# Patient Record
Sex: Female | Born: 1955 | Race: White | Hispanic: No | Marital: Married | Smoking: Former smoker
Health system: Southern US, Community
[De-identification: ages and names within clinical notes are randomized; demographics above are authoritative.]

## PROBLEM LIST (undated history)

## (undated) DIAGNOSIS — K219 Gastro-esophageal reflux disease without esophagitis: Secondary | ICD-10-CM

## (undated) DIAGNOSIS — E785 Hyperlipidemia, unspecified: Secondary | ICD-10-CM

## (undated) DIAGNOSIS — R42 Dizziness and giddiness: Secondary | ICD-10-CM

## (undated) DIAGNOSIS — T7840XA Allergy, unspecified, initial encounter: Secondary | ICD-10-CM

## (undated) DIAGNOSIS — N83209 Unspecified ovarian cyst, unspecified side: Secondary | ICD-10-CM

## (undated) DIAGNOSIS — L03818 Cellulitis of other sites: Secondary | ICD-10-CM

## (undated) DIAGNOSIS — G43009 Migraine without aura, not intractable, without status migrainosus: Secondary | ICD-10-CM

## (undated) DIAGNOSIS — T4145XA Adverse effect of unspecified anesthetic, initial encounter: Secondary | ICD-10-CM

## (undated) DIAGNOSIS — L409 Psoriasis, unspecified: Secondary | ICD-10-CM

## (undated) DIAGNOSIS — E119 Type 2 diabetes mellitus without complications: Secondary | ICD-10-CM

## (undated) DIAGNOSIS — A0472 Enterocolitis due to Clostridium difficile, not specified as recurrent: Secondary | ICD-10-CM

## (undated) DIAGNOSIS — I779 Disorder of arteries and arterioles, unspecified: Secondary | ICD-10-CM

## (undated) DIAGNOSIS — N189 Chronic kidney disease, unspecified: Secondary | ICD-10-CM

## (undated) DIAGNOSIS — Z87898 Personal history of other specified conditions: Secondary | ICD-10-CM

## (undated) DIAGNOSIS — M549 Dorsalgia, unspecified: Secondary | ICD-10-CM

## (undated) DIAGNOSIS — I498 Other specified cardiac arrhythmias: Secondary | ICD-10-CM

## (undated) DIAGNOSIS — Z5189 Encounter for other specified aftercare: Secondary | ICD-10-CM

## (undated) DIAGNOSIS — G8929 Other chronic pain: Secondary | ICD-10-CM

## (undated) DIAGNOSIS — N183 Chronic kidney disease, stage 3 (moderate): Secondary | ICD-10-CM

## (undated) DIAGNOSIS — L02818 Cutaneous abscess of other sites: Secondary | ICD-10-CM

## (undated) DIAGNOSIS — E039 Hypothyroidism, unspecified: Secondary | ICD-10-CM

## (undated) DIAGNOSIS — F329 Major depressive disorder, single episode, unspecified: Secondary | ICD-10-CM

## (undated) DIAGNOSIS — M1990 Unspecified osteoarthritis, unspecified site: Secondary | ICD-10-CM

## (undated) DIAGNOSIS — E538 Deficiency of other specified B group vitamins: Secondary | ICD-10-CM

## (undated) DIAGNOSIS — N179 Acute kidney failure, unspecified: Secondary | ICD-10-CM

## (undated) DIAGNOSIS — K7460 Unspecified cirrhosis of liver: Secondary | ICD-10-CM

## (undated) DIAGNOSIS — E669 Obesity, unspecified: Secondary | ICD-10-CM

## (undated) DIAGNOSIS — G5600 Carpal tunnel syndrome, unspecified upper limb: Secondary | ICD-10-CM

## (undated) DIAGNOSIS — I10 Essential (primary) hypertension: Secondary | ICD-10-CM

## (undated) DIAGNOSIS — M545 Low back pain, unspecified: Secondary | ICD-10-CM

## (undated) DIAGNOSIS — T8859XA Other complications of anesthesia, initial encounter: Secondary | ICD-10-CM

## (undated) DIAGNOSIS — J309 Allergic rhinitis, unspecified: Secondary | ICD-10-CM

## (undated) DIAGNOSIS — F411 Generalized anxiety disorder: Secondary | ICD-10-CM

## (undated) DIAGNOSIS — R1013 Epigastric pain: Secondary | ICD-10-CM

## (undated) DIAGNOSIS — R079 Chest pain, unspecified: Secondary | ICD-10-CM

## (undated) DIAGNOSIS — I639 Cerebral infarction, unspecified: Secondary | ICD-10-CM

## (undated) DIAGNOSIS — I739 Peripheral vascular disease, unspecified: Secondary | ICD-10-CM

## (undated) DIAGNOSIS — S8010XA Contusion of unspecified lower leg, initial encounter: Secondary | ICD-10-CM

## (undated) DIAGNOSIS — Z8679 Personal history of other diseases of the circulatory system: Secondary | ICD-10-CM

## (undated) DIAGNOSIS — G43909 Migraine, unspecified, not intractable, without status migrainosus: Secondary | ICD-10-CM

## (undated) HISTORY — DX: Gastro-esophageal reflux disease without esophagitis: K21.9

## (undated) HISTORY — DX: Chest pain, unspecified: R07.9

## (undated) HISTORY — DX: Personal history of other specified conditions: Z87.898

## (undated) HISTORY — DX: Epigastric pain: R10.13

## (undated) HISTORY — DX: Cellulitis of other sites: L03.818

## (undated) HISTORY — DX: Chronic kidney disease, unspecified: N18.9

## (undated) HISTORY — DX: Personal history of other diseases of the circulatory system: Z86.79

## (undated) HISTORY — DX: Type 2 diabetes mellitus without complications: E11.9

## (undated) HISTORY — DX: Unspecified cirrhosis of liver: K74.60

## (undated) HISTORY — DX: Deficiency of other specified B group vitamins: E53.8

## (undated) HISTORY — DX: Enterocolitis due to Clostridium difficile, not specified as recurrent: A04.72

## (undated) HISTORY — DX: Allergy, unspecified, initial encounter: T78.40XA

## (undated) HISTORY — DX: Psoriasis, unspecified: L40.9

## (undated) HISTORY — PX: CHOLECYSTECTOMY: SHX55

## (undated) HISTORY — DX: Migraine without aura, not intractable, without status migrainosus: G43.009

## (undated) HISTORY — DX: Essential (primary) hypertension: I10

## (undated) HISTORY — DX: Hyperlipidemia, unspecified: E78.5

## (undated) HISTORY — DX: Hypothyroidism, unspecified: E03.9

## (undated) HISTORY — DX: Cutaneous abscess of other sites: L02.818

## (undated) HISTORY — DX: Encounter for other specified aftercare: Z51.89

## (undated) HISTORY — DX: Cerebral infarction, unspecified: I63.9

## (undated) HISTORY — DX: Generalized anxiety disorder: F41.1

## (undated) HISTORY — DX: Carpal tunnel syndrome, unspecified upper limb: G56.00

## (undated) HISTORY — DX: Disorder of arteries and arterioles, unspecified: I77.9

## (undated) HISTORY — DX: Chronic kidney disease, stage 3 (moderate): N18.3

## (undated) HISTORY — PX: COLONOSCOPY: SHX174

## (undated) HISTORY — DX: Unspecified ovarian cyst, unspecified side: N83.209

## (undated) HISTORY — DX: Other specified cardiac arrhythmias: I49.8

## (undated) HISTORY — DX: Major depressive disorder, single episode, unspecified: F32.9

## (undated) HISTORY — DX: Migraine, unspecified, not intractable, without status migrainosus: G43.909

## (undated) HISTORY — DX: Peripheral vascular disease, unspecified: I73.9

## (undated) HISTORY — DX: Dizziness and giddiness: R42

## (undated) HISTORY — DX: Dorsalgia, unspecified: M54.9

## (undated) HISTORY — DX: Contusion of unspecified lower leg, initial encounter: S80.10XA

## (undated) HISTORY — DX: Allergic rhinitis, unspecified: J30.9

## (undated) HISTORY — DX: Obesity, unspecified: E66.9

---

## 1997-05-21 ENCOUNTER — Emergency Department (HOSPITAL_COMMUNITY): Admission: EM | Admit: 1997-05-21 | Discharge: 1997-05-21 | Payer: Self-pay | Admitting: Emergency Medicine

## 1997-11-01 ENCOUNTER — Other Ambulatory Visit: Admission: RE | Admit: 1997-11-01 | Discharge: 1997-11-01 | Payer: Self-pay | Admitting: *Deleted

## 1997-11-01 ENCOUNTER — Ambulatory Visit: Admission: RE | Admit: 1997-11-01 | Discharge: 1997-11-01 | Payer: Self-pay | Admitting: Internal Medicine

## 1998-11-02 ENCOUNTER — Other Ambulatory Visit: Admission: RE | Admit: 1998-11-02 | Discharge: 1998-11-02 | Payer: Self-pay | Admitting: *Deleted

## 1998-11-03 ENCOUNTER — Emergency Department (HOSPITAL_COMMUNITY): Admission: EM | Admit: 1998-11-03 | Discharge: 1998-11-03 | Payer: Self-pay | Admitting: Emergency Medicine

## 1999-11-23 ENCOUNTER — Other Ambulatory Visit: Admission: RE | Admit: 1999-11-23 | Discharge: 1999-11-23 | Payer: Self-pay | Admitting: *Deleted

## 2000-12-10 ENCOUNTER — Other Ambulatory Visit: Admission: RE | Admit: 2000-12-10 | Discharge: 2000-12-10 | Payer: Self-pay | Admitting: *Deleted

## 2001-04-09 ENCOUNTER — Encounter: Payer: Self-pay | Admitting: *Deleted

## 2001-04-09 ENCOUNTER — Inpatient Hospital Stay (HOSPITAL_COMMUNITY): Admission: EM | Admit: 2001-04-09 | Discharge: 2001-04-10 | Payer: Self-pay | Admitting: *Deleted

## 2001-12-31 ENCOUNTER — Other Ambulatory Visit: Admission: RE | Admit: 2001-12-31 | Discharge: 2001-12-31 | Payer: Self-pay | Admitting: *Deleted

## 2002-01-13 ENCOUNTER — Emergency Department (HOSPITAL_COMMUNITY): Admission: EM | Admit: 2002-01-13 | Discharge: 2002-01-13 | Payer: Self-pay | Admitting: *Deleted

## 2002-01-13 ENCOUNTER — Encounter: Payer: Self-pay | Admitting: *Deleted

## 2002-01-28 ENCOUNTER — Ambulatory Visit (HOSPITAL_COMMUNITY): Admission: RE | Admit: 2002-01-28 | Discharge: 2002-01-28 | Payer: Self-pay | Admitting: Gastroenterology

## 2002-01-28 ENCOUNTER — Encounter: Payer: Self-pay | Admitting: Gastroenterology

## 2002-01-31 ENCOUNTER — Encounter: Payer: Self-pay | Admitting: Emergency Medicine

## 2002-01-31 ENCOUNTER — Inpatient Hospital Stay (HOSPITAL_COMMUNITY): Admission: EM | Admit: 2002-01-31 | Discharge: 2002-02-02 | Payer: Self-pay | Admitting: Emergency Medicine

## 2002-02-01 ENCOUNTER — Encounter: Payer: Self-pay | Admitting: General Surgery

## 2002-02-02 ENCOUNTER — Encounter (INDEPENDENT_AMBULATORY_CARE_PROVIDER_SITE_OTHER): Payer: Self-pay | Admitting: *Deleted

## 2002-02-02 ENCOUNTER — Encounter: Payer: Self-pay | Admitting: Cardiovascular Disease

## 2003-01-04 ENCOUNTER — Other Ambulatory Visit: Admission: RE | Admit: 2003-01-04 | Discharge: 2003-01-04 | Payer: Self-pay | Admitting: *Deleted

## 2003-02-20 HISTORY — PX: NECK SURGERY: SHX720

## 2003-02-20 HISTORY — PX: ANTERIOR CERVICAL DECOMP/DISCECTOMY FUSION: SHX1161

## 2003-07-26 ENCOUNTER — Ambulatory Visit (HOSPITAL_COMMUNITY): Admission: RE | Admit: 2003-07-26 | Discharge: 2003-07-26 | Payer: Self-pay | Admitting: Obstetrics & Gynecology

## 2003-07-26 ENCOUNTER — Encounter (INDEPENDENT_AMBULATORY_CARE_PROVIDER_SITE_OTHER): Payer: Self-pay | Admitting: Specialist

## 2003-09-17 ENCOUNTER — Ambulatory Visit (HOSPITAL_COMMUNITY): Admission: RE | Admit: 2003-09-17 | Discharge: 2003-09-17 | Payer: Self-pay | Admitting: Internal Medicine

## 2003-10-18 ENCOUNTER — Encounter: Payer: Self-pay | Admitting: Gastroenterology

## 2003-10-18 ENCOUNTER — Encounter: Payer: Self-pay | Admitting: Internal Medicine

## 2003-10-18 LAB — HM COLONOSCOPY

## 2003-11-12 ENCOUNTER — Ambulatory Visit (HOSPITAL_COMMUNITY): Admission: RE | Admit: 2003-11-12 | Discharge: 2003-11-13 | Payer: Self-pay | Admitting: Neurosurgery

## 2004-01-11 ENCOUNTER — Encounter: Admission: RE | Admit: 2004-01-11 | Discharge: 2004-01-11 | Payer: Self-pay | Admitting: Neurosurgery

## 2004-01-25 ENCOUNTER — Other Ambulatory Visit: Admission: RE | Admit: 2004-01-25 | Discharge: 2004-01-25 | Payer: Self-pay | Admitting: Obstetrics & Gynecology

## 2004-02-23 ENCOUNTER — Ambulatory Visit: Payer: Self-pay | Admitting: Gastroenterology

## 2004-03-01 ENCOUNTER — Ambulatory Visit: Payer: Self-pay | Admitting: Gastroenterology

## 2004-04-04 ENCOUNTER — Ambulatory Visit: Payer: Self-pay | Admitting: Internal Medicine

## 2004-04-04 ENCOUNTER — Ambulatory Visit: Payer: Self-pay | Admitting: Gastroenterology

## 2004-04-25 ENCOUNTER — Ambulatory Visit: Payer: Self-pay | Admitting: Internal Medicine

## 2004-07-07 ENCOUNTER — Encounter: Admission: RE | Admit: 2004-07-07 | Discharge: 2004-07-07 | Payer: Self-pay | Admitting: Neurosurgery

## 2004-07-14 ENCOUNTER — Ambulatory Visit: Payer: Self-pay | Admitting: Internal Medicine

## 2004-09-19 ENCOUNTER — Ambulatory Visit: Payer: Self-pay | Admitting: Internal Medicine

## 2004-11-25 ENCOUNTER — Emergency Department (HOSPITAL_COMMUNITY): Admission: EM | Admit: 2004-11-25 | Discharge: 2004-11-25 | Payer: Self-pay | Admitting: Emergency Medicine

## 2004-11-26 ENCOUNTER — Ambulatory Visit: Payer: Self-pay | Admitting: Critical Care Medicine

## 2004-11-26 ENCOUNTER — Inpatient Hospital Stay (HOSPITAL_COMMUNITY): Admission: EM | Admit: 2004-11-26 | Discharge: 2004-12-14 | Payer: Self-pay | Admitting: Emergency Medicine

## 2004-11-27 ENCOUNTER — Encounter (INDEPENDENT_AMBULATORY_CARE_PROVIDER_SITE_OTHER): Payer: Self-pay | Admitting: *Deleted

## 2004-11-30 ENCOUNTER — Encounter: Payer: Self-pay | Admitting: Critical Care Medicine

## 2004-12-10 ENCOUNTER — Ambulatory Visit: Payer: Self-pay | Admitting: Internal Medicine

## 2004-12-18 ENCOUNTER — Ambulatory Visit: Payer: Self-pay | Admitting: Internal Medicine

## 2004-12-20 ENCOUNTER — Ambulatory Visit: Payer: Self-pay | Admitting: Internal Medicine

## 2004-12-22 ENCOUNTER — Ambulatory Visit: Payer: Self-pay | Admitting: Internal Medicine

## 2004-12-22 ENCOUNTER — Inpatient Hospital Stay (HOSPITAL_COMMUNITY): Admission: EM | Admit: 2004-12-22 | Discharge: 2004-12-25 | Payer: Self-pay | Admitting: Emergency Medicine

## 2004-12-26 ENCOUNTER — Emergency Department (HOSPITAL_COMMUNITY): Admission: EM | Admit: 2004-12-26 | Discharge: 2004-12-26 | Payer: Self-pay | Admitting: Emergency Medicine

## 2004-12-27 ENCOUNTER — Ambulatory Visit: Payer: Self-pay | Admitting: Internal Medicine

## 2005-01-01 ENCOUNTER — Ambulatory Visit: Payer: Self-pay | Admitting: Internal Medicine

## 2005-01-02 ENCOUNTER — Ambulatory Visit: Payer: Self-pay | Admitting: Internal Medicine

## 2005-01-16 ENCOUNTER — Inpatient Hospital Stay (HOSPITAL_COMMUNITY): Admission: EM | Admit: 2005-01-16 | Discharge: 2005-01-19 | Payer: Self-pay | Admitting: Emergency Medicine

## 2005-01-16 ENCOUNTER — Ambulatory Visit: Payer: Self-pay | Admitting: Internal Medicine

## 2005-02-21 ENCOUNTER — Ambulatory Visit: Payer: Self-pay | Admitting: Internal Medicine

## 2005-03-08 ENCOUNTER — Other Ambulatory Visit: Admission: RE | Admit: 2005-03-08 | Discharge: 2005-03-08 | Payer: Self-pay | Admitting: Obstetrics & Gynecology

## 2005-03-22 ENCOUNTER — Ambulatory Visit: Payer: Self-pay | Admitting: Internal Medicine

## 2005-04-26 ENCOUNTER — Ambulatory Visit: Payer: Self-pay | Admitting: Internal Medicine

## 2005-05-22 ENCOUNTER — Ambulatory Visit: Payer: Self-pay | Admitting: Internal Medicine

## 2005-05-30 ENCOUNTER — Ambulatory Visit: Payer: Self-pay | Admitting: Gastroenterology

## 2005-06-06 ENCOUNTER — Ambulatory Visit: Payer: Self-pay | Admitting: Gastroenterology

## 2005-06-07 ENCOUNTER — Encounter (INDEPENDENT_AMBULATORY_CARE_PROVIDER_SITE_OTHER): Payer: Self-pay | Admitting: Specialist

## 2005-06-07 ENCOUNTER — Observation Stay (HOSPITAL_COMMUNITY): Admission: AD | Admit: 2005-06-07 | Discharge: 2005-06-08 | Payer: Self-pay | Admitting: Obstetrics and Gynecology

## 2005-09-18 ENCOUNTER — Ambulatory Visit: Payer: Self-pay | Admitting: Internal Medicine

## 2005-10-01 ENCOUNTER — Ambulatory Visit: Payer: Self-pay | Admitting: Internal Medicine

## 2005-10-17 ENCOUNTER — Ambulatory Visit: Payer: Self-pay | Admitting: Internal Medicine

## 2005-10-18 ENCOUNTER — Ambulatory Visit: Payer: Self-pay | Admitting: Internal Medicine

## 2005-11-29 ENCOUNTER — Ambulatory Visit: Payer: Self-pay | Admitting: Internal Medicine

## 2005-12-21 ENCOUNTER — Encounter: Admission: RE | Admit: 2005-12-21 | Discharge: 2005-12-21 | Payer: Self-pay | Admitting: Internal Medicine

## 2005-12-28 ENCOUNTER — Ambulatory Visit: Payer: Self-pay | Admitting: Internal Medicine

## 2005-12-31 ENCOUNTER — Inpatient Hospital Stay (HOSPITAL_COMMUNITY): Admission: AD | Admit: 2005-12-31 | Discharge: 2005-12-31 | Payer: Self-pay | Admitting: Obstetrics & Gynecology

## 2006-01-15 ENCOUNTER — Ambulatory Visit: Payer: Self-pay | Admitting: Internal Medicine

## 2006-01-22 ENCOUNTER — Ambulatory Visit: Payer: Self-pay | Admitting: Internal Medicine

## 2006-02-04 LAB — HM MAMMOGRAPHY: HM Mammogram: NORMAL

## 2006-02-19 HISTORY — PX: ABDOMINAL HYSTERECTOMY: SHX81

## 2006-03-20 ENCOUNTER — Ambulatory Visit (HOSPITAL_COMMUNITY): Admission: RE | Admit: 2006-03-20 | Discharge: 2006-03-21 | Payer: Self-pay | Admitting: Obstetrics and Gynecology

## 2006-03-20 ENCOUNTER — Encounter (INDEPENDENT_AMBULATORY_CARE_PROVIDER_SITE_OTHER): Payer: Self-pay | Admitting: Specialist

## 2006-03-29 ENCOUNTER — Encounter: Payer: Self-pay | Admitting: Gastroenterology

## 2006-06-10 ENCOUNTER — Ambulatory Visit: Payer: Self-pay | Admitting: Internal Medicine

## 2006-06-10 LAB — CONVERTED CEMR LAB
ALT: 8 units/L (ref 0–40)
AST: 19 units/L (ref 0–37)
Albumin: 3.9 g/dL (ref 3.5–5.2)
Alkaline Phosphatase: 75 units/L (ref 39–117)
BUN: 17 mg/dL (ref 6–23)
Basophils Absolute: 0.1 10*3/uL (ref 0.0–0.1)
Basophils Relative: 0.8 % (ref 0.0–1.0)
Bilirubin Urine: NEGATIVE
Bilirubin, Direct: 0.3 mg/dL (ref 0.0–0.3)
CO2: 30 meq/L (ref 19–32)
Calcium: 9 mg/dL (ref 8.4–10.5)
Chloride: 104 meq/L (ref 96–112)
Creatinine, Ser: 1.3 mg/dL — ABNORMAL HIGH (ref 0.4–1.2)
Crystals: NEGATIVE
Eosinophils Absolute: 0.2 10*3/uL (ref 0.0–0.6)
Eosinophils Relative: 2.5 % (ref 0.0–5.0)
GFR calc Af Amer: 56 mL/min
GFR calc non Af Amer: 46 mL/min
Glucose, Bld: 85 mg/dL (ref 70–99)
HCT: 37.9 % (ref 36.0–46.0)
Hemoglobin: 13.2 g/dL (ref 12.0–15.0)
Ketones, ur: NEGATIVE mg/dL
Lymphocytes Relative: 22.6 % (ref 12.0–46.0)
MCHC: 34.9 g/dL (ref 30.0–36.0)
MCV: 87.9 fL (ref 78.0–100.0)
Monocytes Absolute: 0.6 10*3/uL (ref 0.2–0.7)
Monocytes Relative: 6.5 % (ref 3.0–11.0)
Mucus, UA: NEGATIVE
Neutro Abs: 6.7 10*3/uL (ref 1.4–7.7)
Neutrophils Relative %: 67.6 % (ref 43.0–77.0)
Nitrite: NEGATIVE
Platelets: 257 10*3/uL (ref 150–400)
Potassium: 3.4 meq/L — ABNORMAL LOW (ref 3.5–5.1)
RBC / HPF: NONE SEEN
RBC: 4.32 M/uL (ref 3.87–5.11)
RDW: 12.6 % (ref 11.5–14.6)
Sodium: 142 meq/L (ref 135–145)
Specific Gravity, Urine: 1.02 (ref 1.000–1.03)
TSH: 0.14 microintl units/mL — ABNORMAL LOW (ref 0.35–5.50)
Total Bilirubin: 0.7 mg/dL (ref 0.3–1.2)
Total Protein, Urine: NEGATIVE mg/dL
Total Protein: 6.4 g/dL (ref 6.0–8.3)
Urine Glucose: NEGATIVE mg/dL
Urobilinogen, UA: 0.2 (ref 0.0–1.0)
WBC: 9.8 10*3/uL (ref 4.5–10.5)
pH: 5.5 (ref 5.0–8.0)

## 2006-07-14 ENCOUNTER — Emergency Department (HOSPITAL_COMMUNITY): Admission: EM | Admit: 2006-07-14 | Discharge: 2006-07-14 | Payer: Self-pay | Admitting: Emergency Medicine

## 2006-08-15 ENCOUNTER — Encounter: Payer: Self-pay | Admitting: Internal Medicine

## 2006-08-15 DIAGNOSIS — I10 Essential (primary) hypertension: Secondary | ICD-10-CM

## 2006-08-15 DIAGNOSIS — E039 Hypothyroidism, unspecified: Secondary | ICD-10-CM | POA: Insufficient documentation

## 2006-08-15 HISTORY — DX: Hypothyroidism, unspecified: E03.9

## 2006-08-15 HISTORY — DX: Essential (primary) hypertension: I10

## 2006-10-14 ENCOUNTER — Ambulatory Visit: Payer: Self-pay | Admitting: Internal Medicine

## 2006-10-15 ENCOUNTER — Encounter: Payer: Self-pay | Admitting: Internal Medicine

## 2006-10-16 DIAGNOSIS — F411 Generalized anxiety disorder: Secondary | ICD-10-CM

## 2006-10-16 DIAGNOSIS — N183 Chronic kidney disease, stage 3 unspecified: Secondary | ICD-10-CM | POA: Insufficient documentation

## 2006-10-16 DIAGNOSIS — E669 Obesity, unspecified: Secondary | ICD-10-CM

## 2006-10-16 DIAGNOSIS — K7460 Unspecified cirrhosis of liver: Secondary | ICD-10-CM | POA: Insufficient documentation

## 2006-10-16 DIAGNOSIS — F32A Depression, unspecified: Secondary | ICD-10-CM | POA: Insufficient documentation

## 2006-10-16 DIAGNOSIS — F329 Major depressive disorder, single episode, unspecified: Secondary | ICD-10-CM | POA: Insufficient documentation

## 2006-10-16 DIAGNOSIS — G5600 Carpal tunnel syndrome, unspecified upper limb: Secondary | ICD-10-CM

## 2006-10-16 DIAGNOSIS — A001 Cholera due to Vibrio cholerae 01, biovar eltor: Secondary | ICD-10-CM | POA: Insufficient documentation

## 2006-10-16 DIAGNOSIS — G43909 Migraine, unspecified, not intractable, without status migrainosus: Secondary | ICD-10-CM

## 2006-10-16 DIAGNOSIS — F3289 Other specified depressive episodes: Secondary | ICD-10-CM

## 2006-10-16 HISTORY — DX: Migraine, unspecified, not intractable, without status migrainosus: G43.909

## 2006-10-16 HISTORY — DX: Other specified depressive episodes: F32.89

## 2006-10-16 HISTORY — DX: Carpal tunnel syndrome, unspecified upper limb: G56.00

## 2006-10-16 HISTORY — DX: Chronic kidney disease, stage 3 unspecified: N18.30

## 2006-10-16 HISTORY — DX: Unspecified cirrhosis of liver: K74.60

## 2006-10-16 HISTORY — DX: Major depressive disorder, single episode, unspecified: F32.9

## 2006-10-16 HISTORY — DX: Obesity, unspecified: E66.9

## 2006-10-16 HISTORY — DX: Generalized anxiety disorder: F41.1

## 2006-11-26 ENCOUNTER — Ambulatory Visit: Payer: Self-pay | Admitting: Internal Medicine

## 2006-11-26 LAB — CONVERTED CEMR LAB
ALT: 15 units/L (ref 0–35)
AST: 13 units/L (ref 0–37)
Albumin: 3.9 g/dL (ref 3.5–5.2)
Alkaline Phosphatase: 73 units/L (ref 39–117)
BUN: 15 mg/dL (ref 6–23)
Basophils Absolute: 0 10*3/uL (ref 0.0–0.1)
Basophils Relative: 0.4 % (ref 0.0–1.0)
Bilirubin, Direct: 0.1 mg/dL (ref 0.0–0.3)
CO2: 29 meq/L (ref 19–32)
Calcium: 9.1 mg/dL (ref 8.4–10.5)
Chloride: 105 meq/L (ref 96–112)
Creatinine, Ser: 1.2 mg/dL (ref 0.4–1.2)
Eosinophils Absolute: 0.2 10*3/uL (ref 0.0–0.6)
Eosinophils Relative: 2.4 % (ref 0.0–5.0)
Folate: 10.5 ng/mL
GFR calc Af Amer: 61 mL/min
GFR calc non Af Amer: 51 mL/min
Glucose, Bld: 95 mg/dL (ref 70–99)
HCT: 40.1 % (ref 36.0–46.0)
Hemoglobin: 13.9 g/dL (ref 12.0–15.0)
Iron: 95 ug/dL (ref 42–145)
Lymphocytes Relative: 22.2 % (ref 12.0–46.0)
MCHC: 34.8 g/dL (ref 30.0–36.0)
MCV: 92.1 fL (ref 78.0–100.0)
Monocytes Absolute: 0.4 10*3/uL (ref 0.2–0.7)
Monocytes Relative: 5.3 % (ref 3.0–11.0)
Neutro Abs: 5.5 10*3/uL (ref 1.4–7.7)
Neutrophils Relative %: 69.7 % (ref 43.0–77.0)
Platelets: 209 10*3/uL (ref 150–400)
Potassium: 3.4 meq/L — ABNORMAL LOW (ref 3.5–5.1)
RBC: 4.35 M/uL (ref 3.87–5.11)
RDW: 11.8 % (ref 11.5–14.6)
Saturation Ratios: 21.8 % (ref 20.0–50.0)
Sodium: 142 meq/L (ref 135–145)
TSH: 2.21 microintl units/mL (ref 0.35–5.50)
Total Bilirubin: 0.7 mg/dL (ref 0.3–1.2)
Total Protein: 6.9 g/dL (ref 6.0–8.3)
Transferrin: 311.7 mg/dL (ref >=212.0)
Vitamin B-12: 177 pg/mL — ABNORMAL LOW (ref 211–911)
WBC: 7.9 10*3/uL (ref 4.5–10.5)

## 2006-12-06 ENCOUNTER — Encounter: Payer: Self-pay | Admitting: Internal Medicine

## 2006-12-06 ENCOUNTER — Ambulatory Visit: Payer: Self-pay

## 2006-12-09 ENCOUNTER — Encounter: Payer: Self-pay | Admitting: Internal Medicine

## 2006-12-10 ENCOUNTER — Ambulatory Visit: Payer: Self-pay | Admitting: Internal Medicine

## 2006-12-20 ENCOUNTER — Ambulatory Visit: Payer: Self-pay | Admitting: Internal Medicine

## 2006-12-20 ENCOUNTER — Encounter: Payer: Self-pay | Admitting: Internal Medicine

## 2006-12-20 DIAGNOSIS — N83209 Unspecified ovarian cyst, unspecified side: Secondary | ICD-10-CM | POA: Insufficient documentation

## 2006-12-20 DIAGNOSIS — R1013 Epigastric pain: Secondary | ICD-10-CM | POA: Insufficient documentation

## 2006-12-20 DIAGNOSIS — G43009 Migraine without aura, not intractable, without status migrainosus: Secondary | ICD-10-CM

## 2006-12-20 DIAGNOSIS — Z87898 Personal history of other specified conditions: Secondary | ICD-10-CM | POA: Insufficient documentation

## 2006-12-20 DIAGNOSIS — R5383 Other fatigue: Secondary | ICD-10-CM

## 2006-12-20 DIAGNOSIS — Z8679 Personal history of other diseases of the circulatory system: Secondary | ICD-10-CM | POA: Insufficient documentation

## 2006-12-20 DIAGNOSIS — R5381 Other malaise: Secondary | ICD-10-CM | POA: Insufficient documentation

## 2006-12-20 HISTORY — DX: Personal history of other diseases of the circulatory system: Z86.79

## 2006-12-20 HISTORY — DX: Migraine without aura, not intractable, without status migrainosus: G43.009

## 2006-12-20 HISTORY — DX: Epigastric pain: R10.13

## 2006-12-20 HISTORY — DX: Unspecified ovarian cyst, unspecified side: N83.209

## 2006-12-20 HISTORY — DX: Personal history of other specified conditions: Z87.898

## 2006-12-20 LAB — CONVERTED CEMR LAB
ALT: 16 units/L (ref 0–35)
AST: 15 units/L (ref 0–37)
Albumin: 4 g/dL (ref 3.5–5.2)
Alkaline Phosphatase: 68 units/L (ref 39–117)
BUN: 15 mg/dL (ref 6–23)
Bacteria, UA: NEGATIVE
Basophils Absolute: 0 10*3/uL (ref 0.0–0.1)
Basophils Relative: 0.2 % (ref 0.0–1.0)
Bilirubin Urine: NEGATIVE
Bilirubin, Direct: 0.1 mg/dL (ref 0.0–0.3)
CO2: 28 meq/L (ref 19–32)
Calcium: 9.2 mg/dL (ref 8.4–10.5)
Chloride: 109 meq/L (ref 96–112)
Creatinine, Ser: 1.3 mg/dL — ABNORMAL HIGH (ref 0.4–1.2)
Crystals: NEGATIVE
Eosinophils Absolute: 0.2 10*3/uL (ref 0.0–0.6)
Eosinophils Relative: 2.2 % (ref 0.0–5.0)
GFR calc Af Amer: 56 mL/min
GFR calc non Af Amer: 46 mL/min
Glucose, Bld: 101 mg/dL — ABNORMAL HIGH (ref 70–99)
H Pylori IgG: NEGATIVE
HCT: 41.1 % (ref 36.0–46.0)
Hemoglobin: 14.8 g/dL (ref 12.0–15.0)
Ketones, ur: NEGATIVE mg/dL
Leukocytes, UA: NEGATIVE
Lymphocytes Relative: 20 % (ref 12.0–46.0)
MCHC: 36 g/dL (ref 30.0–36.0)
MCV: 92.3 fL (ref 78.0–100.0)
Monocytes Absolute: 0.4 10*3/uL (ref 0.2–0.7)
Monocytes Relative: 4.7 % (ref 3.0–11.0)
Mucus, UA: NEGATIVE
Neutro Abs: 6 10*3/uL (ref 1.4–7.7)
Neutrophils Relative %: 72.9 % (ref 43.0–77.0)
Nitrite: NEGATIVE
Platelets: 214 10*3/uL (ref 150–400)
Potassium: 3.8 meq/L (ref 3.5–5.1)
RBC: 4.45 M/uL (ref 3.87–5.11)
RDW: 12.1 % (ref 11.5–14.6)
Sodium: 144 meq/L (ref 135–145)
Specific Gravity, Urine: 1.015 (ref 1.000–1.03)
Total Bilirubin: 0.8 mg/dL (ref 0.3–1.2)
Total Protein, Urine: NEGATIVE mg/dL
Total Protein: 7 g/dL (ref 6.0–8.3)
Urine Glucose: NEGATIVE mg/dL
Urobilinogen, UA: 0.2 (ref 0.0–1.0)
WBC: 8.2 10*3/uL (ref 4.5–10.5)
pH: 5.5 (ref 5.0–8.0)

## 2006-12-25 DIAGNOSIS — E119 Type 2 diabetes mellitus without complications: Secondary | ICD-10-CM | POA: Insufficient documentation

## 2006-12-25 DIAGNOSIS — J309 Allergic rhinitis, unspecified: Secondary | ICD-10-CM

## 2006-12-25 DIAGNOSIS — E785 Hyperlipidemia, unspecified: Secondary | ICD-10-CM | POA: Insufficient documentation

## 2006-12-25 DIAGNOSIS — K219 Gastro-esophageal reflux disease without esophagitis: Secondary | ICD-10-CM | POA: Insufficient documentation

## 2006-12-25 HISTORY — DX: Allergic rhinitis, unspecified: J30.9

## 2006-12-25 HISTORY — DX: Gastro-esophageal reflux disease without esophagitis: K21.9

## 2006-12-25 HISTORY — DX: Hyperlipidemia, unspecified: E78.5

## 2006-12-31 ENCOUNTER — Encounter: Payer: Self-pay | Admitting: Internal Medicine

## 2007-01-09 ENCOUNTER — Ambulatory Visit: Payer: Self-pay | Admitting: Internal Medicine

## 2007-01-09 DIAGNOSIS — E538 Deficiency of other specified B group vitamins: Secondary | ICD-10-CM

## 2007-01-09 HISTORY — DX: Deficiency of other specified B group vitamins: E53.8

## 2007-01-20 ENCOUNTER — Ambulatory Visit: Payer: Self-pay | Admitting: Gastroenterology

## 2007-01-21 ENCOUNTER — Ambulatory Visit: Payer: Self-pay | Admitting: Gastroenterology

## 2007-01-27 ENCOUNTER — Telehealth (INDEPENDENT_AMBULATORY_CARE_PROVIDER_SITE_OTHER): Payer: Self-pay | Admitting: *Deleted

## 2007-01-27 ENCOUNTER — Encounter: Payer: Self-pay | Admitting: Internal Medicine

## 2007-01-27 DIAGNOSIS — R300 Dysuria: Secondary | ICD-10-CM | POA: Insufficient documentation

## 2007-01-28 ENCOUNTER — Ambulatory Visit: Payer: Self-pay | Admitting: Internal Medicine

## 2007-01-29 LAB — CONVERTED CEMR LAB
Bilirubin Urine: NEGATIVE
Crystals: NEGATIVE
Ketones, ur: NEGATIVE mg/dL
Leukocytes, UA: NEGATIVE
Mucus, UA: NEGATIVE
Nitrite: NEGATIVE
RBC / HPF: NONE SEEN
Specific Gravity, Urine: 1.02 (ref 1.000–1.03)
Total Protein, Urine: NEGATIVE mg/dL
Urine Glucose: NEGATIVE mg/dL
Urobilinogen, UA: 0.2 (ref 0.0–1.0)
pH: 6 (ref 5.0–8.0)

## 2007-02-20 DIAGNOSIS — N179 Acute kidney failure, unspecified: Secondary | ICD-10-CM

## 2007-02-20 HISTORY — DX: Acute kidney failure, unspecified: N17.9

## 2007-02-21 ENCOUNTER — Ambulatory Visit: Payer: Self-pay | Admitting: Internal Medicine

## 2007-03-10 ENCOUNTER — Encounter: Payer: Self-pay | Admitting: Internal Medicine

## 2007-03-13 DIAGNOSIS — K209 Esophagitis, unspecified without bleeding: Secondary | ICD-10-CM | POA: Insufficient documentation

## 2007-06-30 ENCOUNTER — Ambulatory Visit: Payer: Self-pay | Admitting: Internal Medicine

## 2007-06-30 LAB — CONVERTED CEMR LAB
Bilirubin Urine: NEGATIVE
Ketones, ur: NEGATIVE mg/dL
Leukocytes, UA: NEGATIVE
Nitrite: NEGATIVE
Specific Gravity, Urine: 1.02 (ref 1.000–1.03)
Total Protein, Urine: NEGATIVE mg/dL
Urine Glucose: NEGATIVE mg/dL
Urobilinogen, UA: 0.2 (ref 0.0–1.0)
pH: 6 (ref 5.0–8.0)

## 2007-07-24 ENCOUNTER — Encounter: Payer: Self-pay | Admitting: Internal Medicine

## 2007-07-31 ENCOUNTER — Encounter: Payer: Self-pay | Admitting: Internal Medicine

## 2007-09-11 ENCOUNTER — Emergency Department (HOSPITAL_COMMUNITY): Admission: EM | Admit: 2007-09-11 | Discharge: 2007-09-11 | Payer: Self-pay | Admitting: Emergency Medicine

## 2007-09-15 ENCOUNTER — Ambulatory Visit: Payer: Self-pay | Admitting: Internal Medicine

## 2007-09-15 DIAGNOSIS — H6590 Unspecified nonsuppurative otitis media, unspecified ear: Secondary | ICD-10-CM | POA: Insufficient documentation

## 2007-09-15 DIAGNOSIS — R42 Dizziness and giddiness: Secondary | ICD-10-CM

## 2007-09-15 HISTORY — DX: Dizziness and giddiness: R42

## 2007-10-14 ENCOUNTER — Ambulatory Visit: Payer: Self-pay | Admitting: Internal Medicine

## 2007-10-14 DIAGNOSIS — G459 Transient cerebral ischemic attack, unspecified: Secondary | ICD-10-CM | POA: Insufficient documentation

## 2007-10-15 ENCOUNTER — Encounter: Payer: Self-pay | Admitting: Internal Medicine

## 2007-10-15 ENCOUNTER — Telehealth (INDEPENDENT_AMBULATORY_CARE_PROVIDER_SITE_OTHER): Payer: Self-pay | Admitting: *Deleted

## 2007-10-15 LAB — CONVERTED CEMR LAB
ALT: 36 units/L — ABNORMAL HIGH (ref 0–35)
AST: 27 units/L (ref 0–37)
Albumin: 4.4 g/dL (ref 3.5–5.2)
Alkaline Phosphatase: 69 units/L (ref 39–117)
BUN: 20 mg/dL (ref 6–23)
Bacteria, UA: NEGATIVE
Basophils Absolute: 0.2 10*3/uL — ABNORMAL HIGH (ref 0.0–0.1)
Basophils Relative: 2.2 % (ref 0.0–3.0)
Bilirubin Urine: NEGATIVE
Bilirubin, Direct: 0.1 mg/dL (ref 0.0–0.3)
CO2: 31 meq/L (ref 19–32)
Calcium: 9.5 mg/dL (ref 8.4–10.5)
Chloride: 105 meq/L (ref 96–112)
Cholesterol: 267 mg/dL (ref 0–200)
Creatinine, Ser: 1.5 mg/dL — ABNORMAL HIGH (ref 0.4–1.2)
Crystals: NEGATIVE
Direct LDL: 160.1 mg/dL
Eosinophils Absolute: 0.3 10*3/uL (ref 0.0–0.7)
Eosinophils Relative: 2.7 % (ref 0.0–5.0)
GFR calc Af Amer: 47 mL/min
GFR calc non Af Amer: 39 mL/min
Glucose, Bld: 117 mg/dL — ABNORMAL HIGH (ref 70–99)
HCT: 45.5 % (ref 36.0–46.0)
HDL: 41.4 mg/dL (ref >39.0)
Hemoglobin: 15.9 g/dL — ABNORMAL HIGH (ref 12.0–15.0)
Hgb A1c MFr Bld: 5.9 % (ref 4.6–6.0)
Ketones, ur: NEGATIVE mg/dL
Leukocytes, UA: NEGATIVE
Lymphocytes Relative: 21.3 % (ref 12.0–46.0)
MCHC: 35 g/dL (ref 30.0–36.0)
MCV: 92.8 fL (ref 78.0–100.0)
Monocytes Absolute: 0.3 10*3/uL (ref 0.1–1.0)
Monocytes Relative: 3.3 % (ref 3.0–12.0)
Mucus, UA: NEGATIVE
Neutro Abs: 6.6 10*3/uL (ref 1.4–7.7)
Neutrophils Relative %: 70.5 % (ref 43.0–77.0)
Nitrite: NEGATIVE
Platelets: 214 10*3/uL (ref 150–400)
Potassium: 3.5 meq/L (ref 3.5–5.1)
RBC: 4.9 M/uL (ref 3.87–5.11)
RDW: 12.6 % (ref 11.5–14.6)
Sodium: 143 meq/L (ref 135–145)
Specific Gravity, Urine: 1.015 (ref 1.000–1.03)
TSH: 6.78 microintl units/mL — ABNORMAL HIGH (ref 0.35–5.50)
Total Bilirubin: 1 mg/dL (ref 0.3–1.2)
Total CHOL/HDL Ratio: 6.4
Total Protein, Urine: NEGATIVE mg/dL
Total Protein: 7.6 g/dL (ref 6.0–8.3)
Triglycerides: 304 mg/dL (ref 0–149)
Urine Glucose: NEGATIVE mg/dL
Urobilinogen, UA: 0.2 (ref 0.0–1.0)
VLDL: 61 mg/dL — ABNORMAL HIGH (ref 0–40)
WBC, UA: NONE SEEN cells/hpf
WBC: 9.4 10*3/uL (ref 4.5–10.5)
pH: 6 (ref 5.0–8.0)

## 2007-10-16 ENCOUNTER — Telehealth (INDEPENDENT_AMBULATORY_CARE_PROVIDER_SITE_OTHER): Payer: Self-pay | Admitting: *Deleted

## 2007-10-16 ENCOUNTER — Ambulatory Visit: Payer: Self-pay

## 2007-10-16 ENCOUNTER — Ambulatory Visit: Payer: Self-pay | Admitting: Internal Medicine

## 2007-10-21 ENCOUNTER — Ambulatory Visit: Payer: Self-pay

## 2007-10-21 ENCOUNTER — Encounter: Payer: Self-pay | Admitting: Internal Medicine

## 2007-10-23 ENCOUNTER — Ambulatory Visit: Payer: Self-pay

## 2007-10-23 ENCOUNTER — Encounter: Payer: Self-pay | Admitting: Internal Medicine

## 2007-11-06 ENCOUNTER — Encounter: Payer: Self-pay | Admitting: Internal Medicine

## 2007-11-10 ENCOUNTER — Ambulatory Visit (HOSPITAL_COMMUNITY): Admission: RE | Admit: 2007-11-10 | Discharge: 2007-11-10 | Payer: Self-pay | Admitting: Neurology

## 2007-11-10 ENCOUNTER — Telehealth: Payer: Self-pay | Admitting: Internal Medicine

## 2007-11-13 ENCOUNTER — Emergency Department (HOSPITAL_COMMUNITY): Admission: EM | Admit: 2007-11-13 | Discharge: 2007-11-14 | Payer: Self-pay | Admitting: Emergency Medicine

## 2007-11-18 ENCOUNTER — Ambulatory Visit: Payer: Self-pay | Admitting: Internal Medicine

## 2007-11-18 DIAGNOSIS — R21 Rash and other nonspecific skin eruption: Secondary | ICD-10-CM | POA: Insufficient documentation

## 2007-11-19 LAB — CONVERTED CEMR LAB
ALT: 33 units/L (ref 0–35)
AST: 20 units/L (ref 0–37)
Albumin: 4.2 g/dL (ref 3.5–5.2)
Alkaline Phosphatase: 72 units/L (ref 39–117)
Bilirubin, Direct: 0.2 mg/dL (ref 0.0–0.3)
Cholesterol: 150 mg/dL (ref 0–200)
Direct LDL: 97 mg/dL
HDL: 35.7 mg/dL — ABNORMAL LOW (ref >39.0)
Total Bilirubin: 0.9 mg/dL (ref 0.3–1.2)
Total CHOL/HDL Ratio: 4.2
Total Protein: 6.8 g/dL (ref 6.0–8.3)
Triglycerides: 208 mg/dL (ref 0–149)
VLDL: 42 mg/dL — ABNORMAL HIGH (ref 0–40)

## 2007-12-26 ENCOUNTER — Ambulatory Visit: Payer: Self-pay | Admitting: Internal Medicine

## 2007-12-26 DIAGNOSIS — L03319 Cellulitis of trunk, unspecified: Secondary | ICD-10-CM

## 2007-12-26 DIAGNOSIS — L02219 Cutaneous abscess of trunk, unspecified: Secondary | ICD-10-CM | POA: Insufficient documentation

## 2008-01-28 ENCOUNTER — Encounter: Payer: Self-pay | Admitting: Internal Medicine

## 2008-02-09 ENCOUNTER — Telehealth: Payer: Self-pay | Admitting: Internal Medicine

## 2008-05-04 ENCOUNTER — Ambulatory Visit: Payer: Self-pay | Admitting: Internal Medicine

## 2008-05-04 DIAGNOSIS — I498 Other specified cardiac arrhythmias: Secondary | ICD-10-CM | POA: Insufficient documentation

## 2008-05-04 HISTORY — DX: Other specified cardiac arrhythmias: I49.8

## 2008-06-15 ENCOUNTER — Ambulatory Visit: Payer: Self-pay | Admitting: Internal Medicine

## 2008-06-15 DIAGNOSIS — L02818 Cutaneous abscess of other sites: Secondary | ICD-10-CM

## 2008-06-15 DIAGNOSIS — M545 Low back pain, unspecified: Secondary | ICD-10-CM | POA: Insufficient documentation

## 2008-06-15 DIAGNOSIS — M549 Dorsalgia, unspecified: Secondary | ICD-10-CM | POA: Insufficient documentation

## 2008-06-15 DIAGNOSIS — L03818 Cellulitis of other sites: Secondary | ICD-10-CM

## 2008-06-15 HISTORY — DX: Cutaneous abscess of other sites: L02.818

## 2008-06-15 HISTORY — DX: Dorsalgia, unspecified: M54.9

## 2008-06-15 HISTORY — DX: Cellulitis of other sites: L03.818

## 2008-06-15 LAB — CONVERTED CEMR LAB
Bilirubin Urine: NEGATIVE
Hemoglobin, Urine: NEGATIVE
Ketones, ur: NEGATIVE mg/dL
Leukocytes, UA: NEGATIVE
Nitrite: NEGATIVE
Specific Gravity, Urine: 1.015 (ref 1.000–1.030)
Total Protein, Urine: NEGATIVE mg/dL
Urine Glucose: NEGATIVE mg/dL
Urobilinogen, UA: 0.2 (ref 0.0–1.0)
pH: 6.5 (ref 5.0–8.0)

## 2008-07-07 ENCOUNTER — Ambulatory Visit: Payer: Self-pay | Admitting: Internal Medicine

## 2008-07-08 LAB — CONVERTED CEMR LAB
ALT: 32 units/L (ref 0–35)
AST: 26 units/L (ref 0–37)
Albumin: 4.2 g/dL (ref 3.5–5.2)
Alkaline Phosphatase: 103 units/L (ref 39–117)
BUN: 25 mg/dL — ABNORMAL HIGH (ref 6–23)
Basophils Absolute: 0.1 10*3/uL (ref 0.0–0.1)
Basophils Relative: 0.8 % (ref 0.0–3.0)
Bilirubin Urine: NEGATIVE
Bilirubin, Direct: 0.1 mg/dL (ref 0.0–0.3)
CO2: 31 meq/L (ref 19–32)
Calcium: 9.1 mg/dL (ref 8.4–10.5)
Chloride: 107 meq/L (ref 96–112)
Cholesterol: 191 mg/dL (ref 0–200)
Creatinine, Ser: 1.4 mg/dL — ABNORMAL HIGH (ref 0.4–1.2)
Eosinophils Absolute: 0.2 10*3/uL (ref 0.0–0.7)
Eosinophils Relative: 2.5 % (ref 0.0–5.0)
GFR calc non Af Amer: 42.03 mL/min (ref >60)
Glucose, Bld: 142 mg/dL — ABNORMAL HIGH (ref 70–99)
HCT: 40.9 % (ref 36.0–46.0)
HDL: 37.7 mg/dL — ABNORMAL LOW (ref >39.00)
Hemoglobin: 14.2 g/dL (ref 12.0–15.0)
Hgb A1c MFr Bld: 5.8 % (ref 4.6–6.5)
Ketones, ur: NEGATIVE mg/dL
LDL Cholesterol: 114 mg/dL — ABNORMAL HIGH (ref 0–99)
Leukocytes, UA: NEGATIVE
Lymphocytes Relative: 19 % (ref 12.0–46.0)
Lymphs Abs: 1.7 10*3/uL (ref 0.7–4.0)
MCHC: 34.8 g/dL (ref 30.0–36.0)
MCV: 92.7 fL (ref 78.0–100.0)
Monocytes Absolute: 0.4 10*3/uL (ref 0.1–1.0)
Monocytes Relative: 5 % (ref 3.0–12.0)
Neutro Abs: 6.3 10*3/uL (ref 1.4–7.7)
Neutrophils Relative %: 72.7 % (ref 43.0–77.0)
Nitrite: NEGATIVE
Platelets: 210 10*3/uL (ref 150.0–400.0)
Potassium: 3.3 meq/L — ABNORMAL LOW (ref 3.5–5.1)
RBC: 4.41 M/uL (ref 3.87–5.11)
RDW: 12.5 % (ref 11.5–14.6)
Sodium: 146 meq/L — ABNORMAL HIGH (ref 135–145)
Specific Gravity, Urine: 1.025 (ref 1.000–1.030)
TSH: 1.38 microintl units/mL (ref 0.35–5.50)
Total Bilirubin: 0.7 mg/dL (ref 0.3–1.2)
Total CHOL/HDL Ratio: 5
Total Protein, Urine: NEGATIVE mg/dL
Total Protein: 6.8 g/dL (ref 6.0–8.3)
Triglycerides: 198 mg/dL — ABNORMAL HIGH (ref 0.0–149.0)
Urine Glucose: NEGATIVE mg/dL
Urobilinogen, UA: 0.2 (ref 0.0–1.0)
VLDL: 39.6 mg/dL (ref 0.0–40.0)
Vit D, 25-Hydroxy: 77 ng/mL (ref 30–89)
WBC: 8.7 10*3/uL (ref 4.5–10.5)
pH: 6 (ref 5.0–8.0)

## 2008-08-31 ENCOUNTER — Ambulatory Visit: Payer: Self-pay | Admitting: Internal Medicine

## 2008-08-31 DIAGNOSIS — R1084 Generalized abdominal pain: Secondary | ICD-10-CM

## 2008-08-31 DIAGNOSIS — R109 Unspecified abdominal pain: Secondary | ICD-10-CM | POA: Insufficient documentation

## 2008-08-31 LAB — CONVERTED CEMR LAB
ALT: 26 units/L (ref 0–35)
AST: 22 units/L (ref 0–37)
Albumin: 4.2 g/dL (ref 3.5–5.2)
Alkaline Phosphatase: 81 units/L (ref 39–117)
Basophils Absolute: 0 10*3/uL (ref 0.0–0.1)
Basophils Relative: 0.4 % (ref 0.0–3.0)
Bilirubin Urine: NEGATIVE
Bilirubin, Direct: 0.1 mg/dL (ref 0.0–0.3)
Eosinophils Absolute: 0.3 10*3/uL (ref 0.0–0.7)
Eosinophils Relative: 4.4 % (ref 0.0–5.0)
HCT: 44.3 % (ref 36.0–46.0)
Hemoglobin: 15.8 g/dL — ABNORMAL HIGH (ref 12.0–15.0)
Ketones, ur: NEGATIVE mg/dL
Leukocytes, UA: NEGATIVE
Lipase: 14 units/L (ref 11.0–59.0)
Lymphocytes Relative: 23.7 % (ref 12.0–46.0)
Lymphs Abs: 1.8 10*3/uL (ref 0.7–4.0)
MCHC: 35.6 g/dL (ref 30.0–36.0)
MCV: 92.1 fL (ref 78.0–100.0)
Monocytes Absolute: 0.4 10*3/uL (ref 0.1–1.0)
Monocytes Relative: 5.4 % (ref 3.0–12.0)
Neutro Abs: 5 10*3/uL (ref 1.4–7.7)
Neutrophils Relative %: 66.1 % (ref 43.0–77.0)
Nitrite: NEGATIVE
Platelets: 194 10*3/uL (ref 150.0–400.0)
RBC: 4.82 M/uL (ref 3.87–5.11)
RDW: 12.1 % (ref 11.5–14.6)
Specific Gravity, Urine: 1.02 (ref 1.000–1.030)
Total Bilirubin: 0.8 mg/dL (ref 0.3–1.2)
Total Protein, Urine: NEGATIVE mg/dL
Total Protein: 7 g/dL (ref 6.0–8.3)
Urine Glucose: NEGATIVE mg/dL
Urobilinogen, UA: 0.2 (ref 0.0–1.0)
WBC: 7.5 10*3/uL (ref 4.5–10.5)
pH: 5 (ref 5.0–8.0)

## 2008-09-15 ENCOUNTER — Ambulatory Visit: Payer: Self-pay | Admitting: Internal Medicine

## 2008-09-15 DIAGNOSIS — S8010XA Contusion of unspecified lower leg, initial encounter: Secondary | ICD-10-CM

## 2008-09-15 DIAGNOSIS — L259 Unspecified contact dermatitis, unspecified cause: Secondary | ICD-10-CM | POA: Insufficient documentation

## 2008-09-15 HISTORY — DX: Contusion of unspecified lower leg, initial encounter: S80.10XA

## 2008-09-28 ENCOUNTER — Telehealth: Payer: Self-pay | Admitting: Internal Medicine

## 2008-10-18 ENCOUNTER — Telehealth: Payer: Self-pay | Admitting: Internal Medicine

## 2008-10-27 ENCOUNTER — Telehealth: Payer: Self-pay | Admitting: Internal Medicine

## 2008-11-01 ENCOUNTER — Encounter: Payer: Self-pay | Admitting: Internal Medicine

## 2008-11-15 ENCOUNTER — Ambulatory Visit: Payer: Self-pay | Admitting: Internal Medicine

## 2008-11-15 DIAGNOSIS — R519 Headache, unspecified: Secondary | ICD-10-CM | POA: Insufficient documentation

## 2008-11-15 DIAGNOSIS — R51 Headache: Secondary | ICD-10-CM | POA: Insufficient documentation

## 2008-11-18 ENCOUNTER — Ambulatory Visit: Payer: Self-pay | Admitting: Internal Medicine

## 2008-11-18 DIAGNOSIS — M6740 Ganglion, unspecified site: Secondary | ICD-10-CM | POA: Insufficient documentation

## 2009-01-10 ENCOUNTER — Ambulatory Visit: Payer: Self-pay | Admitting: Internal Medicine

## 2009-01-10 DIAGNOSIS — R358 Other polyuria: Secondary | ICD-10-CM | POA: Insufficient documentation

## 2009-01-10 DIAGNOSIS — J019 Acute sinusitis, unspecified: Secondary | ICD-10-CM | POA: Insufficient documentation

## 2009-01-10 DIAGNOSIS — R3589 Other polyuria: Secondary | ICD-10-CM | POA: Insufficient documentation

## 2009-01-10 LAB — CONVERTED CEMR LAB
Bilirubin Urine: NEGATIVE
Blood in Urine, dipstick: NEGATIVE
Glucose, Urine, Semiquant: NEGATIVE
Ketones, urine, test strip: NEGATIVE
Nitrite: NEGATIVE
Protein, U semiquant: NEGATIVE
Specific Gravity, Urine: 1.015
Urobilinogen, UA: 0.2
WBC Urine, dipstick: NEGATIVE
pH: 5

## 2009-01-11 LAB — CONVERTED CEMR LAB
ALT: 43 units/L — ABNORMAL HIGH (ref 0–35)
AST: 24 units/L (ref 0–37)
Albumin: 4.3 g/dL (ref 3.5–5.2)
Alkaline Phosphatase: 75 units/L (ref 39–117)
BUN: 19 mg/dL (ref 6–23)
Basophils Absolute: 0 10*3/uL (ref 0.0–0.1)
Basophils Relative: 0.2 % (ref 0.0–3.0)
Bilirubin Urine: NEGATIVE
Bilirubin, Direct: 0.1 mg/dL (ref 0.0–0.3)
CO2: 28 meq/L (ref 19–32)
Calcium: 9.4 mg/dL (ref 8.4–10.5)
Chloride: 106 meq/L (ref 96–112)
Cholesterol: 229 mg/dL — ABNORMAL HIGH (ref 0–200)
Creatinine, Ser: 1.3 mg/dL — ABNORMAL HIGH (ref 0.4–1.2)
Direct LDL: 173.2 mg/dL
Eosinophils Absolute: 0.4 10*3/uL (ref 0.0–0.7)
Eosinophils Relative: 4.5 % (ref 0.0–5.0)
Folate: >20 ng/mL
GFR calc non Af Amer: 45.69 mL/min (ref >60)
Glucose, Bld: 94 mg/dL (ref 70–99)
HCT: 41.9 % (ref 36.0–46.0)
HDL: 46.5 mg/dL (ref >39.00)
Hemoglobin: 14.5 g/dL (ref 12.0–15.0)
Hgb A1c MFr Bld: 5.9 % (ref 4.6–6.5)
Ketones, ur: NEGATIVE mg/dL
Leukocytes, UA: NEGATIVE
Lymphocytes Relative: 22.5 % (ref 12.0–46.0)
Lymphs Abs: 2.1 10*3/uL (ref 0.7–4.0)
MCHC: 34.6 g/dL (ref 30.0–36.0)
MCV: 94.7 fL (ref 78.0–100.0)
Monocytes Absolute: 0.5 10*3/uL (ref 0.1–1.0)
Monocytes Relative: 5.2 % (ref 3.0–12.0)
Neutro Abs: 6.5 10*3/uL (ref 1.4–7.7)
Neutrophils Relative %: 67.6 % (ref 43.0–77.0)
Nitrite: NEGATIVE
Platelets: 211 10*3/uL (ref 150.0–400.0)
Potassium: 3.7 meq/L (ref 3.5–5.1)
RBC: 4.42 M/uL (ref 3.87–5.11)
RDW: 12.2 % (ref 11.5–14.6)
Sed Rate: 14 mm/hr (ref 0–22)
Sodium: 143 meq/L (ref 135–145)
Specific Gravity, Urine: 1.025 (ref 1.000–1.030)
TSH: 3.51 microintl units/mL (ref 0.35–5.50)
Total Bilirubin: 0.7 mg/dL (ref 0.3–1.2)
Total CHOL/HDL Ratio: 5
Total Protein, Urine: NEGATIVE mg/dL
Total Protein: 6.7 g/dL (ref 6.0–8.3)
Triglycerides: 181 mg/dL — ABNORMAL HIGH (ref 0.0–149.0)
Urine Glucose: NEGATIVE mg/dL
Urobilinogen, UA: 0.2 (ref 0.0–1.0)
VLDL: 36.2 mg/dL (ref 0.0–40.0)
Vitamin B-12: 359 pg/mL (ref 211–911)
WBC: 9.5 10*3/uL (ref 4.5–10.5)
pH: 5.5 (ref 5.0–8.0)

## 2009-02-17 ENCOUNTER — Ambulatory Visit: Payer: Self-pay | Admitting: Internal Medicine

## 2009-02-21 ENCOUNTER — Telehealth: Payer: Self-pay | Admitting: Internal Medicine

## 2009-03-15 ENCOUNTER — Ambulatory Visit: Payer: Self-pay | Admitting: Internal Medicine

## 2009-03-21 ENCOUNTER — Ambulatory Visit: Payer: Self-pay | Admitting: Internal Medicine

## 2009-03-21 DIAGNOSIS — K921 Melena: Secondary | ICD-10-CM | POA: Insufficient documentation

## 2009-03-21 LAB — CONVERTED CEMR LAB
ALT: 42 units/L — ABNORMAL HIGH (ref 0–35)
AST: 27 units/L (ref 0–37)
Albumin: 4 g/dL (ref 3.5–5.2)
Alkaline Phosphatase: 66 units/L (ref 39–117)
Amylase: 50 units/L (ref 27–131)
BUN: 15 mg/dL (ref 6–23)
Basophils Absolute: 0.1 10*3/uL (ref 0.0–0.1)
Basophils Relative: 0.8 % (ref 0.0–3.0)
Bilirubin Urine: NEGATIVE
Bilirubin, Direct: 0.1 mg/dL (ref 0.0–0.3)
CO2: 27 meq/L (ref 19–32)
Calcium: 9.1 mg/dL (ref 8.4–10.5)
Chloride: 106 meq/L (ref 96–112)
Creatinine, Ser: 1.2 mg/dL (ref 0.4–1.2)
Eosinophils Absolute: 0.3 10*3/uL (ref 0.0–0.7)
Eosinophils Relative: 4 % (ref 0.0–5.0)
GFR calc non Af Amer: 50.08 mL/min (ref >60)
Glucose, Bld: 121 mg/dL — ABNORMAL HIGH (ref 70–99)
H Pylori IgG: NEGATIVE
HCT: 41.2 % (ref 36.0–46.0)
Hemoglobin, Urine: NEGATIVE
Hemoglobin: 13.8 g/dL (ref 12.0–15.0)
Hgb A1c MFr Bld: 6.1 % (ref 4.6–6.5)
Ketones, ur: NEGATIVE mg/dL
Leukocytes, UA: NEGATIVE
Lipase: 19 units/L (ref 11.0–59.0)
Lymphocytes Relative: 24.7 % (ref 12.0–46.0)
Lymphs Abs: 1.6 10*3/uL (ref 0.7–4.0)
MCHC: 33.5 g/dL (ref 30.0–36.0)
MCV: 94.7 fL (ref 78.0–100.0)
Monocytes Absolute: 0.4 10*3/uL (ref 0.1–1.0)
Monocytes Relative: 5.5 % (ref 3.0–12.0)
Neutro Abs: 4.1 10*3/uL (ref 1.4–7.7)
Neutrophils Relative %: 65 % (ref 43.0–77.0)
Nitrite: NEGATIVE
Platelets: 163 10*3/uL (ref 150.0–400.0)
Potassium: 4.2 meq/L (ref 3.5–5.1)
RBC: 4.35 M/uL (ref 3.87–5.11)
RDW: 12.7 % (ref 11.5–14.6)
Sodium: 142 meq/L (ref 135–145)
Specific Gravity, Urine: 1.01 (ref 1.000–1.030)
Total Bilirubin: 0.6 mg/dL (ref 0.3–1.2)
Total Protein, Urine: NEGATIVE mg/dL
Total Protein: 6.4 g/dL (ref 6.0–8.3)
Urine Glucose: NEGATIVE mg/dL
Urobilinogen, UA: 0.2 (ref 0.0–1.0)
WBC: 6.5 10*3/uL (ref 4.5–10.5)
pH: 7 (ref 5.0–8.0)

## 2009-04-12 ENCOUNTER — Ambulatory Visit: Payer: Self-pay | Admitting: Gastroenterology

## 2009-04-13 ENCOUNTER — Ambulatory Visit: Payer: Self-pay | Admitting: Gastroenterology

## 2009-04-15 ENCOUNTER — Ambulatory Visit: Payer: Self-pay | Admitting: Gastroenterology

## 2009-04-15 LAB — CONVERTED CEMR LAB: Fecal Occult Bld: NEGATIVE

## 2009-04-22 ENCOUNTER — Encounter: Payer: Self-pay | Admitting: Internal Medicine

## 2009-04-25 ENCOUNTER — Telehealth: Payer: Self-pay | Admitting: Internal Medicine

## 2009-04-25 ENCOUNTER — Encounter: Payer: Self-pay | Admitting: Internal Medicine

## 2009-05-19 ENCOUNTER — Encounter: Payer: Self-pay | Admitting: Internal Medicine

## 2009-06-20 ENCOUNTER — Ambulatory Visit: Payer: Self-pay | Admitting: Internal Medicine

## 2009-06-20 DIAGNOSIS — R252 Cramp and spasm: Secondary | ICD-10-CM | POA: Insufficient documentation

## 2009-06-20 DIAGNOSIS — M79609 Pain in unspecified limb: Secondary | ICD-10-CM | POA: Insufficient documentation

## 2009-06-20 DIAGNOSIS — R197 Diarrhea, unspecified: Secondary | ICD-10-CM | POA: Insufficient documentation

## 2009-06-21 LAB — CONVERTED CEMR LAB
ALT: 96 units/L — ABNORMAL HIGH (ref 0–35)
AST: 45 units/L — ABNORMAL HIGH (ref 0–37)
Albumin: 4.2 g/dL (ref 3.5–5.2)
Alkaline Phosphatase: 75 units/L (ref 39–117)
BUN: 15 mg/dL (ref 6–23)
Basophils Absolute: 0 10*3/uL (ref 0.0–0.1)
Basophils Relative: 0.4 % (ref 0.0–3.0)
Bilirubin Urine: NEGATIVE
Bilirubin, Direct: 0.1 mg/dL (ref 0.0–0.3)
CO2: 29 meq/L (ref 19–32)
Calcium: 9.2 mg/dL (ref 8.4–10.5)
Chloride: 106 meq/L (ref 96–112)
Creatinine, Ser: 1.2 mg/dL (ref 0.4–1.2)
Eosinophils Absolute: 0.3 10*3/uL (ref 0.0–0.7)
Eosinophils Relative: 5.5 % — ABNORMAL HIGH (ref 0.0–5.0)
Folate: >20 ng/mL
GFR calc non Af Amer: 50.03 mL/min (ref >60)
Glucose, Bld: 98 mg/dL (ref 70–99)
HCT: 39.7 % (ref 36.0–46.0)
Hemoglobin, Urine: NEGATIVE
Hemoglobin: 13.6 g/dL (ref 12.0–15.0)
Ketones, ur: NEGATIVE mg/dL
Lipase: 24 units/L (ref 11.0–59.0)
Lymphocytes Relative: 25.4 % (ref 12.0–46.0)
Lymphs Abs: 1.6 10*3/uL (ref 0.7–4.0)
MCHC: 34.2 g/dL (ref 30.0–36.0)
MCV: 93.9 fL (ref 78.0–100.0)
Magnesium: 1.7 mg/dL (ref 1.5–2.5)
Monocytes Absolute: 0.5 10*3/uL (ref 0.1–1.0)
Monocytes Relative: 7.3 % (ref 3.0–12.0)
Neutro Abs: 3.9 10*3/uL (ref 1.4–7.7)
Neutrophils Relative %: 61.4 % (ref 43.0–77.0)
Nitrite: NEGATIVE
Platelets: 186 10*3/uL (ref 150.0–400.0)
Potassium: 3.8 meq/L (ref 3.5–5.1)
RBC: 4.23 M/uL (ref 3.87–5.11)
RDW: 13.4 % (ref 11.5–14.6)
Sed Rate: 9 mm/hr (ref 0–22)
Sodium: 142 meq/L (ref 135–145)
Specific Gravity, Urine: 1.025 (ref 1.000–1.030)
TSH: 4.89 microintl units/mL (ref 0.35–5.50)
Total Bilirubin: 0.7 mg/dL (ref 0.3–1.2)
Total CK: 60 units/L (ref 7–177)
Total Protein, Urine: NEGATIVE mg/dL
Total Protein: 6.2 g/dL (ref 6.0–8.3)
Urine Glucose: NEGATIVE mg/dL
Urobilinogen, UA: 0.2 (ref 0.0–1.0)
Vitamin B-12: 364 pg/mL (ref 211–911)
WBC: 6.3 10*3/uL (ref 4.5–10.5)
pH: 5.5 (ref 5.0–8.0)

## 2009-07-04 ENCOUNTER — Telehealth: Payer: Self-pay | Admitting: Internal Medicine

## 2009-07-06 ENCOUNTER — Ambulatory Visit: Payer: Self-pay | Admitting: Internal Medicine

## 2009-07-06 DIAGNOSIS — N390 Urinary tract infection, site not specified: Secondary | ICD-10-CM | POA: Insufficient documentation

## 2009-07-06 LAB — CONVERTED CEMR LAB
ALT: 43 units/L — ABNORMAL HIGH (ref 0–35)
AST: 22 units/L (ref 0–37)
Albumin: 3.8 g/dL (ref 3.5–5.2)
Alkaline Phosphatase: 82 units/L (ref 39–117)
BUN: 24 mg/dL — ABNORMAL HIGH (ref 6–23)
Basophils Absolute: 0 10*3/uL (ref 0.0–0.1)
Basophils Relative: 0.2 % (ref 0.0–3.0)
Bilirubin Urine: NEGATIVE
Bilirubin, Direct: 0 mg/dL (ref 0.0–0.3)
CO2: 28 meq/L (ref 19–32)
Calcium: 9.4 mg/dL (ref 8.4–10.5)
Chloride: 106 meq/L (ref 96–112)
Creatinine, Ser: 1.2 mg/dL (ref 0.4–1.2)
Eosinophils Absolute: 0.3 10*3/uL (ref 0.0–0.7)
Eosinophils Relative: 3.5 % (ref 0.0–5.0)
Folate: >20 ng/mL
GFR calc non Af Amer: 48.62 mL/min (ref >60)
Glucose, Bld: 111 mg/dL — ABNORMAL HIGH (ref 70–99)
HCT: 40 % (ref 36.0–46.0)
Hemoglobin, Urine: NEGATIVE
Hemoglobin: 13.6 g/dL (ref 12.0–15.0)
Ketones, ur: NEGATIVE mg/dL
Lymphocytes Relative: 19 % (ref 12.0–46.0)
Lymphs Abs: 1.9 10*3/uL (ref 0.7–4.0)
MCHC: 34.1 g/dL (ref 30.0–36.0)
MCV: 94.9 fL (ref 78.0–100.0)
Monocytes Absolute: 0.6 10*3/uL (ref 0.1–1.0)
Monocytes Relative: 6 % (ref 3.0–12.0)
Neutro Abs: 7.1 10*3/uL (ref 1.4–7.7)
Neutrophils Relative %: 71.3 % (ref 43.0–77.0)
Nitrite: NEGATIVE
Platelets: 215 10*3/uL (ref 150.0–400.0)
Potassium: 3.9 meq/L (ref 3.5–5.1)
RBC: 4.22 M/uL (ref 3.87–5.11)
RDW: 13.1 % (ref 11.5–14.6)
Sed Rate: 7 mm/hr (ref 0–22)
Sodium: 144 meq/L (ref 135–145)
Specific Gravity, Urine: >=1.03 (ref 1.000–1.030)
Total Bilirubin: 0.2 mg/dL — ABNORMAL LOW (ref 0.3–1.2)
Total CK: 51 units/L (ref 7–177)
Total Protein, Urine: NEGATIVE mg/dL
Total Protein: 6.3 g/dL (ref 6.0–8.3)
Urine Glucose: NEGATIVE mg/dL
Urobilinogen, UA: 0.2 (ref 0.0–1.0)
Vitamin B-12: 382 pg/mL (ref 211–911)
WBC: 9.9 10*3/uL (ref 4.5–10.5)
pH: 6 (ref 5.0–8.0)

## 2009-08-09 ENCOUNTER — Telehealth: Payer: Self-pay | Admitting: Internal Medicine

## 2009-08-15 ENCOUNTER — Ambulatory Visit: Payer: Self-pay | Admitting: Internal Medicine

## 2009-08-15 LAB — CONVERTED CEMR LAB
Bilirubin Urine: NEGATIVE
Hemoglobin, Urine: NEGATIVE
Ketones, ur: NEGATIVE mg/dL
Nitrite: NEGATIVE
Specific Gravity, Urine: 1.025 (ref 1.000–1.030)
Total Protein, Urine: NEGATIVE mg/dL
Urine Glucose: NEGATIVE mg/dL
Urobilinogen, UA: 0.2 (ref 0.0–1.0)
pH: 5.5 (ref 5.0–8.0)

## 2009-08-18 ENCOUNTER — Ambulatory Visit: Payer: Self-pay | Admitting: Internal Medicine

## 2009-08-26 ENCOUNTER — Ambulatory Visit: Payer: Self-pay | Admitting: Internal Medicine

## 2009-08-26 LAB — CONVERTED CEMR LAB
ALT: 45 units/L — ABNORMAL HIGH (ref 0–35)
AST: 23 units/L (ref 0–37)
Albumin: 4 g/dL (ref 3.5–5.2)
Alkaline Phosphatase: 80 units/L (ref 39–117)
BUN: 18 mg/dL (ref 6–23)
Basophils Absolute: 0 10*3/uL (ref 0.0–0.1)
Basophils Relative: 0.2 % (ref 0.0–3.0)
Bilirubin Urine: NEGATIVE
Bilirubin, Direct: 0.1 mg/dL (ref 0.0–0.3)
CO2: 27 meq/L (ref 19–32)
Calcium: 8.9 mg/dL (ref 8.4–10.5)
Chloride: 108 meq/L (ref 96–112)
Cholesterol: 203 mg/dL — ABNORMAL HIGH (ref 0–200)
Creatinine, Ser: 1.1 mg/dL (ref 0.4–1.2)
Creatinine,U: 103 mg/dL
Direct LDL: 139.6 mg/dL
Eosinophils Absolute: 0.2 10*3/uL (ref 0.0–0.7)
Eosinophils Relative: 3.5 % (ref 0.0–5.0)
GFR calc non Af Amer: 58.32 mL/min (ref >60)
Glucose, Bld: 104 mg/dL — ABNORMAL HIGH (ref 70–99)
HCT: 39.5 % (ref 36.0–46.0)
HDL: 35.9 mg/dL — ABNORMAL LOW (ref >39.00)
Hemoglobin: 13.6 g/dL (ref 12.0–15.0)
Hgb A1c MFr Bld: 6.4 % (ref 4.6–6.5)
Ketones, ur: NEGATIVE mg/dL
Lymphocytes Relative: 19.2 % (ref 12.0–46.0)
Lymphs Abs: 1.3 10*3/uL (ref 0.7–4.0)
MCHC: 34.4 g/dL (ref 30.0–36.0)
MCV: 94.7 fL (ref 78.0–100.0)
Microalb Creat Ratio: 1.3 mg/g (ref 0.0–30.0)
Microalb, Ur: 1.3 mg/dL (ref 0.0–1.9)
Monocytes Absolute: 0.4 10*3/uL (ref 0.1–1.0)
Monocytes Relative: 5.7 % (ref 3.0–12.0)
Neutro Abs: 5 10*3/uL (ref 1.4–7.7)
Neutrophils Relative %: 71.4 % (ref 43.0–77.0)
Nitrite: NEGATIVE
Platelets: 188 10*3/uL (ref 150.0–400.0)
Potassium: 4.5 meq/L (ref 3.5–5.1)
RBC: 4.17 M/uL (ref 3.87–5.11)
RDW: 13.5 % (ref 11.5–14.6)
Sodium: 143 meq/L (ref 135–145)
Specific Gravity, Urine: 1.02 (ref 1.000–1.030)
TSH: 10.77 microintl units/mL — ABNORMAL HIGH (ref 0.35–5.50)
Total Bilirubin: 0.4 mg/dL (ref 0.3–1.2)
Total CHOL/HDL Ratio: 6
Total Protein, Urine: NEGATIVE mg/dL
Total Protein: 6.4 g/dL (ref 6.0–8.3)
Triglycerides: 243 mg/dL — ABNORMAL HIGH (ref 0.0–149.0)
Urine Glucose: NEGATIVE mg/dL
Urobilinogen, UA: 0.2 (ref 0.0–1.0)
VLDL: 48.6 mg/dL — ABNORMAL HIGH (ref 0.0–40.0)
WBC: 6.9 10*3/uL (ref 4.5–10.5)
pH: 6 (ref 5.0–8.0)

## 2009-08-30 ENCOUNTER — Ambulatory Visit: Payer: Self-pay | Admitting: Internal Medicine

## 2009-09-08 ENCOUNTER — Encounter (INDEPENDENT_AMBULATORY_CARE_PROVIDER_SITE_OTHER): Payer: Self-pay | Admitting: *Deleted

## 2009-09-08 ENCOUNTER — Ambulatory Visit: Payer: Self-pay | Admitting: Internal Medicine

## 2009-09-08 DIAGNOSIS — R079 Chest pain, unspecified: Secondary | ICD-10-CM | POA: Insufficient documentation

## 2009-09-08 DIAGNOSIS — R0789 Other chest pain: Secondary | ICD-10-CM | POA: Insufficient documentation

## 2009-09-08 HISTORY — DX: Chest pain, unspecified: R07.9

## 2009-10-25 ENCOUNTER — Encounter: Payer: Self-pay | Admitting: Internal Medicine

## 2009-10-27 ENCOUNTER — Encounter: Payer: Self-pay | Admitting: Internal Medicine

## 2009-11-18 ENCOUNTER — Telehealth: Payer: Self-pay | Admitting: Internal Medicine

## 2009-12-29 ENCOUNTER — Ambulatory Visit: Payer: Self-pay | Admitting: Internal Medicine

## 2010-02-20 ENCOUNTER — Ambulatory Visit: Payer: Self-pay | Admitting: Internal Medicine

## 2010-02-28 ENCOUNTER — Telehealth: Payer: Self-pay | Admitting: Internal Medicine

## 2010-03-11 ENCOUNTER — Encounter: Payer: Self-pay | Admitting: Internal Medicine

## 2010-03-19 LAB — CONVERTED CEMR LAB
ALT: 23 units/L (ref 0–35)
AST: 18 units/L (ref 0–37)
Albumin: 4.1 g/dL (ref 3.5–5.2)
Alkaline Phosphatase: 96 units/L (ref 39–117)
BUN: 24 mg/dL — ABNORMAL HIGH (ref 6–23)
Basophils Absolute: 0 10*3/uL (ref 0.0–0.1)
Basophils Relative: 0.3 % (ref 0.0–3.0)
Bilirubin, Direct: 0.1 mg/dL (ref 0.0–0.3)
CO2: 27 meq/L (ref 19–32)
Calcium: 9.2 mg/dL (ref 8.4–10.5)
Chloride: 105 meq/L (ref 96–112)
Creatinine, Ser: 1.4 mg/dL — ABNORMAL HIGH (ref 0.4–1.2)
Eosinophils Absolute: 0.2 10*3/uL (ref 0.0–0.7)
Eosinophils Relative: 3.1 % (ref 0.0–5.0)
GFR calc non Af Amer: 42.06 mL/min (ref >60)
Glucose, Bld: 95 mg/dL (ref 70–99)
HCT: 40.9 % (ref 36.0–46.0)
Hemoglobin: 13.9 g/dL (ref 12.0–15.0)
Hgb A1c MFr Bld: 5.7 % (ref 4.6–6.5)
Lymphocytes Relative: 22.9 % (ref 12.0–46.0)
Lymphs Abs: 1.7 10*3/uL (ref 0.7–4.0)
MCHC: 33.9 g/dL (ref 30.0–36.0)
MCV: 91.9 fL (ref 78.0–100.0)
Monocytes Absolute: 0.4 10*3/uL (ref 0.1–1.0)
Monocytes Relative: 4.9 % (ref 3.0–12.0)
Neutro Abs: 5.3 10*3/uL (ref 1.4–7.7)
Neutrophils Relative %: 68.8 % (ref 43.0–77.0)
Platelets: 208 10*3/uL (ref 150.0–400.0)
Potassium: 3.9 meq/L (ref 3.5–5.1)
RBC: 4.44 M/uL (ref 3.87–5.11)
RDW: 11.9 % (ref 11.5–14.6)
Sodium: 147 meq/L — ABNORMAL HIGH (ref 135–145)
TSH: 12.65 microintl units/mL — ABNORMAL HIGH (ref 0.35–5.50)
TSH: 2.33 microintl units/mL (ref 0.35–5.50)
Total Bilirubin: 1 mg/dL (ref 0.3–1.2)
Total Protein: 6.9 g/dL (ref 6.0–8.3)
WBC: 7.6 10*3/uL (ref 4.5–10.5)

## 2010-03-23 NOTE — Letter (Signed)
Summary: [facility]   Imported By: Phillis Knack 11/07/2009 10:49:14  _____________________________________________________________________  External Attachment:    Type:   Image     Comment:   External Document

## 2010-03-23 NOTE — Assessment & Plan Note (Signed)
Summary: NAUSEA/ HEADACHE/ NWS   Vital Signs:  Patient profile:   55 year old female Height:      62 inches Weight:      219 pounds BMI:     40.20 O2 Sat:      97 % on Room air Temp:     96.9 degrees F oral Pulse rate:   76 / minute BP sitting:   144 / 100  (left arm) Cuff size:   large  Vitals Entered ByShirlean Mylar Ewing (March 21, 2009 9:04 AM)  O2 Flow:  Room air  CC: nausea,headache/RE   CC:  nausea and headache/RE.  History of Present Illness: here after good compliance with meds since last visit;  the next day howevere developed upper pain and nausea , culminating in  vomit x 2 this AM;  also diarrhea this am  - soft stool only , non watery;  also with headache worsening gradually iwht the abd pain;  no fever, chills but does feel hot and cold;  no rash  or joint pain; no blood in vomit ; did notice small volume BRB 3 to 4 days ago only , one time (the day after last seen) iwthout pain to the rectum or anal;  no radiaion of pain of upper abd although "my throat is just burning after throwing up this am".  BP elevated today but usually < 140/90 at home, not checked recently. Has had some dizziness and vertigo, wondering if she might pass out, but no lightheded, and vomitied jsut after that today.  No GU symtpoms such as dysuria or freq.  Pt denies CP, sob, doe, wheezing, orthopnea, pnd, worsening LE edema, palps, dizziness or syncope  Pt denies new neuro symptoms such as headache, facial or extremity weakness . Not taking the furosemide lately as she has not had sweling recently.    Problems Prior to Update: 1)  Hematochezia  (ICD-578.1) 2)  Abdominal Pain, Epigastric  (ICD-789.06) 3)  Polyuria  (ICD-788.42) 4)  Sinusitis- Acute-nos  (ICD-461.9) 5)  Ganglion Cyst, Tendon Sheath  (ICD-727.42) 6)  Headache  (ICD-784.0) 7)  Contusion, Lower Leg  (ICD-924.10) 8)  Contact Dermatitis  (ICD-692.9) 9)  Abdominal Pain, Generalized  (ICD-789.07) 10)  Preventive Health Care   (ICD-V70.0) 11)  Cellulitis and Abscess of Other Specified Site  (ICD-682.8) 12)  Back Pain  (ICD-724.5) 13)  Rash-nonvesicular  (ICD-782.1) 14)  Bradycardia  (ICD-427.89) 15)  Weakness  (ICD-780.79) 16)  Cellulitis, Trunk  (ICD-682.2) 17)  Encounter For Long-term Use of Other Medications  (ICD-V58.69) 18)  Skin Rash  (ICD-782.1) 19)  Transient Ischemic Attack  (ICD-435.9) 20)  Carpal Tunnel Syndrome  (ICD-354.0) 21)  Vertigo  (ICD-780.4) 22)  Otitis Media, Serous, Right  (ICD-381.4) 23)  Esophagitis  (ICD-530.10) 24)  Dysuria  (ICD-788.1) 25)  Vitamin B12 Deficiency  (ICD-266.2) 26)  Fatigue  (ICD-780.79) 27)  Abdominal Pain, Epigastric  (ICD-789.06) 28)  Shingles, Hx of  (ICD-V13.8) 29)  Common Migraine  (ICD-346.10) 30)  Gerd  (ICD-530.81) 31)  Transient Ischemic Attacks, Hx of  (ICD-V12.50) 32)  Diabetes Mellitus, Type II  (ICD-250.00) 33)  Ovarian Cyst  (ICD-620.2) 34)  Allergic Rhinitis  (ICD-477.9) 35)  Hyperlipidemia  (ICD-272.4) 36)  Family History Diabetes 1st Degree Relative  (ICD-V18.0) 37)  Renal Insufficiency  (ICD-588.9) 38)  Cirrhosis  (ICD-571.5) 39)  Cholera, 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 Take 1  Tablet By Mouth Once Daily As Needed Swelling 2)  Norvasc 10 Mg  Tabs (Amlodipine Besylate) .Marland Kitchen.. 1 By Mouth Once Daily 3)  Levothroid 112 Mcg Tabs (Levothyroxine Sodium) .Marland Kitchen.. 1 By Mouth Once Daily 4)  Ecotrin 325 Mg Tbec (Aspirin) .Marland Kitchen.. 1 By Mouth Once Daily 5)  Simvastatin 80 Mg Tabs (Simvastatin) .Marland Kitchen.. 1 By Mouth 3 X Per Wk 6)  Nystatin 100000 Unit/gm Powd (Nystatin) .... Use Asd To Affected Area Two Times A Day As Needed 7)  Flexeril 5 Mg  Tabs (Cyclobenzaprine Hcl) .Marland Kitchen.. 1 By Mouth Three Times A Day As Needed 8)  Azithromycin 250 Mg Tabs (Azithromycin) .... 2po Qd For 1 Day, Then 1po Qd For 4days, Then Stop 9)  Daily Multi  Tabs (Multiple Vitamins-Minerals) .Marland Kitchen.. 1 By Mouth Once Daily 10)  Fish Oil 1000 Mg Caps (Omega-3 Fatty Acids) .Marland Kitchen.. 1 By Mouth Once Daily 11)  Clonidine Hcl 0.1 Mg Tabs (Clonidine Hcl) .Marland Kitchen.. 1 By Mouth Two Times A Day 12)  Sertraline Hcl 100 Mg Tabs (Sertraline Hcl) .... 2 By Mouth Once Daily 13)  Hydrocodone-Homatropine 5-1.5 Mg/73ml Syrp (Hydrocodone-Homatropine) .Marland Kitchen.. 1 Tsp By Mouth Q 6 Hrs As Needed Cough  Current Medications (verified): 1)  Furosemide 40 Mg Tabs (Furosemide) .... Take 1  Tablet By Mouth Once Daily As Needed Swelling 2)  Norvasc 10 Mg  Tabs (Amlodipine Besylate) .Marland Kitchen.. 1 By Mouth Once Daily 3)  Levothroid 112 Mcg Tabs (Levothyroxine Sodium) .Marland Kitchen.. 1 By Mouth Once Daily 4)  Ecotrin 325 Mg Tbec (Aspirin) .Marland Kitchen.. 1 By Mouth Once Daily 5)  Simvastatin 80 Mg Tabs (Simvastatin) .Marland Kitchen.. 1 By Mouth 3 X Per Wk 6)  Nystatin 100000 Unit/gm Powd (Nystatin) .... Use Asd To Affected Area Two Times A Day As Needed 7)  Flexeril 5 Mg Tabs (Cyclobenzaprine Hcl) .Marland Kitchen.. 1 By Mouth Three Times A Day As Needed 8)  Azithromycin 250 Mg Tabs (Azithromycin) .... 2po Qd For 1 Day, Then 1po Qd For 4days, Then Stop 9)  Daily Multi  Tabs (Multiple Vitamins-Minerals) .Marland Kitchen.. 1 By Mouth Once Daily 10)  Fish Oil 1000 Mg Caps (Omega-3 Fatty Acids) .Marland Kitchen.. 1 By Mouth Once Daily 11)  Clonidine Hcl 0.1 Mg Tabs (Clonidine Hcl) .Marland Kitchen.. 1 By Mouth Two Times A Day 12)  Sertraline Hcl 100 Mg Tabs (Sertraline Hcl) .... 2 By Mouth Once Daily 13)  Hydrocodone-Homatropine 5-1.5 Mg/65ml Syrp (Hydrocodone-Homatropine) .Marland Kitchen.. 1 Tsp By Mouth Q 6 Hrs As Needed Cough 14)  Meclizine Hcl 12.5 Mg Tabs (Meclizine Hcl) .Marland Kitchen.. 1 By Mouth Q 6 Hrs As Needed 15)  Promethazine Hcl 25 Mg Tabs (Promethazine Hcl) .Marland Kitchen.. 1 By Mouth Q 6 Hrs As Needed Nausea 16)  Omeprazole 20 Mg Cpdr  (Omeprazole) .... 2 By Mouth Once Daily  Allergies (verified): 1)  ! * Labetotal 2)  ! 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 12/2004, and twice aug and sept 2009 Cerebrovascular disease - 60% right MCA stenosis DIABETES MELLITUS, TYPE II (ICD-250.00) OVARIAN CYST (ICD-620.2) ALLERGIC RHINITIS (ICD-477.9) HYPERLIPIDEMIA (ICD-272.4) FAMILY HISTORY DIABETES 1ST DEGREE  RELATIVE (ICD-V18.0) RENAL INSUFFICIENCY (ICD-588.9) CIRRHOSIS (ICD-571.5) CHOLERA, 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) DEPRESSION (ICD-311) ANXIETY (ICD-300.00) CARPAL TUNNEL SYNDROME, BILATERAL (ICD-354.0) MORBID OBESITY (ICD-278.01) MIGRAINE HEADACHE (ICD-346.90) HYPOTHYROIDISM (ICD-244.9) HYPERTENSION (ICD-401.9)  Past Surgical History: Last updated: 03/13/2007 Hysterectomy 01/08 EGD (06/06/05) Stress Cardiolite (04/17/01) Echocardiogram (02/02/02) Cholecystectomy  Social History: Last updated: 01/10/2009 Former Smoker Alcohol use-no Married (second) 2 children work - Barrister's clerk for damaged cars  note:  pt work cell phone :    [phone]  Risk Factors: Smoking Status: quit (12/20/2006)  Review of Systems       all otherwise negative per pt -  Physical Exam  General:  alert and overweight-appearing.  , mild ill  Head:  normocephalic and atraumatic.   Eyes:  vision grossly intact, pupils equal, and pupils round.   Ears:  bilat tm's midl red, sinus nontender, sinus nontender Nose:  nasal dischargemucosal pallor and mucosal erythema.   Mouth:  pharyngeal erythema and fair dentition.   Neck:  supple and no masses.   Lungs:  normal  respiratory effort and normal breath sounds.   Heart:  normal rate and regular rhythm.   Abdomen:  soft and normal bowel sounds.  with mild tender epigastrium and mid abd, without guarding or rebound Msk:  no joint tenderness and no joint swelling.   Extremities:  no edema, no erythema  Neurologic:  cranial nerves II-XII intact and strength normal in all extremities.     Impression & Recommendations:  Problem # 1:  SINUSITIS- ACUTE-NOS ([id number])  Her updated medication list for this problem includes:    Azithromycin 250 Mg Tabs (Azithromycin) .Marland Kitchen... 2po qd for 1 day, then 1po qd for 4days, then stop    Hydrocodone-homatropine 5-1.5 Mg/45ml Syrp (Hydrocodone-homatropine) .Marland Kitchen... 1 tsp by mouth q 6 hrs as needed cough improved - ok to stop meds;  Problem # 2:  ABDOMINAL PAIN, EPIGASTRIC (ICD-789.06)  ? viral illness - wth n/v and tender epigastric and somewhat mid abd as well - for phenergan as needed , and labs to r/o pancreatitis and h pylori; do not feel she needs CT at this time, pending labs and clinical course; to start PPI as well   Orders: TLB-BMP (Basic Metabolic Panel-BMET) (99991111) TLB-CBC Platelet - w/Differential (85025-CBCD) TLB-Hepatic/Liver Function Pnl (80076-HEPATIC) TLB-H. Pylori Abs(Helicobacter Pylori) (A999333) TLB-Udip ONLY (81003-UDIP) TLB-Lipase (83690-LIPASE) TLB-Amylase (82150-AMYL)  Problem # 3:  HEADACHE (ICD-784.0)  Her updated medication list for this problem includes:    Ecotrin 325 Mg Tbec (Aspirin) .Marland Kitchen... 1 by mouth once daily with vertigo due to middle ear congestion - ok for mucinex to cont, and meclizine as needed   Problem # 4:  HEMATOCHEZIA (ICD-578.1)  ? colitis;  last colonscopy 2005 ;  will refer to GI for consideration for repeat colononscopy  Orders: Gastroenterology Referral (GI)  Complete Medication List: 1)  Furosemide 40 Mg Tabs (Furosemide) .... Take 1  tablet by mouth once daily as needed swelling 2)  Norvasc 10 Mg  Tabs (Amlodipine besylate) .Marland Kitchen.. 1 by mouth once daily 3)  Levothroid 112 Mcg Tabs (Levothyroxine sodium) .Marland Kitchen.. 1 by mouth once daily 4)  Ecotrin 325 Mg Tbec (Aspirin) .Marland Kitchen.. 1 by mouth once daily 5)  Simvastatin 80 Mg Tabs (Simvastatin) .Marland Kitchen.. 1 by mouth 3 x per wk 6)  Nystatin 100000 Unit/gm Powd (Nystatin) .... Use asd to affected area two times a day as needed 7)  Flexeril 5 Mg Tabs (Cyclobenzaprine hcl) .Marland Kitchen.. 1 by mouth three times a day as needed 8)  Azithromycin  250 Mg Tabs (Azithromycin) .... 2po qd for 1 day, then 1po qd for 4days, then stop 9)  Daily Multi Tabs (Multiple vitamins-minerals) .Marland Kitchen.. 1 by mouth once daily 10)  Fish Oil 1000 Mg Caps (Omega-3 fatty acids) .Marland Kitchen.. 1 by mouth once daily 11)  Clonidine Hcl 0.1 Mg Tabs (Clonidine hcl) .Marland Kitchen.. 1 by mouth two times a day 12)  Sertraline Hcl 100 Mg Tabs (Sertraline hcl) .... 2 by mouth once daily 13)  Hydrocodone-homatropine 5-1.5 Mg/64ml Syrp (Hydrocodone-homatropine) .Marland Kitchen.. 1 tsp by mouth q 6 hrs as needed cough 14)  Meclizine Hcl 12.5 Mg Tabs (Meclizine hcl) .Marland Kitchen.. 1 by mouth q 6 hrs as needed 15)  Promethazine Hcl 25 Mg Tabs (Promethazine hcl) .Marland Kitchen.. 1 by mouth q 6 hrs as needed nausea 16)  Omeprazole 20 Mg Cpdr (Omeprazole) .... 2 by mouth once daily  Other Orders: TLB-A1C / Hgb A1C (Glycohemoglobin) (83036-A1C)  Patient Instructions: 1)  Please take all new medications as prescribed - the phenergan for nausea, the omeprazole for stomach acid, and the meclizine for dizziness as needed 2)  Continue all previous medications as before this visit  3)  Please go to the Lab in the basement for your blood and/or urine tests today  4)  You will be contacted about the referral(s) to: Gastroenterology 5)  Please schedule a follow-up appointment in 6 months with CPX labs and: 6)  HbgA1C prior to visit, ICD-9: 250.02 7)  Urine Microalbumin prior to visit, ICD-9: Prescriptions: OMEPRAZOLE 20 MG CPDR (OMEPRAZOLE) 2 by mouth once daily  #180 x 3   Entered  and Authorized by:   Biagio Borg MD   Signed by:   Biagio Borg MD on 03/21/2009   Method used:   Print then Give to Patient   RxID:   [id number] PROMETHAZINE HCL 25 MG TABS (PROMETHAZINE HCL) 1 by mouth q 6 hrs as needed nausea  #40 x 1   Entered and Authorized by:   Biagio Borg MD   Signed by:   Biagio Borg MD on 03/21/2009   Method used:   Print then Give to Patient   RxID:   [id number] MECLIZINE HCL 12.5 MG TABS (MECLIZINE HCL) 1 by mouth q 6 hrs as needed  #40 x 1   Entered and Authorized by:   Biagio Borg MD   Signed by:   Biagio Borg MD on 03/21/2009   Method used:   Print then Give to Patient   RxID:   [id number]

## 2010-03-23 NOTE — Progress Notes (Signed)
Summary: ABX S/E?  Phone Note Call from Patient Call back at Home Phone [phone]   Caller: Patient Summary of Call: pt called stating that since starting Cipro she has been markedly fatigued. Pt is concerned that ABX is causing this, please advise Initial call taken by: Crissie Sickles, [place],  Jul 04, 2009 9:14 AM  Follow-up for Phone Call        very unlikely;  ok to cont the cipro for now as long as no rash or swelling Follow-up by: Biagio Borg MD,  Jul 04, 2009 12:35 PM  Additional Follow-up for Phone Call Additional follow up Details #1::        pt informed Additional Follow-up by: Crissie Sickles, [place],  Jul 04, 2009 2:30 PM

## 2010-03-23 NOTE — Procedures (Signed)
Summary: Upper Endoscopy  Patient: Heather Dawson Note: All result statuses are Final unless otherwise noted.  Tests: (1) Upper Endoscopy (EGD)   EGD Upper Endoscopy       [facility].     [place], [place]  [address]           ENDOSCOPY PROCEDURE REPORT           PATIENT:  Heather, Dawson  MR#:  [id number]     BIRTHDATE:  25-May-1955, 46 yrs. old  GENDER:  female           ENDOSCOPIST:  Sandy Salaam. Deatra Ina, MD     Referred by:           PROCEDURE DATE:  04/13/2009     PROCEDURE:  EGD, diagnostic     ASA CLASS:  Class II     INDICATIONS:  abdominal pain           MEDICATIONS:   Fentanyl 75 mcg IV, Versed 8 mg IV, glycopyrrolate     (Robinal) 0.2 mg IV, 0.6cc simethancone 0.6 cc PO     TOPICAL ANESTHETIC:  Exactacain Spray           DESCRIPTION OF PROCEDURE:   After the risks benefits and     alternatives of the procedure were thoroughly explained, informed     consent was obtained.  The [facility] [id number] endoscope was     introduced through the mouth and advanced to the third portion of     the duodenum, without limitations.  The instrument was slowly     withdrawn as the mucosa was fully examined.     <<PROCEDUREIMAGES>>           The upper, middle, and distal third of the esophagus were     carefully inspected and no abnormalities were noted. The z-line     was well seen at the GEJ. The endoscope was pushed into the fundus     which was normal including a retroflexed view. The antrum,gastric     body, first and second part of the duodenum were unremarkable (see     image1, image2, image3, image5, image6, and image7).     Retroflexed views revealed no abnormalities.    The scope was then     withdrawn from the patient and the procedure completed.           COMPLICATIONS:  None           ENDOSCOPIC IMPRESSION:     1) Normal EGD     RECOMMENDATIONS:     1) continue PPI - omeprazole     2) Call office next 2-3 days to schedule an office  appointment     for 3-4 weeks           REPEAT EXAM:  No           ______________________________     Sandy Salaam. Deatra Ina, MD           CC:  Biagio Borg, MD           n.     Lorrin Mais:   Sandy Salaam. Kaplan at 04/13/2009 12:03 PM           [place], Arbie Cookey, [id number]  Note: An exclamation mark (!) indicates a result that was not dispersed into the flowsheet. Document Creation Date: 04/13/2009 12:02 PM _______________________________________________________________________  (1) Order result status: Final Collection  or observation date-time: 04/13/2009 11:59 Requested date-time:  Receipt date-time:  Reported date-time:  Referring Physician:   Ordering Physician: Erskine Emery [id number]) Specimen Source:  Source: Tawanna Cooler Order Number: [id number] Lab site:

## 2010-03-23 NOTE — Assessment & Plan Note (Signed)
Summary: COLD SYMPTOMS  STC   Vital Signs:  Patient profile:   55 year old female Height:      62 inches Weight:      219 pounds BMI:     40.20 O2 Sat:      97 % on Room air Temp:     97.1 degrees F oral Pulse rate:   66 / minute BP sitting:   122 / 82  (left arm) Cuff size:   regular  Vitals Entered ByMarland Kitchen Shirlean Mylar Ewing (March 15, 2009 2:25 PM)  O2 Flow:  Room air CC: headache,nauseated,cough,dizzy/RE   CC:  headache, nauseated, cough, and dizzy/RE.  History of Present Illness: here with 3 days headache, nasusea, dizzy with facial pain, pressure, fever and greenish d/c;  has mild ST, but Pt denies CP, sob, doe, wheezing, orthopnea, pnd, worsening LE edema, palps, dizziness or syncope  Pt denies new neuro symptoms such as headache, facial or extremity weakness   Pt denies polydipsia, polyuria, or low sugar symptoms such as shakiness improved with eating.  Overall good compliance with meds, trying to follow low chol, DM diet, wt stable, little excercise however   Problems Prior to Update: 1)  Polyuria  (ICD-788.42) 2)  Sinusitis- Acute-nos  (ICD-461.9) 3)  Ganglion Cyst, Tendon Sheath  (ICD-727.42) 4)  Headache  (ICD-784.0) 5)  Contusion, Lower Leg  (ICD-924.10) 6)  Contact Dermatitis  (ICD-692.9) 7)  Abdominal Pain, Generalized  (ICD-789.07) 8)  Preventive Health Care  (ICD-V70.0) 9)  Cellulitis and Abscess of Other Specified Site  (ICD-682.8) 10)  Back Pain  (ICD-724.5) 11)  Rash-nonvesicular  (ICD-782.1) 12)  Bradycardia  (ICD-427.89) 13)  Weakness  (ICD-780.79) 14)  Cellulitis, Trunk  (ICD-682.2) 15)  Encounter For Long-term Use of Other Medications  (ICD-V58.69) 16)  Skin Rash  (ICD-782.1) 17)  Transient Ischemic Attack  (ICD-435.9) 18)  Carpal Tunnel Syndrome  (ICD-354.0) 19)  Vertigo  (ICD-780.4) 20)  Otitis Media, Serous, Right  (ICD-381.4) 21)  Esophagitis  (ICD-530.10) 22)  Dysuria  (ICD-788.1) 23)  Vitamin B12 Deficiency  (ICD-266.2) 24)  Fatigue   (ICD-780.79) 25)  Abdominal Pain, Epigastric  (ICD-789.06) 26)  Shingles, Hx of  (ICD-V13.8) 27)  Common Migraine  (ICD-346.10) 28)  Gerd  (ICD-530.81) 29)  Transient Ischemic Attacks, Hx of  (ICD-V12.50) 30)  Diabetes Mellitus, Type II  (ICD-250.00) 31)  Ovarian Cyst  (ICD-620.2) 32)  Allergic Rhinitis  (ICD-477.9) 33)  Hyperlipidemia  (ICD-272.4) 34)  Family History Diabetes 1st Degree Relative  (ICD-V18.0) 36)  Renal Insufficiency  (ICD-588.9) 36)  Cirrhosis  (ICD-571.5) 37)  Cholera, 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 Take 1  Tablet By Mouth Once Daily As Needed Swelling 2)  Norvasc 10 Mg  Tabs (Amlodipine Besylate) .Marland Kitchen.. 1 By Mouth Once Daily 3)  Levothroid 112 Mcg Tabs (Levothyroxine Sodium) .Marland Kitchen.. 1 By Mouth Once Daily 4)  Ecotrin 325 Mg Tbec (Aspirin) .Marland Kitchen.. 1 By Mouth Once Daily 5)  Simvastatin 80 Mg Tabs (Simvastatin) .Marland Kitchen.. 1 By Mouth 3 X Per Wk 6)  Nystatin 100000 Unit/gm Powd (Nystatin) .... Use Asd To Affected Area Two Times A Day As Needed 7)  Flexeril 5 Mg Tabs (Cyclobenzaprine Hcl) .Marland KitchenMarland KitchenMarland Kitchen 1  By Mouth Three Times A Day As Needed 8)  Doxycycline Hyclate 100 Mg Caps (Doxycycline Hyclate) .Marland Kitchen.. 1 By Mouth Two Times A Day 9)  Daily Multi  Tabs (Multiple Vitamins-Minerals) .Marland Kitchen.. 1 By Mouth Once Daily 10)  Fish Oil 1000 Mg Caps (Omega-3 Fatty Acids) .Marland Kitchen.. 1 By Mouth Once Daily 11)  Clonidine Hcl 0.1 Mg Tabs (Clonidine Hcl) .Marland Kitchen.. 1 By Mouth Two Times A Day 12)  Sertraline Hcl 100 Mg Tabs (Sertraline Hcl) .... 2 By Mouth Once Daily 13)  Hydrocodone-Homatropine 5-1.5 Mg/51ml Syrp (Hydrocodone-Homatropine) .Marland Kitchen.. 1 Tsp By Mouth Q 6 Hrs As Needed Cough  Current Medications  (verified): 1)  Furosemide 40 Mg Tabs (Furosemide) .... Take 1  Tablet By Mouth Once Daily As Needed Swelling 2)  Norvasc 10 Mg  Tabs (Amlodipine Besylate) .Marland Kitchen.. 1 By Mouth Once Daily 3)  Levothroid 112 Mcg Tabs (Levothyroxine Sodium) .Marland Kitchen.. 1 By Mouth Once Daily 4)  Ecotrin 325 Mg Tbec (Aspirin) .Marland Kitchen.. 1 By Mouth Once Daily 5)  Simvastatin 80 Mg Tabs (Simvastatin) .Marland Kitchen.. 1 By Mouth 3 X Per Wk 6)  Nystatin 100000 Unit/gm Powd (Nystatin) .... Use Asd To Affected Area Two Times A Day As Needed 7)  Flexeril 5 Mg Tabs (Cyclobenzaprine Hcl) .Marland Kitchen.. 1 By Mouth Three Times A Day As Needed 8)  Azithromycin 250 Mg Tabs (Azithromycin) .... 2po Qd For 1 Day, Then 1po Qd For 4days, Then Stop 9)  Daily Multi  Tabs (Multiple Vitamins-Minerals) .Marland Kitchen.. 1 By Mouth Once Daily 10)  Fish Oil 1000 Mg Caps (Omega-3 Fatty Acids) .Marland Kitchen.. 1 By Mouth Once Daily 11)  Clonidine Hcl 0.1 Mg Tabs (Clonidine Hcl) .Marland Kitchen.. 1 By Mouth Two Times A Day 12)  Sertraline Hcl 100 Mg Tabs (Sertraline Hcl) .... 2 By Mouth Once Daily 13)  Hydrocodone-Homatropine 5-1.5 Mg/88ml Syrp (Hydrocodone-Homatropine) .Marland Kitchen.. 1 Tsp By Mouth Q 6 Hrs As Needed Cough  Allergies (verified): 1)  ! * Labetotal 2)  ! 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 12/2004, and twice aug and sept 2009 Cerebrovascular disease - 60% right MCA stenosis DIABETES MELLITUS, TYPE II (ICD-250.00) OVARIAN CYST (ICD-620.2) ALLERGIC RHINITIS (ICD-477.9) HYPERLIPIDEMIA (ICD-272.4) FAMILY HISTORY DIABETES 1ST DEGREE RELATIVE (ICD-V18.0) RENAL INSUFFICIENCY (ICD-588.9) CIRRHOSIS (ICD-571.5) CHOLERA, 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) DEPRESSION (ICD-311) ANXIETY (ICD-300.00) CARPAL TUNNEL SYNDROME, BILATERAL (ICD-354.0) MORBID OBESITY (ICD-278.01) MIGRAINE HEADACHE (ICD-346.90) HYPOTHYROIDISM (ICD-244.9) HYPERTENSION (ICD-401.9)  Past Surgical History: Last updated: 03/13/2007 Hysterectomy 01/08 EGD (06/06/05) Stress Cardiolite (04/17/01) Echocardiogram (02/02/02) Cholecystectomy  Social History: Last updated: 01/10/2009 Former Smoker Alcohol use-no Married (second) 2 children work - Barrister's clerk for damaged cars  note:  pt work cell phone :    [phone]  Risk Factors: Smoking Status: quit (12/20/2006)  Review of Systems       all otherwise negative per pt   Physical Exam  General:  alert and overweight-appearing.  , mild ill  Head:  normocephalic and atraumatic.   Eyes:  vision grossly intact, pupils equal, and pupils round.   Ears:  bilat tm's red, sinus tender bialt Nose:  nasal dischargemucosal pallor and mucosal erythema.   Mouth:  pharyngeal erythema and fair dentition.   Neck:  supple and no masses.   Lungs:  normal respiratory effort and normal breath sounds.   Heart:  normal rate and regular rhythm.   Extremities:  no edema, no erythema    Impression & Recommendations:  Problem # 1:  SINUSITIS- ACUTE-NOS (ICD-461.9)  Her updated medication list for this problem includes:    Azithromycin 250 Mg Tabs (Azithromycin) .Marland Kitchen... 2po qd for 1 day, then 1po qd for 4days, then stop    Hydrocodone-homatropine 5-1.5 Mg/64ml Syrp (Hydrocodone-homatropine) .Marland Kitchen... 1 tsp by mouth q 6 hrs as needed cough treat as above, f/u any worsening signs or symptoms   Problem # 2:  DIABETES MELLITUS, TYPE II (ICD-250.00)  Her updated medication list for this problem includes:    Ecotrin 325 Mg Tbec (Aspirin) .Marland Kitchen... 1 by mouth once daily  Labs Reviewed: Creat: 1.3 (01/10/2009)    Reviewed HgBA1c results: 5.9 (01/10/2009)  5.8 (07/07/2008) stable overall by hx and exam, ok to continue meds/tx as is ,  no need OHA at this time  Problem # 3:  HYPERTENSION (ICD-401.9)  Her updated medication list for this problem includes:    Furosemide 40 Mg Tabs (Furosemide) .Marland Kitchen... Take 1  tablet by mouth once daily as needed swelling    Norvasc 10 Mg Tabs (Amlodipine besylate) .Marland Kitchen... 1 by mouth once daily    Clonidine Hcl 0.1 Mg Tabs (Clonidine hcl) .Marland Kitchen... 1 by mouth two times a day  BP today: 122/82 Prior BP: 134/80 (02/17/2009)  Labs Reviewed: K+: 3.7 (01/10/2009) Creat: : 1.3 (01/10/2009)   Chol: 229 (01/10/2009)   HDL: 46.50 (01/10/2009)   LDL: 114 (07/07/2008)   TG: 181.0 (01/10/2009) stable overall by hx and exam, ok to continue meds/tx as is   Complete Medication List: 1)  Furosemide 40 Mg Tabs (Furosemide) .... Take 1  tablet by mouth once daily as needed swelling 2)  Norvasc 10 Mg Tabs (Amlodipine besylate) .Marland Kitchen.. 1 by mouth once daily 3)  Levothroid 112 Mcg Tabs (Levothyroxine sodium) .Marland Kitchen.. 1 by mouth once daily 4)  Ecotrin 325 Mg Tbec (Aspirin) .Marland Kitchen.. 1 by mouth once daily 5)  Simvastatin 80 Mg Tabs (Simvastatin) .Marland Kitchen.. 1 by mouth 3 x per wk 6)  Nystatin 100000 Unit/gm Powd (Nystatin) .... Use asd to affected area two times a day as needed 7)  Flexeril 5 Mg Tabs (Cyclobenzaprine hcl) .Marland Kitchen.. 1 by mouth three times a day as needed 8)  Azithromycin 250 Mg Tabs (Azithromycin) .... 2po qd for 1 day, then 1po qd for 4days, then stop 9)  Daily Multi Tabs (Multiple vitamins-minerals) .Marland Kitchen.. 1 by mouth once daily 10)  Fish Oil 1000 Mg Caps (Omega-3 fatty acids) .Marland Kitchen.. 1 by mouth once daily 11)  Clonidine Hcl 0.1 Mg Tabs (Clonidine hcl) .Marland Kitchen.. 1 by mouth two times a day 12)  Sertraline Hcl 100 Mg Tabs (Sertraline hcl) .... 2 by mouth once daily 13)  Hydrocodone-homatropine 5-1.5 Mg/55ml Syrp (Hydrocodone-homatropine) .Marland Kitchen.. 1 tsp by mouth q 6 hrs as needed cough  Patient Instructions: 1)  Please take all new medications as prescribed 2)  Continue all previous medications as before this visit  3)  You can also use  Mucinex OTC or it's generic for congestion Prescriptions: AZITHROMYCIN 250 MG TABS (AZITHROMYCIN) 2po qd for 1 day, then 1po qd for 4days, then stop  #6 x 1   Entered and Authorized by:   Biagio Borg MD   Signed by:   Biagio Borg MD on 03/15/2009   Method used:   Print then Give to Patient   RxID:   [id number]

## 2010-03-23 NOTE — Letter (Signed)
Summary: Out of Work  [facility] [address], [place] [address]   Phone: [phone]  Fax: [phone]    September 08, 2009   Employee:  Allex P Wist    To Whom It May Concern:   For Medical reasons, please excuse the above named employee from work for the following dates:  Start:   09/06/2009  End:   09/12/2009  Return to work Monday September 12, 2009  If you need additional information, please feel free to contact our office.         Sincerely,    Dr. [name]

## 2010-03-23 NOTE — Assessment & Plan Note (Signed)
Summary: COUGH---STC   Vital Signs:  Patient profile:   55 year old female Height:      63 inches Weight:      214.50 pounds BMI:     38.13 O2 Sat:      96 % on Room air Temp:     97.7 degrees F oral Pulse rate:   95 / minute BP sitting:   140 / 82  (left arm) Cuff size:   large  Vitals Entered By: Shirlean Mylar Ewing CMA Deborra Medina) (September 08, 2009 9:57 AM)  O2 Flow:  Room air CC: Cough, chest and head congestion for 5 days/RE   Primary Care Provider:  [name], MD  CC:  Cough and chest and head congestion for 5 days/RE.  History of Present Illness: here with acute onset x 4 days (onset the day after taking care of the grandkids for a day who did not seem ill) evolving URI symptoms of HA, fever, eye itch and soreness, bilat earache, mild ST and now much worsening prod cough greenish sputum and sharp fleeting but severe CP only with cough (non pleuritic, nonexertional); Pt denies sob, doe, wheezing, orthopnea, pnd, worsening LE edema, palps, dizziness or syncope  Has tried they generic type tussionex and not working for the cough.  HA pain today about 7/10.  No abd pain, n/v/d.  No chills, rash, joint pains.    Problems Prior to Update: 1)  Chest Pain  (ICD-786.50) 2)  Bronchitis-acute  (ICD-466.0) 3)  Preventive Health Care  (ICD-V70.0) 4)  Uti  (ICD-599.0) 5)  Fatigue  (ICD-780.79) 6)  Leg Cramps  (ICD-729.82) 7)  Leg Pain, Bilateral  (ICD-729.5) 8)  Back Pain  (ICD-724.5) 9)  Diarrhea  (ICD-787.91) 10)  Esophageal Reflux  (ICD-530.81) 11)  Hematochezia  (ICD-578.1) 12)  Abdominal Pain, Epigastric  (ICD-789.06) 13)  Polyuria  (ICD-788.42) 14)  Sinusitis- Acute-nos  (ICD-461.9) 15)  Ganglion Cyst, Tendon Sheath  (ICD-727.42) 16)  Headache  (ICD-784.0) 17)  Contusion, Lower Leg  (ICD-924.10) 18)  Contact Dermatitis  (ICD-692.9) 19)  Abdominal Pain, Generalized  (ICD-789.07) 20)  Preventive Health Care  (ICD-V70.0) 21)  Cellulitis and Abscess of Other Specified Site   (ICD-682.8) 22)  Back Pain  (ICD-724.5) 23)  Rash-nonvesicular  (ICD-782.1) 24)  Bradycardia  (ICD-427.89) 25)  Weakness  (ICD-780.79) 26)  Cellulitis, Trunk  (ICD-682.2) 27)  Encounter For Long-term Use of Other Medications  (ICD-V58.69) 28)  Skin Rash  (ICD-782.1) 29)  Transient Ischemic Attack  (ICD-435.9) 30)  Carpal Tunnel Syndrome  (ICD-354.0) 31)  Vertigo  (ICD-780.4) 32)  Otitis Media, Serous, Right  (ICD-381.4) 33)  Esophagitis  (ICD-530.10) 34)  Dysuria  (ICD-788.1) 35)  Vitamin B12 Deficiency  (ICD-266.2) 36)  Fatigue  (ICD-780.79) 37)  Abdominal Pain, Epigastric  (ICD-789.06) 38)  Shingles, Hx of  (ICD-V13.8) 39)  Common Migraine  (ICD-346.10) 40)  Gerd  (ICD-530.81) 41)  Transient Ischemic Attacks, Hx of  (ICD-V12.50) 42)  Diabetes Mellitus, Type II  (ICD-250.00) 43)  Ovarian Cyst  (ICD-620.2) 44)  Allergic Rhinitis  (ICD-477.9) 45)  Hyperlipidemia  (ICD-272.4) 46)  Family History Diabetes 1st Degree Relative  (ICD-V18.0) 90)  Renal Insufficiency  (ICD-588.9) 48)  Cirrhosis  (ICD-571.5) 49)  Cholera, 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 Take 1  Tablet By Mouth Once Daily As Needed Swelling 2)  Norvasc 10 Mg  Tabs (Amlodipine Besylate) .Marland Kitchen.. 1 By Mouth Once Daily 3)  Levothroid 125 Mcg Tabs (Levothyroxine Sodium) .Marland Kitchen.. 1po Once Daily 4)  Ecotrin 325 Mg Tbec (Aspirin) .Marland Kitchen.. 1 By Mouth Once Daily 5)  Simvastatin 80 Mg Tabs (Simvastatin) .Marland Kitchen.. 1 By Mouth 3 X Per Wk 6)  Nystatin 100000 Unit/gm Powd (Nystatin) .... Use Asd To Affected Area Two Times A Day As Needed 7)  Flexeril 5 Mg Tabs (Cyclobenzaprine Hcl) .Marland Kitchen.. 1 By Mouth Three Times A Day As Needed 8)   Daily Multi  Tabs (Multiple Vitamins-Minerals) .Marland Kitchen.. 1 By Mouth Once Daily 9)  Fish Oil 1000 Mg Caps (Omega-3 Fatty Acids) .Marland Kitchen.. 1 By Mouth Once Daily 10)  Clonidine Hcl 0.1 Mg Tabs (Clonidine Hcl) .Marland Kitchen.. 1 By Mouth Two Times A Day 11)  Sertraline Hcl 100 Mg Tabs (Sertraline Hcl) .... 2 By Mouth Once Daily 12)  Meclizine Hcl 12.5 Mg Tabs (Meclizine Hcl) .Marland Kitchen.. 1 By Mouth Q 6 Hrs As Needed 13)  Omeprazole 20 Mg Cpdr (Omeprazole) .... 2 By Mouth Once Daily 14)  B-100 Complex  Tabs (Vitamins-Lipotropics) .... Once Daily 15)  Vitamin D 400 Unit Tabs (Cholecalciferol) .... Once Daily 16)  Humira Pen 40 Mg/0.40ml Kit (Adalimumab) 17)  Prednisone 10 Mg Tabs (Prednisone) .... 3po Qd For 3days, Then 2po Qd For 3days, Then 1po Qd For 3days, Then Stop 18)  Hydrocodone-Acetaminophen 7.5-325 Mg Tabs (Hydrocodone-Acetaminophen) .Marland Kitchen.. 1 By Mouth Q 6 Hrs As Needed Pain  Current Medications (verified): 1)  Furosemide 40 Mg Tabs (Furosemide) .... Take 1  Tablet By Mouth Once Daily As Needed Swelling 2)  Norvasc 10 Mg  Tabs (Amlodipine Besylate) .Marland Kitchen.. 1 By Mouth Once Daily 3)  Levothroid 125 Mcg Tabs (Levothyroxine Sodium) .Marland Kitchen.. 1po Once Daily 4)  Ecotrin 325 Mg Tbec (Aspirin) .Marland Kitchen.. 1 By Mouth Once Daily 5)  Simvastatin 80 Mg Tabs (Simvastatin) .Marland Kitchen.. 1 By Mouth 3 X Per Wk 6)  Nystatin 100000 Unit/gm Powd (Nystatin) .... Use Asd To Affected Area Two Times A Day As Needed 7)  Flexeril 5 Mg Tabs (Cyclobenzaprine Hcl) .Marland Kitchen.. 1 By Mouth Three Times A Day As Needed 8)  Daily Multi  Tabs (Multiple Vitamins-Minerals) .Marland Kitchen.. 1 By Mouth Once Daily 9)  Fish Oil 1000 Mg Caps (Omega-3 Fatty Acids) .Marland Kitchen.. 1 By Mouth Once Daily 10)  Clonidine Hcl 0.1 Mg Tabs (Clonidine Hcl) .Marland Kitchen.. 1 By Mouth Two Times A Day 11)  Sertraline Hcl 100 Mg Tabs (Sertraline Hcl) .... 2 By Mouth Once Daily 12)  Meclizine Hcl 12.5 Mg Tabs (Meclizine Hcl) .Marland Kitchen.. 1 By Mouth Q 6 Hrs As Needed 13)  Omeprazole 20 Mg Cpdr (Omeprazole) .... 2 By Mouth Once Daily 14)  B-100  Complex  Tabs (Vitamins-Lipotropics) .... Once Daily 15)  Vitamin D 400 Unit Tabs (Cholecalciferol) .... Once Daily 16)  Humira Pen 40 Mg/0.35ml Kit (Adalimumab) 17)  Hydrocodone-Acetaminophen 7.5-325 Mg Tabs (Hydrocodone-Acetaminophen) .Marland Kitchen.. 1 By Mouth Q 6 Hrs As Needed Pain 18)  Azithromycin 250 Mg Tabs (Azithromycin) .... 2po Qd For 1 Day, Then 1po Qd For 4days, Then Stop 19)  Tessalon Perles 100 Mg Caps (Benzonatate) .Marland Kitchen.. 1-2 By Mouth Three Times A Day As Needed  Allergies (verified): 1)  ! * Labetotal 2)  ! Ceftin  Past History:  Past Medical History: Last updated: 11/18/2007 ESOPHAGITIS (ICD-530.10) URI (ICD-465.9) DYSURIA (ICD-788.1)  VITAMIN B12 DEFICIENCY (ICD-266.2) FATIGUE (ICD-780.79) ABDOMINAL PAIN, EPIGASTRIC (ICD-789.06) VIRAL GASTROENTERITIS (ICD-008.8) SHINGLES, HX OF (ICD-V13.8) COMMON MIGRAINE (ICD-346.10) GERD (ICD-530.81) TRANSIENT ISCHEMIC ATTACKS, HX OF (ICD-V12.50) - right brain 12/2004, and twice aug and sept 2009 Cerebrovascular disease - 60% right MCA stenosis DIABETES MELLITUS, TYPE II (ICD-250.00) OVARIAN CYST (ICD-620.2) ALLERGIC RHINITIS (ICD-477.9) HYPERLIPIDEMIA (ICD-272.4) FAMILY HISTORY DIABETES 1ST DEGREE RELATIVE (ICD-V18.0) RENAL INSUFFICIENCY (ICD-588.9) CIRRHOSIS (ICD-571.5) CHOLERA, 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) DEPRESSION (ICD-311) ANXIETY (ICD-300.00) CARPAL TUNNEL SYNDROME, BILATERAL (ICD-354.0) MORBID OBESITY (ICD-278.01) MIGRAINE HEADACHE (ICD-346.90) HYPOTHYROIDISM (ICD-244.9) HYPERTENSION (ICD-401.9)  Past Surgical History: Last updated: 04/12/2009 Hysterectomy 01/08 EGD (06/06/05) Stress Cardiolite (04/17/01) Echocardiogram (02/02/02) Cholecystectomy Neck surgery  Social History: Last updated: 01/10/2009 Former Smoker Alcohol use-no Married (second) 2 children work - Barrister's clerk for damaged cars  note:  pt work cell phone :    [phone]  Risk Factors: Smoking Status: quit (12/20/2006)  Review of  Systems       all otherwise negative per pt -    Physical Exam  General:  alert and overweight-appearing.  , mod ill appearing Head:  normocephalic and atraumatic.   Eyes:  vision grossly intact, pupils equal, and pupils round.   Ears:  bilat tm's marked erythema and mild bulging Nose:  nasal dischargemucosal pallor and mucosal edema.   Mouth:  pharyngeal erythema and fair dentition.   Neck:  supple and cervical lymphadenopathy.   Lungs:  normal respiratory effort and normal breath sounds.   Heart:  normal rate and regular rhythm.   Msk:  has some chest wall tenderness upper chest bilat Extremities:  no edema, no erythema    Impression & Recommendations:  Problem # 1:  BRONCHITIS-ACUTE (ICD-466.0)  Her updated medication list for this problem includes:    Azithromycin 250 Mg Tabs (Azithromycin) .Marland Kitchen... 2po qd for 1 day, then 1po qd for 4days, then stop    Tessalon Perles 100 Mg Caps (Benzonatate) .Marland Kitchen... 1-2 by mouth three times a day as needed treat as above, f/u any worsening signs or symptoms   Problem # 2:  CHEST PAIN (ICD-786.50)  c/w msk due to cough - for toradol IM today, and tylenol as needed , reassured  Orders: Ketorolac-Toradol 15mg  [id number]) Admin of Therapeutic Inj  intramuscular or subcutaneous [id number])  Problem # 3:  HYPERTENSION (ICD-401.9)  Her updated medication list for this problem includes:    Furosemide 40 Mg Tabs (Furosemide) .Marland Kitchen... Take 1  tablet by mouth once daily as needed swelling    Norvasc 10 Mg Tabs (Amlodipine besylate) .Marland Kitchen... 1 by mouth once daily    Clonidine Hcl 0.1 Mg Tabs (Clonidine hcl) .Marland Kitchen... 1 by mouth two times a day stable overall by hx and exam, ok to continue meds/tx as is   BP today: 140/82 Prior BP: 120/82 (08/30/2009)  Labs Reviewed: K+: 4.5 (08/26/2009) Creat: : 1.1 (08/26/2009)   Chol: 203 (08/26/2009)   HDL: 35.90 (08/26/2009)   LDL: 114 (07/07/2008)   TG: 243.0 (08/26/2009)  Complete Medication List: 1)  Furosemide 40 Mg  Tabs (Furosemide) .... Take 1  tablet by mouth once daily as needed swelling 2)  Norvasc 10 Mg Tabs (Amlodipine besylate) .Marland Kitchen.. 1 by mouth once daily 3)  Levothroid 125 Mcg Tabs (Levothyroxine sodium) .Marland Kitchen.. 1po once daily 4)  Ecotrin 325 Mg Tbec (Aspirin) .Marland Kitchen.. 1 by mouth once daily 5)  Simvastatin 80 Mg Tabs (Simvastatin) .Marland Kitchen.. 1 by mouth 3 x per wk 6)  Nystatin 100000 Unit/gm Powd (Nystatin) .... Use asd to affected area  two times a day as needed 7)  Flexeril 5 Mg Tabs (Cyclobenzaprine hcl) .Marland Kitchen.. 1 by mouth three times a day as needed 8)  Daily Multi Tabs (Multiple vitamins-minerals) .Marland Kitchen.. 1 by mouth once daily 9)  Fish Oil 1000 Mg Caps (Omega-3 fatty acids) .Marland Kitchen.. 1 by mouth once daily 10)  Clonidine Hcl 0.1 Mg Tabs (Clonidine hcl) .Marland Kitchen.. 1 by mouth two times a day 11)  Sertraline Hcl 100 Mg Tabs (Sertraline hcl) .... 2 by mouth once daily 12)  Meclizine Hcl 12.5 Mg Tabs (Meclizine hcl) .Marland Kitchen.. 1 by mouth q 6 hrs as needed 13)  Omeprazole 20 Mg Cpdr (Omeprazole) .... 2 by mouth once daily 14)  B-100 Complex Tabs (Vitamins-lipotropics) .... Once daily 15)  Vitamin D 400 Unit Tabs (Cholecalciferol) .... Once daily 16)  Humira Pen 40 Mg/0.10ml Kit (Adalimumab) 17)  Hydrocodone-acetaminophen 7.5-325 Mg Tabs (Hydrocodone-acetaminophen) .Marland Kitchen.. 1 by mouth q 6 hrs as needed pain 18)  Azithromycin 250 Mg Tabs (Azithromycin) .... 2po qd for 1 day, then 1po qd for 4days, then stop 19)  Tessalon Perles 100 Mg Caps (Benzonatate) .Marland Kitchen.. 1-2 by mouth three times a day as needed  Patient Instructions: 1)  Please take all new medications as prescribed - the antibiotic , and cough med 2)  You are given the pain shot today 3)  Continue all previous medications as before this visit  4)  You can also use Mucinex OTC or it's generic for congestion  5)  You can also take Tylenol as needed in addition to your current meds 6)  Please schedule a follow-up appointment as needed. 7)  You are given the work note  today Prescriptions: TESSALON PERLES 100 MG CAPS (BENZONATATE) 1-2 by mouth three times a day as needed  #60 x 1   Entered and Authorized by:   Biagio Borg MD   Signed by:   Biagio Borg MD on 09/08/2009   Method used:   Print then Give to Patient   RxID:   [id number] AZITHROMYCIN 250 MG TABS (AZITHROMYCIN) 2po qd for 1 day, then 1po qd for 4days, then stop  #6 x 1   Entered and Authorized by:   Biagio Borg MD   Signed by:   Biagio Borg MD on 09/08/2009   Method used:   Print then Give to Patient   RxID:   [id number]    Medication Administration  Injection # 1:    Medication: Ketorolac-Toradol 15mg     Diagnosis: CHEST PAIN (ICD-786.50)    Route: IM    Site: RUOQ gluteus    Exp Date: 02/2011    Lot #: [id number]    Mfr: NovaPlus    Given by: Shirlean Mylar Ewing CMA Deborra Medina) (September 08, 2009 10:55 AM)  Orders Added: 1)  Est. Patient Level IV [id number] 2)  Ketorolac-Toradol 15mg  [J1885] 3)  Admin of Therapeutic Inj  intramuscular or subcutaneous [id number]

## 2010-03-23 NOTE — Assessment & Plan Note (Signed)
Summary: BACK PAIN/NWS   Vital Signs:  Patient profile:   55 year old female Height:      62 inches Weight:      217.50 pounds BMI:     39.93 O2 Sat:      95 % on Room air Temp:     98.4 degrees F oral Pulse rate:   77 / minute BP sitting:   122 / 80  (left arm) Cuff size:   large  Vitals Entered By: Shirlean Mylar Ewing CMA Deborra Medina) (August 18, 2009 3:49 PM)  O2 Flow:  Room air CC: Back Pain for 1 week/RE   Primary Care Provider:  [name], MD  CC:  Back Pain for 1 week/RE.  History of Present Illness: here with 1 wk pain to the back , points to the area jsut below the braline at the spine , persistent with severe flares in the past wk; radiates somewhat more distally to the upper lumbar spine, no radiation, not made worse with deep breathing or position change such as bending or twisting, but states random flares of pain seem to take her breath away.  Further hx is such that onset occured while driving last Sun to church and into the evening, but no pain on mon or tues or wed this wk, then recurred today and becme very concerned.  No bowel or bladder change, or LE pain, weak, numb;  no fever, wt loss, night sweats, gait change or falls or other injury.  Recent urine cx neg.  Denies polydipsia, polyuria.  Problems Prior to Update: 1)  Uti  (ICD-599.0) 2)  Fatigue  (ICD-780.79) 3)  Leg Cramps  (ICD-729.82) 4)  Leg Pain, Bilateral  (ICD-729.5) 5)  Back Pain  (ICD-724.5) 6)  Diarrhea  (ICD-787.91) 7)  Esophageal Reflux  (ICD-530.81) 8)  Hematochezia  (ICD-578.1) 9)  Abdominal Pain, Epigastric  (ICD-789.06) 10)  Polyuria  (ICD-788.42) 11)  Sinusitis- Acute-nos  (ICD-461.9) 12)  Ganglion Cyst, Tendon Sheath  (ICD-727.42) 13)  Headache  (ICD-784.0) 14)  Contusion, Lower Leg  (ICD-924.10) 15)  Contact Dermatitis  (ICD-692.9) 16)  Abdominal Pain, Generalized  (ICD-789.07) 17)  Preventive Health Care  (ICD-V70.0) 18)  Cellulitis and Abscess of Other Specified Site  (ICD-682.8) 19)  Back  Pain  (ICD-724.5) 20)  Rash-nonvesicular  (ICD-782.1) 21)  Bradycardia  (ICD-427.89) 22)  Weakness  (ICD-780.79) 23)  Cellulitis, Trunk  (ICD-682.2) 24)  Encounter For Long-term Use of Other Medications  (ICD-V58.69) 25)  Skin Rash  (ICD-782.1) 26)  Transient Ischemic Attack  (ICD-435.9) 27)  Carpal Tunnel Syndrome  (ICD-354.0) 28)  Vertigo  (ICD-780.4) 29)  Otitis Media, Serous, Right  (ICD-381.4) 30)  Esophagitis  (ICD-530.10) 31)  Dysuria  (ICD-788.1) 32)  Vitamin B12 Deficiency  (ICD-266.2) 33)  Fatigue  (ICD-780.79) 34)  Abdominal Pain, Epigastric  (ICD-789.06) 35)  Shingles, Hx of  (ICD-V13.8) 36)  Common Migraine  (ICD-346.10) 37)  Gerd  (ICD-530.81) 38)  Transient Ischemic Attacks, Hx of  (ICD-V12.50) 39)  Diabetes Mellitus, Type II  (ICD-250.00) 40)  Ovarian Cyst  (ICD-620.2) 41)  Allergic Rhinitis  (ICD-477.9) 42)  Hyperlipidemia  (ICD-272.4) 43)  Family History Diabetes 1st Degree Relative  (ICD-V18.0) 27)  Renal Insufficiency  (ICD-588.9) 45)  Cirrhosis  (ICD-571.5) 46)  Cholera, 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 Take 1  Tablet By Mouth Once Daily As Needed Swelling 2)  Norvasc 10 Mg  Tabs (Amlodipine Besylate) .Marland Kitchen.. 1 By Mouth Once Daily 3)  Levothroid 112 Mcg Tabs (Levothyroxine Sodium) .Marland Kitchen.. 1 By Mouth Once Daily 4)  Ecotrin 325 Mg Tbec (Aspirin) .Marland Kitchen.. 1 By Mouth Once Daily 5)  Simvastatin 80 Mg Tabs (Simvastatin) .Marland Kitchen.. 1 By Mouth 3 X Per Wk 6)  Nystatin 100000 Unit/gm Powd (Nystatin) .... Use Asd To Affected Area Two Times A Day As Needed 7)  Flexeril 5 Mg Tabs (Cyclobenzaprine Hcl) .Marland Kitchen.. 1 By Mouth Three Times A Day As Needed 8)  Daily Multi   Tabs (Multiple Vitamins-Minerals) .Marland Kitchen.. 1 By Mouth Once Daily 9)  Fish Oil 1000 Mg Caps (Omega-3 Fatty Acids) .Marland Kitchen.. 1 By Mouth Once Daily 10)  Clonidine Hcl 0.1 Mg Tabs (Clonidine Hcl) .Marland Kitchen.. 1 By Mouth Two Times A Day 11)  Sertraline Hcl 100 Mg Tabs (Sertraline Hcl) .... 2 By Mouth Once Daily 12)  Hydrocodone-Homatropine 5-1.5 Mg/78ml Syrp (Hydrocodone-Homatropine) .Marland Kitchen.. 1 Tsp By Mouth Q 6 Hrs As Needed Cough 13)  Meclizine Hcl 12.5 Mg Tabs (Meclizine Hcl) .Marland Kitchen.. 1 By Mouth Q 6 Hrs As Needed 14)  Promethazine Hcl 25 Mg Tabs (Promethazine Hcl) .Marland Kitchen.. 1 By Mouth Q 6 Hrs As Needed Nausea 15)  Omeprazole 20 Mg Cpdr (Omeprazole) .... 2 By Mouth Once Daily 16)  B-100 Complex  Tabs (Vitamins-Lipotropics) .... Once Daily 17)  Vitamin D 400 Unit Tabs (Cholecalciferol) .... Once Daily 18)  Humira Pen 40 Mg/0.24ml Kit (Adalimumab) 19)  Ciprofloxacin Hcl 500 Mg Tabs (Ciprofloxacin Hcl) .Marland Kitchen.. 1 By Mouth Two Times A Day  Current Medications (verified): 1)  Furosemide 40 Mg Tabs (Furosemide) .... Take 1  Tablet By Mouth Once Daily As Needed Swelling 2)  Norvasc 10 Mg  Tabs (Amlodipine Besylate) .Marland Kitchen.. 1 By Mouth Once Daily 3)  Levothroid 112 Mcg Tabs (Levothyroxine Sodium) .Marland Kitchen.. 1 By Mouth Once Daily 4)  Ecotrin 325 Mg Tbec (Aspirin) .Marland Kitchen.. 1 By Mouth Once Daily 5)  Simvastatin 80 Mg Tabs (Simvastatin) .Marland Kitchen.. 1 By Mouth 3 X Per Wk 6)  Nystatin 100000 Unit/gm Powd (Nystatin) .... Use Asd To Affected Area Two Times A Day As Needed 7)  Flexeril 5 Mg Tabs (Cyclobenzaprine Hcl) .Marland Kitchen.. 1 By Mouth Three Times A Day As Needed 8)  Daily Multi  Tabs (Multiple Vitamins-Minerals) .Marland Kitchen.. 1 By Mouth Once Daily 9)  Fish Oil 1000 Mg Caps (Omega-3 Fatty Acids) .Marland Kitchen.. 1 By Mouth Once Daily 10)  Clonidine Hcl 0.1 Mg Tabs (Clonidine Hcl) .Marland Kitchen.. 1 By Mouth Two Times A Day 11)  Sertraline Hcl 100 Mg Tabs (Sertraline Hcl) .... 2 By Mouth Once Daily 12)  Meclizine Hcl 12.5 Mg Tabs (Meclizine Hcl) .Marland Kitchen.. 1 By Mouth Q 6 Hrs As Needed 13)  Omeprazole 20 Mg  Cpdr (Omeprazole) .... 2 By Mouth Once Daily 14)  B-100 Complex  Tabs (Vitamins-Lipotropics) .... Once Daily 15)  Vitamin D 400 Unit Tabs (Cholecalciferol) .... Once Daily 16)  Humira Pen 40 Mg/0.29ml Kit (Adalimumab) 17)  Prednisone 10 Mg Tabs (Prednisone) .... 3po Qd For 3days, Then 2po Qd For 3days, Then 1po Qd For 3days, Then Stop 18)  Hydrocodone-Acetaminophen 7.5-325 Mg Tabs (Hydrocodone-Acetaminophen) .Marland Kitchen.. 1 By Mouth Q 6 Hrs As Needed Pain  Allergies (verified): 1)  ! * Labetotal 2)  ! Ceftin  Past History:  Social History:  Last updated: 01/10/2009 Former Smoker Alcohol use-no Married (second) 2 children work - Barrister's clerk for damaged cars  note:  pt work cell phone :    [phone]  Risk Factors: Smoking Status: quit (12/20/2006)  Past Medical History: Reviewed history from 11/18/2007 and no changes required. 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 brain 12/2004, and twice aug and sept 2009 Cerebrovascular disease - 60% right MCA stenosis DIABETES MELLITUS, TYPE II (ICD-250.00) OVARIAN CYST (ICD-620.2) ALLERGIC RHINITIS (ICD-477.9) HYPERLIPIDEMIA (ICD-272.4) FAMILY HISTORY DIABETES 1ST DEGREE RELATIVE (ICD-V18.0) RENAL INSUFFICIENCY (ICD-588.9) CIRRHOSIS (ICD-571.5) CHOLERA, 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 Hysterectomy 01/08 EGD (06/06/05) Stress Cardiolite (04/17/01) Echocardiogram  (02/02/02) Cholecystectomy Neck surgery  Review of Systems       all otherwise negative per pt -    Physical Exam  General:  alert and overweight-appearing.   Head:  normocephalic and atraumatic.   Eyes:  vision grossly intact, pupils equal, and pupils round.   Ears:  R ear normal and L ear normal.   Nose:  no external deformity and no nasal discharge.   Mouth:  no gingival abnormalities and pharynx pink and moist.   Neck:  supple and no masses.   Lungs:  normal respiratory effort and normal breath sounds.   Heart:  normal rate and regular rhythm.   Abdomen:  soft, non-tender, and normal bowel sounds.   Msk:  tender spine in the midline approx t8 to the the upper lumbar area without significant bilat paravertebral swelling or tenderness Extremities:  no edema, no erythema  Neurologic:  strength normal in all extremities, sensation intact to light touch, gait normal, and DTRs symmetrical and normal.   Psych:  not depressed appearing and moderately anxious.     Impression & Recommendations:  Problem # 1:  BACK PAIN (ICD-724.5)  Her updated medication list for this problem includes:    Ecotrin 325 Mg Tbec (Aspirin) .Marland Kitchen... 1 by mouth once daily    Flexeril 5 Mg Tabs (Cyclobenzaprine hcl) .Marland Kitchen... 1 by mouth three times a day as needed    Hydrocodone-acetaminophen 7.5-325 Mg Tabs (Hydrocodone-acetaminophen) .Marland Kitchen... 1 by mouth q 6 hrs as needed pain  Orders: T-Thoracic Spine 2 Views [phone]) T-Lumbar Spine Complete, 5 Views [phone])  unclear etiology, suspect underlying DJD or DDD, exam o/w benign;  treat as above, f/u any worsening signs or symptoms , check films  Problem # 2:  HYPERTENSION (ICD-401.9)  Her updated medication list for this problem includes:    Furosemide 40 Mg Tabs (Furosemide) .Marland Kitchen... Take 1  tablet by mouth once daily as needed swelling    Norvasc 10 Mg Tabs (Amlodipine besylate) .Marland Kitchen... 1 by mouth once daily    Clonidine Hcl 0.1 Mg Tabs (Clonidine hcl) .Marland Kitchen... 1 by  mouth two times a day  BP today: 122/80 Prior BP: 140/90 (07/06/2009)  Labs Reviewed: K+: 3.9 (07/06/2009) Creat: : 1.2 (07/06/2009)   Chol: 229 (01/10/2009)   HDL: 46.50 (01/10/2009)   LDL: 114 (07/07/2008)   TG: 181.0 (01/10/2009) stable overall by hx and exam, ok to continue meds/tx as is   Problem # 3:  DIABETES MELLITUS, TYPE II (ICD-250.00)  Her updated medication  list for this problem includes:    Ecotrin 325 Mg Tbec (Aspirin) .Marland Kitchen... 1 by mouth once daily  Labs Reviewed: Creat: 1.2 (07/06/2009)    Reviewed HgBA1c results: 6.1 (03/21/2009)  5.9 (01/10/2009) stable overall by hx and exam, ok to continue meds/tx as is  - no OHA needed, Pt to cont DM diet, excercise, wt loss efforts  Complete Medication List: 1)  Furosemide 40 Mg Tabs (Furosemide) .... Take 1  tablet by mouth once daily as needed swelling 2)  Norvasc 10 Mg Tabs (Amlodipine besylate) .Marland Kitchen.. 1 by mouth once daily 3)  Levothroid 112 Mcg Tabs (Levothyroxine sodium) .Marland Kitchen.. 1 by mouth once daily 4)  Ecotrin 325 Mg Tbec (Aspirin) .Marland Kitchen.. 1 by mouth once daily 5)  Simvastatin 80 Mg Tabs (Simvastatin) .Marland Kitchen.. 1 by mouth 3 x per wk 6)  Nystatin 100000 Unit/gm Powd (Nystatin) .... Use asd to affected area two times a day as needed 7)  Flexeril 5 Mg Tabs (Cyclobenzaprine hcl) .Marland Kitchen.. 1 by mouth three times a day as needed 8)  Daily Multi Tabs (Multiple vitamins-minerals) .Marland Kitchen.. 1 by mouth once daily 9)  Fish Oil 1000 Mg Caps (Omega-3 fatty acids) .Marland Kitchen.. 1 by mouth once daily 10)  Clonidine Hcl 0.1 Mg Tabs (Clonidine hcl) .Marland Kitchen.. 1 by mouth two times a day 11)  Sertraline Hcl 100 Mg Tabs (Sertraline hcl) .... 2 by mouth once daily 12)  Meclizine Hcl 12.5 Mg Tabs (Meclizine hcl) .Marland Kitchen.. 1 by mouth q 6 hrs as needed 13)  Omeprazole 20 Mg Cpdr (Omeprazole) .... 2 by mouth once daily 14)  B-100 Complex Tabs (Vitamins-lipotropics) .... Once daily 15)  Vitamin D 400 Unit Tabs (Cholecalciferol) .... Once daily 16)  Humira Pen 40 Mg/0.66ml Kit  (Adalimumab) 17)  Prednisone 10 Mg Tabs (Prednisone) .... 3po qd for 3days, then 2po qd for 3days, then 1po qd for 3days, then stop 18)  Hydrocodone-acetaminophen 7.5-325 Mg Tabs (Hydrocodone-acetaminophen) .Marland Kitchen.. 1 by mouth q 6 hrs as needed pain  Patient Instructions: 1)  Please take all new medications as prescribed 2)  Continue all previous medications as before this visit  3)  Please go to Radiology in the basement level for your X-Ray today  4)  Please schedule a follow-up appointment in 6 months with CPX labs Prescriptions: HYDROCODONE-ACETAMINOPHEN 7.5-325 MG TABS (HYDROCODONE-ACETAMINOPHEN) 1 by mouth q 6 hrs as needed pain  #50 x 1   Entered and Authorized by:   Biagio Borg MD   Signed by:   Biagio Borg MD on 08/18/2009   Method used:   Print then Give to Patient   RxID:   [id number] FLEXERIL 5 MG TABS (CYCLOBENZAPRINE HCL) 1 by mouth three times a day as needed  #60 x 1   Entered and Authorized by:   Biagio Borg MD   Signed by:   Biagio Borg MD on 08/18/2009   Method used:   Print then Give to Patient   [id number] PREDNISONE 10 MG TABS (PREDNISONE) 3po qd for 3days, then 2po qd for 3days, then 1po qd for 3days, then stop  #18 x 0   Entered and Authorized by:   Biagio Borg MD   Signed by:   Biagio Borg MD on 08/18/2009   Method used:   Print then Give to Patient   RxID:   [id number]

## 2010-03-23 NOTE — Letter (Signed)
Summary: [place]  [place]   Imported By: Bubba Hales 05/23/2009 10:44:48  _____________________________________________________________________  External Attachment:    Type:   Image     Comment:   External Document

## 2010-03-23 NOTE — Progress Notes (Signed)
Summary: Ucx?  Phone Note Call from Patient   Caller: Patient [phone] Summary of Call: pt called stating that she has been having urgency, frequency and  burning when urinating x 3 days. Pt thinks she may have a UTI or blabber infection. Pt is requesting to bring a specimen to lab for Cx. Please advise? Initial call taken by: Crissie Sickles, [place],  August 09, 2009 11:27 AM  Follow-up for Phone Call        OK for ua and culture - 788.60 - to robin to handle Follow-up by: Biagio Borg MD,  August 09, 2009 1:07 PM  Additional Follow-up for Phone Call Additional follow up Details #1::        called pt left msg to call back. Put pt. on lab schedule August 10, 2009. Additional Follow-up by: Sharon Seller,  August 09, 2009 1:34 PM    Additional Follow-up for Phone Call Additional follow up Details #2::    Patient returned call informed to ok to come to lab and have UA and Culture done August 10, 2009 Follow-up by: Shirlean Mylar Ewing CMA Deborra Medina),  August 09, 2009 2:34 PM

## 2010-03-23 NOTE — Progress Notes (Signed)
Summary: pt ?  Phone Note Call from Patient Call back at [phone]   Caller: Patient Summary of Call: patient called requesting advice on bariatric clinic vs. Weight [facility], and which one she should do. Initial call taken by: Felipa Evener,  April 25, 2009 10:24 AM  Follow-up for Phone Call        both will work, wt watcher more "natural" and less expensive so I would try that first Follow-up by: Biagio Borg MD,  April 25, 2009 1:17 PM  Additional Follow-up for Phone Call Additional follow up Details #1::        patient notified Additional Follow-up by: Felipa Evener,  April 25, 2009 2:12 PM

## 2010-03-23 NOTE — Assessment & Plan Note (Signed)
Summary: CPX /NWS  #   Vital Signs:  Patient profile:   55 year old female Height:      63 inches Weight:      217.25 pounds BMI:     38.62 O2 Sat:      97 % on Room air Temp:     98.2 degrees F oral Pulse rate:   72 / minute BP sitting:   120 / 82  (left arm) Cuff size:   regular  Vitals Entered By: Shirlean Mylar Ewing CMA Deborra Medina) (August 30, 2009 8:33 AM)  O2 Flow:  Room air  Preventive Care Screening     declines pneumovax  CC: Adult Physical/RE   Primary Care Provider:  [name], MD  CC:  Adult Physical/RE.  History of Present Illness: overall doing well,  no specfici complaints, Pt denies CP, sob, doe, wheezing, orthopnea, pnd, worsening LE edema, palps, dizziness or syncope  Pt denies new neuro symptoms such as headache, facial or extremity weakness   Pt denies polydipsia, polyuria, or low sugar symptoms such as shakiness improved with eating.  Overall good compliance with meds, trying to follow low chol, DM diet, wt stable, little excercise however  No fever, wt loss, night sweats, loss of appetite or other constitutional symptoms   Problems Prior to Update: 1)  Uti  (ICD-599.0) 2)  Fatigue  (ICD-780.79) 3)  Leg Cramps  (ICD-729.82) 4)  Leg Pain, Bilateral  (ICD-729.5) 5)  Back Pain  (ICD-724.5) 6)  Diarrhea  (ICD-787.91) 7)  Esophageal Reflux  (ICD-530.81) 8)  Hematochezia  (ICD-578.1) 9)  Abdominal Pain, Epigastric  (ICD-789.06) 10)  Polyuria  (ICD-788.42) 11)  Sinusitis- Acute-nos  (ICD-461.9) 12)  Ganglion Cyst, Tendon Sheath  (ICD-727.42) 13)  Headache  (ICD-784.0) 14)  Contusion, Lower Leg  (ICD-924.10) 15)  Contact Dermatitis  (ICD-692.9) 16)  Abdominal Pain, Generalized  (ICD-789.07) 17)  Preventive Health Care  (ICD-V70.0) 18)  Cellulitis and Abscess of Other Specified Site  (ICD-682.8) 19)  Back Pain  (ICD-724.5) 20)  Rash-nonvesicular  (ICD-782.1) 21)  Bradycardia  (ICD-427.89) 22)  Weakness  (ICD-780.79) 23)  Cellulitis, Trunk  (ICD-682.2) 24)   Encounter For Long-term Use of Other Medications  (ICD-V58.69) 25)  Skin Rash  (ICD-782.1) 26)  Transient Ischemic Attack  (ICD-435.9) 27)  Carpal Tunnel Syndrome  (ICD-354.0) 28)  Vertigo  (ICD-780.4) 29)  Otitis Media, Serous, Right  (ICD-381.4) 30)  Esophagitis  (ICD-530.10) 31)  Dysuria  (ICD-788.1) 32)  Vitamin B12 Deficiency  (ICD-266.2) 33)  Fatigue  (ICD-780.79) 34)  Abdominal Pain, Epigastric  (ICD-789.06) 35)  Shingles, Hx of  (ICD-V13.8) 36)  Common Migraine  (ICD-346.10) 37)  Gerd  (ICD-530.81) 38)  Transient Ischemic Attacks, Hx of  (ICD-V12.50) 39)  Diabetes Mellitus, Type II  (ICD-250.00) 40)  Ovarian Cyst  (ICD-620.2) 41)  Allergic Rhinitis  (ICD-477.9) 42)  Hyperlipidemia  (ICD-272.4) 43)  Family History Diabetes 1st Degree Relative  (ICD-V18.0) 64)  Renal Insufficiency  (ICD-588.9) 45)  Cirrhosis  (ICD-571.5) 46)  Cholera, 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 Take 1  Tablet By Mouth Once Daily As Needed Swelling 2)  Norvasc 10 Mg  Tabs (Amlodipine Besylate) .Marland Kitchen.. 1 By Mouth Once  Daily 3)  Levothroid 112 Mcg Tabs (Levothyroxine Sodium) .Marland Kitchen.. 1 By Mouth Once Daily 4)  Ecotrin 325 Mg Tbec (Aspirin) .Marland Kitchen.. 1 By Mouth Once Daily 5)  Simvastatin 80 Mg Tabs (Simvastatin) .Marland Kitchen.. 1 By Mouth 3 X Per Wk 6)  Nystatin 100000 Unit/gm Powd (Nystatin) .... Use Asd To Affected Area Two Times A Day As Needed 7)  Flexeril 5 Mg Tabs (Cyclobenzaprine Hcl) .Marland Kitchen.. 1 By Mouth Three Times A Day As Needed 8)  Daily Multi  Tabs (Multiple Vitamins-Minerals) .Marland Kitchen.. 1 By Mouth Once Daily 9)  Fish Oil 1000 Mg Caps (Omega-3 Fatty Acids) .Marland Kitchen.. 1 By Mouth Once Daily 10)  Clonidine Hcl 0.1  Mg Tabs (Clonidine Hcl) .Marland Kitchen.. 1 By Mouth Two Times A Day 11)  Sertraline Hcl 100 Mg Tabs (Sertraline Hcl) .... 2 By Mouth Once Daily 12)  Meclizine Hcl 12.5 Mg Tabs (Meclizine Hcl) .Marland Kitchen.. 1 By Mouth Q 6 Hrs As Needed 13)  Omeprazole 20 Mg Cpdr (Omeprazole) .... 2 By Mouth Once Daily 14)  B-100 Complex  Tabs (Vitamins-Lipotropics) .... Once Daily 15)  Vitamin D 400 Unit Tabs (Cholecalciferol) .... Once Daily 16)  Humira Pen 40 Mg/0.29ml Kit (Adalimumab) 17)  Prednisone 10 Mg Tabs (Prednisone) .... 3po Qd For 3days, Then 2po Qd For 3days, Then 1po Qd For 3days, Then Stop 18)  Hydrocodone-Acetaminophen 7.5-325 Mg Tabs (Hydrocodone-Acetaminophen) .Marland Kitchen.. 1 By Mouth Q 6 Hrs As Needed Pain  Current Medications (verified): 1)  Furosemide 40 Mg Tabs (Furosemide) .... Take 1  Tablet By Mouth Once Daily As Needed Swelling 2)  Norvasc 10 Mg  Tabs (Amlodipine Besylate) .Marland Kitchen.. 1 By Mouth Once Daily 3)  Levothroid 112 Mcg Tabs (Levothyroxine Sodium) .Marland Kitchen.. 1 By Mouth Once Daily 4)  Ecotrin 325 Mg Tbec (Aspirin) .Marland Kitchen.. 1 By Mouth Once Daily 5)  Simvastatin 80 Mg Tabs (Simvastatin) .Marland Kitchen.. 1 By Mouth 3 X Per Wk 6)  Nystatin 100000 Unit/gm Powd (Nystatin) .... Use Asd To Affected Area Two Times A Day As Needed 7)  Flexeril 5 Mg Tabs (Cyclobenzaprine Hcl) .Marland Kitchen.. 1 By Mouth Three Times A Day As Needed 8)  Daily Multi  Tabs (Multiple Vitamins-Minerals) .Marland Kitchen.. 1 By Mouth Once Daily 9)  Fish Oil 1000 Mg Caps (Omega-3 Fatty Acids) .Marland Kitchen.. 1 By Mouth Once Daily 10)  Clonidine Hcl 0.1 Mg Tabs (Clonidine Hcl) .Marland Kitchen.. 1 By Mouth Two Times A Day 11)  Sertraline Hcl 100 Mg Tabs (Sertraline Hcl) .... 2 By Mouth Once Daily 12)  Meclizine Hcl 12.5 Mg Tabs (Meclizine Hcl) .Marland Kitchen.. 1 By Mouth Q 6 Hrs As Needed 13)  Omeprazole 20 Mg Cpdr (Omeprazole) .... 2 By Mouth Once Daily 14)  B-100 Complex  Tabs (Vitamins-Lipotropics) .... Once Daily 15)  Vitamin D 400 Unit Tabs (Cholecalciferol) .... Once Daily 16)  Humira Pen 40 Mg/0.68ml Kit (Adalimumab) 17)   Prednisone 10 Mg Tabs (Prednisone) .... 3po Qd For 3days, Then 2po Qd For 3days, Then 1po Qd For 3days, Then Stop 18)  Hydrocodone-Acetaminophen 7.5-325 Mg Tabs (Hydrocodone-Acetaminophen) .Marland Kitchen.. 1 By Mouth Q 6 Hrs As Needed Pain  Allergies (verified): 1)  ! * Labetotal 2)  ! 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 12/2004, and twice aug and sept 2009 Cerebrovascular disease - 60% right MCA stenosis DIABETES MELLITUS,  TYPE II (ICD-250.00) OVARIAN CYST (ICD-620.2) ALLERGIC RHINITIS (ICD-477.9) HYPERLIPIDEMIA (ICD-272.4) FAMILY HISTORY DIABETES 1ST DEGREE RELATIVE (ICD-V18.0) RENAL INSUFFICIENCY (ICD-588.9) CIRRHOSIS (ICD-571.5) CHOLERA, 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) DEPRESSION (ICD-311) ANXIETY (ICD-300.00) CARPAL TUNNEL SYNDROME, BILATERAL (ICD-354.0) MORBID OBESITY (ICD-278.01) MIGRAINE HEADACHE (ICD-346.90) HYPOTHYROIDISM (ICD-244.9) HYPERTENSION (ICD-401.9)  Past Surgical History: Last updated: 04/12/2009 Hysterectomy 01/08 EGD (06/06/05) Stress Cardiolite (04/17/01) Echocardiogram (02/02/02) Cholecystectomy Neck surgery  Family History: Last updated: 11/18/2007 Family History Diabetes 1st degree relative Family History Hypertension father with colon polyps, and TIA x 4  Social History: Last updated: 01/10/2009 Former Smoker Alcohol use-no Married (second) 2 children work - Barrister's clerk for damaged cars  note:  pt work cell phone :    [phone]  Risk Factors: Smoking Status: quit (12/20/2006)  Review of Systems  The patient denies anorexia, fever, weight loss, weight gain, vision loss,  decreased hearing, hoarseness, chest pain, syncope, dyspnea on exertion, peripheral edema, prolonged cough, headaches, hemoptysis, abdominal pain, melena, hematochezia, severe indigestion/heartburn, hematuria, muscle weakness, suspicious skin lesions, transient blindness, difficulty walking, depression, unusual weight change, abnormal bleeding, enlarged lymph nodes, and angioedema.         all otherwise negative per pt -    Physical Exam  General:  alert and overweight-appearing.   Head:  normocephalic and atraumatic.   Eyes:  vision grossly intact, pupils equal, and pupils round.   Ears:  R ear normal and L ear normal.   Nose:  no external deformity and no nasal discharge.   Mouth:  no gingival abnormalities and pharynx pink and moist.   Neck:  supple and no masses.   Lungs:  normal respiratory effort and normal breath sounds.   Heart:  normal rate and regular rhythm.   Abdomen:  soft, non-tender, and normal bowel sounds.   Msk:  no joint tenderness and no joint swelling.   Extremities:  no edema, no erythema  Neurologic:  cranial nerves II-XII intact and strength normal in all extremities.   Skin:  color normal and no rashes.   Psych:  not depressed appearing and slightly anxious.     Impression & Recommendations:  Problem # 1:  Preventive Health Care (ICD-V70.0)  Overall doing well, age appropriate education and counseling updated and referral for appropriate preventive services done unless declined, immunizations up to date or declined, diet counseling done if overweight, urged to quit smoking if smokes , most recent labs reviewed and current ordered if appropriate, ecg reviewed or declined (interpretation per ECG scanned in the EMR if done); information regarding Medicare Prevention requirements given if appropriate; speciality referrals updated as appropriate   Orders: EKG w/ Interpretation (93000)  Problem # 2:  HYPOTHYROIDISM (ICD-244.9)  Her updated medication list for this  problem includes:    Levothroid 112 Mcg Tabs (Levothyroxine sodium) .Marland Kitchen... 1 by mouth once daily previous recent testing with lab error that day; for repeat TSH today -   Orders: TLB-TSH (Thyroid Stimulating Hormone) (84443-TSH)  Complete Medication List: 1)  Furosemide 40 Mg Tabs (Furosemide) .... Take 1  tablet by mouth once daily as needed swelling 2)  Norvasc 10 Mg Tabs (Amlodipine besylate) .Marland Kitchen.. 1 by mouth once daily 3)  Levothroid 112 Mcg Tabs (Levothyroxine sodium) .Marland Kitchen.. 1 by mouth once daily 4)  Ecotrin 325 Mg Tbec (Aspirin) .Marland Kitchen.. 1 by mouth once daily 5)  Simvastatin 80 Mg Tabs (Simvastatin) .Marland Kitchen.. 1 by mouth 3 x per wk 6)  Nystatin 100000 Unit/gm Powd (Nystatin) .... Use asd to affected area two times a day  as needed 7)  Flexeril 5 Mg Tabs (Cyclobenzaprine hcl) .Marland Kitchen.. 1 by mouth three times a day as needed 8)  Daily Multi Tabs (Multiple vitamins-minerals) .Marland Kitchen.. 1 by mouth once daily 9)  Fish Oil 1000 Mg Caps (Omega-3 fatty acids) .Marland Kitchen.. 1 by mouth once daily 10)  Clonidine Hcl 0.1 Mg Tabs (Clonidine hcl) .Marland Kitchen.. 1 by mouth two times a day 11)  Sertraline Hcl 100 Mg Tabs (Sertraline hcl) .... 2 by mouth once daily 12)  Meclizine Hcl 12.5 Mg Tabs (Meclizine hcl) .Marland Kitchen.. 1 by mouth q 6 hrs as needed 13)  Omeprazole 20 Mg Cpdr (Omeprazole) .... 2 by mouth once daily 14)  B-100 Complex Tabs (Vitamins-lipotropics) .... Once daily 15)  Vitamin D 400 Unit Tabs (Cholecalciferol) .... Once daily 16)  Humira Pen 40 Mg/0.52ml Kit (Adalimumab) 17)  Prednisone 10 Mg Tabs (Prednisone) .... 3po qd for 3days, then 2po qd for 3days, then 1po qd for 3days, then stop 18)  Hydrocodone-acetaminophen 7.5-325 Mg Tabs (Hydrocodone-acetaminophen) .Marland Kitchen.. 1 by mouth q 6 hrs as needed pain  Other Orders: Tdap => 65yrs IM [id number]) Admin 1st Vaccine [phone])  Patient Instructions: 1)  you had the tetanus shot today 2)  please repeat the thyroid test today 3)  Your EKG was good today 4)  Continue all previous medications as  before this visit  5)  please call for your yearly mammogram 6)  Please schedule a follow-up appointment in 6 months with: 7)  BMP prior to visit, ICD-9: 250.02 8)  Lipid Panel prior to visit, ICD-9: 9)  HbgA1C prior to visit, ICD-9: Prescriptions: LEVOTHROID 112 MCG TABS (LEVOTHYROXINE SODIUM) 1 by mouth once daily  #90 x 3   Entered and Authorized by:   Biagio Borg MD   Signed by:   Biagio Borg MD on 08/30/2009   Method used:   Print then Give to Patient   RxID:   [id number] [place] 10 MG  TABS (AMLODIPINE BESYLATE) 1 by mouth once daily  #90 x 3   Entered and Authorized by:   Biagio Borg MD   Signed by:   Biagio Borg MD on 08/30/2009   Method used:   Print then Give to Patient   RxIDIZ:[id number]    Immunizations Administered:  Tetanus Vaccine:    Vaccine Type: Tdap    Site: left deltoid    Mfr: GlaxoSmithKline    Dose: 0.5 ml    Route: IM    Given by: Shirlean Mylar Ewing CMA (AAMA)    Exp. Date: 05/13/2011    Lot #: [id number]    VIS given: 01/07/07 version given August 30, 2009.

## 2010-03-23 NOTE — Assessment & Plan Note (Signed)
Summary: leg cramps/cd   Vital Signs:  Patient profile:   55 year old female Height:      62 inches Weight:      219.25 pounds BMI:     40.25 O2 Sat:      97 % on Room air Temp:     97 degrees F oral Pulse rate:   67 / minute BP sitting:   122 / 82  (left arm) Cuff size:   large  Vitals Entered ByShirlean Mylar Ewing (Jun 20, 2009 2:16 PM)  O2 Flow:  Room air CC: Leg Cramps for 1 week/RE   Primary Care Provider:  [name], MD  CC:  Leg Cramps for 1 week/RE.  History of Present Illness: here with 3 days watery diarrhea last wk with fatigue , malaise but no abd pain, n/v, fever , n/v or blood; now improved last 2 days, was feeling not and cold (had to lie on the couch with blanket one day); but now no chills of fever, no rash or joint pain, no ST, and  Pt denies CP, sob, doe, wheezing, orthopnea, pnd, worsening LE edema, palps,or syncope , but did have a slight non prod cough. No blood.  Did have nausea, no vomiting - has antiemetic at home which worked well   Overall with marked fatigue and weakness , general malaise at the time now improved.  No  one sick at home or work.  no family contacts ill.   Also with c/o bilat LE legs aching to the knees bilat, , as well as lower back aching, for 10 day, moderate, again without GI or GU symtpoms except for the above.  trying to keep up with fluids.  Had mild drunk feeling today and mild dizzy but no frank syncope.  She's not sure if she should take the humira  - took first dose 2 wks ago, due for shot q 2 wks, due tonight, and dermatologist - Dr Wilhemina Bonito not available today.  Has appt tomorrow.  Requests labs today to r/o significant metabolic problem.  also with cramp to the right calf without sweling, redness or tenderness , sob, CP or palpitations, occurs at night only for the last week.  Has not fallen and gait no change.  She is extremely worried about humira side effect causing the diarrhea and/or the back and leg aches.    Problems Prior to  Update: 1)  Fatigue  (ICD-780.79) 2)  Leg Cramps  (ICD-729.82) 3)  Leg Pain, Bilateral  (ICD-729.5) 4)  Back Pain  (ICD-724.5) 5)  Diarrhea  (ICD-787.91) 6)  Esophageal Reflux  (ICD-530.81) 7)  Hematochezia  (ICD-578.1) 8)  Abdominal Pain, Epigastric  (ICD-789.06) 9)  Polyuria  (ICD-788.42) 10)  Sinusitis- Acute-nos  (ICD-461.9) 11)  Ganglion Cyst, Tendon Sheath  (ICD-727.42) 12)  Headache  (ICD-784.0) 13)  Contusion, Lower Leg  (ICD-924.10) 14)  Contact Dermatitis  (ICD-692.9) 15)  Abdominal Pain, Generalized  (ICD-789.07) 16)  Preventive Health Care  (ICD-V70.0) 17)  Cellulitis and Abscess of Other Specified Site  (ICD-682.8) 18)  Back Pain  (ICD-724.5) 19)  Rash-nonvesicular  (ICD-782.1) 20)  Bradycardia  (ICD-427.89) 21)  Weakness  (ICD-780.79) 22)  Cellulitis, Trunk  (ICD-682.2) 23)  Encounter For Long-term Use of Other Medications  (ICD-V58.69) 24)  Skin Rash  (ICD-782.1) 25)  Transient Ischemic Attack  (ICD-435.9) 26)  Carpal Tunnel Syndrome  (ICD-354.0) 27)  Vertigo  (ICD-780.4) 28)  Otitis Media, Serous, Right  (ICD-381.4) 29)  Esophagitis  (ICD-530.10) 30)  Dysuria  (ICD-788.1) 31)  Vitamin B12 Deficiency  (ICD-266.2) 32)  Fatigue  (ICD-780.79) 33)  Abdominal Pain, Epigastric  (ICD-789.06) 34)  Shingles, Hx of  (ICD-V13.8) 35)  Common Migraine  (ICD-346.10) 36)  Gerd  (ICD-530.81) 37)  Transient Ischemic Attacks, Hx of  (ICD-V12.50) 38)  Diabetes Mellitus, Type II  (ICD-250.00) 39)  Ovarian Cyst  (ICD-620.2) 40)  Allergic Rhinitis  (ICD-477.9) 41)  Hyperlipidemia  (ICD-272.4) 42)  Family History Diabetes 1st Degree Relative  (ICD-V18.0) 68)  Renal Insufficiency  (ICD-588.9) 44)  Cirrhosis  (ICD-571.5) 45)  Cholera, 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 Take 1  Tablet By Mouth Once Daily As Needed Swelling 2)  Norvasc 10 Mg  Tabs (Amlodipine Besylate) .Marland Kitchen.. 1 By Mouth Once Daily 3)  Levothroid 112 Mcg Tabs (Levothyroxine Sodium) .Marland Kitchen.. 1 By Mouth Once Daily 4)  Ecotrin 325 Mg Tbec (Aspirin) .Marland Kitchen.. 1 By Mouth Once Daily 5)  Simvastatin 80 Mg Tabs (Simvastatin) .Marland Kitchen.. 1 By Mouth 3 X Per Wk 6)  Nystatin 100000 Unit/gm Powd (Nystatin) .... Use Asd To Affected Area Two Times A Day As Needed 7)  Flexeril 5 Mg Tabs (Cyclobenzaprine Hcl) .Marland Kitchen.. 1 By Mouth Three Times A Day As Needed 8)  Daily Multi  Tabs (Multiple Vitamins-Minerals) .Marland Kitchen.. 1 By Mouth Once Daily 9)  Fish Oil 1000 Mg Caps (Omega-3 Fatty Acids) .Marland Kitchen.. 1 By Mouth Once Daily 10)  Clonidine Hcl 0.1 Mg Tabs (Clonidine Hcl) .Marland Kitchen.. 1 By Mouth Two Times A Day 11)  Sertraline Hcl 100 Mg Tabs (Sertraline Hcl) .... 2 By Mouth Once Daily 12)  Hydrocodone-Homatropine 5-1.5 Mg/58ml Syrp (Hydrocodone-Homatropine) .Marland Kitchen.. 1 Tsp By Mouth Q 6 Hrs As Needed Cough 13)  Meclizine Hcl 12.5 Mg Tabs (Meclizine Hcl) .Marland Kitchen.. 1 By Mouth Q 6 Hrs As Needed 14)  Promethazine Hcl 25 Mg Tabs (Promethazine Hcl) .Marland Kitchen.. 1 By Mouth Q 6 Hrs As Needed Nausea 15)  Omeprazole 20 Mg Cpdr (Omeprazole) .... 2 By Mouth Once Daily 16)  B-100 Complex  Tabs (Vitamins-Lipotropics) .... Once Daily 17)  Vitamin D 400 Unit Tabs (Cholecalciferol) .... Once Daily  Current Medications (verified): 1)  Furosemide 40 Mg Tabs (Furosemide) .... Take 1  Tablet By Mouth Once Daily As Needed Swelling 2)  Norvasc 10 Mg  Tabs (Amlodipine Besylate) .Marland Kitchen.. 1 By Mouth Once Daily 3)  Levothroid 112 Mcg Tabs (Levothyroxine Sodium) .Marland Kitchen.. 1 By Mouth Once Daily 4)  Ecotrin 325 Mg Tbec (Aspirin) .Marland Kitchen.. 1 By Mouth Once Daily 5)  Simvastatin 80 Mg Tabs (Simvastatin) .Marland Kitchen.. 1 By Mouth 3 X Per Wk 6)  Nystatin 100000 Unit/gm Powd (Nystatin) .... Use Asd To Affected Area  Two Times A Day As Needed 7)  Flexeril 5 Mg Tabs (Cyclobenzaprine Hcl) .Marland Kitchen.. 1 By Mouth Three Times A Day As Needed 8)  Daily Multi  Tabs (Multiple Vitamins-Minerals) .Marland Kitchen.. 1 By Mouth Once Daily 9)  Fish Oil 1000 Mg Caps (Omega-3 Fatty Acids) .Marland Kitchen.. 1 By Mouth Once Daily 10)  Clonidine Hcl 0.1 Mg Tabs (Clonidine Hcl) .Marland Kitchen.. 1 By Mouth Two Times A Day 11)  Sertraline Hcl 100 Mg Tabs (Sertraline Hcl) .... 2 By Mouth Once Daily 12)  Hydrocodone-Homatropine 5-1.5 Mg/61ml  Syrp (Hydrocodone-Homatropine) .Marland Kitchen.. 1 Tsp By Mouth Q 6 Hrs As Needed Cough 13)  Meclizine Hcl 12.5 Mg Tabs (Meclizine Hcl) .Marland Kitchen.. 1 By Mouth Q 6 Hrs As Needed 14)  Promethazine Hcl 25 Mg Tabs (Promethazine Hcl) .Marland Kitchen.. 1 By Mouth Q 6 Hrs As Needed Nausea 15)  Omeprazole 20 Mg Cpdr (Omeprazole) .... 2 By Mouth Once Daily 16)  B-100 Complex  Tabs (Vitamins-Lipotropics) .... Once Daily 17)  Vitamin D 400 Unit Tabs (Cholecalciferol) .... Once Daily 18)  Humira Pen 40 Mg/0.80ml Kit (Adalimumab)  Allergies (verified): 1)  ! * Labetotal 2)  ! 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 12/2004, and twice aug and sept 2009 Cerebrovascular disease - 60% right MCA stenosis DIABETES MELLITUS, TYPE II (ICD-250.00) OVARIAN CYST (ICD-620.2) ALLERGIC RHINITIS (ICD-477.9) HYPERLIPIDEMIA (ICD-272.4) FAMILY HISTORY DIABETES 1ST DEGREE RELATIVE (ICD-V18.0) RENAL INSUFFICIENCY (ICD-588.9) CIRRHOSIS (ICD-571.5) CHOLERA, 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) DEPRESSION (ICD-311) ANXIETY (ICD-300.00) CARPAL TUNNEL SYNDROME, BILATERAL (ICD-354.0) MORBID OBESITY (ICD-278.01) MIGRAINE HEADACHE  (ICD-346.90) HYPOTHYROIDISM (ICD-244.9) HYPERTENSION (ICD-401.9)  Past Surgical History: Last updated: 04/12/2009 Hysterectomy 01/08 EGD (06/06/05) Stress Cardiolite (04/17/01) Echocardiogram (02/02/02) Cholecystectomy Neck surgery  Social History: Last updated: 01/10/2009 Former Smoker Alcohol use-no Married (second) 2 children work - Barrister's clerk for damaged cars  note:  pt work cell phone :    [phone]  Risk Factors: Smoking Status: quit (12/20/2006)  Review of Systems       all otherwise negative per pt -    Physical Exam  General:  alert and overweight-appearing.  , anxious but nontoxic Head:  normocephalic and atraumatic.   Eyes:  vision grossly intact, pupils equal, and pupils round.   Ears:  R ear normal and L ear normal.   Nose:  no external deformity and no nasal discharge.   Mouth:  no gingival abnormalities and pharynx pink and moist.   Neck:  supple and no masses.   Lungs:  normal respiratory effort and normal breath sounds.   Heart:  normal rate and regular rhythm.   Abdomen:  soft, non-tender, and normal bowel sounds.   Msk:  no joint tenderness and no joint swelling.  , no spine tender throughout, has mild left > right bilat paravertebral tender, no thigh muscular tender Extremities:  no edema, no erythema  Neurologic:  cranial nerves II-XII intact, strength normal in all extremities, and gait normal.   Skin:  color normal, no rashes, and no suspicious lesions., Psych:  moderately anxious.     Impression & Recommendations:  Problem # 1:  DIARRHEA (ICD-787.91)  with marked fatigue, overall oimproving and does not feel orthostatic;  to drink more fluids for a few days, immodium as needed, and check labs - c/w acute gastroenteritis - prob viral, ok to follow  Orders: TLB-BMP (Basic Metabolic Panel-BMET) (99991111) TLB-CBC Platelet - w/Differential (85025-CBCD) TLB-Hepatic/Liver Function Pnl (80076-HEPATIC) TLB-Lipase (83690-LIPASE)  Problem #  2:  BACK PAIN (ICD-724.5)  Her updated medication list for this problem includes:    Ecotrin 325 Mg Tbec (Aspirin) .Marland Kitchen... 1 by mouth once daily    Flexeril 5 Mg Tabs (Cyclobenzaprine hcl) .Marland Kitchen... 1 by mouth three times a day as needed aching type - for urine studies  Orders: TLB-Udip w/ Micro (81001-URINE) T-Culture, Urine [id number])  Problem # 3:  LEG PAIN, BILATERAL (ICD-729.5)  possibly related to lower back , but she is thinking maybe related to the humira ;  ok to hold the humira tonight;  to also check CPK , spine and neuro exam unrevealing  Orders: TLB-CK Total Only(Creatine Kinase/CPK) (82550-CK)  Problem # 4:  LEG CRAMPS (ICD-729.82)  right calf and nocturnal only, etiology unclear, doubt claudiactin - to check lytes and mg, already taking the b complex vitamin   Orders: TLB-Magnesium (Mg) (83735-MG)  Problem # 5:  FATIGUE (ICD-780.79) exam benign, to check labs below; follow with expectant management  Orders: TLB-Sedimentation Rate (ESR) (85652-ESR) TLB-TSH (Thyroid Stimulating Hormone) (84443-TSH) TLB-B12 + Folate Pnl [id number])  Complete Medication List: 1)  Furosemide 40 Mg Tabs (Furosemide) .... Take 1  tablet by mouth once daily as needed swelling 2)  Norvasc 10 Mg Tabs (Amlodipine besylate) .Marland Kitchen.. 1 by mouth once daily 3)  Levothroid 112 Mcg Tabs (Levothyroxine sodium) .Marland Kitchen.. 1 by mouth once daily 4)  Ecotrin 325 Mg Tbec (Aspirin) .Marland Kitchen.. 1 by mouth once daily 5)  Simvastatin 80 Mg Tabs (Simvastatin) .Marland Kitchen.. 1 by mouth 3 x per wk 6)  Nystatin 100000 Unit/gm Powd (Nystatin) .... Use asd to affected area two times a day as needed 7)  Flexeril 5 Mg Tabs (Cyclobenzaprine hcl) .Marland Kitchen.. 1 by mouth three times a day as needed 8)  Daily Multi Tabs (Multiple vitamins-minerals) .Marland Kitchen.. 1 by mouth once daily 9)  Fish Oil 1000 Mg Caps (Omega-3 fatty acids) .Marland Kitchen.. 1 by mouth once daily 10)  Clonidine Hcl 0.1 Mg Tabs (Clonidine hcl) .Marland Kitchen.. 1 by mouth two times a day 11)  Sertraline  Hcl 100 Mg Tabs (Sertraline hcl) .... 2 by mouth once daily 12)  Hydrocodone-homatropine 5-1.5 Mg/62ml Syrp (Hydrocodone-homatropine) .Marland Kitchen.. 1 tsp by mouth q 6 hrs as needed cough 13)  Meclizine Hcl 12.5 Mg Tabs (Meclizine hcl) .Marland Kitchen.. 1 by mouth q 6 hrs as needed 14)  Promethazine Hcl 25 Mg Tabs (Promethazine hcl) .Marland Kitchen.. 1 by mouth q 6 hrs as needed nausea 15)  Omeprazole 20 Mg Cpdr (Omeprazole) .... 2 by mouth once daily 16)  B-100 Complex Tabs (Vitamins-lipotropics) .... Once daily 17)  Vitamin D 400 Unit Tabs (Cholecalciferol) .... Once daily 18)  Humira Pen 40 Mg/0.40ml Kit (Adalimumab)  Patient Instructions: 1)  for now, drink more fluids in the next few days 2)  Continue all previous medications as before this visit, except hold off on the Humira tonight 3)  Please keep your appt with Dr Ronnald Ramp tomorrow as you have planned 4)  Please go to the Lab in the basement for your blood and/or urine tests today  5)  Please schedule a follow-up appointment in 1 month with CPX labs for yearly exam

## 2010-03-23 NOTE — Progress Notes (Signed)
Summary: Lexapro PA  Phone Note From Pharmacy   [facility]er: [facility]  Tana Felts [id number]* Call For: [phone]  [id number]  Case:  Summary of Call: Rec'd fax from pharmacy that patient Lexapro requires PA.  Per [employer] the patient must have tried and failed Citalopram, fluoxetine, and  sertraline before we can attempt to get brand name Lexapro covered. Please advise. Initial call taken by: Ernestene Mention,  February 21, 2009 9:02 AM  Follow-up for Phone Call        please call to ask pt if she is willing to try the sertraline (zoloft) as this should work as well (just a small higher chance of diarrhea with this one) Follow-up by: Biagio Borg MD,  February 21, 2009 10:05 AM  Additional Follow-up for Phone Call Additional follow up Details #1::        pt is willing to try sertraline, after I explained that the PA may not be approved because per pt she has not tried and failed on any other of the other medication.  Additional Follow-up by: Crissie Sickles, CMA,  February 21, 2009 10:39 AM    Additional Follow-up for Phone Call Additional follow up Details #2::    done escript - but let pt know with this one she has to start slow and titrate up to avoid nausea and diarrhea -   start with 50 mg (half of one pill) for 3 days, then 1 pill (100 mg) for 3 days, then 1+ 1/2 pills for 3 days, then 2 pills per day after that Follow-up by: Biagio Borg MD,  February 21, 2009 1:12 PM  Additional Follow-up for Phone Call Additional follow up Details #3:: Details for Additional Follow-up Action Taken: pt informed Additional Follow-up by: Crissie Sickles, CMA,  February 21, 2009 2:21 PM  New/Updated Medications: SERTRALINE HCL 100 MG TABS (SERTRALINE HCL) 2 by mouth once daily Prescriptions: SERTRALINE HCL 100 MG TABS (SERTRALINE HCL) 2 by mouth once daily  #60 x 11   Entered and Authorized by:   Biagio Borg MD   Signed by:   Biagio Borg MD on 02/21/2009   Method used:   [facility]          [phone][facility] (retail)       [address]       [place], [place]  [address]       Ph: [phone]       Fax: [phone]   RxID:   [id number]

## 2010-03-23 NOTE — Progress Notes (Signed)
Summary: rx refill req  Phone Note From Pharmacy   Caller: [facility]. Summary of Call: Pt needs refills on Norvasc and Clonidine sent to [facility]. Initial call taken by: Rebeca Alert CMA Deborra Medina),  February 28, 2010 11:36 AM  Follow-up for Phone Call        called pt. to inform will send in prescriptions. Patient has changed pharmacy's due to insurance and now uses [facility] [address] Follow-up by: Shirlean Mylar Ewing CMA ([place]),  February 28, 2010 12:00 PM    Prescriptions: CLONIDINE HCL 0.1 MG TABS (CLONIDINE HCL) 1 by mouth two times a day  #60 x 7   Entered by:   Shirlean Mylar Ewing CMA ([place])   Authorized by:   Biagio Borg MD   Signed by:   Sharon Seller CMA ([place]) on 02/28/2010   Method used:   Faxed to ...       [facility] [phone]* (retail)       [place], [place]  [address]       Ph: [phone]       Fax: [phone]   RxID:   [id number] NORVASC 10 MG  TABS (AMLODIPINE BESYLATE) 1 by mouth once daily  #90 x 2   Entered by:   Sharon Seller CMA ([place])   Authorized by:   Biagio Borg MD   Signed by:   Sharon Seller CMA ([place]) on 02/28/2010   Method used:   Faxed to ...       [facility] [phone]* (retail)       [address]       [place], [place]  [address]       Ph: [phone]       Fax: [phone]   RxID:   [id number]

## 2010-03-23 NOTE — Letter (Signed)
Summary: EGD Instructions  [facility]  [place], [place] [address]   Phone: [phone]  Fax: [phone]       Heather Dawson    Jan 17, 1956    MRN: [id number]       Procedure Day /Date:WEDNESDAY 04/13/2009     Arrival Time:10AM     Procedure Time:11AM     Location of Procedure:                    X  [place] (4th Floor)   PREPARATION FOR ENDOSCOPY   On 04/13/2009  THE DAY OF THE PROCEDURE:  1.   No solid foods, milk or milk products are allowed after midnight the night before your procedure.  2.   Do not drink anything colored red or purple.  Avoid juices with pulp.  No orange juice.  3.  You may drink clear liquids until 9AM, which is 2 hours before your procedure.                                                                                                CLEAR LIQUIDS INCLUDE: Water Jello Ice Popsicles Tea (sugar ok, no milk/cream) Powdered fruit flavored drinks Coffee (sugar ok, no milk/cream) Gatorade Juice: apple, white grape, white cranberry  Lemonade Clear bullion, consomm, broth Carbonated beverages (any kind) Strained chicken noodle soup Hard Candy   MEDICATION INSTRUCTIONS  Unless otherwise instructed, you should take regular prescription medications with a small sip of water as early as possible the morning of your procedure.            OTHER INSTRUCTIONS  You will need a responsible adult at least 54 years of age to accompany you and drive you home.   This person must remain in the waiting room during your procedure.  Wear loose fitting clothing that is easily removed.  Leave jewelry and other valuables at home.  However, you may wish to bring a book to read or an iPod/MP3 player to listen to music as you wait for your procedure to start.  Remove all body piercing jewelry and leave at home.  Total time from sign-in until discharge is approximately 2-3 hours.  You should go home directly after  your procedure and rest.  You can resume normal activities the day after your procedure.  The day of your procedure you should not:   Drive   Make legal decisions   Operate machinery   Drink alcohol   Return to work  You will receive specific instructions about eating, activities and medications before you leave.    The above instructions have been reviewed and explained to me by   _______________________    I fully understand and can verbalize these instructions _____________________________ Date _________

## 2010-03-23 NOTE — Letter (Signed)
Summary: [facility]   Imported By: Bubba Hales 05/05/2009 10:44:39  _____________________________________________________________________  External Attachment:    Type:   Image     Comment:   External Document

## 2010-03-23 NOTE — Letter (Signed)
Summary: Out of Work  [facility] [place], [place] [address]   Phone: [phone]  Fax: [phone]    Jul 06, 2009   Employee:  Sebastiana P Mcanany    To Whom It May Concern:   For Medical reasons, please excuse the above named employee from work for the following dates:  Start:   Jul 04, 2009  End:   Jul 10, 2009  ---    to return to work Jul 11, 2009 without restriction  If you need additional information, please feel free to contact our office.         Sincerely,    Biagio Borg MD

## 2010-03-23 NOTE — Assessment & Plan Note (Signed)
Summary: hemotochezia.Marland Kitchenem    History of Present Illness Visit Type: Follow-up Consult Primary GI MD: Erskine Emery MD [facility] Primary Provider: [name], MD Requesting Provider: [name], MD Chief Complaint: 1 occurance, 1 month ago of rectal bleeding History of Present Illness:   Heather Dawson is a pleasant 55 year old white female referred at the request of Dr. Jenny Reichmann for rectal bleeding.  In the setting of an upper respiratory infection and diarrhea she noted several days of bright red blood mixed with her stools.  She was without abdominal pain or rectal pain.  Symptoms have subsided including her bleeding.  Colonoscopy in 2005 was normal.  She does complain of long-standing burning chest discomfort. She has a history of erosive esophagitis. She is also complaining of abdominal bloating and upper abdominal soreness upon awakening.  Process has improved since taking omeprazole. A recent CBC was unremarkable.   GI Review of Systems    Reports abdominal pain, acid reflux, bloating, nausea, and  vomiting.     Location of  Abdominal pain: epigastric area.    Denies belching, chest pain, dysphagia with liquids, dysphagia with solids, heartburn, loss of appetite, vomiting blood, weight loss, and  weight gain.      Reports change in bowel habits, light color stool, and  rectal bleeding.     Denies anal fissure, black tarry stools, constipation, diarrhea, diverticulosis, fecal incontinence, heme positive stool, hemorrhoids, irritable bowel syndrome, jaundice, liver problems, and  rectal pain.    Current Medications (verified): 1)  Furosemide 40 Mg Tabs (Furosemide) .... Take 1  Tablet By Mouth Once Daily As Needed Swelling 2)  Norvasc 10 Mg  Tabs (Amlodipine Besylate) .Marland Kitchen.. 1 By Mouth Once Daily 3)  Levothroid 112 Mcg Tabs (Levothyroxine Sodium) .Marland Kitchen.. 1 By Mouth Once Daily 4)  Ecotrin 325 Mg Tbec (Aspirin) .Marland Kitchen.. 1 By Mouth Once Daily 5)  Simvastatin 80 Mg Tabs (Simvastatin) .Marland Kitchen.. 1 By Mouth 3 X Per  Wk 6)  Nystatin 100000 Unit/gm Powd (Nystatin) .... Use Asd To Affected Area Two Times A Day As Needed 7)  Flexeril 5 Mg Tabs (Cyclobenzaprine Hcl) .Marland Kitchen.. 1 By Mouth Three Times A Day As Needed 8)  Daily Multi  Tabs (Multiple Vitamins-Minerals) .Marland Kitchen.. 1 By Mouth Once Daily 9)  Fish Oil 1000 Mg Caps (Omega-3 Fatty Acids) .Marland Kitchen.. 1 By Mouth Once Daily 10)  Clonidine Hcl 0.1 Mg Tabs (Clonidine Hcl) .Marland Kitchen.. 1 By Mouth Two Times A Day 11)  Sertraline Hcl 100 Mg Tabs (Sertraline Hcl) .... 2 By Mouth Once Daily 12)  Hydrocodone-Homatropine 5-1.5 Mg/44ml Syrp (Hydrocodone-Homatropine) .Marland Kitchen.. 1 Tsp By Mouth Q 6 Hrs As Needed Cough 13)  Meclizine Hcl 12.5 Mg Tabs (Meclizine Hcl) .Marland Kitchen.. 1 By Mouth Q 6 Hrs As Needed 14)  Promethazine Hcl 25 Mg Tabs (Promethazine Hcl) .Marland Kitchen.. 1 By Mouth Q 6 Hrs As Needed Nausea 15)  Omeprazole 20 Mg Cpdr (Omeprazole) .... 2 By Mouth Once Daily 16)  B-100 Complex  Tabs (Vitamins-Lipotropics) .... Once Daily 17)  Vitamin D 400 Unit Tabs (Cholecalciferol) .... Once Daily  Allergies (verified): 1)  ! * Labetotal 2)  ! Ceftin  Past History:  Past Medical History: Reviewed history from 11/18/2007 and no changes required. 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 brain 12/2004, and twice aug and sept 2009 Cerebrovascular disease - 60% right MCA stenosis  DIABETES MELLITUS, TYPE II (ICD-250.00) OVARIAN CYST (ICD-620.2) ALLERGIC RHINITIS (ICD-477.9) HYPERLIPIDEMIA (ICD-272.4) FAMILY HISTORY DIABETES 1ST DEGREE RELATIVE (ICD-V18.0) RENAL INSUFFICIENCY (ICD-588.9) CIRRHOSIS (ICD-571.5) CHOLERA, 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 Family History Diabetes 1st degree relative Family History Hypertension father with colon polyps, and TIA x 4  Social History: Reviewed history from 01/10/2009 and no changes required. 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  The patient denies anemia, anxiety-new, arthritis/joint pain, blood in urine, breast changes/lumps, change in vision, confusion, coughing up blood, depression-new, fainting, fatigue, fever, headaches-new, hearing problems, heart murmur, heart rhythm changes, menstrual pain, muscle pains/cramps, nosebleeds, pregnancy symptoms, shortness of breath, skin rash, sore throat, swollen lymph glands, thirst - excessive , urination - excessive , urination changes/pain, vision changes, and voice change.         All other systems were reviewed and were negative   Vital Signs:  Patient profile:   55 year old female Height:      62 inches Weight:      219 pounds BMI:     40.20 Pulse rate:   76 / minute Pulse rhythm:   regular BP sitting:   130 / 92  (left arm) Cuff size:   regular  Vitals Entered By: June McMurray [place] Deborra Medina) (April 12, 2009 11:24 AM)  Physical Exam  Additional Exam:  On physical exam she is an obese female  skin: anicteric HEENT: normocephalic; PEERLA; no nasal or pharyngeal abnormalities neck:  supple nodes: no cervical lymphadenopathy chest: clear to ausculatation and percussion heart: no murmurs, gallops, or rubs abd: soft, nontender; BS normoactive; no abdominal masses, tenderness, organomegaly rectal: deferred ext: no cynanosis, clubbing, edema skeletal: no deformities neuro: oriented x 3; no focal abnormalities    Impression & Recommendations:  Problem # 1:  HEMATOCHEZIA (ICD-578.1)  2005 colonoscopy was negative.  Symptoms most likely due to hemorrhoidal disease.  Recommendations #1 stool Hemoccults; if positive I will proceed with colonoscopy  Orders: EGD (EGD)  Problem # 2:  ABDOMINAL PAIN, EPIGASTRIC (ICD-789.06)  Symptoms could be due to ulcer or nonulcer dyspepsia.  Recommendations #1 continue on her omeprazole #2 upper endoscopy  Risks, alternatives, and complications of the procedure, including bleeding, perforation, and possible need for surgery, were explained to the patient.  Patient's questions were answered.  Orders: EGD (EGD)  Problem # 3:  ESOPHAGEAL REFLUX (ICD-530.81) Assessment: Improved  The patient has a history of erosive esophagitis.  Recommendations #1continue omeprazole  Orders: EGD (EGD)  Patient Instructions: 1)  Your EGD is scheduled for 04/13/2009 at 11am 2)  The medication list was reviewed and reconciled.  All changed / newly prescribed medications were explained.  A complete medication list was provided to the patient / caregiver.

## 2010-03-23 NOTE — Assessment & Plan Note (Signed)
Summary: fatigue  stc   Vital Signs:  Patient profile:   55 year old female Height:      62 inches Weight:      219.50 pounds BMI:     40.29 O2 Sat:      97 % on Room air Temp:     98.1 degrees F oral Pulse rate:   64 / minute BP supine:   128 / 84 BP sitting:   122 / 78  (left arm) BP standing:   140 / 90 Cuff size:   large  Vitals Entered ByShirlean Mylar Ewing (Jul 06, 2009 1:42 PM)  O2 Flow:  Room air CC: Fatigue for 5 days, nauseated/RE   Primary Care Provider:  [name], MD  CC:  Fatigue for 5 days and nauseated/RE.  History of Present Illness: here after recet tx for UTI, with significant worse fatigue for 5 days with exhaustion;  the first day had to sleep an her lunch hour in the car;  seemed to get worse the next few days; missed work since mon this wk;  had some swelling to the legs to the knees starting last friday better with laxis and leg elevation - now resolved, but now feels lightheaded and dizzy, no energy, exhausted.  Has Some nausea but overall mild, no vomiting, dysphagia, abd pain, bowle change, worsening GU symtpoms , rash, joint pains,  but has also had right sided intermittent headache and diffue aching all over as well.  She seems to think it may be from the cipro and hopes to change.  Did stop the cipro last friday, but still could not get out of bed the next 2 days (last weekend);  so did take 6 days total cipro.  Pt denies CP, sob, doe, wheezing, orthopnea, pnd, worsening LE edema, palps, dizziness or syncope  Pt denies new neuro symptoms such as headache, facial or extremity weakness  Seems overall nervous today. Denies increased stress or depressive symptoms recently.  Denies OSA symptoms such as snoring or daytime somnolence.    Problems Prior to Update: 1)  Uti  (ICD-599.0) 2)  Fatigue  (ICD-780.79) 3)  Leg Cramps  (ICD-729.82) 4)  Leg Pain, Bilateral  (ICD-729.5) 5)  Back Pain  (ICD-724.5) 6)  Diarrhea  (ICD-787.91) 7)  Esophageal Reflux   (ICD-530.81) 8)  Hematochezia  (ICD-578.1) 9)  Abdominal Pain, Epigastric  (ICD-789.06) 10)  Polyuria  (ICD-788.42) 11)  Sinusitis- Acute-nos  (ICD-461.9) 12)  Ganglion Cyst, Tendon Sheath  (ICD-727.42) 13)  Headache  (ICD-784.0) 14)  Contusion, Lower Leg  (ICD-924.10) 15)  Contact Dermatitis  (ICD-692.9) 16)  Abdominal Pain, Generalized  (ICD-789.07) 17)  Preventive Health Care  (ICD-V70.0) 18)  Cellulitis and Abscess of Other Specified Site  (ICD-682.8) 19)  Back Pain  (ICD-724.5) 20)  Rash-nonvesicular  (ICD-782.1) 21)  Bradycardia  (ICD-427.89) 22)  Weakness  (ICD-780.79) 23)  Cellulitis, Trunk  (ICD-682.2) 24)  Encounter For Long-term Use of Other Medications  (ICD-V58.69) 25)  Skin Rash  (ICD-782.1) 26)  Transient Ischemic Attack  (ICD-435.9) 27)  Carpal Tunnel Syndrome  (ICD-354.0) 28)  Vertigo  (ICD-780.4) 29)  Otitis Media, Serous, Right  (ICD-381.4) 30)  Esophagitis  (ICD-530.10) 31)  Dysuria  (ICD-788.1) 32)  Vitamin B12 Deficiency  (ICD-266.2) 33)  Fatigue  (ICD-780.79) 34)  Abdominal Pain, Epigastric  (ICD-789.06) 35)  Shingles, Hx of  (ICD-V13.8) 36)  Common Migraine  (ICD-346.10) 37)  Gerd  (ICD-530.81) 38)  Transient Ischemic Attacks, Hx of  (ICD-V12.50) 39)  Diabetes Mellitus, Type II  (ICD-250.00) 40)  Ovarian Cyst  (ICD-620.2) 41)  Allergic Rhinitis  (ICD-477.9) 42)  Hyperlipidemia  (ICD-272.4) 80)  Family History Diabetes 1st Degree Relative  (ICD-V18.0) 19)  Renal Insufficiency  (ICD-588.9) 29)  Cirrhosis  (ICD-571.5) 46)  Cholera, 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 Take 1  Tablet By Mouth Once Daily As Needed Swelling 2)  Norvasc  10 Mg  Tabs (Amlodipine Besylate) .Marland Kitchen.. 1 By Mouth Once Daily 3)  Levothroid 112 Mcg Tabs (Levothyroxine Sodium) .Marland Kitchen.. 1 By Mouth Once Daily 4)  Ecotrin 325 Mg Tbec (Aspirin) .Marland Kitchen.. 1 By Mouth Once Daily 5)  Simvastatin 80 Mg Tabs (Simvastatin) .Marland Kitchen.. 1 By Mouth 3 X Per Wk 6)  Nystatin 100000 Unit/gm Powd (Nystatin) .... Use Asd To Affected Area Two Times A Day As Needed 7)  Flexeril 5 Mg Tabs (Cyclobenzaprine Hcl) .Marland Kitchen.. 1 By Mouth Three Times A Day As Needed 8)  Daily Multi  Tabs (Multiple Vitamins-Minerals) .Marland Kitchen.. 1 By Mouth Once Daily 9)  Fish Oil 1000 Mg Caps (Omega-3 Fatty Acids) .Marland Kitchen.. 1 By Mouth Once Daily 10)  Clonidine Hcl 0.1 Mg Tabs (Clonidine Hcl) .Marland Kitchen.. 1 By Mouth Two Times A Day 11)  Sertraline Hcl 100 Mg Tabs (Sertraline Hcl) .... 2 By Mouth Once Daily 12)  Hydrocodone-Homatropine 5-1.5 Mg/26ml Syrp (Hydrocodone-Homatropine) .Marland Kitchen.. 1 Tsp By Mouth Q 6 Hrs As Needed Cough 13)  Meclizine Hcl 12.5 Mg Tabs (Meclizine Hcl) .Marland Kitchen.. 1 By Mouth Q 6 Hrs As Needed 14)  Promethazine Hcl 25 Mg Tabs (Promethazine Hcl) .Marland Kitchen.. 1 By Mouth Q 6 Hrs As Needed Nausea 15)  Omeprazole 20 Mg Cpdr (Omeprazole) .... 2 By Mouth Once Daily 16)  B-100 Complex  Tabs (Vitamins-Lipotropics) .... Once Daily 17)  Vitamin D 400 Unit Tabs (Cholecalciferol) .... Once Daily 18)  Humira Pen 40 Mg/0.66ml Kit (Adalimumab) 19)  Ciprofloxacin Hcl 500 Mg Tabs (Ciprofloxacin Hcl) .Marland Kitchen.. 1 By Mouth Two Times A Day  Current Medications (verified): 1)  Furosemide 40 Mg Tabs (Furosemide) .... Take 1  Tablet By Mouth Once Daily As Needed Swelling 2)  Norvasc 10 Mg  Tabs (Amlodipine Besylate) .Marland Kitchen.. 1 By Mouth Once Daily 3)  Levothroid 112 Mcg Tabs (Levothyroxine Sodium) .Marland Kitchen.. 1 By Mouth Once Daily 4)  Ecotrin 325 Mg Tbec (Aspirin) .Marland Kitchen.. 1 By Mouth Once Daily 5)  Simvastatin 80 Mg Tabs (Simvastatin) .Marland Kitchen.. 1 By Mouth 3 X Per Wk 6)  Nystatin 100000 Unit/gm Powd (Nystatin) .... Use Asd To Affected Area Two Times A Day As Needed 7)  Flexeril 5 Mg Tabs  (Cyclobenzaprine Hcl) .Marland Kitchen.. 1 By Mouth Three Times A Day As Needed 8)  Daily Multi  Tabs (Multiple Vitamins-Minerals) .Marland Kitchen.. 1 By Mouth Once Daily 9)  Fish Oil 1000 Mg Caps (Omega-3 Fatty Acids) .Marland Kitchen.. 1 By Mouth Once Daily 10)  Clonidine Hcl 0.1 Mg Tabs (Clonidine Hcl) .Marland Kitchen.. 1 By Mouth Two Times A Day 11)  Sertraline Hcl 100 Mg Tabs (Sertraline Hcl) .... 2 By Mouth Once Daily 12)  Hydrocodone-Homatropine 5-1.5 Mg/54ml Syrp (Hydrocodone-Homatropine) .Marland Kitchen.. 1 Tsp By Mouth Q 6 Hrs As Needed Cough 13)  Meclizine Hcl 12.5 Mg Tabs (Meclizine Hcl) .Marland Kitchen.. 1 By Mouth  Q 6 Hrs As Needed 14)  Promethazine Hcl 25 Mg Tabs (Promethazine Hcl) .Marland Kitchen.. 1 By Mouth Q 6 Hrs As Needed Nausea 15)  Omeprazole 20 Mg Cpdr (Omeprazole) .... 2 By Mouth Once Daily 16)  B-100 Complex  Tabs (Vitamins-Lipotropics) .... Once Daily 17)  Vitamin D 400 Unit Tabs (Cholecalciferol) .... Once Daily 18)  Humira Pen 40 Mg/0.61ml Kit (Adalimumab) 19)  Ciprofloxacin Hcl 500 Mg Tabs (Ciprofloxacin Hcl) .Marland Kitchen.. 1 By Mouth Two Times A Day  Allergies (verified): 1)  ! * Labetotal 2)  ! 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 12/2004, and twice aug and sept 2009 Cerebrovascular disease - 60% right MCA stenosis DIABETES MELLITUS, TYPE II (ICD-250.00) OVARIAN CYST (ICD-620.2) ALLERGIC RHINITIS (ICD-477.9) HYPERLIPIDEMIA (ICD-272.4) FAMILY HISTORY DIABETES 1ST DEGREE RELATIVE (ICD-V18.0) RENAL INSUFFICIENCY (ICD-588.9) CIRRHOSIS (ICD-571.5) CHOLERA, 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) DEPRESSION (ICD-311) ANXIETY (ICD-300.00) CARPAL TUNNEL SYNDROME, BILATERAL (ICD-354.0) MORBID OBESITY  (ICD-278.01) MIGRAINE HEADACHE (ICD-346.90) HYPOTHYROIDISM (ICD-244.9) HYPERTENSION (ICD-401.9)  Past Surgical History: Last updated: 04/12/2009 Hysterectomy 01/08 EGD (06/06/05) Stress Cardiolite (04/17/01) Echocardiogram (02/02/02) Cholecystectomy Neck surgery  Social History: Last updated: 01/10/2009 Former Smoker Alcohol use-no Married (second) 2 children 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   Head:  normocephalic and atraumatic.   Eyes:  vision grossly intact, pupils equal, and pupils round.   Ears:  R ear normal and L ear normal.   Nose:  no external deformity and no nasal discharge.   Mouth:  no gingival abnormalities and pharynx pink and moist.   Neck:  supple and no masses.   Lungs:  normal respiratory effort and normal breath sounds.   Heart:  normal rate and regular rhythm.   Abdomen:  soft, non-tender, and normal bowel sounds.   Msk:  no joint tenderness and no joint swelling.   Extremities:  no edema, no erythema  Neurologic:  cranial nerves II-XII intact and strength normal in all extremities.   Skin:  color normal and no rashes.   Psych:  not depressed appearing and severely anxious.     Impression & Recommendations:  Problem # 1:  FATIGUE (ICD-780.79)  unclear etiology  and not orthostatic today and exam benign overall for acute  - ? med related  vs UTI (post tx fatigue)  vs other such as rhabdo, or even anxiety;  doubt chronic med related or osa - to re-check urine studies, cpk, hgb;  ok to cont off the cipro for now  Orders: TLB-BMP (Basic Metabolic Panel-BMET) (99991111) TLB-CBC Platelet - w/Differential (85025-CBCD) TLB-Hepatic/Liver Function Pnl (80076-HEPATIC) TLB-Sedimentation Rate  (ESR) (85652-ESR) TLB-B12 + Folate Pnl [id number]) TLB-CK Total Only(Creatine Kinase/CPK) (82550-CK)  Problem # 2:  DIARRHEA (ICD-787.91) resolved - none since last visit, ok to follow, exam benign  Problem # 3:  UTI (ICD-599.0)  Her updated medication list for this problem includes:    Ciprofloxacin Hcl 500 Mg Tabs (Ciprofloxacin hcl) .Marland Kitchen... 1 by mouth two times a day as above -exam  benign, to re-check urine studies, hold further antbx for now, to stop the cipro as she has  Orders: T-Culture, Urine [id number]) TLB-Udip w/ Micro (81001-URINE)  Problem # 4:  HYPOTHYROIDISM (ICD-244.9)  Her updated medication list for this problem includes:    Levothroid 112 Mcg Tabs (Levothyroxine sodium) .Marland Kitchen... 1 by mouth once daily  Labs Reviewed: TSH: 4.89 (06/20/2009)    HgBA1c: 6.1 (03/21/2009) Chol: 229 (01/10/2009)   HDL: 46.50 (01/10/2009)   LDL: 114 (07/07/2008)   TG: 181.0 (01/10/2009) stable overall by hx and exam, ok to continue meds/tx as is   Complete Medication List: 1)  Furosemide 40 Mg Tabs (Furosemide) .... Take 1  tablet by mouth once daily as needed swelling 2)  Norvasc 10 Mg Tabs (Amlodipine besylate) .Marland Kitchen.. 1 by mouth once daily 3)  Levothroid 112 Mcg Tabs (Levothyroxine sodium) .Marland Kitchen.. 1 by mouth once daily 4)  Ecotrin 325 Mg Tbec (Aspirin) .Marland Kitchen.. 1 by mouth once daily 5)  Simvastatin 80 Mg Tabs (Simvastatin) .Marland Kitchen.. 1 by mouth 3 x per wk 6)  Nystatin 100000 Unit/gm Powd (Nystatin) .... Use asd to affected area two times a day as needed 7)  Flexeril 5 Mg Tabs (Cyclobenzaprine hcl) .Marland Kitchen.. 1 by mouth three times a day as needed 8)  Daily Multi Tabs (Multiple vitamins-minerals) .Marland Kitchen.. 1 by mouth once daily 9)  Fish Oil 1000 Mg Caps (Omega-3 fatty acids) .Marland Kitchen.. 1 by mouth once daily 10)  Clonidine Hcl 0.1 Mg Tabs (Clonidine hcl) .Marland Kitchen.. 1 by mouth two times a day 11)  Sertraline Hcl 100 Mg Tabs (Sertraline hcl) .... 2 by mouth once daily 12)  Hydrocodone-homatropine 5-1.5 Mg/45ml Syrp  (Hydrocodone-homatropine) .Marland Kitchen.. 1 tsp by mouth q 6 hrs as needed cough 13)  Meclizine Hcl 12.5 Mg Tabs (Meclizine hcl) .Marland Kitchen.. 1 by mouth q 6 hrs as needed 14)  Promethazine Hcl 25 Mg Tabs (Promethazine hcl) .Marland Kitchen.. 1 by mouth q 6 hrs as needed nausea 15)  Omeprazole 20 Mg Cpdr (Omeprazole) .... 2 by mouth once daily 16)  B-100 Complex Tabs (Vitamins-lipotropics) .... Once daily 17)  Vitamin D 400 Unit Tabs (Cholecalciferol) .... Once daily 18)  Humira Pen 40 Mg/0.86ml Kit (Adalimumab) 19)  Ciprofloxacin Hcl 500 Mg Tabs (Ciprofloxacin hcl) .Marland Kitchen.. 1 by mouth two times a day  Patient Instructions: 1)  Please go to the Lab in the basement for your blood and/or urine tests today  2)  ok to stay off the antibiotic for now 3)  you are given the work note for one wk 4)  Continue all previous medications as before this visit  5)  Please schedule a follow-up appointment as needed. Prescriptions: PROMETHAZINE HCL 25 MG TABS (PROMETHAZINE HCL) 1 by mouth q 6 hrs as needed nausea  #40 x 1   Entered and Authorized by:   Biagio Borg MD   Signed by:   Biagio Borg MD on 07/06/2009   Method used:   Print then Give to Patient   RxID:   [id number]

## 2010-03-23 NOTE — Assessment & Plan Note (Signed)
Summary: STRESS  --STC   Vital Signs:  Patient profile:   55 year old female Height:      62 inches Weight:      222.38 pounds BMI:     40.82 O2 Sat:      96 % on Room air Temp:     98.2 degrees F oral Pulse rate:   89 / minute BP sitting:   112 / 70  (left arm) Cuff size:   large  Vitals Entered By: Shirlean Mylar Ewing CMA Deborra Medina) (December 29, 2009 1:52 PM)  O2 Flow:  Room air CC: Stress/RE   Primary Care Megan Hayduk:  [name], MD  CC:  Stress/RE.  History of Present Illness: here to f/u  - c/o severe ongoing anxiety symtpoms and stress, with depressive symptoms, but no suicidal ideaiton or panic -- husband disabled , no longer working, she really dislikes her job but cant quit due to losing the benefits for her and the husband; and boss she likes is leaving the workplace;  sertraline makes her sleepy and she has tried several times;  Pt denies CP, worsening sob, doe, wheezing, orthopnea, pnd, worsening LE edema, palps, dizziness or syncope Pt denies new neuro symptoms such as headache, facial or extremity weakness  Denies worsening depressive symptoms, suicidal ideation, or panic, but has been close a few times.  No fever, wt loss, night sweats, loss of appetite or other constitutional symptoms .  Pt denies polydipsia, polyuria, or low sugar symptoms such as shakiness improved with eating.  Overall good compliance with meds, trying to follow low chol, DM diet, wt stable, little excercise however   Problems Prior to Update: 1)  Chest Pain  (ICD-786.50) 2)  Preventive Health Care  (ICD-V70.0) 3)  Uti  (ICD-599.0) 4)  Fatigue  (ICD-780.79) 5)  Leg Cramps  (ICD-729.82) 6)  Leg Pain, Bilateral  (ICD-729.5) 7)  Back Pain  (ICD-724.5) 8)  Diarrhea  (ICD-787.91) 9)  Esophageal Reflux  (ICD-530.81) 10)  Hematochezia  (ICD-578.1) 11)  Abdominal Pain, Epigastric  (ICD-789.06) 12)  Polyuria  (ICD-788.42) 13)  Sinusitis- Acute-nos  (ICD-461.9) 14)  Ganglion Cyst, Tendon Sheath   (ICD-727.42) 15)  Headache  (ICD-784.0) 16)  Contusion, Lower Leg  (ICD-924.10) 17)  Contact Dermatitis  (ICD-692.9) 18)  Abdominal Pain, Generalized  (ICD-789.07) 19)  Preventive Health Care  (ICD-V70.0) 20)  Cellulitis and Abscess of Other Specified Site  (ICD-682.8) 21)  Back Pain  (ICD-724.5) 22)  Rash-nonvesicular  (ICD-782.1) 23)  Bradycardia  (ICD-427.89) 24)  Weakness  (ICD-780.79) 25)  Cellulitis, Trunk  (ICD-682.2) 26)  Encounter For Long-term Use of Other Medications  (ICD-V58.69) 27)  Skin Rash  (ICD-782.1) 28)  Transient Ischemic Attack  (ICD-435.9) 29)  Carpal Tunnel Syndrome  (ICD-354.0) 30)  Vertigo  (ICD-780.4) 31)  Otitis Media, Serous, Right  (ICD-381.4) 32)  Esophagitis  (ICD-530.10) 33)  Dysuria  (ICD-788.1) 34)  Vitamin B12 Deficiency  (ICD-266.2) 35)  Fatigue  (ICD-780.79) 36)  Abdominal Pain, Epigastric  (ICD-789.06) 37)  Shingles, Hx of  (ICD-V13.8) 38)  Common Migraine  (ICD-346.10) 39)  Gerd  (ICD-530.81) 40)  Transient Ischemic Attacks, Hx of  (ICD-V12.50) 41)  Diabetes Mellitus, Type II  (ICD-250.00) 42)  Ovarian Cyst  (ICD-620.2) 43)  Allergic Rhinitis  (ICD-477.9) 44)  Hyperlipidemia  (ICD-272.4) 8)  Family History Diabetes 1st Degree Relative  (ICD-V18.0) 86)  Renal Insufficiency  (ICD-588.9) 8)  Cirrhosis  (ICD-571.5) 67)  Cholera, 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 Take 1  Tablet By Mouth Once Daily As Needed Swelling 2)  Norvasc 10 Mg  Tabs (Amlodipine Besylate) .Marland Kitchen.. 1 By Mouth Once Daily 3)  Levothroid 125 Mcg Tabs (Levothyroxine Sodium) .Marland Kitchen.. 1po Once Daily 4)  Ecotrin 325 Mg Tbec (Aspirin) .Marland Kitchen.. 1 By Mouth Once  Daily 5)  Simvastatin 80 Mg Tabs (Simvastatin) .Marland Kitchen.. 1 By Mouth 3 X Per Wk 6)  Nystatin 100000 Unit/gm Powd (Nystatin) .... Use Asd To Affected Area Two Times A Day As Needed 7)  Flexeril 5 Mg Tabs (Cyclobenzaprine Hcl) .Marland Kitchen.. 1 By Mouth Three Times A Day As Needed 8)  Daily Multi  Tabs (Multiple Vitamins-Minerals) .Marland Kitchen.. 1 By Mouth Once Daily 9)  Fish Oil 1000 Mg Caps (Omega-3 Fatty Acids) .Marland Kitchen.. 1 By Mouth Once Daily 10)  Clonidine Hcl 0.1 Mg Tabs (Clonidine Hcl) .Marland Kitchen.. 1 By Mouth Two Times A Day 11)  Sertraline Hcl 100 Mg Tabs (Sertraline Hcl) .... 2 By Mouth Once Daily 12)  Meclizine Hcl 12.5 Mg Tabs (Meclizine Hcl) .Marland Kitchen.. 1 By Mouth Q 6 Hrs As Needed 13)  Omeprazole 20 Mg Cpdr (Omeprazole) .... 2 By Mouth Once Daily 14)  B-100 Complex  Tabs (Vitamins-Lipotropics) .... Once Daily 15)  Vitamin D 400 Unit Tabs (Cholecalciferol) .... Once Daily 16)  Humira Pen 40 Mg/0.36ml Kit (Adalimumab) 17)  Hydrocodone-Acetaminophen 7.5-325 Mg Tabs (Hydrocodone-Acetaminophen) .Marland Kitchen.. 1 By Mouth Q 6 Hrs As Needed Pain 18)  Azithromycin 250 Mg Tabs (Azithromycin) .... 2po Qd For 1 Day, Then 1po Qd For 4days, Then Stop 19)  Tessalon Perles 100 Mg Caps (Benzonatate) .Marland Kitchen.. 1-2 By Mouth Three Times A Day As Needed  Current Medications (verified): 1)  Furosemide 40 Mg Tabs (Furosemide) .... Take 1  Tablet By Mouth Once Daily As Needed Swelling 2)  Norvasc 10 Mg  Tabs (Amlodipine Besylate) .Marland Kitchen.. 1 By Mouth Once Daily 3)  Levothroid 125 Mcg Tabs (Levothyroxine Sodium) .Marland Kitchen.. 1po Once Daily 4)  Ecotrin 325 Mg Tbec (Aspirin) .Marland Kitchen.. 1 By Mouth Once Daily 5)  Simvastatin 80 Mg Tabs (Simvastatin) .Marland Kitchen.. 1 By Mouth 3 X Per Wk 6)  Nystatin 100000 Unit/gm Powd (Nystatin) .... Use Asd To Affected Area Two Times A Day As Needed 7)  Daily Multi  Tabs (Multiple Vitamins-Minerals) .Marland Kitchen.. 1 By Mouth Once Daily 8)  Fish Oil 1000 Mg Caps (Omega-3 Fatty Acids) .... 2 By Mouth Once Daily 9)  Clonidine Hcl 0.1 Mg Tabs (Clonidine Hcl) .Marland Kitchen.. 1 By Mouth Two  Times A Day 10)  Fluoxetine Hcl 20 Mg Caps (Fluoxetine Hcl) .Marland Kitchen.. 1po Once Daily 11)  Meclizine Hcl 12.5 Mg Tabs (Meclizine Hcl) .Marland Kitchen.. 1 By Mouth Q 6 Hrs As Needed 12)  Omeprazole 20 Mg Cpdr (Omeprazole) .... 2 By Mouth Once Daily 13)  B-100 Complex  Tabs (Vitamins-Lipotropics) .... Once Daily 14)  Vitamin D 400 Unit Tabs (Cholecalciferol) .... Once Daily 15)  Lisinopril 10 Mg Tabs (Lisinopril) .Marland Kitchen.. 1 By Mouth Once Daily  Allergies (verified): 1)  ! * Labetotal 2)  ! Ceftin  Past History:  Past Medical History: Last updated: 11/18/2007 ESOPHAGITIS (ICD-530.10) URI (ICD-465.9) DYSURIA (ICD-788.1) VITAMIN B12 DEFICIENCY (ICD-266.2) FATIGUE (ICD-780.79) ABDOMINAL PAIN, EPIGASTRIC (ICD-789.06) VIRAL GASTROENTERITIS (ICD-008.8) SHINGLES, HX OF (ICD-V13.8) COMMON MIGRAINE (  ICD-346.10) GERD (ICD-530.81) TRANSIENT ISCHEMIC ATTACKS, HX OF (ICD-V12.50) - right brain 12/2004, and twice aug and sept 2009 Cerebrovascular disease - 60% right MCA stenosis DIABETES MELLITUS, TYPE II (ICD-250.00) OVARIAN CYST (ICD-620.2) ALLERGIC RHINITIS (ICD-477.9) HYPERLIPIDEMIA (ICD-272.4) FAMILY HISTORY DIABETES 1ST DEGREE RELATIVE (ICD-V18.0) RENAL INSUFFICIENCY (ICD-588.9) CIRRHOSIS (ICD-571.5) CHOLERA, 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) DEPRESSION (ICD-311) ANXIETY (ICD-300.00) CARPAL TUNNEL SYNDROME, BILATERAL (ICD-354.0) MORBID OBESITY (ICD-278.01) MIGRAINE HEADACHE (ICD-346.90) HYPOTHYROIDISM (ICD-244.9) HYPERTENSION (ICD-401.9)  Past Surgical History: Last updated: 04/12/2009 Hysterectomy 01/08 EGD (06/06/05) Stress Cardiolite (04/17/01) Echocardiogram (02/02/02) Cholecystectomy Neck surgery  Social History: Last updated: 01/10/2009 Former Smoker Alcohol use-no Married (second) 2 children work - Barrister's clerk for damaged cars  note:  pt work cell phone :    [phone]  Risk Factors: Smoking Status: quit (12/20/2006)  Review of Systems       all otherwise negative per pt -     Physical Exam  General:  alert and overweight-appearing.   Head:  normocephalic and atraumatic.   Eyes:  vision grossly intact, pupils equal, and pupils round.   Ears:  R ear normal and L ear normal.   Nose:  no external deformity and no nasal discharge.   Mouth:  no gingival abnormalities and pharynx pink and moist.   Neck:  supple and no masses.   Lungs:  normal respiratory effort and normal breath sounds.   Heart:  normal rate and regular rhythm.   Extremities:  no edema, no erythema    Impression & Recommendations:  Problem # 1:  ANXIETY (ICD-300.00)  Her updated medication list for this problem includes:    Fluoxetine Hcl 20 Mg Caps (Fluoxetine hcl) .Marland Kitchen... 1po once daily did not tolerate the zoloft, for treat as above, f/u any worsening signs or symptoms ; if not able to tolerate would change to lexapro  Problem # 2:  DIABETES MELLITUS, TYPE II (ICD-250.00)  Her updated medication list for this problem includes:    Ecotrin 325 Mg Tbec (Aspirin) .Marland Kitchen... 1 by mouth once daily    Lisinopril 10 Mg Tabs (Lisinopril) .Marland Kitchen... 1 by mouth once daily  Labs Reviewed: Creat: 1.1 (08/26/2009)    Reviewed HgBA1c results: 6.4 (08/26/2009)  6.1 (03/21/2009) stable overall by hx and exam, ok to continue meds/tx as is   Problem # 3:  HYPERTENSION (ICD-401.9)  Her updated medication list for this problem includes:    Furosemide 40 Mg Tabs (Furosemide) .Marland Kitchen... Take 1  tablet by mouth once daily as needed swelling    Norvasc 10 Mg Tabs (Amlodipine besylate) .Marland Kitchen... 1 by mouth once daily    Clonidine Hcl 0.1 Mg Tabs (Clonidine hcl) .Marland Kitchen... 1 by mouth two times a day    Lisinopril 10 Mg Tabs (Lisinopril) .Marland Kitchen... 1 by mouth once daily  BP today: 112/70 Prior BP: 140/82 (09/08/2009)  Labs Reviewed: K+: 4.5 (08/26/2009) Creat: : 1.1 (08/26/2009)   Chol: 203 (08/26/2009)   HDL: 35.90 (08/26/2009)   LDL: 114 (07/07/2008)   TG: 243.0 (08/26/2009) stable overall by hx and exam, ok to continue meds/tx  as is   Complete Medication List: 1)  Furosemide 40 Mg Tabs (Furosemide) .... Take 1  tablet by mouth once daily as needed swelling 2)  Norvasc 10 Mg Tabs (Amlodipine besylate) .Marland Kitchen.. 1 by mouth once daily 3)  Levothroid 125 Mcg Tabs (Levothyroxine sodium) .Marland Kitchen.. 1po once daily 4)  Ecotrin 325 Mg Tbec (Aspirin) .Marland Kitchen.. 1 by mouth once daily 5)  Simvastatin 80 Mg Tabs (Simvastatin) .Marland Kitchen.. 1 by mouth 3 x  per wk 6)  Nystatin 100000 Unit/gm Powd (Nystatin) .... Use asd to affected area two times a day as needed 7)  Daily Multi Tabs (Multiple vitamins-minerals) .Marland Kitchen.. 1 by mouth once daily 8)  Fish Oil 1000 Mg Caps (Omega-3 fatty acids) .... 2 by mouth once daily 9)  Clonidine Hcl 0.1 Mg Tabs (Clonidine hcl) .Marland Kitchen.. 1 by mouth two times a day 10)  Fluoxetine Hcl 20 Mg Caps (Fluoxetine hcl) .Marland Kitchen.. 1po once daily 11)  Meclizine Hcl 12.5 Mg Tabs (Meclizine hcl) .Marland Kitchen.. 1 by mouth q 6 hrs as needed 12)  Omeprazole 20 Mg Cpdr (Omeprazole) .... 2 by mouth once daily 13)  B-100 Complex Tabs (Vitamins-lipotropics) .... Once daily 14)  Vitamin D 400 Unit Tabs (Cholecalciferol) .... Once daily 15)  Lisinopril 10 Mg Tabs (Lisinopril) .Marland Kitchen.. 1 by mouth once daily  Patient Instructions: 1)  Please take all new medications as prescribed   - the generic prozac 2)  Continue all previous medications as before this visit 3)  Please schedule a follow-up appointment as you have planned Prescriptions: FLUOXETINE HCL 20 MG CAPS (FLUOXETINE HCL) 1po once daily  #90 x 3   Entered and Authorized by:   Biagio Borg MD   Signed by:   Biagio Borg MD on 12/29/2009   Method used:   Print then Give to Patient   RxID:   [id number]    Orders Added: 1)  Est. Patient Level IV [id number]

## 2010-03-23 NOTE — Letter (Signed)
Summary: [facility]: Immunoassay Fecal Occult Blood (iFOB) Order Form  [facility]  [address] [place], [place] [address]   Phone: [phone]  Fax: [phone]      [facility]: Immunoassay Fecal Occult Blood (iFOB) Order Form   April 12, 2009 MRN: [id number]   Heather Dawson January 13, 1956   Physicican Name:Robert Kaplan,MD  Diagnosis Code:hematochezia      Genella Mech CMA ([place])

## 2010-03-23 NOTE — Progress Notes (Signed)
  Phone Note Refill Request Message from:  Fax from Pharmacy on November 18, 2009 1:53 PM  Refills Requested: Medication #1:  SIMVASTATIN 80 MG TABS 1 by mouth 3 x per wk   Dosage confirmed as above?Dosage Confirmed   Notes: [facility]. Initial call taken by: Robin Ewing CMA (AAMA),  November 18, 2009 1:54 PM    Prescriptions: SIMVASTATIN 80 MG TABS (SIMVASTATIN) 1 by mouth 3 x per wk  #39 x 3   Entered by:   Sharon Seller CMA ([place])   Authorized by:   Biagio Borg MD   Signed by:   Sharon Seller CMA ([place]) on 11/18/2009   Method used:   Faxed to ...       [id number] (retail)       [address]       [place], [place]  [address]       Ph: [phone]       Fax: [phone]   RxID:   [id number]

## 2010-05-16 ENCOUNTER — Other Ambulatory Visit: Payer: Self-pay

## 2010-05-16 MED ORDER — OMEPRAZOLE 20 MG PO CPDR: 20.0000 mg | DELAYED_RELEASE_CAPSULE | Freq: Two times a day (BID) | ORAL | Status: DC

## 2010-05-16 NOTE — Telephone Encounter (Signed)
[facility] [address]. Requesting refill on Omeprazole 20 mg. Last refill 03/21/09, last OV 12/29/09

## 2010-07-04 NOTE — Assessment & Plan Note (Signed)
[facility]                                 ON-CALL NOTE   JOSS, DEEG                         MRN:          [id number]  DATE:07/14/2006                            DOB:          [birth year]    PHONE NUMBER:  [id number].   PATIENT DOCTOR:  Dr. Jenny Reichmann.   She calls at 7:32 p.m. on May 25th.  She started having diarrhea  yesterday morning and nausea and vomiting.  Anything she eats, including  rice, creamed potatoes, water, goes right through her.  She has tried  Imodium, and that has not helped.  I did instruct her that Imodium was a  bad idea with infectious diarrhea for the future.  When I asked her  about pain, she said Oh yes, I am having terrible pain, all over my  abdomen and through to my back.   PLAN:  Once I heard about the severe pain she was describing, I had no  option but to tell her to go to the emergency room, so that is what I  did, and she was going to have someone bring her to the emergency room  for evaluation.  While this is probably just gastroenteritis, with the  severe pain, I have to worry about other possibilities like gastritis or  pancreatitis.     Venia Carbon, MD  Electronically Signed    RIL/MedQ  DD: 07/14/2006  DT: 07/14/2006  Job #: [id number]   cc:   Biagio Borg, MD

## 2010-07-04 NOTE — Assessment & Plan Note (Signed)
[facility]                         GASTROENTEROLOGY OFFICE NOTE   Heather Dawson, Heather Dawson                         MRN:          [id number]  DATE:01/20/2007                            DOB:          06-23-1955    PROBLEM:  Abdominal pain.   Heather Dawson has returned for reevaluation.  About a month ago, she  developed midepigastric postprandial pain.  She claims to have had a jet  black stool x1.  She was placed on Nexium 40 mg twice a day.  CBC at  that time was notable for a hemoglobin of 14.8.  She has had no  recurrences of black stool.  Despite Nexium, she is complaining of  persistent postprandial burning and midepigastric pain.  She also will  awaken in the early morning with abdominal bloating.  She also complains  of early satiety. She is on no gastric irritants including  nonsteroidals.   Other medications include amlodipine, clonidine, levothyroxine, Lasix.   PHYSICAL EXAMINATION:  VITAL SIGNS:  Pulse 76, blood pressure 130/88.  Weight 209.  HEENT: EOMI.  PERRLA.  Sclerae are anicteric.  Conjunctivae are pink.  NECK:  Supple without thyromegaly, adenopathy or carotid bruits.  CHEST:  Clear to auscultation and percussion without adventitious  sounds.  CARDIAC:  Regular rhythm; normal S1 S2.  There are no murmurs, gallops  or rubs.  ABDOMEN:  Bowel sounds are normoactive.  Abdomen is soft, nontender and  nondistended.  There are no abdominal masses, tenderness, splenic  enlargement or hepatomegaly.  EXTREMITIES:  Full range of motion.  No cyanosis, clubbing or edema.  RECTAL:  There are no masses.  Stool is heme negative.  Marland Kitchen   IMPRESSION:  Persistent dyspepsia with questionable history of a single  melenic stool.  There is currently no evidence for ongoing  gastrointestinal blood loss.  She may have ulcerative or nonulcerative  dyspepsia.   RECOMMENDATIONS:  1. Upper endoscopy.  2. Trial of Levbid 0.375 mg twice a day.     Sandy Salaam.  Deatra Ina, MD,FACG  Electronically Signed    RDK/MedQ  DD: 01/20/2007  DT: 01/20/2007  Job #: [id number]

## 2010-07-07 NOTE — Discharge Summary (Signed)
[place]  Patient:    Heather Dawson, Heather Dawson Visit Number: [id number] MRN: [id number]          Service Type: MED Location: [phone] Attending Physician:  Biagio Borg Dictated by:   [name] Seminole, P.A. Admit Date:  04/09/2001 Discharge Date: 04/10/2001                             Discharge Summary  DISCHARGE DIAGNOSES: 1. Myocardial infarction ruled out. 2. Chest wall pain. 3. Gastroesophageal reflux disease.  HISTORY OF PRESENT ILLNESS:  Ms. Freund is a 55 year old white female who developed back pain two days ago.  She presented with a substernal chest pain that seemed to radiate underneath both breasts.  She did have nausea and shortness of breath.  The patient was admitted to rule out myocardial infarction.  PAST MEDICAL HISTORY: 1. Hypertension. 2. Hypothyroidism. 3. Gastroesophageal reflux disease. 4. Obesity. 5. Hypercholesterolemia.  HOSPITAL COURSE:  CARDIOVASCULAR:  The patient presented with atypical chest pain.  Serial cardiac enzymes were negative, and EKG was negative for ischemia.  Myocardial infarction was ruled out.  The patient was empirically started on a proton pump inhibitor to treat for gastroesophageal reflux disease.  She was also started on Vioxx for a 7 day course to treat chest wall pain.  DISCHARGE MEDICATIONS: 1. Synthroid 0.125 mg q.d. 2. Atenolol 50 mg q.d. 3. Prilosec 20 mg q.d. 4. Vioxx 25 mg q.d. p.r.n. pain.  FOLLOWUP: 1. The patient is to follow up for a stress only Cardiology on Wednesday,    April 16, 2001, at 7:30 a.m. 2. Dr. Jenny Reichmann in 2 to 3 weeks. Dictated by:   [name] Seminole, P.A. Attending Physician:  Biagio Borg DD:  04/10/01 TD:  04/11/01 Job: [id number]

## 2010-07-07 NOTE — Discharge Summary (Signed)
NAME:  Heather Dawson, Heather Dawson NO.:  [id number]   MEDICAL RECORD NO.:  [id number]          PATIENT TYPE:  INP   LOCATION:  5522                         FACILITY:  [facility]   PHYSICIAN:  Gwendolyn Grant, M.D. LHCDATE OF BIRTH:  1955-12-08   DATE OF ADMISSION:  11/29/2004  DATE OF DISCHARGE:  12/14/2004                                 DISCHARGE SUMMARY   DISCHARGE DIAGNOSES:  1.  Acute renal failure secondary to Escherichia coli sepsis in a setting of      pyelonephritis with obstruction.  2.  Status post ventilatory dependent respiratory failure.  3.  Anemia.  4.  Varicella zoster.  5.  Urinary tract infection.  6.  Status post left ureteral stent placement per Dr. Rosana Hoes.   HISTORY OF PRESENT ILLNESS:  The patient is a 55 year old white female with  a history of obesity, hypertension and hypercholesterolemia who was brought  to the emergency room after being seen on the day prior in the emergency  room with complaints of abdominal pain.  The patient was noted to have a  fever of 103 and was brought by ambulance.  The patient was admitted for  further evaluation.   PAST MEDICAL HISTORY:  1.  Hypertension.  2.  Hypercholesterolemia.  3.  History of hypothyroidism.  4.  History of obesity.  5.  Seasonal allergies.   COURSE OF HOSPITALIZATION:  Problem 1.  Acute renal failure secondary to E.  coli with pyelonephritis and obstruction.  The patient was admitted and was  noted to have a left distal ureterolithiasis and underwent stent placement  which was performed by Dr. Tresa Endo on November 26, 2004.  The patient was  also noted to be hypotensive and in respiratory distress and ultimately was  intubated.  Respiratory failure and acute renal failure were secondary to  septic shock and severe sepsis with urine being source for infection.   A critical care consult was obtained and the patient was seen by Dr. Asencion Noble.  The patient was maintained on IV antibiotics  and was ultimately  extubated without any further complications.   The patient was started on hemodialysis which was continued for several  treatments and was ultimately discontinued secondary to improvement in  creatinine and improvement in urine output.  The patient did not have  urinary symptoms at any point during this hospitalization.  At this time,  the patient is stable off of hemodialysis, but will need extremely close  outpatient follow up of creatinine.  Baseline creatinine at discharge is  9.1.  A urine culture grew E. coli greater than 100,000 on admission.   Problem 2. Varicella zoster.  The patient was noted on this hospitalization  to develop zoster behind the left ear with several lesions on the left  cheek.  This resolved with antiviral medication and has healed.   DISCHARGE LABORATORY DATA:  BUN 51, creatinine 9.1, sodium 141, potassium  3.9, chloride 108.  Hemoglobin 11.8, hematocrit 35.   DISCHARGE MEDICATIONS:  1.  Protonix 40 mg p.o. daily.  2.  Synthroid 175 mcg p.o.  daily.  3.  Flonase two sprays each nostril daily.  4.  Calcium carbonate 1500 mg p.o. t.i.d.  5.  Norvasc 10 mg p.o. q.h.s.  6.  Lexapro 20 mg p.o. daily.   Prescription given for 30-day supple of Synthroid, Flonase and Norvasc.   FOLLOW UP PLANS:  The patient is to follow up with Dr. Marval Regal from  [place] in 10 days and call for an appointment.  In  addition, the patient is to call for Dr. [name] on October 30 at 9:30  a.m. for follow up appointment.  In addition, the patient is to follow up at  the __________ office for a blood draw tomorrow on December 15, 2004 and  every Monday, Wednesday, Friday for the next 2 weeks until discontinued by  the patient's primary care.   Follow up will also be needed with Clearnce Sorrel. Andres Labrum, FNP-C with urology  within the next 1 to 2 weeks.  The patient was instructed to call for an  appointment.      Melissa S. Inda Castle,  NP      Gwendolyn Grant, M.D. [facility]  Electronically Signed    MSO/MEDQ  D:  12/14/2004  T:  12/14/2004  Job:  [id number]   cc:   Biagio Borg, M.D. [facility]  [address]  [place] [address]

## 2010-07-07 NOTE — Consult Note (Signed)
NAME:  NELVIA, MEINEKE                  ACCOUNT NO.:  [id number]   MEDICAL RECORD NO.:  [id number]           PATIENT TYPE:   LOCATION:                               FACILITY:  [facility]   PHYSICIAN:  Bruce Lemmie Evens. Swords, M.D. [place] OF BIRTH:  1955/03/28   DATE OF CONSULTATION:  DATE OF DISCHARGE:                                   CONSULTATION   REASON FOR CONSULTATION:  Cough, hypertension, TIA, renal failure.   Dictation ended at this point.      Bruce Lemmie Evens Swords, M.D. [facility]     BHS/MEDQ  D:  12/22/2004  T:  12/22/2004  Job:  [id number]

## 2010-07-07 NOTE — H&P (Signed)
NAME:  Heather Dawson, LIMBERT                  ACCOUNT NO.:  [id number]   MEDICAL RECORD NO.:  [id number]          PATIENT TYPE:  AMB   LOCATION:  SDC                           FACILITY:  [facility]   PHYSICIAN:  Monia Sabal. Corinna Capra, M.D.    DATE OF BIRTH:  [birth year]   DATE OF ADMISSION:  03/20/2006  DATE OF DISCHARGE:                              HISTORY & PHYSICAL   HISTORY OF PRESENT ILLNESS:  Heather Dawson is a 55 year old G1, P1 with  worsening pelvic pain, anemia, abnormal uterine bleeding not responsive  to conservative medical management which has included a D&C in the past,  it has also included endometrial ablation.  Currently she is on three a  day birth control pills to control the bleeding and anemia.  Her  hemoglobin is finally back up to 13.9 after being on three a day of  Femcon.  She had a recent ultrasound that showed suspicion for  adenomyosis, also within this last year has had a D&C which was benign  but showed multiple endometrial polyps.  It did control the bleeding for  a period of time.  She presents today for definitive surgical  intervention, planned laparoscopic-assisted vaginal hysterectomy and  bilateral salpingo-oophorectomy.   PAST MEDICAL HISTORY:  Her past medical history is significant for  hypertension, hypothyroidism, and gastroesophageal reflux.  She does  have a history of kidney failure 1 or 2 years ago after having a kidney  stone but she has recovered from this.   PAST SURGICAL HISTORY:  Past surgical history is significant for  endometrial ablation, also laparoscopy for ovarian cyst and also a D&C  for abnormal bleeding.  She had her gallbladder removed.  She has had  cesarean sections.  She has also had surgery on her neck in the past.   MEDICATIONS:  Medications right now currently include Femcon taken 3  times a day.  She is on Norvasc, Clonidine, levothyroxine, Protonix and  Lasix.   ALLERGIES:  SHE HAS NO KNOWN DRUG ALLERGIES.   PHYSICAL EXAMINATION:  VITAL  SIGNS:  Her blood pressure is 124/70, her  weight is 214, height is 5'2.  HEART:  Regular rate and rhythm.  LUNGS:  Clear to auscultation bilaterally.  ABDOMEN:  Nondistended, nontender.  PELVIC:  Uterus is anteverted, mobile and nontender.  No adnexal masses  are palpable.  Good descensus is noted of the uterus, no appreciable  cystocele or rectocele is noted.   IMPRESSION AND PLAN:  Pelvic pain, anemia, abnormal uterine bleeding not  responsive to conservative medical management, currently on oral  contraceptive agent 3 times a day for control of bleeding and having  required a dilatation and curettage in the past as well as endometrial  ablation.  Plan is for laparoscopic-assisted vaginal hysterectomy with  bilateral salpingo-oophorectomy.  The risks and benefits of the  procedure were discussed at length which include but are not limited to  the risk of infection, bleeding, damage to the ureters, tubes, ovaries,  bowel, bladder.  Risks associated with anesthesia.  Risks associated  with blood transfusion.  I did discuss  the procedure at length, its  complications at length, the recovery period at length.  I also  described and discussed hormone replacement therapy.  She does wish to  proceed with the procedure and gives her informed consent.      Monia Sabal Corinna Capra, M.D.  Electronically Signed     DCL/MEDQ  D:  03/19/2006  T:  03/19/2006  Job:  [id number]

## 2010-07-07 NOTE — Op Note (Signed)
NAME:  Heather Dawson, Heather Dawson NO.:  [id number]   MEDICAL RECORD NO.:  [id number]          PATIENT TYPE:  INP   LOCATION:  [id number]                         FACILITY:  [facility]   PHYSICIAN:  Duane Lope. Rosana Hoes, M.D.  DATE OF BIRTH:  1955-08-05   DATE OF PROCEDURE:  11/26/2004  DATE OF DISCHARGE:                                 OPERATIVE REPORT   PREOPERATIVE DIAGNOSIS:  Left distal ureterolithiasis with urosepsis and  pyonephrosis.   POSTOPERATIVE DIAGNOSIS:  Left distal ureterolithiasis with urosepsis and  pyonephrosis.   OPERATION PERFORMED:  Cystourethroscopy, left retrograde ureteral pyelogram,  placement of double-J stent, left renal pelvic culture and sensitivity.   SURGEON:  Duane Lope. Rosana Hoes, M.D.   ASSISTANT:  Clearnce Sorrel. Andres Labrum, FNP-C   ANESTHESIA:  General endotracheal.   SPECIMENS:  Left renal pelvic urine for culture.   ESTIMATED BLOOD LOSS:  Negligible.   TUBES:  24 cm 6 [place] Cook double pigtail stent.   COMPLICATIONS:  None.   INDICATIONS FOR PROCEDURE:  Ms. Konzen is a lovely 55 year old white female  who presented to the emergency room with flank pain, nausea and vomiting and  fever.  She had been seen Friday in [facility] emergency room and  placed on Cipro and again developed worsening symptoms, nausea and vomiting.  While seen in the emergency room her white blood cell count was only in the  8 range; however, she developed worsening symptoms and fever to 101 degrees  F and was felt needed to be hospitalized and kidney needed to be drained.  CT scan revealed a 5 mm left distal ureteral calculus with hydronephrosis.  After understanding risks, benefits and alternatives she elected to proceed.  She has been given Cipro p.o. and IV and Rocephin IV.   DESCRIPTION OF PROCEDURE:  The patient was placed in supine position after  proper general endotracheal anesthesia, was placed in dorsal lithotomy  position, prepped and draped with Betadine in  sterile fashion.  Cystourethroscopy was performed, 22.5 French Olympus panendoscope, the  bladder was carefully inspected and noted to be without lesions.  Efflux of  clear urine was noted from the right ureteral orifice and the left ureteral  orifice appeared to be normal but very little urine was seen.  Under  fluoroscopic guidance, a 0.038 French Teflon coated guidewire was placed in  the left renal pelvis and pyonephrotic drip was noted.  A 6 French open  ended catheter was placed in the left renal pelvis and blood tinged urine  was obtained and sent for culture and sensitivity.  A small amount of dye  was injected in the left renal pelvis.  She was noted to have a very small  left renal pelvis and  no pressure was exerted. A 24 cm 6 [place] Cook double pigtail stent was  placed and noted to be in good position within the left renal pelvis and  within the bladder. Bloody efflux was noted through it.  The bladder was  drained.  The panendoscope was removed.  The patient was taken to the  recovery room stable.  [place] Rosana Hoes, M.D.  Electronically Signed     RLD/MEDQ  D:  11/26/2004  T:  11/27/2004  Job:  [id number]

## 2010-07-07 NOTE — H&P (Signed)
NAME:  Heather Dawson, HANSARD NO.:  [id number]   MEDICAL RECORD NO.:  [id number]          PATIENT TYPE:  INP   LOCATION:  [place]                         FACILITY:  [place]   PHYSICIAN:  Alvin C. Florene Glen, M.D.  DATE OF BIRTH:  [birth year]   DATE OF ADMISSION:  01/16/2005  DATE OF DISCHARGE:                                HISTORY & PHYSICAL   Ms. Menon is a 55 year old recently discharged from hospital after being  diagnosed with a pancreatic TIA.  Also the patient was earlier seen for a  visit during the month October for septic shock secondary to pyelonephritis  with acute renal failure, obstruction requiring left ureteral JJ stent.  The  patient had her stent removed today and labs were drawn which showed  creatinine 1.7.  It was 2.6 on September 13.  Complained of vomiting x1 the  day prior to admission.  Has been nauseated since then.  Also complains of  left sided back pain radiating to anterior side of left leg, and decreased  urine output over 3-4 days.  No complaint of fever.   PAST MEDICAL HISTORY:  1.  Ventilator-dependent respiratory failure secondary to sepsis in October      of 2006.  2.  Acute renal failure secondary to hypertension.  3.  Left ureteral stone obstruction.  4.  Escherichia coli pyelonephritis.  5.  Urologist, Dr. Rosana Hoes following. Ultrasound of the kidney shows      normostatic since.  6.  Hypothyroidism.  7.  History of C4-5, C5-6 anterior right diskectomy in 2005.  8.  History of cholecystectomy.  9.  History of bradycardia, patient put on atenolol.  10. History of GERD.  11. Obesity.  12. Mild mitral regurgitation, ejection fraction 50-55% as of October 2006.   MEDICATIONS:  1.  Levothyroxine 150 mcg daily.  2.  Cipro 500 mg b.i.d. for 8 days.  3.  Klonopin 0.1 b.i.d.  4.  Protonix 40 daily.  5.  Mucinex p.r.n.  6.  Docusate 6 daily.  7.  Labetalol 200 daily.  8.  HCTZ/Triamterene combination once daily.   ALLERGIES:  No known drug  allergies.   SOCIAL HISTORY:  Tobacco, alcohol.   FAMILY HISTORY:  Father with history of diabetes, hypertension, coronary  artery disease.  Siblings; history of hypertension, diabetes.   PHYSICAL EXAMINATION:  VITAL SIGNS:  Temperature 99.1, pulse 92, respiratory  rate 16, O2 90% on room air.  Blood pressure 124/85.  GENERAL:  In no acute distress.  HEENT:  Eyes; pupils equal, round, and reactive to light.  Extraocular  movements intact.  ENT; Dry mucous membranes in oropharynx.  LUNGS:  Clear to auscultation bilaterally.  CARDIOVASCULAR:  Regular rate and rhythm.  ABDOMEN:  Soft.  Suprapubic tenderness, tenderness present in the left upper  quadrant.  Notice some improvement in this.  EXTREMITIES:  Trace edema, +2 pulses.   LABORATORY DATA:  White count 12.8, __________  15.3, hemoglobin 11.8,  platelets 394.  Sodium 134, potassium 3.3, chloride 104, __________ 58,  bicarb 25, creatinine 1.7, glucose 145, albumin 3.3, calcium 8.8,  phosphorus  7.7.   ASSESSMENT:  1.  Acute renal failure.  Check ultrasound of kidneys to rule out      obstruction since __________  today.  Also the patient could have      clogged stent which could have resulted into this.  The patient could be      volume depleted secondary to nausea, vomiting, and diuretic use.      Chances of infection are high secondary to stent in situ and      intervention today.  We will hold onto antibiotics recently.  Sent a      urinalysis, urine micro, and urine culture.  The patient is afebrile and      will follow from here.  2.  Leukocytosis.  May be secondary to urinary tract infection.  3.  Hypokalemia.  Vomiting post diuretic use.  Replete.  4.  Hypertension, stable apparently.  No medicines.  5.  Hypothyroidism.  Levothyroxine 150 mcg daily.      Caprice Kluver, MD      Darrold Span. Florene Glen, M.D.  Electronically Signed    SP/MEDQ  D:  01/16/2005  T:  01/16/2005  Job:  [id number]

## 2010-07-07 NOTE — Op Note (Signed)
NAME:  Heather Dawson, Heather Dawson                  ACCOUNT NO.:  [id number]   MEDICAL RECORD NO.:  [id number]          PATIENT TYPE:  INP   LOCATION:  9319                          FACILITY:  [facility]   PHYSICIAN:  Monia Sabal. Corinna Capra, M.D.    DATE OF BIRTH:  May 31, 1955   DATE OF PROCEDURE:  06/07/2005  DATE OF DISCHARGE:  06/08/2005                                 OPERATIVE REPORT   PREOPERATIVE DIAGNOSIS:  Abnormal uterine bleeding, endometrial masses on  saline infusion ultrasound, pelvic pain, left ovarian cyst, not response to  conservative medical management.   POSTOPERATIVE DIAGNOSES:  1.  Abnormal uterine bleeding, endometrial masses on saline infusion      ultrasound, pelvic pain, left ovarian cyst, not response to conservative      medical management.  2.  Pelvic adhesions.   PROCEDURE:  Hysteroscopy, with dilation and curettage, operative  laparoscopy, with lysis of adhesions, and left salpingo-oophorectomy.   SURGEON:  Monia Sabal. Corinna Capra, M.D.   ANESTHESIA:  General endotracheal.   INDICATIONS:  Ms. Thresher is a 55 year old G1 P1 with worsening pelvic pain.  She has been evaluated by a urologist as well as a GI doctor this week.  She  has been out of work because of the pain. S he has also been having abnormal  bleeding.  On seeing me today, underwent ultrasound which shows a large left  ovarian cyst, 6.5 cm in nature, with a hemorrhagic component.  She also has  multiple endometrial polypoid appearing masses within the endometrial cavity  on saline infusion ultrasound.  She desires hysteroscopy, D&C, and also  removal of left tube and ovary by laparoscopy and any indicated procedures  deemed necessary to help with her pain.  The risks and benefits of the  procedure were discussed at length, which include but are not limited to  risks of infection, bleeding, damage to the bowel, bladder, ureters,  ovaries, the possibility that this may not alleviate the pain or the  bleeding, it may recur or require  further surgery in the future.  Risks  associated with anesthesia and risks associated with blood transfusion were  also discussed.  She gives her informed consent.   FINDINGS AT THE TIME OF THE SURGERY:  Hysteroscopy reveals multiple polypoid  type of structures throughout the endometrial cavity.  Otherwise, normal-  appearing cavity after D&C.  Exception is it did not appear like a normal  endometrium, more of raggedy walls consistent with her history of NovaSure  endometrial ablation.  On laparoscopy, we have a normal-appearing liver,  appendix.  Left ovary is approximately 6.5 cm, consistent with ultrasound.  However, it appears to be more serous in nature.  It is densely adhered to  the pelvic sidewall and bowel and uterus.  The uterus is also adherent to  the anterior abdominal wall.   DESCRIPTION OF PROCEDURE:  After adequate analgesia, the patient was placed  in the dorsal lithotomy position.  She was sterilely pad .  The bladder was  sterilely drained.  A Graves speculum was placed.  Tenaculum was placed on  the  anterior lip of the cervix.  The uterus was sounded to 8 cm, dilated up  to a #25 Pratt dilator.  Hysteroscope was inserted.  The above findings were  noted.  Curettage was performed, and also polyp forceps were used to grasp  and remove the intrauterine contents which were consistent with polyps.  After not retrieving any more polyps, hysteroscope revealed a normal-  appearing cavity at this time, except it did not appear like a normal  endometrium.  It did appear to have more of a rough surface, consistent with  a previous history of an endometrial ablation.  Sorbitol deficit was 50 cc.  A Cohen tenaculum was placed.  Legs were repositioned.  Infraumbilical skin  incision was made.  A Veress needle was inserted.  The abdomen was  insufflated.  Dullness to percussion.  An 11 mm trocar was inserted.  Laparoscope was inserted, and the above findings were noted.  A 5 mm  trocar  was inserted to the left of the midline, two fingerbreadths above the pubic  symphysis.  Lysis of adhesions was carried out, taking down the scar tissue  from the anterior abdominal wall to the uterus.  The left adnexa was  grasped, and adhesions from the omentum and the bowel were dissected off the  left tubo-ovarian ligament.  The serous fluid was drained, and the  uteroovarian ligament was identified, grasped with Delfina Redwood cutting forceps,  ligated, and dissected across the uteroovarian ligament down to the pelvic  sidewall.  It was dissected off the pelvic sidewall and off of the  infundibulopelvic ligament which was ligated and dissected.  Care was taken  to avoid the underlying wall.  However, because of the extensive dissection  in this area, I was unable to identify the ureter below the peritoneum.  However, care was taken to stay along the plane of the ovary.  After a  copious amount of irrigation, adequate hemostasis was ensured.  The ovary  was removed through the umbilical trocar.  The 5 mm trocar and the 11 mm  trocars were removed.  The infraumbilical skin incision was closed with a  UR0 Vicryl in an interrupted fashion, in the fascia, 3-0 Vicryl Rapide  subcutaneous suture.  The 5 mm site was closed with 3-0 Vicryl Rapide  subcuticular suture.  The tenaculum was removed from the cervix, noted to be  hemostatic.  The incisions were injected with 0.25% Marcaine, a total of 10  cc used.  The patient tolerated the procedure well and was stable and  transferred to the respiratory rate.  Sponge, needle, and instrument counts  were correct x3.  Estimated blood loss was minimal.  The patient did receive  1 g of Rocephin preoperatively.  Because of the extensive dissection in the  left adnexa and unable to identify the ureter, I am planning on proceeding  with an IVP before sending the patient to the floor.  DISPOSITION:  Will have the patient stay as an outpatient overnight  and send  her home in the morning.      Monia Sabal Corinna Capra, M.D.  Electronically Signed     DCL/MEDQ  D:  06/07/2005  T:  06/08/2005  Job:  [id number]

## 2010-07-07 NOTE — Discharge Summary (Signed)
NAME:  Heather Dawson, Heather Dawson                  ACCOUNT NO.:  [id number]   MEDICAL RECORD NO.:  [id number]          PATIENT TYPE:  OIB   LOCATION:  9304                          FACILITY:  [facility]   PHYSICIAN:  Monia Sabal. Corinna Capra, M.D.    DATE OF BIRTH:  1955/04/14   DATE OF ADMISSION:  03/20/2006  DATE OF DISCHARGE:  03/21/2006                               DISCHARGE SUMMARY   HISTORY OF PRESENT ILLNESS:  Heather Dawson is a 55 year old G1, P1 with  worsening pelvic pain, anemia, abnormal uterine bleeding which has not  been responsive to conservative medical management which has included  D&C and also endometrial ablation.  Currently, she is on 3 times a day  birth control pills for bleeding and anemia.  Her blood counts with iron  therapy and hormonal therapy has increased to 30.9.  Recent ultrasound  shows suspicion for adenomyosis.  She presents today for definitive  surgical intervention and plans for laparoscopic assisted vaginal  hysterectomy with the removal of right tube and ovary.  She has had a  previous left salpingo-oophorectomy.   HOSPITAL COURSE:  The patient underwent a laparoscopically assisted  vaginal hysterectomy with right salpingo-oophorectomy.  The surgery was  uncomplicated.  Estimated blood loss was 500 mL.  Her postoperative care  was unremarkable.  She had good return of bowel function and she was  ambulating and tolerating a regular diet.  On postoperative day #1 her  postoperative hemoglobin was 11.0, had normoactive bowel sounds.  The  incisions were clean, dry and intact.  The patient was discharged home.  She was begun on estradiol at 0.1 mg a day by patch and tolerated this  well.   DISPOSITION:  The patient was discharged to home.  Follow-up in the  office in one week.  Routine instructions for hysterectomy.  She is told  to return  for increased pain, fever or bleeding. She is given a  prescription for Vicodin #30 and estradiol patches at 0.1 mg per day.      Monia Sabal  Corinna Capra, M.D.  Electronically Signed     DCL/MEDQ  D:  03/21/2006  T:  03/21/2006  Job:  [id number]

## 2010-07-07 NOTE — H&P (Signed)
NAME:  Heather Dawson, Heather Dawson                            ACCOUNT NO.:  [id number]   MEDICAL RECORD NO.:  [id number]                   PATIENT TYPE:  INP   LOCATION:  5526                                 FACILITY:  [facility]   PHYSICIAN:  Loretha Brasil. Lia Foyer, M.D. [facility]         DATE OF BIRTH:  November 09, 1955   DATE OF ADMISSION:  01/30/2002  DATE OF DISCHARGE:                                HISTORY & PHYSICAL   CHIEF COMPLAINT:  Epigastric pain.   HISTORY OF PRESENT ILLNESS:  The patient is a 55 year old white female.  She  has a history of hypertension and hypercholesterolemia.  She noticed  yesterday onset of epigastric discomfort.  The discomfort was described as a  clenched fist going into her back that lasted up to four hours.  There was  no arm pain, shortness of breath, or diaphoresis.  She has had a recent  workup by Dr. Erskine Emery with apparently a MRI of the abdomen which per  the patient showed evidence of gallstones.  She had an endoscopy which was  apparently negative.  Earlier in the year, she had a stress Cardiolite which  was unremarkable according to the patient.  She was told that it was normal.  This episode occurred yesterday after eating chocolate covered peanuts.  She  is now pain-free.  Because of the severity of her symptoms, Dr. Excell Seltzer  appropriately feels that the patient needs to have a cholecystectomy.  Workup in the emergency room has apparently revealed gallstones by  ultrasound.  While upstairs, it is important that the patient has had a slow  pulse with bigeminal rhythm.  Two weeks ago, she had an episode where she  got up and she passed out, and her husband found her on the floor.   PAST MEDICAL HISTORY:  1. Prior cesarean section.  2. Treated hypothyroid.  3. Recent herpes zoster, left forehead.  4. History of hypertension, on atenolol.   ALLERGIES:  No known drug allergies.   FAMILY HISTORY:  Father has diabetes.  She has a son with diabetes.  Mother  died of  congestive heart failure in her 47's.   MEDICATIONS:  1. Atenolol which she believes the dose is 50 mg q.d.  2. Synthroid 0.125 mg q.d.   REVIEW OF SYMPTOMS:  Remarkable for a history suggesting gastroesophageal  reflux.  She has lost weight with [employer] recently.   SOCIAL HISTORY:  The patient is married.  She is an Agricultural consultant.  She  is a nonsmoker.   PHYSICAL EXAMINATION:  GENERAL:  She is an alert and oriented female in no  acute distress.  VITAL SIGNS:  Blood pressure was 114/65, temperature 97.2, heart rate 81,  respiratory rate 20 on arrival.  Her height was 5 feet 2 inches, weight  176.8 pounds.  NECK:  The jugular veins were not distended.  The carotid upstrokes were  brisk without significant bruits.  LUNGS:  Lung fields were clear to auscultation and percussion.  She does  have a 1 to 2/6 systolic ejection murmur on examination.  There is no  diastolic murmur.  There is no S4.  The murmur is located in the left  ventricular outflow tract region with mild transmission to the upper sternal  edge bilaterally.  ABDOMEN:  Soft with slight epigastric tenderness.  EXTREMITIES:  No significant edema.  NEUROLOGIC:  Nonfocal.   LABORATORY DATA:  White blood cell count 10,600, hemoglobin 15.1, hematocrit  43.0, platelet count 220,000.  Protein 6.8, albumin 3.9, SGOT and SGPT were  both significantly elevated.  The alkaline phosphatase and bilirubin were  normal.  CK total was 41, MB 1.0, troponin-I was 0.02.   IMPRESSION:  1. Epigastric discomfort with positive transaminases and imaging evidence of     cholelithiasis.  2. Bradycardia with recent episode of syncope.  3. Use of atenolol for hypertension and history of premature ventricular     complexes.  4. Systolic ejection murmur on examination.  5. Prior Cardiolite.   PLAN:  1. Withhold atenolol at the present time.  2. I have discussed her case with Dr. Excell Seltzer who feels that early     treatment is  warranted, and he will not discharge her until they go ahead     with cholecystectomy which will be a laparoscopic procedure more then     likely.  3. We will follow the patient with you while she is hospitalized.  4. We will get a general medical consult as they have managed her     hypertension and previously managed her situation.  5. An echocardiogram will be recommended to best assess her murmur.  6. On Monday, we will need to get access to the [facility] records which are     currently unavailable.  7. She will need to have an alternative medication for her hypertension.                                               Loretha Brasil. Lia Foyer, M.D. [facility]    TDS/MEDQ  D:  01/31/2002  T:  02/01/2002  Job:  [id number]   cc:   Biagio Borg, M.D. [facility] T. Hoxworth, M.D.  [address], [address] [place]  [place] [address]  Fax: [phone]

## 2010-07-07 NOTE — H&P (Signed)
NAME:  Heather Dawson, GRANDPRE                  ACCOUNT NO.:  [id number]   MEDICAL RECORD NO.:  [id number]          PATIENT TYPE:  AMB   LOCATION:  [facility]                          FACILITY:  [facility]   PHYSICIAN:  Monia Sabal. Corinna Capra, M.D.    DATE OF BIRTH:  1955/08/06   DATE OF ADMISSION:  06/07/2005  DATE OF DISCHARGE:                                HISTORY & PHYSICAL   HISTORY OF PRESENT ILLNESS:  Heather Dawson is a 55 year old G22, P1 with twins who  presented to my office today for evaluation of abnormal uterine bleeding and  worsening abdominal pelvic pain.  She earlier this week went to her  urologist because she felt there was a recurrence of stone or kidney  blockage.  They did a full CT and full workup and determined it was not  urological in nature.  She subsequently went to her GI doctor yesterday, who  did upper endoscopy and told her it was not GI related.  Presented to me  today for bleeding and pelvic pain.  Previously had a hysteroscopy and D&C  last year, including NovaSure endometrial ablation.  Had seen Dr. Nori Riis two  weeks ago for abnormal bleeding.  He placed her on low dose birth control  pill.  It did stop the bleeding, but she began bleeding yesterday, and after  they had passed a clot today, now she is complaining of severe cramping and  abdominopelvic pain.  She denies any fever, chills, nausea, vomiting,  diarrhea, constipation; however, she does have a loss of appetite due to the  pain.   Ultrasound evaluation in the office shows saline infusion, and ultrasound  shows multiple endometrial masses, consistent with polyps within the uterus.  She also has a large 6.5 cm hemorrhagic-appearing left ovarian cyst.  No  fluid in the cul de sac.  Normal-appearing right ovary.  She does have  fibroids in the uterus.  Because of the degree of pelvic discomfort, the  patient desired immediate and definitive surgical intervention.  Plan  hysteroscopy and D&C with laparoscopy with left  salpingo-oophorectomy.   Laboratory evaluation shows essentially normal comprehensive metabolic  package.  Creatinine is slightly elevated at 2.  Glucose is 132.  The BUN is  24.  Her white count is 15 with a hemoglobin of 14.6 and platelets of 307.   PHYSICAL EXAMINATION:  VITAL SIGNS:  Blood pressure 124/82.  HEART:  Regular rate and rhythm.  LUNGS:  Clear to auscultation bilaterally.  ABDOMEN:  Generalized discomfort with a predilection towards the mid lower  abdomen.  No rebound or guarding.  No flank pain is noted on pelvic exam.  She does have an anteverted, mobile uterus, tender with predilection towards  the left adnexa.  No rebound is noted.  A small amount of bleeding is noted  as well.   IMPRESSION/PLAN:  1.  Abnormal uterine bleeding, endometrial masses on ultrasound:      Hysteroscopy, D&C for evaluation and removal of these polyps.  2.  Pelvic pain, left ovarian cyst:  Not responsive to conservative medical  management.  Patient desires definitive evaluation today.  She has been      missing work and wants to proceed tonight on an urgent basis.  Discussed      the risks and benefits of laparoscopy at length, including but not      limited to risks of infection, bleeding, damage to      the bowel, bladder, ureters, ovaries, the possibility we may not be able      to alleviate the pain, it could recur and could require further surgery      in the future.  Plan removal of the left tube and ovary.  The risks and      benefits of all of these procedures were discussed at length.  Informed      consent was obtained.      Monia Sabal Corinna Capra, M.D.  Electronically Signed     DCL/MEDQ  D:  06/07/2005  T:  06/07/2005  Job:  [id number]

## 2010-07-07 NOTE — Op Note (Signed)
NAME:  Heather Dawson, KEPLAR NO.:  [id number]   MEDICAL RECORD NO.:  [id number]          PATIENT TYPE:  OIB   LOCATION:  3006                         FACILITY:  [facility]   PHYSICIAN:  Cooper Render. Pool, M.D.    DATE OF BIRTH:  1955-12-15   DATE OF PROCEDURE:  11/12/2003  DATE OF DISCHARGE:  11/13/2003                                 OPERATIVE REPORT   PREOPERATIVE. 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   PROCEDURE:  C4-5 and C5-6 anterior cervical discectomy and fusion with  Allograft anterior plating.   SURGEON:  Cooper Render. Pool, M.D.   ASSISTANT:  Faythe Ghee, M.D.   ANESTHESIA:  General endotracheal anesthesia.   INDICATIONS FOR PROCEDURE:  Heather Dawson is a 55 year old female with a history  of neck pain and bilateral upper extremity symptoms consistent with early  cervical myelopathy.  Work-ups demonstrated significant disc herniations at  C4-5 and C5-6 with spinal cord compression.  The patient had been counseled  as to her options.  She decided to proceed with a C4-5 and C5-6 anterior  cervical discectomy and fusion with allograft anterior plating.   DESCRIPTION OF PROCEDURE:  The patient taken to the operating room and  placed on the table in supine position.  After adequate level of anesthesia  achieved, the patient supine with neck slightly extended and placed in  halter traction.  Anterior cervical region was prepped and draped sterilely.  A #10 blade was used to make linear skin incision overlying the C5 vertebral  level.  This was carried down sharply to the platysma.  Platysma was then  divided vertically and dissection proceeded along the medial border of the  sternocleidomastoid muscle and carotid sheath.  Trachea and esophagus were  mobilized and retracted toward the left.  Prevertebral fascia was stripped  off the anterior spinal  column.  Longus colli muscle was then elevated  bilaterally using electrocautery.  Deep self-retaining retractor was placed.  Intraoperative fluoroscopy was used and the C4-5 and C5-6 levels were  confirmed.  Disc space was then incised with #15 blade in a similar fashion  at both levels.  Disc material was then removed using pituitary rongeurs  forward and backward and Kerrison rongeurs with a high speed drill.  All  elements of the disc were removed down to the posterior annulus.  Microscope  was brought into the field using microdissection  through the remaining  discectomy.  Starting first at C4-5, the high speed drill was used to remove  the remaining aspects of the annulus and osteophytes down to the level of  the posterior longitudinal ligament.  The posterior longitudinal ligament  was then elevated and resected in piecemeal fashion using Kerrison rongeurs.  Underlying thecal sac was identified.  A wide central decompression was then  performed by undercutting the bodies of C4 and C5.  Decompression then  proceeded out to each neural foramen.  Wide foraminotomy was then performed  along the course of  the exiting C5 nerve root.  The procedure was then  repeated at the C5-6 level again without complications.  A moderately large  free disc herniation was encountered at the C5-6 level and completely  resected.  Wound was then irrigated with antibiotic solution.  Gelfoam was  placed for operative hemostasis. A 7 mm patellar wedge allograft was then  packed into place and __________ approximately 1 mm from the anterior  critical margin at both C4-5 and C5-6.  A 37 mm Atlantis anterior cervical  plate was then placed over the C4, C5 and C6 levels. This was then attached  under fluoroscopic guidance using 13 mm variable angle screws. All six  screws given a final tightening and found to be solid in bone.  Locking  screws engaged in all three levels.  Final images revealed good position of   bone grafts and hardware at all graft levels of the spine.  Wound was then  inspected for hemostasis and found to be good.  It was then irrigated and  closed in typical fashion.  Steri-Strips and sterile dressing were applied.  There were complications.  The patient tolerated the procedure well and she  returns to the recovery room in satisfactory condition.       HAP/MEDQ  D:  11/12/2003  T:  11/13/2003  Job:  [id number]

## 2010-07-07 NOTE — Consult Note (Signed)
NAME:  Heather Dawson, Heather Dawson                  ACCOUNT NO.:  [id number]   MEDICAL RECORD NO.:  [id number]          PATIENT TYPE:  INP   LOCATION:  [id number]                         [facility]ACILITY:  [facility]   PHYSICIAN:  Corinna L. Conley Canal, MDDATE OF BIRTH:  Sep 30, 1955   DATE OF CONSULTATION:  DATE OF DISCHARGE:                                   CONSULTATION   REASON FOR CONSULTATION:  Hypotension/septic shock, history of hypertension  and hypothyroidism.   IMPRESSION AND PLAN:  1.  Hypotension/sepsis.  I agree with your choice of Cipro and Rocephin      pending blood and urine cultures.  I agree with monitoring in the      intensive care unit.  She will need vigorous fluid resuscitation.  I      will change her IV fluids to normal saline at 200 an hour if her blood      pressure remains low we may need to start pressors.  However, at this      time, this is not necessary.  2.  Left obstructing stone with urosepsis, status post double J stent today.  3.  Hypertension: Of course, her lisinopril should be held.  4.  Hypothyroidism.  I will check a TSH and continue her home dose of      Synthroid.  5.  History of C4-5 and C5-6 anterior cervical diskectomy in 2005.  6.  History of cholecystectomy.  7.  History of hyperlipidemia per old chart, patient is not on medications      at this time.  8.  History of bradycardia with syncope on atenolol.  9.  History of gastroesophageal reflux disease per old chart, patient is not      on any medications for this.  10. Obesity.  11. Mild mitral valvular regurgitation by echocardiogram in 2003 with      preserved ejection fraction.   HISTORY OF PRESENT ILLNESS:  Heather Dawson is a 55 year old white female who was  taken to the operating room earlier today for urosepsis and a left  obstructing ureteral stone.  She has received approximately three liters of  fluid since being in the operating room.  Her pressure is 86/36 at this time  and her heart rate is 107. I am asked  to assist with management of her  hypotension/sepsis as well as her history of hypertension and  hypothyroidism.  Patient has been given Rocephin and ciprofloxacin.  Apparently, she began having flank pain in the middle of the night, Friday  night.  She came to the emergency room yesterday and apparently was  discharged to home.  She returned today and was admitted and treated by Dr.  Rosana Hoes. Her primary care physician is Dr. [name].  Dr. Rosana Hoes asked that I  consult rather than whoever is on call for Dr. Jenny Reichmann.   PAST MEDICAL HISTORY:  As above.   CURRENT MEDICATIONS:  1.  Lisinopril 20 mg p.o. daily.  2.  Synthroid 150 mcg p.o. daily.  3.  Lexapro 20 mg p.o. daily.  4.  Allegra D one tablet p.o. b.i.d.  ADMISSION MEDICATIONS:  1.  Rocephin per pharmacy.  2.  Ciprofloxacin 400 mg IV q.12h.   ALLERGIES:  No known drug allergies.   SOCIAL HISTORY:  Patient denies drinking, smoking or drugs.   FAMILY HISTORY:  Noncontributory.   REVIEW OF SYSTEMS:  As above, otherwise negative. Please note that patient  is somewhat groggy and may not be terribly forthcoming with review of  systems.   PHYSICAL EXAMINATION:  VITAL SIGNS: Blood pressure currently is 86/34, heart  rate 112, respiratory rate is 20, oxygen saturation is 92% on oxygen by  facemask.  GENERAL: Patient is groggy but easily arousable. She is oriented x3.  HEENT: Pupils equal round and reactive to light.  Sclera nonicteric.  She  has dry mucous membranes and some dried blood about the lips.  Neck is thick  and supple, no thyromegaly.  LUNGS: Clear to auscultation bilaterally without wheezes, rhonchi or rales.  CARDIOVASCULAR: She is tachycardic, regular.  ABDOMEN: Obese, soft, nontender, nondistended.  GU: She has a Foley catheter draining bloody urine.  There is only about 20  mL in the bag at this time.  NEUROLOGIC: Patient is groggy but oriented.  Cranial nerves and sensory  motor exam are intact.  PSYCHIATRIC:  Patient is calm and cooperative.  SKIN: No rash.   LABORATORY DATA:  White blood count is 8.1 with greater than 123456 bands,  basophilic stippling and polychromasia.  Complete metabolic panel is  significant for a glucose of 131, creatinine of 1.3, albumin of 3.0, total  protein of 5.5, otherwise unremarkable.  Urinalysis shows cloudy urine,  specific gravity of 1.026, negative glucose, large blood, negative ketones,  negative protein, negative nitrites, moderate leukocyte esterase, 7-10 white  cells, few bacteria, 0-2 red cells.  CT of the abdomen and pelvis done  yesterday shows mild left hydronephrosis without any upper urinary tract  calculi but on CT of the pelvis showed an unchanged 5 mm calculus at the  left ureterovesical junction, status post bilateral tubal ligation,  bilateral ovarian cyst.  EKG showed sinus tachycardia with the rate of 117.   ASSESSMENT AND PLAN:  As above, in addition, I will give ulcer prophylaxis  and deep venous thrombosis prophylaxis.  We will monitor electrolytes and  renal function.   total critical care time is 60 minutes      Corinna L. Conley Canal, MD  Electronically Signed     CLS/MEDQ  D:  11/26/2004  T:  11/27/2004  Job:  [id number]

## 2010-07-07 NOTE — Consult Note (Signed)
NAME:  Heather Dawson, Heather Dawson                  ACCOUNT NO.:  [id number]   MEDICAL RECORD NO.:  [id number]          PATIENT TYPE:  INP   LOCATION:  1828                         FACILITY:  [facility]   PHYSICIAN:  Bruce Lemmie Evens. Swords, M.D. [place] OF BIRTH:  1956-01-21   DATE OF CONSULTATION:  12/22/2004  DATE OF DISCHARGE:                                   CONSULTATION   REFERRING PHYSICIAN:  Barnetta Chapel A. Jacolyn Reedy, M.D.   REASON FOR CONSULTATION:  Renal failure, cough, hypertension.   Ms. Suddeth is a 55 year old female who was recently hospitalized for acute  renal failure secondary to pyelonephritis, E. coli sepsis, and urinary  obstruction requiring a left ureteral stent.  She developed ventilator-  dependent respiratory failure and required intubation and critical care  management including Xigris therapy. She was discharged on October 26 and  has had followup since that time, apparently saw Dr. __________ recently who  added labetalol 200 mg twice daily to her usual medications.  Approximately  4 to 5 hours after taking her first labetalol, Ms. Balboa noted that her cough  got much worse.  (Note: She has had a cough for several weeks, previously  treated with Mucinex DM with good results.)  After taking the first  labetalol, she developed a cough, had a coughing paroxysm, went to the  bathroom to urinate, got hot at that time.  Shortly after that, she felt the  left side of her body go weak.  She had to drag her left foot and could not  move her left arm.  She was transported to the emergency department code  stroke, and has been admitted by Dr. Jacolyn Reedy.   PAST MEDICAL HISTORY:  Listed above:  1.  Acute renal failure.  2.  E. coli sepsis.  3.  Pyelonephritis, requiring left ureteral stent.  4.  Ventilator-dependent respiratory failure.  5.  History of hypothyroidism.  6.  Obesity.  7.  Hypertension.  8.  Hyperlipidemia.   CURRENT MEDICATIONS:  1.  Protonix 40 mg p.o. daily.  2.  Labetalol 200 mg  p.o. twice daily (first dose today).  3.  Norvasc 10 mg p.o. daily.  4.  Synthroid 150 mcg p.o. daily.  5.  Mucinex DM p.o. twice daily.   ALLERGIES:  No known drug allergies.   SOCIAL HISTORY:  She is married.  Remote tobacco use.   FAMILY HISTORY:  Father with diabetes.  Mother deceased with congestive  heart failure.   REVIEW OF SYSTEMS:  Denies chest pain, shortness of breath, PND, wheezing,  abdominal pain, change in bowel habits, change in urinary habits, lower  extremity edema.  She denies any other complaints in the Review of Systems  and feels that her motor strength on the left side of her body is back to  normal.   PHYSICAL EXAMINATION:  VITAL SIGNS: Temperature 97.9, respirations 24, heart  rate 79, blood pressure 116/71.  At the time of my examination, her  respirations are 14.  GENERAL:  She appears as an overweight female in no acute distress.  HEENT:  Atraumatic and normocephalic.  Extraocular muscles intact.  NECK:  Supple without lymphadenopathy, thyromegaly, jugular venous  distention, or carotid bruits.  CHEST: Clear to auscultation without increased work of breath.  CARDIAC: S1 and S2 normal without murmurs or gallops.  ABDOMEN:  Active bowel sounds, soft, nontender.  There is no  hepatosplenomegaly.  EXTREMITIES:  There is no clubbing, cyanosis, or edema.  NEUROLOGIC:  She is alert and oriented.  No sensory deficits.  Strength is  normal bilaterally.  Rapid alternating motions seem somewhat diminished on  the left.   LABORATORY DATA:  CBC is normal.  Platelet count 218,000, white count 7.4,  hemoglobin 13.3.  Creatinine 4.9, sodium 136, potassium 4, glucose 103, BUN  37.  APTT 27 seconds.   CT of the head: Normal.   ASSESSMENT AND PLAN:  1.  Hypertension, currently well controlled.  Apparently on review of      nephrology notes, blood pressure has been elevated which is why      labetalol was stopped.  Current blood pressure is well controlled.  I       suspect she will need another antihypertensive medication, but we will      hold on that for right now.  We will continue Norvasc.  We will hole      labetalol and not restart that.  If another blood pressure medicine is      necessary, we will consider clonidine.  2.  Renal failure, improving.  3.  Hypothyroidism.  Continue current medications.  4.  Transient ischemic attack. The patient was admitted by Dr. Jacolyn Reedy, and      she will follow that.      Bruce Lemmie Evens Swords, M.D. [facility]  Electronically Signed     BHS/MEDQ  D:  12/22/2004  T:  12/22/2004  Job:  [id number]   cc:   Barnetta Chapel A. Jacolyn Reedy, M.D.  Fax: [phone]

## 2010-07-07 NOTE — Consult Note (Signed)
NAME:  Heather Dawson, Heather Dawson NO.:  [id number]   MEDICAL RECORD NO.:  [id number]          PATIENT TYPE:  INP   LOCATION:  [id number]                         [facility]ILITY:  [facility]   PHYSICIAN:  Asencion Noble, M.D. LHCDATE OF BIRTH:  1955/03/21   DATE OF CONSULTATION:  11/26/2004  DATE OF DISCHARGE:                                   CONSULTATION   CHIEF COMPLAINT:  Septic shock.   HISTORY OF PRESENT ILLNESS:  This is a 55 year old white female, obese, with  history of hypertension, hypercholesterolemia, was brought into the  emergency room today after being seen yesterday in the emergency room for  complaints of pain. She was brought in by ambulance, found to have a fever  of 103, had abdominal complaints and abdominal pain, has had pyuria. Was  seen by the emergency room yesterday, thought to have a urinary tract  infection, treated with a course of antibiotics which she has not yet  completed. Symptoms have persisted and she was found to have renal stones  with obstruction of the left ureter. She was brought to the operating room  to have a stent placed per Dr. Rosana Hoes. Postoperatively, the patient had  increasing respiratory distress with hypoxemia and low blood pressure. On  this basis she was brought to the ICU and we were asked to assist in her  medical ICU care.   PAST MEDICAL HISTORY:  Medical:  History of hypertension, increased  cholesterol, history of hypothyroidism, obesity.   Medications prior to admission had included:  1.  Allegra 60 mg b.i.d.  2.  Lexapro 20 mg daily.  3.  Lisinopril 20 mg daily.  4.  Levothyroxine 150 mcg daily.   Medication allergies:  None.   PAST OPERATIVE HISTORY:  Essentially none.   FAMILY HISTORY:  Noncontributory.   PHYSICAL EXAMINATION:  VITAL SIGNS:  Tmax 103. Blood pressure now 70/40,  initially was 208/98 after intubation. Pulse is 130, sinus tachycardia. The  patient is orally intubated.  CHEST:  Rales, distant breath  sounds.  CARDIAC:  Tachycardia without S3.  ABDOMEN:  Soft, distended, bowel sounds hypoactive.  EXTREMITIES:  Warm, perfused.  SKIN:  Clear.  NEUROLOGIC:  The patient is awake and alert.   LABORATORY DATA:  INR 1.9, PTT 42 seconds. Chest x-ray:  ARDS picture. CO-  Oximetery is 83%. BMET:  Sodium 137, potassium 4.3, chloride 102, CO2 19,  BUN 26, creatinine 3.5, blood sugar 112. A pH 7.14, PCO2 55, PO2 123. White  count is 19.7, platelet count 186. SGOT 540, SGPT 332, albumin 2.5,  bilirubin 9.3. Urinalysis:  7-10 white cells, positive leukocyte esterase.   IMPRESSION:  Septic shock with severe sepsis with urinary tract as source,  with ureteral stent and stones status post left kidney placement with  pyelonephritis, now with adult respiratory distress syndrome, ventilator-  dependent respiratory failure, acute respiratory distress, metabolic  acidosis, acute renal insufficiency. Preexisting history of hypertension,  hypercholesterolemia, obesity, and hypothyroidism.   RECOMMENDATIONS:  Place on septic shock protocol, administer Xigris, given  full ventilatory support. Goal of CVP of 8-12, MAP of  great than 65 mmHg,  SvO2 greater than 70%, urine output greater than 0.5 cc/kg/hour. Full  ventilatory support with low-stretch tidal volume of 300, Xigris, low-  molecular-weight heparin, proton pump inhibitors, follow-up labs and x-ray,  give IV Zosyn and vancomycin.      Asencion Noble, M.D. [facility]  Electronically Signed     PW/MEDQ  D:  11/26/2004  T:  11/27/2004  Job:  [id number]   cc:   Biagio Borg, M.D. [facility]  [address]  [place] [address]

## 2010-07-07 NOTE — H&P (Signed)
NAME:  Heather Dawson, Heather Dawson NO.:  [id number]   MEDICAL RECORD NO.:  [id number]          PATIENT TYPE:  INP   LOCATION:  3030                         FACILITY:  [facility]   PHYSICIAN:  Barnetta Chapel A. Jacolyn Reedy, M.D.DATE OF BIRTH:  [birth year]   DATE OF ADMISSION:  12/22/2004  DATE OF DISCHARGE:                                HISTORY & PHYSICAL   This was a code stroke, symptom onset around 4:45.  The code stroke was  called at 5:30 p.m. approximately.   CHIEF COMPLAINT:  Left sided numbness.   HISTORY OF PRESENT ILLNESS:  Heather Dawson is a 55 -year-old, left handed, white  woman who was just discharged a week ago from the [facility] after  admission for kidney stone, complicated by renal failure with a creatinine  of 10.3 and critical illness with sepsis and ventilator dependent.  After  discharge, on December 20, 2004, two days ago her creatinine had come down to  6.5.  She had to have a left ureteral stent placed, on November 26, 2004, by  Dr. Rosana Hoes.  She was discharged, on December 14, 2004, to follow up with Dr.  Jenny Dawson, Dr. Lorrene Reid, and Dr. Rosana Hoes.  Since discharge, meds have been changed.  She is no longer on Lexapro or Flonase and labetalol was added at 200 mg  b.i.d.  She took her first dose of that today.  A few hours after that  developed a severe cough that would not stop.  She says she got very red and  hot and tingly on the left side, called the renal doctors and then called  her husband and then 911 was called.   REVIEW OF SYSTEMS:  She feels very chilled and cold despite many blankets,  although she is afebrile.  She feels colder on the left side than on the  right.  Otherwise no chest pain, shortness of breath, or headache.   PAST MEDICAL HISTORY:  1.  Acute renal failure secondary to E. coli sepsis secondary to      pyelonephritis with obstruction and creatinine started at 10.3, two days      ago it was down to 6.5.  2.  She is status post ventilator dependent  respiratory failure.  3.  She has anemia.  4.  Varicella zoster.  5.  UTI.  6.  Left ureteral stent placed.  7.  Depression.  8.  Obesity.  9.  Hypertension.  10. Hyperlipidemia.  11. Hypothyroidism.   MEDICATIONS:  1.  Protonix 40 mg a day.  2.  Labetalol 200 mg b.i.d., new today, she just took one dose.  3.  Norvasc 10 mg daily.  4.  Levothyroxine 150 mcg daily.  5.  Mucinex p.r.n.  She is no longer on the Lexapro or Flonase.   ALLERGIES:  No known drug allergies.   SOCIAL HISTORY:  She is married, remote cigarette use.   FAMILY HISTORY:  Noncontributory.   OBJECTIVE:  VITAL SIGNS:  On exam temperature is 97.9, pulse 79,  respirations 24, BP 116/71, 100% sat on 2 liters.  HEENT:  Head  is normocephalic, atraumatic.  NECK:  Supple without bruits.  HEART:  Regular rhythm.  ABDOMEN:  Obese.  EXTREMITIES:  Without edema.  NEUROLOGIC:  She scores only 1 on the stroke scale.  Level of consciousness  is keenly alert.  Her response to questions is accurate.  Her following  commands is accurate.  Best gaze is normal.  Full extraocular movement.  Visual fields are normal.  She has normal facial motor.  On motor exam there  is no drift on either upper or lower extremity There is no ataxia.  Sensory  exam is objectively normal.  She feels things on both sides evenly but does  say it still feels tingly on the left.  I gave her one point for that.  There is no aphasia, no dysarthria, and no neglect.  Deep tendon reflexes  are 2+ in the lower extremities with downgoing toes, 1+ in the upper  extremities.  She has normal pulses.   CT scan of the brain is normal.  Labs are pending.   IMPRESSION:  Transient left sided numbness and rigor in afebrile state.   Her symptoms were precipitated by a violent coughing fit a few hours after  taking labetalol may be completely unrelated.  Neuro exam is now normal  objectively with minimal subjective tingling in the left upper extremity.   Possible TIA but I can not rule out other etiology.  She is not a TPA or a  stroke study candidate.   PLAN:  1.  I will admit her for a limited TIA workup.  2.  We can not do an MRA of the neck because of the creatinine.  3.  We will begin her on aspirin.  4.  I will ask [facility] to see her to address medical issues, the      labetalol, her rigors, and any other advice they might have.      Catherine A. Jacolyn Reedy, M.D.  Electronically Signed     CAW/MEDQ  D:  12/22/2004  T:  12/22/2004  Job:  [id number]   cc:   Heather Dawson, Dr.  Angelina Sheriff Kidney

## 2010-07-07 NOTE — Discharge Summary (Signed)
NAME:  Heather Dawson, Heather Dawson                  ACCOUNT NO.:  [id number]   MEDICAL RECORD NO.:  [id number]          PATIENT TYPE:  INP   LOCATION:  9319                          FACILITY:  [facility]   PHYSICIAN:  Monia Sabal. Corinna Capra, M.D.    DATE OF BIRTH:  06/26/1955   DATE OF ADMISSION:  06/07/2005  DATE OF DISCHARGE:  06/08/2005                                 DISCHARGE SUMMARY   HISTORY OF PRESENT ILLNESS:  Heather Dawson is a 55 year old, G1, P1, who  presented to my office for evaluation of abnormal bleeding and worsening  abdominal pain.  She has been evaluated by her urologist.  Did a full CT and  full workup three days ago which was normal.  Two days ago, she went to her  GI doctor who did an upper endoscopy, told her that it was not GI related,  presented to me with bleeding and pain.  Ultrasound shows large left ovarian  cyst and also multiple endometrial polyps.  Because of worsening pain not  responding to conservative management, the patient desires definitive  surgical evaluation.   Planned laparoscopic left salpingo-oophorectomy with removal of the cyst.  Also planned hysteroscopy and D&C because of the bleeding and polyps.  Risks  and benefits have been discussed and informed consent was obtained.  See  history and physical for further detail.   HOSPITAL COURSE:  The patient underwent hysteroscopy and D&C which revealed  multiple polyps which were easily removed.  The hysteroscopy was  uncomplicated.  The laparoscopy did show a large 6.5 cm serous-appearing  cyst with dense adhesions in the adnexa with the bowel and pelvic side wall  and uterus.  After careful dissection, the ovary was removed without  apparent complications with minimal blood loss.  Her postoperative care was  unremarkable.   By postoperative day one, she was tolerating a regular diet and ambulating  without difficulty and not requiring anything for pain and had normal active  bowel sounds.  Excisions were clean, dry and intact  and the patient was  urinating without difficulty without flank pain and without hematuria.  The  patient was discharged home with follow up in the office in two to three  weeks.  Told to follow up with her urologist due to the fact that she has  had kidney problems in the past.  Told to return to me for increased pain,  fever or bleeding.  I did send her home with a prescription for Vicodin #20.      Monia Sabal Corinna Capra, M.D.  Electronically Signed     DCL/MEDQ  D:  06/08/2005  T:  06/09/2005  Job:  [id number]

## 2010-07-07 NOTE — Op Note (Signed)
NAME:  Heather Dawson, Heather Dawson                            ACCOUNT NO.:  [id number]   MEDICAL RECORD NO.:  [id number]                   PATIENT TYPE:  AMB   LOCATION:  [place]                                  FACILITY:  [facility]   PHYSICIAN:  Maisie Fus, M.D.                DATE OF BIRTH:  1955/08/02   DATE OF PROCEDURE:  07/26/2003  DATE OF DISCHARGE:                                 OPERATIVE REPORT   PREOPERATIVE DIAGNOSIS:  Persistent menometrorrhagia, uterine leiomyomata,  no intracavitary endometrial mass on sonohysterogram done on Jul 13, 2003.   POSTOPERATIVE DIAGNOSIS:  Persistent menometrorrhagia, uterine leiomyomata,  no intracavitary endometrial mass on sonohysterogram done on Jul 13, 2003,  with significant uterine enlargement with normal cavity except for  enlargement.   OPERATIVE PROCEDURE:  Hysteroscopy, dilation and curettage, Novasure  endometrial ablation.   ESTIMATED BLOOD LOSS:  20 mL.   SORBITOL DEFICIT:  60 mL.   COMPLICATIONS:  None.   INDICATIONS FOR PROCEDURE:  The patient is a 55 year old who has a  significant recent history of menometrorrhagia.  Attempts have been made to  control her bleeding with oral contraceptives and she has required Ovcon 35  t.i.d. to significantly slow her bleeding.  She presented to the office June  3 complaining of persistent uterine bleeding which had markedly increased in  flow every time she tried to reduce to one Ovcon per day.  She also was  having severe lower back pain, particularly left hip, and lay back pain.  Because of the persistence of her bleeding symptoms, we have elected the  most reasonable acute procedure and she is now admitted for hysteroscopy,  D&C, and Novasure ablation of the endometrium.  She was given Flexeril and  Vicodin to be taken for her back pain.  She will use these in the postop  period for back pain and/or postoperative pain.   DESCRIPTION OF PROCEDURE:  She was admitted on the morning of surgery.  She  was given a bolus of Ancef preoperatively.  She was brought to the operating  room and placed under adequate intravenous sedation, placed in the dorsal  lithotomy position using the [place] system.  Betadine prep was  carried out in the usual fashion.  Sterile drapes were applied.  The cervix  was visualized using a bivalve speculum.  The cervix was grasped on the  anterior lip with a single tooth tenaculum.  A paracervical block was placed  using 10 mL of 1% Xylocaine.  Injections were made at 4 and 8 o'clock in the  vaginal fornices.  The cervix was sounded to 4 cm.  The uterus was sounded  to 11.5 cm.  The cervix was progressively dilated to 23 with [facility] dilators.  The 12.5 degree [facility] hysteroscope was introduced and inspection of the  uterine cavity revealed no masses, just an enlargement of the cavity.  A  gentle  thorough curettage was obtained and samples submitted for histologic  examination.  The Novasure device was then loaded and positioned properly.  The carbon dioxide test was negative for any evidence of perforation.  The  uterine fundal width was 2.5 cm.  After very careful repeated seating of the  device, the Novasure procedure was carried out.  A total of 67 seconds was  required.  Reinspection with the hysteroscope revealed adequate coagulation  of the entire endometrial surface.  The procedure, at this point, was  terminated.  All  instruments were removed.  The patient was awakened and taken to the  recovery room in good condition.  She will be seen in the office in  approximately one week for postoperative follow up.  She has pain medicines  as noted above.  She is to report heavy bleeding or fever.                                               Maisie Fus, M.D.    WRN/MEDQ  D:  07/26/2003  T:  07/26/2003  Job:  [id number]

## 2010-07-07 NOTE — Discharge Summary (Signed)
NAME:  Heather Dawson, Heather Dawson NO.:  [id number]   MEDICAL RECORD NO.:  [id number]          PATIENT TYPE:  INP   LOCATION:  5703                         FACILITY:  [facility]   PHYSICIAN:  Louis Heather Dawson, M.D.DATE OF BIRTH:  04/13/1955   DATE OF ADMISSION:  01/16/2005  DATE OF DISCHARGE:  01/19/2005                                 DISCHARGE SUMMARY   DISCHARGE DIAGNOSES:  1.  Acute on chronic renal failure.  2.  Left-sided flank pain without obstruction.  3.  History of E. coli pyelonephritis.  4.  Hypothyroidism.  5.  Hypertension.   HISTORY OF PRESENT ILLNESS AND HOSPITAL COURSE:  [name] is a 55 year old  white female with past medical history significant for acute renal failure  requiring dialysis secondary to E. coli urosepsis with urinary obstruction  status post ureteral stent placement.  This occurred in October of 2006.  During that hospitalization, the patient was taken off of dialysis and was  sent home on October 26 with a creatinine of 9.1.  She had been followed  closely by [place] as an outpatient and on November 13 was noted to have a  creatinine level of 2.6.  After the creatinine level of 2.6, the patient was  diagnosed with urinary tract infection by one of her other physicians and  was started on Cipro.  She continued to do very well, and actually on the  day of admission, which was January 16, 2005, she had her ureteral stent  removed per Dr. Rosana Dawson, and the procedure went without difficulty.  She had  had labs done prior to that procedure and they actually returned a  creatinine level of 1.7, so she was admitted for further workup.  Interestingly, she states that she felt well and was not really uremic at  the time.  She was admitted and hydrated with normal saline.  A renal  ultrasound was obtained, which did show some moderate hydronephrosis of the  left.  Dr. Rosana Dawson was involved with her hospitalization and did not feel that  the hydronephrosis  was of consequence, and he did not wish to replace the  stent.  Another interesting thing on her ultrasound was that her right  kidney, which had been fairly normal sized in October, was now down to 8 cm.   The patient was observed in the hospital for three days.  Her kidney  function improved from a creatinine level of 7.7, and it was 5.7 upon  discharge, so she was felt stable for outpatient followup of this issue.  She also had a repeat renal ultrasound on December 1 to make sure that her  mild hydronephrosis did not change, which it did not.  She had good urine  output throughout her hospitalization.   DISCHARGE MEDICATIONS:  1.  Norvasc 10 mg per day.  2.  Clonidine 0.1 mg two times a day.  3.  Protonix 40 mg a day.  4.  Synthroid 150 mg a day.  5.  Maxzide one tablet a day.   The patient has no limitations to her activity or  diet.   FOLLOWUP:  The patient will have labs done at [facility] on  Monday, December 4, to evaluate her renal function.  We will also gets labs  on Friday, December 8.  She has a follow-up appointment with Dr. Marval Dawson  on December 18.           ______________________________  Louis Heather Dawson, M.D.     KAG/MEDQ  D:  01/19/2005  T:  01/20/2005  Job:  [id number]   cc:   Heather Dawson, M.D. [facility]  [address]  [place]  [place] [address]   Heather Dawson, M.D.  Fax: [phone]

## 2010-07-07 NOTE — Op Note (Signed)
NAME:  Heather Dawson, Heather Dawson                  ACCOUNT NO.:  [id number]   MEDICAL RECORD NO.:  [id number]          PATIENT TYPE:  AMB   LOCATION:  SDC                           FACILITY:  [facility]   PHYSICIAN:  Monia Sabal. Corinna Capra, M.D.    DATE OF BIRTH:  1955/11/15   DATE OF PROCEDURE:  03/20/2006  DATE OF DISCHARGE:                               OPERATIVE REPORT   PREOPERATIVE DIAGNOSES:  Pelvic pain, anemia, with abnormal uterine  bleeding.   POSTOPERATIVE DIAGNOSES:  Pelvic pain, anemia, with abnormal uterine  bleeding.   PROCEDURE:  Laparoscopic-assisted vaginal hysterectomy with right  salpingo-oophorectomy.   SURGEON:  Monia Sabal. Corinna Capra, M.D.   ASSISTANTMaisie Fus, M.D.   ANESTHESIA:  General endotracheal.   INDICATIONS FOR PROCEDURE:  Heather Dawson is a 55 year old G-1, P-1, with  worsening pelvic pain, anemia and abnormal bleeding, which has not been  responsive to conservative medical management, which included a D&C and  also endometrial ablation.  She is currently on t.i.d. birth control  pills just to control the bleeding.  Her hemoglobin is finally back up  to 13.9 after this regimen and iron therapy.  A recent ultrasound showed  suspicion for adenomyosis.  She presents today for definitive surgical  intervention.  Plan a laparoscopically-assisted vaginal hysterectomy  with right salpingo-oophorectomy.  The risks and benefits of this  procedure were discussed at length.  An informed consent was obtained.  See the history and physical for further details.   FINDINGS:  At the time of surgery a normal-appearing live, normal-  appearing right tube and ovary.  The uterus was enlarged about six to  eight weeks' size, bulky, consistent with adenomyosis.  The left tube  and ovary were surgically absent.   DESCRIPTION OF PROCEDURE:  After adequate analgesia the patient was  placed in the dorsal lithotomy position.  She was sterilely prepped and  draped.  The bladder was thoroughly drained.   A Graves speculum was  placed and a Hulka tenaculum was placed on the cervix.  A 1 cm  infraumbilical skin incision was made.  A Veress needle was inserted.  The abdomen was insufflated.  Dullness to percussion.  An 11 mm trocar  was inserted.  The laparoscopic was inserted.  The above findings were  noted.  A 5 mm trocar was inserted to the left of the midline two finger  breadths above the pubic symphysis under direct visualization. A Gyrus  tri-polar grasping forceps was used to ligate across the infundibular  pelvic ligament near the ovary, care taken to avoid the underlying  ureter.  Good hemostasis was achieved.  It was dissected and re-  coagulated down across the infundibular pelvic ligament, down to the  inferior portion of the broad ligament, across the round ligament.  The  left uterine-ovarian ligament was surgically absent.  The round ligament  was coagulated and dissected along the left side.  The bladder was then  elevated.  A small window was made at the uterovesical junction and a  small bladder flat.  The legs were repositioned.  The abdomen de-  sufflated.  A posterior weighted speculum was placed.  A posterior  colpotomy was performed.  The cervix was circumscribed with Bovie  cautery.  The posterior colpotomy was performed and LigaSure instruments  used to ligate across the uterosacral ligaments bilaterally and  dissected with the Mayo scissors.  The cardinal ligaments and the  bladder pillars were dissected using LigaSure and Mayo scissors as well.  The anterior vaginal mucosa was entered and a Deaver placed below the  bladder.  The inferior portions of the broad ligament were then grasped  and ligated with the [employer].  The fundus of the uterus was  grasped and delivered into the introitus.  The uterus was removed with  the right tube and ovary intact.  A small ribbon bag was placed.  The  uterosacral ligaments were identified, grasped and ligated with  #0  Monocryl suture in a figure-of-eight fashion.  The posterior peritoneum  was closed in a pursestring fashion using #0 Monocryl suture.  The  posterior vaginal mucosa was closed using figure-of-eight #0 Monocryl  suture, plicating the uterosacral ligaments to the midline.  The pack  was removed.  The anterior vaginal mucosa was closed with figure-of-  eight #0 Monocryl suture in a vertical fashion.  Good hemostasis in the  vaginal cuff was achieved.  The legs were repositioned.  The abdomen was  re-insufflated.  The __________  suction-irrigator was used to irrigate  the pelvis.  Careful examination of the pedicles revealed good  hemostasis.  After a copious amount of irrigation, adequate hemostasis  was assured.  The trocars were removed.  The abdomen was de-sufflated.  The infraumbilical skin incision was closed with #0 Vicryl interrupted  suture.  The fascia closed with #3-0 Vicryl Rapide subcuticular suture.  The __________  was closed with #3-0 Vicryl Rapide subcuticular suture.  The incision was injected with 0.25% Marcaine, a total of 10 mL used.   The patient was stable and was transferred to the recovery room.  The  sponge, instrument and needle counts were correct x3.  The estimated  blood loss was 500 mL.  The patient did receive 1 gram of Cefotetan  preoperatively.      Monia Sabal Corinna Capra, M.D.  Electronically Signed     DCL/MEDQ  D:  03/20/2006  T:  03/20/2006  Job:  [id number]

## 2010-07-07 NOTE — Discharge Summary (Signed)
NAME:  Heather Dawson, Heather Dawson                  ACCOUNT NO.:  [id number]   MEDICAL RECORD NO.:  [id number]          PATIENT TYPE:  INP   LOCATION:  3030                         FACILITY:  [facility]   PHYSICIAN:  Pramod P. Leonie Man, MD    DATE OF BIRTH:  10/13/1955   DATE OF ADMISSION:  12/22/2004  DATE OF DISCHARGE:  12/25/2004                                 DISCHARGE SUMMARY   ADMISSION DIAGNOSIS:  Left-sided numbness.   DISCHARGE DIAGNOSIS:  1.  Right hemispheric transient ischemic attack.  2.  Hypertension.  3.  Hyperlipidemia.  4.  Obesity.  5.  Recent acute renal failure secondary to Escherichia coli sepsis and      pyelonephritis with obstruction.   HOSPITAL COURSE:  Kindly see Dr. Michael Litter excellent admission H&P on  December 22, 2004 for details. [name] is a 55 year old pleasant Caucasian  lady who developed sudden onset of left body numbness following bruxism of  violent coughing lasting several minutes. The numbness lasted for an hour  and a half. She had no weakness. She was able to walk. She was seen in the  emergency room and the symptoms started improving after admission. Initial  CT scan of the head was unremarkable. Subsequently, a MRI scan of the brain  was obtained which revealed no acute infarct. Contrast could not be  administered secondary to the patient having renal failure and concerns  about contrast injury to the kidney and hence a MRA of the neck was not  obtained. Carotid ultrasound showed no significant extracranial stenosis.  Transcranial Doppler bubble study was done but results are pending. A 2-D  echocardiogram was also done and the results were pending; however, the  patient had previously had a 2-D echocardiogram on November 27, 2004 which  revealed normal ejection fraction without obvious cardiac source of  embolism. Fasting lipid profile, hemoglobin A1c and homocystine are also  pending.   The patient was kept on telemetry monitoring during the two days of  hospitalization and she had no significant cardiac arrhythmias. Her vascular  risk factors are identified included hypertension, hyperlipidemia, and  obesity. She was started on aspirin for secondary stroke prevention and  advised to lose weight. She was asked to follow-up with her primary  physician Dr. Jenny Reichmann in his office in two weeks and Dr. Leonie Man in his office in  two to three months. She was also asked to call Dr. Clydene Fake office and set  up for transcranial Doppler bubble study to evaluate for possible PFO as an  outpatient.   DISCHARGE MEDICATION:  1.  Aspirin 325 milligrams a day.  2.  Protonix 40 milligrams a day.  3.  Synthroid 150 mcg daily.  4.  Norvasc 10 milligrams at bedtime.  5.  Mucinex DM 1 or 2 every 12 hours as needed.           ______________________________  Kathie Rhodes. Leonie Man, MD     PPS/MEDQ  D:  12/25/2004  T:  12/25/2004  Job:  [id number]   cc:   Biagio Borg, M.D. [facility]  [address]  [place]  [place] [address]

## 2010-07-07 NOTE — Op Note (Signed)
NAME:  Heather Dawson, Heather Dawson                            ACCOUNT NO.:  [id number]   MEDICAL RECORD NO.:  [id number]                   PATIENT TYPE:  INP   LOCATION:  5526                                 FACILITY:  [facility]   PHYSICIAN:  Marland Kitchen T. Hoxworth, M.D.          DATE OF BIRTH:  12-26-1955   DATE OF PROCEDURE:  02/01/2002  DATE OF DISCHARGE:                                 OPERATIVE REPORT   PREOPERATIVE DIAGNOSIS:  Cholelithiasis.   POSTOPERATIVE DIAGNOSIS:  Cholelithiasis.   PROCEDURE:  Laparoscopic cholecystectomy with intraoperative cholangiogram.   SURGEON:  Marland Kitchen T. Hoxworth, M.D.   ASSISTANT:  Orson Ape. Rise Patience, M.D.   ANESTHESIA:  General.   BRIEF HISTORY:  The patient is a 55 year old white female who presents with  persistent and recurring episodes of severe epigastric abdominal pain.  Evaluation included an ultrasound showing multiple gallstones.  She has had  mildly elevated transaminases.  A laparoscopic cholecystectomy with  cholangiogram has been recommended and accepted.  The nature of the  procedure, indications, the risks of bleeding, infection, bile leak, and  bile duct injury were discussed and understood.  She is now brought to the  operating room for this procedure.   DESCRIPTION OF PROCEDURE:  The patient was brought into the operating room  and placed in the supine position on the operating table, and general  endotracheal anesthesia was induced.  She received preoperative antibiotics.  PAS were in place.  The abdomen was sterilely prepped and draped.  Local  anesthesia was used to infiltrate the trocar sites prior to the incision.  A  1 cm incision made in the umbilicus and dissection carried down to the  midline fascia, which was sharply incised for 1 cm and the peritoneum  entered under direct vision.  Through a mattress suture of 0 Vicryl, the  Hasson trocar was inserted and pneumoperitoneum established.  Under direct  vision a 10 mm trocar was  placed in the subxiphoid area and two 5 mm trocars  in the right subcostal margin.  The gallbladder was compressed and elevated  up over the liver and the infundibulum retracted inferolaterally.  The  peritoneum on either side of Calot's triangle was incised and fibrofatty  tissue stripped off the neck of the gallbladder  toward the porta hepatis.  The cystic duct was identified and dissected free for about a centimeter and  the cystic duct-gallbladder junction dissected for 360 degrees.  The cystic  artery was identified.  When the anatomy was clear, the cystic duct was  clipped at the gallbladder junction and operative cholangiograms were taken  through the cystic duct.  This showed good filling of normal common bile  duct and intrahepatic ducts with free flow into the duodenum and no filling  defects.  Following this, the cholangiocath was removed and the cystic duct  was doubly clipped proximally and divided.  The cystic duct was doubly  clipped  proximally, clipped distally, and divided.  The gallbladder was then  dissected free from its bed with cautery and removed through the umbilicus.  Complete  hemostasis was assured.  The trocars were removed under direct vision.  The  mattress suture was secured at the umbilicus.  The skin incisions were  closed with interrupted 4-0 Monocryl and Steri-Strips.  The sponge, needle,  and instrument counts were correct.  Dressings were applied and the patient  taken to recovery in good condition.                                               Darene Lamer. Hoxworth, M.D.    [place]  D:  02/01/2002  T:  02/01/2002  Job:  [id number]

## 2010-07-07 NOTE — Consult Note (Signed)
NAME:  Heather Dawson, Heather Dawson NO.:  [id number]   MEDICAL RECORD NO.:  [id number]          PATIENT TYPE:  INP   LOCATION:  [id number]                         FACILITY:  [facility]   PHYSICIAN:  James L. Deterding, M.D.DATE OF BIRTH:  Feb 02, 1956   DATE OF CONSULTATION:  DATE OF DISCHARGE:                                   CONSULTATION   REQUESTING PHYSICIAN:  Ronald L. Rosana Hoes, M.D., Dr. Loann Quill of critical  care.   We are consulted for this woman for progressive acute renal failure.   HISTORY OF PRESENT ILLNESS:  This is a 55 year old female with a history of  left flank pain approximately 48 hours prior to admission.  She had been in  the [facility] emergency room and subsequently the [facility] emergency  room and was admitted to [facility] and had a double-J stent placed on the  8th.  She had a fever of 102.3 on admission on October 7.  The stent was  placed on the 8th.  Later on the 8th, she developed hypotension and  respiratory distress and had to be intubated and have ventilatory support.  Required pressors and __________  to support her blood pressure.  She  received early goal-directed therapy with Xigris and antibiotics.  Now she  has progressing creatinine that has gone from 1 on the 7th, to 3.5 on the  8th, to 4.8 overnight.  She has E. coli growing in her urine.  Urine volume  at the current time is 10-40 cc/hr.   Apparently no history of renal disease or stones in the past.  No history of  urinary tract infections.  No family history of renal disease.   There is a history of hypertension.  Placed on lisinopril at home.   Other medications at home include Allegra-D, Lexapro, Synthroid.   PAST MEDICAL HISTORY:  Extensive in that she has had hypertension for a  number of years.  She has had hypothyroidism.  She also has had a history of  a C4-5 and C5-6 anterior cervical diskectomy with a fusion.  History of  hyperlipidemia.  History of bradycardia, on atenolol.   History of  gastroesophageal reflux in the past.  History of obesity.  Mild mitral valve  regurgitation.  She has had a cholecystectomy in the past.  Some other  operations, which I cannot understand her telling me at the current time.   She has had no known drug allergies.   She is a nonsmoker and nondrinker.   REVIEW OF SYSTEMS:  She denies headaches.  She denies a history of vision  difficulty, hearing difficulty.  She and her husband say she has some  dyspnea on exertion but has had no sputum production.  She has had some  constipation.  No diarrhea.  She has had some constipation.  No diarrhea.  She has had the reflux esophagitis.  No history of hepatitis or yellow  jaundice in the past.  SKIN:  Unremarkable.  MUSCULOSKELETAL/NECK:  There is  a primary issue there.   OBJECTIVE:  GENERAL:  She is on the  ventilator.  Cooperative.  There is oral  intubation.  She is very sleepy but cooperative.  Answers many questions.  VITAL SIGNS: Blood pressure right now is 99991111 systolic.  Earlier it was  90-110.  Heart rate is 110-120.  Temp 97.5, O2 sat 99% on 30% oxygen with  the vent.  She is edematous and very pale.  HEENT:  Blood from both nares in the oral intubation.  Fundi are  unremarkable.  NECK:  A scar, consistent with the anterior cervical diskectomy.  CARDIOVASCULAR:  Regular rhythm.  Grade 2/6 holosystolic murmur heard best  at the apex.  PMI is __________ .  Edema is 3+.  Pulses are +2/4.  No bruits  are noted.  There is no lifts, thrills, or heaves.  LUNGS:  Scattered rhonchi.  Decreased breath sounds.  Scattered rales  throughout.  She is breathing over the vent at the current time.  ABDOMEN:  Soft.  No bowel sounds.  Liver is down 2 cm.  No splenomegaly.  No  flank pain could be detected at this time.  She has no skin rashes.  MUSCULOSKELETAL:  Some hypertrophic changes in the PIP's and DIP's.  No  significant adenopathy in the axilla or supraclavicular or cervical   adenopathy.  She has a left internal jugular catheter in for IV access at  the current time.  NEUROLOGIC:  Cranial nerves II-XII are grossly intact.  Motor is 4/5 and  symmetric at this time.  Toes are downgoing.   Laboratory data reveals a sodium of 138, potassium 4, chloride 109,  bicarbonate 20, creatinine 4.8, BUN 39, glucose 109.  D-dimer is greater  than 20.  INR is 1.6.  Normal fibrinogen.  PTT is 88.  Calcium 6.3.  TSH is  29.  Urinalysis two days ago had large blood, 7-10 red cells, 3-5 white  cells.   ASSESSMENT:  Acute __________  decreased at this time but it is impossible  to tell the exact level of it.  She has volume excess and is acidemic.  She  is volume-expanded, consistent with her already goal-directed therapy.  Her  blood pressure has been stabilized.  Uropressors can be weaned.  The  etiology of her acute renal failure is that of hypotension with ischemic  acute tubular necrosis most likely versus disseminated intravascular  coagulation.  Need to rule out obstruction.  Hemolytic uremic syndrome with  Escherichia coli.  Potassium is okay at this time, so would make hemolytic  uremic syndrome less likely.  1.  Decreased calcium secondary to the acute renal failure:  Need to check      her albumin as well as her phosphorus.  She needs IV bicarbonate and      decreasing amount of __________  administered at this time.  2.  Ventilatory dependent respiratory failure:  __________  is probably the      etiology of her exacerbation.  3.  Escherichia coli sepsis:  Blood cultures negative, yet, need to rule out      hemolytic uremic syndrome.  4.  Renal stone disease:  We need to make sure her right kidney is okay and      not obstructed.  5.  Disseminated intravascular coagulation.  6.  Hypertension.  7.  History of hypothyroidism:  __________ .  8.  Anemia:  This is acute.  Do not feel the need to be aggressive with     therapy at this point.   PLAN:  1.  Isotonic  sodium bicarbonate IV with lower volumes.  2.  Decreased norepinephrine.  3.  Check an LDH.  4.  Check a phosphorus.  5.  Ultrasound.  6.  Urinalysis.           ______________________________  Joyice Faster Deterding, M.D.     JLD/MEDQ  D:  11/27/2004  T:  11/27/2004  Job:  [id number]

## 2010-08-05 ENCOUNTER — Encounter: Payer: Self-pay | Admitting: Internal Medicine

## 2010-10-09 ENCOUNTER — Other Ambulatory Visit (HOSPITAL_COMMUNITY): Payer: Self-pay | Admitting: Urology

## 2010-10-09 DIAGNOSIS — N200 Calculus of kidney: Secondary | ICD-10-CM

## 2010-10-10 ENCOUNTER — Ambulatory Visit (HOSPITAL_COMMUNITY)
Admission: RE | Admit: 2010-10-10 | Discharge: 2010-10-10 | Disposition: A | Discharge status: Home / Self Care | Payer: Self-pay | Source: Ambulatory Visit | Attending: Urology | Admitting: Urology

## 2010-10-10 DIAGNOSIS — I700 Atherosclerosis of aorta: Secondary | ICD-10-CM | POA: Insufficient documentation

## 2010-10-10 DIAGNOSIS — Z9089 Acquired absence of other organs: Secondary | ICD-10-CM | POA: Insufficient documentation

## 2010-10-10 DIAGNOSIS — R300 Dysuria: Secondary | ICD-10-CM | POA: Insufficient documentation

## 2010-10-10 DIAGNOSIS — N269 Renal sclerosis, unspecified: Secondary | ICD-10-CM | POA: Insufficient documentation

## 2010-10-10 DIAGNOSIS — N949 Unspecified condition associated with female genital organs and menstrual cycle: Secondary | ICD-10-CM | POA: Insufficient documentation

## 2010-10-10 DIAGNOSIS — N200 Calculus of kidney: Secondary | ICD-10-CM

## 2010-10-10 DIAGNOSIS — R319 Hematuria, unspecified: Secondary | ICD-10-CM | POA: Insufficient documentation

## 2010-10-10 DIAGNOSIS — K7689 Other specified diseases of liver: Secondary | ICD-10-CM | POA: Insufficient documentation

## 2010-10-10 DIAGNOSIS — K429 Umbilical hernia without obstruction or gangrene: Secondary | ICD-10-CM | POA: Insufficient documentation

## 2010-11-17 LAB — POCT I-STAT, CHEM 8
BUN: 25 — ABNORMAL HIGH
Calcium, Ion: 1.08 — ABNORMAL LOW
Chloride: 107
Creatinine, Ser: 1.4 — ABNORMAL HIGH
Glucose, Bld: 111 — ABNORMAL HIGH
HCT: 47 — ABNORMAL HIGH
Hemoglobin: 16 — ABNORMAL HIGH
Potassium: 3.6
Sodium: 139
TCO2: 26

## 2010-11-17 LAB — POCT CARDIAC MARKERS
CKMB, poc: <1 — ABNORMAL LOW
Myoglobin, poc: 46.6
Operator id: 295021
Troponin i, poc: <0.05

## 2010-11-17 LAB — CBC
HCT: 45.7
Hemoglobin: 15.6 — ABNORMAL HIGH
MCHC: 34.1
MCV: 92.9
Platelets: 279
RBC: 4.92
RDW: 13.1
WBC: 8.3

## 2010-11-17 LAB — DIFFERENTIAL
Basophils Absolute: 0
Basophils Relative: 0
Eosinophils Absolute: 0.2
Eosinophils Relative: 2
Lymphocytes Relative: 19
Lymphs Abs: 1.6
Monocytes Absolute: 0.4
Monocytes Relative: 5
Neutro Abs: 6.1
Neutrophils Relative %: 74

## 2010-11-20 LAB — BASIC METABOLIC PANEL
BUN: 24 — ABNORMAL HIGH
CO2: 24
Calcium: 9.4
Chloride: 104
Creatinine, Ser: 1.33 — ABNORMAL HIGH
GFR calc Af Amer: 51 — ABNORMAL LOW
GFR calc non Af Amer: 42 — ABNORMAL LOW
Glucose, Bld: 132 — ABNORMAL HIGH
Potassium: 3.8
Sodium: 137

## 2010-11-20 LAB — POCT CARDIAC MARKERS
CKMB, poc: <1 — ABNORMAL LOW
Myoglobin, poc: 50.9
Troponin i, poc: <0.05

## 2010-11-20 LAB — CBC
HCT: 39.2
Hemoglobin: 13.6
MCHC: 34.6
MCV: 92.9
Platelets: 204
RBC: 4.22
RDW: 12.8
WBC: 7.3

## 2010-11-20 LAB — GLUCOSE, CAPILLARY: Glucose-Capillary: 98

## 2010-11-20 LAB — POCT I-STAT, CHEM 8
BUN: 23
Calcium, Ion: 1.06 — ABNORMAL LOW
Chloride: 104
Creatinine, Ser: 1.7 — ABNORMAL HIGH
Glucose, Bld: 95
HCT: 43
Hemoglobin: 14.6
Potassium: 3.5
Sodium: 140
TCO2: 29

## 2010-11-20 LAB — APTT: aPTT: 28

## 2010-11-20 LAB — PROTIME-INR
INR: 1
Prothrombin Time: 12.9

## 2010-11-29 ENCOUNTER — Ambulatory Visit: Payer: Self-pay | Admitting: Gastroenterology

## 2010-12-27 ENCOUNTER — Ambulatory Visit: Payer: Self-pay | Admitting: Gastroenterology

## 2010-12-27 ENCOUNTER — Other Ambulatory Visit: Payer: Self-pay | Admitting: Internal Medicine

## 2010-12-30 ENCOUNTER — Encounter: Payer: Self-pay | Admitting: Internal Medicine

## 2010-12-30 DIAGNOSIS — Z0000 Encounter for general adult medical examination without abnormal findings: Secondary | ICD-10-CM | POA: Insufficient documentation

## 2010-12-30 DIAGNOSIS — Z0001 Encounter for general adult medical examination with abnormal findings: Secondary | ICD-10-CM | POA: Insufficient documentation

## 2011-01-03 ENCOUNTER — Ambulatory Visit (INDEPENDENT_AMBULATORY_CARE_PROVIDER_SITE_OTHER): Payer: BC Managed Care – PPO | Admitting: Internal Medicine

## 2011-01-03 ENCOUNTER — Other Ambulatory Visit (INDEPENDENT_AMBULATORY_CARE_PROVIDER_SITE_OTHER): Payer: BC Managed Care – PPO

## 2011-01-03 ENCOUNTER — Encounter: Payer: Self-pay | Admitting: Internal Medicine

## 2011-01-03 ENCOUNTER — Other Ambulatory Visit: Payer: Self-pay | Admitting: Internal Medicine

## 2011-01-03 VITALS — BP 130/78 | HR 79 | Temp 98.1°F | Ht 62.0 in | Wt 215.5 lb

## 2011-01-03 DIAGNOSIS — Z0000 Encounter for general adult medical examination without abnormal findings: Secondary | ICD-10-CM

## 2011-01-03 DIAGNOSIS — L408 Other psoriasis: Secondary | ICD-10-CM

## 2011-01-03 DIAGNOSIS — E119 Type 2 diabetes mellitus without complications: Secondary | ICD-10-CM

## 2011-01-03 DIAGNOSIS — Z23 Encounter for immunization: Secondary | ICD-10-CM

## 2011-01-03 DIAGNOSIS — L409 Psoriasis, unspecified: Secondary | ICD-10-CM

## 2011-01-03 HISTORY — DX: Psoriasis, unspecified: L40.9

## 2011-01-03 LAB — URINALYSIS, ROUTINE W REFLEX MICROSCOPIC
Bilirubin Urine: NEGATIVE
Ketones, ur: NEGATIVE
Leukocytes, UA: NEGATIVE
Nitrite: NEGATIVE
Specific Gravity, Urine: 1.02 (ref 1.000–1.030)
Total Protein, Urine: NEGATIVE
Urine Glucose: >=1000
Urobilinogen, UA: 0.2 (ref 0.0–1.0)
pH: 6 (ref 5.0–8.0)

## 2011-01-03 LAB — CBC WITH DIFFERENTIAL/PLATELET
Basophils Absolute: 0 10*3/uL (ref 0.0–0.1)
Basophils Relative: 0.4 % (ref 0.0–3.0)
Eosinophils Absolute: 0.4 10*3/uL (ref 0.0–0.7)
Eosinophils Relative: 4.2 % (ref 0.0–5.0)
HCT: 42.4 % (ref 36.0–46.0)
Hemoglobin: 14.4 g/dL (ref 12.0–15.0)
Lymphocytes Relative: 21.2 % (ref 12.0–46.0)
Lymphs Abs: 1.9 10*3/uL (ref 0.7–4.0)
MCHC: 34.1 g/dL (ref 30.0–36.0)
MCV: 93.1 fl (ref 78.0–100.0)
Monocytes Absolute: 0.5 10*3/uL (ref 0.1–1.0)
Monocytes Relative: 5.2 % (ref 3.0–12.0)
Neutro Abs: 6.1 10*3/uL (ref 1.4–7.7)
Neutrophils Relative %: 69 % (ref 43.0–77.0)
Platelets: 206 10*3/uL (ref 150.0–400.0)
RBC: 4.56 Mil/uL (ref 3.87–5.11)
RDW: 12.8 % (ref 11.5–14.6)
WBC: 8.8 10*3/uL (ref 4.5–10.5)

## 2011-01-03 LAB — HEPATIC FUNCTION PANEL
ALT: 48 U/L — ABNORMAL HIGH (ref 0–35)
AST: 24 U/L (ref 0–37)
Albumin: 4 g/dL (ref 3.5–5.2)
Alkaline Phosphatase: 91 U/L (ref 39–117)
Bilirubin, Direct: 0.1 mg/dL (ref 0.0–0.3)
Total Bilirubin: 0.7 mg/dL (ref 0.3–1.2)
Total Protein: 6.8 g/dL (ref 6.0–8.3)

## 2011-01-03 LAB — BASIC METABOLIC PANEL
BUN: 15 mg/dL (ref 6–23)
CO2: 24 mEq/L (ref 19–32)
Calcium: 9.1 mg/dL (ref 8.4–10.5)
Chloride: 106 mEq/L (ref 96–112)
Creatinine, Ser: 1.2 mg/dL (ref 0.4–1.2)
GFR: 52.24 mL/min — ABNORMAL LOW (ref >60.00)
Glucose, Bld: 291 mg/dL — ABNORMAL HIGH (ref 70–99)
Potassium: 4 mEq/L (ref 3.5–5.1)
Sodium: 139 mEq/L (ref 135–145)

## 2011-01-03 LAB — LIPID PANEL
Cholesterol: 217 mg/dL — ABNORMAL HIGH (ref 0–200)
HDL: 38 mg/dL — ABNORMAL LOW (ref >39.00)
Total CHOL/HDL Ratio: 6
Triglycerides: 317 mg/dL — ABNORMAL HIGH (ref 0.0–149.0)
VLDL: 63.4 mg/dL — ABNORMAL HIGH (ref 0.0–40.0)

## 2011-01-03 LAB — HEMOGLOBIN A1C: Hgb A1c MFr Bld: 10.3 % — ABNORMAL HIGH (ref 4.6–6.5)

## 2011-01-03 LAB — TSH: TSH: 30.92 u[IU]/mL — ABNORMAL HIGH (ref 0.35–5.50)

## 2011-01-03 LAB — LDL CHOLESTEROL, DIRECT: Direct LDL: 140.6 mg/dL

## 2011-01-03 NOTE — Progress Notes (Signed)
Subjective:    Patient ID: Heather Dawson, female    DOB: 05-14-1955, 55 y.o.   MRN: [id number]  HPI  Here for wellness and f/u;  Overall doing ok;  Pt denies CP, worsening SOB, DOE, wheezing, orthopnea, PND, worsening LE edema, palpitations, dizziness or syncope.  Pt denies neurological change such as new Headache, facial or extremity weakness.  Pt denies polydipsia, polyuria, or low sugar symptoms. Pt states overall good compliance with treatment and medications, good tolerability, and trying to follow lower cholesterol diet.  Pt denies worsening depressive symptoms, suicidal ideation or panic. No fever, wt loss, night sweats, loss of appetite, or other constitutional symptoms.  Pt states good ability with ADL's, low fall risk, home safety reviewed and adequate, no significant changes in hearing or vision, and occasionally active with exercise.  Has ongoing right cervical radiculopathy and right shoudler pain as well, due for right shoudler and c-spine MRI soon per Dr Marcelino Freestone Past Medical History  Diagnosis Date  . Abdominal pain, epigastric 12/20/2006  . ALLERGIC RHINITIS 12/25/2006  . ANXIETY 10/16/2006  . BACK PAIN 06/15/2008  . BRADYCARDIA 05/04/2008  . Carpal tunnel syndrome 10/16/2006  . Cellulitis and abscess of other specified site 06/15/2008  . CHEST PAIN 09/08/2009  . CIRRHOSIS 10/16/2006  . COMMON MIGRAINE 12/20/2006  . CONTUSION, LOWER LEG 09/15/2008  . DEPRESSION 10/16/2006  . Esophageal reflux 12/25/2006  . HYPERLIPIDEMIA 12/25/2006  . HYPERTENSION 08/15/2006  . HYPOTHYROIDISM 08/15/2006  . MIGRAINE HEADACHE 10/16/2006  . Morbid obesity 10/16/2006  . OVARIAN CYST 12/20/2006  . SHINGLES, HX OF 12/20/2006  . TRANSIENT ISCHEMIC ATTACKS, HX OF 12/20/2006  . VITAMIN B12 DEFICIENCY 01/09/2007  . VERTIGO 09/15/2007  . Psoriasis 01/03/2011   Past Surgical History  Procedure Date  . Abdominal hysterectomy 02/2006  . Cholecystectomy   . Neck surgery     reports that she has quit smoking. She  does not have any smokeless tobacco history on file. She reports that she does not drink alcohol or use illicit drugs. family history includes Diabetes in her other and Hypertension in her other. Allergies  Allergen Reactions  . Cefuroxime Axetil     REACTION: edema/swelling   Current Outpatient Prescriptions on File Prior to Visit  Medication Sig Dispense Refill  . amLODipine (NORVASC) 10 MG tablet Take 10 mg by mouth daily.        Marland Kitchen aspirin 325 MG EC tablet Take 325 mg by mouth daily.        . cloNIDine (CATAPRES) 0.1 MG tablet Take 0.1 mg by mouth 2 (two) times daily.        . furosemide (LASIX) 40 MG tablet Take 40 mg by mouth daily as needed.        Marland Kitchen levothyroxine (SYNTHROID, LEVOTHROID) 125 MCG tablet TAKE ONE TABLET BY MOUTH EVERY DAY  90 tablet  0  . meclizine (ANTIVERT) 12.5 MG tablet Take 12.5 mg by mouth every 6 (six) hours as needed.        . Omega-3 Fatty Acids (FISH OIL) 1000 MG CAPS Take by mouth daily.        Marland Kitchen omeprazole (PRILOSEC) 20 MG capsule Take 1 capsule (20 mg total) by mouth 2 (two) times daily.  60 capsule  9  . simvastatin (ZOCOR) 80 MG tablet Take 80 mg by mouth every other day.        . b complex vitamins tablet Take 1 tablet by mouth daily.        Marland Kitchen FLUoxetine (PROZAC)  20 MG tablet Take 20 mg by mouth daily.        . Multiple Vitamin (MULTIVITAMIN) tablet Take 1 tablet by mouth daily.        . vitamin D, CHOLECALCIFEROL, 400 UNITS tablet Take 400 Units by mouth daily.         Review of Systems Review of Systems  Constitutional: Negative for diaphoresis, activity change, appetite change and unexpected weight change.  HENT: Negative for hearing loss, ear pain, facial swelling, mouth sores and neck stiffness.   Eyes: Negative for pain, redness and visual disturbance.  Respiratory: Negative for shortness of breath and wheezing.   Cardiovascular: Negative for chest pain and palpitations.  Gastrointestinal: Negative for diarrhea, blood in stool, abdominal  distention and rectal pain.  Genitourinary: Negative for hematuria, flank pain and decreased urine volume.  Musculoskeletal: Negative for myalgias and joint swelling.  Skin: Negative for color change and wound.  Neurological: Negative for syncope and numbness.  Hematological: Negative for adenopathy.  Psychiatric/Behavioral: Negative for hallucinations, self-injury, decreased concentration and agitation.      Objective:   Physical Exam BP 130/78  Pulse 79  Temp(Src) 98.1 F (36.7 C) (Oral)  Ht 5\' 2"  (1.575 m)  Wt 215 lb 8 oz (97.75 kg)  BMI 39.42 kg/m2  SpO2 97% Physical Exam  VS noted Constitutional: Pt is oriented to person, place, and time. Appears well-developed and well-nourished.  HENT:  Head: Normocephalic and atraumatic.  Right Ear: External ear normal.  Left Ear: External ear normal.  Nose: Nose normal.  Mouth/Throat: Oropharynx is clear and moist.  Eyes: Conjunctivae and EOM are normal. Pupils are equal, round, and reactive to light.  Neck: Normal range of motion. Neck supple. No JVD present. No tracheal deviation present.  Cardiovascular: Normal rate, regular rhythm, normal heart sounds and intact distal pulses.   Pulmonary/Chest: Effort normal and breath sounds normal.  Abdominal: Soft. Bowel sounds are normal. There is no tenderness.  Musculoskeletal: Normal range of motion. Exhibits no edema.  Lymphadenopathy:  Has no cervical adenopathy.  Neurological: Pt is alert and oriented to person, place, and time. Pt has normal reflexes. No cranial nerve deficit.  Skin: Skin is warm and dry. No rash noted.  Psychiatric:  Has  normal mood and affect. Behavior is normal.     Assessment & Plan:

## 2011-01-03 NOTE — Assessment & Plan Note (Signed)
Diet controlled, stable overall by hx and exam, most recent data reviewed with pt, and pt to continue medical treatment as before  Lab Results  Component Value Date   HGBA1C 6.4 08/26/2009

## 2011-01-03 NOTE — Assessment & Plan Note (Addendum)
Overall doing well, age appropriate education and counseling updated, referrals for preventative services and immunizations addressed, dietary and smoking counseling addressed, most recent labs and ECG reviewed.  I have personally reviewed and have noted: 1) the patient's medical and social history 2) The pt's use of alcohol, tobacco, and illicit drugs 3) The patient's current medications and supplements 4) Functional ability including ADL's, fall risk, home safety risk, hearing and visual impairment 5) Diet and physical activities 6) Evidence for depression or mood disorder 7) The patient's height, weight, and BMI have been recorded in the chart I have made referrals, and provided counseling and education based on review of the above For labs today, o/w up to date with prevention ECG reviewed as per emr

## 2011-01-03 NOTE — Patient Instructions (Addendum)
Continue all other medications as before Please go to LAB in the Basement for the blood and/or urine tests to be done today Please call the phone number [phone] (the [place]) for results of testing in 2-3 days;  When calling, simply dial the number, and when prompted enter the MRN number above (the Medical Record Number) and the # key, then the message should start. You are otherwise up to date with prevention Please have the pharmacy call if you need refills You had the flu shot today You are given the work note today Please return in 6 mo with Lab testing done 3-5 days before

## 2011-01-04 ENCOUNTER — Telehealth: Payer: Self-pay | Admitting: Internal Medicine

## 2011-01-04 DIAGNOSIS — IMO0001 Reserved for inherently not codable concepts without codable children: Secondary | ICD-10-CM

## 2011-01-04 DIAGNOSIS — Z79899 Other long term (current) drug therapy: Secondary | ICD-10-CM

## 2011-01-04 LAB — MICROALBUMIN / CREATININE URINE RATIO
Creatinine,U: 111.4 mg/dL
Microalb Creat Ratio: 9.2 mg/g (ref 0.0–30.0)
Microalb, Ur: 10.2 mg/dL — ABNORMAL HIGH (ref 0.0–1.9)

## 2011-01-04 MED ORDER — SITAGLIPTIN PHOS-METFORMIN HCL 50-500 MG PO TABS: 1.0000 | ORAL_TABLET | Freq: Two times a day (BID) | ORAL | Status: DC

## 2011-01-04 MED ORDER — ATORVASTATIN CALCIUM 40 MG PO TABS: 40.0000 mg | ORAL_TABLET | Freq: Every day | ORAL | Status: DC

## 2011-01-04 MED ORDER — LEVOTHYROXINE SODIUM 150 MCG PO TABS: 150.0000 ug | ORAL_TABLET | Freq: Every day | ORAL | Status: DC

## 2011-01-04 NOTE — Telephone Encounter (Signed)
Called and informed the patients husband [name] of results and medication changes.

## 2011-01-04 NOTE — Telephone Encounter (Signed)
Robin to contact pt  Synthroid to increase to 150 Stop the zocor (not working well) Start the lipitor 40 mg Start the [employer] 50/500 bid  Instead of returning in 6 mo, please return in 3 mo with labs prior

## 2011-01-05 ENCOUNTER — Other Ambulatory Visit: Payer: Self-pay | Admitting: Neurosurgery

## 2011-01-05 DIAGNOSIS — M4712 Other spondylosis with myelopathy, cervical region: Secondary | ICD-10-CM

## 2011-01-10 ENCOUNTER — Ambulatory Visit
Admission: RE | Admit: 2011-01-10 | Discharge: 2011-01-10 | Disposition: A | Discharge status: Home / Self Care | Payer: BC Managed Care – PPO | Source: Ambulatory Visit | Attending: Neurosurgery | Admitting: Neurosurgery

## 2011-01-10 DIAGNOSIS — M4712 Other spondylosis with myelopathy, cervical region: Secondary | ICD-10-CM

## 2011-01-17 ENCOUNTER — Telehealth: Payer: Self-pay

## 2011-01-17 MED ORDER — PIOGLITAZONE HCL 45 MG PO TABS: 45.0000 mg | ORAL_TABLET | Freq: Every day | ORAL | Status: DC

## 2011-01-17 NOTE — Telephone Encounter (Signed)
Ok to change to actos 45 mg 1 per day, generic ok

## 2011-01-17 NOTE — Telephone Encounter (Signed)
Called the patient left message to call back 

## 2011-01-17 NOTE — Telephone Encounter (Signed)
The patient was notified from the pharmacy that [place] was #[phone], she will need alternative as is too expensive.

## 2011-01-17 NOTE — Telephone Encounter (Signed)
The patient wanted to inform [place] she had been off her thyroid medication 10 days prior to her last labs.  She has not started the lipitor or Janumet that was recommended on 01/04/11 note. She has started the new dosage of thyroid medication. The patient wanted to know if she should come in sooner than 3 months or schedule as requested on 01/04/2011. ALSO she has a sinus infection and is requesting antibiotics???

## 2011-01-17 NOTE — Telephone Encounter (Signed)
Patient informed. 

## 2011-01-17 NOTE — Telephone Encounter (Signed)
I re-reviewed her labs in light of this information  Still ok to make changes as detailed before  OK to come back in 6 wks if she wants, as she would be at the new "level" with respect to current med use by then

## 2011-01-18 NOTE — Telephone Encounter (Signed)
Patient informed. 

## 2011-02-18 ENCOUNTER — Other Ambulatory Visit: Payer: Self-pay | Admitting: Internal Medicine

## 2011-03-02 ENCOUNTER — Other Ambulatory Visit (INDEPENDENT_AMBULATORY_CARE_PROVIDER_SITE_OTHER): Payer: BC Managed Care – PPO

## 2011-03-02 ENCOUNTER — Encounter: Payer: Self-pay | Admitting: Internal Medicine

## 2011-03-02 ENCOUNTER — Ambulatory Visit (INDEPENDENT_AMBULATORY_CARE_PROVIDER_SITE_OTHER): Payer: BC Managed Care – PPO | Admitting: Internal Medicine

## 2011-03-02 ENCOUNTER — Ambulatory Visit: Payer: BC Managed Care – PPO

## 2011-03-02 VITALS — BP 112/70 | HR 66 | Temp 98.6°F | Ht 62.0 in | Wt 216.1 lb

## 2011-03-02 DIAGNOSIS — E119 Type 2 diabetes mellitus without complications: Secondary | ICD-10-CM

## 2011-03-02 DIAGNOSIS — E785 Hyperlipidemia, unspecified: Secondary | ICD-10-CM

## 2011-03-02 DIAGNOSIS — I10 Essential (primary) hypertension: Secondary | ICD-10-CM

## 2011-03-02 DIAGNOSIS — R252 Cramp and spasm: Secondary | ICD-10-CM

## 2011-03-02 DIAGNOSIS — Z79899 Other long term (current) drug therapy: Secondary | ICD-10-CM

## 2011-03-02 LAB — BASIC METABOLIC PANEL
BUN: 27 mg/dL — ABNORMAL HIGH (ref 6–23)
CO2: 26 mEq/L (ref 19–32)
Calcium: 9.1 mg/dL (ref 8.4–10.5)
Chloride: 108 mEq/L (ref 96–112)
Creatinine, Ser: 1.3 mg/dL — ABNORMAL HIGH (ref 0.4–1.2)
GFR: 44.15 mL/min — ABNORMAL LOW (ref >60.00)
Glucose, Bld: 138 mg/dL — ABNORMAL HIGH (ref 70–99)
Potassium: 4.7 mEq/L (ref 3.5–5.1)
Sodium: 141 mEq/L (ref 135–145)

## 2011-03-02 LAB — HEPATIC FUNCTION PANEL
ALT: 33 U/L (ref 0–35)
AST: 22 U/L (ref 0–37)
Albumin: 3.8 g/dL (ref 3.5–5.2)
Alkaline Phosphatase: 68 U/L (ref 39–117)
Bilirubin, Direct: 0.1 mg/dL (ref 0.0–0.3)
Total Bilirubin: 0.7 mg/dL (ref 0.3–1.2)
Total Protein: 6.5 g/dL (ref 6.0–8.3)

## 2011-03-02 LAB — LIPID PANEL
Cholesterol: 171 mg/dL (ref 0–200)
HDL: 49.6 mg/dL (ref >39.00)
LDL Cholesterol: 104 mg/dL — ABNORMAL HIGH (ref 0–99)
Total CHOL/HDL Ratio: 3
Triglycerides: 88 mg/dL (ref 0.0–149.0)
VLDL: 17.6 mg/dL (ref 0.0–40.0)

## 2011-03-02 MED ORDER — LANCETS MISC: 1.0000 "application " | Freq: Every day | Status: DC

## 2011-03-02 MED ORDER — GLUCOSE BLOOD VI STRP: ORAL_STRIP | Status: AC

## 2011-03-02 NOTE — Patient Instructions (Addendum)
Continue all other medications as before Please go to LAB in the Basement for the blood and/or urine tests to be done today Please call the phone number [phone] (the [place]) for results of testing in 2-3 days;  When calling, simply dial the number, and when prompted enter the MRN number above (the Medical Record Number) and the # key, then the message should start. Please check your sugar once daily as we discussed Robin to show how to check sugar today You will be contacted regarding the referral for: Diabetic Education Please call if you have further low sugars, as you may need less actos to 30 mg Please return in 6 mo with Lab testing done 3-5 days before

## 2011-03-03 LAB — HEMOGLOBIN A1C
Hgb A1c MFr Bld: 9.7 % — ABNORMAL HIGH (ref <5.7)
Mean Plasma Glucose: 232 mg/dL — ABNORMAL HIGH (ref <117)

## 2011-03-04 NOTE — Progress Notes (Signed)
Subjective:    Patient ID: Heather Dawson, female    DOB: 1955-10-22, 56 y.o.   MRN: [id number]  HPI  Here to f/u; overall doing ok,  Pt denies chest pain, increased sob or doe, wheezing, orthopnea, PND, increased LE swelling, palpitations, dizziness or syncope.  Pt denies new neurological symptoms such as new headache, or facial or extremity weakness or numbness   Pt denies polydipsia, polyuria, or low sugar symptoms such as weakness or confusion improved with po intake.  Pt states overall good compliance with meds, trying to follow lower cholesterol, diabetic diet, wt overall stable but little exercise however.  a1c normal approx 1 yrs ago, further hx indicates pt wtihout work and much less active for over 6 mo adn admits dietary noncomplaicne.  Now back to work, good compliacne with med, believes the can do better with diet as well.  Does have occas leg cramp at night Past Medical History  Diagnosis Date  . Abdominal pain, epigastric 12/20/2006  . ALLERGIC RHINITIS 12/25/2006  . ANXIETY 10/16/2006  . BACK PAIN 06/15/2008  . BRADYCARDIA 05/04/2008  . Carpal tunnel syndrome 10/16/2006  . Cellulitis and abscess of other specified site 06/15/2008  . CHEST PAIN 09/08/2009  . CIRRHOSIS 10/16/2006  . COMMON MIGRAINE 12/20/2006  . CONTUSION, LOWER LEG 09/15/2008  . DEPRESSION 10/16/2006  . Esophageal reflux 12/25/2006  . HYPERLIPIDEMIA 12/25/2006  . HYPERTENSION 08/15/2006  . HYPOTHYROIDISM 08/15/2006  . MIGRAINE HEADACHE 10/16/2006  . Morbid obesity 10/16/2006  . OVARIAN CYST 12/20/2006  . SHINGLES, HX OF 12/20/2006  . TRANSIENT ISCHEMIC ATTACKS, HX OF 12/20/2006  . VITAMIN B12 DEFICIENCY 01/09/2007  . VERTIGO 09/15/2007  . Psoriasis 01/03/2011   Past Surgical History  Procedure Date  . Abdominal hysterectomy 02/2006  . Cholecystectomy   . Neck surgery     reports that she has quit smoking. She does not have any smokeless tobacco history on file. She reports that she does not drink alcohol or use  illicit drugs. family history includes Diabetes in her other and Hypertension in her other. Allergies  Allergen Reactions  . Cefuroxime Axetil     REACTION: edema/swelling   Current Outpatient Prescriptions on File Prior to Visit  Medication Sig Dispense Refill  . amLODipine (NORVASC) 10 MG tablet TAKE ONE TABLET BY MOUTH EVERY DAY  90 tablet  3  . aspirin 325 MG EC tablet Take 325 mg by mouth daily.        Marland Kitchen atorvastatin (LIPITOR) 40 MG tablet Take 1 tablet (40 mg total) by mouth daily.  90 tablet  3  . cloNIDine (CATAPRES) 0.1 MG tablet Take 0.1 mg by mouth 2 (two) times daily.        . furosemide (LASIX) 40 MG tablet Take 40 mg by mouth daily as needed.        Marland Kitchen levothyroxine (SYNTHROID) 150 MCG tablet Take 1 tablet (150 mcg total) by mouth daily.  90 tablet  3  . meclizine (ANTIVERT) 12.5 MG tablet Take 12.5 mg by mouth every 6 (six) hours as needed.        . Omega-3 Fatty Acids (FISH OIL) 1000 MG CAPS Take by mouth daily.        Marland Kitchen omeprazole (PRILOSEC) 20 MG capsule Take 1 capsule (20 mg total) by mouth 2 (two) times daily.  60 capsule  9  . pioglitazone (ACTOS) 45 MG tablet Take 1 tablet (45 mg total) by mouth daily.  30 tablet  11   Review of Systems  Review of Systems  Constitutional: Negative for diaphoresis and unexpected weight change.  HENT: Negative for drooling and tinnitus.   Eyes: Negative for photophobia and visual disturbance.  Respiratory: Negative for choking and stridor.   Gastrointestinal: Negative for vomiting and blood in stool.  Genitourinary: Negative for hematuria and decreased urine volume.     Objective:   Physical Exam BP 112/70  Pulse 66  Temp(Src) 98.6 F (37 C) (Oral)  Ht 5\' 2"  (1.575 m)  Wt 216 lb 2 oz (98.034 kg)  BMI 39.53 kg/m2  SpO2 97% Physical Exam  VS noted Constitutional: Pt appears well-developed and well-nourished.  HENT: Head: Normocephalic.  Right Ear: External ear normal.  Left Ear: External ear normal.  Eyes: Conjunctivae and  EOM are normal. Pupils are equal, round, and reactive to light.  Neck: Normal range of motion. Neck supple.  Cardiovascular: Normal rate and regular rhythm.   Pulmonary/Chest: Effort normal and breath sounds normal.  Abd:  Soft, NT, non-distended, + BS Neurological: Pt is alert. No cranial nerve deficit.  Skin: Skin is warm. No erythema.  Psychiatric: Pt behavior is normal. Thought content normal.     Assessment & Plan:

## 2011-03-04 NOTE — Assessment & Plan Note (Signed)
Recurring issue, not clear related to lipitor, to consider lipitor holiday for 4 wks to eval for cramp resolution

## 2011-03-04 NOTE — Assessment & Plan Note (Signed)
stable overall by hx and exam, most recent data reviewed with pt, and pt to continue medical treatment as before  BP Readings from Last 3 Encounters:  03/02/11 112/70  01/03/11 130/78  12/29/09 112/70

## 2011-03-04 NOTE — Assessment & Plan Note (Signed)
uncontroleld recent, with 2 low sugar recently, to cont med as is, work on diet and activity compliance, check a1c, f/u as well next visit

## 2011-03-04 NOTE — Assessment & Plan Note (Signed)
stable overall by hx and exam, most recent data reviewed with pt, and pt to continue medical treatment as before Lab Results  Component Value Date   LDLCALC 104* 03/02/2011

## 2011-03-31 ENCOUNTER — Other Ambulatory Visit: Payer: Self-pay | Admitting: Internal Medicine

## 2011-06-18 ENCOUNTER — Encounter: Payer: Self-pay | Admitting: Internal Medicine

## 2011-06-18 ENCOUNTER — Ambulatory Visit (INDEPENDENT_AMBULATORY_CARE_PROVIDER_SITE_OTHER): Payer: BC Managed Care – PPO | Admitting: Internal Medicine

## 2011-06-18 ENCOUNTER — Ambulatory Visit (INDEPENDENT_AMBULATORY_CARE_PROVIDER_SITE_OTHER)
Admission: RE | Admit: 2011-06-18 | Discharge: 2011-06-18 | Disposition: A | Discharge status: Home / Self Care | Payer: BC Managed Care – PPO | Source: Ambulatory Visit | Attending: Internal Medicine | Admitting: Internal Medicine

## 2011-06-18 ENCOUNTER — Other Ambulatory Visit (INDEPENDENT_AMBULATORY_CARE_PROVIDER_SITE_OTHER): Payer: BC Managed Care – PPO

## 2011-06-18 VITALS — BP 112/78 | HR 63 | Temp 98.2°F | Ht 62.0 in | Wt 218.5 lb

## 2011-06-18 DIAGNOSIS — R079 Chest pain, unspecified: Secondary | ICD-10-CM

## 2011-06-18 DIAGNOSIS — M25511 Pain in right shoulder: Secondary | ICD-10-CM

## 2011-06-18 DIAGNOSIS — M25519 Pain in unspecified shoulder: Secondary | ICD-10-CM

## 2011-06-18 DIAGNOSIS — E119 Type 2 diabetes mellitus without complications: Secondary | ICD-10-CM

## 2011-06-18 LAB — LIPID PANEL
Cholesterol: 241 mg/dL — ABNORMAL HIGH (ref 0–200)
HDL: 41.9 mg/dL (ref >39.00)
Total CHOL/HDL Ratio: 6
Triglycerides: 176 mg/dL — ABNORMAL HIGH (ref 0.0–149.0)
VLDL: 35.2 mg/dL (ref 0.0–40.0)

## 2011-06-18 LAB — BASIC METABOLIC PANEL
BUN: 22 mg/dL (ref 6–23)
CO2: 25 mEq/L (ref 19–32)
Calcium: 9.2 mg/dL (ref 8.4–10.5)
Chloride: 109 mEq/L (ref 96–112)
Creatinine, Ser: 1.4 mg/dL — ABNORMAL HIGH (ref 0.4–1.2)
GFR: 41.22 mL/min — ABNORMAL LOW (ref >60.00)
Glucose, Bld: 113 mg/dL — ABNORMAL HIGH (ref 70–99)
Potassium: 4.5 mEq/L (ref 3.5–5.1)
Sodium: 141 mEq/L (ref 135–145)

## 2011-06-18 LAB — LDL CHOLESTEROL, DIRECT: Direct LDL: 171.8 mg/dL

## 2011-06-18 LAB — HEMOGLOBIN A1C: Hgb A1c MFr Bld: 6.2 % (ref 4.6–6.5)

## 2011-06-18 MED ORDER — TRAMADOL HCL 50 MG PO TABS: 50.0000 mg | ORAL_TABLET | Freq: Four times a day (QID) | ORAL | Status: AC | PRN

## 2011-06-18 NOTE — Patient Instructions (Addendum)
Take all new medications as prescribed - the tramadol for pain Continue all other medications as before You will be contacted regarding the referral for: MRI, and Dr Tonita Cong Please go to LAB in the Basement for the blood and/or urine tests to be done today You will be contacted by phone if any changes need to be made immediately.  Otherwise, you will receive a letter about your results with an explanation. Please return in 4 mo with Lab testing done 3-5 days before

## 2011-06-18 NOTE — Progress Notes (Signed)
Subjective:    Patient ID: Heather Dawson, female    DOB: 03/26/1955, 56 y.o.   MRN: [id number]  HPI    Here to f/u; has seen dr Stern/NS who felt hre c-spine was ok at that time (dec 2012), and pain more likely related to right shoudler, but MRI not approved since just had the c-spine;  Also had bruise spontaneous to right upper chest just at the tail of the breast, seems to radaite to the back, may have been bruised with playing with the grandkids, pain now severe and more constant, tends to go down the right arm as well but without numb or weakness; she has not seen  ortho so far as her husband has had several orthopedic issues and she has been trying to get byl; did have a cortisone shot per Dr Maryjean Ka as referred per  Dr Vertell Limber but did not seem to help to start with.  Has had breast exam and mammogram oct 2012 - neg.  Pt denies other chest pain, increased sob or doe, wheezing, orthopnea, PND, increased LE swelling, palpitations, dizziness or syncope.  Pt denies new neurological symptoms such as new headache, or facial or extremity weakness or numbness   Pt denies polydipsia, polyuria, or low sugar symptoms such as weakness or confusion improved with po intake.  Pt states overall good compliance with meds, trying to follow lower cholesterol, diabetic diet, wt overall stable but little exercise however due to pain above, and left heel plantar fasciits, has been wearing an air boot cast.   Past Medical History  Diagnosis Date  . Abdominal pain, epigastric 12/20/2006  . ALLERGIC RHINITIS 12/25/2006  . ANXIETY 10/16/2006  . BACK PAIN 06/15/2008  . BRADYCARDIA 05/04/2008  . Carpal tunnel syndrome 10/16/2006  . Cellulitis and abscess of other specified site 06/15/2008  . CHEST PAIN 09/08/2009  . CIRRHOSIS 10/16/2006  . COMMON MIGRAINE 12/20/2006  . CONTUSION, LOWER LEG 09/15/2008  . DEPRESSION 10/16/2006  . Esophageal reflux 12/25/2006  . HYPERLIPIDEMIA 12/25/2006  . HYPERTENSION 08/15/2006  . HYPOTHYROIDISM  08/15/2006  . MIGRAINE HEADACHE 10/16/2006  . Morbid obesity 10/16/2006  . OVARIAN CYST 12/20/2006  . SHINGLES, HX OF 12/20/2006  . TRANSIENT ISCHEMIC ATTACKS, HX OF 12/20/2006  . VITAMIN B12 DEFICIENCY 01/09/2007  . VERTIGO 09/15/2007  . Psoriasis 01/03/2011   Past Surgical History  Procedure Date  . Abdominal hysterectomy 02/2006  . Cholecystectomy   . Neck surgery     reports that she has quit smoking. She does not have any smokeless tobacco history on file. She reports that she does not drink alcohol or use illicit drugs. family history includes Diabetes in her other and Hypertension in her other. Allergies  Allergen Reactions  . Cefuroxime Axetil     REACTION: edema/swelling   Current Outpatient Prescriptions on File Prior to Visit  Medication Sig Dispense Refill  . amLODipine (NORVASC) 10 MG tablet TAKE ONE TABLET BY MOUTH EVERY DAY  90 tablet  3  . aspirin 325 MG EC tablet Take 325 mg by mouth daily.        Marland Kitchen atorvastatin (LIPITOR) 40 MG tablet Take 1 tablet (40 mg total) by mouth daily.  90 tablet  3  . cloNIDine (CATAPRES) 0.1 MG tablet TAKE ONE TABLET BY MOUTH TWICE DAILY  60 tablet  11  . furosemide (LASIX) 40 MG tablet Take 40 mg by mouth daily as needed.        Marland Kitchen glucose blood (ONE TOUCH ULTRA TEST) test strip  Use as instructed  100 each  12  . Lancets MISC 1 application by Does not apply route daily.  100 each  12  . levothyroxine (SYNTHROID) 150 MCG tablet Take 1 tablet (150 mcg total) by mouth daily.  90 tablet  3  . lisinopril (PRINIVIL,ZESTRIL) 10 MG tablet Take 10 mg by mouth daily.      . meclizine (ANTIVERT) 12.5 MG tablet Take 12.5 mg by mouth every 6 (six) hours as needed.        . Omega-3 Fatty Acids (FISH OIL) 1000 MG CAPS Take by mouth daily.        . pioglitazone (ACTOS) 45 MG tablet Take 1 tablet (45 mg total) by mouth daily.  30 tablet  11  . omeprazole (PRILOSEC) 20 MG capsule Take 1 capsule (20 mg total) by mouth 2 (two) times daily.  60 capsule  9    Review of Systems Review of Systems  Constitutional: Negative for diaphoresis and unexpected weight change.  Eyes: Negative for photophobia and visual disturbance.  Respiratory: Negative for choking and stridor.   Gastrointestinal: Negative for vomiting and blood in stool.  Genitourinary: Negative for hematuria and decreased urine volume. though would like urine checked with ? recnet frequency Musculoskeletal: Negative for gait problem.  Skin: Negative for color change and wound.  Neurological: Negative for tremors and numbness.  Psychiatric/Behavioral: Negative for decreased concentration. The patient is not hyperactive.      Objective:   Physical Exam BP 112/78  Pulse 63  Temp(Src) 98.2 F (36.8 C) (Oral)  Ht 5\' 2"  (1.575 m)  Wt 218 lb 8 oz (99.111 kg)  BMI 39.96 kg/m2  SpO2 97% Physical Exam  VS noted Constitutional: Pt appears well-developed and well-nourished.  HENT: Head: Normocephalic.  Right Ear: External ear normal.  Left Ear: External ear normal.  Eyes: Conjunctivae and EOM are normal. Pupils are equal, round, and reactive to light.  Neck: Normal range of motion. Neck supple.  Cardiovascular: Normal rate and regular rhythm.   Pulmonary/Chest: Effort normal and breath sounds normal.  Neurological: Pt is alert. No cranial nerve deficit. motor/dtr/gait intact Right shoulder with mild decr ROM, and marked pain to forward elev and abduction Skin: Skin is warm. No erythema.  Psychiatric: Pt behavior is normal. Thought content normal.  No chest wall tender or bruising anteriorly    Assessment & Plan:

## 2011-06-18 NOTE — Assessment & Plan Note (Signed)
?   Partial rot cuff tear vs other significant abnormal;  Given chronic now severe pain will ask for MRI, and refer ortho- dr Tonita Cong

## 2011-06-18 NOTE — Assessment & Plan Note (Signed)
Uncontrolled in past 6 mo,  Lab Results  Component Value Date   HGBA1C 9.7* 03/02/2011   Has ahd better diet, but not as active as before, wt stable,  May require further OHA - for a1c

## 2011-06-18 NOTE — Assessment & Plan Note (Signed)
Most likely referred pain, veyr atypical for cardiac and not concerning, will check cxr to r/o pulm

## 2011-06-19 ENCOUNTER — Telehealth: Payer: Self-pay | Admitting: Internal Medicine

## 2011-06-19 ENCOUNTER — Telehealth: Payer: Self-pay

## 2011-06-19 ENCOUNTER — Encounter: Payer: Self-pay | Admitting: Internal Medicine

## 2011-06-19 MED ORDER — ATORVASTATIN CALCIUM 80 MG PO TABS: 80.0000 mg | ORAL_TABLET | Freq: Every day | ORAL | Status: DC

## 2011-06-19 MED ORDER — ROSUVASTATIN CALCIUM 40 MG PO TABS: 40.0000 mg | ORAL_TABLET | Freq: Every day | ORAL | Status: DC

## 2011-06-19 NOTE — Telephone Encounter (Signed)
Pt called stating that Crestor is too expensive, $300+. Pt is requesting alternative medication, please advise.

## 2011-06-19 NOTE — Telephone Encounter (Signed)
Called left message to call back 

## 2011-06-19 NOTE — Telephone Encounter (Signed)
crestor done per [employer]

## 2011-06-19 NOTE — Telephone Encounter (Signed)
Called the patient informed of medication sent in to the pharmacy.

## 2011-06-19 NOTE — Telephone Encounter (Signed)
Next best is the lipitor 80 - done per emr

## 2011-06-19 NOTE — Telephone Encounter (Signed)
Called the patient and she would like to change to crestor now, please advise

## 2011-06-19 NOTE — Telephone Encounter (Signed)
Pt states has been taking the lipitor daily  LDL has actually increased  Ok to incr the lipitor to 80 mg, and f/u labs next visit, consider change to crestor if not improved  Robin to inform pt, I will do rx, and remind pt to follow low chol diet

## 2011-06-19 NOTE — Telephone Encounter (Signed)
Message copied by Biagio Borg on Tue Jun 19, 2011 10:29 AM ------      Message from: Sharon Seller B      Created: Tue Jun 19, 2011  8:53 AM       Called the patient and she has been taking Lipitor everyday.

## 2011-06-19 NOTE — Telephone Encounter (Signed)
Patient informed. 

## 2011-07-03 ENCOUNTER — Ambulatory Visit: Payer: BC Managed Care – PPO | Admitting: Internal Medicine

## 2011-07-19 ENCOUNTER — Telehealth: Payer: Self-pay | Admitting: Internal Medicine

## 2011-07-19 MED ORDER — GLIMEPIRIDE 4 MG PO TABS: 4.0000 mg | ORAL_TABLET | Freq: Every day | ORAL | Status: DC

## 2011-07-19 NOTE — Telephone Encounter (Signed)
Caller: Heather Dawson/Patient; Phone Number: [phone]; Message from caller: Pt is returning a call from [place].

## 2011-07-19 NOTE — Telephone Encounter (Signed)
The swelling may actually improve off the actos; ok to continue to follow this for now

## 2011-07-19 NOTE — Telephone Encounter (Signed)
Called the patient informed of medication change and MD instructions.  She also stated her feet have been swelling and left leg from the knee down. Please advise

## 2011-07-19 NOTE — Telephone Encounter (Signed)
Caller: Heather Dawson/Patient; PCP: [name]; CB#: [phone]; ; ; Call regarding Medication Inquiry;  Patient states she changed jobs and she no longer has insurance coverage. States he attempted to fill her Rx for Actos 45mg . daily. States cost was $174.00. Patient states Actos is not on the Pharmacy Formulary. Patient requesting medication change for Actos to a medication that is on the $4.00 or $10.00 Pharmacy Formulary.   RN spoke with Pharmacist/Sameer at [facility] on [address] @ [phone]. States that Glipizide, Metformin and Amaryl are available for cost of $4.00. PATIENT REQUESTING MEDICATION CHANGE FOR ACTOS DUE TO COST. PLEASE SEE ABOVE NOTE. Patient can be reached @ [phone].

## 2011-07-19 NOTE — Telephone Encounter (Signed)
Note on chart last creatinine 1.4 - apr 2013  To try change to glimeparide 4 mg qd - done per emr  Pt to cont to monitor blood sugars and call Mon next wk with results (or soon after that)

## 2011-07-19 NOTE — Telephone Encounter (Signed)
Called the patient left message to call back 

## 2011-07-20 NOTE — Telephone Encounter (Signed)
Patient informed. 

## 2011-07-23 ENCOUNTER — Telehealth: Payer: Self-pay | Admitting: Internal Medicine

## 2011-07-23 NOTE — Telephone Encounter (Signed)
Ok with me  To robin

## 2011-07-23 NOTE — Telephone Encounter (Signed)
Caller: Heather Dawson/Patient; PCP: [name]; CB#: [phone]. Call regarding Blood Sugar Readings. Caller reports her Blood Sugar was 106 before lunch on Sat 6/1 and 104 before supper on Sunday 6/2. She has not checked it yet today. Caller also has a question for Robin.  Caller reports she was given some lancets from the office when she got her machine because the ones with the machines hurt her fingers a lot. She is out of those and got the ones from [place] as advised and they hurt a lot and cause her fingers to bleed and bruise. Caller asking if she can come by a pick up a few so she can try to find that kind that do not hurt as much. Maybe Shirlean Mylar can provide her the # from box so she can look at [place]. Caller can be reached at above #.

## 2011-07-24 NOTE — Telephone Encounter (Signed)
This could be related to her known kidney slowing, cirrhosis, or other such as varicose veins worsening - consider OV if persists or worsens

## 2011-07-24 NOTE — Telephone Encounter (Signed)
Called the patient and left samples of the lancets requested at the front desk.  The patient wanted to inform the MD that her feet and ankles are swelling, please advise.

## 2011-07-25 NOTE — Telephone Encounter (Signed)
Called the patient informed of MD's instructions on swelling.

## 2011-08-02 ENCOUNTER — Other Ambulatory Visit: Payer: Self-pay | Admitting: Internal Medicine

## 2011-08-02 DIAGNOSIS — I120 Hypertensive chronic kidney disease with stage 5 chronic kidney disease or end stage renal disease: Secondary | ICD-10-CM

## 2011-08-02 DIAGNOSIS — N189 Chronic kidney disease, unspecified: Secondary | ICD-10-CM

## 2011-08-02 DIAGNOSIS — R809 Proteinuria, unspecified: Secondary | ICD-10-CM

## 2011-08-02 DIAGNOSIS — N182 Chronic kidney disease, stage 2 (mild): Secondary | ICD-10-CM

## 2011-08-04 ENCOUNTER — Other Ambulatory Visit: Payer: Self-pay | Admitting: Internal Medicine

## 2011-08-28 ENCOUNTER — Telehealth: Payer: Self-pay | Admitting: Internal Medicine

## 2011-08-28 NOTE — Telephone Encounter (Signed)
It is up to her, but I seem to recall the labs for urine and kidney function were specifically asked by her nephrologist to be done here

## 2011-08-28 NOTE — Telephone Encounter (Signed)
Caller: Haylie/Patient; Phone Number: [phone]; Message from caller: Called to speak with Shirlean Mylar re lab request sent by nephrologist, Dr Larinda Buttery on 08/02/11.  States she never notification from Robin/ office to come for lab test.  Please call back.

## 2011-08-28 NOTE — Telephone Encounter (Signed)
Called the patient and labs are ordered from 08/02/11 please advise if she needs to come in and have done.  Also the patient wanted to remind MD she does not have insurance at this time!

## 2011-08-29 NOTE — Telephone Encounter (Signed)
Called informed the patient of MD's instructions.

## 2011-09-04 ENCOUNTER — Ambulatory Visit: Payer: BC Managed Care – PPO | Admitting: Internal Medicine

## 2011-10-19 ENCOUNTER — Ambulatory Visit: Payer: BC Managed Care – PPO | Admitting: Internal Medicine

## 2012-01-01 ENCOUNTER — Other Ambulatory Visit: Payer: Self-pay | Admitting: Internal Medicine

## 2012-01-01 ENCOUNTER — Encounter: Payer: Self-pay | Admitting: Internal Medicine

## 2012-01-01 ENCOUNTER — Ambulatory Visit (INDEPENDENT_AMBULATORY_CARE_PROVIDER_SITE_OTHER): Payer: Self-pay | Admitting: Internal Medicine

## 2012-01-01 ENCOUNTER — Other Ambulatory Visit (INDEPENDENT_AMBULATORY_CARE_PROVIDER_SITE_OTHER): Payer: Self-pay

## 2012-01-01 VITALS — BP 114/70 | HR 46 | Temp 97.5°F | Ht 62.0 in | Wt 195.2 lb

## 2012-01-01 DIAGNOSIS — I10 Essential (primary) hypertension: Secondary | ICD-10-CM

## 2012-01-01 DIAGNOSIS — B370 Candidal stomatitis: Secondary | ICD-10-CM

## 2012-01-01 DIAGNOSIS — E039 Hypothyroidism, unspecified: Secondary | ICD-10-CM

## 2012-01-01 DIAGNOSIS — E119 Type 2 diabetes mellitus without complications: Secondary | ICD-10-CM

## 2012-01-01 LAB — BASIC METABOLIC PANEL
BUN: 22 mg/dL (ref 6–23)
CO2: 26 mEq/L (ref 19–32)
Calcium: 9.2 mg/dL (ref 8.4–10.5)
Chloride: 106 mEq/L (ref 96–112)
Creatinine, Ser: 1.3 mg/dL — ABNORMAL HIGH (ref 0.4–1.2)
GFR: 46.84 mL/min — ABNORMAL LOW (ref >60.00)
Glucose, Bld: 81 mg/dL (ref 70–99)
Potassium: 4.2 mEq/L (ref 3.5–5.1)
Sodium: 140 mEq/L (ref 135–145)

## 2012-01-01 LAB — LIPID PANEL
Cholesterol: 187 mg/dL (ref 0–200)
HDL: 30.9 mg/dL — ABNORMAL LOW (ref >39.00)
LDL Cholesterol: 120 mg/dL — ABNORMAL HIGH (ref 0–99)
Total CHOL/HDL Ratio: 6
Triglycerides: 180 mg/dL — ABNORMAL HIGH (ref 0.0–149.0)
VLDL: 36 mg/dL (ref 0.0–40.0)

## 2012-01-01 LAB — HEMOGLOBIN A1C: Hgb A1c MFr Bld: 5.8 % (ref 4.6–6.5)

## 2012-01-01 LAB — TSH: TSH: 0.06 u[IU]/mL — ABNORMAL LOW (ref 0.35–5.50)

## 2012-01-01 MED ORDER — LEVOTHYROXINE SODIUM 150 MCG PO TABS: 150.0000 ug | ORAL_TABLET | Freq: Every day | ORAL | Status: DC

## 2012-01-01 MED ORDER — LANCETS MISC: 1.0000 "application " | Freq: Every day | Status: DC

## 2012-01-01 MED ORDER — LEVOTHYROXINE SODIUM 112 MCG PO TABS: 112.0000 ug | ORAL_TABLET | Freq: Every day | ORAL | Status: DC

## 2012-01-01 MED ORDER — NYSTATIN 100000 UNIT/ML MT SUSP: 500000.0000 [IU] | Freq: Four times a day (QID) | OROMUCOSAL | Status: DC

## 2012-01-01 NOTE — Progress Notes (Signed)
Subjective:    Patient ID: Heather Dawson, female    DOB: 09-Feb-1956, 56 y.o.   MRN: [id number]  HPI  Here to f/u; overall doing ok,  Pt denies chest pain, increased sob or doe, wheezing, orthopnea, PND, increased LE swelling, palpitations, dizziness or syncope.  Pt denies new neurological symptoms such as new headache, or facial or extremity weakness or numbness   Pt denies polydipsia, polyuria, or low sugar symptoms such as weakness or confusion improved with po intake.  Pt states overall good compliance with meds, trying to follow lower cholesterol, diabetic diet, wt overall stable but little exercise however.  Also husbnad with recent mouth infection, sounds like gingivitis, tx with pcn.  Denies hyper or hypo thyroid symptoms such as voice, skin or hair change.  She has whitish rash to tongue. Lost 34 lbs, was able to stop the amaryl, amlodipine, and lipitor trying to follow better diet Past Medical History  Diagnosis Date  . Abdominal pain, epigastric 12/20/2006  . ALLERGIC RHINITIS 12/25/2006  . ANXIETY 10/16/2006  . BACK PAIN 06/15/2008  . BRADYCARDIA 05/04/2008  . Carpal tunnel syndrome 10/16/2006  . Cellulitis and abscess of other specified site 06/15/2008  . CHEST PAIN 09/08/2009  . CIRRHOSIS 10/16/2006  . COMMON MIGRAINE 12/20/2006  . CONTUSION, LOWER LEG 09/15/2008  . DEPRESSION 10/16/2006  . Esophageal reflux 12/25/2006  . HYPERLIPIDEMIA 12/25/2006  . HYPERTENSION 08/15/2006  . HYPOTHYROIDISM 08/15/2006  . MIGRAINE HEADACHE 10/16/2006  . Morbid obesity 10/16/2006  . OVARIAN CYST 12/20/2006  . SHINGLES, HX OF 12/20/2006  . TRANSIENT ISCHEMIC ATTACKS, HX OF 12/20/2006  . VITAMIN B12 DEFICIENCY 01/09/2007  . VERTIGO 09/15/2007  . Psoriasis 01/03/2011   Past Surgical History  Procedure Date  . Abdominal hysterectomy 02/2006  . Cholecystectomy   . Neck surgery     reports that she has quit smoking. She does not have any smokeless tobacco history on file. She reports that she does not drink  alcohol or use illicit drugs. family history includes Diabetes in her other and Hypertension in her other. Allergies  Allergen Reactions  . Cefuroxime Axetil     REACTION: edema/swelling   Current Outpatient Prescriptions on File Prior to Visit  Medication Sig Dispense Refill  . aspirin 325 MG EC tablet Take 325 mg by mouth daily.        . cloNIDine (CATAPRES) 0.1 MG tablet TAKE ONE TABLET BY MOUTH TWICE DAILY  60 tablet  11  . glucose blood (ONE TOUCH ULTRA TEST) test strip Use as instructed  100 each  12  . lisinopril (PRINIVIL,ZESTRIL) 10 MG tablet Take 10 mg by mouth daily.       Review of Systems All otherwise neg per pt     Objective:   Physical Exam BP 114/70  Pulse 46  Temp 97.5 F (36.4 C) (Oral)  Ht 5\' 2"  (1.575 m)  Wt 195 lb 4 oz (88.565 kg)  BMI 35.71 kg/m2  SpO2 98% Physical Exam  VS noted Constitutional: Pt appears well-developed and well-nourished.  HENT: Head: Normocephalic.  Right Ear: External ear normal.  Left Ear: External ear normal.  Tongue with whitish coating, prob thrush Eyes: Conjunctivae and EOM are normal. Pupils are equal, round, and reactive to light.  Neck: Normal range of motion. Neck supple.  Cardiovascular: Normal rate and regular rhythm.   Pulmonary/Chest: Effort normal and breath sounds normal.  Abd:  Soft, NT, non-distended, + BS Neurological: Pt is alert. Not confused  Skin: Skin is warm. No  erythema.  Psychiatric: Pt behavior is normal. Thought content normal.     Assessment & Plan:

## 2012-01-01 NOTE — Assessment & Plan Note (Signed)
Has lost 34 lbs, able to stop the lasix and amlipdine, o/w stable overall by hx and exam, most recent data reviewed with pt, and pt to continue medical treatment as before Lab Results  Component Value Date   WBC 8.8 01/03/2011   HGB 14.4 01/03/2011   HCT 42.4 01/03/2011   PLT 206.0 01/03/2011   GLUCOSE 81 01/01/2012   CHOL 187 01/01/2012   TRIG 180.0* 01/01/2012   HDL 30.90* 01/01/2012   LDLDIRECT 171.8 06/18/2011   LDLCALC 120* 01/01/2012   ALT 33 03/02/2011   AST 22 03/02/2011   NA 140 01/01/2012   K 4.2 01/01/2012   CL 106 01/01/2012   CREATININE 1.3* 01/01/2012   BUN 22 01/01/2012   CO2 26 01/01/2012   TSH 0.06* 01/01/2012   INR 1.0 11/10/2007   HGBA1C 5.8 01/01/2012   MICROALBUR 10.2* 01/03/2011

## 2012-01-01 NOTE — Assessment & Plan Note (Signed)
stable overall by hx and exam, most recent data reviewed with pt, and pt to continue medical treatment as before, for f/u tsh today

## 2012-01-01 NOTE — Assessment & Plan Note (Signed)
[place] for nsytatin soln asd,  to f/u any worsening symptoms or concerns

## 2012-01-01 NOTE — Patient Instructions (Addendum)
Take all new medications as prescribed Continue all other medications as before Please go to LAB in the Basement for the blood and/or urine tests to be done today You will be contacted by phone if any changes need to be made immediately.  Otherwise, you will receive a letter about your results with an explanation. Please remember to sign up for My Chart at your earliest convenience, as this will be important to you in the future with finding out test results. Please return in 6 months, or sooner if needed

## 2012-01-01 NOTE — Assessment & Plan Note (Signed)
stable overall by hx and exam, most recent data reviewed with pt, and pt to continue medical treatment as before Lab Results  Component Value Date   HGBA1C 5.8 01/01/2012

## 2012-01-02 ENCOUNTER — Telehealth: Payer: Self-pay

## 2012-01-02 DIAGNOSIS — E038 Other specified hypothyroidism: Secondary | ICD-10-CM

## 2012-01-02 NOTE — Telephone Encounter (Signed)
Put lab order in for future TSH.

## 2012-04-18 ENCOUNTER — Emergency Department (HOSPITAL_COMMUNITY): Payer: Self-pay

## 2012-04-18 ENCOUNTER — Emergency Department (HOSPITAL_COMMUNITY)
Admission: EM | Admit: 2012-04-18 | Discharge: 2012-04-18 | Disposition: A | Discharge status: Home / Self Care | Payer: Self-pay | Attending: Emergency Medicine | Admitting: Emergency Medicine

## 2012-04-18 ENCOUNTER — Encounter (HOSPITAL_COMMUNITY): Payer: Self-pay | Admitting: *Deleted

## 2012-04-18 DIAGNOSIS — Z8619 Personal history of other infectious and parasitic diseases: Secondary | ICD-10-CM | POA: Insufficient documentation

## 2012-04-18 DIAGNOSIS — Z79899 Other long term (current) drug therapy: Secondary | ICD-10-CM | POA: Insufficient documentation

## 2012-04-18 DIAGNOSIS — Z8709 Personal history of other diseases of the respiratory system: Secondary | ICD-10-CM | POA: Insufficient documentation

## 2012-04-18 DIAGNOSIS — F411 Generalized anxiety disorder: Secondary | ICD-10-CM | POA: Insufficient documentation

## 2012-04-18 DIAGNOSIS — Z8742 Personal history of other diseases of the female genital tract: Secondary | ICD-10-CM | POA: Insufficient documentation

## 2012-04-18 DIAGNOSIS — Z8669 Personal history of other diseases of the nervous system and sense organs: Secondary | ICD-10-CM | POA: Insufficient documentation

## 2012-04-18 DIAGNOSIS — Z8659 Personal history of other mental and behavioral disorders: Secondary | ICD-10-CM | POA: Insufficient documentation

## 2012-04-18 DIAGNOSIS — E785 Hyperlipidemia, unspecified: Secondary | ICD-10-CM | POA: Insufficient documentation

## 2012-04-18 DIAGNOSIS — Z8719 Personal history of other diseases of the digestive system: Secondary | ICD-10-CM | POA: Insufficient documentation

## 2012-04-18 DIAGNOSIS — H538 Other visual disturbances: Secondary | ICD-10-CM | POA: Insufficient documentation

## 2012-04-18 DIAGNOSIS — E039 Hypothyroidism, unspecified: Secondary | ICD-10-CM | POA: Insufficient documentation

## 2012-04-18 DIAGNOSIS — R112 Nausea with vomiting, unspecified: Secondary | ICD-10-CM | POA: Insufficient documentation

## 2012-04-18 DIAGNOSIS — Z87891 Personal history of nicotine dependence: Secondary | ICD-10-CM | POA: Insufficient documentation

## 2012-04-18 DIAGNOSIS — Z8679 Personal history of other diseases of the circulatory system: Secondary | ICD-10-CM | POA: Insufficient documentation

## 2012-04-18 DIAGNOSIS — Z8673 Personal history of transient ischemic attack (TIA), and cerebral infarction without residual deficits: Secondary | ICD-10-CM | POA: Insufficient documentation

## 2012-04-18 DIAGNOSIS — Z7982 Long term (current) use of aspirin: Secondary | ICD-10-CM | POA: Insufficient documentation

## 2012-04-18 DIAGNOSIS — I10 Essential (primary) hypertension: Secondary | ICD-10-CM | POA: Insufficient documentation

## 2012-04-18 DIAGNOSIS — R51 Headache: Secondary | ICD-10-CM | POA: Insufficient documentation

## 2012-04-18 DIAGNOSIS — Z872 Personal history of diseases of the skin and subcutaneous tissue: Secondary | ICD-10-CM | POA: Insufficient documentation

## 2012-04-18 DIAGNOSIS — Z8639 Personal history of other endocrine, nutritional and metabolic disease: Secondary | ICD-10-CM | POA: Insufficient documentation

## 2012-04-18 DIAGNOSIS — H53149 Visual discomfort, unspecified: Secondary | ICD-10-CM | POA: Insufficient documentation

## 2012-04-18 LAB — CSF CELL COUNT WITH DIFFERENTIAL
RBC Count, CSF: 0 /mm3
RBC Count, CSF: 0 /mm3
Tube #: 1
Tube #: 4
WBC, CSF: 1 /mm3 (ref 0–5)
WBC, CSF: 1 /mm3 (ref 0–5)

## 2012-04-18 LAB — CBC WITH DIFFERENTIAL/PLATELET
Basophils Absolute: 0 10*3/uL (ref 0.0–0.1)
Basophils Relative: 0 % (ref 0–1)
Eosinophils Absolute: 0.3 10*3/uL (ref 0.0–0.7)
Eosinophils Relative: 4 % (ref 0–5)
HCT: 40 % (ref 36.0–46.0)
Hemoglobin: 14.4 g/dL (ref 12.0–15.0)
Lymphocytes Relative: 23 % (ref 12–46)
Lymphs Abs: 1.7 10*3/uL (ref 0.7–4.0)
MCH: 32.1 pg (ref 26.0–34.0)
MCHC: 36 g/dL (ref 30.0–36.0)
MCV: 89.1 fL (ref 78.0–100.0)
Monocytes Absolute: 0.4 10*3/uL (ref 0.1–1.0)
Monocytes Relative: 5 % (ref 3–12)
Neutro Abs: 5 10*3/uL (ref 1.7–7.7)
Neutrophils Relative %: 67 % (ref 43–77)
Platelets: 215 10*3/uL (ref 150–400)
RBC: 4.49 MIL/uL (ref 3.87–5.11)
RDW: 12.6 % (ref 11.5–15.5)
WBC: 7.5 10*3/uL (ref 4.0–10.5)

## 2012-04-18 LAB — GRAM STAIN

## 2012-04-18 LAB — BASIC METABOLIC PANEL
BUN: 24 mg/dL — ABNORMAL HIGH (ref 6–23)
CO2: 27 mEq/L (ref 19–32)
Calcium: 10.1 mg/dL (ref 8.4–10.5)
Chloride: 106 mEq/L (ref 96–112)
Creatinine, Ser: 1.31 mg/dL — ABNORMAL HIGH (ref 0.50–1.10)
GFR calc Af Amer: 52 mL/min — ABNORMAL LOW (ref >90)
GFR calc non Af Amer: 45 mL/min — ABNORMAL LOW (ref >90)
Glucose, Bld: 94 mg/dL (ref 70–99)
Potassium: 4.3 mEq/L (ref 3.5–5.1)
Sodium: 141 mEq/L (ref 135–145)

## 2012-04-18 LAB — URINALYSIS, ROUTINE W REFLEX MICROSCOPIC
Bilirubin Urine: NEGATIVE
Glucose, UA: NEGATIVE mg/dL
Hgb urine dipstick: NEGATIVE
Ketones, ur: NEGATIVE mg/dL
Leukocytes, UA: NEGATIVE
Nitrite: NEGATIVE
Protein, ur: NEGATIVE mg/dL
Specific Gravity, Urine: 1.013 (ref 1.005–1.030)
Urobilinogen, UA: 0.2 mg/dL (ref 0.0–1.0)
pH: 6 (ref 5.0–8.0)

## 2012-04-18 LAB — POCT I-STAT, CHEM 8
BUN: 24 mg/dL — ABNORMAL HIGH (ref 6–23)
Calcium, Ion: 1.26 mmol/L — ABNORMAL HIGH (ref 1.12–1.23)
Chloride: 106 mEq/L (ref 96–112)
Creatinine, Ser: 1.3 mg/dL — ABNORMAL HIGH (ref 0.50–1.10)
Glucose, Bld: 93 mg/dL (ref 70–99)
HCT: 42 % (ref 36.0–46.0)
Hemoglobin: 14.3 g/dL (ref 12.0–15.0)
Potassium: 4.3 mEq/L (ref 3.5–5.1)
Sodium: 140 mEq/L (ref 135–145)
TCO2: 27 mmol/L (ref 0–100)

## 2012-04-18 LAB — POCT I-STAT TROPONIN I: Troponin i, poc: 0 ng/mL (ref 0.00–0.08)

## 2012-04-18 LAB — PROTEIN AND GLUCOSE, CSF
Glucose, CSF: 56 mg/dL (ref 43–76)
Total  Protein, CSF: 19 mg/dL (ref 15–45)

## 2012-04-18 MED ORDER — NICARDIPINE HCL IN NACL 20-0.86 MG/200ML-% IV SOLN
5.0000 mg/h | Freq: Once | INTRAVENOUS | Status: DC
  Filled 2012-04-18: qty 200

## 2012-04-18 MED ORDER — METOCLOPRAMIDE HCL 5 MG/ML IJ SOLN
10.0000 mg | Freq: Once | INTRAMUSCULAR | Status: AC
  Administered 2012-04-18: 10 mg via INTRAVENOUS
  Filled 2012-04-18: qty 2

## 2012-04-18 MED ORDER — DEXAMETHASONE SODIUM PHOSPHATE 10 MG/ML IJ SOLN
10.0000 mg | Freq: Once | INTRAMUSCULAR | Status: AC
  Administered 2012-04-18: 10 mg via INTRAVENOUS
  Filled 2012-04-18: qty 1

## 2012-04-18 MED ORDER — HYDROCODONE-ACETAMINOPHEN 5-325 MG PO TABS: 2.0000 | ORAL_TABLET | Freq: Four times a day (QID) | ORAL | Status: DC | PRN

## 2012-04-18 MED ORDER — DIPHENHYDRAMINE HCL 50 MG/ML IJ SOLN
25.0000 mg | Freq: Once | INTRAMUSCULAR | Status: AC
  Administered 2012-04-18: 25 mg via INTRAVENOUS
  Filled 2012-04-18: qty 1

## 2012-04-18 NOTE — ED Notes (Signed)
Pt called out c/o chest pain to left side of chest. Rates 5/10. Describes pain as a heaviness or "gas pain that got stuck in chest". MD Plunkett made aware and no new orders received.

## 2012-04-18 NOTE — ED Notes (Signed)
Patient transported to CT 

## 2012-04-18 NOTE — ED Provider Notes (Signed)
History     CSN: [id number]  Arrival date & time 04/18/12  1339   First MD Initiated Contact with Patient 04/18/12 1347      Chief Complaint  Patient presents with  . Hypertension  . Headache    (Consider location/radiation/quality/duration/timing/severity/associated sxs/prior treatment) Patient is a 57 y.o. female presenting with hypertension and headaches. The history is provided by the patient.  Hypertension Chronicity: unknown.  found to be HTN when EMS arrived.  Off antihypertensives for appx 6months due to weight loss and controlled BP. Episode onset: 3 days ago. The problem occurs constantly. Progression since onset: Waxing and waning. Associated symptoms include headaches. Pertinent negatives include no chest pain, no abdominal pain and no shortness of breath.  Headache Pain location:  Generalized Quality:  Sharp and stabbing Radiates to:  Does not radiate Severity currently:  10/10 Severity at highest:  0/10 (recieved fentanyl by EMS and now HA has resolved) Onset quality:  Gradual Duration:  3 days Timing: waxing and waning. Progression:  Waxing and waning Chronicity:  New Similar to prior headaches: no   Context: bright light and loud noise   Worsened by:  Light and sound Ineffective treatments:  NSAIDs and acetaminophen Associated symptoms: blurred vision, diarrhea, nausea, photophobia and vomiting   Associated symptoms: no abdominal pain, no back pain, no congestion, no fever, no focal weakness, no loss of balance, no neck pain, no neck stiffness, no seizures and no weakness   Risk factors: no family hx of SAH     Past Medical History  Diagnosis Date  . Abdominal pain, epigastric 12/20/2006  . ALLERGIC RHINITIS 12/25/2006  . ANXIETY 10/16/2006  . BACK PAIN 06/15/2008  . BRADYCARDIA 05/04/2008  . Carpal tunnel syndrome 10/16/2006  . Cellulitis and abscess of other specified site 06/15/2008  . CHEST PAIN 09/08/2009  . CIRRHOSIS 10/16/2006  . COMMON MIGRAINE  12/20/2006  . CONTUSION, LOWER LEG 09/15/2008  . DEPRESSION 10/16/2006  . Esophageal reflux 12/25/2006  . HYPERLIPIDEMIA 12/25/2006  . HYPERTENSION 08/15/2006  . HYPOTHYROIDISM 08/15/2006  . MIGRAINE HEADACHE 10/16/2006  . Morbid obesity 10/16/2006  . OVARIAN CYST 12/20/2006  . SHINGLES, HX OF 12/20/2006  . TRANSIENT ISCHEMIC ATTACKS, HX OF 12/20/2006  . VITAMIN B12 DEFICIENCY 01/09/2007  . VERTIGO 09/15/2007  . Psoriasis 01/03/2011    Past Surgical History  Procedure Laterality Date  . Abdominal hysterectomy  02/2006  . Cholecystectomy    . Neck surgery      Family History  Problem Relation Age of Onset  . Diabetes Other   . Hypertension Other     History  Substance Use Topics  . Smoking status: Former Research scientist (life sciences)  . Smokeless tobacco: Not on file  . Alcohol Use: No    OB History   Grav Para Term Preterm Abortions TAB SAB Ect Mult Living                  Review of Systems  Constitutional: Negative for fever.  HENT: Negative for congestion, neck pain and neck stiffness.   Eyes: Positive for blurred vision and photophobia.  Respiratory: Negative for shortness of breath.   Cardiovascular: Negative for chest pain.  Gastrointestinal: Positive for nausea, vomiting and diarrhea. Negative for abdominal pain.  Musculoskeletal: Negative for back pain.  Neurological: Positive for headaches. Negative for focal weakness, seizures and loss of balance.  All other systems reviewed and are negative.    Allergies  Cefuroxime axetil and Ciprofloxacin  Home Medications   Current Outpatient Rx  Name  Route  Sig  Dispense  Refill  . aspirin 325 MG EC tablet   Oral   Take 325 mg by mouth daily.           . cloNIDine (CATAPRES) 0.1 MG tablet      TAKE ONE TABLET BY MOUTH TWICE DAILY   60 tablet   11   . Lancets MISC   Does not apply   1 application by Does not apply route daily.   100 each   12   . levothyroxine (SYNTHROID, LEVOTHROID) 112 MCG tablet   Oral   Take 1  tablet (112 mcg total) by mouth daily.   90 tablet   3   . lisinopril (PRINIVIL,ZESTRIL) 10 MG tablet   Oral   Take 10 mg by mouth daily.         Marland Kitchen nystatin (MYCOSTATIN) 100000 UNIT/ML suspension   Oral   Take 5 mLs (500,000 Units total) by mouth 4 (four) times daily.   60 mL   0     BP 177/83  Temp(Src) 97.5 F (36.4 C)  Resp 14  SpO2 99%  Physical Exam  Nursing note and vitals reviewed. Constitutional: She is oriented to person, place, and time. She appears well-developed and well-nourished. No distress.  HENT:  Head: Normocephalic and atraumatic.  Mouth/Throat: Oropharynx is clear and moist.  Eyes: Conjunctivae and EOM are normal. Pupils are equal, round, and reactive to light.  photophobic  Neck: Normal range of motion. Neck supple.  Cardiovascular: Normal rate, regular rhythm and intact distal pulses.   No murmur heard. Pulmonary/Chest: Effort normal and breath sounds normal. No respiratory distress. She has no wheezes. She has no rales.  Abdominal: Soft. She exhibits no distension. There is no tenderness. There is no rebound and no guarding.  Musculoskeletal: Normal range of motion. She exhibits no edema and no tenderness.  Neurological: She is alert and oriented to person, place, and time. She has normal strength. No cranial nerve deficit or sensory deficit. Coordination and gait normal.  Skin: Skin is warm and dry. No rash noted. No erythema.  Psychiatric: She has a normal mood and affect. Her behavior is normal.    ED Course  LUMBAR PUNCTURE Date/Time: 04/18/2012 4:32 PM Performed by: Blanchie Dessert Authorized by: Blanchie Dessert Consent: Verbal consent obtained. written consent obtained. Risks and benefits: risks, benefits and alternatives were discussed Consent given by: patient and spouse Patient understanding: patient states understanding of the procedure being performed Patient consent: the patient's understanding of the procedure matches consent  given Procedure consent: procedure consent matches procedure scheduled Relevant documents: relevant documents present and verified Test results: test results available and properly labeled Site marked: the operative site was marked Imaging studies: imaging studies available Patient identity confirmed: verbally with patient Time out: Immediately prior to procedure a "time out" was called to verify the correct patient, procedure, equipment, support staff and site/side marked as required. Indications: evaluation for subarachnoid hemorrhage Anesthesia: local infiltration Local anesthetic: lidocaine 1% without epinephrine Anesthetic total: 5 ml Patient sedated: no Preparation: Patient was prepped and draped in the usual sterile fashion. Lumbar space: L4-L5 interspace Patient's position: left lateral decubitus Needle gauge: 22 Needle type: diamond point Number of attempts: 1 Opening pressure: 21 cm H2O Fluid appearance: clear Tubes of fluid: 4 Total volume: 4 ml Post-procedure: site cleaned and adhesive bandage applied Patient tolerance: Patient tolerated the procedure well with no immediate complications.   (including critical care time)  Labs Reviewed  BASIC METABOLIC  PANEL - Abnormal; Notable for the following:    BUN 24 (*)    Creatinine, Ser 1.31 (*)    GFR calc non Af Amer 45 (*)    GFR calc Af Amer 52 (*)    All other components within normal limits  POCT I-STAT, CHEM 8 - Abnormal; Notable for the following:    BUN 24 (*)    Creatinine, Ser 1.30 (*)    Calcium, Ion 1.26 (*)    All other components within normal limits  CSF CULTURE  CBC WITH DIFFERENTIAL  URINALYSIS, ROUTINE W REFLEX MICROSCOPIC  CSF CELL COUNT WITH DIFFERENTIAL  PROTEIN AND GLUCOSE, CSF  POCT I-STAT TROPONIN I   Dg Chest 2 View  04/18/2012  *RADIOLOGY REPORT*  Clinical Data: Hypertension, headache  CHEST - 2 VIEW  Comparison: 06/18/2011  Findings: Cardiomegaly is noted.  No acute infiltrate or pleural  effusion.  No pulmonary edema.  Mild elevation of the right hemidiaphragm.  Bony thorax is stable.  IMPRESSION: Cardiomegaly.  No active disease.   Original Report Authenticated By: Lahoma Crocker, M.D.      Date: 04/18/2012  Rate: 46  Rhythm: sinus bradycardia  QRS Axis: normal  Intervals: normal  ST/T Wave abnormalities: normal  Conduction Disutrbances:none  Narrative Interpretation:   Old EKG Reviewed: unchanged    No diagnosis found.    MDM   Complaining of a headache that started 3 days ago and has been waxing and waning. When EMS arrived patient had a blood pressure of [id number] systolic. She was given 150 of fentanyl on the way here. On arrival here patient's headache has resolved the blood pressure is improved 177/80. She states she stopped taking blood pressure medications approximately 3 months ago per her PCP due to 40 pound weight loss and has been checking it regularly and it runs 130s over 80s.  She also complained of bilateral blurry vision but denied any focal weakness or numbness. Prior history of migraine headaches 10-20 years ago but no headaches since. Patient denies any recent medication changes but states that she has been vomiting with a little bit of diarrhea. She denies any neck pain or fever. The headache was not thunderclap but she states it is the worst headache she has ever had. There is the possibility of a subarachnoid hemorrhage however my suspicion is low. This also could be hypertensive urgency from unknown cause versus intercranial hemorrhage. Lower suspicion for infectious etiology.  2:33 PM BP continues to improve.  154/60.  Will continue to follow.  3:34 PM CT head neg.  However feel will need to get LP to r/o SAH or pseudotumor.  HA cocktail given for persistent HA despite normal BP.  4:31 PM Headache resolved after headache cocktail. LP with a pressure of 21 and results are pending. Patient checked out to Dr. Stark Jock at 1600.  Blanchie Dessert,  MD 04/18/12 [phone]

## 2012-04-18 NOTE — ED Notes (Addendum)
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 Noted to be HTN 250/130 manual. Hx of HTN, taken off of BP medications due to losing weight and normal BP's. 12 Lead EKG showing frequent PVC's. Pt given fentanyl 111mcg and Zofran 4 mg IVP. Pt reports blurry vision. No neuro deficits per EMS. IV 22G L Hand. Pt photosensitive and has blurred vision.

## 2012-04-18 NOTE — ED Provider Notes (Signed)
Care assumed from Dr. Maryan Rued ar shift change awaiting LP results.  CSF clear, no sign of SAH or meningitis.  She is pain-free and will be discharged to home.   Veryl Speak, MD 04/18/12 Vernelle Emerald

## 2012-04-18 NOTE — ED Notes (Signed)
Pt resting quietly at this time supine on stretcher.  St's headache has subsided.  Husband at bedside.  No complaints voiced.

## 2012-04-18 NOTE — ED Notes (Signed)
Consent signed by pt for Lumbar Puncture after procedure explained to pt by Dr. Viona Gilmore. Heather Dawson

## 2012-04-21 ENCOUNTER — Ambulatory Visit (INDEPENDENT_AMBULATORY_CARE_PROVIDER_SITE_OTHER): Payer: Self-pay | Admitting: Internal Medicine

## 2012-04-21 ENCOUNTER — Encounter: Payer: Self-pay | Admitting: Internal Medicine

## 2012-04-21 VITALS — BP 148/74 | HR 67 | Temp 97.8°F | Resp 16 | Wt 183.0 lb

## 2012-04-21 DIAGNOSIS — G43901 Migraine, unspecified, not intractable, with status migrainosus: Secondary | ICD-10-CM

## 2012-04-21 DIAGNOSIS — G43009 Migraine without aura, not intractable, without status migrainosus: Secondary | ICD-10-CM

## 2012-04-21 LAB — CSF CULTURE
Culture: NO GROWTH
Gram Stain: NONE SEEN

## 2012-04-21 LAB — CSF CULTURE W GRAM STAIN

## 2012-04-21 MED ORDER — MEPERIDINE HCL 25 MG/ML IJ SOLN
50.0000 mg | Freq: Once | INTRAMUSCULAR | Status: AC
  Administered 2012-04-21: 50 mg via INTRAMUSCULAR

## 2012-04-21 MED ORDER — NAPROXEN 500 MG PO TABS: 500.0000 mg | ORAL_TABLET | Freq: Two times a day (BID) | ORAL | Status: DC

## 2012-04-21 MED ORDER — PROMETHAZINE HCL 25 MG PO TABS: 25.0000 mg | ORAL_TABLET | Freq: Four times a day (QID) | ORAL | Status: DC | PRN

## 2012-04-21 MED ORDER — SUMATRIPTAN SUCCINATE 100 MG PO TABS: 100.0000 mg | ORAL_TABLET | ORAL | Status: DC | PRN

## 2012-04-21 MED ORDER — ELETRIPTAN HYDROBROMIDE 40 MG PO TABS: 40.0000 mg | ORAL_TABLET | ORAL | Status: DC | PRN

## 2012-04-21 MED ORDER — PROMETHAZINE HCL 25 MG/ML IJ SOLN
25.0000 mg | Freq: Once | INTRAMUSCULAR | Status: AC
  Administered 2012-04-21: 25 mg via INTRAMUSCULAR

## 2012-04-21 NOTE — Progress Notes (Signed)
Subjective:    Patient ID: Heather Dawson, female    DOB: 05-18-1955, 57 y.o.   MRN: [id number]  HPI  Here to f/u as per ER visit:  Headache  Pain location: Generalized  Quality: Sharp and stabbing  Radiates to: Does not radiate  Severity currently: 10/10  Severity at highest: 0/10 (recieved fentanyl by EMS and now HA has resolved)  Onset quality: Gradual  Duration: 3 days  Timing: waxing and waning.  Progression: Waxing and waning  Chronicity: New Similar to prior headaches: no  Context: bright light and loud noise  Worsened by: Light and sound  Ineffective treatments: NSAIDs and acetaminophen Associated symptoms: blurred vision, diarrhea, nausea, photophobia and vomiting  Associated symptoms: no abdominal pain, no back pain, no congestion, no fever, no focal weakness, no loss of balance, no neck pain, no neck stiffness, no seizures and no weakness  Risk factors: no family hx of SAH   Had marked refief for about 8 hrs post HA cocktail in ER, now recurred , now 8/10 with nausea, photophobia, essentially constant x 5 days.  CT head/LP neg for acute.  Relpax has helped, but leftover pills yesterday at 20 mg did not Past Medical History  Diagnosis Date  . Abdominal pain, epigastric 12/20/2006  . ALLERGIC RHINITIS 12/25/2006  . ANXIETY 10/16/2006  . BACK PAIN 06/15/2008  . BRADYCARDIA 05/04/2008  . Carpal tunnel syndrome 10/16/2006  . Cellulitis and abscess of other specified site 06/15/2008  . CHEST PAIN 09/08/2009  . CIRRHOSIS 10/16/2006  . COMMON MIGRAINE 12/20/2006  . CONTUSION, LOWER LEG 09/15/2008  . DEPRESSION 10/16/2006  . Esophageal reflux 12/25/2006  . HYPERLIPIDEMIA 12/25/2006  . HYPERTENSION 08/15/2006  . HYPOTHYROIDISM 08/15/2006  . MIGRAINE HEADACHE 10/16/2006  . Morbid obesity 10/16/2006  . OVARIAN CYST 12/20/2006  . SHINGLES, HX OF 12/20/2006  . TRANSIENT ISCHEMIC ATTACKS, HX OF 12/20/2006  . VITAMIN B12 DEFICIENCY 01/09/2007  . VERTIGO 09/15/2007  . Psoriasis 01/03/2011    Past Surgical History  Procedure Laterality Date  . Abdominal hysterectomy  02/2006  . Cholecystectomy    . Neck surgery      reports that she has quit smoking. She does not have any smokeless tobacco history on file. She reports that she does not drink alcohol or use illicit drugs. family history includes Diabetes in her other and Hypertension in her other. Allergies  Allergen Reactions  . Cefuroxime Axetil     REACTION: edema/swelling  . Ciprofloxacin Swelling  . Shellfish Allergy Swelling   Current Outpatient Prescriptions on File Prior to Visit  Medication Sig Dispense Refill  . aspirin 325 MG EC tablet Take 325 mg by mouth daily.        Marland Kitchen HYDROcodone-acetaminophen (NORCO/VICODIN) 5-325 MG per tablet Take 2 tablets by mouth every 6 (six) hours as needed for pain.  15 tablet  0  . levothyroxine (SYNTHROID, LEVOTHROID) 125 MCG tablet Take 125 mcg by mouth daily.       No current facility-administered medications on file prior to visit.     Review of Systems  Constitutional: Negative for unexpected weight change, or unusual diaphoresis  HENT: Negative for tinnitus.   Respiratory: Negative for choking and stridor.   Gastrointestinal: Negative for vomiting and blood in stool.  Genitourinary: Negative for hematuria and decreased urine volume.  Musculoskeletal: Negative for acute joint swelling Skin: Negative for color change and wound.  Neurological: Negative for tremors and numbness other than noted  Psychiatric/Behavioral: Denies worsening depressive symptoms, suicidal ideation, or panic;  has ongoing anxiety    Objective:   Physical Exam BP 148/74  Pulse 67  Temp(Src) 97.8 F (36.6 C) (Oral)  Resp 16  Wt 183 lb (83.008 kg)  BMI 33.46 kg/m2  SpO2 97% VS noted,  Constitutional: Pt appears well-developed and well-nourished.  HENT: Head: NCAT.  Right Ear: External ear normal.  Left Ear: External ear normal.  Eyes: Conjunctivae and EOM are normal. Pupils are equal,  round, and reactive to light.  Neck: Normal range of motion. Neck supple.  Cardiovascular: Normal rate and regular rhythm.   Pulmonary/Chest: Effort normal and breath sounds normal.  Abd:  Soft, NT, non-distended, + BS Neurological: Pt is alert. Not confused , motor/dtr intact Skin: Skin is warm. No erythema.  Psychiatric: Pt behavior is normal. Thought content normal.     Assessment & Plan:

## 2012-04-21 NOTE — Patient Instructions (Addendum)
You had the demerol 50 mg/phenergan 25 mg shot for migraine today Please take all new medication as prescribed - the antiinflammatory and nausea medication to use with the Relpax you have at home for HA that tries to come back If the relpax is too expensive, please call for change to imitrex which may be less expensive since it is generic  Please continue all other medications as before, and refills have been done if requested. Please remember to sign up for My Chart if you have not done so, as this will be important to you in the future with finding out test results, communicating by private email, and scheduling acute appointments online when needed.

## 2012-04-21 NOTE — Assessment & Plan Note (Signed)
Severe recurrent/persistent over 5 days, recent ER eval neg, neuro exam unchanged, will hold on MRI, but for demerol/phenergan 50/25 mg IM now, with rx for naproxyn, phenergan and imitrex for any recurrence

## 2012-04-22 ENCOUNTER — Telehealth: Payer: Self-pay | Admitting: Internal Medicine

## 2012-04-22 NOTE — Telephone Encounter (Signed)
Sorry, the imitrex is hopefully the less expensive between imitrex and relpax since it is generic

## 2012-04-22 NOTE — Telephone Encounter (Signed)
Patient Information:  Caller Name: [name]  Phone: [phone]  Patient: Heather Dawson  Gender: Female  DOB: 07-21-1955  Age: 57 Years  PCP: [name] (Adults only)  Pregnant: No  Office Follow Up:  Does the office need to follow up with this patient?: Yes  Instructions For The Office: Requests alternate Rx to [facility] on [facility]; states cannot afford the Imitrex.  Thank you.   Symptoms  Reason For Call & Symptoms: Headaches reported.  Seen in ED for same and followed up with Dr. Jenny Reichmann  on 04/21/12. Phenergan and Demerol did not relieve pain.  Headache rated at 8-9 of 10 when sitting up.  "Every time she sits up itstarts behind her left eye."  Rx Imitrex  was sent in; states unable to afford this as no insurance.  Caller asks for alternate medication and states that Dr. Jenny Reichmann advised to call if Rx was too expensive.  Reviewed Health History In EMR: Yes  Reviewed Medications In EMR: Yes  Reviewed Allergies In EMR: Yes  Reviewed Surgeries / Procedures: Yes  Date of Onset of Symptoms: 04/18/2012  Treatments Tried: Demerol & Phenergan, Hydrocodone, Relpax  Treatments Tried Worked: No OB / GYN:  LMP: Unknown  Guideline(s) Used:  Headache  Disposition Per Guideline:   Callback by PCP or Subspecialist within 1 Hour  Reason For Disposition Reached:   Severe headache and not relieved by pain meds  Advice Given:  Reassurance - Migraine Headache:  This sounds like a painful headache that you are having, but there are pain medications you can take and other instructions I can give you to reduce the pain.  Migraine Medication:   If your doctor has prescribed specific medication for your migraine, take it as directed as soon as the migraine starts.  Rest:   Lie down in a dark, quiet place and try to relax. Close your eyes and imagine your entire body relaxing.  Apply Cold to the Area:   Apply a cold wet washcloth or cold pack to the forehead for 20 minutes.  Stretching:   Stretch  and massage any tight neck muscles.  Call Back If:  You become worse.

## 2012-04-23 NOTE — Telephone Encounter (Signed)
Patient informed. 

## 2012-04-30 ENCOUNTER — Telehealth: Payer: Self-pay

## 2012-04-30 NOTE — Telephone Encounter (Signed)
Pharmacy requesting if ok to change to [facility]oz brand on levothroid medication.  They no longer carry Mylan brand.  Please advise

## 2012-04-30 NOTE — Telephone Encounter (Signed)
Ok for brand change

## 2012-05-01 NOTE — Telephone Encounter (Signed)
Pharmacy informed ok per MD.

## 2012-07-01 ENCOUNTER — Ambulatory Visit: Payer: Self-pay | Admitting: Internal Medicine

## 2012-12-20 ENCOUNTER — Emergency Department (HOSPITAL_COMMUNITY): Payer: BC Managed Care – PPO

## 2012-12-20 ENCOUNTER — Encounter (HOSPITAL_COMMUNITY): Payer: Self-pay | Admitting: Emergency Medicine

## 2012-12-20 ENCOUNTER — Emergency Department (HOSPITAL_COMMUNITY)
Admission: EM | Admit: 2012-12-20 | Discharge: 2012-12-20 | Disposition: A | Discharge status: Home / Self Care | Payer: BC Managed Care – PPO | Attending: Emergency Medicine | Admitting: Emergency Medicine

## 2012-12-20 DIAGNOSIS — Z8619 Personal history of other infectious and parasitic diseases: Secondary | ICD-10-CM | POA: Insufficient documentation

## 2012-12-20 DIAGNOSIS — Z79899 Other long term (current) drug therapy: Secondary | ICD-10-CM | POA: Insufficient documentation

## 2012-12-20 DIAGNOSIS — Z87891 Personal history of nicotine dependence: Secondary | ICD-10-CM | POA: Insufficient documentation

## 2012-12-20 DIAGNOSIS — Z7982 Long term (current) use of aspirin: Secondary | ICD-10-CM | POA: Insufficient documentation

## 2012-12-20 DIAGNOSIS — Z872 Personal history of diseases of the skin and subcutaneous tissue: Secondary | ICD-10-CM | POA: Insufficient documentation

## 2012-12-20 DIAGNOSIS — Z8673 Personal history of transient ischemic attack (TIA), and cerebral infarction without residual deficits: Secondary | ICD-10-CM | POA: Insufficient documentation

## 2012-12-20 DIAGNOSIS — G43909 Migraine, unspecified, not intractable, without status migrainosus: Secondary | ICD-10-CM | POA: Insufficient documentation

## 2012-12-20 DIAGNOSIS — R519 Headache, unspecified: Secondary | ICD-10-CM

## 2012-12-20 DIAGNOSIS — E039 Hypothyroidism, unspecified: Secondary | ICD-10-CM | POA: Insufficient documentation

## 2012-12-20 DIAGNOSIS — Z8659 Personal history of other mental and behavioral disorders: Secondary | ICD-10-CM | POA: Insufficient documentation

## 2012-12-20 DIAGNOSIS — Z87828 Personal history of other (healed) physical injury and trauma: Secondary | ICD-10-CM | POA: Insufficient documentation

## 2012-12-20 DIAGNOSIS — Z8719 Personal history of other diseases of the digestive system: Secondary | ICD-10-CM | POA: Insufficient documentation

## 2012-12-20 DIAGNOSIS — Z8742 Personal history of other diseases of the female genital tract: Secondary | ICD-10-CM | POA: Insufficient documentation

## 2012-12-20 DIAGNOSIS — I10 Essential (primary) hypertension: Secondary | ICD-10-CM | POA: Insufficient documentation

## 2012-12-20 LAB — CBC
HCT: 41.3 % (ref 36.0–46.0)
Hemoglobin: 14.8 g/dL (ref 12.0–15.0)
MCH: 33 pg (ref 26.0–34.0)
MCHC: 35.8 g/dL (ref 30.0–36.0)
MCV: 92.2 fL (ref 78.0–100.0)
Platelets: 209 10*3/uL (ref 150–400)
RBC: 4.48 MIL/uL (ref 3.87–5.11)
RDW: 12.4 % (ref 11.5–15.5)
WBC: 8.9 10*3/uL (ref 4.0–10.5)

## 2012-12-20 LAB — BASIC METABOLIC PANEL
BUN: 20 mg/dL (ref 6–23)
CO2: 22 mEq/L (ref 19–32)
Calcium: 9.1 mg/dL (ref 8.4–10.5)
Chloride: 104 mEq/L (ref 96–112)
Creatinine, Ser: 1.13 mg/dL — ABNORMAL HIGH (ref 0.50–1.10)
GFR calc Af Amer: 62 mL/min — ABNORMAL LOW (ref >90)
GFR calc non Af Amer: 53 mL/min — ABNORMAL LOW (ref >90)
Glucose, Bld: 129 mg/dL — ABNORMAL HIGH (ref 70–99)
Potassium: 4.1 mEq/L (ref 3.5–5.1)
Sodium: 137 mEq/L (ref 135–145)

## 2012-12-20 LAB — PROTIME-INR
INR: 0.96 (ref 0.00–1.49)
Prothrombin Time: 12.6 seconds (ref 11.6–15.2)

## 2012-12-20 MED ORDER — MORPHINE SULFATE 4 MG/ML IJ SOLN
4.0000 mg | Freq: Once | INTRAMUSCULAR | Status: AC
  Administered 2012-12-20: 4 mg via INTRAVENOUS
  Filled 2012-12-20: qty 1

## 2012-12-20 MED ORDER — ONDANSETRON HCL 4 MG/2ML IJ SOLN
4.0000 mg | Freq: Once | INTRAMUSCULAR | Status: AC
  Administered 2012-12-20: 4 mg via INTRAVENOUS
  Filled 2012-12-20: qty 2

## 2012-12-20 NOTE — ED Notes (Signed)
Patient transported to CT 

## 2012-12-20 NOTE — ED Notes (Signed)
Pt. Stated. Headache since thsi morning.  No BP meds.  Doctor took me off.

## 2012-12-20 NOTE — ED Notes (Signed)
Pt. To CT before medication given

## 2012-12-20 NOTE — ED Provider Notes (Signed)
Medical screening examination/treatment/procedure(s) were conducted as a shared visit with non-physician practitioner(s) and myself.  I personally evaluated the patient during the encounter.  EKG Interpretation   None       Patient with concerning HA history with worst HA of life, one sided and waking from sleep. CT neg, obtained 7 hours after onset. Discussed that due to high concern an LP is usually performed next. After discussing risks of not doing LP (missing sentinel bleed, worse aneurysm later, death, etc) patient declines LP. She had a different type headache 1 year ago and got an LP that was negative. She understands return precautions, due to strong suspicion will have patient leave AMA.  Ephraim Hamburger, MD 12/20/12 [phone]

## 2012-12-20 NOTE — ED Notes (Signed)
Pt. Took 2 regular strength Tylenol at 0930 no help.

## 2012-12-20 NOTE — ED Notes (Signed)
Report to Haley, RN 

## 2012-12-20 NOTE — ED Notes (Signed)
Tender to touch

## 2012-12-20 NOTE — ED Provider Notes (Signed)
CSN: [id number]     Arrival date & time 12/20/12  1017 History   First MD Initiated Contact with Patient 12/20/12 1022     Chief Complaint  Patient presents with  . Headache   (Consider location/radiation/quality/duration/timing/severity/associated sxs/prior Treatment) HPI Comments: Patient presents to the emergency department with chief complaint of headache. Patient states the headache began this morning at 4 AM. She states that it awoke her from sleep. She states this is the worst headache of her life. She endorses associated nausea. She states that she has remote history of migraines, but has not had a migraine in years.  She states that the pain comes in waves.  She endorses associated photophobia and phonophobia.  No ataxia.  She has tried taking tylenol with no relief.  The history is provided by the patient. No language interpreter was used.    Past Medical History  Diagnosis Date  . Abdominal pain, epigastric 12/20/2006  . ALLERGIC RHINITIS 12/25/2006  . ANXIETY 10/16/2006  . BACK PAIN 06/15/2008  . BRADYCARDIA 05/04/2008  . Carpal tunnel syndrome 10/16/2006  . Cellulitis and abscess of other specified site 06/15/2008  . CHEST PAIN 09/08/2009  . CIRRHOSIS 10/16/2006  . COMMON MIGRAINE 12/20/2006  . CONTUSION, LOWER LEG 09/15/2008  . DEPRESSION 10/16/2006  . Esophageal reflux 12/25/2006  . HYPERLIPIDEMIA 12/25/2006  . HYPERTENSION 08/15/2006  . HYPOTHYROIDISM 08/15/2006  . MIGRAINE HEADACHE 10/16/2006  . Morbid obesity 10/16/2006  . OVARIAN CYST 12/20/2006  . SHINGLES, HX OF 12/20/2006  . TRANSIENT ISCHEMIC ATTACKS, HX OF 12/20/2006  . VITAMIN B12 DEFICIENCY 01/09/2007  . VERTIGO 09/15/2007  . Psoriasis 01/03/2011   Past Surgical History  Procedure Laterality Date  . Abdominal hysterectomy  02/2006  . Cholecystectomy    . Neck surgery     Family History  Problem Relation Age of Onset  . Diabetes Other   . Hypertension Other    History  Substance Use Topics  . Smoking  status: Former Research scientist (life sciences)  . Smokeless tobacco: Not on file  . Alcohol Use: No   OB History   Grav Para Term Preterm Abortions TAB SAB Ect Mult Living                 Review of Systems  All other systems reviewed and are negative.    Allergies  Cefuroxime axetil; Ciprofloxacin; and Shellfish allergy  Home Medications   Current Outpatient Rx  Name  Route  Sig  Dispense  Refill  . aspirin 325 MG EC tablet   Oral   Take 325 mg by mouth daily.           Marland Kitchen HYDROcodone-acetaminophen (NORCO/VICODIN) 5-325 MG per tablet   Oral   Take 2 tablets by mouth every 6 (six) hours as needed for pain.   15 tablet   0   . levothyroxine (SYNTHROID, LEVOTHROID) 125 MCG tablet   Oral   Take 125 mcg by mouth daily.         . naproxen (NAPROSYN) 500 MG tablet   Oral   Take 1 tablet (500 mg total) by mouth 2 (two) times daily with a meal.   60 tablet   2   . promethazine (PHENERGAN) 25 MG tablet   Oral   Take 1 tablet (25 mg total) by mouth every 6 (six) hours as needed for nausea.   30 tablet   1   . SUMAtriptan (IMITREX) 100 MG tablet   Oral   Take 1 tablet (100 mg total) by  mouth every 2 (two) hours as needed for migraine. With limit 10 per month   10 tablet   2    BP 210/109  Pulse 88  Temp(Src) 98.7 F (37.1 C) (Oral)  SpO2 99% Physical Exam  Nursing note and vitals reviewed. Constitutional: She is oriented to person, place, and time. She appears well-developed and well-nourished.  Tearful during exam  HENT:  Head: Normocephalic and atraumatic.  Right Ear: External ear normal.  Left Ear: External ear normal.  Eyes: Conjunctivae and EOM are normal. Pupils are equal, round, and reactive to light.  Neck: Normal range of motion. Neck supple.  No pain with neck flexion, no meningismus  Cardiovascular: Normal rate, regular rhythm and normal heart sounds.  Exam reveals no gallop and no friction rub.   No murmur heard. Pulmonary/Chest: Effort normal and breath sounds  normal. No respiratory distress. She has no wheezes. She has no rales. She exhibits no tenderness.  Abdominal: Soft. Bowel sounds are normal. She exhibits no distension and no mass. There is no tenderness. There is no rebound and no guarding.  Musculoskeletal: Normal range of motion. She exhibits no edema and no tenderness.  Normal gait.  Neurological: She is alert and oriented to person, place, and time. She has normal reflexes.  CN 3-12 intact, no pronator drift, normal shin to heel, normal RAM, sensation and strength intact bilaterally.  Skin: Skin is warm and dry.  Psychiatric: She has a normal mood and affect. Her behavior is normal. Judgment and thought content normal.    ED Course  Procedures (including critical care time) Results for orders placed during the hospital encounter of 12/20/12  CBC      Result Value Range   WBC 8.9  4.0 - 10.5 K/uL   RBC 4.48  3.87 - 5.11 MIL/uL   Hemoglobin 14.8  12.0 - 15.0 g/dL   HCT 41.3  36.0 - 46.0 %   MCV 92.2  78.0 - 100.0 fL   MCH 33.0  26.0 - 34.0 pg   MCHC 35.8  30.0 - 36.0 g/dL   RDW 12.4  11.5 - 15.5 %   Platelets 209  150 - 400 K/uL  BASIC METABOLIC PANEL      Result Value Range   Sodium 137  135 - 145 mEq/L   Potassium 4.1  3.5 - 5.1 mEq/L   Chloride 104  96 - 112 mEq/L   CO2 22  19 - 32 mEq/L   Glucose, Bld 129 (*) 70 - 99 mg/dL   BUN 20  6 - 23 mg/dL   Creatinine, Ser 1.13 (*) 0.50 - 1.10 mg/dL   Calcium 9.1  8.4 - 10.5 mg/dL   GFR calc non Af Amer 53 (*) >90 mL/min   GFR calc Af Amer 62 (*) >90 mL/min  PROTIME-INR      Result Value Range   Prothrombin Time 12.6  11.6 - 15.2 seconds   INR 0.96  0.00 - 1.49   Ct Head Wo Contrast  12/20/2012   CLINICAL DATA:  Sudden onset right-sided headache.  EXAM: CT HEAD WITHOUT CONTRAST  TECHNIQUE: Contiguous axial images were obtained from the base of the skull through the vertex without intravenous contrast.  COMPARISON:  04/18/2012.  FINDINGS: No mass lesion, mass effect, midline  shift, hydrocephalus, hemorrhage. No territorial ischemia or acute infarction. Scout images appear within normal limits. Mastoid air cells clear. Paranasal sinuses are within normal limits.  IMPRESSION: Negative CT head.   Electronically Signed  By: Dereck Ligas M.D.   On: 12/20/2012 11:24      EKG Interpretation   None       MDM   1. Headache     Patient with headache. Sudden in onset, worst headache of patient's lack. Will order CT head. Will treat headache with pain medicine, will give fluids and Zofran. Patient has history of renal failure, so we'll not use a migraine cocktail. Patient discussed with Dr. Regenia Skeeter, who agrees with the plan. Will treat headache with pain medicine first, and if no improvement, will add labetalol for blood pressure.  11:41 AM Patient seen by and discussed with Dr. Regenia Skeeter.  Patient's symptoms have improved with morphine.  BP is decreased.  However, concern regarding the patient's story still exists.  Discussed with Dr. Regenia Skeeter.  LP will be necessary to rule out SAH.   Patients that they would like to leave.  I have discussed my concerns with the patient about them leaving without completing the evaluation.    Patient presents with headache  Symptoms include: severe headache  Concern for: [facility]  Study limitations and other tests offered include: CT was negative, but LP was deferred 2/2 patient refusal.  SAH cannot be completely ruled out.  Treatment and recommended follow-up include: Ice, NSAIDs, rest, and PCP follow-up.  Return to the ED if the symptoms change or worsen.  I do not feel that the patient should leave prior to completing their workup. Dr. Regenia Skeeter and myself have discussed the above symptoms, initial findings, study limitations, and treatment plan with the patient. Patient is not altered, does not have any distracting issues, and has capacity to make decisions for themselves. Patient places themselves at risk of SAH/brain bleed,  worsening symptoms, disability, and/or death.  Patient is leaving against medical advice.  Filed Vitals:   12/20/12 1100  BP: 186/90  Pulse: 72  Temp:       Montine Circle, PA-C 12/20/12 1257

## 2012-12-30 ENCOUNTER — Ambulatory Visit (INDEPENDENT_AMBULATORY_CARE_PROVIDER_SITE_OTHER): Payer: Self-pay | Admitting: Internal Medicine

## 2012-12-30 ENCOUNTER — Encounter: Payer: Self-pay | Admitting: Internal Medicine

## 2012-12-30 VITALS — BP 122/76 | HR 82 | Temp 98.4°F | Ht 62.0 in | Wt 214.0 lb

## 2012-12-30 DIAGNOSIS — F411 Generalized anxiety disorder: Secondary | ICD-10-CM

## 2012-12-30 DIAGNOSIS — G43009 Migraine without aura, not intractable, without status migrainosus: Secondary | ICD-10-CM

## 2012-12-30 DIAGNOSIS — Z23 Encounter for immunization: Secondary | ICD-10-CM

## 2012-12-30 DIAGNOSIS — IMO0002 Reserved for concepts with insufficient information to code with codable children: Secondary | ICD-10-CM

## 2012-12-30 DIAGNOSIS — I10 Essential (primary) hypertension: Secondary | ICD-10-CM

## 2012-12-30 DIAGNOSIS — L03012 Cellulitis of left finger: Secondary | ICD-10-CM

## 2012-12-30 MED ORDER — FLUOXETINE HCL 20 MG PO CAPS: 20.0000 mg | ORAL_CAPSULE | Freq: Every day | ORAL | Status: DC

## 2012-12-30 MED ORDER — AMLODIPINE BESYLATE 10 MG PO TABS: 10.0000 mg | ORAL_TABLET | Freq: Every day | ORAL | Status: DC

## 2012-12-30 MED ORDER — LEVOTHYROXINE SODIUM 125 MCG PO TABS: 125.0000 ug | ORAL_TABLET | Freq: Every day | ORAL | Status: DC

## 2012-12-30 MED ORDER — SUMATRIPTAN SUCCINATE 100 MG PO TABS: 100.0000 mg | ORAL_TABLET | ORAL | Status: DC | PRN

## 2012-12-30 MED ORDER — DOXYCYCLINE HYCLATE 100 MG PO TABS: 100.0000 mg | ORAL_TABLET | Freq: Two times a day (BID) | ORAL | Status: DC

## 2012-12-30 MED ORDER — LISINOPRIL 10 MG PO TABS: 10.0000 mg | ORAL_TABLET | Freq: Every day | ORAL | Status: DC

## 2012-12-30 MED ORDER — OMEPRAZOLE 20 MG PO CPDR: 20.0000 mg | DELAYED_RELEASE_CAPSULE | Freq: Every day | ORAL | Status: DC

## 2012-12-30 NOTE — Assessment & Plan Note (Signed)
.  stable overall by history and exam, recent data reviewed with pt, and pt to continue medical treatment as before,  to f/u any worsening symptoms or concerns BP Readings from Last 3 Encounters:  12/30/12 122/76  12/20/12 121/60  04/21/12 148/74  '

## 2012-12-30 NOTE — Assessment & Plan Note (Signed)
For generic imitrex prn

## 2012-12-30 NOTE — Progress Notes (Signed)
Pre-visit discussion using our clinic review tool. No additional management support is needed unless otherwise documented below in the visit note.  

## 2012-12-30 NOTE — Progress Notes (Signed)
Subjective:    Patient ID: Heather Dawson, female    DOB: 03-15-1955, 57 y.o.   MRN: [id number]  HPI  Here to f/u, unfort with increased freq and severity of right sided migraine, one severe about nov 1 had to go to ER, CT head neg.  She declined LP, as she had just had one for similar events 8 mo prior.  Tx with MSIR IV and resolved, also with another similar HA 3 days ago, took sister relpax x 2, this time HA was left sided with nausea, slept it off after the replax. Came back 2 days after briefly.  Took an extra BP pill this am though BP was 129 sbp. Notes migraine seem worse must after meals, not sure why.   Pt denies fever, wt loss, night sweats, loss of appetite, or other constitutional symptoms  No insurance today, Relpax too expensive. Denies worsening depressive symptoms, suicidal ideation, or panic; has ongoing anxiety, increased recently with start new job, unfort without benefits, as [employer] where she now works could no longer afford ins, and obamacare deductible too high. Due for flu shot.  Pt denies polydipsia, polyuria, declines labs due to cost. Also with incidental paronychia like ? Cyst to nail bed index finger left hand Past Medical History  Diagnosis Date  . Abdominal pain, epigastric 12/20/2006  . ALLERGIC RHINITIS 12/25/2006  . ANXIETY 10/16/2006  . BACK PAIN 06/15/2008  . BRADYCARDIA 05/04/2008  . Carpal tunnel syndrome 10/16/2006  . Cellulitis and abscess of other specified site 06/15/2008  . CHEST PAIN 09/08/2009  . CIRRHOSIS 10/16/2006  . COMMON MIGRAINE 12/20/2006  . CONTUSION, LOWER LEG 09/15/2008  . DEPRESSION 10/16/2006  . Esophageal reflux 12/25/2006  . HYPERLIPIDEMIA 12/25/2006  . HYPERTENSION 08/15/2006  . HYPOTHYROIDISM 08/15/2006  . MIGRAINE HEADACHE 10/16/2006  . Morbid obesity 10/16/2006  . OVARIAN CYST 12/20/2006  . SHINGLES, HX OF 12/20/2006  . TRANSIENT ISCHEMIC ATTACKS, HX OF 12/20/2006  . VITAMIN B12 DEFICIENCY 01/09/2007  . VERTIGO 09/15/2007  . Psoriasis  01/03/2011   Past Surgical History  Procedure Laterality Date  . Abdominal hysterectomy  02/2006  . Cholecystectomy    . Neck surgery      reports that she has quit smoking. She does not have any smokeless tobacco history on file. She reports that she does not drink alcohol or use illicit drugs. family history includes Diabetes in her other; Hypertension in her other. Allergies  Allergen Reactions  . Cefuroxime Axetil     REACTION: edema/swelling  . Ciprofloxacin Swelling  . Shellfish Allergy Swelling   Current Outpatient Prescriptions on File Prior to Visit  Medication Sig Dispense Refill  . aspirin 325 MG EC tablet Take 325 mg by mouth daily.        Marland Kitchen levothyroxine (SYNTHROID, LEVOTHROID) 125 MCG tablet Take 125 mcg by mouth daily.      Marland Kitchen menthol-zinc oxide (GOLD BOND) powder Apply 1 application topically 2 (two) times daily. Apply's to ears and scalp for medical needs.      Marland Kitchen omeprazole (PRILOSEC) 20 MG capsule Take 20 mg by mouth daily.       No current facility-administered medications on file prior to visit.   Review of Systems  Constitutional: Negative for unexpected weight change, or unusual diaphoresis  HENT: Negative for tinnitus.   Eyes: Negative for photophobia and visual disturbance.  Respiratory: Negative for choking and stridor.   Gastrointestinal: Negative for vomiting and blood in stool.  Genitourinary: Negative for hematuria and decreased urine  volume.  Musculoskeletal: Negative for acute joint swelling Skin: Negative for color change and wound.  Neurological: Negative for tremors and numbness other than noted  Psychiatric/Behavioral: Negative for decreased concentration or  hyperactivity.     Objective:   Physical Exam BP 122/76  Pulse 82  Temp(Src) 98.4 F (36.9 C) (Oral)  Ht 5\' 2"  (1.575 m)  Wt 214 lb (97.07 kg)  BMI 39.13 kg/m2  SpO2 97% VS noted, not ill appearing Constitutional: Pt appears well-developed and well-nourished.  HENT: Head: NCAT.   Right Ear: External ear normal.  Left Ear: External ear normal.  Eyes: Conjunctivae and EOM are normal. Pupils are equal, round, and reactive to light.  Neck: Normal range of motion. Neck supple.  Cardiovascular: Normal rate and regular rhythm.   Pulmonary/Chest: Effort normal and breath sounds normal.  Abd:  Soft, NT, non-distended, + BS Neurological: Pt is alert. Not confused ,motor 5/5 Skin: Skin is warm. No erythema. except for index finger left hand with small paronychia vs cyst lateral base Psychiatric: Pt behavior is normal. Thought content normal. 1-2+ nervous    Assessment & Plan:

## 2012-12-30 NOTE — Addendum Note (Signed)
Addended by: Sharon Seller B on: 12/30/2012 02:57 PM   Modules accepted: Orders

## 2012-12-30 NOTE — Patient Instructions (Addendum)
You had the flu shot today Please take all new medication as prescribed - the generic imitrex if not too expensive, fluoxetine 20 mg per day, and doxycycline for the finger Please continue all other medications as before, and refills have been done if requested - the BP meds Please have the pharmacy call with any other refills you may need.  Please remember to sign up for My Chart if you have not done so, as this will be important to you in the future with finding out test results, communicating by private email, and scheduling acute appointments online when needed.  Please keep your appointments with your specialists as you have planned - Dr Servando Salina

## 2012-12-30 NOTE — Assessment & Plan Note (Signed)
Mild worse recently, asks to re-start med - ok for prozac 20 qd

## 2012-12-30 NOTE — Assessment & Plan Note (Signed)
Mild to mod, for antibx course,  to f/u any worsening symptoms or concerns 

## 2013-02-21 ENCOUNTER — Ambulatory Visit (INDEPENDENT_AMBULATORY_CARE_PROVIDER_SITE_OTHER): Payer: Self-pay | Admitting: Physician Assistant

## 2013-02-21 ENCOUNTER — Encounter: Payer: Self-pay | Admitting: Physician Assistant

## 2013-02-21 VITALS — BP 140/90 | HR 59 | Temp 98.9°F | Resp 16 | Wt 209.5 lb

## 2013-02-21 DIAGNOSIS — J111 Influenza due to unidentified influenza virus with other respiratory manifestations: Secondary | ICD-10-CM | POA: Insufficient documentation

## 2013-02-21 DIAGNOSIS — R6889 Other general symptoms and signs: Secondary | ICD-10-CM

## 2013-02-21 LAB — POCT INFLUENZA A/B
Influenza A, POC: POSITIVE
Influenza B, POC: POSITIVE

## 2013-02-21 MED ORDER — HYDROCODONE-HOMATROPINE 5-1.5 MG/5ML PO SYRP: 5.0000 mL | ORAL_SOLUTION | Freq: Three times a day (TID) | ORAL | Status: DC | PRN

## 2013-02-21 NOTE — Progress Notes (Signed)
Pre visit review using our clinic review tool, if applicable. No additional management support is needed unless otherwise documented below in the visit note. 

## 2013-02-21 NOTE — Patient Instructions (Signed)
Please increase fluid intake.   Rest.  Saline nasal spray.  Mucinex for congestion.  Humidifier in bedroom.  Tylenol for pain.  Hot baths for muscle aches.  If you are unable to tolerate oral fluids or if symptoms severely worsen, please proceed to the ER.

## 2013-02-21 NOTE — Assessment & Plan Note (Signed)
Flu swab positive for influenza A and B. Unfortunately symptoms started greater than 48 hours ago. Tamiflu is not an option at this point. Increase fluid intake. Saline nasal spray. Tylenol, salt water gargles and Chloraseptic spray for sore throat. Tylenol for muscle aches. Baths for chills. Patient instructed on emergency symptoms and when to proceed to the emergency department.

## 2013-02-21 NOTE — Progress Notes (Signed)
Patient ID: Heather Dawson, female   DOB: 03-25-1955, 58 y.o.   MRN: [id number]  Patient presents to clinic today complaining of sudden onset of dry cough, fever, sore throat, nasal congestion, chills and body aches. Symptoms started 4 days ago.  Patient endorses being around relatives who tested positive for the flu. Patient denies shortness of breath or wheezing. Patient is currently tolerating by mouth fluids and is trying to stay well-hydrated. Patient denies stomach upset or diarrhea. Patient has taken some Tylenol for headache.  Past Medical History  Diagnosis Date  . Abdominal pain, epigastric 12/20/2006  . ALLERGIC RHINITIS 12/25/2006  . ANXIETY 10/16/2006  . BACK PAIN 06/15/2008  . BRADYCARDIA 05/04/2008  . Carpal tunnel syndrome 10/16/2006  . Cellulitis and abscess of other specified site 06/15/2008  . CHEST PAIN 09/08/2009  . CIRRHOSIS 10/16/2006  . COMMON MIGRAINE 12/20/2006  . CONTUSION, LOWER LEG 09/15/2008  . DEPRESSION 10/16/2006  . Esophageal reflux 12/25/2006  . HYPERLIPIDEMIA 12/25/2006  . HYPERTENSION 08/15/2006  . HYPOTHYROIDISM 08/15/2006  . MIGRAINE HEADACHE 10/16/2006  . Morbid obesity 10/16/2006  . OVARIAN CYST 12/20/2006  . SHINGLES, HX OF 12/20/2006  . TRANSIENT ISCHEMIC ATTACKS, HX OF 12/20/2006  . VITAMIN B12 DEFICIENCY 01/09/2007  . VERTIGO 09/15/2007  . Psoriasis 01/03/2011    Current Outpatient Prescriptions on File Prior to Visit  Medication Sig Dispense Refill  . amLODipine (NORVASC) 10 MG tablet Take 1 tablet (10 mg total) by mouth daily.  90 tablet  3  . aspirin 325 MG EC tablet Take 325 mg by mouth daily.        Marland Kitchen FLUoxetine (PROZAC) 20 MG capsule Take 1 capsule (20 mg total) by mouth daily.  30 capsule  11  . levothyroxine (SYNTHROID, LEVOTHROID) 125 MCG tablet Take 1 tablet (125 mcg total) by mouth daily.  90 tablet  3  . lisinopril (PRINIVIL,ZESTRIL) 10 MG tablet Take 1 tablet (10 mg total) by mouth daily.  90 tablet  3  . menthol-zinc oxide (GOLD BOND)  powder Apply 1 application topically 2 (two) times daily. Apply's to ears and scalp for medical needs.      Marland Kitchen omeprazole (PRILOSEC) 20 MG capsule Take 1 capsule (20 mg total) by mouth daily.  90 capsule  3  . SUMAtriptan (IMITREX) 100 MG tablet Take 1 tablet (100 mg total) by mouth every 2 (two) hours as needed for migraine or headache. May repeat in 2 hours if headache persists or recurs.  10 tablet  5  . doxycycline (VIBRA-TABS) 100 MG tablet Take 1 tablet (100 mg total) by mouth 2 (two) times daily.  20 tablet  0   No current facility-administered medications on file prior to visit.    Allergies  Allergen Reactions  . Cefuroxime Axetil     REACTION: edema/swelling  . Ciprofloxacin Swelling  . Shellfish Allergy Swelling    Family History  Problem Relation Age of Onset  . Diabetes Other   . Hypertension Other     History   Social History  . Marital Status: Married    Spouse Name: N/A    Number of Children: N/A  . Years of Education: N/A   Social History Main Topics  . Smoking status: Former Research scientist (life sciences)  . Smokeless tobacco: None  . Alcohol Use: No  . Drug Use: No  . Sexual Activity:    Other Topics Concern  . None   Social History Narrative  . None    Review of Systems - See HPI.  All other ROS are negative.  Filed Vitals:   02/21/13 1219  BP: 140/90  Pulse: 59  Temp: 98.9 F (37.2 C)  Resp: 16    Physical Exam  Vitals reviewed. Constitutional: She is oriented to person, place, and time and well-developed, well-nourished, and in no distress.  HENT:  Head: Normocephalic and atraumatic.  Right Ear: External ear normal.  Left Ear: External ear normal.  Nose: Nose normal.  Mouth/Throat: Oropharynx is clear and moist. No oropharyngeal exudate.  Tympanic membranes within normal limits bilaterally. Uvula midline. No tonsillar adenopathy or exudate noted on examination.  Eyes: Conjunctivae are normal. Pupils are equal, round, and reactive to light.  Neck: Neck  supple.  Cardiovascular: Normal rate, regular rhythm, normal heart sounds and intact distal pulses.   Pulmonary/Chest: Effort normal and breath sounds normal. No respiratory distress. She has no wheezes. She has no rales. She exhibits no tenderness.  Lymphadenopathy:    She has no cervical adenopathy.  Neurological: She is alert and oriented to person, place, and time. No cranial nerve deficit.  Skin: Skin is warm and dry. No rash noted.  Psychiatric: Affect normal.    Recent Results (from the past 2160 hour(s))  CBC     Status: None   Collection Time    12/20/12 11:53 AM      Result Value Range   WBC 8.9  4.0 - 10.5 K/uL   RBC 4.48  3.87 - 5.11 MIL/uL   Hemoglobin 14.8  12.0 - 15.0 g/dL   HCT 41.3  36.0 - 46.0 %   MCV 92.2  78.0 - 100.0 fL   MCH 33.0  26.0 - 34.0 pg   MCHC 35.8  30.0 - 36.0 g/dL   RDW 12.4  11.5 - 15.5 %   Platelets 209  150 - 400 K/uL  BASIC METABOLIC PANEL     Status: Abnormal   Collection Time    12/20/12 11:53 AM      Result Value Range   Sodium 137  135 - 145 mEq/L   Potassium 4.1  3.5 - 5.1 mEq/L   Chloride 104  96 - 112 mEq/L   CO2 22  19 - 32 mEq/L   Glucose, Bld 129 (*) 70 - 99 mg/dL   BUN 20  6 - 23 mg/dL   Creatinine, Ser 1.13 (*) 0.50 - 1.10 mg/dL   Calcium 9.1  8.4 - 10.5 mg/dL   GFR calc non Af Amer 53 (*) >90 mL/min   GFR calc Af Amer 62 (*) >90 mL/min   Comment: (NOTE)     The eGFR has been calculated using the CKD EPI equation.     This calculation has not been validated in all clinical situations.     eGFR's persistently <90 mL/min signify possible Chronic Kidney     Disease.  PROTIME-INR     Status: None   Collection Time    12/20/12 11:53 AM      Result Value Range   Prothrombin Time 12.6  11.6 - 15.2 seconds   INR 0.96  0.00 - 1.49  POCT INFLUENZA A/B     Status: Abnormal   Collection Time    02/21/13 12:27 PM      Result Value Range   Influenza A, POC Positive     Influenza B, POC Positive      Assessment/Plan: No  problem-specific assessment & plan notes found for this encounter.    

## 2013-04-22 ENCOUNTER — Other Ambulatory Visit: Payer: Self-pay | Admitting: Internal Medicine

## 2013-07-10 LAB — HM MAMMOGRAPHY

## 2013-07-14 ENCOUNTER — Ambulatory Visit (INDEPENDENT_AMBULATORY_CARE_PROVIDER_SITE_OTHER): Payer: BC Managed Care – PPO | Admitting: Internal Medicine

## 2013-07-14 ENCOUNTER — Other Ambulatory Visit: Payer: Self-pay | Admitting: Internal Medicine

## 2013-07-14 ENCOUNTER — Encounter: Payer: Self-pay | Admitting: Internal Medicine

## 2013-07-14 ENCOUNTER — Other Ambulatory Visit (INDEPENDENT_AMBULATORY_CARE_PROVIDER_SITE_OTHER): Payer: BC Managed Care – PPO

## 2013-07-14 VITALS — BP 140/100 | HR 73 | Temp 98.2°F | Wt 215.0 lb

## 2013-07-14 DIAGNOSIS — E119 Type 2 diabetes mellitus without complications: Secondary | ICD-10-CM

## 2013-07-14 DIAGNOSIS — I10 Essential (primary) hypertension: Secondary | ICD-10-CM

## 2013-07-14 DIAGNOSIS — Z0000 Encounter for general adult medical examination without abnormal findings: Secondary | ICD-10-CM

## 2013-07-14 DIAGNOSIS — N183 Chronic kidney disease, stage 3 unspecified: Secondary | ICD-10-CM

## 2013-07-14 LAB — HEPATIC FUNCTION PANEL
ALT: 52 U/L — ABNORMAL HIGH (ref 0–35)
AST: 28 U/L (ref 0–37)
Albumin: 3.8 g/dL (ref 3.5–5.2)
Alkaline Phosphatase: 61 U/L (ref 39–117)
Bilirubin, Direct: 0.1 mg/dL (ref 0.0–0.3)
Total Bilirubin: 0.6 mg/dL (ref 0.2–1.2)
Total Protein: 6.4 g/dL (ref 6.0–8.3)

## 2013-07-14 LAB — BASIC METABOLIC PANEL
BUN: 20 mg/dL (ref 6–23)
CO2: 27 mEq/L (ref 19–32)
Calcium: 9.1 mg/dL (ref 8.4–10.5)
Chloride: 108 mEq/L (ref 96–112)
Creatinine, Ser: 1.4 mg/dL — ABNORMAL HIGH (ref 0.4–1.2)
GFR: 41.94 mL/min — ABNORMAL LOW (ref >60.00)
Glucose, Bld: 129 mg/dL — ABNORMAL HIGH (ref 70–99)
Potassium: 4.4 mEq/L (ref 3.5–5.1)
Sodium: 142 mEq/L (ref 135–145)

## 2013-07-14 LAB — CBC WITH DIFFERENTIAL/PLATELET
Basophils Absolute: 0 10*3/uL (ref 0.0–0.1)
Basophils Relative: 0.3 % (ref 0.0–3.0)
Eosinophils Absolute: 0.4 10*3/uL (ref 0.0–0.7)
Eosinophils Relative: 5.8 % — ABNORMAL HIGH (ref 0.0–5.0)
HCT: 37.5 % (ref 36.0–46.0)
Hemoglobin: 12.7 g/dL (ref 12.0–15.0)
Lymphocytes Relative: 24.2 % (ref 12.0–46.0)
Lymphs Abs: 1.8 10*3/uL (ref 0.7–4.0)
MCHC: 33.8 g/dL (ref 30.0–36.0)
MCV: 97.5 fl (ref 78.0–100.0)
Monocytes Absolute: 0.5 10*3/uL (ref 0.1–1.0)
Monocytes Relative: 7.2 % (ref 3.0–12.0)
Neutro Abs: 4.7 10*3/uL (ref 1.4–7.7)
Neutrophils Relative %: 62.5 % (ref 43.0–77.0)
Platelets: 208 10*3/uL (ref 150.0–400.0)
RBC: 3.85 Mil/uL — ABNORMAL LOW (ref 3.87–5.11)
RDW: 15.5 % (ref 11.5–15.5)
WBC: 7.5 10*3/uL (ref 4.0–10.5)

## 2013-07-14 LAB — LIPID PANEL
Cholesterol: 219 mg/dL — ABNORMAL HIGH (ref 0–200)
HDL: 44.6 mg/dL (ref >39.00)
LDL Cholesterol: 156 mg/dL — ABNORMAL HIGH (ref 0–99)
Total CHOL/HDL Ratio: 5
Triglycerides: 90 mg/dL (ref 0.0–149.0)
VLDL: 18 mg/dL (ref 0.0–40.0)

## 2013-07-14 LAB — URINALYSIS, ROUTINE W REFLEX MICROSCOPIC
Bilirubin Urine: NEGATIVE
Hgb urine dipstick: NEGATIVE
Ketones, ur: NEGATIVE
Leukocytes, UA: NEGATIVE
Nitrite: NEGATIVE
RBC / HPF: NONE SEEN (ref 0–?)
Specific Gravity, Urine: 1.025 (ref 1.000–1.030)
Total Protein, Urine: NEGATIVE
Urine Glucose: NEGATIVE
Urobilinogen, UA: 0.2 (ref 0.0–1.0)
pH: 6 (ref 5.0–8.0)

## 2013-07-14 LAB — TSH: TSH: 1.11 u[IU]/mL (ref 0.35–4.50)

## 2013-07-14 LAB — MICROALBUMIN / CREATININE URINE RATIO
Creatinine,U: 145.3 mg/dL
Microalb Creat Ratio: 1.5 mg/g (ref 0.0–30.0)
Microalb, Ur: 2.2 mg/dL — ABNORMAL HIGH (ref 0.0–1.9)

## 2013-07-14 LAB — HEMOGLOBIN A1C: Hgb A1c MFr Bld: 7.1 % — ABNORMAL HIGH (ref 4.6–6.5)

## 2013-07-14 MED ORDER — LISINOPRIL 10 MG PO TABS: 10.0000 mg | ORAL_TABLET | Freq: Every day | ORAL | Status: DC

## 2013-07-14 MED ORDER — METHOTREXATE 2.5 MG PO TABS: ORAL_TABLET | ORAL | Status: DC

## 2013-07-14 MED ORDER — ATORVASTATIN CALCIUM 10 MG PO TABS: 10.0000 mg | ORAL_TABLET | Freq: Every day | ORAL | Status: DC

## 2013-07-14 NOTE — Progress Notes (Signed)
Pre visit review using our clinic review tool, if applicable. No additional management support is needed unless otherwise documented below in the visit note. 

## 2013-07-14 NOTE — Progress Notes (Signed)
Subjective:    Patient ID: Heather Dawson, female    DOB: November 27, 1955, 58 y.o.   MRN: [id number]  HPI    Here for wellness and f/u;  Overall doing ok;  Pt denies CP, worsening SOB, DOE, wheezing, orthopnea, PND, worsening LE edema, palpitations, dizziness or syncope.  Pt denies neurological change such as new headache, facial or extremity weakness.  Pt denies polydipsia, polyuria, or low sugar symptoms. Pt states overall good compliance with treatment and medications, good tolerability, and has been trying to follow lower cholesterol diet.  Pt denies worsening depressive symptoms, suicidal ideation or panic. No fever, night sweats, wt loss, loss of appetite, or other constitutional symptoms.  Pt states good ability with ADL's, has low fall risk, home safety reviewed and adequate, no other significant changes in hearing or vision, and only occasionally active with exercise.  Out of lisinopril 10 mg for 1 wk.  Has not had to take norvasc since lost wt previously.  Has not seen Dr Arty Baumgartner since 2011.  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  Had lost wt a few times to low of 164, but grad gained back to 215 now in past 2 yrs.  MTX helped psoriasis, takes now once per wk.  Checks BP at home.  Asks for husband to have generic sildenafil (he is pt here as well) Past Medical History  Diagnosis Date  . Abdominal pain, epigastric 12/20/2006  . ALLERGIC RHINITIS 12/25/2006  . ANXIETY 10/16/2006  . BACK PAIN 06/15/2008  . BRADYCARDIA 05/04/2008  . Carpal tunnel syndrome 10/16/2006  . Cellulitis and abscess of other specified site 06/15/2008  . CHEST PAIN 09/08/2009  . CIRRHOSIS 10/16/2006  . COMMON MIGRAINE 12/20/2006  . CONTUSION, LOWER LEG 09/15/2008  . DEPRESSION 10/16/2006  . Esophageal reflux 12/25/2006  . HYPERLIPIDEMIA 12/25/2006  . HYPERTENSION 08/15/2006  . HYPOTHYROIDISM 08/15/2006  . MIGRAINE HEADACHE 10/16/2006  . Morbid obesity 10/16/2006  . OVARIAN CYST 12/20/2006  .  SHINGLES, HX OF 12/20/2006  . TRANSIENT ISCHEMIC ATTACKS, HX OF 12/20/2006  . VITAMIN B12 DEFICIENCY 01/09/2007  . VERTIGO 09/15/2007  . Psoriasis 01/03/2011   Past Surgical History  Procedure Laterality Date  . Abdominal hysterectomy  02/2006  . Cholecystectomy    . Neck surgery      reports that she has quit smoking. She does not have any smokeless tobacco history on file. She reports that she does not drink alcohol or use illicit drugs. family history includes Diabetes in her other; Hypertension in her other. Allergies  Allergen Reactions  . Cefuroxime Axetil     REACTION: edema/swelling  . Ciprofloxacin Swelling  . Shellfish Allergy Swelling   Current Outpatient Prescriptions on File Prior to Visit  Medication Sig Dispense Refill  . amLODipine (NORVASC) 10 MG tablet Take 1 tablet (10 mg total) by mouth daily.  90 tablet  3  . aspirin 325 MG EC tablet Take 325 mg by mouth daily.        Marland Kitchen levothyroxine (SYNTHROID, LEVOTHROID) 112 MCG tablet TAKE ONE TABLET BY MOUTH EVERY DAY  90 tablet  3  . levothyroxine (SYNTHROID, LEVOTHROID) 125 MCG tablet Take 1 tablet (125 mcg total) by mouth daily.  90 tablet  3  . lisinopril (PRINIVIL,ZESTRIL) 10 MG tablet Take 1 tablet (10 mg total) by mouth daily.  90 tablet  3  . menthol-zinc oxide (GOLD BOND) powder Apply 1 application topically 2 (two) times daily. Apply's to ears and scalp  for medical needs.      Marland Kitchen omeprazole (PRILOSEC) 20 MG capsule Take 1 capsule (20 mg total) by mouth daily.  90 capsule  3  . SUMAtriptan (IMITREX) 100 MG tablet Take 1 tablet (100 mg total) by mouth every 2 (two) hours as needed for migraine or headache. May repeat in 2 hours if headache persists or recurs.  10 tablet  5   No current facility-administered medications on file prior to visit.   Review of Systems Constitutional: Negative for increased diaphoresis, other activity, appetite or other siginficant weight change  HENT: Negative for worsening hearing loss,  ear pain, facial swelling, mouth sores and neck stiffness.   Eyes: Negative for other worsening pain, redness or visual disturbance.  Respiratory: Negative for shortness of breath and wheezing.   Cardiovascular: Negative for chest pain and palpitations.  Gastrointestinal: Negative for diarrhea, blood in stool, abdominal distention or other pain Genitourinary: Negative for hematuria, flank pain or change in urine volume.  Musculoskeletal: Negative for myalgias or other joint complaints.  Skin: Negative for color change and wound.  Neurological: Negative for syncope and numbness. other than noted Hematological: Negative for adenopathy. or other swelling Psychiatric/Behavioral: Negative for hallucinations, self-injury, decreased concentration or other worsening agitation.      Objective:   Physical Exam BP 140/100  Pulse 73  Temp(Src) 98.2 F (36.8 C) (Oral)  Wt 215 lb (97.523 kg)  SpO2 96% VS noted,  Constitutional: Pt is oriented to person, place, and time. Appears well-developed and well-nourished.  Head: Normocephalic and atraumatic.  Right Ear: External ear normal.  Left Ear: External ear normal.  Nose: Nose normal.  Mouth/Throat: Oropharynx is clear and moist.  Eyes: Conjunctivae and EOM are normal. Pupils are equal, round, and reactive to light.  Neck: Normal range of motion. Neck supple. No JVD present. No tracheal deviation present.  Cardiovascular: Normal rate, regular rhythm, normal heart sounds and intact distal pulses.   Pulmonary/Chest: Effort normal and breath sounds without rales or wheezing  Abdominal: Soft. Bowel sounds are normal. NT. No HSM  Musculoskeletal: Normal range of motion. Exhibits no edema.  Lymphadenopathy:  Has no cervical adenopathy.  Neurological: Pt is alert and oriented to person, place, and time. Pt has normal reflexes. No cranial nerve deficit. Motor grossly intact Skin: Skin is warm and dry. No rash noted.  Psychiatric:  Has normal mood and  affect. Behavior is normal.     Assessment & Plan:

## 2013-07-14 NOTE — Patient Instructions (Addendum)
Please continue all other medications as before, and refills have been done if requested.  Please have the pharmacy call with any other refills you may need.  Please continue your efforts at being more active, low cholesterol diet, and weight control.  You will be contacted regarding the referral for: colonoscopy (please call your insurance to check on your copay)  [email or web address]  You will be contacted by phone if any changes need to be made immediately.  Otherwise, you will receive a letter about your results with an explanation, but please check with MyChart first.  Please remember to sign up for MyChart if you have not done so, as this will be important to you in the future with finding out test results, communicating by private email, and scheduling acute appointments online when needed.  Please return in 6 months, or sooner if needed, with Lab testing done 3-5 days before

## 2013-07-14 NOTE — Assessment & Plan Note (Signed)
For re-start acei

## 2013-07-14 NOTE — Assessment & Plan Note (Signed)
Consider f/u renal

## 2013-07-14 NOTE — Assessment & Plan Note (Signed)
stable overall by history and exam, recent data reviewed with pt, and pt to continue medical treatment as before,  to f/u any worsening symptoms or concerns Lab Results  Component Value Date   HGBA1C 5.8 01/01/2012    for f/u labs

## 2013-07-15 ENCOUNTER — Telehealth: Payer: Self-pay | Admitting: Internal Medicine

## 2013-07-15 NOTE — Telephone Encounter (Signed)
Relevant patient education mailed to patient.  

## 2013-07-17 ENCOUNTER — Encounter: Payer: Self-pay | Admitting: Gastroenterology

## 2013-08-03 ENCOUNTER — Telehealth: Payer: Self-pay

## 2013-08-03 MED ORDER — LOVASTATIN 20 MG PO TABS: 20.0000 mg | ORAL_TABLET | Freq: Every day | ORAL | Status: DC

## 2013-08-03 NOTE — Telephone Encounter (Signed)
Pt states that you prescribed her atorvastatin 10 mg, pt states that she no longer tolerate this medication because it gives her muscle aches.  Pt would like to try another cholesterol medication. Please advise

## 2013-08-03 NOTE — Telephone Encounter (Signed)
Done - changed to lovastatin 20 mg - which is on the $4 list as well at [facility]

## 2013-08-04 NOTE — Telephone Encounter (Signed)
Patient informed. 

## 2013-08-04 NOTE — Telephone Encounter (Signed)
Called left message to call back 

## 2013-08-04 NOTE — Telephone Encounter (Signed)
Called the patient and she does not want a statin as has caused muscle pain.

## 2013-08-04 NOTE — Telephone Encounter (Signed)
Fortunately, just b/c one statin might cause pain, does not mean all statins do this.  i would still encourage her to try the milder statin prescribed, thanks

## 2013-08-17 ENCOUNTER — Encounter: Payer: Self-pay | Admitting: Internal Medicine

## 2013-08-17 ENCOUNTER — Ambulatory Visit (INDEPENDENT_AMBULATORY_CARE_PROVIDER_SITE_OTHER): Payer: BC Managed Care – PPO | Admitting: Internal Medicine

## 2013-08-17 VITALS — BP 118/80 | HR 90 | Temp 98.3°F | Wt 207.6 lb

## 2013-08-17 DIAGNOSIS — J310 Chronic rhinitis: Secondary | ICD-10-CM

## 2013-08-17 DIAGNOSIS — J209 Acute bronchitis, unspecified: Secondary | ICD-10-CM

## 2013-08-17 MED ORDER — AZITHROMYCIN 250 MG PO TABS: ORAL_TABLET | ORAL | Status: DC

## 2013-08-17 MED ORDER — HYDROCODONE-HOMATROPINE 5-1.5 MG/5ML PO SYRP: 5.0000 mL | ORAL_SOLUTION | Freq: Four times a day (QID) | ORAL | Status: DC | PRN

## 2013-08-17 NOTE — Progress Notes (Signed)
Pre visit review using our clinic review tool, if applicable. No additional management support is needed unless otherwise documented below in the visit note. 

## 2013-08-17 NOTE — Progress Notes (Signed)
   Subjective:    Patient ID: Heather Dawson, female    DOB: 12-23-1955, 58 y.o.   MRN: [id number]  HPI Pt presents with 6 day history of worsening sore throat, rhinitis, head congestion and productive cough. The cough is the most bothersome symptom and is worse at night. She states that when she lays down at night she can feel her nasal secretions running down her throat. The cough is productive of green sputum. She has a subsequent hoarse voice from the cough. It has caused chest soreness and back soreness.   Pt is unsure what color her nasal secretions have been as she has not looked. She is having some light headedness from the head congestion but specifically denies maxillary or frontal sinus pressure and headaches. She is also having some hear fullness bilaterally, but denies tinnitus or otalgia.   The pt denies sick exposures. She does have environmental allergies and states she has itchy watery eyes as well. Every time she comes in from yard work she can't stop sneezing.   She has been taking mucinex x 3 days and a leftover hydrocodone/homatotropine syrup q 8 hours. She has had minimal relief.   Review of Systems  Constitutional: Negative for fever and chills.  HENT: Negative for ear discharge, ear pain and sinus pressure.   Respiratory: Negative for chest tightness, shortness of breath and wheezing.       Objective:   Physical Exam Gen.: Healthy and well-nourished in appearance. Alert, appropriate and cooperative throughout exam. Appears younger than stated age   Eyes: No corneal or conjunctival inflammation noted. Pupils equal round reactive to light and accommodation.   Ears: External  ear exam reveals no significant lesions or deformities. R canal with cerumen impaction, unable to visualize TM. L TM dull with sharp cone of light.  Nose: External nasal exam reveals no deformity or inflammation. Nasal mucosa are pink and moist. No lesions or polyps noted. No sinus tenderness upon  palpation.   Mouth: Oral mucosa and oropharynx reveal no lesions or exudates. Teeth in good repair. Hoarse voice quality.   Lungs: Normal respiratory effort; chest expands symmetrically. Lungs are clear to auscultation.  Heart: Normal rate and rhythm. Normal S1 and S2. No gallop, click, or rub. No murmur.  Lymph: No cervical, axillary lymphadenopathy present. Tender cervical lymph nodes upon palpation L>R.                                                                               Assessment & Plan:  #1 cough; most likely d/t PND. Would recommend zicam melts along with cough syrup or tessalon. At this time would not consider CXR as pt is not a smoker and has had symptoms for 6 days with other acte symptoms   #2 allergic rhinitis; would recommend nasal hygiene and PO antihistamine. Would give antibiotics (doxy or azithromycin) to be filled if no improvement in 72 hours.

## 2013-08-17 NOTE — Patient Instructions (Signed)

## 2013-08-17 NOTE — Progress Notes (Signed)
   Subjective:    Patient ID: Heather Dawson, female    DOB: 13-Mar-1955, 58 y.o.   MRN: [id number]  HPI    She has had a progressive sore throat associated with rhinitis ,congestion and productive cough over the last 6 days. The cough is worse at night. She can actually feel the postnasal drainage. The cough is productive of green sputum. The symptoms are associated with hoarseness; she attributes this to the cough. She also has developed musculoskeletal discomfort from the cough  She has not observed the nasal secretions. The congestion has been associated with some lightheadedness.   Mucinex with narcotic cough syrup has been of minimal benefit.  Review of Systems She denies frontal headache or maxillary sinus pain. There's no otalgia or otic discharge.  She denies fever, chills, sweats.  She has no associated shortness of breath and wheezing.      Objective:   Physical Exam General appearance: thin but good health ;well nourished; no acute distress or increased work of breathing is present.  No  lymphadenopathy about the head, neck, or axilla noted.   Eyes: No conjunctival inflammation or lid edema is present. There is no scleral icterus.  Ears:  External ear exam shows no significant lesions or deformities.  Otoscopic examination reveals clear canals, tympanic membranes are intact bilaterally without bulging, retraction, inflammation or discharge.  Nose:  External nasal examination shows no deformity or inflammation. Nasal mucosa are pink and moist without lesions or exudates. No septal dislocation or deviation.No obstruction to airflow.   Oral exam: Dental hygiene is good; lips and gums are healthy appearing.There is no oropharyngeal erythema or exudate noted.   Neck:  No deformities, thyromegaly, masses, or tenderness noted.   Supple with full range of motion without pain.   Heart:  Normal rate and regular rhythm. S1 and S2 normal without gallop, murmur, click, rub or other extra  sounds.   Lungs:Chest clear to auscultation; no wheezes, rhonchi,rales ,or rubs present.No increased work of breathing.    Extremities:  No cyanosis, edema, or clubbing  noted    Skin: Warm & dry         Assessment & Plan:  #1 acute bronchitis w/o bronchospasm #2 rhinitis Plan: See orders and recommendations

## 2013-08-25 ENCOUNTER — Telehealth: Payer: Self-pay | Admitting: *Deleted

## 2013-08-25 MED ORDER — SULFAMETHOXAZOLE-TRIMETHOPRIM 800-160 MG PO TABS: 1.0000 | ORAL_TABLET | Freq: Two times a day (BID) | ORAL | Status: DC

## 2013-08-25 NOTE — Telephone Encounter (Signed)
prob ok to change antibx, but noted she has allergy to cephalasporin and cipro  For septra bid course - done erx

## 2013-08-25 NOTE — Telephone Encounter (Signed)
Spoke with patient and she will call back to schedule an appointment to follow up with diabetes. Orders already placed. Diabetic bundle.

## 2013-08-25 NOTE — Telephone Encounter (Signed)
Called pt no answer LMOM with md response rx has been sent to [place]...Johny Chess

## 2013-08-25 NOTE — Telephone Encounter (Signed)
Pt stated she saw Dr. Linna Darner on 08/17/13 for cough. She has completed the antibiotic that he rx, but she still have a bad cough. Coughing up greenish phlegm, her voice hasn't came back yet. She denies fever or sob sxs. Requesting advisement from pcp...Johny Chess

## 2013-09-22 ENCOUNTER — Ambulatory Visit (AMBULATORY_SURGERY_CENTER): Payer: Self-pay | Admitting: *Deleted

## 2013-09-22 VITALS — Ht 63.0 in | Wt 208.2 lb

## 2013-09-22 DIAGNOSIS — Z1211 Encounter for screening for malignant neoplasm of colon: Secondary | ICD-10-CM

## 2013-09-22 MED ORDER — MOVIPREP 100 G PO SOLR: ORAL | Status: DC

## 2013-09-22 NOTE — Progress Notes (Signed)
No egg or soy allergy  No anesthesia or intubation problems per pt  No diet medications taken  Registered in [place]  Pt states that she has had some bleeding with her bowel movements recently.

## 2013-09-24 ENCOUNTER — Encounter: Payer: Self-pay | Admitting: Gastroenterology

## 2013-10-06 ENCOUNTER — Ambulatory Visit (AMBULATORY_SURGERY_CENTER): Payer: BC Managed Care – PPO | Admitting: Gastroenterology

## 2013-10-06 ENCOUNTER — Encounter: Payer: Self-pay | Admitting: Gastroenterology

## 2013-10-06 VITALS — BP 136/81 | HR 61 | Temp 97.4°F | Resp 12 | Ht 63.0 in | Wt 208.0 lb

## 2013-10-06 DIAGNOSIS — Z1211 Encounter for screening for malignant neoplasm of colon: Secondary | ICD-10-CM

## 2013-10-06 DIAGNOSIS — D126 Benign neoplasm of colon, unspecified: Secondary | ICD-10-CM

## 2013-10-06 MED ORDER — SODIUM CHLORIDE 0.9 % IV SOLN: 500.0000 mL | INTRAVENOUS | Status: DC

## 2013-10-06 NOTE — Patient Instructions (Signed)
YOU HAD AN ENDOSCOPIC PROCEDURE TODAY AT THE [facility]: Refer to the procedure report that was given to you for any specific questions about what was found during the examination.  If the procedure report does not answer your questions, please call your gastroenterologist to clarify.  If you requested that your care partner not be given the details of your procedure findings, then the procedure report has been included in a sealed envelope for you to review at your convenience later.  YOU SHOULD EXPECT: Some feelings of bloating in the abdomen. Passage of more gas than usual.  Walking can help get rid of the air that was put into your GI tract during the procedure and reduce the bloating. If you had a lower endoscopy (such as a colonoscopy or flexible sigmoidoscopy) you may notice spotting of blood in your stool or on the toilet paper. If you underwent a bowel prep for your procedure, then you may not have a normal bowel movement for a few days.  DIET: Your first meal following the procedure should be a light meal and then it is ok to progress to your normal diet.  A half-sandwich or bowl of soup is an example of a good first meal.  Heavy or fried foods are harder to digest and may make you feel nauseous or bloated.  Likewise meals heavy in dairy and vegetables can cause extra gas to form and this can also increase the bloating.  Drink plenty of fluids but you should avoid alcoholic beverages for 24 hours.  ACTIVITY: Your care partner should take you home directly after the procedure.  You should plan to take it easy, moving slowly for the rest of the day.  You can resume normal activity the day after the procedure however you should NOT DRIVE or use heavy machinery for 24 hours (because of the sedation medicines used during the test).    SYMPTOMS TO REPORT IMMEDIATELY: A gastroenterologist can be reached at any hour.  During normal business hours, 8:30 AM to 5:00 PM Monday through Friday,  call [phone].  After hours and on weekends, please call the GI answering service at [phone] who will take a message and have the physician on call contact you.   Following lower endoscopy (colonoscopy or flexible sigmoidoscopy):  Excessive amounts of blood in the stool  Significant tenderness or worsening of abdominal pains  Swelling of the abdomen that is new, acute  Fever of 100F or higher    FOLLOW UP: If any biopsies were taken you will be contacted by phone or by letter within the next 1-3 weeks.  Call your gastroenterologist if you have not heard about the biopsies in 3 weeks.  Our staff will call the home number listed on your records the next business day following your procedure to check on you and address any questions or concerns that you may have at that time regarding the information given to you following your procedure. This is a courtesy call and so if there is no answer at the home number and we have not heard from you through the emergency physician on call, we will assume that you have returned to your regular daily activities without incident.  SIGNATURES/CONFIDENTIALITY: You and/or your care partner have signed paperwork which will be entered into your electronic medical record.  These signatures attest to the fact that that the information above on your After Visit Summary has been reviewed and is understood.  Full responsibility of the confidentiality   of this discharge information lies with you and/or your care-partner.     

## 2013-10-06 NOTE — Progress Notes (Signed)
Called to room to assist during endoscopic procedure.  Patient ID and intended procedure confirmed with present staff. Received instructions for my participation in the procedure from the performing physician.  

## 2013-10-06 NOTE — Op Note (Addendum)
[facility]  [address] [place], [address]   COLONOSCOPY PROCEDURE REPORT  PATIENT: Carolyne, Zierke  MR#: [id number] BIRTHDATE: August 12, 1955 , 24  yrs. old GENDER: Female ENDOSCOPIST: Inda Castle, MD REFERRED BY: PROCEDURE DATE:  10/06/2013 PROCEDURE:   Colonoscopy with snare polypectomy First Screening Colonoscopy - Avg.  risk and is 50 yrs.  old or older - No.  Prior Negative Screening - Now for repeat screening. 10 or more years since last screening  History of Adenoma - Now for follow-up colonoscopy & has been > or = to 3 yrs.  N/A  Polyps Removed Today? Yes. ASA CLASS:   Class II INDICATIONS:Average risk patient for colon cancer. MEDICATIONS: MAC sedation, administered by CRNA and propofol (Diprivan) 400mg  IV  DESCRIPTION OF PROCEDURE:   After the risks benefits and alternatives of the procedure were thoroughly explained, informed consent was obtained.  A digital rectal exam revealed no abnormalities.   The LB [id number]  endoscope was introduced through the anus and advanced to the cecum, which was identified by both the appendix and ileocecal valve. No adverse events experienced.   The quality of the prep was excellent using Suprep The instrument was then slowly withdrawn as the colon was fully examined.      COLON FINDINGS: A sessile polyp measuring 4 mm in size was found in the ascending colon.  A polypectomy was performed with a cold snare.  The resection was complete and the polyp tissue was completely retrieved.   The colon was otherwise normal.  There was no diverticulosis, inflammation, polyps or cancers unless previously stated.  Retroflexed views revealed no abnormalities. The time to cecum=4 minutes 48 seconds.  Withdrawal time=6 minutes 50 seconds.  The scope was withdrawn and the procedure completed. COMPLICATIONS: There were no complications.  ENDOSCOPIC IMPRESSION: 1.   Sessile polyp measuring 4 mm in size was found in the  ascending colon; polypectomy was performed with a cold snare 2.   The colon was otherwise normal  RECOMMENDATIONS: If the polyp(s) removed today are proven to be adenomatous (pre-cancerous) polyps, you will need a repeat colonoscopy in 5 years.  Otherwise you should continue to follow colorectal cancer screening guidelines for "routine risk" patients with colonoscopy in 10 years.  You will receive a letter within 1-2 weeks with the results of your biopsy as well as final recommendations.  Please call my office if you have not received a letter after 3 weeks.   eSigned:  Inda Castle, MD 10/06/2013 9:42 AM Revised: 10/06/2013 9:42 AM  cc: Biagio Borg, MD   PATIENT NAME:  Matilda, Waldon MR#: [id number]

## 2013-10-06 NOTE — Progress Notes (Signed)
Report to PACU, RN, vss, BBS= Clear.  

## 2013-10-07 ENCOUNTER — Telehealth: Payer: Self-pay | Admitting: *Deleted

## 2013-10-07 NOTE — Telephone Encounter (Signed)
  Follow up Call-  Call back number 10/06/2013  Post procedure Call Back phone  # [phone]  Permission to leave phone message Yes     Patient questions:  Do you have a fever, pain , or abdominal swelling? No. Pain Score  0 *  Have you tolerated food without any problems? Yes.    Have you been able to return to your normal activities? Yes.    Do you have any questions about your discharge instructions: Diet   No. Medications  No. Follow up visit  No.  Do you have questions or concerns about your Care? No.  Actions: * If pain score is 4 or above: No action needed, pain <4.

## 2013-10-13 ENCOUNTER — Encounter: Payer: Self-pay | Admitting: Gastroenterology

## 2013-11-06 ENCOUNTER — Encounter: Payer: Self-pay | Admitting: Internal Medicine

## 2013-11-06 ENCOUNTER — Other Ambulatory Visit (INDEPENDENT_AMBULATORY_CARE_PROVIDER_SITE_OTHER): Payer: BC Managed Care – PPO

## 2013-11-06 ENCOUNTER — Ambulatory Visit (INDEPENDENT_AMBULATORY_CARE_PROVIDER_SITE_OTHER): Payer: BC Managed Care – PPO | Admitting: Internal Medicine

## 2013-11-06 VITALS — BP 140/92 | HR 66 | Temp 98.4°F | Wt 205.0 lb

## 2013-11-06 DIAGNOSIS — I10 Essential (primary) hypertension: Secondary | ICD-10-CM

## 2013-11-06 DIAGNOSIS — E119 Type 2 diabetes mellitus without complications: Secondary | ICD-10-CM

## 2013-11-06 DIAGNOSIS — Z23 Encounter for immunization: Secondary | ICD-10-CM

## 2013-11-06 DIAGNOSIS — R42 Dizziness and giddiness: Secondary | ICD-10-CM

## 2013-11-06 DIAGNOSIS — R7989 Other specified abnormal findings of blood chemistry: Secondary | ICD-10-CM

## 2013-11-06 DIAGNOSIS — K219 Gastro-esophageal reflux disease without esophagitis: Secondary | ICD-10-CM

## 2013-11-06 LAB — CBC WITH DIFFERENTIAL/PLATELET
Basophils Absolute: 0 10*3/uL (ref 0.0–0.1)
Basophils Relative: 0.2 % (ref 0.0–3.0)
Eosinophils Absolute: 0.3 10*3/uL (ref 0.0–0.7)
Eosinophils Relative: 4.7 % (ref 0.0–5.0)
HCT: 41.1 % (ref 36.0–46.0)
Hemoglobin: 13.8 g/dL (ref 12.0–15.0)
Lymphocytes Relative: 22.4 % (ref 12.0–46.0)
Lymphs Abs: 1.6 10*3/uL (ref 0.7–4.0)
MCHC: 33.6 g/dL (ref 30.0–36.0)
MCV: 98.3 fl (ref 78.0–100.0)
Monocytes Absolute: 0.3 10*3/uL (ref 0.1–1.0)
Monocytes Relative: 4.6 % (ref 3.0–12.0)
Neutro Abs: 4.8 10*3/uL (ref 1.4–7.7)
Neutrophils Relative %: 68.1 % (ref 43.0–77.0)
Platelets: 218 10*3/uL (ref 150.0–400.0)
RBC: 4.18 Mil/uL (ref 3.87–5.11)
RDW: 13.3 % (ref 11.5–15.5)
WBC: 7.1 10*3/uL (ref 4.0–10.5)

## 2013-11-06 LAB — URINALYSIS, ROUTINE W REFLEX MICROSCOPIC
Bilirubin Urine: NEGATIVE
Hgb urine dipstick: NEGATIVE
Ketones, ur: NEGATIVE
Leukocytes, UA: NEGATIVE
Nitrite: NEGATIVE
RBC / HPF: NONE SEEN (ref 0–?)
Specific Gravity, Urine: 1.01 (ref 1.000–1.030)
Total Protein, Urine: NEGATIVE
Urine Glucose: NEGATIVE
Urobilinogen, UA: 0.2 (ref 0.0–1.0)
WBC, UA: NONE SEEN (ref 0–?)
pH: 6 (ref 5.0–8.0)

## 2013-11-06 LAB — HEPATIC FUNCTION PANEL
ALT: 67 U/L — ABNORMAL HIGH (ref 0–35)
AST: 45 U/L — ABNORMAL HIGH (ref 0–37)
Albumin: 4.2 g/dL (ref 3.5–5.2)
Alkaline Phosphatase: 59 U/L (ref 39–117)
Bilirubin, Direct: 0 mg/dL (ref 0.0–0.3)
Total Bilirubin: 0.5 mg/dL (ref 0.2–1.2)
Total Protein: 7.1 g/dL (ref 6.0–8.3)

## 2013-11-06 LAB — BASIC METABOLIC PANEL
BUN: 19 mg/dL (ref 6–23)
CO2: 24 mEq/L (ref 19–32)
Calcium: 9.6 mg/dL (ref 8.4–10.5)
Chloride: 108 mEq/L (ref 96–112)
Creatinine, Ser: 1.6 mg/dL — ABNORMAL HIGH (ref 0.4–1.2)
GFR: 35.83 mL/min — ABNORMAL LOW (ref >60.00)
Glucose, Bld: 129 mg/dL — ABNORMAL HIGH (ref 70–99)
Potassium: 4.5 mEq/L (ref 3.5–5.1)
Sodium: 141 mEq/L (ref 135–145)

## 2013-11-06 LAB — LDL CHOLESTEROL, DIRECT: Direct LDL: 135 mg/dL

## 2013-11-06 LAB — LIPID PANEL
Cholesterol: 195 mg/dL (ref 0–200)
HDL: 34.4 mg/dL — ABNORMAL LOW (ref >39.00)
NonHDL: 160.6
Total CHOL/HDL Ratio: 6
Triglycerides: 328 mg/dL — ABNORMAL HIGH (ref 0.0–149.0)
VLDL: 65.6 mg/dL — ABNORMAL HIGH (ref 0.0–40.0)

## 2013-11-06 LAB — GLUCOSE, POCT (MANUAL RESULT ENTRY): POC Glucose: 131 mg/dl — AB (ref 70–99)

## 2013-11-06 LAB — SEDIMENTATION RATE: Sed Rate: 13 mm/hr (ref 0–22)

## 2013-11-06 LAB — HEMOGLOBIN A1C: Hgb A1c MFr Bld: 6.6 % — ABNORMAL HIGH (ref 4.6–6.5)

## 2013-11-06 MED ORDER — LOVASTATIN 20 MG PO TABS: 20.0000 mg | ORAL_TABLET | Freq: Every day | ORAL | Status: DC

## 2013-11-06 NOTE — Assessment & Plan Note (Addendum)
Etiology unclear, ? GI/vagal related, ECG reviewed as per emr, + mild orthostatic by VS, exam o/w benign, for lab eval as well  Note:  Total time for pt hx, exam, review of record with pt in the room, determination of diagnoses and plan for further eval and tx is > 40 min, with over 50% spent in coordination and counseling of patient

## 2013-11-06 NOTE — Progress Notes (Signed)
Pre visit review using our clinic review tool, if applicable. No additional management support is needed unless otherwise documented below in the visit note. 

## 2013-11-06 NOTE — Assessment & Plan Note (Signed)
stable overall by history and exam, recent data reviewed with pt, and pt to continue medical treatment as before,  to f/u any worsening symptoms or concerns BP Readings from Last 3 Encounters:  11/06/13 140/92  10/06/13 136/81  08/17/13 118/80

## 2013-11-06 NOTE — Progress Notes (Signed)
Subjective:    Patient ID: Heather Dawson, female    DOB: Nov 18, 1955, 58 y.o.   MRN: [id number]  HPI  Here with "not feeling good."  Had 2 episodes recently; one after mowing yard last tues, with feeling overheated, shaky, sweaty, tired, dizzy and lightheaded, afraid she might have syncope. better to rest, did not seek medical care. Another episode occurred 3 days ago, just walking the grocery store (after walking 1 mile prior that day for exercise) , had to stop and eat nabs, not sure if helpe, but seemed to calm her nerves. Both episodes assoc with migraine, left head, better with imitrex.  Had some imitrex for HA yest as wel, this am is ok. States appetite change., Denies worsening depressive symptoms, suicidal ideation, or panic; has ongoing anxiety, not increased recently.  Wt is down 3 lbs from  Last month. Does  Not want sweets like before. Wt Readings from Last 3 Encounters:  11/06/13 205 lb (92.987 kg)  10/06/13 208 lb (94.348 kg)  09/22/13 208 lb 3.2 oz (94.439 kg)  No fever, ST, cough, Pt denies chest pain, increased sob or doe, wheezing, orthopnea, PND, increased LE swelling, palpitations,  or syncope. Pt denies new neurological symptoms such as new headache, or facial or extremity weakness or numbness  / Pt denies polydipsia, polyuria, Did have some aching near left scapula , better with heat and ice pack last night. Had marked acid reflux as well yesterday, had to sleep in recliner to keep from coming back. Overall good compliance with treatment, and good medicine tolerability.    No rash or joint pain. Has some swelling of fingers in the AM, cant get rings off. Has been drinking plenty of fluids.  Only taking the PPI qod, trying to minimize use.  No recent constipatoin, in fact has some loose stool mild x 1 this am, no blood. Denies urinary symptoms such as dysuria, frequency, urgency, flank pain, hematuria or n/v, fever, chills.  Due for flu shot Past Medical History  Diagnosis Date  .  Abdominal pain, epigastric 12/20/2006  . ALLERGIC RHINITIS 12/25/2006  . BACK PAIN 06/15/2008  . BRADYCARDIA 05/04/2008  . Carpal tunnel syndrome 10/16/2006  . Cellulitis and abscess of other specified site 06/15/2008  . CHEST PAIN 09/08/2009  . CIRRHOSIS 10/16/2006  . COMMON MIGRAINE 12/20/2006  . CONTUSION, LOWER LEG 09/15/2008  . Esophageal reflux 12/25/2006  . HYPERLIPIDEMIA 12/25/2006  . HYPERTENSION 08/15/2006  . HYPOTHYROIDISM 08/15/2006  . MIGRAINE HEADACHE 10/16/2006  . Morbid obesity 10/16/2006  . OVARIAN CYST 12/20/2006  . SHINGLES, HX OF 12/20/2006  . TRANSIENT ISCHEMIC ATTACKS, HX OF 12/20/2006  . VITAMIN B12 DEFICIENCY 01/09/2007  . VERTIGO 09/15/2007  . Psoriasis 01/03/2011  . Allergy   . ANXIETY 10/16/2006    no per pt  . Blood transfusion without reported diagnosis   . DEPRESSION 10/16/2006    no per pt  . CKD (chronic kidney disease) stage 3, GFR 30-59 ml/min 10/16/2006    Qualifier: Diagnosis of  By: Jenny Reichmann MD, Hunt Oris   . CKD (chronic kidney disease)     2008- had Ecoli and caused renal failure, no problems since then  . Stroke     3 strokes 2008-no deficits, only on ASA   Past Surgical History  Procedure Laterality Date  . Abdominal hysterectomy  02/2006  . Cholecystectomy    . Neck surgery  2005  . Colonoscopy    . Cesarean section      reports that  she has quit smoking. She has never used smokeless tobacco. She reports that she does not drink alcohol or use illicit drugs. family history includes Diabetes in her other; Hypertension in her other. There is no history of Colon cancer, Esophageal cancer, Rectal cancer, or Stomach cancer. Allergies  Allergen Reactions  . Cefuroxime Axetil     REACTION: edema/swelling  . Ciprofloxacin Swelling  . Lipitor [Atorvastatin] Other (See Comments)    Myalgia   . Shellfish Allergy Swelling   Current Outpatient Prescriptions on File Prior to Visit  Medication Sig Dispense Refill  . aspirin 325 MG EC tablet Take 325 mg by  mouth daily.        . Calcium Carbonate (CALCIUM 600 PO) Take by mouth daily.      . Cholecalciferol (VITAMIN D3) 1000 UNITS CAPS Take by mouth daily.      . fexofenadine (ALLEGRA) 180 MG tablet Take 180 mg by mouth daily as needed for allergies or rhinitis.      . folic acid (FOLVITE) 1 MG tablet Take 1 mg by mouth daily.      Marland Kitchen levothyroxine (SYNTHROID, LEVOTHROID) 112 MCG tablet TAKE ONE TABLET BY MOUTH EVERY DAY  90 tablet  3  . lisinopril (PRINIVIL,ZESTRIL) 10 MG tablet Take 1 tablet (10 mg total) by mouth daily.  90 tablet  3  . methotrexate (RHEUMATREX) 2.5 MG tablet 3 tabs by mouth per wk  12 tablet  99  . omeprazole (PRILOSEC) 20 MG capsule Take 1 capsule (20 mg total) by mouth daily.  90 capsule  3  . SUMAtriptan (IMITREX) 100 MG tablet Take 1 tablet (100 mg total) by mouth every 2 (two) hours as needed for migraine or headache. May repeat in 2 hours if headache persists or recurs.  10 tablet  5  . atorvastatin (LIPITOR) 10 MG tablet        No current facility-administered medications on file prior to visit.   Review of Systems  Constitutional: Negative for unusual diaphoresis or other sweats  HENT: Negative for ringing in ear Eyes: Negative for double vision or worsening visual disturbance.  Respiratory: Negative for choking and stridor.   Gastrointestinal: Negative for vomiting or other signifcant bowel change Genitourinary: Negative for hematuria or decreased urine volume.  Musculoskeletal: Negative for other MSK pain or swelling Skin: Negative for color change and worsening wound.  Neurological: Negative for tremors and numbness other than noted  Psychiatric/Behavioral: Negative for decreased concentration or agitation other than above       Objective:   Physical Exam BP 138/90  Pulse 66  Temp(Src) 98.4 F (36.9 C) (Oral)  Wt 205 lb (92.987 kg)  SpO2 97% VS noted, including orthostatic VS Constitutional: Pt appears well-developed, well-nourished.  HENT: Head: NCAT.    Right Ear: External ear normal.  Left Ear: External ear normal.  Eyes: . Pupils are equal, round, and reactive to light. Conjunctivae and EOM are normal Neck: Normal range of motion. Neck supple.  Cardiovascular: Normal rate and regular rhythm.   Pulmonary/Chest: Effort normal and breath sounds normal.  Abd:  Soft, NT, ND, + BS, no flank tender Neurological: Pt is alert. Not confused , motor grossly intact Skin: Skin is warm. No rash Psychiatric: Pt behavior is normal. No agitation.     Assessment & Plan:

## 2013-11-06 NOTE — Assessment & Plan Note (Signed)
Diet controlled, for a1c, o/w stable overall by history and exam, recent data reviewed with pt, and pt to continue medical treatment as before,  to f/u any worsening symptoms or concerns Lab Results  Component Value Date   HGBA1C 7.1* 07/14/2013

## 2013-11-06 NOTE — Assessment & Plan Note (Signed)
To take PPI daily instead of qod,  to f/u any worsening symptoms or concerns

## 2013-11-06 NOTE — Patient Instructions (Addendum)
Your EKG was OK today  You had the flu shot today  Your blood pressure did drop somewhat with standing; please drink more fluids in the next few days as it appears you have some mild dehydration  Please take the omeprazole every day  Please continue all other medications as before, and refills have been done if requested.  Please have the pharmacy call with any other refills you may need.  Please keep your appointments with your specialists as you may have planned  Please go to the LAB in the Basement (turn left off the elevator) for the tests to be done today  You will be contacted by phone if any changes need to be made immediately.  Otherwise, you will receive a letter about your results with an explanation, but please check with MyChart first.  Please return in 6 months, or sooner if needed

## 2013-11-14 ENCOUNTER — Encounter: Payer: Self-pay | Admitting: Gastroenterology

## 2014-02-19 HISTORY — PX: ROTATOR CUFF REPAIR: SHX139

## 2014-03-24 LAB — HM DIABETES EYE EXAM

## 2014-04-28 ENCOUNTER — Ambulatory Visit (INDEPENDENT_AMBULATORY_CARE_PROVIDER_SITE_OTHER): Payer: BLUE CROSS/BLUE SHIELD | Admitting: Internal Medicine

## 2014-04-28 ENCOUNTER — Other Ambulatory Visit (INDEPENDENT_AMBULATORY_CARE_PROVIDER_SITE_OTHER): Payer: BLUE CROSS/BLUE SHIELD

## 2014-04-28 ENCOUNTER — Encounter: Payer: Self-pay | Admitting: Internal Medicine

## 2014-04-28 VITALS — BP 138/90 | HR 90 | Temp 98.3°F | Resp 18 | Ht 63.0 in | Wt 214.1 lb

## 2014-04-28 DIAGNOSIS — E119 Type 2 diabetes mellitus without complications: Secondary | ICD-10-CM

## 2014-04-28 DIAGNOSIS — G9519 Other vascular myelopathies: Secondary | ICD-10-CM

## 2014-04-28 DIAGNOSIS — M48062 Spinal stenosis, lumbar region with neurogenic claudication: Secondary | ICD-10-CM | POA: Insufficient documentation

## 2014-04-28 DIAGNOSIS — M4806 Spinal stenosis, lumbar region: Secondary | ICD-10-CM | POA: Diagnosis not present

## 2014-04-28 DIAGNOSIS — Z0000 Encounter for general adult medical examination without abnormal findings: Secondary | ICD-10-CM

## 2014-04-28 DIAGNOSIS — R7989 Other specified abnormal findings of blood chemistry: Secondary | ICD-10-CM

## 2014-04-28 LAB — URINALYSIS, ROUTINE W REFLEX MICROSCOPIC
Bilirubin Urine: NEGATIVE
Hgb urine dipstick: NEGATIVE
Ketones, ur: NEGATIVE
Leukocytes, UA: NEGATIVE
Nitrite: NEGATIVE
RBC / HPF: NONE SEEN (ref 0–?)
Specific Gravity, Urine: 1.025 (ref 1.000–1.030)
Total Protein, Urine: NEGATIVE
Urine Glucose: NEGATIVE
Urobilinogen, UA: 0.2 (ref 0.0–1.0)
pH: 5.5 (ref 5.0–8.0)

## 2014-04-28 LAB — HEPATIC FUNCTION PANEL
ALT: 38 U/L — ABNORMAL HIGH (ref 0–35)
AST: 20 U/L (ref 0–37)
Albumin: 4.3 g/dL (ref 3.5–5.2)
Alkaline Phosphatase: 77 U/L (ref 39–117)
Bilirubin, Direct: 0.1 mg/dL (ref 0.0–0.3)
Total Bilirubin: 0.4 mg/dL (ref 0.2–1.2)
Total Protein: 6.8 g/dL (ref 6.0–8.3)

## 2014-04-28 LAB — BASIC METABOLIC PANEL
BUN: 24 mg/dL — ABNORMAL HIGH (ref 6–23)
CO2: 26 mEq/L (ref 19–32)
Calcium: 9.5 mg/dL (ref 8.4–10.5)
Chloride: 107 mEq/L (ref 96–112)
Creatinine, Ser: 1.48 mg/dL — ABNORMAL HIGH (ref 0.40–1.20)
GFR: 38.58 mL/min — ABNORMAL LOW (ref >60.00)
Glucose, Bld: 138 mg/dL — ABNORMAL HIGH (ref 70–99)
Potassium: 4.3 mEq/L (ref 3.5–5.1)
Sodium: 139 mEq/L (ref 135–145)

## 2014-04-28 LAB — CBC WITH DIFFERENTIAL/PLATELET
Basophils Absolute: 0 10*3/uL (ref 0.0–0.1)
Basophils Relative: 0.3 % (ref 0.0–3.0)
Eosinophils Absolute: 0.5 10*3/uL (ref 0.0–0.7)
Eosinophils Relative: 5.9 % — ABNORMAL HIGH (ref 0.0–5.0)
HCT: 39.3 % (ref 36.0–46.0)
Hemoglobin: 13.4 g/dL (ref 12.0–15.0)
Lymphocytes Relative: 25.6 % (ref 12.0–46.0)
Lymphs Abs: 2 10*3/uL (ref 0.7–4.0)
MCHC: 34 g/dL (ref 30.0–36.0)
MCV: 95.1 fl (ref 78.0–100.0)
Monocytes Absolute: 0.5 10*3/uL (ref 0.1–1.0)
Monocytes Relative: 7 % (ref 3.0–12.0)
Neutro Abs: 4.8 10*3/uL (ref 1.4–7.7)
Neutrophils Relative %: 61.2 % (ref 43.0–77.0)
Platelets: 210 10*3/uL (ref 150.0–400.0)
RBC: 4.13 Mil/uL (ref 3.87–5.11)
RDW: 13.9 % (ref 11.5–15.5)
WBC: 7.8 10*3/uL (ref 4.0–10.5)

## 2014-04-28 LAB — HEMOGLOBIN A1C: Hgb A1c MFr Bld: 7.3 % — ABNORMAL HIGH (ref 4.6–6.5)

## 2014-04-28 LAB — LIPID PANEL
Cholesterol: 198 mg/dL (ref 0–200)
HDL: 39.2 mg/dL (ref >39.00)
NonHDL: 158.8
Total CHOL/HDL Ratio: 5
Triglycerides: 268 mg/dL — ABNORMAL HIGH (ref 0.0–149.0)
VLDL: 53.6 mg/dL — ABNORMAL HIGH (ref 0.0–40.0)

## 2014-04-28 LAB — TSH: TSH: 2.72 u[IU]/mL (ref 0.35–4.50)

## 2014-04-28 LAB — MICROALBUMIN / CREATININE URINE RATIO
Creatinine,U: 134 mg/dL
Microalb Creat Ratio: 0.9 mg/g (ref 0.0–30.0)
Microalb, Ur: 1.2 mg/dL (ref 0.0–1.9)

## 2014-04-28 LAB — LDL CHOLESTEROL, DIRECT: Direct LDL: 133 mg/dL

## 2014-04-28 MED ORDER — LEVOTHYROXINE SODIUM 112 MCG PO TABS: 112.0000 ug | ORAL_TABLET | Freq: Every day | ORAL | Status: DC

## 2014-04-28 MED ORDER — LOVASTATIN 20 MG PO TABS: 20.0000 mg | ORAL_TABLET | Freq: Every day | ORAL | Status: DC

## 2014-04-28 MED ORDER — LISINOPRIL 10 MG PO TABS: 10.0000 mg | ORAL_TABLET | Freq: Every day | ORAL | Status: DC

## 2014-04-28 MED ORDER — OMEPRAZOLE 20 MG PO CPDR: 20.0000 mg | DELAYED_RELEASE_CAPSULE | Freq: Every day | ORAL | Status: DC

## 2014-04-28 NOTE — Addendum Note (Signed)
Addended by: Valerie Salts on: 04/28/2014 02:24 PM   Modules accepted: Orders

## 2014-04-28 NOTE — Assessment & Plan Note (Signed)

## 2014-04-28 NOTE — Progress Notes (Signed)
Pre visit review using our clinic review tool, if applicable. No additional management support is needed unless otherwise documented below in the visit note. 

## 2014-04-28 NOTE — Assessment & Plan Note (Signed)
With 10 lb wt gain recent o/w stable overall by history and exam, recent data reviewed with pt, and pt to continue medical treatment as before,  to f/u any worsening symptoms or concerns, for f/u a1c

## 2014-04-28 NOTE — Addendum Note (Signed)
Addended by: Biagio Borg on: 04/28/2014 02:17 PM   Modules accepted: Orders

## 2014-04-28 NOTE — Progress Notes (Signed)
Subjective:    Patient ID: Heather Dawson, female    DOB: 1955-11-10, 59 y.o.   MRN: [id number]  HPI Here for wellness and f/u;  Overall doing ok;  Pt denies Chest pain, worsening SOB, DOE, wheezing, orthopnea, PND, worsening LE edema, palpitations, dizziness or syncope.  Pt denies neurological change such as new headache, facial or extremity weakness.  Pt denies polydipsia, polyuria, or low sugar symptoms. Pt states overall good compliance with treatment and medications, good tolerability, and has been trying to follow appropriate diet.  Pt denies worsening depressive symptoms, suicidal ideation or panic. No fever, night sweats, wt loss, loss of appetite, or other constitutional symptoms.  Pt states good ability with ADL's, has low fall risk, home safety reviewed and adequate, no other significant changes in hearing or vision, and only occasionally active with exercise.  Pt continues to have recurring low midline LBP, only with walking even in the mall x 2 laps with radiation to both hips and legs, sitting makes better,,no bowel or bladder change, fever, wt loss, with worsening LE pain/numbness - no weakness but had to quit after 2 laps around the yard,no other, gait change or falls.  Was walking 5 miles most days of the week about 2 yrs ago.  Also has some urinary odor, asks fo rUA. And also s/p right rot cuff repair jan 18 - Dr Lonn Georgia, getting PT twice per wk. Past Medical History  Diagnosis Date  . Abdominal pain, epigastric 12/20/2006  . ALLERGIC RHINITIS 12/25/2006  . BACK PAIN 06/15/2008  . BRADYCARDIA 05/04/2008  . Carpal tunnel syndrome 10/16/2006  . Cellulitis and abscess of other specified site 06/15/2008  . CHEST PAIN 09/08/2009  . CIRRHOSIS 10/16/2006  . COMMON MIGRAINE 12/20/2006  . CONTUSION, LOWER LEG 09/15/2008  . Esophageal reflux 12/25/2006  . HYPERLIPIDEMIA 12/25/2006  . HYPERTENSION 08/15/2006  . HYPOTHYROIDISM 08/15/2006  . MIGRAINE HEADACHE 10/16/2006  . Morbid obesity 10/16/2006   . OVARIAN CYST 12/20/2006  . SHINGLES, HX OF 12/20/2006  . TRANSIENT ISCHEMIC ATTACKS, HX OF 12/20/2006  . VITAMIN B12 DEFICIENCY 01/09/2007  . VERTIGO 09/15/2007  . Psoriasis 01/03/2011  . Allergy   . ANXIETY 10/16/2006    no per pt  . Blood transfusion without reported diagnosis   . DEPRESSION 10/16/2006    no per pt  . CKD (chronic kidney disease) stage 3, GFR 30-59 ml/min 10/16/2006    Qualifier: Diagnosis of  By: Jenny Reichmann MD, Hunt Oris   . CKD (chronic kidney disease)     2008- had Ecoli and caused renal failure, no problems since then  . Stroke     3 strokes 2008-no deficits, only on ASA   Past Surgical History  Procedure Laterality Date  . Abdominal hysterectomy  02/2006  . Cholecystectomy    . Neck surgery  2005  . Colonoscopy    . Cesarean section      reports that she has quit smoking. She has never used smokeless tobacco. She reports that she does not drink alcohol or use illicit drugs. family history includes Diabetes in her other; Hypertension in her other. There is no history of Colon cancer, Esophageal cancer, Rectal cancer, or Stomach cancer. Allergies  Allergen Reactions  . Cefuroxime Axetil     REACTION: edema/swelling  . Ciprofloxacin Swelling  . Lipitor [Atorvastatin] Other (See Comments)    Myalgia   . Shellfish Allergy Swelling   Current Outpatient Prescriptions on File Prior to Visit  Medication Sig Dispense Refill  . aspirin  325 MG EC tablet Take 325 mg by mouth daily.      . Calcium Carbonate (CALCIUM 600 PO) Take by mouth daily.    . Cholecalciferol (VITAMIN D3) 1000 UNITS CAPS Take by mouth daily.    . fexofenadine (ALLEGRA) 180 MG tablet Take 180 mg by mouth daily as needed for allergies or rhinitis.    . folic acid (FOLVITE) 1 MG tablet Take 1 mg by mouth daily.    Marland Kitchen levothyroxine (SYNTHROID, LEVOTHROID) 112 MCG tablet TAKE ONE TABLET BY MOUTH EVERY DAY 90 tablet 3  . lisinopril (PRINIVIL,ZESTRIL) 10 MG tablet Take 1 tablet (10 mg total) by mouth  daily. 90 tablet 3  . lovastatin (MEVACOR) 20 MG tablet Take 1 tablet (20 mg total) by mouth at bedtime. 90 tablet 3  . methotrexate (RHEUMATREX) 2.5 MG tablet 3 tabs by mouth per wk 12 tablet 99  . omeprazole (PRILOSEC) 20 MG capsule Take 1 capsule (20 mg total) by mouth daily. 90 capsule 3   No current facility-administered medications on file prior to visit.     Review of Systems Constitutional: Negative for increased diaphoresis, other activity, appetite or siginficant weight change other than noted HENT: Negative for worsening hearing loss, ear pain, facial swelling, mouth sores and neck stiffness.   Eyes: Negative for other worsening pain, redness or visual disturbance.  Respiratory: Negative for shortness of breath and wheezing  Cardiovascular: Negative for chest pain and palpitations.  Gastrointestinal: Negative for diarrhea, blood in stool, abdominal distention or other pain Genitourinary: Negative for hematuria, flank pain or change in urine volume.  Musculoskeletal: Negative for myalgias or other joint complaints.  Skin: Negative for color change and wound or drainage.  Neurological: Negative for syncope and numbness. other than noted Hematological: Negative for adenopathy. or other swelling Psychiatric/Behavioral: Negative for hallucinations, SI, self-injury, decreased concentration or other worsening agitation.      Objective:   Physical Exam BP 138/90 mmHg  Pulse 90  Temp(Src) 98.3 F (36.8 C) (Oral)  Resp 18  Ht 5\' 3"  (1.6 m)  Wt 214 lb 1.9 oz (97.124 kg)  BMI 37.94 kg/m2  SpO2 96% VS noted, RUE in sling after rot cuff surgury Constitutional: Pt is oriented to person, place, and time. Appears well-developed and well-nourished, in no significant distress, obese Head: Normocephalic and atraumatic.  Right Ear: External ear normal.  Left Ear: External ear normal.  Nose: Nose normal.  Mouth/Throat: Oropharynx is clear and moist.  Eyes: Conjunctivae and EOM are  normal. Pupils are equal, round, and reactive to light.  Neck: Normal range of motion. Neck supple. No JVD present. No tracheal deviation present or significant neck LA or mass Cardiovascular: Normal rate, regular rhythm, normal heart sounds and intact distal pulses.   Pulmonary/Chest: Effort normal and breath sounds without rales or wheezing  Abdominal: Soft. Bowel sounds are normal. NT. No HSM  Musculoskeletal: Normal range of motion. Exhibits no edema.  Lymphadenopathy:  Has no cervical adenopathy.  Neurological: Pt is alert and oriented to person, place, and time. Pt has normal reflexes. No cranial nerve deficit. Motor grossly intact, dtr symmetric, intact, as well as intact sens througout Skin: Skin is warm and dry. No rash noted.  Psychiatric:  Has normal mood and affect. Behavior is normal.  Spine nontender    Assessment & Plan:

## 2014-04-28 NOTE — Patient Instructions (Signed)
Please continue all other medications as before, and refills have been done if requested.  Please have the pharmacy call with any other refills you may need.  Please continue your efforts at being more active, low cholesterol diet, and weight control.  You are otherwise up to date with prevention measures today.  Please keep your appointments with your specialists as you may have planned  You will be contacted regarding the referral for: MRI for lower back, and orthopedic referral  Please go to the LAB in the Basement (turn left off the elevator) for the tests to be done today  You will be contacted by phone if any changes need to be made immediately.  Otherwise, you will receive a letter about your results with an explanation, but please check with MyChart first.  Please remember to sign up for MyChart if you have not done so, as this will be important to you in the future with finding out test results, communicating by private email, and scheduling acute appointments online when needed.  Please return in 6 months, or sooner if needed, with Lab testing done 3-5 days before

## 2014-04-28 NOTE — Assessment & Plan Note (Signed)
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

## 2014-05-11 ENCOUNTER — Telehealth: Payer: Self-pay | Admitting: Internal Medicine

## 2014-05-11 NOTE — Telephone Encounter (Signed)
States she is having back spasms.  States Dr. Jenny Reichmann referred her for an MRI.  She is requesting that it to be from middle of her back all the way down instead of just lower back.

## 2014-05-11 NOTE — Telephone Encounter (Signed)
Sorry, but this is not possible at this time, as this would require 2 MRI's

## 2014-05-12 NOTE — Telephone Encounter (Signed)
Pt called in and would like a call back from nurse today about this mri. i explained the below note to her but she needs to know if she can have a complete MRI.  Other dr feel like the problem is coming from her neck.     Best number [phone]

## 2014-05-13 NOTE — Telephone Encounter (Signed)
Is it possible for her to have another MRI since another doctor believes the problem is coming from her neck? Or just have her to ask the another doctor to order another MRI for her?

## 2014-05-13 NOTE — Telephone Encounter (Signed)
I cannot do that based on documentation from her last exam, which has to be quite detailed to get the insurance to cover it.  I can refer her to neurology if she likes

## 2014-05-16 ENCOUNTER — Ambulatory Visit
Admission: RE | Admit: 2014-05-16 | Discharge: 2014-05-16 | Disposition: A | Discharge status: Home / Self Care | Payer: BLUE CROSS/BLUE SHIELD | Source: Ambulatory Visit | Attending: Internal Medicine | Admitting: Internal Medicine

## 2014-05-16 DIAGNOSIS — G9519 Other vascular myelopathies: Secondary | ICD-10-CM

## 2014-05-16 DIAGNOSIS — M48062 Spinal stenosis, lumbar region with neurogenic claudication: Secondary | ICD-10-CM

## 2014-07-12 LAB — HM PAP SMEAR

## 2014-07-12 LAB — HM MAMMOGRAPHY

## 2014-07-14 ENCOUNTER — Ambulatory Visit (INDEPENDENT_AMBULATORY_CARE_PROVIDER_SITE_OTHER): Payer: BLUE CROSS/BLUE SHIELD | Admitting: Internal Medicine

## 2014-07-14 ENCOUNTER — Encounter: Payer: Self-pay | Admitting: Internal Medicine

## 2014-07-14 VITALS — BP 142/88 | HR 76 | Temp 98.3°F | Wt 203.0 lb

## 2014-07-14 DIAGNOSIS — G8929 Other chronic pain: Secondary | ICD-10-CM | POA: Diagnosis not present

## 2014-07-14 DIAGNOSIS — E119 Type 2 diabetes mellitus without complications: Secondary | ICD-10-CM | POA: Diagnosis not present

## 2014-07-14 DIAGNOSIS — M545 Low back pain, unspecified: Secondary | ICD-10-CM

## 2014-07-14 DIAGNOSIS — I10 Essential (primary) hypertension: Secondary | ICD-10-CM

## 2014-07-14 NOTE — Patient Instructions (Signed)
Please continue all other medications as before, and refills have been done if requested.  Please have the pharmacy call with any other refills you may need.  Please continue your efforts at being more active, low cholesterol diet, and weight control.  Please keep your appointments with your specialists as you may have planned  Please consider apply for Soc Sec Disability

## 2014-07-14 NOTE — Assessment & Plan Note (Signed)
stable overall by history and exam, recent data reviewed with pt, and pt to continue medical treatment as before,  to f/u any worsening symptoms or concerns BP Readings from Last 3 Encounters:  07/14/14 142/88  04/28/14 138/90  11/06/13 140/92

## 2014-07-14 NOTE — Assessment & Plan Note (Signed)
Chronic persistent, not expected to improve, very limited in activities even to do ADL's at home,  Agree with pt to apply for SS disability, cont pain control, consider f/u NS for c-spine, to wear back brace as directed, consider pain management long term

## 2014-07-14 NOTE — Progress Notes (Signed)
Subjective:    Patient ID: Heather Dawson, female    DOB: [birth year], 59 y.o.   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 To keep arms at her sides for the most part.  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 Was rec'd to Dr Poole/NS for the neck since this was a separate problem.  Has not been released per Dr Veverly Fells for the right shoulder yet as well, has fu July.  She asks me to "write her out for disability."  Pt continues to have recurring LBP without change in severity, bowel or bladder change, fever, wt loss,  worsening LE Dawson/numbness/weakness, gait change or falls, worse even with sweeping with broom or vacuum.  Otherwise Pt denies chest Dawson, increased sob or doe, wheezing, orthopnea, PND, increased LE swelling, palpitations, dizziness or syncope.,  Pt denies polydipsia, polyuria Past Medical History  Diagnosis Date  . Abdominal Dawson, epigastric 12/20/2006  . ALLERGIC RHINITIS 12/25/2006  . BACK Dawson 06/15/2008  . BRADYCARDIA 05/04/2008  . Carpal tunnel syndrome 10/16/2006  . Cellulitis and abscess of other specified site 06/15/2008  . CHEST Dawson 09/08/2009  . CIRRHOSIS 10/16/2006  . COMMON MIGRAINE 12/20/2006  . CONTUSION, LOWER LEG 09/15/2008  . Esophageal reflux 12/25/2006  . HYPERLIPIDEMIA 12/25/2006  . HYPERTENSION 08/15/2006  . HYPOTHYROIDISM 08/15/2006  . MIGRAINE HEADACHE 10/16/2006  . Morbid obesity 10/16/2006  . OVARIAN CYST 12/20/2006  . SHINGLES, HX OF 12/20/2006  . TRANSIENT ISCHEMIC ATTACKS, HX OF 12/20/2006  . VITAMIN B12 DEFICIENCY 01/09/2007  . VERTIGO 09/15/2007  . Psoriasis 01/03/2011  . Allergy   . ANXIETY 10/16/2006    no per pt  . Blood transfusion without  reported diagnosis   . DEPRESSION 10/16/2006    no per pt  . CKD (chronic kidney disease) stage 3, GFR 30-59 ml/min 10/16/2006    Qualifier: Diagnosis of  By: Jenny Reichmann MD, Hunt Oris   . CKD (chronic kidney disease)     2008- had Ecoli and caused renal failure, no problems since then  . Stroke     3 strokes 2008-no deficits, only on ASA   Past Surgical History  Procedure Laterality Date  . Abdominal hysterectomy  02/2006  . Cholecystectomy    . Neck surgery  2005  . Colonoscopy    . Cesarean section      reports that she has quit smoking. She has never used smokeless tobacco. She reports that she does not drink alcohol or use illicit drugs. family history includes Diabetes in her other; Hypertension in her other. There is no history of Colon cancer, Esophageal cancer, Rectal cancer, or Stomach cancer. Allergies  Allergen Reactions  . Cefuroxime Axetil     REACTION: edema/swelling  . Ciprofloxacin Swelling  . Lipitor [Atorvastatin] Other (See Comments)    Myalgia   . Shellfish Allergy Swelling   Current Outpatient Prescriptions on File Prior to Visit  Medication Sig Dispense Refill  . aspirin 325 MG EC tablet Take 325 mg by mouth daily.      . Calcium Carbonate (CALCIUM 600 PO) Take by mouth daily.    . Cholecalciferol (  VITAMIN D3) 1000 UNITS CAPS Take by mouth daily.    . fexofenadine (ALLEGRA) 180 MG tablet Take 180 mg by mouth daily as needed for allergies or rhinitis.    . folic acid (FOLVITE) 1 MG tablet Take 1 mg by mouth daily.    Marland Kitchen levothyroxine (SYNTHROID, LEVOTHROID) 112 MCG tablet Take 1 tablet (112 mcg total) by mouth daily. 90 tablet 3  . lisinopril (PRINIVIL,ZESTRIL) 10 MG tablet Take 1 tablet (10 mg total) by mouth daily. 90 tablet 3  . lovastatin (MEVACOR) 20 MG tablet Take 1 tablet (20 mg total) by mouth at bedtime. 90 tablet 3  . methotrexate (RHEUMATREX) 2.5 MG tablet 3 tabs by mouth per wk (Patient not taking: Reported on 07/14/2014) 12 tablet 99  . omeprazole  (PRILOSEC) 20 MG capsule Take 1 capsule (20 mg total) by mouth daily. 90 capsule 3   No current facility-administered medications on file prior to visit.    Review of Systems  Constitutional: Negative for unusual diaphoresis or night sweats HENT: Negative for ringing in ear or discharge Eyes: Negative for double vision or worsening visual disturbance.  Respiratory: Negative for choking and stridor.   Gastrointestinal: Negative for vomiting or other signifcant bowel change Genitourinary: Negative for hematuria or change in urine volume.  Musculoskeletal: Negative for other MSK Dawson or swelling Skin: Negative for color change and worsening wound.  Neurological: Negative for tremors and numbness other than noted  Psychiatric/Behavioral: Negative for decreased concentration or agitation other than above       Objective:   Physical Exam BP 142/88 mmHg  Pulse 76  Temp(Src) 98.3 F (36.8 C) (Oral)  Wt 203 lb (92.08 kg)  SpO2 95% VS noted,  Constitutional: Pt appears in no significant distress HENT: Head: NCAT.  Right Ear: External ear normal.  Left Ear: External ear normal.  Eyes: . Pupils are equal, round, and reactive to light. Conjunctivae and EOM are normal Neck: Normal range of motion. Neck supple.  Cardiovascular: Normal rate and regular rhythm.   Pulmonary/Chest: Effort normal and breath sounds without rales or wheezing.  Abd:  Soft, NT, ND, + BS Spine tender midline low cervical and lumbar Neurological: Pt is alert. Not confused , motor grossly intact Skin: Skin is warm. No rash, no LE edema Psychiatric: Pt behavior is normal. No agitation.     Assessment & Plan:

## 2014-07-14 NOTE — Assessment & Plan Note (Signed)
stable overall by history and exam, recent data reviewed with pt, and pt to continue medical treatment as before,  to f/u any worsening symptoms or concerns Lab Results  Component Value Date   HGBA1C 7.3* 04/28/2014

## 2014-07-14 NOTE — Progress Notes (Signed)
Pre visit review using our clinic review tool, if applicable. No additional management support is needed unless otherwise documented below in the visit note. 

## 2014-07-27 ENCOUNTER — Ambulatory Visit (INDEPENDENT_AMBULATORY_CARE_PROVIDER_SITE_OTHER): Payer: BLUE CROSS/BLUE SHIELD | Admitting: Internal Medicine

## 2014-07-27 ENCOUNTER — Encounter: Payer: Self-pay | Admitting: Internal Medicine

## 2014-07-27 VITALS — BP 134/84 | HR 75 | Temp 97.9°F | Wt 201.0 lb

## 2014-07-27 DIAGNOSIS — R42 Dizziness and giddiness: Secondary | ICD-10-CM | POA: Diagnosis not present

## 2014-07-27 DIAGNOSIS — I6521 Occlusion and stenosis of right carotid artery: Secondary | ICD-10-CM | POA: Diagnosis not present

## 2014-07-27 DIAGNOSIS — E119 Type 2 diabetes mellitus without complications: Secondary | ICD-10-CM

## 2014-07-27 DIAGNOSIS — I10 Essential (primary) hypertension: Secondary | ICD-10-CM | POA: Diagnosis not present

## 2014-07-27 DIAGNOSIS — E785 Hyperlipidemia, unspecified: Secondary | ICD-10-CM | POA: Diagnosis not present

## 2014-07-27 NOTE — Progress Notes (Signed)
Subjective:    Patient ID: Heather Dawson, female    DOB: [birth year], 59 y.o.   MRN: [id number]  HPI  Here to f/u -  Recent MRI with right carotid stenosis incidental finding, per ortho, details no clear.  Does c/o dizziness.  For SI joint injection per Dr Nelva Bush later this wk.  C/o dizziness, drunk feeling, stumbling, forgetfulness, wondering if related to carotid. Takes asa 81 mg daily. Pt denies chest pain, increased sob or doe, wheezing, orthopnea, PND, increased LE swelling, palpitations, dizziness or syncope.  Pt denies new neurological symptoms such as new headache, or facial or extremity weakness or numbness Past Medical History  Diagnosis Date  . Abdominal pain, epigastric 12/20/2006  . ALLERGIC RHINITIS 12/25/2006  . BACK PAIN 06/15/2008  . BRADYCARDIA 05/04/2008  . Carpal tunnel syndrome 10/16/2006  . Cellulitis and abscess of other specified site 06/15/2008  . CHEST PAIN 09/08/2009  . CIRRHOSIS 10/16/2006  . COMMON MIGRAINE 12/20/2006  . CONTUSION, LOWER LEG 09/15/2008  . Esophageal reflux 12/25/2006  . HYPERLIPIDEMIA 12/25/2006  . HYPERTENSION 08/15/2006  . HYPOTHYROIDISM 08/15/2006  . MIGRAINE HEADACHE 10/16/2006  . Morbid obesity 10/16/2006  . OVARIAN CYST 12/20/2006  . SHINGLES, HX OF 12/20/2006  . TRANSIENT ISCHEMIC ATTACKS, HX OF 12/20/2006  . VITAMIN B12 DEFICIENCY 01/09/2007  . VERTIGO 09/15/2007  . Psoriasis 01/03/2011  . Allergy   . ANXIETY 10/16/2006    no per pt  . Blood transfusion without reported diagnosis   . DEPRESSION 10/16/2006    no per pt  . CKD (chronic kidney disease) stage 3, GFR 30-59 ml/min 10/16/2006    Qualifier: Diagnosis of  By: Jenny Reichmann MD, Hunt Oris   . CKD (chronic kidney disease)     2008- had Ecoli and caused renal failure, no problems since then  . Stroke     3 strokes 2008-no deficits, only on ASA   Past Surgical History  Procedure Laterality Date  . Abdominal hysterectomy  02/2006  . Cholecystectomy    . Neck surgery  2005  . Colonoscopy    .  Cesarean section      reports that she has quit smoking. She has never used smokeless tobacco. She reports that she does not drink alcohol or use illicit drugs. family history includes Diabetes in her other; Hypertension in her other. There is no history of Colon cancer, Esophageal cancer, Rectal cancer, or Stomach cancer. Allergies  Allergen Reactions  . Cefuroxime Axetil     REACTION: edema/swelling  . Ciprofloxacin Swelling  . Lipitor [Atorvastatin] Other (See Comments)    Myalgia   . Shellfish Allergy Swelling   Current Outpatient Prescriptions on File Prior to Visit  Medication Sig Dispense Refill  . aspirin 325 MG EC tablet Take 325 mg by mouth daily.      . Calcium Carbonate (CALCIUM 600 PO) Take by mouth daily.    . Cholecalciferol (VITAMIN D3) 1000 UNITS CAPS Take by mouth daily.    . fexofenadine (ALLEGRA) 180 MG tablet Take 180 mg by mouth daily as needed for allergies or rhinitis.    Marland Kitchen levothyroxine (SYNTHROID, LEVOTHROID) 112 MCG tablet Take 1 tablet (112 mcg total) by mouth daily. 90 tablet 3  . lisinopril (PRINIVIL,ZESTRIL) 10 MG tablet Take 1 tablet (10 mg total) by mouth daily. 90 tablet 3  . lovastatin (MEVACOR) 20 MG tablet Take 1 tablet (20 mg total) by mouth at bedtime. 90 tablet 3  . omeprazole (PRILOSEC) 20 MG capsule Take 1 capsule (20 mg  total) by mouth daily. 90 capsule 3  . folic acid (FOLVITE) 1 MG tablet Take 1 mg by mouth daily.    . methotrexate (RHEUMATREX) 2.5 MG tablet 3 tabs by mouth per wk (Patient not taking: Reported on 07/14/2014) 12 tablet 99   No current facility-administered medications on file prior to visit.   Review of Systems  Constitutional: Negative for unusual diaphoresis or night sweats HENT: Negative for ringing in ear or discharge Eyes: Negative for double vision or worsening visual disturbance.  Respiratory: Negative for choking and stridor.   Gastrointestinal: Negative for vomiting or other signifcant bowel change Genitourinary:  Negative for hematuria or change in urine volume.  Musculoskeletal: Negative for other MSK pain or swelling Skin: Negative for color change and worsening wound.  Neurological: Negative for tremors and numbness other than noted  Psychiatric/Behavioral: Negative for decreased concentration or agitation other than above       Objective:   Physical Exam BP 134/84 mmHg  Pulse 75  Temp(Src) 97.9 F (36.6 C) (Oral)  Wt 201 lb (91.173 kg)  SpO2 96% VS noted,  Constitutional: Pt appears in no significant distress HENT: Head: NCAT.  Right Ear: External ear normal.  Left Ear: External ear normal.  Eyes: . Pupils are equal, round, and reactive to light. Conjunctivae and EOM are normal Neck: Normal range of motion. Neck supple.  Cardiovascular: Normal rate and regular rhythm.   Pulmonary/Chest: Effort normal and breath sounds without rales or wheezing.  Abd:  Soft, NT, ND, + BS Neurological: Pt is alert. Not confused , motor grossly intact Skin: Skin is warm. No rash, no LE edema Psychiatric: Pt behavior is normal. No agitation.     Assessment & Plan:

## 2014-07-27 NOTE — Patient Instructions (Signed)
Please continue all other medications as before, and refills have been done if requested.  Please have the pharmacy call with any other refills you may need.  Please continue your efforts at being more active, low cholesterol diet, and weight control.  Please keep your appointments with your specialists as you may have planned  You will be contacted regarding the referral for: carotid ultrasound

## 2014-07-27 NOTE — Progress Notes (Signed)
Pre visit review using our clinic review tool, if applicable. No additional management support is needed unless otherwise documented below in the visit note. 

## 2014-07-28 ENCOUNTER — Ambulatory Visit (HOSPITAL_COMMUNITY): Payer: BLUE CROSS/BLUE SHIELD | Attending: Cardiology

## 2014-07-28 ENCOUNTER — Other Ambulatory Visit: Payer: Self-pay | Admitting: *Deleted

## 2014-07-28 ENCOUNTER — Other Ambulatory Visit: Payer: Self-pay | Admitting: Internal Medicine

## 2014-07-28 DIAGNOSIS — I6521 Occlusion and stenosis of right carotid artery: Secondary | ICD-10-CM

## 2014-07-28 DIAGNOSIS — I6522 Occlusion and stenosis of left carotid artery: Secondary | ICD-10-CM

## 2014-07-28 DIAGNOSIS — I6523 Occlusion and stenosis of bilateral carotid arteries: Secondary | ICD-10-CM | POA: Diagnosis not present

## 2014-07-29 ENCOUNTER — Encounter: Payer: Self-pay | Admitting: Vascular Surgery

## 2014-07-29 ENCOUNTER — Telehealth: Payer: Self-pay | Admitting: Vascular Surgery

## 2014-07-29 ENCOUNTER — Ambulatory Visit (HOSPITAL_COMMUNITY)
Admission: RE | Admit: 2014-07-29 | Discharge: 2014-07-29 | Disposition: A | Discharge status: Home / Self Care | Payer: BLUE CROSS/BLUE SHIELD | Source: Ambulatory Visit | Attending: Vascular Surgery | Admitting: Vascular Surgery

## 2014-07-29 ENCOUNTER — Encounter (HOSPITAL_COMMUNITY): Payer: BLUE CROSS/BLUE SHIELD

## 2014-07-29 ENCOUNTER — Ambulatory Visit (INDEPENDENT_AMBULATORY_CARE_PROVIDER_SITE_OTHER): Payer: BLUE CROSS/BLUE SHIELD | Admitting: Vascular Surgery

## 2014-07-29 ENCOUNTER — Encounter: Payer: BLUE CROSS/BLUE SHIELD | Admitting: Vascular Surgery

## 2014-07-29 VITALS — BP 140/60 | HR 62 | Resp 18 | Ht 62.0 in | Wt 198.0 lb

## 2014-07-29 DIAGNOSIS — Z01810 Encounter for preprocedural cardiovascular examination: Secondary | ICD-10-CM

## 2014-07-29 DIAGNOSIS — I6522 Occlusion and stenosis of left carotid artery: Secondary | ICD-10-CM | POA: Diagnosis not present

## 2014-07-29 DIAGNOSIS — I6523 Occlusion and stenosis of bilateral carotid arteries: Secondary | ICD-10-CM

## 2014-07-29 NOTE — Progress Notes (Signed)
[facility] [place] HISTORY AND PHYSICAL   History of Present Illness:  Patient is a 59 y.o. who presents for evaluation of left internal carotid artery stenosis. The patient does have a prior history of stroke in 2009. She had a left hemiplegia at that time. This completely resolved after rehabilitation. She denies any recent symptoms of TIA amaurosis or stroke. She states she has had some short-term memory loss over the last 6 months. She thinks this may be attributable to the fact that she has been unemployed and not had much stimulation. She does occasionally have some dizziness and tends to stumble to the left this is worse if she leans her head over. She has had a previous cervical fusion done through a lower midline neck incision. She is a former smoker but quit 20 years ago. She recently had a carotid duplex performed at Dr. Gwynn Burly office which suggested greater than 80% left internal carotid artery stenosis. She denies any history of chest pain or shortness of breath. Other medical problems include multiple herniated cervical and lumbar disks, hyperlipidemia, hypertension, obesity all of which are currently stable. She does take aspirin daily. She is also on a statin.  Past Medical History  Diagnosis Date  . Abdominal pain, epigastric 12/20/2006  . ALLERGIC RHINITIS 12/25/2006  . BACK PAIN 06/15/2008  . BRADYCARDIA 05/04/2008  . Carpal tunnel syndrome 10/16/2006  . Cellulitis and abscess of other specified site 06/15/2008  . CHEST PAIN 09/08/2009  . CIRRHOSIS 10/16/2006  . COMMON MIGRAINE 12/20/2006  . CONTUSION, LOWER LEG 09/15/2008  . Esophageal reflux 12/25/2006  . HYPERLIPIDEMIA 12/25/2006  . HYPERTENSION 08/15/2006  . HYPOTHYROIDISM 08/15/2006  . MIGRAINE HEADACHE 10/16/2006  . Morbid obesity 10/16/2006  . OVARIAN CYST 12/20/2006  . SHINGLES, HX OF 12/20/2006  . TRANSIENT ISCHEMIC ATTACKS, HX OF 12/20/2006  . VITAMIN B12 DEFICIENCY 01/09/2007  . VERTIGO 09/15/2007  .  Psoriasis 01/03/2011  . Allergy   . ANXIETY 10/16/2006    no per pt  . Blood transfusion without reported diagnosis   . DEPRESSION 10/16/2006    no per pt  . CKD (chronic kidney disease) stage 3, GFR 30-59 ml/min 10/16/2006    Qualifier: Diagnosis of  By: Jenny Reichmann MD, Hunt Oris   . CKD (chronic kidney disease)     2008- had Ecoli and caused renal failure, no problems since then  . Stroke     3 strokes 2008-no deficits, only on ASA    Past Surgical History  Procedure Laterality Date  . Abdominal hysterectomy  02/2006  . Cholecystectomy    . Neck surgery  2005  . Colonoscopy    . Cesarean section      Social History History  Substance Use Topics  . Smoking status: Former Research scientist (life sciences)  . Smokeless tobacco: Never Used  . Alcohol Use: No    Family History Family History  Problem Relation Age of Onset  . Diabetes Other   . Hypertension Other   . Colon cancer Neg Hx   . Esophageal cancer Neg Hx   . Rectal cancer Neg Hx   . Stomach cancer Neg Hx     Allergies  Allergies  Allergen Reactions  . Cefuroxime Axetil     REACTION: edema/swelling  . Ciprofloxacin Swelling  . Lipitor [Atorvastatin] Other (See Comments)    Myalgia   . Shellfish Allergy Swelling     Current Outpatient Prescriptions  Medication Sig Dispense Refill  . aspirin 325 MG EC tablet Take 325 mg  by mouth daily.      . Calcium Carbonate (CALCIUM 600 PO) Take by mouth daily.    . Cholecalciferol (VITAMIN D3) 1000 UNITS CAPS Take by mouth daily.    . fexofenadine (ALLEGRA) 180 MG tablet Take 180 mg by mouth daily as needed for allergies or rhinitis.    Marland Kitchen levothyroxine (SYNTHROID, LEVOTHROID) 112 MCG tablet Take 1 tablet (112 mcg total) by mouth daily. 90 tablet 3  . lisinopril (PRINIVIL,ZESTRIL) 10 MG tablet Take 1 tablet (10 mg total) by mouth daily. 90 tablet 3  . lovastatin (MEVACOR) 20 MG tablet Take 1 tablet (20 mg total) by mouth at bedtime. 90 tablet 3  . omeprazole (PRILOSEC) 20 MG capsule Take 1 capsule  (20 mg total) by mouth daily. 90 capsule 3  . folic acid (FOLVITE) 1 MG tablet Take 1 mg by mouth daily.    . methotrexate (RHEUMATREX) 2.5 MG tablet 3 tabs by mouth per wk (Patient not taking: Reported on 07/14/2014) 12 tablet 99   No current facility-administered medications for this visit.    ROS:   General:  No weight loss, Fever, chills  HEENT: No recent headaches, no nasal bleeding, no visual changes, no sore throat  Neurologic: + dizziness, no blackouts, no seizures. No recent symptoms of stroke or mini- stroke. No recent episodes of slurred speech, or temporary blindness.  Cardiac: No recent episodes of chest pain/pressure, no shortness of breath at rest.  No shortness of breath with exertion.  Denies history of atrial fibrillation or irregular heartbeat  Vascular: No history of rest pain in feet.  No history of claudication.  No history of non-healing ulcer, No history of DVT   Pulmonary: No home oxygen, no productive cough, no hemoptysis,  No asthma or wheezing  Musculoskeletal:  [x]  Arthritis, [x ] Low back pain,  [x ] Joint pain  Hematologic:No history of hypercoagulable state.  No history of easy bleeding.  No history of anemia  Gastrointestinal: No hematochezia or melena,  No gastroesophageal reflux, no trouble swallowing  Urinary: [x ] chronic Kidney disease, [ ]  on HD - [ ]  MWF or [ ]  TTHS, [ ]  Burning with urination, [ ]  Frequent urination, [ ]  Difficulty urinating;   Skin: No rashes  Psychological: No history of anxiety,  No history of depression   Physical Examination  Filed Vitals:   07/29/14 1117 07/29/14 1119  BP: 145/85 140/60  Pulse: 62 62  Resp: 16 18  Height: 5\' 2"  (1.575 m)   Weight: 89.812 kg (198 lb)     Body mass index is 36.21 kg/(m^2).  General:  Alert and oriented, no acute distress HEENT: Normal Neck: No bruit or JVD Pulmonary: Clear to auscultation bilaterally Cardiac: Regular Rate and Rhythm without murmur Abdomen: Soft,  non-tender, non-distended, no mass Skin: No rash Extremity Pulses:  2+ radial, brachial, femoral, dorsalis pedis, posterior tibial pulses bilaterally Musculoskeletal: No deformity or edema  Neurologic: Upper and lower extremity motor 5/5 and symmetric  DATA:  Reviewed the carotid duplex scan from Dr. Gwynn Burly office. This showed greater than 80% stenosis of the left internal carotid artery with less than 40% stenosis of the right internal carotid artery with normal subclavian arteries and antegrade flow in the vertebrals bilaterally. We repeated her left carotid duplex today for operative planning purposes. This confirms again greater than 80% stenosis on the left side there was normal artery distal to the stenosis and this did not appear to be a high lesion   ASSESSMENT:  Patient with greater than 80% stenosis left internal carotid artery. I do not believe her memory loss or dizziness are related to this. Memory loss symptoms are not related to carotid occlusive disease. Dizziness is a rare symptom of carotid occlusive disease and only appears usually with multivessel involvement which she does not have. In light of this I believe she would benefit from left carotid endarterectomy for stroke prophylaxis. Risks benefits possible complications and procedure details were explained to the patient today including not limited to bleeding infection cranial nerve injury stroke risk of 1-2% myocardial events. We will schedule her for a preoperative cardiac risk stratification and then proceed with carotid endarterectomy based on the cardiac findings. The patient will continue her aspirin. Her left carotid endarterectomy will be scheduled for early July.   PLAN:  See above  Ruta Hinds, MD [facility] Office: [phone] Pager: [phone]

## 2014-07-29 NOTE — Progress Notes (Signed)
Filed Vitals:   07/29/14 1117 07/29/14 1119  BP: 145/85 140/60  Pulse: 62 62  Resp: 16 18  Height: 5\' 2"  (1.575 m)   Weight: 198 lb (89.812 kg)

## 2014-07-29 NOTE — Telephone Encounter (Signed)
Spoke with pt - Cardiac clearance appt 08/10/14 10:15 am with Dr. Gwenlyn Found [address] [facility] [address]. Pt verbalized understanding.

## 2014-07-30 NOTE — Addendum Note (Signed)
Addended by: Mena Goes on: 07/30/2014 04:37 PM   Modules accepted: Orders

## 2014-07-31 NOTE — Assessment & Plan Note (Signed)
Lab Results  Component Value Date   LDLCALC 156* 07/14/2013   Pt with severe reaction/CP prior with lipitor, declines other statin

## 2014-07-31 NOTE — Assessment & Plan Note (Signed)
Unclear etiology, chronic recurrent, exam benign,  to f/u any worsening symptoms or concerns

## 2014-07-31 NOTE — Assessment & Plan Note (Signed)
stable overall by history and exam, recent data reviewed with pt, and pt to continue medical treatment as before,  to f/u any worsening symptoms or concerns Lab Results  Component Value Date   HGBA1C 7.3* 04/28/2014

## 2014-07-31 NOTE — Assessment & Plan Note (Addendum)
Right per pt report, details not clear, for urgent caraotid u/s, if > 80% will need vascular evaluation, o/w asympt, cont asa

## 2014-07-31 NOTE — Assessment & Plan Note (Signed)
stable overall by history and exam, recent data reviewed with pt, and pt to continue medical treatment as before,  to f/u any worsening symptoms or concerns BP Readings from Last 3 Encounters:  07/29/14 140/60  07/27/14 134/84  07/14/14 142/88

## 2014-08-06 ENCOUNTER — Other Ambulatory Visit: Payer: Self-pay

## 2014-08-10 ENCOUNTER — Ambulatory Visit (INDEPENDENT_AMBULATORY_CARE_PROVIDER_SITE_OTHER): Payer: BLUE CROSS/BLUE SHIELD | Admitting: Cardiovascular Disease

## 2014-08-10 ENCOUNTER — Encounter: Payer: Self-pay | Admitting: Cardiovascular Disease

## 2014-08-10 VITALS — BP 146/74 | HR 50 | Ht 62.0 in | Wt 196.1 lb

## 2014-08-10 DIAGNOSIS — I779 Disorder of arteries and arterioles, unspecified: Secondary | ICD-10-CM

## 2014-08-10 DIAGNOSIS — Z79899 Other long term (current) drug therapy: Secondary | ICD-10-CM

## 2014-08-10 DIAGNOSIS — E785 Hyperlipidemia, unspecified: Secondary | ICD-10-CM

## 2014-08-10 DIAGNOSIS — Z01818 Encounter for other preprocedural examination: Secondary | ICD-10-CM | POA: Diagnosis not present

## 2014-08-10 DIAGNOSIS — I10 Essential (primary) hypertension: Secondary | ICD-10-CM

## 2014-08-10 DIAGNOSIS — I739 Peripheral vascular disease, unspecified: Secondary | ICD-10-CM

## 2014-08-10 NOTE — Assessment & Plan Note (Signed)
The patient was referred to me by Dr. Ruta Hinds for cardiovascular evaluation clearance prior to elective left carotid endarterectomy. She is a 59 year old female with a strong family history of heart disease. Both parents have mild cardiac infarctions after the brother. She has a history of treated hyperlipidemia and hypertension. She has never had a heart attack but has had prior strokes.she has an 80%  Asymptomatic left internal carotid artery stenosis. Dr. Oneida Alar feels that she requires endarterectomy. I'm going to obtain a pharmacologic Myoview stress test to risk stratify her.

## 2014-08-10 NOTE — Patient Instructions (Signed)
Medication Instructions:   Continue your current medicaitions  Labwork:  Please have Dr Jenny Reichmann fax Dr Gwenlyn Found a copy of your blood work that is due in August.   Testing/Procedures:  Leane Call- this is a test that looks at the blood flow to your heart muscle.  It takes approximately 2 1/2 hours. Please follow instruction sheet, as given.    Follow-Up:  6 months with Dr Gwenlyn Found  Any Other Special Instructions Will Be Listed Below (If Applicable).

## 2014-08-10 NOTE — Progress Notes (Signed)
08/10/2014 Heather Dawson   06-14-1955  [id number]  Primary Physician [name], MD Primary Cardiologist: Lorretta Harp MD Renae Gloss   HPI:  Heather Dawson is a very pleasant 59 year old mildly overweight married Caucasian female mother of 2 children, grandmother and 4 grandchildren referred by Dr. Oneida Alar for cardiovascular clearance prior to elective left carotid endarterectomy. Her primary care physician is Dr. [name]. Her cardiac risk factor profile is notable for treated hypertension and hyperlipidemia. Both her parents had myocardial infarctions and are deceased. Her brother had a myocardial infarction as well. 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 She has never had a heart attack. She denies chest pain or shortness of breath. She had carotid Dopplers performed recently that showed high-grade asymptomatic left internal carotid artery stenosis and was referred to Dr. Oneida Alar.   Current Outpatient Prescriptions  Medication Sig Dispense Refill  . aspirin 325 MG EC tablet Take 325 mg by mouth daily.      . Calcium Carbonate (CALCIUM 600 PO) Take by mouth daily.    . Cholecalciferol (VITAMIN D3) 1000 UNITS CAPS Take by mouth daily.    . fexofenadine (ALLEGRA) 180 MG tablet Take 180 mg by mouth daily as needed for allergies or rhinitis.    Marland Kitchen levothyroxine (SYNTHROID, LEVOTHROID) 112 MCG tablet Take 1 tablet (112 mcg total) by mouth daily. 90 tablet 3  . lisinopril (PRINIVIL,ZESTRIL) 10 MG tablet Take 1 tablet (10 mg total) by mouth daily. 90 tablet 3  . lovastatin (MEVACOR) 20 MG tablet Take 1 tablet (20 mg total) by mouth at bedtime. 90 tablet 3  . omeprazole (PRILOSEC) 20 MG capsule Take 1 capsule (20 mg total) by mouth daily. 90 capsule 3   No current facility-administered medications for this visit.    Allergies  Allergen Reactions  . Cefuroxime Axetil     REACTION: edema/swelling  . Ciprofloxacin Swelling  .  Lipitor [Atorvastatin] Other (See Comments)    Myalgia   . Shellfish Allergy Swelling    History   Social History  . Marital Status: Married    Spouse Name: N/A  . Number of Children: N/A  . Years of Education: N/A   Occupational History  . Not on file.   Social History Main Topics  . Smoking status: Former Research scientist (life sciences)  . Smokeless tobacco: Never Used  . Alcohol Use: No  . Drug Use: No  . Sexual Activity: Not on file   Other Topics Concern  . Not on file   Social History Narrative     Review of Systems: General: negative for chills, fever, night sweats or weight changes.  Cardiovascular: negative for chest pain, dyspnea on exertion, edema, orthopnea, palpitations, paroxysmal nocturnal dyspnea or shortness of breath Dermatological: negative for rash Respiratory: negative for cough or wheezing Urologic: negative for hematuria Abdominal: negative for nausea, vomiting, diarrhea, bright red blood per rectum, melena, or hematemesis Neurologic: negative for visual changes, syncope, or dizziness All other systems reviewed and are otherwise negative except as noted above.    Blood pressure 146/74, pulse 50, height 5\' 2"  (1.575 m), weight 196 lb 1.6 oz (88.95 kg).  General appearance: alert and no distress Neck: no adenopathy, no carotid bruit, no JVD, supple, symmetrical, trachea midline and thyroid not enlarged, symmetric, no tenderness/mass/nodules Lungs: clear to auscultation bilaterally Heart: regular rate and rhythm, S1, S2 normal, no murmur, click, rub or gallop Extremities: extremities normal, atraumatic, no cyanosis or edema  EKG  sinus bradycardia 50 without ST or T-wave changes. I personally reviewed this EKG  ASSESSMENT AND PLAN:   Left-sided carotid artery disease The patient was referred to me by Dr. Ruta Hinds for cardiovascular evaluation clearance prior to elective left carotid endarterectomy. She is a 59 year old female with a strong family history of heart  disease. Both parents have mild cardiac infarctions after the brother. She has a history of treated hyperlipidemia and hypertension. She has never had a heart attack but has had prior strokes.she has an 80%  Asymptomatic left internal carotid artery stenosis. Dr. Oneida Alar feels that she requires endarterectomy. I'm going to obtain a pharmacologic Myoview stress test to risk stratify her.      Lorretta Harp MD FACP,FACC,FAHA, [facility] 08/10/2014 11:25 AM

## 2014-08-11 ENCOUNTER — Encounter (HOSPITAL_COMMUNITY)
Admission: RE | Admit: 2014-08-11 | Discharge: 2014-08-11 | Disposition: A | Discharge status: Home / Self Care | Payer: BLUE CROSS/BLUE SHIELD | Source: Ambulatory Visit | Attending: Cardiovascular Disease | Admitting: Cardiovascular Disease

## 2014-08-11 DIAGNOSIS — E785 Hyperlipidemia, unspecified: Secondary | ICD-10-CM

## 2014-08-11 DIAGNOSIS — I10 Essential (primary) hypertension: Secondary | ICD-10-CM

## 2014-08-11 DIAGNOSIS — Z01818 Encounter for other preprocedural examination: Secondary | ICD-10-CM | POA: Diagnosis present

## 2014-08-11 DIAGNOSIS — R079 Chest pain, unspecified: Secondary | ICD-10-CM | POA: Diagnosis not present

## 2014-08-11 MED ORDER — TECHNETIUM TC 99M SESTAMIBI GENERIC - CARDIOLITE
10.0000 | Freq: Once | INTRAVENOUS | Status: AC | PRN
  Administered 2014-08-11: 10 via INTRAVENOUS

## 2014-08-11 MED ORDER — ACETAMINOPHEN 325 MG PO TABS
ORAL_TABLET | ORAL | Status: AC
  Filled 2014-08-11: qty 2

## 2014-08-11 MED ORDER — REGADENOSON 0.4 MG/5ML IV SOLN
0.4000 mg | Freq: Once | INTRAVENOUS | Status: AC
  Administered 2014-08-11: 0.4 mg via INTRAVENOUS

## 2014-08-11 MED ORDER — TECHNETIUM TC 99M SESTAMIBI GENERIC - CARDIOLITE
30.0000 | Freq: Once | INTRAVENOUS | Status: AC | PRN
  Administered 2014-08-11: 30 via INTRAVENOUS

## 2014-08-11 MED ORDER — ACETAMINOPHEN 325 MG PO TABS
650.0000 mg | ORAL_TABLET | Freq: Once | ORAL | Status: AC
  Administered 2014-08-11: 650 mg via ORAL

## 2014-08-11 MED ORDER — REGADENOSON 0.4 MG/5ML IV SOLN
INTRAVENOUS | Status: AC
  Filled 2014-08-11: qty 5

## 2014-08-13 ENCOUNTER — Telehealth: Payer: Self-pay | Admitting: Cardiovascular Disease

## 2014-08-13 NOTE — Telephone Encounter (Signed)
She is calling to see if pt is cleared for surgery on 08-25-14.

## 2014-08-13 NOTE — Telephone Encounter (Signed)
Requesting surgical clearance:   1. Type of surgery: elective left carotid endarterectomy  2. Surgeon: Dr Ruta Hinds  3. Surgical date: 08/25/14  4. Medications that need to be help: n/a  5. CAD: no strong family history of heart disease. Both parents have mild cardiac infarctions after the brother. She has a history of treated hyperlipidemia and hypertension. She has never had a heart attack but has had prior strokes.she has an 80% Asymptomatic left internal carotid artery stenosis.        Last nuclear study: 08/11/14 (found under imaging)     EF: 68% per nuc from 08/11/14  6. I will defer to: Dr Quay Burow

## 2014-08-13 NOTE — Telephone Encounter (Signed)
Message sent to Dr.Berry's nurse Curt Bears.

## 2014-08-16 ENCOUNTER — Other Ambulatory Visit (HOSPITAL_COMMUNITY): Payer: Self-pay | Admitting: *Deleted

## 2014-08-16 NOTE — Pre-Procedure Instructions (Signed)
    Heather Dawson  08/16/2014     Your procedure is scheduled on Wednesday, August 25, 2014 at 12:00 PM.   Report to [facility] Entrance "A" Admitting Office at 10:00 AM.   Call this number if you have problems the morning of surgery: [phone]   Any questions prior to day of surgery, please call [phone] between 8 & 4 PM.    Remember:  Do not eat food or drink liquids after midnight Tuesday, 08/24/14.  Take these medicines the morning of surgery with A SIP OF WATER: Aspirin, Levothyroxine (Synthroid), Omeprazole (Prilosec)   Do not wear jewelry, make-up or nail polish.  Do not wear lotions, powders, or perfumes.  You may wear deodorant.  Do not shave 48 hours prior to surgery.    Do not bring valuables to the hospital.  [facility] is not responsible for any belongings or valuables.  Contacts, dentures or bridgework may not be worn into surgery.  Leave your suitcase in the car.  After surgery it may be brought to your room.  For patients admitted to the hospital, discharge time will be determined by your treatment team.  Special instructions:  See "Preparing for Surgery" Instruction sheet.   Please read over the following fact sheets that you were given. Pain Booklet, Coughing and Deep Breathing, Blood Transfusion Information, MRSA Information and Surgical Site Infection Prevention

## 2014-08-16 NOTE — Telephone Encounter (Signed)
We'll obtain a pharmacologic Myoview stress test to risk stratify her prior to her elective carotid endarterectomy

## 2014-08-17 ENCOUNTER — Encounter (HOSPITAL_COMMUNITY): Payer: Self-pay

## 2014-08-17 ENCOUNTER — Telehealth: Payer: Self-pay | Admitting: Cardiovascular Disease

## 2014-08-17 ENCOUNTER — Other Ambulatory Visit: Payer: Self-pay

## 2014-08-17 ENCOUNTER — Encounter (HOSPITAL_COMMUNITY)
Admission: RE | Admit: 2014-08-17 | Discharge: 2014-08-17 | Disposition: A | Discharge status: Home / Self Care | Payer: BLUE CROSS/BLUE SHIELD | Source: Ambulatory Visit | Attending: Vascular Surgery | Admitting: Vascular Surgery

## 2014-08-17 DIAGNOSIS — Z0183 Encounter for blood typing: Secondary | ICD-10-CM | POA: Diagnosis not present

## 2014-08-17 DIAGNOSIS — I6522 Occlusion and stenosis of left carotid artery: Secondary | ICD-10-CM | POA: Diagnosis not present

## 2014-08-17 DIAGNOSIS — E785 Hyperlipidemia, unspecified: Secondary | ICD-10-CM | POA: Diagnosis not present

## 2014-08-17 DIAGNOSIS — N189 Chronic kidney disease, unspecified: Secondary | ICD-10-CM | POA: Insufficient documentation

## 2014-08-17 DIAGNOSIS — Z01818 Encounter for other preprocedural examination: Secondary | ICD-10-CM | POA: Diagnosis present

## 2014-08-17 DIAGNOSIS — Z01812 Encounter for preprocedural laboratory examination: Secondary | ICD-10-CM | POA: Insufficient documentation

## 2014-08-17 DIAGNOSIS — E538 Deficiency of other specified B group vitamins: Secondary | ICD-10-CM | POA: Diagnosis not present

## 2014-08-17 DIAGNOSIS — Z87891 Personal history of nicotine dependence: Secondary | ICD-10-CM | POA: Insufficient documentation

## 2014-08-17 DIAGNOSIS — I129 Hypertensive chronic kidney disease with stage 1 through stage 4 chronic kidney disease, or unspecified chronic kidney disease: Secondary | ICD-10-CM | POA: Insufficient documentation

## 2014-08-17 DIAGNOSIS — E039 Hypothyroidism, unspecified: Secondary | ICD-10-CM | POA: Diagnosis not present

## 2014-08-17 DIAGNOSIS — Z8673 Personal history of transient ischemic attack (TIA), and cerebral infarction without residual deficits: Secondary | ICD-10-CM | POA: Insufficient documentation

## 2014-08-17 HISTORY — DX: Unspecified osteoarthritis, unspecified site: M19.90

## 2014-08-17 HISTORY — DX: Low back pain, unspecified: M54.50

## 2014-08-17 HISTORY — DX: Low back pain: M54.5

## 2014-08-17 HISTORY — DX: Other chronic pain: G89.29

## 2014-08-17 HISTORY — DX: Other complications of anesthesia, initial encounter: T88.59XA

## 2014-08-17 HISTORY — DX: Adverse effect of unspecified anesthetic, initial encounter: T41.45XA

## 2014-08-17 HISTORY — DX: Acute kidney failure, unspecified: N17.9

## 2014-08-17 LAB — URINALYSIS, ROUTINE W REFLEX MICROSCOPIC
Bilirubin Urine: NEGATIVE
Glucose, UA: NEGATIVE mg/dL
Hgb urine dipstick: NEGATIVE
Ketones, ur: NEGATIVE mg/dL
Nitrite: POSITIVE — AB
Protein, ur: NEGATIVE mg/dL
Specific Gravity, Urine: 1.015 (ref 1.005–1.030)
Urobilinogen, UA: 0.2 mg/dL (ref 0.0–1.0)
pH: 6 (ref 5.0–8.0)

## 2014-08-17 LAB — CBC
HCT: 39.9 % (ref 36.0–46.0)
Hemoglobin: 13.3 g/dL (ref 12.0–15.0)
MCH: 32 pg (ref 26.0–34.0)
MCHC: 33.3 g/dL (ref 30.0–36.0)
MCV: 96.1 fL (ref 78.0–100.0)
Platelets: 181 10*3/uL (ref 150–400)
RBC: 4.15 MIL/uL (ref 3.87–5.11)
RDW: 12.7 % (ref 11.5–15.5)
WBC: 7.6 10*3/uL (ref 4.0–10.5)

## 2014-08-17 LAB — COMPREHENSIVE METABOLIC PANEL
ALT: 21 U/L (ref 14–54)
AST: 20 U/L (ref 15–41)
Albumin: 3.9 g/dL (ref 3.5–5.0)
Alkaline Phosphatase: 58 U/L (ref 38–126)
Anion gap: 8 (ref 5–15)
BUN: 21 mg/dL — ABNORMAL HIGH (ref 6–20)
CO2: 24 mmol/L (ref 22–32)
Calcium: 9.1 mg/dL (ref 8.9–10.3)
Chloride: 109 mmol/L (ref 101–111)
Creatinine, Ser: 1.59 mg/dL — ABNORMAL HIGH (ref 0.44–1.00)
GFR calc Af Amer: 41 mL/min — ABNORMAL LOW (ref >60)
GFR calc non Af Amer: 35 mL/min — ABNORMAL LOW (ref >60)
Glucose, Bld: 93 mg/dL (ref 65–99)
Potassium: 5.1 mmol/L (ref 3.5–5.1)
Sodium: 141 mmol/L (ref 135–145)
Total Bilirubin: 0.7 mg/dL (ref 0.3–1.2)
Total Protein: 6.4 g/dL — ABNORMAL LOW (ref 6.5–8.1)

## 2014-08-17 LAB — ABO/RH: ABO/RH(D): O POS

## 2014-08-17 LAB — URINE MICROSCOPIC-ADD ON

## 2014-08-17 LAB — APTT: aPTT: 29 seconds (ref 24–37)

## 2014-08-17 LAB — SURGICAL PCR SCREEN
MRSA, PCR: NEGATIVE
Staphylococcus aureus: NEGATIVE

## 2014-08-17 LAB — TYPE AND SCREEN
ABO/RH(D): O POS
Antibody Screen: NEGATIVE

## 2014-08-17 LAB — PROTIME-INR
INR: 0.99 (ref 0.00–1.49)
Prothrombin Time: 13.2 seconds (ref 11.6–15.2)

## 2014-08-17 NOTE — Progress Notes (Signed)
   08/17/14 0959  OBSTRUCTIVE SLEEP APNEA  Have you ever been diagnosed with sleep apnea through a sleep study? No (negative sleep study in the 90's)  Do you snore loudly (loud enough to be heard through closed doors)?  1  Do you often feel tired, fatigued, or sleepy during the daytime? 0  Has anyone observed you stop breathing during your sleep? 0  Do you have, or are you being treated for high blood pressure? 1  BMI more than 35 kg/m2? 1  Age over 33 years old? 1  Neck circumference greater than 40 cm/16 inches? 0  Gender: 0

## 2014-08-17 NOTE — Progress Notes (Signed)
STOP-Bang score 4-5; faxed copy to Dr [name], PCP

## 2014-08-17 NOTE — Telephone Encounter (Signed)
Spoke with Heather Dawson, they are looking for clearance and results of nuclear testing done at the hosp 08-11-14. Spoke with sharon in radiology at [place], they are going to research and call me back.

## 2014-08-17 NOTE — Telephone Encounter (Signed)
Spoke with cheryl at [phone]. She is faxing the report for dr berry to review.

## 2014-08-17 NOTE — Telephone Encounter (Signed)
Faxed nuclear report from 08-11-14 reviewed by dr berry and patient is cleared for upcoming surgery. Will forward to [facility].

## 2014-08-18 ENCOUNTER — Other Ambulatory Visit: Payer: Self-pay

## 2014-08-18 DIAGNOSIS — N390 Urinary tract infection, site not specified: Secondary | ICD-10-CM

## 2014-08-18 MED ORDER — SULFAMETHOXAZOLE-TRIMETHOPRIM 800-160 MG PO TABS: 1.0000 | ORAL_TABLET | Freq: Two times a day (BID) | ORAL | Status: DC

## 2014-08-18 NOTE — Progress Notes (Signed)
Rec'd phone call from Levada Dy, NP, with Anesthesia Dept. re: pt's pre-op UA was positive for UTI.  Phone call to pt.  Advised of UA results indicating a UTI.  Denied any burning with urination, urgency, or frequency.  Advised will send Rx to her pharmacy for Bactrim DS 1 tablet po, BID x 5 days. (per s.o. with hx of Cipro allergy) Advised pt. to drink a lot of water when taking the antibiotic, and to take all the tablets, until completely gone.  Will order stat UA the AM of surgery on 08/25/14.  Pt. Verb. Understanding.

## 2014-08-18 NOTE — Progress Notes (Signed)
Anesthesia Chart Review:  Pt is 59 year old female scheduled for L CEA on 08/25/2014 with Dr. Oneida Alar.   PMH includes: Stroke (2008), HTN, hyperlipidemia, CKD (stage 3), TIA, cirrhosis, bradycardia, hypothyroidism, vitamin B12 deficiency. Former smoker. BMI 36.   Preoperative labs reviewed.  Cr 1.59, consistent with results from last 6 months; pt has CKD. UA results consistent with UTI. Arbie Cookey in Dr. Oneida Alar' office aware and has ordered pt Bactrim.   EKG 08/10/2014: sinus bradycardia (50 bpm)  Nuclear stress test 08/11/2014: 1. No reversible ischemia or infarction. 2. Normal left ventricular wall motion. 3. Left ventricular ejection fraction 68% 4. Low-risk stress test findings  Carotid duplex US 07/28/2014:  -Mixed plaque in the bilateral carotid arteries. -Stable 1-39% RICA stenosis. -Progression of LICA stenosis, now in the >80% range. -Normal subclavian arteries, bilaterally. -Patent vertebral arteries with antegrade flow  Pt saw Dr. Gwenlyn Found 08/10/14 for cardiac pre-op eval; Dr. Gwenlyn Found ordered stress test which is low risk (see above).   If no changes, I anticipate pt can proceed with surgery as scheduled.   Willeen Cass, FNP-BC [facility]/Anesthesiology Phone: [phone] 08/18/2014 4:31 PM

## 2014-08-20 ENCOUNTER — Inpatient Hospital Stay (HOSPITAL_COMMUNITY): Admission: RE | Admit: 2014-08-20 | Payer: BLUE CROSS/BLUE SHIELD | Source: Ambulatory Visit

## 2014-08-23 NOTE — Telephone Encounter (Signed)
Please see additional telephone encounter.  Patient recently had a myoview that was low risk.  Dr Gwenlyn Found cleared her for surgery.

## 2014-08-25 ENCOUNTER — Encounter (HOSPITAL_COMMUNITY): Payer: Self-pay | Admitting: Certified Registered Nurse Anesthetist

## 2014-08-25 ENCOUNTER — Inpatient Hospital Stay (HOSPITAL_COMMUNITY)
Admission: RE | Admit: 2014-08-25 | Discharge: 2014-08-26 | DRG: 039 | Disposition: A | Discharge status: Home / Self Care | Payer: BLUE CROSS/BLUE SHIELD | Source: Ambulatory Visit | Attending: Vascular Surgery | Admitting: Vascular Surgery

## 2014-08-25 ENCOUNTER — Inpatient Hospital Stay (HOSPITAL_COMMUNITY): Payer: BLUE CROSS/BLUE SHIELD | Admitting: Anesthesiology

## 2014-08-25 ENCOUNTER — Telehealth: Payer: Self-pay | Admitting: Vascular Surgery

## 2014-08-25 ENCOUNTER — Encounter (HOSPITAL_COMMUNITY)
Admission: RE | Disposition: A | Discharge status: Home / Self Care | Payer: Self-pay | Source: Ambulatory Visit | Attending: Vascular Surgery

## 2014-08-25 ENCOUNTER — Inpatient Hospital Stay (HOSPITAL_COMMUNITY): Payer: BLUE CROSS/BLUE SHIELD | Admitting: Emergency Medicine

## 2014-08-25 DIAGNOSIS — Z7982 Long term (current) use of aspirin: Secondary | ICD-10-CM

## 2014-08-25 DIAGNOSIS — Z6836 Body mass index (BMI) 36.0-36.9, adult: Secondary | ICD-10-CM

## 2014-08-25 DIAGNOSIS — I10 Essential (primary) hypertension: Secondary | ICD-10-CM | POA: Diagnosis present

## 2014-08-25 DIAGNOSIS — Z91013 Allergy to seafood: Secondary | ICD-10-CM

## 2014-08-25 DIAGNOSIS — Z981 Arthrodesis status: Secondary | ICD-10-CM

## 2014-08-25 DIAGNOSIS — E039 Hypothyroidism, unspecified: Secondary | ICD-10-CM | POA: Diagnosis present

## 2014-08-25 DIAGNOSIS — I6522 Occlusion and stenosis of left carotid artery: Secondary | ICD-10-CM | POA: Diagnosis not present

## 2014-08-25 DIAGNOSIS — Z8673 Personal history of transient ischemic attack (TIA), and cerebral infarction without residual deficits: Secondary | ICD-10-CM

## 2014-08-25 DIAGNOSIS — K219 Gastro-esophageal reflux disease without esophagitis: Secondary | ICD-10-CM | POA: Diagnosis present

## 2014-08-25 DIAGNOSIS — Z79899 Other long term (current) drug therapy: Secondary | ICD-10-CM | POA: Diagnosis not present

## 2014-08-25 DIAGNOSIS — Z888 Allergy status to other drugs, medicaments and biological substances status: Secondary | ICD-10-CM | POA: Diagnosis not present

## 2014-08-25 DIAGNOSIS — Z881 Allergy status to other antibiotic agents status: Secondary | ICD-10-CM | POA: Diagnosis not present

## 2014-08-25 DIAGNOSIS — Z87891 Personal history of nicotine dependence: Secondary | ICD-10-CM

## 2014-08-25 HISTORY — PX: ENDARTERECTOMY: SHX5162

## 2014-08-25 LAB — CBC
HCT: 35.8 % — ABNORMAL LOW (ref 36.0–46.0)
Hemoglobin: 11.8 g/dL — ABNORMAL LOW (ref 12.0–15.0)
MCH: 31.9 pg (ref 26.0–34.0)
MCHC: 33 g/dL (ref 30.0–36.0)
MCV: 96.8 fL (ref 78.0–100.0)
Platelets: 165 10*3/uL (ref 150–400)
RBC: 3.7 MIL/uL — ABNORMAL LOW (ref 3.87–5.11)
RDW: 12.6 % (ref 11.5–15.5)
WBC: 7.9 10*3/uL (ref 4.0–10.5)

## 2014-08-25 LAB — URINALYSIS, ROUTINE W REFLEX MICROSCOPIC
Glucose, UA: NEGATIVE mg/dL
Hgb urine dipstick: NEGATIVE
Ketones, ur: NEGATIVE mg/dL
Leukocytes, UA: NEGATIVE
Nitrite: NEGATIVE
Protein, ur: NEGATIVE mg/dL
Specific Gravity, Urine: 1.021 (ref 1.005–1.030)
Urobilinogen, UA: 0.2 mg/dL (ref 0.0–1.0)
pH: 5.5 (ref 5.0–8.0)

## 2014-08-25 LAB — CREATININE, SERUM
Creatinine, Ser: 1.92 mg/dL — ABNORMAL HIGH (ref 0.44–1.00)
GFR calc Af Amer: 32 mL/min — ABNORMAL LOW (ref >60)
GFR calc non Af Amer: 28 mL/min — ABNORMAL LOW (ref >60)

## 2014-08-25 SURGERY — ENDARTERECTOMY, CAROTID
Anesthesia: General | Site: Neck | Laterality: Left

## 2014-08-25 MED ORDER — ONDANSETRON HCL 4 MG/2ML IJ SOLN
INTRAMUSCULAR | Status: AC
  Filled 2014-08-25: qty 2

## 2014-08-25 MED ORDER — ESMOLOL HCL 10 MG/ML IV SOLN
INTRAVENOUS | Status: AC
  Filled 2014-08-25: qty 10

## 2014-08-25 MED ORDER — VANCOMYCIN HCL IN DEXTROSE 1-5 GM/200ML-% IV SOLN
1000.0000 mg | INTRAVENOUS | Status: AC
  Administered 2014-08-26: 1000 mg via INTRAVENOUS
  Filled 2014-08-25: qty 200

## 2014-08-25 MED ORDER — PROPOFOL 10 MG/ML IV BOLUS
INTRAVENOUS | Status: DC | PRN
  Administered 2014-08-25: 80 mg via INTRAVENOUS
  Administered 2014-08-25: 120 mg via INTRAVENOUS

## 2014-08-25 MED ORDER — HEPARIN SODIUM (PORCINE) 1000 UNIT/ML IJ SOLN
INTRAMUSCULAR | Status: AC
  Filled 2014-08-25: qty 1

## 2014-08-25 MED ORDER — ACETAMINOPHEN 10 MG/ML IV SOLN
INTRAVENOUS | Status: DC | PRN
  Administered 2014-08-25: 1000 mg via INTRAVENOUS

## 2014-08-25 MED ORDER — OXYCODONE HCL 5 MG PO TABS
ORAL_TABLET | ORAL | Status: AC
  Administered 2014-08-25: 10 mg via ORAL
  Filled 2014-08-25: qty 2

## 2014-08-25 MED ORDER — CHLORHEXIDINE GLUCONATE 4 % EX LIQD: 60.0000 mL | Freq: Once | CUTANEOUS | Status: DC

## 2014-08-25 MED ORDER — LORATADINE 10 MG PO TABS
10.0000 mg | ORAL_TABLET | Freq: Every day | ORAL | Status: DC
  Administered 2014-08-25 – 2014-08-26 (×2): 10 mg via ORAL
  Filled 2014-08-25 (×2): qty 1

## 2014-08-25 MED ORDER — PROMETHAZINE HCL 25 MG/ML IJ SOLN: 6.2500 mg | INTRAMUSCULAR | Status: DC | PRN

## 2014-08-25 MED ORDER — SUCCINYLCHOLINE CHLORIDE 20 MG/ML IJ SOLN
INTRAMUSCULAR | Status: DC | PRN
  Administered 2014-08-25: 100 mg via INTRAVENOUS

## 2014-08-25 MED ORDER — HYDROMORPHONE HCL 1 MG/ML IJ SOLN
INTRAMUSCULAR | Status: AC
  Administered 2014-08-25: 0.5 mg via INTRAVENOUS
  Filled 2014-08-25: qty 1

## 2014-08-25 MED ORDER — PROPOFOL 10 MG/ML IV BOLUS
INTRAVENOUS | Status: AC
  Filled 2014-08-25: qty 20

## 2014-08-25 MED ORDER — HYDROMORPHONE HCL 1 MG/ML IJ SOLN
0.2500 mg | INTRAMUSCULAR | Status: DC | PRN
  Administered 2014-08-25 (×2): 0.5 mg via INTRAVENOUS

## 2014-08-25 MED ORDER — VANCOMYCIN HCL IN DEXTROSE 1-5 GM/200ML-% IV SOLN
1000.0000 mg | Freq: Two times a day (BID) | INTRAVENOUS | Status: DC
  Filled 2014-08-25: qty 200

## 2014-08-25 MED ORDER — BISACODYL 10 MG RE SUPP: 10.0000 mg | Freq: Every day | RECTAL | Status: DC | PRN

## 2014-08-25 MED ORDER — SENNOSIDES-DOCUSATE SODIUM 8.6-50 MG PO TABS
1.0000 | ORAL_TABLET | Freq: Every evening | ORAL | Status: DC | PRN
  Filled 2014-08-25: qty 1

## 2014-08-25 MED ORDER — HYDRALAZINE HCL 20 MG/ML IJ SOLN: 5.0000 mg | INTRAMUSCULAR | Status: DC | PRN

## 2014-08-25 MED ORDER — VITAMIN D3 25 MCG (1000 UT) PO CAPS: 1000.0000 [IU] | ORAL_CAPSULE | Freq: Every day | ORAL | Status: DC

## 2014-08-25 MED ORDER — ONDANSETRON HCL 4 MG/2ML IJ SOLN
4.0000 mg | Freq: Four times a day (QID) | INTRAMUSCULAR | Status: DC | PRN
  Administered 2014-08-25: 4 mg via INTRAVENOUS
  Filled 2014-08-25: qty 2

## 2014-08-25 MED ORDER — FENTANYL CITRATE (PF) 100 MCG/2ML IJ SOLN
INTRAMUSCULAR | Status: DC | PRN
  Administered 2014-08-25 (×2): 25 ug via INTRAVENOUS

## 2014-08-25 MED ORDER — OXYCODONE HCL 5 MG PO TABS
5.0000 mg | ORAL_TABLET | Freq: Four times a day (QID) | ORAL | Status: DC | PRN
  Administered 2014-08-25 (×2): 10 mg via ORAL
  Filled 2014-08-25 (×2): qty 1

## 2014-08-25 MED ORDER — OXYCODONE HCL 5 MG PO TABS: 5.0000 mg | ORAL_TABLET | Freq: Four times a day (QID) | ORAL | Status: DC | PRN

## 2014-08-25 MED ORDER — SODIUM CHLORIDE 0.9 % IV SOLN
INTRAVENOUS | Status: DC
  Administered 2014-08-25 – 2014-08-26 (×2): via INTRAVENOUS

## 2014-08-25 MED ORDER — ALUM & MAG HYDROXIDE-SIMETH 200-200-20 MG/5ML PO SUSP: 15.0000 mL | ORAL | Status: DC | PRN

## 2014-08-25 MED ORDER — SODIUM CHLORIDE 0.9 % IV SOLN
10.0000 mg | INTRAVENOUS | Status: DC | PRN
  Administered 2014-08-25: 20 ug/min via INTRAVENOUS

## 2014-08-25 MED ORDER — GLYCOPYRROLATE 0.2 MG/ML IJ SOLN
INTRAMUSCULAR | Status: DC | PRN
  Administered 2014-08-25: .4 mg via INTRAVENOUS

## 2014-08-25 MED ORDER — MIDAZOLAM HCL 2 MG/2ML IJ SOLN
INTRAMUSCULAR | Status: AC
  Filled 2014-08-25: qty 2

## 2014-08-25 MED ORDER — HEPARIN SODIUM (PORCINE) 5000 UNIT/ML IJ SOLN
INTRAMUSCULAR | Status: DC | PRN
  Administered 2014-08-25: 500 mL

## 2014-08-25 MED ORDER — VANCOMYCIN HCL IN DEXTROSE 1-5 GM/200ML-% IV SOLN
1000.0000 mg | Freq: Two times a day (BID) | INTRAVENOUS | Status: DC
  Filled 2014-08-25 (×2): qty 200

## 2014-08-25 MED ORDER — MAGNESIUM SULFATE 2 GM/50ML IV SOLN: 2.0000 g | Freq: Every day | INTRAVENOUS | Status: DC | PRN

## 2014-08-25 MED ORDER — SODIUM CHLORIDE 0.9 % IV SOLN: 500.0000 mL | Freq: Once | INTRAVENOUS | Status: AC | PRN

## 2014-08-25 MED ORDER — LIDOCAINE HCL (PF) 1 % IJ SOLN
INTRAMUSCULAR | Status: AC
  Filled 2014-08-25: qty 30

## 2014-08-25 MED ORDER — VITAMIN D3 25 MCG (1000 UNIT) PO TABS
1000.0000 [IU] | ORAL_TABLET | Freq: Every day | ORAL | Status: DC
  Administered 2014-08-25 – 2014-08-26 (×2): 1000 [IU] via ORAL
  Filled 2014-08-25 (×2): qty 1

## 2014-08-25 MED ORDER — PROTAMINE SULFATE 10 MG/ML IV SOLN
INTRAVENOUS | Status: AC
  Filled 2014-08-25: qty 5

## 2014-08-25 MED ORDER — LEVOTHYROXINE SODIUM 112 MCG PO TABS
112.0000 ug | ORAL_TABLET | Freq: Every day | ORAL | Status: DC
  Administered 2014-08-26: 112 ug via ORAL
  Filled 2014-08-25 (×2): qty 1

## 2014-08-25 MED ORDER — 0.9 % SODIUM CHLORIDE (POUR BTL) OPTIME
TOPICAL | Status: DC | PRN
  Administered 2014-08-25: 2000 mL

## 2014-08-25 MED ORDER — MEPERIDINE HCL 25 MG/ML IJ SOLN: 6.2500 mg | INTRAMUSCULAR | Status: DC | PRN

## 2014-08-25 MED ORDER — MIDAZOLAM HCL 5 MG/5ML IJ SOLN
INTRAMUSCULAR | Status: DC | PRN
  Administered 2014-08-25: 2 mg via INTRAVENOUS

## 2014-08-25 MED ORDER — FENTANYL CITRATE (PF) 250 MCG/5ML IJ SOLN
INTRAMUSCULAR | Status: AC
Start: 2014-08-25 — End: 2014-08-25
  Filled 2014-08-25: qty 5

## 2014-08-25 MED ORDER — SODIUM CHLORIDE 0.9 % IV SOLN
INTRAVENOUS | Status: DC
  Administered 2014-08-25: 10 mL/h via INTRAVENOUS
  Administered 2014-08-25 (×2): via INTRAVENOUS

## 2014-08-25 MED ORDER — ACETAMINOPHEN 10 MG/ML IV SOLN
INTRAVENOUS | Status: AC
  Filled 2014-08-25: qty 100

## 2014-08-25 MED ORDER — DOCUSATE SODIUM 100 MG PO CAPS
100.0000 mg | ORAL_CAPSULE | Freq: Every day | ORAL | Status: DC
  Administered 2014-08-26: 100 mg via ORAL
  Filled 2014-08-25: qty 1

## 2014-08-25 MED ORDER — ACETAMINOPHEN 500 MG PO TABS: 1000.0000 mg | ORAL_TABLET | Freq: Three times a day (TID) | ORAL | Status: DC | PRN

## 2014-08-25 MED ORDER — LIDOCAINE HCL 4 % MT SOLN
OROMUCOSAL | Status: DC | PRN
  Administered 2014-08-25: 5 mL via TOPICAL

## 2014-08-25 MED ORDER — REMIFENTANIL HCL 1 MG IV SOLR
INTRAVENOUS | Status: DC | PRN
  Administered 2014-08-25: .1 ug/kg/min via INTRAVENOUS

## 2014-08-25 MED ORDER — VANCOMYCIN HCL IN DEXTROSE 1-5 GM/200ML-% IV SOLN
1000.0000 mg | INTRAVENOUS | Status: AC
  Administered 2014-08-25: 1000 mg via INTRAVENOUS
  Filled 2014-08-25: qty 200

## 2014-08-25 MED ORDER — LABETALOL HCL 5 MG/ML IV SOLN
10.0000 mg | INTRAVENOUS | Status: DC | PRN
  Filled 2014-08-25: qty 4

## 2014-08-25 MED ORDER — SODIUM CHLORIDE 0.9 % IV SOLN: INTRAVENOUS | Status: DC

## 2014-08-25 MED ORDER — MORPHINE SULFATE 2 MG/ML IJ SOLN
2.0000 mg | INTRAMUSCULAR | Status: DC | PRN
  Administered 2014-08-25: 2 mg via INTRAVENOUS
  Filled 2014-08-25: qty 1

## 2014-08-25 MED ORDER — ASPIRIN EC 325 MG PO TBEC
325.0000 mg | DELAYED_RELEASE_TABLET | Freq: Every day | ORAL | Status: DC
  Administered 2014-08-26: 325 mg via ORAL
  Filled 2014-08-25: qty 1

## 2014-08-25 MED ORDER — PROTAMINE SULFATE 10 MG/ML IV SOLN
INTRAVENOUS | Status: DC | PRN
  Administered 2014-08-25 (×2): 20 mg via INTRAVENOUS
  Administered 2014-08-25: 10 mg via INTRAVENOUS
  Administered 2014-08-25 (×2): 20 mg via INTRAVENOUS

## 2014-08-25 MED ORDER — GUAIFENESIN-DM 100-10 MG/5ML PO SYRP: 15.0000 mL | ORAL_SOLUTION | ORAL | Status: DC | PRN

## 2014-08-25 MED ORDER — LISINOPRIL 10 MG PO TABS
10.0000 mg | ORAL_TABLET | Freq: Every day | ORAL | Status: DC
  Administered 2014-08-25 – 2014-08-26 (×2): 10 mg via ORAL
  Filled 2014-08-25 (×2): qty 1

## 2014-08-25 MED ORDER — PHENOL 1.4 % MT LIQD: 1.0000 | OROMUCOSAL | Status: DC | PRN

## 2014-08-25 MED ORDER — POTASSIUM CHLORIDE CRYS ER 20 MEQ PO TBCR: 20.0000 meq | EXTENDED_RELEASE_TABLET | Freq: Every day | ORAL | Status: DC | PRN

## 2014-08-25 MED ORDER — ENOXAPARIN SODIUM 30 MG/0.3ML ~~LOC~~ SOLN
30.0000 mg | SUBCUTANEOUS | Status: DC
  Filled 2014-08-25: qty 0.3

## 2014-08-25 MED ORDER — LIDOCAINE HCL (CARDIAC) 20 MG/ML IV SOLN
INTRAVENOUS | Status: DC | PRN
  Administered 2014-08-25: 100 mg via INTRAVENOUS

## 2014-08-25 MED ORDER — LIDOCAINE HCL (CARDIAC) 20 MG/ML IV SOLN
INTRAVENOUS | Status: AC
  Filled 2014-08-25: qty 5

## 2014-08-25 MED ORDER — PANTOPRAZOLE SODIUM 40 MG PO TBEC
40.0000 mg | DELAYED_RELEASE_TABLET | Freq: Every day | ORAL | Status: DC
Start: 2014-08-25 — End: 2014-08-26
  Administered 2014-08-25: 40 mg via ORAL
  Filled 2014-08-25 (×2): qty 1

## 2014-08-25 MED ORDER — METOPROLOL TARTRATE 1 MG/ML IV SOLN: 2.0000 mg | INTRAVENOUS | Status: DC | PRN

## 2014-08-25 MED ORDER — ONDANSETRON HCL 4 MG/2ML IJ SOLN
INTRAMUSCULAR | Status: DC | PRN
  Administered 2014-08-25: 4 mg via INTRAVENOUS

## 2014-08-25 MED ORDER — HEPARIN SODIUM (PORCINE) 1000 UNIT/ML IJ SOLN
INTRAMUSCULAR | Status: DC | PRN
Start: 2014-08-25 — End: 2014-08-25
  Administered 2014-08-25: 9 mL via INTRAVENOUS

## 2014-08-25 SURGICAL SUPPLY — 46 items
CANISTER SUCTION 2500CC (MISCELLANEOUS) ×2 IMPLANT
CANNULA VESSEL 3MM 2 BLNT TIP (CANNULA) ×2 IMPLANT
CATH ROBINSON RED A/P 18FR (CATHETERS) ×2 IMPLANT
CLIP TI MEDIUM 6 (CLIP) ×2 IMPLANT
CLIP TI WIDE RED SMALL 6 (CLIP) ×2 IMPLANT
CRADLE DONUT ADULT HEAD (MISCELLANEOUS) ×2 IMPLANT
DECANTER SPIKE VIAL GLASS SM (MISCELLANEOUS) IMPLANT
DRAIN HEMOVAC 1/8 X 5 (WOUND CARE) IMPLANT
ELECT REM PT RETURN 9FT ADLT (ELECTROSURGICAL) ×2
ELECTRODE REM PT RTRN 9FT ADLT (ELECTROSURGICAL) ×1 IMPLANT
EVACUATOR SILICONE 100CC (DRAIN) IMPLANT
GAUZE SPONGE 4X4 12PLY STRL (GAUZE/BANDAGES/DRESSINGS) ×2 IMPLANT
GEL ULTRASOUND 20GR AQUASONIC (MISCELLANEOUS) IMPLANT
GLOVE BIO SURGEON STRL SZ 6.5 (GLOVE) ×4 IMPLANT
GLOVE BIO SURGEON STRL SZ7.5 (GLOVE) ×2 IMPLANT
GLOVE BIOGEL PI IND STRL 6.5 (GLOVE) ×1 IMPLANT
GLOVE BIOGEL PI INDICATOR 6.5 (GLOVE) ×1
GLOVE SURG SS PI 6.5 STRL IVOR (GLOVE) ×2 IMPLANT
GOWN STRL REUS W/ TWL LRG LVL3 (GOWN DISPOSABLE) ×3 IMPLANT
GOWN STRL REUS W/TWL LRG LVL3 (GOWN DISPOSABLE) ×6
KIT BASIN OR (CUSTOM PROCEDURE TRAY) ×2 IMPLANT
KIT ROOM TURNOVER OR (KITS) ×2 IMPLANT
LIQUID BAND (GAUZE/BANDAGES/DRESSINGS) ×2 IMPLANT
LOOP VESSEL MINI RED (MISCELLANEOUS) IMPLANT
NDL HYPO 25GX1X1/2 BEV (NEEDLE) IMPLANT
NEEDLE HYPO 25GX1X1/2 BEV (NEEDLE) IMPLANT
NS IRRIG 1000ML POUR BTL (IV SOLUTION) ×4 IMPLANT
PACK CAROTID (CUSTOM PROCEDURE TRAY) ×2 IMPLANT
PAD ARMBOARD 7.5X6 YLW CONV (MISCELLANEOUS) ×4 IMPLANT
PATCH HEMASHIELD 8X75 (Vascular Products) ×2 IMPLANT
SHUNT CAROTID BYPASS 10 (VASCULAR PRODUCTS) ×2 IMPLANT
SHUNT CAROTID BYPASS 12FRX15.5 (VASCULAR PRODUCTS) IMPLANT
SPONGE INTESTINAL PEANUT (DISPOSABLE) ×2 IMPLANT
SPONGE SURGIFOAM ABS GEL 100 (HEMOSTASIS) IMPLANT
SUT ETHILON 3 0 PS 1 (SUTURE) IMPLANT
SUT PROLENE 6 0 CC (SUTURE) ×2 IMPLANT
SUT PROLENE 7 0 BV 1 (SUTURE) IMPLANT
SUT SILK 3 0 (SUTURE) ×2
SUT SILK 3 0 TIES 17X18 (SUTURE)
SUT SILK 3-0 18XBRD TIE 12 (SUTURE) IMPLANT
SUT SILK 3-0 18XBRD TIE BLK (SUTURE) IMPLANT
SUT VIC AB 3-0 SH 27 (SUTURE) ×2
SUT VIC AB 3-0 SH 27X BRD (SUTURE) ×1 IMPLANT
SUT VICRYL 4-0 PS2 18IN ABS (SUTURE) ×2 IMPLANT
SYR CONTROL 10ML LL (SYRINGE) IMPLANT
WATER STERILE IRR 1000ML POUR (IV SOLUTION) ×2 IMPLANT

## 2014-08-25 NOTE — Interval H&P Note (Signed)
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 After consideration of risks, benefits and other options for treatment, the patient has consented to  Procedure(s): ENDARTERECTOMY CAROTID-LEFT (Left) as a surgical intervention .  The patient's history has been reviewed, patient examined, no change in status, stable for surgery.  I have reviewed the patient's chart and labs.  Questions were answered to the patient's satisfaction.     Ruta Hinds

## 2014-08-25 NOTE — Telephone Encounter (Addendum)
-----   Message from Mena Goes, RN sent at 08/25/2014  3:30 PM EDT ----- Regarding: Schedule   ----- Message -----    From: Gabriel Earing, PA-C    Sent: 08/25/2014   3:15 PM      To: Vvs Charge Pool  S/p left CEA 08/25/14.  F/u with Dr. Oneida Alar in 2 weeks.  Thanks, Aldona Bar  Notified patient of post-op appt on 09-09-14 at 8:30 am

## 2014-08-25 NOTE — H&P (View-Only) (Signed)
[facility] [place] HISTORY AND PHYSICAL   History of Present Illness:  Patient is a 59 y.o. who presents for evaluation of left internal carotid artery stenosis. The patient does have a prior history of stroke in 2009. She had a left hemiplegia at that time. This completely resolved after rehabilitation. She denies any recent symptoms of TIA amaurosis or stroke. She states she has had some short-term memory loss over the last 6 months. She thinks this may be attributable to the fact that she has been unemployed and not had much stimulation. She does occasionally have some dizziness and tends to stumble to the left this is worse if she leans her head over. She has had a previous cervical fusion done through a lower midline neck incision. She is a former smoker but quit 20 years ago. She recently had a carotid duplex performed at Dr. Gwynn Burly office which suggested greater than 80% left internal carotid artery stenosis. She denies any history of chest pain or shortness of breath. Other medical problems include multiple herniated cervical and lumbar disks, hyperlipidemia, hypertension, obesity all of which are currently stable. She does take aspirin daily. She is also on a statin.  Past Medical History  Diagnosis Date  . Abdominal pain, epigastric 12/20/2006  . ALLERGIC RHINITIS 12/25/2006  . BACK PAIN 06/15/2008  . BRADYCARDIA 05/04/2008  . Carpal tunnel syndrome 10/16/2006  . Cellulitis and abscess of other specified site 06/15/2008  . CHEST PAIN 09/08/2009  . CIRRHOSIS 10/16/2006  . COMMON MIGRAINE 12/20/2006  . CONTUSION, LOWER LEG 09/15/2008  . Esophageal reflux 12/25/2006  . HYPERLIPIDEMIA 12/25/2006  . HYPERTENSION 08/15/2006  . HYPOTHYROIDISM 08/15/2006  . MIGRAINE HEADACHE 10/16/2006  . Morbid obesity 10/16/2006  . OVARIAN CYST 12/20/2006  . SHINGLES, HX OF 12/20/2006  . TRANSIENT ISCHEMIC ATTACKS, HX OF 12/20/2006  . VITAMIN B12 DEFICIENCY 01/09/2007  . VERTIGO 09/15/2007  .  Psoriasis 01/03/2011  . Allergy   . ANXIETY 10/16/2006    no per pt  . Blood transfusion without reported diagnosis   . DEPRESSION 10/16/2006    no per pt  . CKD (chronic kidney disease) stage 3, GFR 30-59 ml/min 10/16/2006    Qualifier: Diagnosis of  By: Jenny Reichmann MD, Hunt Oris   . CKD (chronic kidney disease)     2008- had Ecoli and caused renal failure, no problems since then  . Stroke     3 strokes 2008-no deficits, only on ASA    Past Surgical History  Procedure Laterality Date  . Abdominal hysterectomy  02/2006  . Cholecystectomy    . Neck surgery  2005  . Colonoscopy    . Cesarean section      Social History History  Substance Use Topics  . Smoking status: Former Research scientist (life sciences)  . Smokeless tobacco: Never Used  . Alcohol Use: No    Family History Family History  Problem Relation Age of Onset  . Diabetes Other   . Hypertension Other   . Colon cancer Neg Hx   . Esophageal cancer Neg Hx   . Rectal cancer Neg Hx   . Stomach cancer Neg Hx     Allergies  Allergies  Allergen Reactions  . Cefuroxime Axetil     REACTION: edema/swelling  . Ciprofloxacin Swelling  . Lipitor [Atorvastatin] Other (See Comments)    Myalgia   . Shellfish Allergy Swelling     Current Outpatient Prescriptions  Medication Sig Dispense Refill  . aspirin 325 MG EC tablet Take 325 mg  by mouth daily.      . Calcium Carbonate (CALCIUM 600 PO) Take by mouth daily.    . Cholecalciferol (VITAMIN D3) 1000 UNITS CAPS Take by mouth daily.    . fexofenadine (ALLEGRA) 180 MG tablet Take 180 mg by mouth daily as needed for allergies or rhinitis.    Marland Kitchen levothyroxine (SYNTHROID, LEVOTHROID) 112 MCG tablet Take 1 tablet (112 mcg total) by mouth daily. 90 tablet 3  . lisinopril (PRINIVIL,ZESTRIL) 10 MG tablet Take 1 tablet (10 mg total) by mouth daily. 90 tablet 3  . lovastatin (MEVACOR) 20 MG tablet Take 1 tablet (20 mg total) by mouth at bedtime. 90 tablet 3  . omeprazole (PRILOSEC) 20 MG capsule Take 1 capsule  (20 mg total) by mouth daily. 90 capsule 3  . folic acid (FOLVITE) 1 MG tablet Take 1 mg by mouth daily.    . methotrexate (RHEUMATREX) 2.5 MG tablet 3 tabs by mouth per wk (Patient not taking: Reported on 07/14/2014) 12 tablet 99   No current facility-administered medications for this visit.    ROS:   General:  No weight loss, Fever, chills  HEENT: No recent headaches, no nasal bleeding, no visual changes, no sore throat  Neurologic: + dizziness, no blackouts, no seizures. No recent symptoms of stroke or mini- stroke. No recent episodes of slurred speech, or temporary blindness.  Cardiac: No recent episodes of chest pain/pressure, no shortness of breath at rest.  No shortness of breath with exertion.  Denies history of atrial fibrillation or irregular heartbeat  Vascular: No history of rest pain in feet.  No history of claudication.  No history of non-healing ulcer, No history of DVT   Pulmonary: No home oxygen, no productive cough, no hemoptysis,  No asthma or wheezing  Musculoskeletal:  [x]  Arthritis, [x ] Low back pain,  [x ] Joint pain  Hematologic:No history of hypercoagulable state.  No history of easy bleeding.  No history of anemia  Gastrointestinal: No hematochezia or melena,  No gastroesophageal reflux, no trouble swallowing  Urinary: [x ] chronic Kidney disease, [ ]  on HD - [ ]  MWF or [ ]  TTHS, [ ]  Burning with urination, [ ]  Frequent urination, [ ]  Difficulty urinating;   Skin: No rashes  Psychological: No history of anxiety,  No history of depression   Physical Examination  Filed Vitals:   07/29/14 1117 07/29/14 1119  BP: 145/85 140/60  Pulse: 62 62  Resp: 16 18  Height: 5\' 2"  (1.575 m)   Weight: 89.812 kg (198 lb)     Body mass index is 36.21 kg/(m^2).  General:  Alert and oriented, no acute distress HEENT: Normal Neck: No bruit or JVD Pulmonary: Clear to auscultation bilaterally Cardiac: Regular Rate and Rhythm without murmur Abdomen: Soft,  non-tender, non-distended, no mass Skin: No rash Extremity Pulses:  2+ radial, brachial, femoral, dorsalis pedis, posterior tibial pulses bilaterally Musculoskeletal: No deformity or edema  Neurologic: Upper and lower extremity motor 5/5 and symmetric  DATA:  Reviewed the carotid duplex scan from Dr. Gwynn Burly office. This showed greater than 80% stenosis of the left internal carotid artery with less than 40% stenosis of the right internal carotid artery with normal subclavian arteries and antegrade flow in the vertebrals bilaterally. We repeated her left carotid duplex today for operative planning purposes. This confirms again greater than 80% stenosis on the left side there was normal artery distal to the stenosis and this did not appear to be a high lesion   ASSESSMENT:  Patient with greater than 80% stenosis left internal carotid artery. I do not believe her memory loss or dizziness are related to this. Memory loss symptoms are not related to carotid occlusive disease. Dizziness is a rare symptom of carotid occlusive disease and only appears usually with multivessel involvement which she does not have. In light of this I believe she would benefit from left carotid endarterectomy for stroke prophylaxis. Risks benefits possible complications and procedure details were explained to the patient today including not limited to bleeding infection cranial nerve injury stroke risk of 1-2% myocardial events. We will schedule her for a preoperative cardiac risk stratification and then proceed with carotid endarterectomy based on the cardiac findings. The patient will continue her aspirin. Her left carotid endarterectomy will be scheduled for early July.   PLAN:  See above  Ruta Hinds, MD [facility] [place] Office: [phone] Pager: [phone]

## 2014-08-25 NOTE — Anesthesia Procedure Notes (Signed)
Procedure Name: Intubation Date/Time: 08/25/2014 12:39 PM Performed by: Ollen Bowl Pre-anesthesia Checklist: Patient identified, Emergency Drugs available, Suction available, Patient being monitored and Timeout performed Patient Re-evaluated:Patient Re-evaluated prior to inductionOxygen Delivery Method: Circle system utilized and Simple face mask Preoxygenation: Pre-oxygenation with 100% oxygen Intubation Type: IV induction Ventilation: Mask ventilation without difficulty and Oral airway inserted - appropriate to patient size Laryngoscope Size: Mac and 3 Grade View: Grade I Tube type: Oral Tube size: 7.5 mm Number of attempts: 1 Airway Equipment and Method: Patient positioned with wedge pillow,  Stylet and LTA kit utilized Placement Confirmation: ETT inserted through vocal cords under direct vision,  positive ETCO2 and breath sounds checked- equal and bilateral Secured at: 22 cm Tube secured with: Tape Dental Injury: Teeth and Oropharynx as per pre-operative assessment

## 2014-08-25 NOTE — Progress Notes (Signed)
  Day of Surgery Note    Subjective:  Sleepy but awakes to voice and follows commands  Filed Vitals:   08/25/14 1530  BP:   Pulse: 93  Temp:   Resp: 22    Incisions:   C/d/i without hematoma Extremities:  Equal upper and lower extremities bilaterally Lungs:  Non labored Neuro:  In tact; tongue is midline  Assessment/Plan:  This is a 59 y.o. female who is s/p left carotid endarterctomy  -pt is neurologically intact. -transfer to [facility] when bed available -anticipate discharge tomorrow if she has a good night   Leontine Locket, PA-C 08/25/2014 3:39 PM

## 2014-08-25 NOTE — Op Note (Signed)
Procedure Left carotid endarterectomy  Preoperative diagnosis: High-grade asymptomatic left internal carotid artery stenosis  Postoperative diagnosis: Same  Anesthesia General  Asst.: Leontine Locket, PAC  Operative findings: #1 greater than 90% left internal carotid artery stenosis                                #2 Dacron patch extending 2 cm distally onto the internal carotid artery  Operative details: After obtaining informed consent, the patient was taken to the operating room. The patient was placed in a supine position on the operating room table. After induction of general anesthesia and endotracheal intubation a Foley catheter was placed. Next the patient's entire neck and chest was prepped and draped in the usual sterile fashion. An oblique incision was made on the left aspect of the patient's neck anterior to the border the leftt sternocleidomastoid muscle. The incision was carried into the subcutaneous tissues and through the platysma. The sternocleidomastoid muscle was identified and reflected laterally. The omohyoid muscle was identified and this was divided with cautery. The common facial vein was dissected free circumferentially and ligated and divided between silk ties.  The jugular vein was then reflected laterally.  The common carotid artery was then found at the base of the incision this was dissected free circumferentially. The vagus nerve was identified and protected. Dissection was then carried up to the level carotid bifurcation. The Ansa cervicalis was identified and traced up to its insertion into the hypoglossal nerve.  The hyperglossal nerve was draped over the internal carotid. Several tethering branches of the external carotid and venous branches were ligated and divided to mobolize the nerve.  The internal carotid artery was dissected free circumferentially just below the level of the hypoglossal nerve and it was soft in character at this location and above any palpable  disease. A vessel loop was placed around this. Next the external carotid and superior thyroid arteries were dissected free circumferentially and vessel loops were placed around these. The patient was given 9000 units of intravenous heparin. After 2 minutes of circulation time and raising the mean arterial pressure to 90 mm mercury, the distal internal carotid artery was controlled with small bulldog clamp. The external carotid and superior thyroid arteries were controlled with vessel loops. The common carotid artery was controlled with a Henley clamp. A longitudinal opening was made in the common carotid artery just below the bifurcation. The arteriotomy was extended distally up into the internal carotid with Potts scissors. There was a large calcified plaque with greater than 90% stenosis in the internal carotid origin extending about 2 cm into internal carotid.  The arteriotomy was extended past the level of stenosis.  A 10 Fr shunt was threaded into the distal internal carotid and allowed to backbleed after removing a portion of the distal plaque to make room for the shunt.  There was pulsatile backbleeding.  The shunt was then placed down into the common carotid and secured with a Rummel tourniquet.  The shunt was inspected and found to be free of air.  It was unclamped restoring flow to the brain.  Attention was then turned to the common carotid artery once again. A suitable endarterectomy plane was obtained and endarterectomy was begun in the common carotid artery and a reasonable proximal endpoint. An eversion endarterectomy was performed on the external carotid artery and a good endpoint was obtained. In the internal carotid artery a nice feathered distal endpoint had  already obtained.  The plaque was passed off the table. All loose debris was then removed from the carotid bed and everything was thoroughly irrigated with heparinized saline. A Dacron patch was then brought on to the operative field and this  was sewn on as a patch angioplasty using a running 6-0 Prolene suture. Prior to completion of the anastomosis the internal carotid artery was thoroughly backbled. This was then controlled again with a find bulldog clamp.  The common carotid was thoroughly flushed forward. The external carotid was also thoroughly backbled.  The remainder of the patch was completed and the anastomosis was secured. Flow was then restored first retrograde from the external carotid into the carotid bed then antegrade from the common carotid to the external carotid artery and after approximately 5 cardiac cycles to the internal carotid artery. Doppler was used to evaluate the external/internal and common carotid arteries and these all had good Doppler flow. Hemostasis was obtained with 90 mg of Protamine.     The platysma muscle was reapproximated using a running 3-0 Vicryl suture. The skin was closed with 4 0 Vicryl subcuticular stitch.  The patient was awakened in the operating room and was moving upper and lower extremities symmetrically and following commands.  The patient was stable on arrival to the PACU.  Ruta Hinds, MD [facility] [place] Office: [phone] Pager: [phone]

## 2014-08-25 NOTE — Anesthesia Postprocedure Evaluation (Signed)
Anesthesia Post Note  Patient: Heather Dawson  Procedure(s) Performed: Procedure(s) (LRB): LEFT CAROTID ENDARTERECTOMY WITH HEMASHIELD PATCH ANGIOPLASTY (Left)  Anesthesia type: general  Patient location: PACU  Post pain: Pain level controlled  Post assessment: Patient's Cardiovascular Status Stable  Last Vitals:  Filed Vitals:   08/25/14 1715  BP: 105/59  Pulse: 61  Temp:   Resp: 13    Post vital signs: Reviewed and stable  Level of consciousness: sedated  Complications: No apparent anesthesia complications

## 2014-08-25 NOTE — Anesthesia Preprocedure Evaluation (Addendum)
Anesthesia Evaluation  Patient identified by MRN, date of birth, ID band Patient awake    Reviewed: Allergy & Precautions, NPO status , Patient's Chart, lab work & pertinent test results  Airway Mallampati: I  TM Distance: >3 FB Neck ROM: Full    Dental  (+) Chipped, Dental Advisory Given,    Pulmonary neg pulmonary ROS, former smoker,  breath sounds clear to auscultation        Cardiovascular hypertension, + Peripheral Vascular Disease negative cardio ROS  + dysrhythmias Rhythm:Regular     Neuro/Psych negative neurological ROS  negative psych ROS   GI/Hepatic negative GI ROS, Neg liver ROS, GERD-  ,  Endo/Other  negative endocrine ROSdiabetesHypothyroidism   Renal/GU Renal disease  negative genitourinary   Musculoskeletal negative musculoskeletal ROS (+)   Abdominal   Peds negative pediatric ROS (+)  Hematology negative hematology ROS (+)   Anesthesia Other Findings   Reproductive/Obstetrics negative OB ROS                           Anesthesia Physical Anesthesia Plan  ASA: III  Anesthesia Plan: General   Post-op Pain Management:    Induction: Intravenous  Airway Management Planned: Oral ETT  Additional Equipment: Arterial line  Intra-op Plan:   Post-operative Plan: Extubation in OR  Informed Consent: I have reviewed the patients History and Physical, chart, labs and discussed the procedure including the risks, benefits and alternatives for the proposed anesthesia with the patient or authorized representative who has indicated his/her understanding and acceptance.   Dental advisory given  Plan Discussed with: CRNA  Anesthesia Plan Comments: (Remifentanil gtt)        Anesthesia Quick Evaluation

## 2014-08-25 NOTE — Transfer of Care (Signed)
Immediate Anesthesia Transfer of Care Note  Patient: Heather Dawson  Procedure(s) Performed: Procedure(s): LEFT CAROTID ENDARTERECTOMY WITH HEMASHIELD PATCH ANGIOPLASTY (Left)  Patient Location: PACU  Anesthesia Type:General  Level of Consciousness: awake, alert  and oriented  Airway & Oxygen Therapy: Patient Spontanous Breathing and Patient connected to nasal cannula oxygen  Post-op Assessment: Report given to RN, Post -op Vital signs reviewed and stable, Patient moving all extremities X 4 and Patient able to stick tongue midline  Post vital signs: Reviewed and stable  Last Vitals:  Filed Vitals:   08/25/14 1530  BP:   Pulse: 93  Temp:   Resp: 22    Complications: No apparent anesthesia complications

## 2014-08-25 NOTE — OR Nursing (Signed)
Patients Neuro Checks pre op are intact and equal

## 2014-08-26 ENCOUNTER — Encounter (HOSPITAL_COMMUNITY): Payer: Self-pay | Admitting: Vascular Surgery

## 2014-08-26 LAB — CBC
HCT: 35.6 % — ABNORMAL LOW (ref 36.0–46.0)
Hemoglobin: 11.6 g/dL — ABNORMAL LOW (ref 12.0–15.0)
MCH: 31.9 pg (ref 26.0–34.0)
MCHC: 32.6 g/dL (ref 30.0–36.0)
MCV: 97.8 fL (ref 78.0–100.0)
Platelets: 181 10*3/uL (ref 150–400)
RBC: 3.64 MIL/uL — ABNORMAL LOW (ref 3.87–5.11)
RDW: 12.6 % (ref 11.5–15.5)
WBC: 6.8 10*3/uL (ref 4.0–10.5)

## 2014-08-26 LAB — BASIC METABOLIC PANEL
Anion gap: 6 (ref 5–15)
BUN: 20 mg/dL (ref 6–20)
CO2: 22 mmol/L (ref 22–32)
Calcium: 8.2 mg/dL — ABNORMAL LOW (ref 8.9–10.3)
Chloride: 108 mmol/L (ref 101–111)
Creatinine, Ser: 1.78 mg/dL — ABNORMAL HIGH (ref 0.44–1.00)
GFR calc Af Amer: 35 mL/min — ABNORMAL LOW (ref >60)
GFR calc non Af Amer: 31 mL/min — ABNORMAL LOW (ref >60)
Glucose, Bld: 84 mg/dL (ref 65–99)
Potassium: 4.9 mmol/L (ref 3.5–5.1)
Sodium: 136 mmol/L (ref 135–145)

## 2014-08-26 NOTE — Progress Notes (Addendum)
  Progress Note    08/26/2014 7:41 AM 1 Day Post-Op  Subjective:  Pt wanted to get out of bed at 1am and was told she could get up in the morning.  Voiding okay, but on bedpan.  Only other complaint is sore throat, but no trouble swallowing  Afebrile HR 50's (one time at 7pm)  Otherwise, 60's-90's NSR [id number] systolic 0000000 RA  Filed Vitals:   08/26/14 0700  BP: 140/71  Pulse: 71  Temp:   Resp: 13     Physical Exam: Neuro:  In tact; tongue is midline; no trouble swallowing Incision:  Mild fullness around incision, but clean and dry  CBC    Component Value Date/Time   WBC 6.8 08/26/2014 0520   RBC 3.64* 08/26/2014 0520   HGB 11.6* 08/26/2014 0520   HCT 35.6* 08/26/2014 0520   PLT 181 08/26/2014 0520   MCV 97.8 08/26/2014 0520   MCH 31.9 08/26/2014 0520   MCHC 32.6 08/26/2014 0520   RDW 12.6 08/26/2014 0520   LYMPHSABS 2.0 04/28/2014 1443   MONOABS 0.5 04/28/2014 1443   EOSABS 0.5 04/28/2014 1443   BASOSABS 0.0 04/28/2014 1443    BMET    Component Value Date/Time   NA 136 08/26/2014 0520   K 4.9 08/26/2014 0520   CL 108 08/26/2014 0520   CO2 22 08/26/2014 0520   GLUCOSE 84 08/26/2014 0520   BUN 20 08/26/2014 0520   CREATININE 1.78* 08/26/2014 0520   CALCIUM 8.2* 08/26/2014 0520   GFRNONAA 31* 08/26/2014 0520   GFRAA 35* 08/26/2014 0520     Intake/Output Summary (Last 24 hours) at 08/26/14 0741 Last data filed at 08/26/14 [id number]  Gross per 24 hour  Intake 1966.25 ml  Output    625 ml  Net 1341.25 ml      Assessment/Plan:  This is a 59 y.o. female who is s/p left CEA 1 Day Post-Op  -pt is doing well this am. -pt neuro exam is in tact -pt has not ambulated -pt has voided on bed pan -pt needs to ambulate and get out of bed-will d/w nurse -sore throat, but swallowing okay -creatinine improved from last night 1.78 down from 1.92 at 8pm -discharge later this morning after she has walked and is eating ok.   Leontine Locket, PA-C [facility] [phone]   Agree with above.  No neck hematoma.  Neuro intact.  D/c home  Ruta Hinds, MD [facility] [place] Office: [phone] Pager: [phone]

## 2014-08-26 NOTE — Progress Notes (Signed)
UR COMPLETED  

## 2014-08-26 NOTE — Progress Notes (Signed)
Pt to d/c this am, VSS, reviewed d/c instructions w/ Pt & spouse; they indicated understanding.

## 2014-08-26 NOTE — Discharge Summary (Signed)
Discharge Summary     Heather Dawson 59 y.o. female  [id number]  Admission Date: 08/25/2014  Discharge Date: 08/26/14  Physician: Elam Dutch, MD  Admission Diagnosis: Left Internal Carotid Artery Stenosis I65.22   HPI:   This is a 59 y.o. female who presents for evaluation of left internal carotid artery stenosis. The patient does have a prior history of stroke in 2009. She had a left hemiplegia at that time. This completely resolved after rehabilitation. She denies any recent symptoms of TIA amaurosis or stroke. She states she has had some short-term memory loss over the last 6 months. She thinks this may be attributable to the fact that she has been unemployed and not had much stimulation. She does occasionally have some dizziness and tends to stumble to the left this is worse if she leans her head over. She has had a previous cervical fusion done through a lower midline neck incision. She is a former smoker but quit 20 years ago. She recently had a carotid duplex performed at Dr. Gwynn Burly office which suggested greater than 80% left internal carotid artery stenosis. She denies any history of chest pain or shortness of breath. Other medical problems include multiple herniated cervical and lumbar disks, hyperlipidemia, hypertension, obesity all of which are currently stable. She does take aspirin daily. She is also on a statin.  Hospital Course:  The patient was admitted to the hospital and taken to the operating room on 08/25/2014 and underwent left carotid endarterectomy.  The pt tolerated the procedure well and was transported to the PACU in good condition.   By POD 1, the pt neuro status was in tact.  She was not having any swallowing difficulty.  Her creatinine had improved from pre op.    The remainder of the hospital course consisted of increasing mobilization and increasing intake of solids without difficulty.    Recent Labs  08/26/14 0520  NA 136  K 4.9  CL  108  CO2 22  GLUCOSE 84  BUN 20  CALCIUM 8.2*    Recent Labs  08/25/14 2000 08/26/14 0520  WBC 7.9 6.8  HGB 11.8* 11.6*  HCT 35.8* 35.6*  PLT 165 181   No results for input(s): INR in the last 72 hours.   Discharge Instructions    CAROTID Sugery: Call MD for difficulty swallowing or speaking; weakness in arms or legs that is a new symtom; severe headache.  If you have increased swelling in the neck and/or  are having difficulty breathing, CALL 911    Complete by:  As directed      Call MD for:  redness, tenderness, or signs of infection (pain, swelling, bleeding, redness, odor or green/yellow discharge around incision site)    Complete by:  As directed      Call MD for:  severe or increased pain, loss or decreased feeling  in affected limb(s)    Complete by:  As directed      Call MD for:  temperature >100.5    Complete by:  As directed      Discharge wound care:    Complete by:  As directed   Shower daily with soap and water starting 08/27/14     Driving Restrictions    Complete by:  As directed   No driving for 2 weeks     Lifting restrictions    Complete by:  As directed   No lifting for 2 weeks     Resume previous diet  Complete by:  As directed            Discharge Diagnosis:  Left Internal Carotid Artery Stenosis I65.22  Secondary Diagnosis: Patient Active Problem List   Diagnosis Date Noted  . Left carotid artery stenosis 08/25/2014  . Left-sided carotid artery disease 08/10/2014  . Stenosis of right carotid artery 07/27/2014  . Chronic low back pain 07/14/2014  . 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 Dizziness and giddiness 11/06/2013  . GERD (gastroesophageal reflux disease) 11/06/2013  . Influenza 02/21/2013  . Paronychia 12/30/2012  . Encounter for long-term (current) use of high-risk medication 03/02/2011  . Psoriasis 01/03/2011  . Preventative health care 12/30/2010  . CHEST PAIN 09/08/2009  . LEG CRAMPS 06/20/2009  . HEMATOCHEZIA 03/21/2009   . GANGLION CYST, TENDON SHEATH 11/18/2008  . BACK PAIN 06/15/2008  . TRANSIENT ISCHEMIC ATTACK 10/14/2007  . ESOPHAGITIS 03/13/2007  . VITAMIN B12 DEFICIENCY 01/09/2007  . Diabetes 12/25/2006  . Hyperlipidemia 12/25/2006  . ALLERGIC RHINITIS 12/25/2006  . Esophageal reflux 12/25/2006  . COMMON MIGRAINE 12/20/2006  . OVARIAN CYST 12/20/2006  . Other malaise and fatigue 12/20/2006  . SHINGLES, HX OF 12/20/2006  . CHOLERA, D/T VIBRIO CHOLERAE EL TOR 10/16/2006  . Morbid obesity 10/16/2006  . ANXIETY 10/16/2006  . DEPRESSION 10/16/2006  . Carpal tunnel syndrome 10/16/2006  . CIRRHOSIS 10/16/2006  . CKD (chronic kidney disease) stage 3, GFR 30-59 ml/min 10/16/2006  . HYPOTHYROIDISM 08/15/2006  . Essential hypertension 08/15/2006   Past Medical History  Diagnosis Date  . Abdominal pain, epigastric 12/20/2006  . ALLERGIC RHINITIS 12/25/2006  . BACK PAIN 06/15/2008  . BRADYCARDIA 05/04/2008  . Carpal tunnel syndrome 10/16/2006  . Cellulitis and abscess of other specified site 06/15/2008  . CHEST PAIN 09/08/2009  . CIRRHOSIS 10/16/2006  . COMMON MIGRAINE 12/20/2006  . CONTUSION, LOWER LEG 09/15/2008  . Esophageal reflux 12/25/2006  . HYPERLIPIDEMIA 12/25/2006  . HYPERTENSION 08/15/2006  . HYPOTHYROIDISM 08/15/2006  . MIGRAINE HEADACHE 10/16/2006  . Morbid obesity 10/16/2006  . OVARIAN CYST 12/20/2006  . SHINGLES, HX OF 12/20/2006  . TRANSIENT ISCHEMIC ATTACKS, HX OF 12/20/2006  . VITAMIN B12 DEFICIENCY 01/09/2007  . VERTIGO 09/15/2007  . Psoriasis 01/03/2011  . Allergy   . ANXIETY 10/16/2006    no per pt  . Blood transfusion without reported diagnosis   . DEPRESSION 10/16/2006    no per pt  . CKD (chronic kidney disease) stage 3, GFR 30-59 ml/min 10/16/2006    Qualifier: Diagnosis of  By: Jenny Reichmann MD, Hunt Oris   . CKD (chronic kidney disease)     2008- had Ecoli and caused renal failure, no problems since then  . Stroke     3 strokes 2008-no deficits, only on ASA  . Left-sided carotid  artery disease   . Complication of anesthesia     awake during 2 surgeries  . Kidney failure, acute 2009    as a result of severe E Coli infection  . Chronic low back pain   . Arthritis     right shoulder      Medication List    TAKE these medications        acetaminophen 500 MG tablet  Commonly known as:  TYLENOL  Take 1,000 mg by mouth every 8 (eight) hours as needed (pain).     aspirin 325 MG EC tablet  Take 325 mg by mouth daily.     CALCIUM 600 PO  Take 1 tablet by mouth daily.     fexofenadine 180 MG  tablet  Commonly known as:  ALLEGRA  Take 180 mg by mouth daily as needed for allergies or rhinitis.     levothyroxine 112 MCG tablet  Commonly known as:  SYNTHROID, LEVOTHROID  Take 1 tablet (112 mcg total) by mouth daily.     lisinopril 10 MG tablet  Commonly known as:  PRINIVIL,ZESTRIL  Take 1 tablet (10 mg total) by mouth daily.     lovastatin 20 MG tablet  Commonly known as:  MEVACOR  Take 1 tablet (20 mg total) by mouth at bedtime.     omeprazole 20 MG capsule  Commonly known as:  PRILOSEC  Take 1 capsule (20 mg total) by mouth daily.     oxyCODONE 5 MG immediate release tablet  Commonly known as:  ROXICODONE  Take 1 tablet (5 mg total) by mouth every 6 (six) hours as needed.     sulfamethoxazole-trimethoprim 800-160 MG per tablet  Commonly known as:  BACTRIM DS,SEPTRA DS  Take 1 tablet by mouth 2 (two) times daily.     Vitamin D3 1000 UNITS Caps  Take 1,000 Units by mouth daily.        Prescriptions given: Roxicodone #20 No Refill  Instructions: 1.  No driving x 2 weeks 2.  No heavy lifting x 2 weeks  Disposition: home  Patient's condition: is Good  Follow up: 1. Dr. Oneida Alar in 2 weeks.   Leontine Locket, PA-C [facility] [phone]  --- For [facility] use --- Instructions: Press F2 to tab through selections.  Delete question if not applicable.   Modified Rankin score at D/C (0-6): 0  IV medication needed  for:  1. Hypertension: No 2. Hypotension: No  Post-op Complications: No  1. Post-op CVA or TIA: No  If yes: Event classification (right eye, left eye, right cortical, left cortical, verterobasilar, other): n/a  If yes: Timing of event (intra-op, <6 hrs post-op, >=6 hrs post-op, unknown): n/a  2. CN injury: No  If yes: CN n/a injuried   3. Myocardial infarction: No  If yes: Dx by (EKG or clinical, Troponin): n/a  4.  CHF: No  5.  Dysrhythmia (new): No  6. Wound infection: No  7. Reperfusion symptoms: No  8. Return to OR: No  If yes: return to OR for (bleeding, neurologic, other CEA incision, other): n/a  Discharge medications: Statin use:  Yes If No: [ ]  For Medical reasons, [ ]  Non-compliant, [ ]  Not-indicated ASA use:  Yes  If No: [ ]  For Medical reasons, [ ]  Non-compliant, [ ]  Not-indicated Beta blocker use:  No If No: [ ]  For Medical reasons, [ ]  Non-compliant, [ ]  Not-indicated ACE-Inhibitor use:  Yes If No: [ ]  For Medical reasons, [ ]  Non-compliant, [ ]  Not-indicated P2Y12 Antagonist use: No, [ ]  Plavix, [ ]  Plasugrel, [ ]  Ticlopinine, [ ]  Ticagrelor, [ ]  Other, [ ]  No for medical reason, [ ]  Non-compliant, [ ]  Not-indicated Anti-coagulant use:  No, [ ]  Warfarin, [ ]  Rivaroxaban, [ ]  Dabigatran, [ ]  Other, [ ]  No for medical reason, [ ]  Non-compliant, [ ]  Not-indicated

## 2014-09-06 ENCOUNTER — Encounter: Payer: Self-pay | Admitting: Vascular Surgery

## 2014-09-09 ENCOUNTER — Ambulatory Visit (INDEPENDENT_AMBULATORY_CARE_PROVIDER_SITE_OTHER): Payer: Self-pay | Admitting: Vascular Surgery

## 2014-09-09 ENCOUNTER — Encounter: Payer: Self-pay | Admitting: Vascular Surgery

## 2014-09-09 VITALS — BP 164/77 | HR 70 | Temp 98.5°F | Resp 16 | Ht 62.0 in | Wt 191.0 lb

## 2014-09-09 DIAGNOSIS — Z9889 Other specified postprocedural states: Secondary | ICD-10-CM

## 2014-09-09 DIAGNOSIS — I6522 Occlusion and stenosis of left carotid artery: Secondary | ICD-10-CM

## 2014-09-09 DIAGNOSIS — Z48812 Encounter for surgical aftercare following surgery on the circulatory system: Secondary | ICD-10-CM

## 2014-09-09 NOTE — Progress Notes (Signed)
[facility] [place] HISTORY AND PHYSICAL    History of Present Illness:  Patient is a 59 y.o. year old female who presents for post-operative follow-up after left carotid endarterectomy.  Denies headaches, numbness, tingling or other neuro deficits.  No swallowing problems.  No incisional drainage   Physical Examination  Filed Vitals:   09/09/14 0840  BP: 164/77  Pulse: 70  Temp: 98.5 F (36.9 C)  Resp: 16    Body mass index is 34.93 kg/(m^2).  General:  Alert and oriented, no acute distress Neck: No bruit or JVD healing left neck incision some peri-incisional numbness underneath the left side of her mandible Skin: No rash Neurologic: Upper and lower extremity motor 5/5 and symmetric no facial asymmetry tongue midline  ASSESSMENT: Doing well status post left carotid endarterectomy   PLAN: Follow-up 6 months carotid duplex scan. Continue aspirin daily   Ruta Hinds, MD [facility] [place] Office: [phone] Pager: [phone]

## 2014-10-04 ENCOUNTER — Telehealth: Payer: Self-pay | Admitting: *Deleted

## 2014-10-04 NOTE — Telephone Encounter (Signed)
Patient called triage today to report an area at the bottom of her incision,(Left CEA on 08-25-14 by Dr. Oneida Alar) that opened up 1 day ago. It has been draining since Saturday night, and is red and sore today. She is afebrile. Patient instructed to keep area clean and dry until seen in the office by our PA tomorrow (Pt could not come in today). She voiced understanding and agreement of this plan.

## 2014-10-05 ENCOUNTER — Encounter: Payer: Self-pay | Admitting: Vascular Surgery

## 2014-10-05 ENCOUNTER — Ambulatory Visit (INDEPENDENT_AMBULATORY_CARE_PROVIDER_SITE_OTHER): Payer: Self-pay | Admitting: Vascular Surgery

## 2014-10-05 VITALS — BP 121/83 | HR 77 | Temp 98.2°F | Ht 62.0 in | Wt 188.5 lb

## 2014-10-05 DIAGNOSIS — I6522 Occlusion and stenosis of left carotid artery: Secondary | ICD-10-CM

## 2014-10-05 NOTE — Progress Notes (Signed)
Subjective:     Patient ID: Heather Dawson, female   DOB: 03-Nov-1955, 59 y.o.   MRN: [id number]  HPI this 59 year old female had left carotid endarterectomy performed by Dr. Juanda Crumble fields about 5 weeks ago. She has noticed some mild tenderness and redness at the very lower part of her neck incision. She denies any episodes of lateralizing weakness, aphasia, amaurosis fugax, diplopia, blurred vision, or syncope. She's had no chills and fever. Review of Systems     Objective:   Physical Exam BP 121/83 mmHg  Pulse 77  Temp(Src) 98.2 F (36.8 C) (Oral)  Ht 5\' 2"  (1.575 m)  Wt 188 lb 8 oz (85.503 kg)  BMI 34.47 kg/m2  SpO2 96%  Gen. well-developed well-nourished female in no apparent distress alert and oriented 3 Left neck incision has minimal erythema at the lower aspect of the incision. Incision is otherwise well-healed. A suture knot was excised from the superior aspect of the incision. There was no suture protruding through the inferior margin of the incision. No fluctuance or drainage. 3+ carotid pulses with audible bruits. Neurologic exam normal     Assessment:     Very superficial inflammation at suture knot inferiorly-no purulence    Plan:     #1 moist compress to this area twice daily Keflex 500 mg 1 3 times a day 1 week Keep an appointment with Dr. Oneida Alar in 6 months If developed chills fever or increasing redness or any drainage please contact us for further follow-up sooner

## 2014-10-12 DIAGNOSIS — Z0279 Encounter for issue of other medical certificate: Secondary | ICD-10-CM | POA: Diagnosis not present

## 2014-11-10 ENCOUNTER — Other Ambulatory Visit (INDEPENDENT_AMBULATORY_CARE_PROVIDER_SITE_OTHER): Payer: BLUE CROSS/BLUE SHIELD

## 2014-11-10 ENCOUNTER — Encounter: Payer: Self-pay | Admitting: Internal Medicine

## 2014-11-10 ENCOUNTER — Ambulatory Visit (INDEPENDENT_AMBULATORY_CARE_PROVIDER_SITE_OTHER): Payer: BLUE CROSS/BLUE SHIELD | Admitting: Internal Medicine

## 2014-11-10 VITALS — BP 132/84 | HR 58 | Temp 97.7°F | Ht 62.0 in | Wt 183.0 lb

## 2014-11-10 DIAGNOSIS — E119 Type 2 diabetes mellitus without complications: Secondary | ICD-10-CM

## 2014-11-10 DIAGNOSIS — N183 Chronic kidney disease, stage 3 unspecified: Secondary | ICD-10-CM

## 2014-11-10 DIAGNOSIS — E785 Hyperlipidemia, unspecified: Secondary | ICD-10-CM

## 2014-11-10 DIAGNOSIS — Z23 Encounter for immunization: Secondary | ICD-10-CM

## 2014-11-10 DIAGNOSIS — I10 Essential (primary) hypertension: Secondary | ICD-10-CM

## 2014-11-10 DIAGNOSIS — Z0189 Encounter for other specified special examinations: Secondary | ICD-10-CM

## 2014-11-10 DIAGNOSIS — Z0000 Encounter for general adult medical examination without abnormal findings: Secondary | ICD-10-CM

## 2014-11-10 LAB — CBC WITH DIFFERENTIAL/PLATELET
Basophils Absolute: 0 10*3/uL (ref 0.0–0.1)
Basophils Relative: 0.2 % (ref 0.0–3.0)
Eosinophils Absolute: 0.3 10*3/uL (ref 0.0–0.7)
Eosinophils Relative: 4.4 % (ref 0.0–5.0)
HCT: 42 % (ref 36.0–46.0)
Hemoglobin: 14 g/dL (ref 12.0–15.0)
Lymphocytes Relative: 21.6 % (ref 12.0–46.0)
Lymphs Abs: 1.7 10*3/uL (ref 0.7–4.0)
MCHC: 33.3 g/dL (ref 30.0–36.0)
MCV: 94.5 fl (ref 78.0–100.0)
Monocytes Absolute: 0.5 10*3/uL (ref 0.1–1.0)
Monocytes Relative: 6.5 % (ref 3.0–12.0)
Neutro Abs: 5.3 10*3/uL (ref 1.4–7.7)
Neutrophils Relative %: 67.3 % (ref 43.0–77.0)
Platelets: 219 10*3/uL (ref 150.0–400.0)
RBC: 4.44 Mil/uL (ref 3.87–5.11)
RDW: 12.2 % (ref 11.5–15.5)
WBC: 7.9 10*3/uL (ref 4.0–10.5)

## 2014-11-10 LAB — HEPATIC FUNCTION PANEL
ALT: 15 U/L (ref 0–35)
AST: 12 U/L (ref 0–37)
Albumin: 4.3 g/dL (ref 3.5–5.2)
Alkaline Phosphatase: 70 U/L (ref 39–117)
Bilirubin, Direct: 0.1 mg/dL (ref 0.0–0.3)
Total Bilirubin: 0.7 mg/dL (ref 0.2–1.2)
Total Protein: 7.1 g/dL (ref 6.0–8.3)

## 2014-11-10 LAB — BASIC METABOLIC PANEL
BUN: 35 mg/dL — ABNORMAL HIGH (ref 6–23)
CO2: 30 mEq/L (ref 19–32)
Calcium: 9.6 mg/dL (ref 8.4–10.5)
Chloride: 105 mEq/L (ref 96–112)
Creatinine, Ser: 1.47 mg/dL — ABNORMAL HIGH (ref 0.40–1.20)
GFR: 38.81 mL/min — ABNORMAL LOW (ref >60.00)
Glucose, Bld: 101 mg/dL — ABNORMAL HIGH (ref 70–99)
Potassium: 4.5 mEq/L (ref 3.5–5.1)
Sodium: 139 mEq/L (ref 135–145)

## 2014-11-10 LAB — LIPID PANEL
Cholesterol: 197 mg/dL (ref 0–200)
HDL: 43.3 mg/dL (ref >39.00)
LDL Cholesterol: 127 mg/dL — ABNORMAL HIGH (ref 0–99)
NonHDL: 153.97
Total CHOL/HDL Ratio: 5
Triglycerides: 135 mg/dL (ref 0.0–149.0)
VLDL: 27 mg/dL (ref 0.0–40.0)

## 2014-11-10 LAB — HEMOGLOBIN A1C: Hgb A1c MFr Bld: 5.8 % (ref 4.6–6.5)

## 2014-11-10 NOTE — Assessment & Plan Note (Signed)
Mild worse periop recently, cont f/u renal, also check today, volume stable

## 2014-11-10 NOTE — Progress Notes (Signed)
Subjective:    Patient ID: Heather Dawson, female    DOB: Jun 29, 1955, 59 y.o.   MRN: [id number]  HPI  Here to f/u; overall doing ok,  Pt denies chest pain, increasing sob or doe, wheezing, orthopnea, PND, increased LE swelling, palpitations, dizziness or syncope.  Pt denies new neurological symptoms such as new headache, or facial or extremity weakness or numbness.  Pt denies polydipsia, polyuria, or low sugar episode.   Pt denies new neurological symptoms such as new headache, or facial or extremity weakness or numbness.   Pt states overall good compliance with meds, mostly trying to follow appropriate diet, with wt overall stable,  but little exercise however. Sees Dr Rolena Infante for right LBP/hip pain, s/p ESI last wk. Shot only lasted 5 days, now pain coming back a few days ago. Pt is deferring recommended surgury for now.  Still sees renal/Dr Colodonato , has lost wt , blood sugars much improved, though not able to exercise as much as she wants, mostly done with better diet.  Past Medical History  Diagnosis Date  . Abdominal pain, epigastric 12/20/2006  . ALLERGIC RHINITIS 12/25/2006  . BACK PAIN 06/15/2008  . BRADYCARDIA 05/04/2008  . Carpal tunnel syndrome 10/16/2006  . Cellulitis and abscess of other specified site 06/15/2008  . CHEST PAIN 09/08/2009  . CIRRHOSIS 10/16/2006  . COMMON MIGRAINE 12/20/2006  . CONTUSION, LOWER LEG 09/15/2008  . Esophageal reflux 12/25/2006  . HYPERLIPIDEMIA 12/25/2006  . HYPERTENSION 08/15/2006  . HYPOTHYROIDISM 08/15/2006  . MIGRAINE HEADACHE 10/16/2006  . Morbid obesity 10/16/2006  . OVARIAN CYST 12/20/2006  . SHINGLES, HX OF 12/20/2006  . TRANSIENT ISCHEMIC ATTACKS, HX OF 12/20/2006  . VITAMIN B12 DEFICIENCY 01/09/2007  . VERTIGO 09/15/2007  . Psoriasis 01/03/2011  . Allergy   . ANXIETY 10/16/2006    no per pt  . Blood transfusion without reported diagnosis   . DEPRESSION 10/16/2006    no per pt  . CKD (chronic kidney disease) stage 3, GFR 30-59 ml/min 10/16/2006      Qualifier: Diagnosis of  By: Jenny Reichmann MD, Hunt Oris   . CKD (chronic kidney disease)     2008- had Ecoli and caused renal failure, no problems since then  . Stroke     3 strokes 2008-no deficits, only on ASA  . Left-sided carotid artery disease   . Complication of anesthesia     awake during 2 surgeries  . Kidney failure, acute 2009    as a result of severe E Coli infection  . Chronic low back pain   . Arthritis     right shoulder   Past Surgical History  Procedure Laterality Date  . Abdominal hysterectomy  02/2006  . Cholecystectomy    . Neck surgery  2005  . Colonoscopy    . Cesarean section    . Anterior cervical decomp/discectomy fusion  2005  . Rotator cuff repair Right 02/2014    Dr Veverly Fells  . Endarterectomy Left 08/25/2014    Procedure: LEFT CAROTID ENDARTERECTOMY WITH HEMASHIELD PATCH ANGIOPLASTY;  Surgeon: Elam Dutch, MD;  Location: [facility];  Service: Vascular;  Laterality: Left;    reports that she has quit smoking. She has never used smokeless tobacco. She reports that she does not drink alcohol or use illicit drugs. family history includes Diabetes in her father, other, and sister; Heart disease in her brother, brother, father, and mother; Hypertension in her brother, brother, and other; Lupus in her mother; Raynaud syndrome in her mother; Stroke  in her father. There is no history of Colon cancer, Esophageal cancer, Rectal cancer, or Stomach cancer. Allergies  Allergen Reactions  . Cefuroxime Axetil     REACTION: edema/swelling  . Ciprofloxacin Swelling  . Lipitor [Atorvastatin] Other (See Comments)    Myalgia    Current Outpatient Prescriptions on File Prior to Visit  Medication Sig Dispense Refill  . acetaminophen (TYLENOL) 500 MG tablet Take 1,000 mg by mouth every 8 (eight) hours as needed (pain).    Marland Kitchen aspirin 325 MG EC tablet Take 325 mg by mouth daily.      . Calcium Carbonate (CALCIUM 600 PO) Take 1 tablet by mouth daily.     . Cholecalciferol (VITAMIN D3)  1000 UNITS CAPS Take 2,000 Units by mouth daily.     . fexofenadine (ALLEGRA) 180 MG tablet Take 180 mg by mouth daily as needed for allergies or rhinitis.    Marland Kitchen levothyroxine (SYNTHROID, LEVOTHROID) 112 MCG tablet Take 1 tablet (112 mcg total) by mouth daily. 90 tablet 3  . lisinopril (PRINIVIL,ZESTRIL) 10 MG tablet Take 1 tablet (10 mg total) by mouth daily. 90 tablet 3  . lovastatin (MEVACOR) 20 MG tablet Take 1 tablet (20 mg total) by mouth at bedtime. 90 tablet 3  . omeprazole (PRILOSEC) 20 MG capsule Take 1 capsule (20 mg total) by mouth daily. 90 capsule 3   No current facility-administered medications on file prior to visit.     Review of Systems  Constitutional: Negative for unusual diaphoresis or night sweats HENT: Negative for ringing in ear or discharge Eyes: Negative for double vision or worsening visual disturbance.  Respiratory: Negative for choking and stridor.   Gastrointestinal: Negative for vomiting or other signifcant bowel change Genitourinary: Negative for hematuria or change in urine volume.  Musculoskeletal: Negative for other MSK pain or swelling Skin: Negative for color change and worsening wound.  Neurological: Negative for tremors and numbness other than noted  Psychiatric/Behavioral: Negative for decreased concentration or agitation other than above       Objective:   Physical Exam BP 174/98 mmHg  Pulse 58  Temp(Src) 97.7 F (36.5 C) (Oral)  Ht 5\' 2"  (1.575 m)  Wt 183 lb (83.008 kg)  BMI 33.46 kg/m2  SpO2 98%  Blood pressure repeat after sitting; 132/84 VS noted,  Constitutional: Pt appears in no significant distress HENT: Head: NCAT.  Right Ear: External ear normal.  Left Ear: External ear normal.  Eyes: . Pupils are equal, round, and reactive to light. Conjunctivae and EOM are normal Neck: Normal range of motion. Neck supple.  Cardiovascular: Normal rate and regular rhythm.   Pulmonary/Chest: Effort normal and breath sounds without rales or  wheezing.  Abd:  Soft, NT, ND, + BS Neurological: Pt is alert. Not confused , motor grossly intact Skin: Skin is warm. No rash, no LE edema Psychiatric: Pt behavior is normal. No agitation.      Assessment & Plan:

## 2014-11-10 NOTE — Assessment & Plan Note (Signed)
stable overall by history and exam, recent data reviewed with pt, and pt to continue medical treatment as before,  to f/u any worsening symptoms or concerns Lab Results  Component Value Date   HGBA1C 7.3* 04/28/2014

## 2014-11-10 NOTE — Progress Notes (Signed)
Pre visit review using our clinic review tool, if applicable. No additional management support is needed unless otherwise documented below in the visit note. 

## 2014-11-10 NOTE — Assessment & Plan Note (Signed)
stable overall by history and exam, recent data reviewed with pt, and pt to continue medical treatment as before,  to f/u any worsening symptoms or concerns Lab Results  Component Value Date   LDLCALC 156* 07/14/2013

## 2014-11-10 NOTE — Patient Instructions (Addendum)
Your blood pressure is repeated today: 132/84  Please continue all other medications as before, and refills have been done if requested.  Please have the pharmacy call with any other refills you may need.  Please continue your efforts at being more active, low cholesterol diet, and weight control.  You are otherwise up to date with prevention measures today.  Please keep your appointments with your specialists as you may have planned  Please go to the LAB in the Basement (turn left off the elevator) for the tests to be done today  You will be contacted by phone if any changes need to be made immediately.  Otherwise, you will receive a letter about your results with an explanation, but please check with MyChart first.  Please remember to sign up for MyChart if you have not done so, as this will be important to you in the future with finding out test results, communicating by private email, and scheduling acute appointments online when needed.  Please return in 6 months, or sooner if needed, with Lab testing done 3-5 days before

## 2014-11-12 ENCOUNTER — Telehealth: Payer: Self-pay | Admitting: Internal Medicine

## 2014-11-12 NOTE — Telephone Encounter (Signed)
Rec'd records from [facility]Forwarding 3 pages's to Dr.John

## 2014-11-17 ENCOUNTER — Encounter: Payer: Self-pay | Admitting: Internal Medicine

## 2014-11-25 ENCOUNTER — Telehealth: Payer: Self-pay

## 2014-11-25 NOTE — Telephone Encounter (Signed)
Pt. called to request clearance from Dr. Oneida Alar to proceed with an Orthopaedic procedure; is scheduled for "Fusion of SI Join (R) Hip/ Pelvis."  Discussed pt's vascular history (s/p Left CEA 08/25/14) with Dr. Oneida Alar; gave the approval for pt. to have Orthopaedic procedure.  Pt. notified. Verb. Understanding.

## 2014-11-30 ENCOUNTER — Telehealth: Payer: Self-pay

## 2014-11-30 NOTE — Telephone Encounter (Signed)
Pt. Notified, per phone, of approval by Dr. Oneida Alar, to hold the ASA, 5 days prior to scheduled Orthopedic procedure of 12/10/14.  Verb. understanding.

## 2014-11-30 NOTE — Telephone Encounter (Signed)
-----   Message from Elam Dutch, MD sent at 11/30/2014 12:29 PM EDT ----- Regarding: RE: Question about ASA  yes ----- Message -----    From: Denman George, RN    Sent: 11/30/2014  10:38 AM      To: Elam Dutch, MD Subject: Question about ASA                             Pt. called to ask if she can hold her ASA 5 days prior to (R) SI Joint Fusion 12/10/14.  S/p Left CEA 08/25/14; hx of stroke 2009.  Please advise.

## 2015-02-04 ENCOUNTER — Encounter: Payer: Self-pay | Admitting: Cardiovascular Disease

## 2015-02-04 ENCOUNTER — Ambulatory Visit (INDEPENDENT_AMBULATORY_CARE_PROVIDER_SITE_OTHER): Payer: BLUE CROSS/BLUE SHIELD | Admitting: Cardiovascular Disease

## 2015-02-04 VITALS — BP 164/80 | HR 64 | Ht 62.0 in | Wt 173.2 lb

## 2015-02-04 DIAGNOSIS — I10 Essential (primary) hypertension: Secondary | ICD-10-CM

## 2015-02-04 DIAGNOSIS — I779 Disorder of arteries and arterioles, unspecified: Secondary | ICD-10-CM

## 2015-02-04 DIAGNOSIS — I739 Peripheral vascular disease, unspecified: Principal | ICD-10-CM

## 2015-02-04 DIAGNOSIS — E785 Hyperlipidemia, unspecified: Secondary | ICD-10-CM | POA: Diagnosis not present

## 2015-02-04 NOTE — Assessment & Plan Note (Signed)
History of hyperlipidemia on lovastatin with recent lipid profile performed 11/10/14 revealed a total cholesterol 97, LDL 127 and HDL 43. She is still not at goal for secondary prevention. She has lost 45 pounds as a result diet and exercise. I will recheck a lipid liver profile after the first the year. She is somewhat statin intolerance and may benefit from addition of PCSK9 monoclonal injectables.

## 2015-02-04 NOTE — Assessment & Plan Note (Signed)
History of elective left carotid endarterectomy performed by Dr. Oneida Alar which he follows as an outpatient.

## 2015-02-04 NOTE — Assessment & Plan Note (Signed)
History of hypertension blood pressure measured at 164/80. It is usually in the 130/80 range. She is on lisinopril. Continue current meds at current dosing

## 2015-02-04 NOTE — Patient Instructions (Addendum)
Medication Instructions:  Your physician recommends that you continue on your current medications as directed. Please refer to the Current Medication list given to you today.  Labwork: NONE  Testing/Procedures: Your physician recommends that you return for lab work in: January for Lipid and liver panel.   Follow-up: We request that you follow-up in: 1 year with Dr Gwenlyn Found.  You will receive a reminder letter in the mail two months in advance. If you don't receive a letter, please call our office to schedule the follow-up appointment.    Any Other Special Instructions Will Be Listed Below (If Applicable).     If you need a refill on your cardiac medications before your next appointment, please call your pharmacy.

## 2015-02-04 NOTE — Progress Notes (Signed)
02/04/2015 Michae Kava Kulaga   1955/08/31  [id number]  Primary Physician [name], MD Primary Cardiologist: Lorretta Harp MD Renae Gloss   HPI:  [name] is a very pleasant 59 year old mildly overweight married Caucasian female mother of 2 children, grandmother and 4 grandchildren referred by Dr. Oneida Alar for cardiovascular clearance prior to elective left carotid endarterectomy. Her primary care physician is Dr. [name]. I last saw her in the office is 08/10/14 Her cardiac risk factor profile is notable for treated hypertension and hyperlipidemia. Both her parents had myocardial infarctions and are deceased. Her brother had a myocardial infarction as well. 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 She has never had a heart attack. She denies chest pain or shortness of breath. She had carotid Dopplers performed recently that showed high-grade asymptomatic left internal carotid artery stenosis and was referred to Dr. Oneida Alar. She had a Myoview stress test performed 08/12/14 which was low risk with normal ejection fraction. She separately underwent uncomplicated elective left carotid endarterectomy. She is otherwise asymptomatic. She has lost 45 pounds over the last several months as a result of diet and exercise.   Current Outpatient Prescriptions  Medication Sig Dispense Refill  . acetaminophen (TYLENOL) 500 MG tablet Take 1,000 mg by mouth every 8 (eight) hours as needed (pain).    Marland Kitchen aspirin 325 MG EC tablet Take 325 mg by mouth daily.      . Calcium Carbonate (CALCIUM 600 PO) Take 1 tablet by mouth daily.     . Cholecalciferol (VITAMIN D3) 1000 UNITS CAPS Take 2,000 Units by mouth daily.     . fexofenadine (ALLEGRA) 180 MG tablet Take 180 mg by mouth every other day.     . levothyroxine (SYNTHROID, LEVOTHROID) 112 MCG tablet Take 1 tablet (112 mcg total) by mouth daily. 90 tablet 3  . lisinopril (PRINIVIL,ZESTRIL) 10 MG tablet  Take 1 tablet (10 mg total) by mouth daily. 90 tablet 3  . lovastatin (MEVACOR) 20 MG tablet Take 1 tablet (20 mg total) by mouth at bedtime. 90 tablet 3  . omeprazole (PRILOSEC) 20 MG capsule Take 1 capsule (20 mg total) by mouth daily. 90 capsule 3   No current facility-administered medications for this visit.    Allergies  Allergen Reactions  . Cefuroxime Axetil     REACTION: edema/swelling  . Ciprofloxacin Swelling  . Lipitor [Atorvastatin] Other (See Comments)    Myalgia     Social History   Social History  . Marital Status: Married    Spouse Name: N/A  . Number of Children: N/A  . Years of Education: N/A   Occupational History  . Not on file.   Social History Main Topics  . Smoking status: Former Research scientist (life sciences)  . Smokeless tobacco: Never Used     Comment: quit smoking over 30 years ago  . Alcohol Use: No  . Drug Use: No  . Sexual Activity: Not on file   Other Topics Concern  . Not on file   Social History Narrative     Review of Systems: General: negative for chills, fever, night sweats or weight changes.  Cardiovascular: negative for chest pain, dyspnea on exertion, edema, orthopnea, palpitations, paroxysmal nocturnal dyspnea or shortness of breath Dermatological: negative for rash Respiratory: negative for cough or wheezing Urologic: negative for hematuria Abdominal: negative for nausea, vomiting, diarrhea, bright red blood per rectum, melena, or hematemesis Neurologic: negative for visual changes, syncope, or dizziness All other  systems reviewed and are otherwise negative except as noted above.    Blood pressure 164/80, pulse 64, height 5\' 2"  (1.575 m), weight 173 lb 3.2 oz (78.563 kg).  General appearance: alert and no distress Neck: no adenopathy, no carotid bruit, no JVD, supple, symmetrical, trachea midline and thyroid not enlarged, symmetric, no tenderness/mass/nodules Lungs: clear to auscultation bilaterally Heart: regular rate and rhythm, S1, S2  normal, no murmur, click, rub or gallop Extremities: extremities normal, atraumatic, no cyanosis or edema  EKG normal sinus rhythm at 64 without ST or T-wave changes. I personally reviewed this EKG  ASSESSMENT AND PLAN:   Left-sided carotid artery disease History of elective left carotid endarterectomy performed by Dr. Oneida Alar which he follows as an outpatient.  Hyperlipidemia History of hyperlipidemia on lovastatin with recent lipid profile performed 11/10/14 revealed a total cholesterol 97, LDL 127 and HDL 43. She is still not at goal for secondary prevention. She has lost 45 pounds as a result diet and exercise. I will recheck a lipid liver profile after the first the year. She is somewhat statin intolerance and may benefit from addition of PCSK9 monoclonal injectables.  Essential hypertension History of hypertension blood pressure measured at 164/80. It is usually in the 130/80 range. She is on lisinopril. Continue current meds at current dosing      Lorretta Harp MD [facility], [facility] 02/04/2015 10:22 AM

## 2015-03-17 ENCOUNTER — Encounter (HOSPITAL_COMMUNITY): Payer: BLUE CROSS/BLUE SHIELD

## 2015-03-17 ENCOUNTER — Ambulatory Visit: Payer: BLUE CROSS/BLUE SHIELD | Admitting: Vascular Surgery

## 2015-04-06 ENCOUNTER — Encounter: Payer: Self-pay | Admitting: Internal Medicine

## 2015-04-06 NOTE — Telephone Encounter (Signed)
Corinne to help please

## 2015-04-07 ENCOUNTER — Telehealth: Payer: Self-pay

## 2015-04-07 MED ORDER — FLUOXETINE HCL 20 MG PO TABS: 20.0000 mg | ORAL_TABLET | Freq: Every day | ORAL | Status: DC

## 2015-04-07 NOTE — Telephone Encounter (Signed)
Medication ordered and sent to new pharmacy

## 2015-04-08 ENCOUNTER — Other Ambulatory Visit: Payer: Self-pay

## 2015-04-08 MED ORDER — FLUOXETINE HCL 20 MG PO TABS: 20.0000 mg | ORAL_TABLET | Freq: Every day | ORAL | Status: DC

## 2015-04-11 ENCOUNTER — Telehealth: Payer: Self-pay | Admitting: Internal Medicine

## 2015-04-11 MED ORDER — FLUOXETINE HCL 20 MG PO TABS: 20.0000 mg | ORAL_TABLET | Freq: Every day | ORAL | Status: DC

## 2015-04-11 NOTE — Telephone Encounter (Signed)
Pt is stating that [facility] still has not received prescription FLUoxetine (PROZAC) 20 MG tablet [id number] She states that Corrine has sent it over several times.  I double checked pharmacy's numbers with her. Can you please send it again to [facility]

## 2015-04-11 NOTE — Telephone Encounter (Signed)
Faxed rx to [facility]...Johny Chess

## 2015-04-27 ENCOUNTER — Telehealth: Payer: Self-pay

## 2015-04-27 ENCOUNTER — Encounter: Payer: Self-pay | Admitting: Internal Medicine

## 2015-04-27 ENCOUNTER — Other Ambulatory Visit (INDEPENDENT_AMBULATORY_CARE_PROVIDER_SITE_OTHER): Payer: BLUE CROSS/BLUE SHIELD

## 2015-04-27 ENCOUNTER — Ambulatory Visit (INDEPENDENT_AMBULATORY_CARE_PROVIDER_SITE_OTHER): Payer: BLUE CROSS/BLUE SHIELD | Admitting: Internal Medicine

## 2015-04-27 ENCOUNTER — Other Ambulatory Visit: Payer: Self-pay | Admitting: Internal Medicine

## 2015-04-27 VITALS — BP 124/72 | HR 68 | Temp 98.0°F | Resp 20 | Wt 174.0 lb

## 2015-04-27 DIAGNOSIS — E119 Type 2 diabetes mellitus without complications: Secondary | ICD-10-CM

## 2015-04-27 DIAGNOSIS — E785 Hyperlipidemia, unspecified: Secondary | ICD-10-CM

## 2015-04-27 DIAGNOSIS — I10 Essential (primary) hypertension: Secondary | ICD-10-CM | POA: Diagnosis not present

## 2015-04-27 DIAGNOSIS — J019 Acute sinusitis, unspecified: Secondary | ICD-10-CM | POA: Insufficient documentation

## 2015-04-27 DIAGNOSIS — N183 Chronic kidney disease, stage 3 unspecified: Secondary | ICD-10-CM

## 2015-04-27 DIAGNOSIS — R059 Cough, unspecified: Secondary | ICD-10-CM | POA: Insufficient documentation

## 2015-04-27 DIAGNOSIS — R05 Cough: Secondary | ICD-10-CM

## 2015-04-27 DIAGNOSIS — Z0000 Encounter for general adult medical examination without abnormal findings: Secondary | ICD-10-CM

## 2015-04-27 DIAGNOSIS — J309 Allergic rhinitis, unspecified: Secondary | ICD-10-CM

## 2015-04-27 LAB — LIPID PANEL
Cholesterol: 192 mg/dL (ref 0–200)
HDL: 50.4 mg/dL (ref >39.00)
LDL Cholesterol: 131 mg/dL — ABNORMAL HIGH (ref 0–99)
NonHDL: 141.9
Total CHOL/HDL Ratio: 4
Triglycerides: 55 mg/dL (ref 0.0–149.0)
VLDL: 11 mg/dL (ref 0.0–40.0)

## 2015-04-27 LAB — CBC WITH DIFFERENTIAL/PLATELET
Basophils Absolute: 0 10*3/uL (ref 0.0–0.1)
Basophils Relative: 0.2 % (ref 0.0–3.0)
Eosinophils Absolute: 0.1 10*3/uL (ref 0.0–0.7)
Eosinophils Relative: 1.7 % (ref 0.0–5.0)
HCT: 43.1 % (ref 36.0–46.0)
Hemoglobin: 14.7 g/dL (ref 12.0–15.0)
Lymphocytes Relative: 22.9 % (ref 12.0–46.0)
Lymphs Abs: 1.8 10*3/uL (ref 0.7–4.0)
MCHC: 34 g/dL (ref 30.0–36.0)
MCV: 91.3 fl (ref 78.0–100.0)
Monocytes Absolute: 0.5 10*3/uL (ref 0.1–1.0)
Monocytes Relative: 6.4 % (ref 3.0–12.0)
Neutro Abs: 5.5 10*3/uL (ref 1.4–7.7)
Neutrophils Relative %: 68.8 % (ref 43.0–77.0)
Platelets: 210 10*3/uL (ref 150.0–400.0)
RBC: 4.72 Mil/uL (ref 3.87–5.11)
RDW: 12.9 % (ref 11.5–15.5)
WBC: 7.9 10*3/uL (ref 4.0–10.5)

## 2015-04-27 LAB — URINALYSIS, ROUTINE W REFLEX MICROSCOPIC
Bilirubin Urine: NEGATIVE
Hgb urine dipstick: NEGATIVE
Ketones, ur: NEGATIVE
Leukocytes, UA: NEGATIVE
Nitrite: NEGATIVE
RBC / HPF: NONE SEEN
Specific Gravity, Urine: 1.02
Total Protein, Urine: NEGATIVE
Urine Glucose: NEGATIVE
Urobilinogen, UA: 0.2
WBC, UA: NONE SEEN
pH: 6 (ref 5.0–8.0)

## 2015-04-27 LAB — BASIC METABOLIC PANEL WITH GFR
BUN: 36 mg/dL — ABNORMAL HIGH (ref 6–23)
CO2: 25 meq/L (ref 19–32)
Calcium: 9.5 mg/dL (ref 8.4–10.5)
Chloride: 105 meq/L (ref 96–112)
Creatinine, Ser: 1.46 mg/dL — ABNORMAL HIGH (ref 0.40–1.20)
GFR: 39.05 mL/min — ABNORMAL LOW
Glucose, Bld: 98 mg/dL (ref 70–99)
Potassium: 5 meq/L (ref 3.5–5.1)
Sodium: 139 meq/L (ref 135–145)

## 2015-04-27 LAB — HEPATIC FUNCTION PANEL
ALT: 17 U/L (ref 0–35)
AST: 13 U/L (ref 0–37)
Albumin: 4.4 g/dL (ref 3.5–5.2)
Alkaline Phosphatase: 64 U/L (ref 39–117)
Bilirubin, Direct: 0.1 mg/dL (ref 0.0–0.3)
Total Bilirubin: 0.4 mg/dL (ref 0.2–1.2)
Total Protein: 6.9 g/dL (ref 6.0–8.3)

## 2015-04-27 LAB — MICROALBUMIN / CREATININE URINE RATIO
Creatinine,U: 210.9 mg/dL
Microalb Creat Ratio: 1.2 mg/g (ref 0.0–30.0)
Microalb, Ur: 2.5 mg/dL — ABNORMAL HIGH (ref 0.0–1.9)

## 2015-04-27 LAB — HEMOGLOBIN A1C: Hgb A1c MFr Bld: 5.9 % (ref 4.6–6.5)

## 2015-04-27 LAB — TSH: TSH: 0.35 u[IU]/mL (ref 0.35–4.50)

## 2015-04-27 MED ORDER — OMEPRAZOLE 20 MG PO CPDR: 20.0000 mg | DELAYED_RELEASE_CAPSULE | Freq: Every day | ORAL | Status: DC

## 2015-04-27 MED ORDER — VITAMIN D3 25 MCG (1000 UT) PO CAPS
2000.0000 [IU] | ORAL_CAPSULE | Freq: Every day | ORAL | Status: AC
Start: 2015-04-27 — End: ?

## 2015-04-27 MED ORDER — LISINOPRIL 10 MG PO TABS: 10.0000 mg | ORAL_TABLET | Freq: Every day | ORAL | Status: DC

## 2015-04-27 MED ORDER — AZITHROMYCIN 250 MG PO TABS: ORAL_TABLET | ORAL | Status: DC

## 2015-04-27 MED ORDER — FLUOXETINE HCL 20 MG PO TABS: 20.0000 mg | ORAL_TABLET | Freq: Every day | ORAL | Status: DC

## 2015-04-27 MED ORDER — LEVOTHYROXINE SODIUM 112 MCG PO TABS: 112.0000 ug | ORAL_TABLET | Freq: Every day | ORAL | Status: DC

## 2015-04-27 MED ORDER — FEXOFENADINE HCL 180 MG PO TABS: 180.0000 mg | ORAL_TABLET | ORAL | Status: DC

## 2015-04-27 MED ORDER — LOVASTATIN 20 MG PO TABS: 20.0000 mg | ORAL_TABLET | Freq: Every day | ORAL | Status: DC

## 2015-04-27 NOTE — Progress Notes (Signed)
Subjective:    Patient ID: Heather Dawson, female    DOB: 1955-10-25, 60 y.o.   MRN: [id number]  HPI  Here for wellness and f/u;  Overall doing ok;  Pt denies Chest pain, worsening SOB, DOE, wheezing, orthopnea, PND, worsening LE edema, palpitations, dizziness or syncope.  Pt denies neurological change such as new headache, facial or extremity weakness.  Pt denies polydipsia, polyuria, or low sugar symptoms. Pt states overall good compliance with treatment and medications, good tolerability, and has been trying to follow appropriate diet.  Pt denies worsening depressive symptoms, suicidal ideation or panic. No fever, night sweats, wt loss, loss of appetite, or other constitutional symptoms.  Pt states good ability with ADL's, has low fall risk, home safety reviewed and adequate, no other significant changes in hearing or vision, and only occasionally active with exercise. Does have several wks ongoing nasal allergy symptoms with clearish congestion, itch and sneezing, without fever, pain, ST, cough, swelling or wheezing. Wt Readings from Last 3 Encounters:  04/27/15 174 lb (78.926 kg)  02/04/15 173 lb 3.2 oz (78.563 kg)  11/10/14 183 lb (83.008 kg)   BP Readings from Last 3 Encounters:  04/27/15 124/72  02/04/15 164/80  11/10/14 132/84   Lab Results  Component Value Date   CREATININE 1.47* 11/10/2014  Pt has solitary functional kidney.  ALso incidentally today - Here with acute onset mild to mod 2-3 days ST, HA, general weakness and malaise, with prod cough greenish sputum.   Past Medical History  Diagnosis Date  . Abdominal pain, epigastric 12/20/2006  . ALLERGIC RHINITIS 12/25/2006  . BACK PAIN 06/15/2008  . BRADYCARDIA 05/04/2008  . Carpal tunnel syndrome 10/16/2006  . Cellulitis and abscess of other specified site 06/15/2008  . CHEST PAIN 09/08/2009  . CIRRHOSIS 10/16/2006  . COMMON MIGRAINE 12/20/2006  . CONTUSION, LOWER LEG 09/15/2008  . Esophageal reflux 12/25/2006  . HYPERLIPIDEMIA  12/25/2006  . HYPERTENSION 08/15/2006  . HYPOTHYROIDISM 08/15/2006  . MIGRAINE HEADACHE 10/16/2006  . Obesity 10/16/2006  . OVARIAN CYST 12/20/2006  . SHINGLES, HX OF 12/20/2006  . TRANSIENT ISCHEMIC ATTACKS, HX OF 12/20/2006  . VITAMIN B12 DEFICIENCY 01/09/2007  . VERTIGO 09/15/2007  . Psoriasis 01/03/2011  . Allergy   . ANXIETY 10/16/2006    no per pt  . Blood transfusion without reported diagnosis   . DEPRESSION 10/16/2006    no per pt  . CKD (chronic kidney disease) stage 3, GFR 30-59 ml/min 10/16/2006    Qualifier: Diagnosis of  By: Jenny Reichmann MD, [place] Oris   . CKD (chronic kidney disease)     2008- had Ecoli and caused renal failure, no problems since then  . Stroke [facility])     3 strokes 2008-no deficits, only on ASA  . Left-sided carotid artery disease ([place])   . Complication of anesthesia     awake during 2 surgeries  . Kidney failure, acute ([place]) 2009    as a result of severe E Coli infection  . Chronic low back pain   . Arthritis     right shoulder  . Hyperlipidemia   . Hypertension    Past Surgical History  Procedure Laterality Date  . Abdominal hysterectomy  02/2006  . Cholecystectomy    . Neck surgery  2005  . Colonoscopy    . Cesarean section    . Anterior cervical decomp/discectomy fusion  2005  . Rotator cuff repair Right 02/2014    Dr Veverly Fells  . Endarterectomy Left 08/25/2014  Procedure: LEFT CAROTID ENDARTERECTOMY WITH HEMASHIELD PATCH ANGIOPLASTY;  Surgeon: Elam Dutch, MD;  Location: [place];  Service: Vascular;  Laterality: Left;    reports that she has quit smoking. She has never used smokeless tobacco. She reports that she does not drink alcohol or use illicit drugs. family history includes Diabetes in her father, other, and sister; Heart disease in her brother, brother, father, and mother; Hypertension in her brother, brother, and other; Lupus in her mother; Raynaud syndrome in her mother; Stroke in her father. There is no history of Colon cancer, Esophageal  cancer, Rectal cancer, or Stomach cancer. Allergies  Allergen Reactions  . Cefuroxime Axetil     REACTION: edema/swelling  . Ciprofloxacin Swelling  . Lipitor [Atorvastatin] Other (See Comments)    Myalgia    Current Outpatient Prescriptions on File Prior to Visit  Medication Sig Dispense Refill  . acetaminophen (TYLENOL) 500 MG tablet Take 1,000 mg by mouth every 8 (eight) hours as needed (pain).    Marland Kitchen aspirin 325 MG EC tablet Take 325 mg by mouth daily.      . Calcium Carbonate (CALCIUM 600 PO) Take 1 tablet by mouth daily.     . Cholecalciferol (VITAMIN D3) 1000 UNITS CAPS Take 2,000 Units by mouth daily.     . fexofenadine (ALLEGRA) 180 MG tablet Take 180 mg by mouth every other day.     Marland Kitchen FLUoxetine (PROZAC) 20 MG tablet Take 1 tablet (20 mg total) by mouth daily. 90 tablet 2  . levothyroxine (SYNTHROID, LEVOTHROID) 112 MCG tablet Take 1 tablet (112 mcg total) by mouth daily. 90 tablet 3  . lisinopril (PRINIVIL,ZESTRIL) 10 MG tablet Take 1 tablet (10 mg total) by mouth daily. 90 tablet 3  . lovastatin (MEVACOR) 20 MG tablet Take 1 tablet (20 mg total) by mouth at bedtime. 90 tablet 3  . omeprazole (PRILOSEC) 20 MG capsule Take 1 capsule (20 mg total) by mouth daily. 90 capsule 3   No current facility-administered medications on file prior to visit.    Review of Systems  Constitutional: Negative for increased diaphoresis, other activity, appetite or siginficant weight change other than noted HENT: Negative for worsening hearing loss, ear pain, facial swelling, mouth sores and neck stiffness.   Eyes: Negative for other worsening pain, redness or visual disturbance.  Respiratory: Negative for shortness of breath and wheezing  Cardiovascular: Negative for chest pain and palpitations.  Gastrointestinal: Negative for diarrhea, blood in stool, abdominal distention or other pain Genitourinary: Negative for hematuria, flank pain or change in urine volume.  Musculoskeletal: Negative for  myalgias or other joint complaints.  Skin: Negative for color change and wound or drainage.  Neurological: Negative for syncope and numbness. other than noted Hematological: Negative for adenopathy. or other swelling Psychiatric/Behavioral: Negative for hallucinations, SI, self-injury, decreased concentration or other worsening agitation.      Objective:   Physical Exam BP 124/72 mmHg  Pulse 68  Temp(Src) 98 F (36.7 C) (Oral)  Resp 20  Wt 174 lb (78.926 kg)  SpO2 97% VS noted, mild ill Constitutional: Pt is oriented to person, place, and time. Appears well-developed and well-nourished, in no significant distress Head: Normocephalic and atraumatic.  Right Ear: External ear normal.  Left Ear: External ear normal.  Nose: Nose normal.  Mouth/Throat: Oropharynx is clear and moist.  Bilat tm's with mild erythema.  Max sinus areas mild tender.  Pharynx with mild erythema, no exudate Eyes: Conjunctivae and EOM are normal. Pupils are equal, round, and reactive  to light.  Neck: Normal range of motion. Neck supple. No JVD present. No tracheal deviation present or significant neck LA or mass Cardiovascular: Normal rate, regular rhythm, normal heart sounds and intact distal pulses.   Pulmonary/Chest: Effort normal and breath sounds without rales or wheezing  Abdominal: Soft. Bowel sounds are normal. NT. No HSM  Musculoskeletal: Normal range of motion. Exhibits no edema.  Lymphadenopathy:  Has no cervical adenopathy.  Neurological: Pt is alert and oriented to person, place, and time. Pt has normal reflexes. No cranial nerve deficit. Motor grossly intact Skin: Skin is warm and dry. No rash noted.  Psychiatric:  Has normal mood and affect. Behavior is normal.     Assessment & Plan:

## 2015-04-27 NOTE — Assessment & Plan Note (Signed)
Mild to mod, c/w bronchitis vs pna, declines cxr, for antibx course, to f/u any worsening symptoms or concerns  

## 2015-04-27 NOTE — Telephone Encounter (Signed)
A user error has taken place.

## 2015-04-27 NOTE — Progress Notes (Signed)
Pre visit review using our clinic review tool, if applicable. No additional management support is needed unless otherwise documented below in the visit note. 

## 2015-04-27 NOTE — Assessment & Plan Note (Signed)
With solitary functional kidney, on low dose ACE, cr improved recently, to cont to monitor on same tx, consider renal referral

## 2015-04-27 NOTE — Telephone Encounter (Signed)
meds sent

## 2015-04-27 NOTE — Assessment & Plan Note (Signed)
With recent 46 lb wt loss per pt and maintaining wt loff, now diet controlled, for lab f/u today

## 2015-04-27 NOTE — Assessment & Plan Note (Signed)
Also ok for nasacort use as she is doing,  to f/u any worsening symptoms or concerns

## 2015-04-27 NOTE — Assessment & Plan Note (Signed)
stable overall by history and exam, recent data reviewed with pt, and pt to continue medical treatment as before,  to f/u any worsening symptoms or concerns Lab Results  Component Value Date   LDLCALC 127* 11/10/2014

## 2015-04-27 NOTE — Assessment & Plan Note (Signed)
stable overall by history and exam, recent data reviewed with pt, and pt to continue medical treatment as before,  to f/u any worsening symptoms or concerns BP Readings from Last 3 Encounters:  04/27/15 124/72  02/04/15 164/80  11/10/14 132/84

## 2015-04-27 NOTE — Patient Instructions (Signed)
You had the Prevnar pneumonia shot today  Please take all new medication as prescribed - the antibiotic  Ok to use the Nasacort as you mentioned  Please continue all other medications as before, and refills have been done if requested.  Please have the pharmacy call with any other refills you may need.  Please continue your efforts at being more active, low cholesterol diet, and weight control.  You are otherwise up to date with prevention measures today.  Please keep your appointments with your specialists as you may have planned  Please go to the LAB in the Basement (turn left off the elevator) for the tests to be done today  You will be contacted by phone if any changes need to be made immediately.  Otherwise, you will receive a letter about your results with an explanation, but please check with MyChart first.  Please remember to sign up for MyChart if you have not done so, as this will be important to you in the future with finding out test results, communicating by private email, and scheduling acute appointments online when needed.  Please return in 6 months, or sooner if needed, with Lab testing done 3-5 days before

## 2015-04-28 LAB — HEPATITIS C ANTIBODY: HCV Ab: NEGATIVE

## 2015-05-02 ENCOUNTER — Telehealth: Payer: Self-pay

## 2015-05-02 MED ORDER — LOVASTATIN 40 MG PO TABS: 40.0000 mg | ORAL_TABLET | Freq: Every day | ORAL | Status: DC

## 2015-05-02 NOTE — Telephone Encounter (Signed)
Pt informed and agreed

## 2015-05-02 NOTE — Telephone Encounter (Signed)
Patient calling back regarding her cholesterol medication. She left an email also wanting to know if Dr. Jenny Reichmann is going to send a new prescription in. Please give her a call

## 2015-05-02 NOTE — Telephone Encounter (Signed)
Patient called and is wondering since she just got her cholesterol medication refilled does she need to take two a day or get a whole new prescription. Please advise

## 2015-05-02 NOTE — Addendum Note (Signed)
Addended by: Biagio Borg on: 05/02/2015 12:25 PM   Modules accepted: Orders, Medications

## 2015-05-02 NOTE — Telephone Encounter (Signed)
Ok to increase the lovastatin to 40 mg per day  I will send new rx  (and she can 2 of what she has to finish them up)

## 2015-11-29 ENCOUNTER — Encounter: Payer: Self-pay | Admitting: Vascular Surgery

## 2015-12-01 ENCOUNTER — Ambulatory Visit (HOSPITAL_COMMUNITY)
Admission: RE | Admit: 2015-12-01 | Discharge: 2015-12-01 | Disposition: A | Discharge status: Home / Self Care | Payer: BLUE CROSS/BLUE SHIELD | Source: Ambulatory Visit | Attending: Vascular Surgery | Admitting: Vascular Surgery

## 2015-12-01 ENCOUNTER — Ambulatory Visit (INDEPENDENT_AMBULATORY_CARE_PROVIDER_SITE_OTHER): Payer: BLUE CROSS/BLUE SHIELD | Admitting: Vascular Surgery

## 2015-12-01 ENCOUNTER — Encounter: Payer: Self-pay | Admitting: Vascular Surgery

## 2015-12-01 VITALS — BP 180/88 | HR 56 | Temp 97.4°F | Resp 20 | Ht 62.0 in | Wt 197.2 lb

## 2015-12-01 DIAGNOSIS — Z48812 Encounter for surgical aftercare following surgery on the circulatory system: Secondary | ICD-10-CM

## 2015-12-01 DIAGNOSIS — I6522 Occlusion and stenosis of left carotid artery: Secondary | ICD-10-CM | POA: Diagnosis not present

## 2015-12-01 DIAGNOSIS — I6523 Occlusion and stenosis of bilateral carotid arteries: Secondary | ICD-10-CM

## 2015-12-01 LAB — VAS US CAROTID
LEFT ECA DIAS: -26 cm/s
LEFT VERTEBRAL DIAS: -8 cm/s
Left CCA dist dias: -26 cm/s
Left CCA dist sys: -76 cm/s
Left CCA prox dias: 24 cm/s
Left CCA prox sys: 97 cm/s
Left ICA prox dias: -22 cm/s
Left ICA prox sys: -93 cm/s
RIGHT CCA MID DIAS: 19 cm/s
RIGHT ECA DIAS: -8 cm/s
RIGHT VERTEBRAL DIAS: 22 cm/s
Right cca dist sys: -81 cm/s

## 2015-12-01 NOTE — Progress Notes (Signed)
Patient is a 59 year old female who returns today for follow-up of her carotid occlusive disease. She underwent a left carotid endarterectomy 14 months ago. She has done well from this. She continues to deny any symptoms of TIA amaurosis or stroke. She has had several orthopedic problems recently and is scheduled in the near future for cervical fusion by Dr. Rolena Infante. She also continues to lead up some from a right sacroiliac joint problem. The chronic medical problems include CK D3, lipidemia and hypertension all of which are currently stable. She is on aspirin and Mevacor.  Current Outpatient Prescriptions on File Prior to Visit  Medication Sig Dispense Refill  . acetaminophen (TYLENOL) 500 MG tablet Take 1,000 mg by mouth every 8 (eight) hours as needed (pain).    Marland Kitchen aspirin 325 MG EC tablet Take 325 mg by mouth daily.      . Cholecalciferol (VITAMIN D3) 1000 units CAPS Take 2 capsules (2,000 Units total) by mouth daily. 60 capsule 0  . fexofenadine (ALLEGRA) 180 MG tablet Take 1 tablet (180 mg total) by mouth every other day. 15 tablet 3  . FLUoxetine (PROZAC) 20 MG tablet Take 1 tablet (20 mg total) by mouth daily. 90 tablet 2  . levothyroxine (SYNTHROID, LEVOTHROID) 112 MCG tablet Take 1 tablet (112 mcg total) by mouth daily. 90 tablet 3  . lisinopril (PRINIVIL,ZESTRIL) 10 MG tablet Take 1 tablet (10 mg total) by mouth daily. 90 tablet 3  . lovastatin (MEVACOR) 40 MG tablet Take 1 tablet (40 mg total) by mouth at bedtime. 90 tablet 3  . omeprazole (PRILOSEC) 20 MG capsule Take 1 capsule (20 mg total) by mouth daily. 90 capsule 3  . SUMAtriptan (IMITREX) 100 MG tablet TAKE 1 TABLET BY MOUTH EVERY 2 HOURS AS NEEDED FOR MIGRAINE OR HEADACHE.  MAY REPEAT IN 2 HOURS IF HEADACHE PERSISTS OR RECURS. 10 tablet 5  . azithromycin (ZITHROMAX Z-PAK) 250 MG tablet Use as directed (Patient not taking: Reported on 12/01/2015) 6 tablet 1  . Calcium Carbonate (CALCIUM 600 PO) Take 1 tablet by mouth daily.      No  current facility-administered medications on file prior to visit.      Past Medical History:  Diagnosis Date  . Abdominal pain, epigastric 12/20/2006  . ALLERGIC RHINITIS 12/25/2006  . Allergy   . ANXIETY 10/16/2006   no per pt  . Arthritis    right shoulder  . BACK PAIN 06/15/2008  . Blood transfusion without reported diagnosis   . BRADYCARDIA 05/04/2008  . Carpal tunnel syndrome 10/16/2006  . Cellulitis and abscess of other specified site 06/15/2008  . CHEST PAIN 09/08/2009  . Chronic low back pain   . CIRRHOSIS 10/16/2006  . CKD (chronic kidney disease)    2008- had Ecoli and caused renal failure, no problems since then  . CKD (chronic kidney disease) stage 3, GFR 30-59 ml/min 10/16/2006   Qualifier: Diagnosis of  By: Jenny Reichmann MD, [place] 12/20/2006  . Complication of anesthesia    awake during 2 surgeries  . CONTUSION, LOWER LEG 09/15/2008  . DEPRESSION 10/16/2006   no per pt  . Esophageal reflux 12/25/2006  . HYPERLIPIDEMIA 12/25/2006  . Hyperlipidemia   . HYPERTENSION 08/15/2006  . Hypertension   . HYPOTHYROIDISM 08/15/2006  . Kidney failure, acute ([place]) 2009   as a result of severe E Coli infection  . Left-sided carotid artery disease ([place])   . MIGRAINE HEADACHE 10/16/2006  . Obesity 10/16/2006  . OVARIAN CYST  12/20/2006  . Psoriasis 01/03/2011  . SHINGLES, HX OF 12/20/2006  . Stroke [facility])    3 strokes 2008-no deficits, only on ASA  . TRANSIENT ISCHEMIC ATTACKS, HX OF 12/20/2006  . VERTIGO 09/15/2007  . VITAMIN B12 DEFICIENCY 01/09/2007   Past Surgical History:  Procedure Laterality Date  . ABDOMINAL HYSTERECTOMY  02/2006  . ANTERIOR CERVICAL DECOMP/DISCECTOMY FUSION  2005  . CESAREAN SECTION    . CHOLECYSTECTOMY    . COLONOSCOPY    . ENDARTERECTOMY Left 08/25/2014   Procedure: LEFT CAROTID ENDARTERECTOMY WITH HEMASHIELD PATCH ANGIOPLASTY;  Surgeon: Elam Dutch, MD;  Location: [place];  Service: Vascular;  Laterality: Left;  . NECK SURGERY  2005  .  ROTATOR CUFF REPAIR Right 02/2014   Dr Veverly Fells     Review of systems: She denies shortness of breath. She denies chest pain.  Physical exam:  Vitals:   12/01/15 1236 12/01/15 1247  BP: (!) 173/94 (!) 180/88  Pulse: (!) 56   Resp: 20   Temp: 97.4 F (36.3 C)   TempSrc: Oral   SpO2: 97%   Weight: 197 lb 3.2 oz (89.4 kg)   Height: 5\' 2"  (1.575 m)    HEENT: Normal  Neck: No carotid bruits well-healed left neck scar  Chest: Clear to auscultation bilaterally  Cardiac: Regular rate and rhythm without murmur  Abdomen: Soft nontender  Data: Patient had a carotid duplex scan today which showed no significant stenosis on the left side and minimal disease on the right side  Assessment: Doing well status post left carotid endarterectomy  Plan: Follow-up carotid duplex scan in 1 year continue aspirin and Mevacor.  Ruta Hinds, MD [facility] Office: [phone] Pager: [phone]

## 2015-12-20 ENCOUNTER — Ambulatory Visit (INDEPENDENT_AMBULATORY_CARE_PROVIDER_SITE_OTHER): Payer: BLUE CROSS/BLUE SHIELD | Admitting: Internal Medicine

## 2015-12-20 ENCOUNTER — Encounter: Payer: Self-pay | Admitting: Internal Medicine

## 2015-12-20 ENCOUNTER — Other Ambulatory Visit (INDEPENDENT_AMBULATORY_CARE_PROVIDER_SITE_OTHER): Payer: BLUE CROSS/BLUE SHIELD

## 2015-12-20 VITALS — BP 136/74 | HR 73 | Temp 98.6°F | Resp 20 | Wt 201.8 lb

## 2015-12-20 DIAGNOSIS — E785 Hyperlipidemia, unspecified: Secondary | ICD-10-CM

## 2015-12-20 DIAGNOSIS — Z23 Encounter for immunization: Secondary | ICD-10-CM | POA: Diagnosis not present

## 2015-12-20 DIAGNOSIS — Z0001 Encounter for general adult medical examination with abnormal findings: Secondary | ICD-10-CM | POA: Diagnosis not present

## 2015-12-20 DIAGNOSIS — I10 Essential (primary) hypertension: Secondary | ICD-10-CM | POA: Diagnosis not present

## 2015-12-20 DIAGNOSIS — E119 Type 2 diabetes mellitus without complications: Secondary | ICD-10-CM

## 2015-12-20 LAB — BASIC METABOLIC PANEL
BUN: 24 mg/dL — ABNORMAL HIGH (ref 6–23)
CO2: 27 mEq/L (ref 19–32)
Calcium: 9.6 mg/dL (ref 8.4–10.5)
Chloride: 106 mEq/L (ref 96–112)
Creatinine, Ser: 1.47 mg/dL — ABNORMAL HIGH (ref 0.40–1.20)
GFR: 38.66 mL/min — ABNORMAL LOW (ref >60.00)
Glucose, Bld: 115 mg/dL — ABNORMAL HIGH (ref 70–99)
Potassium: 4.5 mEq/L (ref 3.5–5.1)
Sodium: 141 mEq/L (ref 135–145)

## 2015-12-20 LAB — HEPATIC FUNCTION PANEL
ALT: 18 U/L (ref 0–35)
AST: 15 U/L (ref 0–37)
Albumin: 4.2 g/dL (ref 3.5–5.2)
Alkaline Phosphatase: 67 U/L (ref 39–117)
Bilirubin, Direct: 0.1 mg/dL (ref 0.0–0.3)
Total Bilirubin: 0.5 mg/dL (ref 0.2–1.2)
Total Protein: 6.7 g/dL (ref 6.0–8.3)

## 2015-12-20 LAB — LIPID PANEL
Cholesterol: 177 mg/dL (ref 0–200)
HDL: 43.6 mg/dL (ref >39.00)
NonHDL: 133.57
Total CHOL/HDL Ratio: 4
Triglycerides: 203 mg/dL — ABNORMAL HIGH (ref 0.0–149.0)
VLDL: 40.6 mg/dL — ABNORMAL HIGH (ref 0.0–40.0)

## 2015-12-20 LAB — HEMOGLOBIN A1C: Hgb A1c MFr Bld: 6 % (ref 4.6–6.5)

## 2015-12-20 LAB — LDL CHOLESTEROL, DIRECT: Direct LDL: 109 mg/dL

## 2015-12-20 NOTE — Patient Instructions (Signed)

## 2015-12-20 NOTE — Progress Notes (Signed)
Subjective:    Patient ID: Heather Dawson, female    DOB: July 23, 1955, 60 y.o.   MRN: [id number]  HPI Here to f/u; overall doing ok,  Pt denies chest pain, increasing sob or doe, wheezing, orthopnea, PND, increased LE swelling, palpitations, dizziness or syncope.  Pt denies new neurological symptoms such as new headache, or facial or extremity weakness or numbness.  Pt denies polydipsia, polyuria, or low sugar episode.   Pt denies new neurological symptoms such as new headache, or facial or extremity weakness or numbness.   Pt states overall good compliance with meds, mostly trying to follow appropriate diet, with wt overall stable,  but little exercise however. No other new change to hx.  Unfortunately regained some wt after initially lost 40 lbs.   Due for flu shot Wt Readings from Last 3 Encounters:  12/20/15 201 lb 12 oz (91.5 kg)  12/01/15 197 lb 3.2 oz (89.4 kg)  04/27/15 174 lb (78.9 kg)   Past Medical History:  Diagnosis Date  . Abdominal pain, epigastric 12/20/2006  . ALLERGIC RHINITIS 12/25/2006  . Allergy   . ANXIETY 10/16/2006   no per pt  . Arthritis    right shoulder  . BACK PAIN 06/15/2008  . Blood transfusion without reported diagnosis   . BRADYCARDIA 05/04/2008  . Carpal tunnel syndrome 10/16/2006  . Cellulitis and abscess of other specified site 06/15/2008  . CHEST PAIN 09/08/2009  . Chronic low back pain   . CIRRHOSIS 10/16/2006  . CKD (chronic kidney disease)    2008- had Ecoli and caused renal failure, no problems since then  . CKD (chronic kidney disease) stage 3, GFR 30-59 ml/min 10/16/2006   Qualifier: Diagnosis of  By: Jenny Reichmann MD, [place] 12/20/2006  . Complication of anesthesia    awake during 2 surgeries  . CONTUSION, LOWER LEG 09/15/2008  . DEPRESSION 10/16/2006   no per pt  . Esophageal reflux 12/25/2006  . HYPERLIPIDEMIA 12/25/2006  . Hyperlipidemia   . HYPERTENSION 08/15/2006  . Hypertension   . HYPOTHYROIDISM 08/15/2006  . Kidney failure,  acute ([place]) 2009   as a result of severe E Coli infection  . Left-sided carotid artery disease ([place])   . MIGRAINE HEADACHE 10/16/2006  . Obesity 10/16/2006  . OVARIAN CYST 12/20/2006  . Psoriasis 01/03/2011  . SHINGLES, HX OF 12/20/2006  . Stroke [facility])    3 strokes 2008-no deficits, only on ASA  . TRANSIENT ISCHEMIC ATTACKS, HX OF 12/20/2006  . VERTIGO 09/15/2007  . VITAMIN B12 DEFICIENCY 01/09/2007   Past Surgical History:  Procedure Laterality Date  . ABDOMINAL HYSTERECTOMY  02/2006  . ANTERIOR CERVICAL DECOMP/DISCECTOMY FUSION  2005  . CESAREAN SECTION    . CHOLECYSTECTOMY    . COLONOSCOPY    . ENDARTERECTOMY Left 08/25/2014   Procedure: LEFT CAROTID ENDARTERECTOMY WITH HEMASHIELD PATCH ANGIOPLASTY;  Surgeon: Elam Dutch, MD;  Location: [place];  Service: Vascular;  Laterality: Left;  . NECK SURGERY  2005  . ROTATOR CUFF REPAIR Right 02/2014   Dr Veverly Fells    reports that she has quit smoking. She has never used smokeless tobacco. She reports that she does not drink alcohol or use drugs. family history includes Diabetes in her father, other, and sister; Heart disease in her brother, brother, father, and mother; Hypertension in her brother, brother, and other; Lupus in her mother; Raynaud syndrome in her mother; Stroke in her father. Allergies  Allergen Reactions  . Cefuroxime Axetil  REACTION: edema/swelling  . Ciprofloxacin Swelling  . Lipitor [Atorvastatin] Other (See Comments)    Myalgia    Current Outpatient Prescriptions on File Prior to Visit  Medication Sig Dispense Refill  . acetaminophen (TYLENOL) 500 MG tablet Take 1,000 mg by mouth every 8 (eight) hours as needed (pain).    Marland Kitchen aspirin 325 MG EC tablet Take 325 mg by mouth daily.      . calcium citrate-vitamin D 500-400 MG-UNIT chewable tablet Chew 2 tablets by mouth daily.    . Cholecalciferol (VITAMIN D3) 1000 units CAPS Take 2 capsules (2,000 Units total) by mouth daily. 60 capsule 0  . fexofenadine (ALLEGRA)  180 MG tablet Take 1 tablet (180 mg total) by mouth every other day. 15 tablet 3  . FLUoxetine (PROZAC) 20 MG tablet Take 1 tablet (20 mg total) by mouth daily. 90 tablet 2  . levothyroxine (SYNTHROID, LEVOTHROID) 112 MCG tablet Take 1 tablet (112 mcg total) by mouth daily. 90 tablet 3  . lisinopril (PRINIVIL,ZESTRIL) 10 MG tablet Take 1 tablet (10 mg total) by mouth daily. 90 tablet 3  . lovastatin (MEVACOR) 40 MG tablet Take 1 tablet (40 mg total) by mouth at bedtime. 90 tablet 3  . omeprazole (PRILOSEC) 20 MG capsule Take 1 capsule (20 mg total) by mouth daily. 90 capsule 3  . SUMAtriptan (IMITREX) 100 MG tablet TAKE 1 TABLET BY MOUTH EVERY 2 HOURS AS NEEDED FOR MIGRAINE OR HEADACHE.  MAY REPEAT IN 2 HOURS IF HEADACHE PERSISTS OR RECURS. 10 tablet 5   No current facility-administered medications on file prior to visit.    Review of Systems  Constitutional: Negative for unusual diaphoresis or night sweats HENT: Negative for ear swelling or discharge Eyes: Negative for worsening visual haziness  Respiratory: Negative for choking and stridor.   Gastrointestinal: Negative for distension or worsening eructation Genitourinary: Negative for retention or change in urine volume.  Musculoskeletal: Negative for other MSK pain or swelling Skin: Negative for color change and worsening wound Neurological: Negative for tremors and numbness other than noted  Psychiatric/Behavioral: Negative for decreased concentration or agitation other than above   All other system neg per pt    Objective:   Physical Exam BP 136/74   Pulse 73   Temp 98.6 F (37 C) (Oral)   Resp 20   Wt 201 lb 12 oz (91.5 kg)   SpO2 98%   BMI 36.90 kg/m  VS noted, severe obese Constitutional: Pt appears in no apparent distress HENT: Head: NCAT.  Right Ear: External ear normal.  Left Ear: External ear normal.  Eyes: . Pupils are equal, round, and reactive to light. Conjunctivae and EOM are normal Neck: Normal range of  motion. Neck supple.  Cardiovascular: Normal rate and regular rhythm.   Pulmonary/Chest: Effort normal and breath sounds without rales or wheezing.  Neurological: Pt is alert. Not confused , motor grossly intact Skin: Skin is warm. No rash, no LE edema Psychiatric: Pt behavior is normal. No agitation.      Assessment & Plan:

## 2015-12-20 NOTE — Progress Notes (Signed)
Pre visit review using our clinic review tool, if applicable. No additional management support is needed unless otherwise documented below in the visit note. 

## 2015-12-26 NOTE — Assessment & Plan Note (Signed)
stable overall by history and exam, recent data reviewed with pt, and pt to continue medical treatment as before,  to f/u any worsening symptoms or concerns Lab Results  Component Value Date   HGBA1C 6.0 12/20/2015

## 2015-12-26 NOTE — Assessment & Plan Note (Signed)
stable overall by history and exam, recent data reviewed with pt, and pt to continue medical treatment as before as pt declines further statin after did not tolerate lipitor previously,  to f/u any worsening symptoms or concerns

## 2015-12-26 NOTE — Assessment & Plan Note (Signed)
stable overall by history and exam, recent data reviewed with pt, and pt to continue medical treatment as before,  to f/u any worsening symptoms or concerns BP Readings from Last 3 Encounters:  12/20/15 136/74  12/01/15 (!) 180/88  04/27/15 124/72

## 2016-01-09 NOTE — Addendum Note (Signed)
Addended by: Mena Goes on: 01/09/2016 01:58 PM   Modules accepted: Orders

## 2016-01-23 ENCOUNTER — Ambulatory Visit (INDEPENDENT_AMBULATORY_CARE_PROVIDER_SITE_OTHER): Payer: BLUE CROSS/BLUE SHIELD | Admitting: Nurse Practitioner

## 2016-01-23 ENCOUNTER — Encounter: Payer: Self-pay | Admitting: Nurse Practitioner

## 2016-01-23 VITALS — BP 138/100 | HR 79 | Temp 98.6°F | Ht 62.0 in | Wt 200.0 lb

## 2016-01-23 DIAGNOSIS — J029 Acute pharyngitis, unspecified: Secondary | ICD-10-CM | POA: Diagnosis not present

## 2016-01-23 DIAGNOSIS — J0140 Acute pansinusitis, unspecified: Secondary | ICD-10-CM | POA: Diagnosis not present

## 2016-01-23 MED ORDER — DOXYCYCLINE HYCLATE 100 MG PO TABS: 100.0000 mg | ORAL_TABLET | Freq: Two times a day (BID) | ORAL | 0 refills | Status: AC

## 2016-01-23 MED ORDER — FLUTICASONE PROPIONATE 50 MCG/ACT NA SUSP: 2.0000 | Freq: Every day | NASAL | 0 refills | Status: DC

## 2016-01-23 MED ORDER — BENZONATATE 100 MG PO CAPS: 100.0000 mg | ORAL_CAPSULE | Freq: Three times a day (TID) | ORAL | 0 refills | Status: DC | PRN

## 2016-01-23 MED ORDER — OXYMETAZOLINE HCL 0.05 % NA SOLN: 1.0000 | Freq: Two times a day (BID) | NASAL | 0 refills | Status: DC

## 2016-01-23 MED ORDER — GUAIFENESIN-DM 100-10 MG/5ML PO SYRP: 10.0000 mL | ORAL_SOLUTION | Freq: Four times a day (QID) | ORAL | 0 refills | Status: DC | PRN

## 2016-01-23 NOTE — Patient Instructions (Signed)
URI Instructions: Flonase and Afrin use: apply 1spray of afrin in each nare, wait 11mins, then apply 2sprays of flonase in each nare. Use both nasal spray consecutively x 3days, then flonase only for at least 14days.   Avoid decongestants if you have high blood pressure. Use" Delsym" or" Robitussin" cough syrup varietis for cough.  You can use plain "Tylenol" or "Advi"l for fever, chills and achyness.   "Common cold" symptoms are usually triggered by a virus.  The antibiotics are usually not necessary. On average, a" viral cold" illness would take 4-7 days to resolve. Please, make an appointment if you are not better or if you're worse.

## 2016-01-23 NOTE — Progress Notes (Signed)
Subjective:  Patient ID: Heather Dawson, female    DOB: 06-08-1955  Age: 60 y.o. MRN: [id number]  CC: Sore Throat (sore throat,congestion,ears stop up,cough green mucus for 1 wk. took mucinex & cough med (not help). )   Sinusitis  This is a new problem. The current episode started in the past 7 days. The problem has been gradually worsening since onset. There has been no fever. Associated symptoms include coughing, ear pain, headaches, a hoarse voice, sinus pressure, a sore throat and swollen glands. Pertinent negatives include no chills, diaphoresis, neck pain or shortness of breath. Past treatments include acetaminophen and oral decongestants. The treatment provided no relief.    Outpatient Medications Prior to Visit  Medication Sig Dispense Refill  . acetaminophen (TYLENOL) 500 MG tablet Take 1,000 mg by mouth every 8 (eight) hours as needed (pain).    Marland Kitchen aspirin 325 MG EC tablet Take 325 mg by mouth daily.      . calcium citrate-vitamin D 500-400 MG-UNIT chewable tablet Chew 2 tablets by mouth daily.    . Cholecalciferol (VITAMIN D3) 1000 units CAPS Take 2 capsules (2,000 Units total) by mouth daily. 60 capsule 0  . fexofenadine (ALLEGRA) 180 MG tablet Take 1 tablet (180 mg total) by mouth every other day. 15 tablet 3  . FLUoxetine (PROZAC) 20 MG tablet Take 1 tablet (20 mg total) by mouth daily. 90 tablet 2  . levothyroxine (SYNTHROID, LEVOTHROID) 112 MCG tablet Take 1 tablet (112 mcg total) by mouth daily. 90 tablet 3  . lisinopril (PRINIVIL,ZESTRIL) 10 MG tablet Take 1 tablet (10 mg total) by mouth daily. 90 tablet 3  . lovastatin (MEVACOR) 40 MG tablet Take 1 tablet (40 mg total) by mouth at bedtime. 90 tablet 3  . omeprazole (PRILOSEC) 20 MG capsule Take 1 capsule (20 mg total) by mouth daily. 90 capsule 3  . SUMAtriptan (IMITREX) 100 MG tablet TAKE 1 TABLET BY MOUTH EVERY 2 HOURS AS NEEDED FOR MIGRAINE OR HEADACHE.  MAY REPEAT IN 2 HOURS IF HEADACHE PERSISTS OR RECURS. 10 tablet 5    No facility-administered medications prior to visit.     ROS See HPI  Objective:  BP (!) 138/100   Pulse 79   Temp 98.6 F (37 C)   Ht 5\' 2"  (1.575 m)   Wt 200 lb (90.7 kg)   SpO2 99%   BMI 36.58 kg/m   BP Readings from Last 3 Encounters:  01/23/16 (!) 138/100  12/20/15 136/74  12/01/15 (!) 180/88    Wt Readings from Last 3 Encounters:  01/23/16 200 lb (90.7 kg)  12/20/15 201 lb 12 oz (91.5 kg)  12/01/15 197 lb 3.2 oz (89.4 kg)    Physical Exam  Constitutional: She is oriented to person, place, and time. No distress.  HENT:  Right Ear: External ear and ear canal normal. A middle ear effusion is present.  Left Ear: Ear canal normal. A middle ear effusion is present.  Nose: Mucosal edema and rhinorrhea present. Right sinus exhibits maxillary sinus tenderness and frontal sinus tenderness. Left sinus exhibits maxillary sinus tenderness and frontal sinus tenderness.  Mouth/Throat: Uvula is midline. Posterior oropharyngeal erythema present. No oropharyngeal exudate.  Eyes: No scleral icterus.  Neck: Normal range of motion. Neck supple.  Cardiovascular: Normal rate, regular rhythm and normal heart sounds.   Pulmonary/Chest: Effort normal and breath sounds normal.  Musculoskeletal: She exhibits no edema.  Lymphadenopathy:    She has cervical adenopathy.  Neurological: She is alert and oriented  to person, place, and time.  Skin: Skin is warm and dry.  Vitals reviewed.   Lab Results  Component Value Date   WBC 7.9 04/27/2015   HGB 14.7 04/27/2015   HCT 43.1 04/27/2015   PLT 210.0 04/27/2015   GLUCOSE 115 (H) 12/20/2015   CHOL 177 12/20/2015   TRIG 203.0 (H) 12/20/2015   HDL 43.60 12/20/2015   LDLDIRECT 109.0 12/20/2015   LDLCALC 131 (H) 04/27/2015   ALT 18 12/20/2015   AST 15 12/20/2015   NA 141 12/20/2015   K 4.5 12/20/2015   CL 106 12/20/2015   CREATININE 1.47 (H) 12/20/2015   BUN 24 (H) 12/20/2015   CO2 27 12/20/2015   TSH 0.35 04/27/2015   INR 0.99  08/17/2014   HGBA1C 6.0 12/20/2015   MICROALBUR 2.5 (H) 04/27/2015    No results found.  Assessment & Plan:   Helia was seen today for sore throat.  Diagnoses and all orders for this visit:  Acute non-recurrent pansinusitis -     guaiFENesin-dextromethorphan (ROBITUSSIN DM) 100-10 MG/5ML syrup; Take 10 mLs by mouth every 6 (six) hours as needed for cough. -     benzonatate (TESSALON) 100 MG capsule; Take 1 capsule (100 mg total) by mouth 3 (three) times daily as needed for cough. -     fluticasone (FLONASE) 50 MCG/ACT nasal spray; Place 2 sprays into both nostrils daily. -     oxymetazoline (AFRIN NASAL SPRAY) 0.05 % nasal spray; Place 1 spray into both nostrils 2 (two) times daily. Use only for 3days, then stop -     doxycycline (VIBRA-TABS) 100 MG tablet; Take 1 tablet (100 mg total) by mouth 2 (two) times daily.  Acute pharyngitis, unspecified etiology -     guaiFENesin-dextromethorphan (ROBITUSSIN DM) 100-10 MG/5ML syrup; Take 10 mLs by mouth every 6 (six) hours as needed for cough. -     benzonatate (TESSALON) 100 MG capsule; Take 1 capsule (100 mg total) by mouth 3 (three) times daily as needed for cough. -     fluticasone (FLONASE) 50 MCG/ACT nasal spray; Place 2 sprays into both nostrils daily. -     oxymetazoline (AFRIN NASAL SPRAY) 0.05 % nasal spray; Place 1 spray into both nostrils 2 (two) times daily. Use only for 3days, then stop -     doxycycline (VIBRA-TABS) 100 MG tablet; Take 1 tablet (100 mg total) by mouth 2 (two) times daily.   I am having Ms. Gorman start on guaiFENesin-dextromethorphan, benzonatate, fluticasone, oxymetazoline, and doxycycline. I am also having her maintain her aspirin, acetaminophen, omeprazole, lisinopril, levothyroxine, FLUoxetine, fexofenadine, Vitamin D3, SUMAtriptan, lovastatin, calcium citrate-vitamin D, amLODipine, and clobetasol.  Meds ordered this encounter  Medications  . amLODipine (NORVASC) 5 MG tablet    Refill:  5  . clobetasol  (TEMOVATE) 0.05 % external solution    Refill:  0  . guaiFENesin-dextromethorphan (ROBITUSSIN DM) 100-10 MG/5ML syrup    Sig: Take 10 mLs by mouth every 6 (six) hours as needed for cough.    Dispense:  240 mL    Refill:  0    Order Specific Question:   Supervising Provider    Answer:   Cassandria Anger [[id number]]  . benzonatate (TESSALON) 100 MG capsule    Sig: Take 1 capsule (100 mg total) by mouth 3 (three) times daily as needed for cough.    Dispense:  20 capsule    Refill:  0    Order Specific Question:   Supervising Provider    Answer:  PLOTNIKOV, ALEKSEI V [[id number]]  . fluticasone (FLONASE) 50 MCG/ACT nasal spray    Sig: Place 2 sprays into both nostrils daily.    Dispense:  16 g    Refill:  0    Order Specific Question:   Supervising Provider    Answer:   Cassandria Anger [[id number]]  . oxymetazoline (AFRIN NASAL SPRAY) 0.05 % nasal spray    Sig: Place 1 spray into both nostrils 2 (two) times daily. Use only for 3days, then stop    Dispense:  30 mL    Refill:  0    Order Specific Question:   Supervising Provider    Answer:   Cassandria Anger [[id number]]  . doxycycline (VIBRA-TABS) 100 MG tablet    Sig: Take 1 tablet (100 mg total) by mouth 2 (two) times daily.    Dispense:  14 tablet    Refill:  0    Order Specific Question:   Supervising Provider    Answer:   Cassandria Anger [[id number]]    Follow-up: Return if symptoms worsen or fail to improve.  Wilfred Lacy, NP

## 2016-01-23 NOTE — Progress Notes (Signed)
Pre visit review using our clinic review tool, if applicable. No additional management support is needed unless otherwise documented below in the visit note. 

## 2016-02-01 ENCOUNTER — Ambulatory Visit (INDEPENDENT_AMBULATORY_CARE_PROVIDER_SITE_OTHER): Payer: BLUE CROSS/BLUE SHIELD | Admitting: Internal Medicine

## 2016-02-01 ENCOUNTER — Encounter: Payer: Self-pay | Admitting: Internal Medicine

## 2016-02-01 VITALS — BP 140/80 | HR 108 | Temp 98.4°F | Resp 20 | Wt 203.0 lb

## 2016-02-01 DIAGNOSIS — E119 Type 2 diabetes mellitus without complications: Secondary | ICD-10-CM | POA: Diagnosis not present

## 2016-02-01 DIAGNOSIS — I10 Essential (primary) hypertension: Secondary | ICD-10-CM

## 2016-02-01 DIAGNOSIS — J309 Allergic rhinitis, unspecified: Secondary | ICD-10-CM | POA: Diagnosis not present

## 2016-02-01 DIAGNOSIS — M542 Cervicalgia: Secondary | ICD-10-CM | POA: Diagnosis not present

## 2016-02-01 DIAGNOSIS — J069 Acute upper respiratory infection, unspecified: Secondary | ICD-10-CM | POA: Diagnosis not present

## 2016-02-01 MED ORDER — AZITHROMYCIN 250 MG PO TABS: ORAL_TABLET | ORAL | 1 refills | Status: DC

## 2016-02-01 MED ORDER — GABAPENTIN 100 MG PO CAPS: 100.0000 mg | ORAL_CAPSULE | Freq: Three times a day (TID) | ORAL | 3 refills | Status: DC

## 2016-02-01 MED ORDER — HYDROCODONE-HOMATROPINE 5-1.5 MG/5ML PO SYRP: 5.0000 mL | ORAL_SOLUTION | Freq: Four times a day (QID) | ORAL | 0 refills | Status: AC | PRN

## 2016-02-01 NOTE — Patient Instructions (Signed)
Please take all new medication as prescribe - the antibiotic and cough medicine if needed  You should also try OTC Benadryl 50 mg at night before bedtime due to the itching  Please take all new medication as prescribed - the gabapentin at 100 mg at three times per day (which can be increased to 300 mg later at your surgury visit on Dec 29  Please continue all other medications as before, and refills have been done if requested.  Please have the pharmacy call with any other refills you may need.  Please continue your efforts at being more active, low cholesterol diabetic diet, and weight control.  Please keep your appointments with your specialists as you may have planned

## 2016-02-01 NOTE — Progress Notes (Signed)
Subjective:    Patient ID: Heather Dawson, female    DOB: 05-Oct-1955, 60 y.o.   MRN: [id number]  HPI   Here with 2-3 days acute onset fever, facial pain, pressure, headache, general weakness and malaise, and greenish d/c, with mild ST and cough, but pt denies chest pain, wheezing, increased sob or doe, orthopnea, PND, increased LE swelling, palpitations, dizziness or syncope. Does have several wks ongoing nasal allergy symptoms with clearish congestion, itch and sneezing, without fever, pain, ST, cough, swelling or wheezing. Has not been using the  Has most significant itching at night to tops of feet and legs,, cant sleep well for last 2 nights.  Also has persistent neuritic pain at the neck, makes hard to sleep, has not tried gabapentin, has f/u appt with surgury dec 29.  Did not do well with lyrica due to HA, confusion and swelling. Past Medical History:  Diagnosis Date  . Abdominal pain, epigastric 12/20/2006  . ALLERGIC RHINITIS 12/25/2006  . Allergy   . ANXIETY 10/16/2006   no per pt  . Arthritis    right shoulder  . BACK PAIN 06/15/2008  . Blood transfusion without reported diagnosis   . BRADYCARDIA 05/04/2008  . Carpal tunnel syndrome 10/16/2006  . Cellulitis and abscess of other specified site 06/15/2008  . CHEST PAIN 09/08/2009  . Chronic low back pain   . CIRRHOSIS 10/16/2006  . CKD (chronic kidney disease)    2008- had Ecoli and caused renal failure, no problems since then  . CKD (chronic kidney disease) stage 3, GFR 30-59 ml/min 10/16/2006   Qualifier: Diagnosis of  By: Jenny Reichmann MD, [place] 12/20/2006  . Complication of anesthesia    awake during 2 surgeries  . CONTUSION, LOWER LEG 09/15/2008  . DEPRESSION 10/16/2006   no per pt  . Esophageal reflux 12/25/2006  . HYPERLIPIDEMIA 12/25/2006  . Hyperlipidemia   . HYPERTENSION 08/15/2006  . Hypertension   . HYPOTHYROIDISM 08/15/2006  . Kidney failure, acute ([place]) 2009   as a result of severe E Coli infection  .  Left-sided carotid artery disease ([place])   . MIGRAINE HEADACHE 10/16/2006  . Obesity 10/16/2006  . OVARIAN CYST 12/20/2006  . Psoriasis 01/03/2011  . SHINGLES, HX OF 12/20/2006  . Stroke [facility])    3 strokes 2008-no deficits, only on ASA  . TRANSIENT ISCHEMIC ATTACKS, HX OF 12/20/2006  . VERTIGO 09/15/2007  . VITAMIN B12 DEFICIENCY 01/09/2007   Past Surgical History:  Procedure Laterality Date  . ABDOMINAL HYSTERECTOMY  02/2006  . ANTERIOR CERVICAL DECOMP/DISCECTOMY FUSION  2005  . CESAREAN SECTION    . CHOLECYSTECTOMY    . COLONOSCOPY    . ENDARTERECTOMY Left 08/25/2014   Procedure: LEFT CAROTID ENDARTERECTOMY WITH HEMASHIELD PATCH ANGIOPLASTY;  Surgeon: Elam Dutch, MD;  Location: [place];  Service: Vascular;  Laterality: Left;  . NECK SURGERY  2005  . ROTATOR CUFF REPAIR Right 02/2014   Dr Veverly Fells    reports that she has quit smoking. She has never used smokeless tobacco. She reports that she does not drink alcohol or use drugs. family history includes Diabetes in her father, other, and sister; Heart disease in her brother, brother, father, and mother; Hypertension in her brother, brother, and other; Lupus in her mother; Raynaud syndrome in her mother; Stroke in her father. Allergies  Allergen Reactions  . Cefuroxime Axetil     REACTION: edema/swelling  . Ciprofloxacin Swelling  . Lipitor [Atorvastatin] Other (See Comments)  Myalgia    Current Outpatient Prescriptions on File Prior to Visit  Medication Sig Dispense Refill  . acetaminophen (TYLENOL) 500 MG tablet Take 1,000 mg by mouth every 8 (eight) hours as needed (pain).    Marland Kitchen amLODipine (NORVASC) 5 MG tablet   5  . aspirin 325 MG EC tablet Take 325 mg by mouth daily.      . benzonatate (TESSALON) 100 MG capsule Take 1 capsule (100 mg total) by mouth 3 (three) times daily as needed for cough. 20 capsule 0  . calcium citrate-vitamin D 500-400 MG-UNIT chewable tablet Chew 2 tablets by mouth daily.    . Cholecalciferol (VITAMIN  D3) 1000 units CAPS Take 2 capsules (2,000 Units total) by mouth daily. 60 capsule 0  . clobetasol (TEMOVATE) 0.05 % external solution   0  . fexofenadine (ALLEGRA) 180 MG tablet Take 1 tablet (180 mg total) by mouth every other day. 15 tablet 3  . FLUoxetine (PROZAC) 20 MG tablet Take 1 tablet (20 mg total) by mouth daily. 90 tablet 2  . fluticasone (FLONASE) 50 MCG/ACT nasal spray Place 2 sprays into both nostrils daily. 16 g 0  . guaiFENesin-dextromethorphan (ROBITUSSIN DM) 100-10 MG/5ML syrup Take 10 mLs by mouth every 6 (six) hours as needed for cough. 240 mL 0  . levothyroxine (SYNTHROID, LEVOTHROID) 112 MCG tablet Take 1 tablet (112 mcg total) by mouth daily. 90 tablet 3  . lisinopril (PRINIVIL,ZESTRIL) 10 MG tablet Take 1 tablet (10 mg total) by mouth daily. 90 tablet 3  . lovastatin (MEVACOR) 40 MG tablet Take 1 tablet (40 mg total) by mouth at bedtime. 90 tablet 3  . omeprazole (PRILOSEC) 20 MG capsule Take 1 capsule (20 mg total) by mouth daily. 90 capsule 3  . oxymetazoline (AFRIN NASAL SPRAY) 0.05 % nasal spray Place 1 spray into both nostrils 2 (two) times daily. Use only for 3days, then stop 30 mL 0  . SUMAtriptan (IMITREX) 100 MG tablet TAKE 1 TABLET BY MOUTH EVERY 2 HOURS AS NEEDED FOR MIGRAINE OR HEADACHE.  MAY REPEAT IN 2 HOURS IF HEADACHE PERSISTS OR RECURS. 10 tablet 5   No current facility-administered medications on file prior to visit.    Review of Systems  Constitutional: Negative for unusual diaphoresis or night sweats HENT: Negative for ear swelling or discharge Eyes: Negative for worsening visual haziness  Respiratory: Negative for choking and stridor.   Gastrointestinal: Negative for distension or worsening eructation Genitourinary: Negative for retention or change in urine volume.  Musculoskeletal: Negative for other MSK pain or swelling Skin: Negative for color change and worsening wound Neurological: Negative for tremors and numbness other than noted    Psychiatric/Behavioral: Negative for decreased concentration or agitation other than above   All other system neg per pt    Objective:   Physical Exam BP 140/80   Pulse (!) 108   Temp 98.4 F (36.9 C) (Oral)   Resp 20   Wt 203 lb (92.1 kg)   SpO2 98%   BMI 37.13 kg/m  VS noted,  Constitutional: Pt appears in no apparent distress HENT: Head: NCAT.  Right Ear: External ear normal.  Left Ear: External ear normal.  Bilat tm's with mild erythema.  Max sinus areas mild tender.  Pharynx with mild erythema, no exudate Eyes: . Pupils are equal, round, and reactive to light. Conjunctivae and EOM are normal Neck: Normal range of motion. Neck supple.  Cardiovascular: Normal rate and regular rhythm.   Pulmonary/Chest: Effort normal and breath sounds  without rales or wheezing.  Neurological: Pt is alert. Not confused , motor grossly intact Skin: Skin is warm. No rash, no LE edema Psychiatric: Pt behavior is normal. No agitation.  No other new exam findings      Assessment & Plan:

## 2016-02-01 NOTE — Progress Notes (Signed)
Pre visit review using our clinic review tool, if applicable. No additional management support is needed unless otherwise documented below in the visit note. 

## 2016-02-05 NOTE — Assessment & Plan Note (Signed)
,  Mild to mod, for antibx course,  to f/u any worsening symptoms or concerns 

## 2016-02-05 NOTE — Assessment & Plan Note (Signed)
stable overall by history and exam, recent data reviewed with pt, and pt to continue medical treatment as before,  to f/u any worsening symptoms or concerns Lab Results  Component Value Date   HGBA1C 6.0 12/20/2015

## 2016-02-05 NOTE — Assessment & Plan Note (Addendum)
Ok to restart flonase asd,  to f/u any worsening symptoms or concerns, also benadryl for itching

## 2016-02-05 NOTE — Assessment & Plan Note (Signed)
C/w neuritic pain, for trial gabapentin 100 tid, f/u surgury dec 29 as planned

## 2016-02-05 NOTE — Assessment & Plan Note (Signed)
stable overall by history and exam, recent data reviewed with pt, and pt to continue medical treatment as before,  to f/u any worsening symptoms or concerns BP Readings from Last 3 Encounters:  02/01/16 140/80  01/23/16 (!) 138/100  12/20/15 136/74

## 2016-02-10 ENCOUNTER — Telehealth: Payer: Self-pay | Admitting: Internal Medicine

## 2016-02-10 NOTE — Telephone Encounter (Signed)
Ok to work pt in this afternoon, just make sure she knows I would not be able to address other issues at this particular visit unless it directly relates

## 2016-02-10 NOTE — Telephone Encounter (Signed)
Patient Name: Heather Dawson  DOB: [birth year]    Initial Comment Caller States her right arm is broken out in a very itchy red rash   Nurse Assessment  Nurse: Andria Frames, RN, Aeriel Date/Time (Eastern Time): 02/10/2016 9:09:48 AM  Confirm and document reason for call. If symptomatic, describe symptoms. ---Caller states, she has a red itchy rash on her right arm. It started on the bend of the arm and now has spread all the way up the shoulder and goes down to the right arm. She noticed the itching last night. When she got up it was broken out. No other rashes. She also has a small place on the back.  Does the patient have any new or worsening symptoms? ---Yes  Will a triage be completed? ---Yes  Related visit to physician within the last 2 weeks? ---No  Does the PT have any chronic conditions? (i.e. diabetes, asthma, etc.) ---Yes  List chronic conditions. ---hypothryoid, htn, cholesterol  Is this a behavioral health or substance abuse call? ---No     Guidelines    Guideline Title Affirmed Question Affirmed Notes  Rash - Widespread On Drugs Hives or itching    Final Disposition User   See Physician within 24 Hours Hensel, RN, Aeriel    Comments  Caller refused outcome after trying to find an appt and requests a call back. She only wants to go to her doctors office.  Nurse also offered another office caller declined.   Referrals  GO TO FACILITY REFUSED  GO TO FACILITY UNDECIDED   Disagree/Comply: Comply

## 2016-02-10 NOTE — Telephone Encounter (Signed)
Patient is scheduled for Saturday [facility].  Did leave vm letting her know to call back if she would like to be worked in this afternoon.

## 2016-02-11 ENCOUNTER — Encounter: Payer: Self-pay | Admitting: Primary Care

## 2016-02-11 ENCOUNTER — Ambulatory Visit (INDEPENDENT_AMBULATORY_CARE_PROVIDER_SITE_OTHER): Payer: BLUE CROSS/BLUE SHIELD | Admitting: Primary Care

## 2016-02-11 VITALS — BP 116/78 | HR 79 | Temp 98.2°F | Wt 203.5 lb

## 2016-02-11 DIAGNOSIS — R21 Rash and other nonspecific skin eruption: Secondary | ICD-10-CM | POA: Diagnosis not present

## 2016-02-11 MED ORDER — PREDNISONE 10 MG PO TABS: ORAL_TABLET | ORAL | 0 refills | Status: DC

## 2016-02-11 NOTE — Patient Instructions (Signed)
Stop Gabapentin and benadryl cream for now. Follow up with Dr. Jenny Reichmann regarding the neurology evaluation.  Start prednisone tablets. Take three tablets for 2 days, then two tablets for 2 days, then one tablet for 2 days.  You must watch your blood sugars as this prednisone will cause them to increase and may increase cravings.  It was a pleasure meeting you!

## 2016-02-11 NOTE — Progress Notes (Signed)
Subjective:    Patient ID: Heather Dawson, female    DOB: 28-Oct-1955, 60 y.o.   MRN: [id number]  HPI  Heather Dawson is a 60 year old female with a history of allergic rhinitis, psoriasis, CKD, diabetes who presents today with a chief complaint of rash. Her rash is located to the bilateral upper extremities and posterior neck. Her rash originally began on the right anterior forearm which has since improved. This morning she woke up with a rash to the left anterior forearm and lower posterior neck with swelling to her right thumb.   She recently started gabapentin (01/31/16) and benadryl for pruritus to her hands and feet. She continues to reports itching to her hands and feet. No one else in her family is itching or has developed a rash. She denies changes in soaps, detergents, food. She also denies shortness of breath, throat tightness. She's not been outdoors in wooded areas. Overall she's noticed some improvement in itching with Gabapentin, but it mainly causes drowsiness. She did not take her dose of Gabapentin yesterday and woke up with improvement to the rash of her right upper extremity.   Review of Systems  Constitutional: Negative for fever.  Respiratory: Negative for shortness of breath.   Skin: Positive for rash.       Pruritus        Past Medical History:  Diagnosis Date  . Abdominal pain, epigastric 12/20/2006  . ALLERGIC RHINITIS 12/25/2006  . Allergy   . ANXIETY 10/16/2006   no per pt  . Arthritis    right shoulder  . BACK PAIN 06/15/2008  . Blood transfusion without reported diagnosis   . BRADYCARDIA 05/04/2008  . Carpal tunnel syndrome 10/16/2006  . Cellulitis and abscess of other specified site 06/15/2008  . CHEST PAIN 09/08/2009  . Chronic low back pain   . CIRRHOSIS 10/16/2006  . CKD (chronic kidney disease)    2008- had Ecoli and caused renal failure, no problems since then  . CKD (chronic kidney disease) stage 3, GFR 30-59 ml/min 10/16/2006   Qualifier: Diagnosis of  By:  Jenny Reichmann MD, [place] 12/20/2006  . Complication of anesthesia    awake during 2 surgeries  . CONTUSION, LOWER LEG 09/15/2008  . DEPRESSION 10/16/2006   no per pt  . Esophageal reflux 12/25/2006  . HYPERLIPIDEMIA 12/25/2006  . Hyperlipidemia   . HYPERTENSION 08/15/2006  . Hypertension   . HYPOTHYROIDISM 08/15/2006  . Kidney failure, acute ([place]) 2009   as a result of severe E Coli infection  . Left-sided carotid artery disease ([place])   . MIGRAINE HEADACHE 10/16/2006  . Obesity 10/16/2006  . OVARIAN CYST 12/20/2006  . Psoriasis 01/03/2011  . SHINGLES, HX OF 12/20/2006  . Stroke [facility])    3 strokes 2008-no deficits, only on ASA  . TRANSIENT ISCHEMIC ATTACKS, HX OF 12/20/2006  . VERTIGO 09/15/2007  . VITAMIN B12 DEFICIENCY 01/09/2007     Social History   Social History  . Marital status: Married    Spouse name: N/A  . Number of children: N/A  . Years of education: N/A   Occupational History  . Not on file.   Social History Main Topics  . Smoking status: Former Research scientist (life sciences)  . Smokeless tobacco: Never Used     Comment: quit smoking over 30 years ago  . Alcohol use No  . Drug use: No  . Sexual activity: Not on file   Other Topics Concern  . Not on  file   Social History Narrative  . No narrative on file    Past Surgical History:  Procedure Laterality Date  . ABDOMINAL HYSTERECTOMY  02/2006  . ANTERIOR CERVICAL DECOMP/DISCECTOMY FUSION  2005  . CESAREAN SECTION    . CHOLECYSTECTOMY    . COLONOSCOPY    . ENDARTERECTOMY Left 08/25/2014   Procedure: LEFT CAROTID ENDARTERECTOMY WITH HEMASHIELD PATCH ANGIOPLASTY;  Surgeon: Elam Dutch, MD;  Location: [facility];  Service: Vascular;  Laterality: Left;  . NECK SURGERY  2005  . ROTATOR CUFF REPAIR Right 02/2014   Dr Veverly Fells    Family History  Problem Relation Age of Onset  . Stroke Father   . Heart disease Father   . Diabetes Father   . Lupus Mother   . Raynaud syndrome Mother   . Heart disease Mother   . Diabetes  Sister   . Heart disease Brother   . Hypertension Brother   . Heart disease Brother   . Hypertension Brother   . Diabetes Other   . Hypertension Other   . Colon cancer Neg Hx   . Esophageal cancer Neg Hx   . Rectal cancer Neg Hx   . Stomach cancer Neg Hx     Allergies  Allergen Reactions  . Cefuroxime Axetil     REACTION: edema/swelling  . Ciprofloxacin Swelling  . Lipitor [Atorvastatin] Other (See Comments)    Myalgia   . Lyrica [Pregabalin] Other (See Comments)    HA, confusion, swelling    Current Outpatient Prescriptions on File Prior to Visit  Medication Sig Dispense Refill  . acetaminophen (TYLENOL) 500 MG tablet Take 1,000 mg by mouth every 8 (eight) hours as needed (pain).    Marland Kitchen amLODipine (NORVASC) 5 MG tablet   5  . aspirin 325 MG EC tablet Take 325 mg by mouth daily.      Marland Kitchen azithromycin (ZITHROMAX Z-PAK) 250 MG tablet 2 tab by mouth on day 1, then 1 per day 6 tablet 1  . benzonatate (TESSALON) 100 MG capsule Take 1 capsule (100 mg total) by mouth 3 (three) times daily as needed for cough. 20 capsule 0  . calcium citrate-vitamin D 500-400 MG-UNIT chewable tablet Chew 2 tablets by mouth daily.    . Cholecalciferol (VITAMIN D3) 1000 units CAPS Take 2 capsules (2,000 Units total) by mouth daily. 60 capsule 0  . clobetasol (TEMOVATE) 0.05 % external solution   0  . fexofenadine (ALLEGRA) 180 MG tablet Take 1 tablet (180 mg total) by mouth every other day. 15 tablet 3  . FLUoxetine (PROZAC) 20 MG tablet Take 1 tablet (20 mg total) by mouth daily. 90 tablet 2  . fluticasone (FLONASE) 50 MCG/ACT nasal spray Place 2 sprays into both nostrils daily. 16 g 0  . gabapentin (NEURONTIN) 100 MG capsule Take 1 capsule (100 mg total) by mouth 3 (three) times daily. 90 capsule 3  . guaiFENesin-dextromethorphan (ROBITUSSIN DM) 100-10 MG/5ML syrup Take 10 mLs by mouth every 6 (six) hours as needed for cough. 240 mL 0  . HYDROcodone-homatropine (HYCODAN) 5-1.5 MG/5ML syrup Take 5 mLs by  mouth every 6 (six) hours as needed for cough. 180 mL 0  . levothyroxine (SYNTHROID, LEVOTHROID) 112 MCG tablet Take 1 tablet (112 mcg total) by mouth daily. 90 tablet 3  . lisinopril (PRINIVIL,ZESTRIL) 10 MG tablet Take 1 tablet (10 mg total) by mouth daily. 90 tablet 3  . lovastatin (MEVACOR) 40 MG tablet Take 1 tablet (40 mg total) by mouth at bedtime.  90 tablet 3  . omeprazole (PRILOSEC) 20 MG capsule Take 1 capsule (20 mg total) by mouth daily. 90 capsule 3  . oxymetazoline (AFRIN NASAL SPRAY) 0.05 % nasal spray Place 1 spray into both nostrils 2 (two) times daily. Use only for 3days, then stop 30 mL 0  . SUMAtriptan (IMITREX) 100 MG tablet TAKE 1 TABLET BY MOUTH EVERY 2 HOURS AS NEEDED FOR MIGRAINE OR HEADACHE.  MAY REPEAT IN 2 HOURS IF HEADACHE PERSISTS OR RECURS. 10 tablet 5   No current facility-administered medications on file prior to visit.     BP 116/78 (BP Location: Left Arm, Patient Position: Sitting, Cuff Size: Large)   Pulse 79   Temp 98.2 F (36.8 C) (Oral)   Wt 203 lb 8 oz (92.3 kg)   SpO2 98%   BMI 37.22 kg/m    Objective:   Physical Exam  Constitutional: She appears well-nourished.  Cardiovascular: Normal rate and regular rhythm.   Pulmonary/Chest: Effort normal and breath sounds normal. She has no wheezes.  Skin:  Mild rash to bilateral anterior forearms and posterior lower neck. Small, raised, papular rash, red.           Assessment & Plan:  Rash:  Located to bilateral anterior forearms and posterior neck. Overall continues to experience pruritus to hands and feet, now to extremities and neck. No respiratory symptoms. Discussed to stop Gabapentin incase of any potential reaction. Stop benadryl cream as this is not helping. Discussed she will need to speak with her PCP regarding further intervention for pruritus if no resolve with my treatment today. Rx for low dose prednisone taper provided given wide spread rash. Discussed to watch blood sugars due to  risk for hyperglycemia. She verbalized understanding.  Sheral Flow, NP

## 2016-02-11 NOTE — Progress Notes (Signed)
Pre visit review using our clinic review tool, if applicable. No additional management support is needed unless otherwise documented below in the visit note. 

## 2016-02-22 ENCOUNTER — Ambulatory Visit (INDEPENDENT_AMBULATORY_CARE_PROVIDER_SITE_OTHER): Payer: BLUE CROSS/BLUE SHIELD | Admitting: Internal Medicine

## 2016-02-22 ENCOUNTER — Ambulatory Visit (HOSPITAL_COMMUNITY)
Admission: RE | Admit: 2016-02-22 | Discharge: 2016-02-22 | Disposition: A | Discharge status: Home / Self Care | Payer: BLUE CROSS/BLUE SHIELD | Source: Ambulatory Visit | Attending: Cardiology | Admitting: Cardiology

## 2016-02-22 ENCOUNTER — Other Ambulatory Visit: Payer: Self-pay

## 2016-02-22 VITALS — BP 138/78 | HR 80 | Temp 98.0°F | Resp 20 | Wt 202.0 lb

## 2016-02-22 DIAGNOSIS — M79661 Pain in right lower leg: Secondary | ICD-10-CM | POA: Insufficient documentation

## 2016-02-22 DIAGNOSIS — N183 Chronic kidney disease, stage 3 unspecified: Secondary | ICD-10-CM

## 2016-02-22 DIAGNOSIS — M79672 Pain in left foot: Secondary | ICD-10-CM

## 2016-02-22 DIAGNOSIS — M7989 Other specified soft tissue disorders: Secondary | ICD-10-CM

## 2016-02-22 DIAGNOSIS — I10 Essential (primary) hypertension: Secondary | ICD-10-CM

## 2016-02-22 MED ORDER — TRAMADOL HCL 50 MG PO TABS: 50.0000 mg | ORAL_TABLET | Freq: Three times a day (TID) | ORAL | 1 refills | Status: DC | PRN

## 2016-02-22 NOTE — Progress Notes (Signed)
Subjective:    Patient ID: Heather Dawson, female    DOB: Apr 12, 1955, 61 y.o.   MRN: [id number]  HPI  Here with 2 wks onset constant, mild to mod, left midfoot pain mostly to the instep with walking, better to sit. No swelling or falls or other trauma.  Also in the past wk with rather large dramatic onset swelling to right leg below the knee but not really swelling to the foot.  Pt denies chest pain, increased sob or doe, wheezing, orthopnea, PND, palpitations, dizziness or syncope.  Pt denies new neurological symptoms such as new headache, or facial or extremity weakness or numbness    Pt denies polydipsia, polyuria,  Rash and itch resolved from last visit. Past Medical History:  Diagnosis Date  . Abdominal pain, epigastric 12/20/2006  . ALLERGIC RHINITIS 12/25/2006  . Allergy   . ANXIETY 10/16/2006   no per pt  . Arthritis    right shoulder  . BACK PAIN 06/15/2008  . Blood transfusion without reported diagnosis   . BRADYCARDIA 05/04/2008  . Carpal tunnel syndrome 10/16/2006  . Cellulitis and abscess of other specified site 06/15/2008  . CHEST PAIN 09/08/2009  . Chronic low back pain   . CIRRHOSIS 10/16/2006  . CKD (chronic kidney disease)    2008- had Ecoli and caused renal failure, no problems since then  . CKD (chronic kidney disease) stage 3, GFR 30-59 ml/min 10/16/2006   Qualifier: Diagnosis of  By: Jenny Reichmann MD, [place] 12/20/2006  . Complication of anesthesia    awake during 2 surgeries  . CONTUSION, LOWER LEG 09/15/2008  . DEPRESSION 10/16/2006   no per pt  . Esophageal reflux 12/25/2006  . HYPERLIPIDEMIA 12/25/2006  . Hyperlipidemia   . HYPERTENSION 08/15/2006  . Hypertension   . HYPOTHYROIDISM 08/15/2006  . Kidney failure, acute ([place]) 2009   as a result of severe E Coli infection  . Left-sided carotid artery disease ([place])   . MIGRAINE HEADACHE 10/16/2006  . Obesity 10/16/2006  . OVARIAN CYST 12/20/2006  . Psoriasis 01/03/2011  . SHINGLES, HX OF 12/20/2006  .  Stroke [facility])    3 strokes 2008-no deficits, only on ASA  . TRANSIENT ISCHEMIC ATTACKS, HX OF 12/20/2006  . VERTIGO 09/15/2007  . VITAMIN B12 DEFICIENCY 01/09/2007   Past Surgical History:  Procedure Laterality Date  . ABDOMINAL HYSTERECTOMY  02/2006  . ANTERIOR CERVICAL DECOMP/DISCECTOMY FUSION  2005  . CESAREAN SECTION    . CHOLECYSTECTOMY    . COLONOSCOPY    . ENDARTERECTOMY Left 08/25/2014   Procedure: LEFT CAROTID ENDARTERECTOMY WITH HEMASHIELD PATCH ANGIOPLASTY;  Surgeon: Elam Dutch, MD;  Location: [facility];  Service: Vascular;  Laterality: Left;  . NECK SURGERY  2005  . ROTATOR CUFF REPAIR Right 02/2014   Dr Veverly Fells    reports that she has quit smoking. She has never used smokeless tobacco. She reports that she does not drink alcohol or use drugs. family history includes Diabetes in her father, other, and sister; Heart disease in her brother, brother, father, and mother; Hypertension in her brother, brother, and other; Lupus in her mother; Raynaud syndrome in her mother; Stroke in her father. Allergies  Allergen Reactions  . Cefuroxime Axetil     REACTION: edema/swelling  . Ciprofloxacin Swelling  . Lipitor [Atorvastatin] Other (See Comments)    Myalgia   . Lyrica [Pregabalin] Other (See Comments)    HA, confusion, swelling  . Gabapentin Rash   Current Outpatient Prescriptions  on File Prior to Visit  Medication Sig Dispense Refill  . acetaminophen (TYLENOL) 500 MG tablet Take 1,000 mg by mouth every 8 (eight) hours as needed (pain).    Marland Kitchen amLODipine (NORVASC) 5 MG tablet   5  . aspirin 325 MG EC tablet Take 325 mg by mouth daily.      . calcium citrate-vitamin D 500-400 MG-UNIT chewable tablet Chew 2 tablets by mouth daily.    . Cholecalciferol (VITAMIN D3) 1000 units CAPS Take 2 capsules (2,000 Units total) by mouth daily. 60 capsule 0  . clobetasol (TEMOVATE) 0.05 % external solution   0  . fexofenadine (ALLEGRA) 180 MG tablet Take 1 tablet (180 mg total) by mouth every  other day. 15 tablet 3  . FLUoxetine (PROZAC) 20 MG tablet Take 1 tablet (20 mg total) by mouth daily. 90 tablet 2  . levothyroxine (SYNTHROID, LEVOTHROID) 112 MCG tablet Take 1 tablet (112 mcg total) by mouth daily. 90 tablet 3  . lisinopril (PRINIVIL,ZESTRIL) 10 MG tablet Take 1 tablet (10 mg total) by mouth daily. 90 tablet 3  . lovastatin (MEVACOR) 40 MG tablet Take 1 tablet (40 mg total) by mouth at bedtime. 90 tablet 3  . omeprazole (PRILOSEC) 20 MG capsule Take 1 capsule (20 mg total) by mouth daily. 90 capsule 3  . oxymetazoline (AFRIN NASAL SPRAY) 0.05 % nasal spray Place 1 spray into both nostrils 2 (two) times daily. Use only for 3days, then stop 30 mL 0  . SUMAtriptan (IMITREX) 100 MG tablet TAKE 1 TABLET BY MOUTH EVERY 2 HOURS AS NEEDED FOR MIGRAINE OR HEADACHE.  MAY REPEAT IN 2 HOURS IF HEADACHE PERSISTS OR RECURS. 10 tablet 5   No current facility-administered medications on file prior to visit.    Review of Systems  Constitutional: Negative for unusual diaphoresis or night sweats HENT: Negative for ear swelling or discharge Eyes: Negative for worsening visual haziness  Respiratory: Negative for choking and stridor.   Gastrointestinal: Negative for distension or worsening eructation Genitourinary: Negative for retention or change in urine volume.  Musculoskeletal: Negative for other MSK pain or swelling Skin: Negative for color change and worsening wound Neurological: Negative for tremors and numbness other than noted  Psychiatric/Behavioral: Negative for decreased concentration or agitation other than above   All other system neg per pt    Objective:   Physical Exam BP 138/78   Pulse 80   Temp 98 F (36.7 C) (Oral)   Resp 20   Wt 202 lb (91.6 kg)   SpO2 97%   BMI 36.95 kg/m  VS noted,  Constitutional: Pt appears in no apparent distress HENT: Head: NCAT.  Right Ear: External ear normal.  Left Ear: External ear normal.  Eyes: . Pupils are equal, round, and  reactive to light. Conjunctivae and EOM are normal Neck: Normal range of motion. Neck supple.  Cardiovascular: Normal rate and regular rhythm.   Pulmonary/Chest: Effort normal and breath sounds without rales or wheezing.  Neurological: Pt is alert. Not confused , motor grossly intact Skin: Skin is warm. No rash, no LE edema Psychiatric: Pt behavior is normal. No agitation.  Right leg with 2-3+ tender swelling to the right leg (and somewhat the ankle) but without foot swelling, with most tenderness to the post calf area, skin with slight erythema without ulcer or drainage, dorsalis pedis 1+ bilateral Left foot without significant tender, swelling, mas or skin change    Assessment & Plan:

## 2016-02-22 NOTE — Patient Instructions (Addendum)
Please take all new medication as prescribed - the pain medication  Please continue all other medications as before, and refills have been done if requested.  Please have the pharmacy call with any other refills you may need.  Please keep your appointments with your specialists as you may have planned  You will be contacted regarding the referral for: right leg venous doppler test (to see [facility] now)  You will be contacted regarding the referral for: Dr Smith/sports medicine in this office  Please return in 3 months, or sooner if needed, with Lab testing done 3-5 days before

## 2016-02-22 NOTE — Progress Notes (Signed)
Pre visit review using our clinic review tool, if applicable. No additional management support is needed unless otherwise documented below in the visit note. 

## 2016-02-26 NOTE — Assessment & Plan Note (Signed)
stable overall by history and exam, recent data reviewed with pt, and pt to continue medical treatment as before,  to f/u any worsening symptoms or concerns Lab Results  Component Value Date   CREATININE 1.47 (H) 12/20/2015

## 2016-02-26 NOTE — Assessment & Plan Note (Signed)
Exam benign ? Plantar fasciitis, for sport med referral

## 2016-02-26 NOTE — Assessment & Plan Note (Addendum)
Rather dramatic pain and swelling essentially limited to leg below the knee but not ankle or foot; diff includes bakers cyst vs gastroc tear, for pain control, refer sport med referral, also for RLE venous doppler - r/o dvt, to f/u any worsening symptoms or concerns

## 2016-02-26 NOTE — Assessment & Plan Note (Signed)
stable overall by history and exam, recent data reviewed with pt, and pt to continue medical treatment as before,  to f/u any worsening symptoms or concerns BP Readings from Last 3 Encounters:  02/22/16 138/78  02/11/16 116/78  02/01/16 140/80

## 2016-03-09 ENCOUNTER — Ambulatory Visit: Payer: BLUE CROSS/BLUE SHIELD | Admitting: Family Medicine

## 2016-03-28 ENCOUNTER — Encounter: Payer: Self-pay | Admitting: Cardiology

## 2016-05-03 ENCOUNTER — Encounter: Payer: Self-pay | Admitting: Internal Medicine

## 2016-05-03 ENCOUNTER — Ambulatory Visit (INDEPENDENT_AMBULATORY_CARE_PROVIDER_SITE_OTHER): Payer: BLUE CROSS/BLUE SHIELD | Admitting: Internal Medicine

## 2016-05-03 VITALS — BP 162/110 | HR 60 | Temp 97.6°F | Ht 62.0 in | Wt 209.0 lb

## 2016-05-03 DIAGNOSIS — Z23 Encounter for immunization: Secondary | ICD-10-CM

## 2016-05-03 DIAGNOSIS — N183 Chronic kidney disease, stage 3 unspecified: Secondary | ICD-10-CM

## 2016-05-03 DIAGNOSIS — E119 Type 2 diabetes mellitus without complications: Secondary | ICD-10-CM | POA: Diagnosis not present

## 2016-05-03 DIAGNOSIS — Z0000 Encounter for general adult medical examination without abnormal findings: Secondary | ICD-10-CM | POA: Diagnosis not present

## 2016-05-03 DIAGNOSIS — I10 Essential (primary) hypertension: Secondary | ICD-10-CM

## 2016-05-03 NOTE — Assessment & Plan Note (Signed)

## 2016-05-03 NOTE — Patient Instructions (Signed)
You had the Prevnar 13 pneumonia shot today  Your A1c was OK today   Please continue all other medications as before, and refills have been done if requested.  Please have the pharmacy call with any other refills you may need.  Please continue your efforts at being more active, low cholesterol diet, and weight control.  You are otherwise up to date with prevention measures today.  Please keep your appointments with your specialists as you may have planned  No further lab work needed today  Please return in 6 months, or sooner if needed, with Lab testing done 3-5 days before

## 2016-05-03 NOTE — Assessment & Plan Note (Signed)
a1c is adequate, to cont current med tx

## 2016-05-03 NOTE — Assessment & Plan Note (Signed)
Had f/u lab last wk, pt states has not heard results yet but will call

## 2016-05-03 NOTE — Addendum Note (Signed)
Addended by: Ander Slade on: 05/03/2016 06:39 PM   Modules accepted: Orders

## 2016-05-03 NOTE — Assessment & Plan Note (Signed)
I suspect related to CKD and worsening obesity , to cont to monitor and fu with Dr Servando Salina as planned with phone call next wk regarding BP and need for further increased tx

## 2016-05-03 NOTE — Progress Notes (Signed)
Subjective:    Patient ID: Heather Dawson, female    DOB: 1955-11-08, 61 y.o.   MRN: [id number]  HPI    Here for wellness and f/u;  Overall doing ok;  Pt denies Chest pain, worsening SOB, DOE, wheezing, orthopnea, PND, worsening LE edema, palpitations, dizziness or syncope.  Pt denies neurological change such as new headache, facial or extremity weakness.  Pt denies polydipsia, polyuria, or low sugar symptoms. Pt states overall good compliance with treatment and medications, good tolerability, and has been trying to follow appropriate diet.  Pt denies worsening depressive symptoms, suicidal ideation or panic. No fever, night sweats, wt loss, loss of appetite, or other constitutional symptoms.  Pt states good ability with ADL's, has low fall risk, home safety reviewed and adequate, no other significant changes in hearing or vision. Had renal labs done 3/8 and does not want further tests today.  Was also to check her BP twice per day and call after 2 wks if > 140/90, and likely to need ? More amlodipine.   BP has increased with wt. , hard to lose and keep off.  Remains disabled from work per ortho, still applying for disability, finances very tight.  Has chronic right shoudler and right SI joint pain Wt Readings from Last 3 Encounters:  05/03/16 209 lb (94.8 kg)  02/22/16 202 lb (91.6 kg)  02/11/16 203 lb 8 oz (92.3 kg)   BP Readings from Last 3 Encounters:  05/03/16 (!) 162/110  02/22/16 138/78  02/11/16 116/78   Past Medical History:  Diagnosis Date  . Abdominal pain, epigastric 12/20/2006  . ALLERGIC RHINITIS 12/25/2006  . Allergy   . ANXIETY 10/16/2006   no per pt  . Arthritis    right shoulder  . BACK PAIN 06/15/2008  . Blood transfusion without reported diagnosis   . BRADYCARDIA 05/04/2008  . Carpal tunnel syndrome 10/16/2006  . Cellulitis and abscess of other specified site 06/15/2008  . CHEST PAIN 09/08/2009  . Chronic low back pain   . CIRRHOSIS 10/16/2006  . CKD (chronic kidney  disease)    2008- had Ecoli and caused renal failure, no problems since then  . CKD (chronic kidney disease) stage 3, GFR 30-59 ml/min 10/16/2006   Qualifier: Diagnosis of  By: Jenny Reichmann MD, [place] 12/20/2006  . Complication of anesthesia    awake during 2 surgeries  . CONTUSION, LOWER LEG 09/15/2008  . DEPRESSION 10/16/2006   no per pt  . Esophageal reflux 12/25/2006  . HYPERLIPIDEMIA 12/25/2006  . Hyperlipidemia   . HYPERTENSION 08/15/2006  . Hypertension   . HYPOTHYROIDISM 08/15/2006  . Kidney failure, acute ([place]) 2009   as a result of severe E Coli infection  . Left-sided carotid artery disease ([place])   . MIGRAINE HEADACHE 10/16/2006  . Obesity 10/16/2006  . OVARIAN CYST 12/20/2006  . Psoriasis 01/03/2011  . SHINGLES, HX OF 12/20/2006  . Stroke [facility])    3 strokes 2008-no deficits, only on ASA  . TRANSIENT ISCHEMIC ATTACKS, HX OF 12/20/2006  . VERTIGO 09/15/2007  . VITAMIN B12 DEFICIENCY 01/09/2007   Past Surgical History:  Procedure Laterality Date  . ABDOMINAL HYSTERECTOMY  02/2006  . ANTERIOR CERVICAL DECOMP/DISCECTOMY FUSION  2005  . CESAREAN SECTION    . CHOLECYSTECTOMY    . COLONOSCOPY    . ENDARTERECTOMY Left 08/25/2014   Procedure: LEFT CAROTID ENDARTERECTOMY WITH HEMASHIELD PATCH ANGIOPLASTY;  Surgeon: Elam Dutch, MD;  Location: [facility];  Service: Vascular;  Laterality: Left;  . NECK SURGERY  2005  . ROTATOR CUFF REPAIR Right 02/2014   Dr Veverly Fells    reports that she has quit smoking. She has never used smokeless tobacco. She reports that she does not drink alcohol or use drugs. family history includes Diabetes in her father, other, and sister; Heart disease in her brother, brother, father, and mother; Hypertension in her brother, brother, and other; Lupus in her mother; Raynaud syndrome in her mother; Stroke in her father. Allergies  Allergen Reactions  . Cefuroxime Axetil     REACTION: edema/swelling  . Ciprofloxacin Swelling  . Lipitor  [Atorvastatin] Other (See Comments)    Myalgia   . Lyrica [Pregabalin] Other (See Comments)    HA, confusion, swelling  . Gabapentin Rash   Current Outpatient Prescriptions on File Prior to Visit  Medication Sig Dispense Refill  . acetaminophen (TYLENOL) 500 MG tablet Take 1,000 mg by mouth every 8 (eight) hours as needed (pain).    Marland Kitchen amLODipine (NORVASC) 5 MG tablet   5  . aspirin 325 MG EC tablet Take 325 mg by mouth daily.      . calcium citrate-vitamin D 500-400 MG-UNIT chewable tablet Chew 2 tablets by mouth daily.    . Cholecalciferol (VITAMIN D3) 1000 units CAPS Take 2 capsules (2,000 Units total) by mouth daily. 60 capsule 0  . clobetasol (TEMOVATE) 0.05 % external solution   0  . fexofenadine (ALLEGRA) 180 MG tablet Take 1 tablet (180 mg total) by mouth every other day. 15 tablet 3  . FLUoxetine (PROZAC) 20 MG tablet Take 1 tablet (20 mg total) by mouth daily. 90 tablet 2  . levothyroxine (SYNTHROID, LEVOTHROID) 112 MCG tablet Take 1 tablet (112 mcg total) by mouth daily. 90 tablet 3  . lisinopril (PRINIVIL,ZESTRIL) 10 MG tablet Take 1 tablet (10 mg total) by mouth daily. 90 tablet 3  . lovastatin (MEVACOR) 40 MG tablet Take 1 tablet (40 mg total) by mouth at bedtime. 90 tablet 3  . omeprazole (PRILOSEC) 20 MG capsule Take 1 capsule (20 mg total) by mouth daily. 90 capsule 3  . SUMAtriptan (IMITREX) 100 MG tablet TAKE 1 TABLET BY MOUTH EVERY 2 HOURS AS NEEDED FOR MIGRAINE OR HEADACHE.  MAY REPEAT IN 2 HOURS IF HEADACHE PERSISTS OR RECURS. 10 tablet 5  . traMADol (ULTRAM) 50 MG tablet Take 1 tablet (50 mg total) by mouth every 8 (eight) hours as needed. 60 tablet 1   No current facility-administered medications on file prior to visit.    Review of Systems Constitutional: Negative for increased diaphoresis, or other activity, appetite or siginficant weight change other than noted HENT: Negative for worsening hearing loss, ear pain, facial swelling, mouth sores and neck stiffness.     Eyes: Negative for other worsening pain, redness or visual disturbance.  Respiratory: Negative for choking or stridor Cardiovascular: Negative for other chest pain and palpitations.  Gastrointestinal: Negative for worsening diarrhea, blood in stool, or abdominal distention Genitourinary: Negative for hematuria, flank pain or change in urine volume.  Musculoskeletal: Negative for myalgias or other joint complaints.  Skin: Negative for other color change and wound or drainage.  Neurological: Negative for syncope and numbness. other than noted Hematological: Negative for adenopathy. or other swelling Psychiatric/Behavioral: Negative for hallucinations, SI, self-injury, decreased concentration or other worsening agitation.  All other system neg     Objective:   Physical Exam BP (!) 162/110   Pulse 60   Temp 97.6 F (36.4 C)   Ht  5\' 2"  (1.575 m)   Wt 209 lb (94.8 kg)   SpO2 98%   BMI 38.23 kg/m  VS noted, severe to morbid obese Constitutional: Pt is oriented to person, place, and time. Appears well-developed and well-nourished, in no significant distress Head: Normocephalic and atraumatic  Eyes: Conjunctivae and EOM are normal. Pupils are equal, round, and reactive to light Right Ear: External ear normal.  Left Ear: External ear normal Nose: Nose normal.  Mouth/Throat: Oropharynx is clear and moist  Neck: Normal range of motion. Neck supple. No JVD present. No tracheal deviation present or significant neck LA or mass Cardiovascular: Normal rate, regular rhythm, normal heart sounds and intact distal pulses.   Pulmonary/Chest: Effort normal and breath sounds without rales or wheezing  Abdominal: Soft. Bowel sounds are normal. NT. No HSM  Musculoskeletal: Normal range of motion. Exhibits trace ankle bilat edema Lymphadenopathy: Has no cervical adenopathy.  Neurological: Pt is alert and oriented to person, place, and time. Pt has normal reflexes. No cranial nerve deficit. Motor grossly  intact Skin: Skin is warm and dry. No rash noted or new ulcers Psychiatric:  Has normal mood and affect. Behavior is normal.  No other exam findings  Lab Results  Component Value Date   WBC 7.9 04/27/2015   HGB 14.7 04/27/2015   HCT 43.1 04/27/2015   PLT 210.0 04/27/2015   GLUCOSE 115 (H) 12/20/2015   CHOL 177 12/20/2015   TRIG 203.0 (H) 12/20/2015   HDL 43.60 12/20/2015   LDLDIRECT 109.0 12/20/2015   LDLCALC 131 (H) 04/27/2015   ALT 18 12/20/2015   AST 15 12/20/2015   NA 141 12/20/2015   K 4.5 12/20/2015   CL 106 12/20/2015   CREATININE 1.47 (H) 12/20/2015   BUN 24 (H) 12/20/2015   CO2 27 12/20/2015   TSH 0.35 04/27/2015   INR 0.99 08/17/2014   HGBA1C 6.0 12/20/2015   MICROALBUR 2.5 (H) 04/27/2015    POCT  a1c 6.7    Assessment & Plan:

## 2016-05-04 ENCOUNTER — Other Ambulatory Visit: Payer: Self-pay

## 2016-05-04 ENCOUNTER — Telehealth: Payer: Self-pay | Admitting: Internal Medicine

## 2016-05-04 MED ORDER — LISINOPRIL 10 MG PO TABS: 10.0000 mg | ORAL_TABLET | Freq: Every day | ORAL | 3 refills | Status: DC

## 2016-05-04 MED ORDER — LEVOTHYROXINE SODIUM 112 MCG PO TABS: 112.0000 ug | ORAL_TABLET | Freq: Every day | ORAL | 3 refills | Status: DC

## 2016-05-04 MED ORDER — AMLODIPINE BESYLATE 5 MG PO TABS
5.0000 mg | ORAL_TABLET | Freq: Every day | ORAL | 3 refills | Status: DC
Start: 2016-05-04 — End: 2016-09-19

## 2016-05-04 MED ORDER — OMEPRAZOLE 20 MG PO CPDR: 20.0000 mg | DELAYED_RELEASE_CAPSULE | Freq: Every day | ORAL | 3 refills | Status: DC

## 2016-05-04 MED ORDER — SUMATRIPTAN SUCCINATE 100 MG PO TABS: ORAL_TABLET | ORAL | 5 refills | Status: DC

## 2016-05-04 MED ORDER — FEXOFENADINE HCL 180 MG PO TABS: 180.0000 mg | ORAL_TABLET | ORAL | 3 refills | Status: DC

## 2016-05-04 MED ORDER — FLUOXETINE HCL 20 MG PO TABS: 20.0000 mg | ORAL_TABLET | Freq: Every day | ORAL | 3 refills | Status: DC

## 2016-05-04 MED ORDER — LOVASTATIN 40 MG PO TABS: 40.0000 mg | ORAL_TABLET | Freq: Every day | ORAL | 3 refills | Status: DC

## 2016-05-04 NOTE — Telephone Encounter (Signed)
Advised patient, not sure why rx was not sent for both meds, I have sent rx to pharm

## 2016-05-04 NOTE — Telephone Encounter (Signed)
Notified patient.

## 2016-05-04 NOTE — Telephone Encounter (Signed)
Patient states she came in to see Dr. Jenny Reichmann yesterday and he was to refill all her medications.  States pharmacy has not received anything.  States she ran out of her thyroid medication and lisinopril yesterday.  Is requesting medications to be sent today.  Patient uses [facility] in [place].  Patient states she needs medication today.

## 2016-05-04 NOTE — Telephone Encounter (Signed)
Should be all refilled now, thanks

## 2016-09-19 ENCOUNTER — Ambulatory Visit: Payer: Medicare Other | Admitting: Internal Medicine

## 2016-09-19 ENCOUNTER — Encounter: Payer: Self-pay | Admitting: Internal Medicine

## 2016-09-19 VITALS — BP 146/98 | HR 77 | Ht 62.0 in | Wt 209.0 lb

## 2016-09-19 DIAGNOSIS — M19042 Primary osteoarthritis, left hand: Secondary | ICD-10-CM

## 2016-09-19 DIAGNOSIS — M19041 Primary osteoarthritis, right hand: Secondary | ICD-10-CM | POA: Diagnosis not present

## 2016-09-19 DIAGNOSIS — M21611 Bunion of right foot: Secondary | ICD-10-CM | POA: Diagnosis not present

## 2016-09-19 DIAGNOSIS — M542 Cervicalgia: Secondary | ICD-10-CM | POA: Diagnosis not present

## 2016-09-19 DIAGNOSIS — I10 Essential (primary) hypertension: Secondary | ICD-10-CM

## 2016-09-19 DIAGNOSIS — M65332 Trigger finger, left middle finger: Secondary | ICD-10-CM

## 2016-09-19 LAB — POCT GLYCOSYLATED HEMOGLOBIN (HGB A1C): Hemoglobin A1C: 7

## 2016-09-19 MED ORDER — AMLODIPINE BESYLATE 10 MG PO TABS: 10.0000 mg | ORAL_TABLET | Freq: Every day | ORAL | 3 refills | Status: DC

## 2016-09-19 MED ORDER — DICLOFENAC SODIUM 1 % TD GEL: 4.0000 g | Freq: Four times a day (QID) | TRANSDERMAL | 5 refills | Status: DC | PRN

## 2016-09-19 MED ORDER — GABAPENTIN 300 MG PO CAPS: 300.0000 mg | ORAL_CAPSULE | Freq: Three times a day (TID) | ORAL | 5 refills | Status: DC

## 2016-09-19 MED ORDER — LISINOPRIL 20 MG PO TABS: 20.0000 mg | ORAL_TABLET | Freq: Every day | ORAL | 3 refills | Status: DC

## 2016-09-19 MED ORDER — GABAPENTIN 100 MG PO CAPS: 100.0000 mg | ORAL_CAPSULE | Freq: Three times a day (TID) | ORAL | 0 refills | Status: DC

## 2016-09-19 NOTE — Patient Instructions (Addendum)
Your A1c was OK today  Please take all new medication as prescribed - the voltaren gel for pain  OK to increase the amlodipine to 10 mg per day  Please continue all other medications as before, and refills have been done if requested.  Please have the pharmacy call with any other refills you may need.  Please continue your efforts at being more active, low cholesterol diet, and weight control.  You will be contacted regarding the referral for: Hand Surgury, and [facility]  Please keep your appointments with your specialists as you may have planned - Renal

## 2016-09-19 NOTE — Progress Notes (Signed)
Subjective:    Patient ID: Heather Dawson, female    DOB: December 20, 1955, 61 y.o.   MRN: [id number]  HPI  Here to f/u; overall doing ok,  Pt denies chest pain, increasing sob or doe, wheezing, orthopnea, PND, increased LE swelling, palpitations, dizziness or syncope.  Pt denies new neurological symptoms such as new headache, or facial or extremity weakness or numbness.  Pt denies polydipsia, polyuria, Has chronic pain - back , right hip - SI joint surgury not approved by insurance , right shoulder post surgury;  Has persistent bilat hand OA pain mostly to the fingers, also with uncontrolled left cervical radiculitis like pain chronic for  Several months. Also 3rd finger left hand appears to be trigger like in that she can wake in the AM with a bent finger she has to manually distend.  Also has right bunion with wrose pain in the past mnonth, sharp, intermittent, without radiation but worse to walk, better to sit BP Readings from Last 3 Encounters:  09/19/16 (!) 146/98  05/03/16 (!) 162/110  02/22/16 138/78   Wt Readings from Last 3 Encounters:  09/19/16 209 lb (94.8 kg)  05/03/16 209 lb (94.8 kg)  02/22/16 202 lb (91.6 kg)  Lisinopril increased to 20 in mar 2018 per renal/dr colodonado for better BP control.;  Plan was to incresase the amlodipine if still not controlled, has f/u appt at 6 mo Past Medical History:  Diagnosis Date  . Abdominal pain, epigastric 12/20/2006  . ALLERGIC RHINITIS 12/25/2006  . Allergy   . ANXIETY 10/16/2006   no per pt  . Arthritis    right shoulder  . BACK PAIN 06/15/2008  . Blood transfusion without reported diagnosis   . BRADYCARDIA 05/04/2008  . Carpal tunnel syndrome 10/16/2006  . Cellulitis and abscess of other specified site 06/15/2008  . CHEST PAIN 09/08/2009  . Chronic low back pain   . CIRRHOSIS 10/16/2006  . CKD (chronic kidney disease)    2008- had Ecoli and caused renal failure, no problems since then  . CKD (chronic kidney disease) stage 3, GFR 30-59  ml/min 10/16/2006   Qualifier: Diagnosis of  By: Jenny Reichmann MD, [place] 12/20/2006  . Complication of anesthesia    awake during 2 surgeries  . CONTUSION, LOWER LEG 09/15/2008  . DEPRESSION 10/16/2006   no per pt  . Esophageal reflux 12/25/2006  . HYPERLIPIDEMIA 12/25/2006  . Hyperlipidemia   . HYPERTENSION 08/15/2006  . Hypertension   . HYPOTHYROIDISM 08/15/2006  . Kidney failure, acute ([place]) 2009   as a result of severe E Coli infection  . Left-sided carotid artery disease ([place])   . MIGRAINE HEADACHE 10/16/2006  . Obesity 10/16/2006  . OVARIAN CYST 12/20/2006  . Psoriasis 01/03/2011  . SHINGLES, HX OF 12/20/2006  . Stroke [facility])    3 strokes 2008-no deficits, only on ASA  . TRANSIENT ISCHEMIC ATTACKS, HX OF 12/20/2006  . VERTIGO 09/15/2007  . VITAMIN B12 DEFICIENCY 01/09/2007   Past Surgical History:  Procedure Laterality Date  . ABDOMINAL HYSTERECTOMY  02/2006  . ANTERIOR CERVICAL DECOMP/DISCECTOMY FUSION  2005  . CESAREAN SECTION    . CHOLECYSTECTOMY    . COLONOSCOPY    . ENDARTERECTOMY Left 08/25/2014   Procedure: LEFT CAROTID ENDARTERECTOMY WITH HEMASHIELD PATCH ANGIOPLASTY;  Surgeon: Elam Dutch, MD;  Location: [facility];  Service: Vascular;  Laterality: Left;  . NECK SURGERY  2005  . ROTATOR CUFF REPAIR Right 02/2014   Dr Veverly Fells  reports that she has quit smoking. She has never used smokeless tobacco. She reports that she does not drink alcohol or use drugs. family history includes Diabetes in her father, other, and sister; Heart disease in her brother, brother, father, and mother; Hypertension in her brother, brother, and other; Lupus in her mother; Raynaud syndrome in her mother; Stroke in her father. Allergies  Allergen Reactions  . Cefuroxime Axetil     REACTION: edema/swelling  . Ciprofloxacin Swelling  . Lipitor [Atorvastatin] Other (See Comments)    Myalgia   . Lyrica [Pregabalin] Other (See Comments)    HA, confusion, swelling  . Gabapentin Rash     Current Outpatient Prescriptions on File Prior to Visit  Medication Sig Dispense Refill  . acetaminophen (TYLENOL) 500 MG tablet Take 1,000 mg by mouth every 8 (eight) hours as needed (pain).    Marland Kitchen aspirin 325 MG EC tablet Take 325 mg by mouth daily.      . calcium citrate-vitamin D 500-400 MG-UNIT chewable tablet Chew 2 tablets by mouth daily.    . Cholecalciferol (VITAMIN D3) 1000 units CAPS Take 2 capsules (2,000 Units total) by mouth daily. 60 capsule 0  . clobetasol (TEMOVATE) 0.05 % external solution   0  . fexofenadine (ALLEGRA) 180 MG tablet Take 1 tablet (180 mg total) by mouth every other day. 45 tablet 3  . FLUoxetine (PROZAC) 20 MG tablet Take 1 tablet (20 mg total) by mouth daily. 90 tablet 3  . levothyroxine (SYNTHROID, LEVOTHROID) 112 MCG tablet Take 1 tablet (112 mcg total) by mouth daily. 90 tablet 3  . lovastatin (MEVACOR) 40 MG tablet Take 1 tablet (40 mg total) by mouth at bedtime. 90 tablet 3  . omeprazole (PRILOSEC) 20 MG capsule Take 1 capsule (20 mg total) by mouth daily. 90 capsule 3  . SUMAtriptan (IMITREX) 100 MG tablet TAKE 1 TABLET BY MOUTH EVERY 2 HOURS AS NEEDED FOR MIGRAINE OR HEADACHE.  MAY REPEAT IN 2 HOURS IF HEADACHE PERSISTS OR RECURS. 10 tablet 5   No current facility-administered medications on file prior to visit.    Review of Systems  Constitutional: Negative for other unusual diaphoresis or sweats HENT: Negative for ear discharge or swelling Eyes: Negative for other worsening visual disturbances Respiratory: Negative for stridor or other swelling  Gastrointestinal: Negative for worsening distension or other blood Genitourinary: Negative for retention or other urinary change Musculoskeletal: Negative for other MSK pain or swelling Skin: Negative for color change or other new lesions Neurological: Negative for worsening tremors and other numbness  Psychiatric/Behavioral: Negative for worsening agitation or other fatigue All other system neg per  pt    Objective:   Physical Exam BP (!) 146/98   Pulse 77   Ht 5\' 2"  (1.575 m)   Wt 209 lb (94.8 kg)   SpO2 99%   BMI 38.23 kg/m  VS noted, not ill appearing Constitutional: Pt appears in NAD HENT: Head: NCAT.  Right Ear: External ear normal.  Left Ear: External ear normal.  Eyes: . Pupils are equal, round, and reactive to light. Conjunctivae and EOM are normal Nose: without d/c or deformity Neck: Neck supple. Gross normal ROM Cardiovascular: Normal rate and regular rhythm.   Pulmonary/Chest: Effort normal and breath sounds without rales or wheezing.  Abd:  Soft, NT, ND, + BS, no organomegaly MSK: cervical spine with bilat paravertebral tender; has bilat hand OA changes with bony degenerative changes; 3rd finger left hand + trigger like with "popping" om distension at the PIP,  also with right first MTP with 1-2+ bunion like tender swelling  Neurological: Pt is alert. At baseline orientation, motor grossly intact Skin: Skin is warm. No rashes, other new lesions, no LE edema Psychiatric: Pt behavior is normal without agitation    POCT glycosylated hemoglobin (Hb A1C)  Order: [id number]  Status:  Final result Visible to patient:  No (Not Released) Dx:  Essential hypertension  Component 11:52  Hemoglobin A1C 7.0        Lab Results  Component Value Date   WBC 7.9 04/27/2015   HGB 14.7 04/27/2015   HCT 43.1 04/27/2015   PLT 210.0 04/27/2015   GLUCOSE 115 (H) 12/20/2015   CHOL 177 12/20/2015   TRIG 203.0 (H) 12/20/2015   HDL 43.60 12/20/2015   LDLDIRECT 109.0 12/20/2015   LDLCALC 131 (H) 04/27/2015   ALT 18 12/20/2015   AST 15 12/20/2015   NA 141 12/20/2015   K 4.5 12/20/2015   CL 106 12/20/2015   CREATININE 1.47 (H) 12/20/2015   BUN 24 (H) 12/20/2015   CO2 27 12/20/2015   TSH 0.35 04/27/2015   INR 0.99 08/17/2014   HGBA1C 7.0 09/19/2016   MICROALBUR 2.5 (H) 04/27/2015      Assessment & Plan:

## 2016-09-22 DIAGNOSIS — M65332 Trigger finger, left middle finger: Secondary | ICD-10-CM | POA: Insufficient documentation

## 2016-09-22 DIAGNOSIS — M19049 Primary osteoarthritis, unspecified hand: Secondary | ICD-10-CM | POA: Insufficient documentation

## 2016-09-22 DIAGNOSIS — M21611 Bunion of right foot: Secondary | ICD-10-CM | POA: Insufficient documentation

## 2016-09-22 NOTE — Assessment & Plan Note (Signed)
Mild, for hand surgury referral

## 2016-09-22 NOTE — Assessment & Plan Note (Addendum)
Mild uncontrolled, for increased amlodipine 10 qd,  to f/u any worsening symptoms or concerns, f/u BP at home and with next renal appt  Note:  Total time for pt hx, exam, review of record with pt in the room, determination of diagnoses and plan for further eval and tx is > 40 min, with over 50% spent in coordination and counseling of patient, including the differential dx, eval and tx of multiple problems including uncontrolled HTN, cervical radiculitis, Hand arthritis, foot bunion, and trigger finger

## 2016-09-22 NOTE — Assessment & Plan Note (Signed)
With bilat cervical radiculitis, for gabapentin 100 tid x 1 wk, then 300 tid trial

## 2016-09-22 NOTE — Assessment & Plan Note (Signed)
Ok for volt gel prn topical,  to f/u any worsening symptoms or concerns

## 2016-09-22 NOTE — Assessment & Plan Note (Signed)
Mild to mod, for podiatry referral, pain control

## 2016-10-03 ENCOUNTER — Encounter: Payer: Self-pay | Admitting: Internal Medicine

## 2016-10-03 NOTE — Telephone Encounter (Signed)
PCCs to see pt concern 

## 2016-10-05 ENCOUNTER — Ambulatory Visit: Payer: Medicare Other | Admitting: Family Medicine

## 2016-10-05 ENCOUNTER — Encounter: Payer: Self-pay | Admitting: Family Medicine

## 2016-10-05 DIAGNOSIS — R21 Rash and other nonspecific skin eruption: Secondary | ICD-10-CM

## 2016-10-05 MED ORDER — TRIAMCINOLONE ACETONIDE 0.1 % EX CREA: 1.0000 "application " | TOPICAL_CREAM | Freq: Two times a day (BID) | CUTANEOUS | 0 refills | Status: DC

## 2016-10-05 NOTE — Progress Notes (Signed)
Heather Dawson - 61 y.o. female MRN [id number]  Date of birth: Jul 06, 1955  SUBJECTIVE:  Including CC & ROS.  Chief Complaint  Patient presents with  . Rash    was out mowing yard this morning, and came back inside to shower--found rash under both breasts, looks like shingles and patient has had shingles in past x3   Heather Dawson is a 61 year old female that is presenting with rashes on each lateral aspect of her breast. She was mowing the lawn earlier today and noticed the rash after she showered. It is nonpainful and not itchy. There is no drainage associated with it. She thinks it is shingles but is occurring on both sides. She denies any history of similar contact dermatitis. She knows him on a regular basis with no problems. She is diabetic and her last A1c was 7. She has a history of having shingles on 2 prior occasions but those were both on the left side of her head. These did not involve any significant pain either.     Review of Systems  Musculoskeletal: Negative for gait problem.  Skin: Positive for rash.  Neurological: Negative for weakness and numbness.  Hematological: Negative for adenopathy.   otherwise negative  HISTORY: Past Medical, Surgical, Social, and Family History Reviewed & Updated per EMR.   Pertinent Historical Findings include:  Past Medical History:  Diagnosis Date  . Abdominal pain, epigastric 12/20/2006  . ALLERGIC RHINITIS 12/25/2006  . Allergy   . ANXIETY 10/16/2006   no per pt  . Arthritis    right shoulder  . BACK PAIN 06/15/2008  . Blood transfusion without reported diagnosis   . BRADYCARDIA 05/04/2008  . Carpal tunnel syndrome 10/16/2006  . Cellulitis and abscess of other specified site 06/15/2008  . CHEST PAIN 09/08/2009  . Chronic low back pain   . CIRRHOSIS 10/16/2006  . CKD (chronic kidney disease)    2008- had Ecoli and caused renal failure, no problems since then  . CKD (chronic kidney disease) stage 3, GFR 30-59 ml/min 10/16/2006   Qualifier:  Diagnosis of  By: Jenny Reichmann MD, [place] 12/20/2006  . Complication of anesthesia    awake during 2 surgeries  . CONTUSION, LOWER LEG 09/15/2008  . DEPRESSION 10/16/2006   no per pt  . Esophageal reflux 12/25/2006  . HYPERLIPIDEMIA 12/25/2006  . Hyperlipidemia   . HYPERTENSION 08/15/2006  . Hypertension   . HYPOTHYROIDISM 08/15/2006  . Kidney failure, acute ([place]) 2009   as a result of severe E Coli infection  . Left-sided carotid artery disease ([place])   . MIGRAINE HEADACHE 10/16/2006  . Obesity 10/16/2006  . OVARIAN CYST 12/20/2006  . Psoriasis 01/03/2011  . SHINGLES, HX OF 12/20/2006  . Stroke [facility])    3 strokes 2008-no deficits, only on ASA  . TRANSIENT ISCHEMIC ATTACKS, HX OF 12/20/2006  . VERTIGO 09/15/2007  . VITAMIN B12 DEFICIENCY 01/09/2007    Past Surgical History:  Procedure Laterality Date  . ABDOMINAL HYSTERECTOMY  02/2006  . ANTERIOR CERVICAL DECOMP/DISCECTOMY FUSION  2005  . CESAREAN SECTION    . CHOLECYSTECTOMY    . COLONOSCOPY    . ENDARTERECTOMY Left 08/25/2014   Procedure: LEFT CAROTID ENDARTERECTOMY WITH HEMASHIELD PATCH ANGIOPLASTY;  Surgeon: Elam Dutch, MD;  Location: [facility];  Service: Vascular;  Laterality: Left;  . NECK SURGERY  2005  . ROTATOR CUFF REPAIR Right 02/2014   Dr Veverly Fells    Allergies  Allergen Reactions  . Cefuroxime Axetil  REACTION: edema/swelling  . Ciprofloxacin Swelling  . Lipitor [Atorvastatin] Other (See Comments)    Myalgia   . Lyrica [Pregabalin] Other (See Comments)    HA, confusion, swelling  . Gabapentin Rash    Family History  Problem Relation Age of Onset  . Stroke Father   . Heart disease Father   . Diabetes Father   . Lupus Mother   . Raynaud syndrome Mother   . Heart disease Mother   . Diabetes Sister   . Heart disease Brother   . Hypertension Brother   . Heart disease Brother   . Hypertension Brother   . Diabetes Other   . Hypertension Other   . Colon cancer Neg Hx   . Esophageal cancer  Neg Hx   . Rectal cancer Neg Hx   . Stomach cancer Neg Hx      Social History   Social History  . Marital status: Married    Spouse name: N/A  . Number of children: N/A  . Years of education: N/A   Occupational History  . Not on file.   Social History Main Topics  . Smoking status: Former Research scientist (life sciences)  . Smokeless tobacco: Never Used     Comment: quit smoking over 30 years ago  . Alcohol use No  . Drug use: No  . Sexual activity: Not on file   Other Topics Concern  . Not on file   Social History Narrative  . No narrative on file     PHYSICAL EXAM:  VS: BP (!) 146/82   Pulse 84   Temp 98.4 F (36.9 C)   Wt 210 lb (95.3 kg)   SpO2 98%   BMI 38.41 kg/m  Physical Exam Gen: NAD, alert, cooperative with exam, well-appearing ENT: normal lips, normal nasal mucosa,  Eye: normal EOM, normal conjunctiva and lids CV:  no edema, +2 pedal pulses   Resp: no accessory muscle use, non-labored,  Skin: coalescing red rash on the lateral aspect of each breast. Nothing under the breast to suggest intertrigo. No vesicle formation Neuro: normal tone, normal sensation to touch Psych:  normal insight, alert and oriented MSK: Normal strength, normal gait      ASSESSMENT & PLAN:   Rash This appears to be a contact dermatitis. Does not appear to be shingles is occurring bilaterally. There are no vesicle formation - Try triamcinolone cream. She is diabetic so avoiding oral prednisone for now - If there is no improvement may need to consider a prednisone Dosepak. - Supportive care measures as well.

## 2016-10-05 NOTE — Patient Instructions (Signed)
Thank you for coming in,   Please try the topical steroid. If there is no improvement then please call me. We may need to try some oral steroids. You can take allegra, or claritin or zyrtec for itching if its itching.    Please feel free to call with any questions or concerns at any time, at [phone]. --Dr. Raeford Razor

## 2016-10-05 NOTE — Assessment & Plan Note (Signed)
This appears to be a contact dermatitis. Does not appear to be shingles is occurring bilaterally. There are no vesicle formation - Try triamcinolone cream. She is diabetic so avoiding oral prednisone for now - If there is no improvement may need to consider a prednisone Dosepak. - Supportive care measures as well.

## 2016-10-11 ENCOUNTER — Ambulatory Visit (INDEPENDENT_AMBULATORY_CARE_PROVIDER_SITE_OTHER): Payer: Medicare Other

## 2016-10-11 ENCOUNTER — Ambulatory Visit (INDEPENDENT_AMBULATORY_CARE_PROVIDER_SITE_OTHER): Payer: Medicare Other | Admitting: Podiatry

## 2016-10-11 ENCOUNTER — Encounter: Payer: Self-pay | Admitting: Podiatry

## 2016-10-11 VITALS — BP 152/82 | HR 59 | Resp 16

## 2016-10-11 DIAGNOSIS — M21619 Bunion of unspecified foot: Secondary | ICD-10-CM | POA: Diagnosis not present

## 2016-10-11 NOTE — Progress Notes (Signed)
   Subjective:    Patient ID: Heather Dawson, female    DOB: 1955-05-04, 61 y.o.   MRN: [id number]  HPI Chief Complaint  Patient presents with  . Foot Pain    Bilateral; Bunions; pt stated, "Right foot hurts more than the left foot"; Jan. 2018      Review of Systems  All other systems reviewed and are negative.      Objective:   Physical Exam        Assessment & Plan:

## 2016-10-11 NOTE — Progress Notes (Signed)
Subjective:    Patient ID: Heather Dawson, female   DOB: 61 y.o.   MRN: [id number]   HPI patient presents stating she has a painful bunion on her right foot which is making ambulation difficult and the left one also is sore at times. States she has significant family history of this and she cannot wear shoe gear with any degree of comfort    Review of Systems  All other systems reviewed and are negative.       Objective:  Physical Exam  Cardiovascular: Intact distal pulses.   Pulmonary/Chest: Breath sounds normal.  Musculoskeletal: Normal range of motion.  Neurological: She is alert.  Skin: Skin is warm.  Nursing note and vitals reviewed.  neurovascular status intact muscle strength was adequate range of motion within normal limits with patient noted to have good digital perfusion and well oriented 3. Patient's found to have large hyperostosis medial aspect first metatarsal head right over left with redness around the bone surface and pain when palpated and mild dislocation of the second digit right over left foot. 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 At this point I have recommended distal osteotomy right due to long-standing nature problem and failure to respond to conservative care with considerations of other treatment depending on how it responds. Patient wants surgery and at this point since she's already been planning for this I did allow her to read consent form today reviewing alternative treatments complications with this procedure. She is completely aware and wants this done and at this time she signs consent form after extensive review and is scheduled for outpatient surgery. She understands total recovery will take 6 months to one year and today I did go ahead and I dispensed  air fracture walker with instructions on usage

## 2016-10-11 NOTE — Patient Instructions (Addendum)
Bunion A bunion is a bump on the base of the big toe that forms when the bones of the big toe joint move out of position. Bunions may be small at first, but they often get larger over time. The can make walking painful. What are the causes? A bunion may be caused by:  Wearing narrow or pointed shoes that force the big toe to press against the other toes.  Abnormal foot development that causes the foot to roll inward (pronate).  Changes in the foot that are caused by certain diseases, such as rheumatoid arthritis and polio.  A foot injury.  What increases the risk? The following factors may make you more likely to develop this condition:  Wearing shoes that squeeze the toes together.  Having certain diseases, such as: ? Rheumatoid arthritis. ? Polio. ? Cerebral palsy.  Having family members who have bunions.  Being born with a foot deformity, such as flat feet or low arches.  Doing activities that put a lot of pressure on the feet, such as ballet dancing.  What are the signs or symptoms? The main symptom of a bunion is a noticeable bump on the big toe. Other symptoms may include:  Pain.  Swelling around the big toe.  Redness and inflammation.  Thick or hardened skin on the big toe or between the toes.  Stiffness or loss of motion in the big toe.  Trouble with walking.  How is this diagnosed? A bunion may be diagnosed based on your symptoms, medical history, and activities. You may have tests, such as:  X-rays. These allow your health care provider to check the position of the bones in your foot and look for damage to your joint. They also help your health care provider to determine the severity of your bunion and the best way to treat it.  Joint aspiration. In this test, a sample of fluid is removed from the toe joint. This test, which may be done if you are in a lot of pain, helps to rule out diseases that cause painful swelling of the joints, such as  arthritis.  How is this treated? There is no cure for a bunion, but treatment can help to prevent a bunion from getting worse. Treatment depends on the severity of your symptoms. Your health care provider may recommend:  Wearing shoes that have a wide toe box.  Using bunion pads to cushion the affected area.  Taping your toes together to keep them in a normal position.  Placing a device inside your shoe (orthotics) to help reduce pressure on your toe joint.  Taking medicine to ease pain, inflammation, and swelling.  Applying heat or ice to the affected area.  Doing stretching exercises.  Surgery to remove scar tissue and move the toes back into their normal position. This treatment is rare.  Follow these instructions at home:  Support your toe joint with proper footwear, shoe padding, or taping as told by your health care provider.  Take over-the-counter and prescription medicines only as told by your health care provider.  If directed, apply ice to the injured area: ? Put ice in a plastic bag. ? Place a towel between your skin and the bag. ? Leave the ice on for 20 minutes, 2-3 times per day.  If directed, apply heat to the affected area before you exercise. Use the heat source that your health care provider recommends, such as a moist heat pack or a heating pad. ? Place a towel between your   skin and the heat source. ? Leave the heat on for 20-30 minutes. ? Remove the heat if your skin turns bright red. This is especially important if you are unable to feel pain, heat, or cold. You may have a greater risk of getting burned.  Do exercises as told by your health care provider.  Keep all follow-up visits as told by your health care provider. Contact a health care provider if:  Your symptoms get worse.  Your symptoms do not improve in 2 weeks. Get help right away if:  You have severe pain and trouble with walking. This information is not intended to replace advice given  to you by your health care provider. Make sure you discuss any questions you have with your health care provider. Document Released: 02/05/2005 Document Revised: 07/14/2015 Document Reviewed: 09/05/2014 Elsevier Interactive Patient Education  2018 [place] Instructions  Congratulations, you have decided to take an important step towards improving your quality of life.  You can be assured that the doctors and staff at [facility] will be with you every step of the way.  Here are some important things you should know:  1. Plan to be at the surgery center/hospital at least 1 (one) hour prior to your scheduled time, unless otherwise directed by the surgical center/hospital staff.  You must have a responsible adult accompany you, remain during the surgery and drive you home.  Make sure you have directions to the surgical center/hospital to ensure you arrive on time. 2. If you are having surgery at [facility] or [facility], you will need a copy of your medical history and physical form from your family physician within one month prior to the date of surgery. We will give you a form for your primary physician to complete.  3. We make every effort to accommodate the date you request for surgery.  However, there are times where surgery dates or times have to be moved.  We will contact you as soon as possible if a change in schedule is required.   4. No aspirin/ibuprofen for one week before surgery.  If you are on aspirin, any non-steroidal anti-inflammatory medications (Mobic, Aleve, Ibuprofen) should not be taken seven (7) days prior to your surgery.  You make take Tylenol for pain prior to surgery.  5. Medications - If you are taking daily heart and blood pressure medications, seizure, reflux, allergy, asthma, anxiety, pain or diabetes medications, make sure you notify the surgery center/hospital before the day of surgery so they can tell you which medications you should  take or avoid the day of surgery. 6. No food or drink after midnight the night before surgery unless directed otherwise by surgical center/hospital staff. 7. No alcoholic beverages 43-PIRJJ prior to surgery.  No smoking 24-hours prior or 24-hours after surgery. 8. Wear loose pants or shorts. They should be loose enough to fit over bandages, boots, and casts. 9. Don't wear slip-on shoes. Sneakers are preferred. 10. Bring your boot with you to the surgery center/hospital.  Also bring crutches or a walker if your physician has prescribed it for you.  If you do not have this equipment, it will be provided for you after surgery. 11. If you have not been contacted by the surgery center/hospital by the day before your surgery, call to confirm the date and time of your surgery. 12. Leave-time from work may vary depending on the type of surgery you have.  Appropriate arrangements should be made prior  to surgery with your employer. 13. Prescriptions will be provided immediately following surgery by your doctor.  Fill these as soon as possible after surgery and take the medication as directed. Pain medications will not be refilled on weekends and must be approved by the doctor. 14. Remove nail polish on the operative foot and avoid getting pedicures prior to surgery. 15. Wash the night before surgery.  The night before surgery wash the foot and leg well with water and the antibacterial soap provided. Be sure to pay special attention to beneath the toenails and in between the toes.  Wash for at least three (3) minutes. Rinse thoroughly with water and dry well with a towel.  Perform this wash unless told not to do so by your physician.  Enclosed: 1 Ice pack (please put in freezer the night before surgery)   1 Hibiclens skin cleaner   Pre-op instructions  If you have any questions regarding the instructions, please do not hesitate to call our office.  [place]: [address] [place], [place] [address] --  [place]: [address] [place], [place] [address] -- [phone]  [place]: [place], [place] [address] -- [phone]  [place]: [address] [place], [place], [place] [address] -- [phone]  Website: [email or web address]

## 2016-10-15 DIAGNOSIS — Z130 Encounter for screening for diseases of the blood and blood-forming organs and certain disorders involving the immune mechanism: Secondary | ICD-10-CM | POA: Diagnosis not present

## 2016-10-15 DIAGNOSIS — Z1272 Encounter for screening for malignant neoplasm of vagina: Secondary | ICD-10-CM | POA: Diagnosis not present

## 2016-10-15 DIAGNOSIS — Z1231 Encounter for screening mammogram for malignant neoplasm of breast: Secondary | ICD-10-CM | POA: Diagnosis not present

## 2016-10-15 DIAGNOSIS — Z0000 Encounter for general adult medical examination without abnormal findings: Secondary | ICD-10-CM | POA: Diagnosis not present

## 2016-10-16 ENCOUNTER — Ambulatory Visit (INDEPENDENT_AMBULATORY_CARE_PROVIDER_SITE_OTHER): Payer: Medicare Other | Admitting: Orthopaedic Surgery

## 2016-10-16 ENCOUNTER — Encounter (INDEPENDENT_AMBULATORY_CARE_PROVIDER_SITE_OTHER): Payer: Self-pay | Admitting: Orthopaedic Surgery

## 2016-10-16 DIAGNOSIS — M65332 Trigger finger, left middle finger: Secondary | ICD-10-CM | POA: Diagnosis not present

## 2016-10-16 MED ORDER — LIDOCAINE HCL 1 % IJ SOLN
0.3000 mL | INTRAMUSCULAR | Status: AC | PRN
Start: 2016-10-16 — End: 2016-10-16
  Administered 2016-10-16: .3 mL

## 2016-10-16 MED ORDER — BUPIVACAINE HCL 0.5 % IJ SOLN
0.3300 mL | INTRAMUSCULAR | Status: AC | PRN
  Administered 2016-10-16: .33 mL

## 2016-10-16 MED ORDER — METHYLPREDNISOLONE ACETATE 40 MG/ML IJ SUSP
13.3300 mg | INTRAMUSCULAR | Status: AC | PRN
  Administered 2016-10-16: 13.33 mg

## 2016-10-16 NOTE — Progress Notes (Signed)
Office Visit Note   Patient: Heather Dawson           Date of Birth: September 05, 1955           MRN: [id number] Visit Date: 10/16/2016              Requested by: Biagio Borg, MD [place],  [id number] PCP: Biagio Borg, MD   Assessment & Plan: Visit Diagnoses:  1. Trigger finger, left middle finger     Plan: Overall impression is left middle trigger finger without consistent triggering. Trigger finger injection was performed today. Patient tolerated well. Follow up as needed. Questions encouraged and answered.  Follow-Up Instructions: Return if symptoms worsen or fail to improve.   Orders:  No orders of the defined types were placed in this encounter.  No orders of the defined types were placed in this encounter.     Procedures: Hand/UE Inj Date/Time: 10/16/2016 9:44 AM Performed by: Leandrew Koyanagi Authorized by: Leandrew Koyanagi   Consent Given by:  Patient Timeout: prior to procedure the correct patient, procedure, and site was verified   Indications:  Pain Condition: trigger finger   Location:  Long finger (see office note for details) Site:  L long A1 Prep: patient was prepped and draped in usual sterile fashion   Needle Size:  25 G Approach:  [place] Medications:  0.3 mL lidocaine 1 %; 0.33 mL bupivacaine 0.5 %; 13.33 mg methylPREDNISolone acetate 40 MG/ML     Clinical Data: No additional findings.   Subjective: Chief Complaint  Patient presents with  . Left Hand - Pain    Patient is a 61 year old female comes in with a left middle trigger finger without any significant triggering but does occasionally lock up. This is been going on for a month and is getting worse. She is left-hand dominant. She denies any rheumatoid arthritis. She is diabetic fat is diet controlled. Denies any numbness and tingling    Review of Systems  Constitutional: Negative.   HENT: Negative.   Eyes: Negative.   Respiratory: Negative.   Cardiovascular:  Negative.   Endocrine: Negative.   Musculoskeletal: Negative.   Neurological: Negative.   Hematological: Negative.   Psychiatric/Behavioral: Negative.   All other systems reviewed and are negative.    Objective: Vital Signs: There were no vitals taken for this visit.  Physical Exam  Constitutional: She is oriented to person, place, and time. She appears well-developed and well-nourished.  HENT:  Head: Normocephalic and atraumatic.  Eyes: EOM are normal.  Neck: Neck supple.  Pulmonary/Chest: Effort normal.  Abdominal: Soft.  Neurological: She is alert and oriented to person, place, and time.  Skin: Skin is warm. Capillary refill takes less than 2 seconds.  Psychiatric: She has a normal mood and affect. Her behavior is normal. Judgment and thought content normal.  Nursing note and vitals reviewed.   Ortho Exam Left middle finger shows pain at the metacarpal head. She has limitation in full flexion of the finger. She has full extension. Specialty Comments:  No specialty comments available.  Imaging: No results found.   PMFS History: Patient Active Problem List   Diagnosis Date Noted  . Rash 10/05/2016  . Bunion of right foot 09/22/2016  . Trigger middle finger of left hand 09/22/2016  . Arthritis of hand, degenerative 09/22/2016  . Left foot pain 02/22/2016  . Pain and swelling of right lower leg 02/22/2016  . Neck pain 02/01/2016  . Postop  carotid endarterectomy surveillance, encounter for 12/01/2015  . Cough 04/27/2015  . Left carotid artery stenosis 08/25/2014  . 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 Stenosis of right carotid artery 07/27/2014  . Chronic low back pain 07/14/2014  . 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 Dizziness and giddiness 11/06/2013  . GERD (gastroesophageal reflux disease) 11/06/2013  . Influenza 02/21/2013  . Paronychia 12/30/2012  . Encounter for long-term (current) use of high-risk medication 03/02/2011  . Psoriasis  01/03/2011  . Preventative health care 12/30/2010  . CHEST PAIN 09/08/2009  . LEG CRAMPS 06/20/2009  . HEMATOCHEZIA 03/21/2009  . GANGLION CYST, TENDON SHEATH 11/18/2008  . BACK PAIN 06/15/2008  . TRANSIENT ISCHEMIC ATTACK 10/14/2007  . ESOPHAGITIS 03/13/2007  . VITAMIN B12 DEFICIENCY 01/09/2007  . Diabetes ([place]) 12/25/2006  . Hyperlipidemia 12/25/2006  . Allergic rhinitis 12/25/2006  . Esophageal reflux 12/25/2006  . COMMON MIGRAINE 12/20/2006  . OVARIAN CYST 12/20/2006  . Other malaise and fatigue 12/20/2006  . SHINGLES, HX OF 12/20/2006  . CHOLERA, D/T VIBRIO CHOLERAE EL TOR 10/16/2006  . Morbid obesity ([place]) 10/16/2006  . ANXIETY 10/16/2006  . DEPRESSION 10/16/2006  . Carpal tunnel syndrome 10/16/2006  . CKD (chronic kidney disease) stage 3, GFR 30-59 ml/min 10/16/2006  . HYPOTHYROIDISM 08/15/2006  . Essential hypertension 08/15/2006   Past Medical History:  Diagnosis Date  . Abdominal pain, epigastric 12/20/2006  . ALLERGIC RHINITIS 12/25/2006  . Allergy   . ANXIETY 10/16/2006   no per pt  . Arthritis    right shoulder  . BACK PAIN 06/15/2008  . Blood transfusion without reported diagnosis   . BRADYCARDIA 05/04/2008  . Carpal tunnel syndrome 10/16/2006  . Cellulitis and abscess of other specified site 06/15/2008  . CHEST PAIN 09/08/2009  . Chronic low back pain   . CIRRHOSIS 10/16/2006  . CKD (chronic kidney disease)    2008- had Ecoli and caused renal failure, no problems since then  . CKD (chronic kidney disease) stage 3, GFR 30-59 ml/min 10/16/2006   Qualifier: Diagnosis of  By: Jenny Reichmann MD, [place] 12/20/2006  . Complication of anesthesia    awake during 2 surgeries  . CONTUSION, LOWER LEG 09/15/2008  . DEPRESSION 10/16/2006   no per pt  . Esophageal reflux 12/25/2006  . HYPERLIPIDEMIA 12/25/2006  . Hyperlipidemia   . HYPERTENSION 08/15/2006  . Hypertension   . HYPOTHYROIDISM 08/15/2006  . Kidney failure, acute ([place]) 2009   as a result of severe  E Coli infection  . Left-sided carotid artery disease ([place])   . MIGRAINE HEADACHE 10/16/2006  . Obesity 10/16/2006  . OVARIAN CYST 12/20/2006  . Psoriasis 01/03/2011  . SHINGLES, HX OF 12/20/2006  . Stroke [facility])    3 strokes 2008-no deficits, only on ASA  . TRANSIENT ISCHEMIC ATTACKS, HX OF 12/20/2006  . VERTIGO 09/15/2007  . VITAMIN B12 DEFICIENCY 01/09/2007    Family History  Problem Relation Age of Onset  . Stroke Father   . Heart disease Father   . Diabetes Father   . Lupus Mother   . Raynaud syndrome Mother   . Heart disease Mother   . Diabetes Sister   . Heart disease Brother   . Hypertension Brother   . Heart disease Brother   . Hypertension Brother   . Diabetes Other   . Hypertension Other   . Colon cancer Neg Hx   . Esophageal cancer Neg Hx   . Rectal cancer Neg Hx   . Stomach cancer  Neg Hx     Past Surgical History:  Procedure Laterality Date  . ABDOMINAL HYSTERECTOMY  02/2006  . ANTERIOR CERVICAL DECOMP/DISCECTOMY FUSION  2005  . CESAREAN SECTION    . CHOLECYSTECTOMY    . COLONOSCOPY    . ENDARTERECTOMY Left 08/25/2014   Procedure: LEFT CAROTID ENDARTERECTOMY WITH HEMASHIELD PATCH ANGIOPLASTY;  Surgeon: Elam Dutch, MD;  Location: [facility];  Service: Vascular;  Laterality: Left;  . NECK SURGERY  2005  . ROTATOR CUFF REPAIR Right 02/2014   Dr Veverly Fells   Social History   Occupational History  . Not on file.   Social History Main Topics  . Smoking status: Former Research scientist (life sciences)  . Smokeless tobacco: Never Used     Comment: quit smoking over 30 years ago  . Alcohol use No  . Drug use: No  . Sexual activity: Not on file

## 2016-10-30 ENCOUNTER — Encounter: Payer: Self-pay | Admitting: Podiatry

## 2016-10-30 DIAGNOSIS — M2041 Other hammer toe(s) (acquired), right foot: Secondary | ICD-10-CM | POA: Diagnosis not present

## 2016-10-30 DIAGNOSIS — M21541 Acquired clubfoot, right foot: Secondary | ICD-10-CM | POA: Diagnosis not present

## 2016-10-30 DIAGNOSIS — M2011 Hallux valgus (acquired), right foot: Secondary | ICD-10-CM | POA: Diagnosis not present

## 2016-11-01 NOTE — Progress Notes (Signed)
DOS 10/30/2016 [place] Bunionectomy RT

## 2016-11-05 ENCOUNTER — Telehealth: Payer: Self-pay | Admitting: Podiatry

## 2016-11-05 NOTE — Telephone Encounter (Signed)
I spoke with pt and she states it pretty much throbs all the time. I instructed pt to remove the surgical boot, open-ended sock, and ace wrap and elevate the foot for 15 minutes, if the pain worsens then dangle the foot for 15 minutes, then put the foot level with her hips and rewrap the ace wrap looser and replace the open-ended sock then the boot. Pt states understanding.

## 2016-11-05 NOTE — Telephone Encounter (Signed)
I had surgery this past Tuesday 11 September by Dr. Paulla Dolly. I still seem to be having a lot of throbbing pain in my foot especially when I put my boot on it kills my foot when I put my boot on when I get up. I didn't know if the bandage was wrapped too tight on my foot, if the shoe is too small, I just don't know. I'm still continuing to have to take pain pills at this point which is almost a week later. You can call me back at [phone].

## 2016-11-07 ENCOUNTER — Ambulatory Visit: Payer: BLUE CROSS/BLUE SHIELD | Admitting: Internal Medicine

## 2016-11-09 ENCOUNTER — Ambulatory Visit (INDEPENDENT_AMBULATORY_CARE_PROVIDER_SITE_OTHER): Payer: Medicare Other | Admitting: Podiatry

## 2016-11-09 ENCOUNTER — Encounter: Payer: Self-pay | Admitting: Podiatry

## 2016-11-09 ENCOUNTER — Ambulatory Visit (INDEPENDENT_AMBULATORY_CARE_PROVIDER_SITE_OTHER): Payer: Medicare Other

## 2016-11-09 VITALS — BP 152/76 | HR 54 | Temp 97.6°F

## 2016-11-09 DIAGNOSIS — M21619 Bunion of unspecified foot: Secondary | ICD-10-CM

## 2016-11-09 NOTE — Progress Notes (Signed)
Subjective:    Patient ID: Heather Dawson, female   DOB: 61 y.o.   MRN: [id number]   HPI patient states doing real well with some pain within the joint if she does a lot of walking but overall feeling good    ROS      Objective:  Physical Exam neurovascular status intact negative Homans sign noted with wound edges well coapted right first MPJ with good alignment noted of the joint and excellent correction of underlying deformity     Assessment:   Doing well post osteotomy      Plan:    Reviewed the continuation of conservative care elevation compression immobilization and reapplied sterile dressing and gave instructions on continued elevation compression. Reappoint in 3 weeks or earlier if needed  X-rays indicate osteotomy is healing well with pins in place and excellent correction of the IM angle and joint congruence

## 2016-11-13 ENCOUNTER — Ambulatory Visit: Payer: Medicare Other | Admitting: Internal Medicine

## 2016-11-13 ENCOUNTER — Other Ambulatory Visit (INDEPENDENT_AMBULATORY_CARE_PROVIDER_SITE_OTHER): Payer: Medicare Other

## 2016-11-13 ENCOUNTER — Encounter: Payer: Self-pay | Admitting: Internal Medicine

## 2016-11-13 VITALS — BP 136/86 | HR 70 | Temp 98.3°F | Ht 62.0 in | Wt 195.0 lb

## 2016-11-13 DIAGNOSIS — I10 Essential (primary) hypertension: Secondary | ICD-10-CM | POA: Diagnosis not present

## 2016-11-13 DIAGNOSIS — Z0000 Encounter for general adult medical examination without abnormal findings: Secondary | ICD-10-CM | POA: Diagnosis not present

## 2016-11-13 DIAGNOSIS — Z23 Encounter for immunization: Secondary | ICD-10-CM

## 2016-11-13 DIAGNOSIS — N183 Chronic kidney disease, stage 3 unspecified: Secondary | ICD-10-CM

## 2016-11-13 DIAGNOSIS — E119 Type 2 diabetes mellitus without complications: Secondary | ICD-10-CM

## 2016-11-13 LAB — HEPATIC FUNCTION PANEL
ALT: 25 U/L (ref 0–35)
AST: 15 U/L (ref 0–37)
Albumin: 4.6 g/dL (ref 3.5–5.2)
Alkaline Phosphatase: 62 U/L (ref 39–117)
Bilirubin, Direct: 0.1 mg/dL (ref 0.0–0.3)
Total Bilirubin: 0.7 mg/dL (ref 0.2–1.2)
Total Protein: 6.9 g/dL (ref 6.0–8.3)

## 2016-11-13 LAB — BASIC METABOLIC PANEL
BUN: 37 mg/dL — ABNORMAL HIGH (ref 6–23)
CO2: 25 mEq/L (ref 19–32)
Calcium: 9.8 mg/dL (ref 8.4–10.5)
Chloride: 106 mEq/L (ref 96–112)
Creatinine, Ser: 1.83 mg/dL — ABNORMAL HIGH (ref 0.40–1.20)
GFR: 29.93 mL/min — ABNORMAL LOW (ref >60.00)
Glucose, Bld: 115 mg/dL — ABNORMAL HIGH (ref 70–99)
Potassium: 4.6 mEq/L (ref 3.5–5.1)
Sodium: 139 mEq/L (ref 135–145)

## 2016-11-13 LAB — LDL CHOLESTEROL, DIRECT: Direct LDL: 94 mg/dL

## 2016-11-13 LAB — LIPID PANEL
Cholesterol: 163 mg/dL (ref 0–200)
HDL: 33.1 mg/dL — ABNORMAL LOW (ref >39.00)
NonHDL: 129.97
Total CHOL/HDL Ratio: 5
Triglycerides: 258 mg/dL — ABNORMAL HIGH (ref 0.0–149.0)
VLDL: 51.6 mg/dL — ABNORMAL HIGH (ref 0.0–40.0)

## 2016-11-13 LAB — HEMOGLOBIN A1C: Hgb A1c MFr Bld: 6.9 % — ABNORMAL HIGH (ref 4.6–6.5)

## 2016-11-13 NOTE — Assessment & Plan Note (Signed)
stable overall by history and exam, recent data reviewed with pt, and pt to continue medical treatment as before,  to f/u any worsening symptoms or concerns Lab Results  Component Value Date   HGBA1C 7.0 09/19/2016  Hopefully more improved with wt loss now,

## 2016-11-13 NOTE — Assessment & Plan Note (Addendum)
stable overall by history and exam, recent data reviewed with pt, and pt to continue medical treatment as before,  to f/u any worsening symptoms or concerns Lab Results  Component Value Date   CREATININE 1.47 (H) 12/20/2015  for f/u lab today, avoid nephrotoxins

## 2016-11-13 NOTE — Patient Instructions (Signed)

## 2016-11-13 NOTE — Assessment & Plan Note (Signed)
stable overall by history and exam, recent data reviewed with pt, and pt to continue medical treatment as before,  to f/u any worsening symptoms or concerns BP Readings from Last 3 Encounters:  11/13/16 136/86  11/09/16 (!) 152/76  10/11/16 (!) 152/82

## 2016-11-13 NOTE — Progress Notes (Signed)
Subjective:    Patient ID: Heather Dawson, female    DOB: [birth year], 61 y.o.   MRN: [id number]  HPI  Here to f/u; overall doing ok,  Pt denies chest pain, increasing sob or doe, wheezing, orthopnea, PND, increased LE swelling, palpitations, dizziness or syncope.  Pt denies new neurological symptoms such as new headache, or facial or extremity weakness or numbness.  Pt denies polydipsia, polyuria, or low sugar episode.  Pt states overall good compliance with meds, mostly trying to follow appropriate diet, with wt overall stable,  but little exercise however. Just s/p right buniionectomy per Dr Paulla Dolly x 2 wks, doing well with much less pain and swelling.  Sees Renal Dr Si Gaul currently scheduled for q 6 mo, next in Nov.  Lost 5 kbs recently with attention to diet,.  Had some SI joint pain recenty due to heavy boot wearing Wt Readings from Last 3 Encounters:  11/13/16 195 lb (88.5 kg)  10/05/16 210 lb (95.3 kg)  09/19/16 209 lb (94.8 kg)   Past Medical History:  Diagnosis Date  . Abdominal pain, epigastric 12/20/2006  . ALLERGIC RHINITIS 12/25/2006  . Allergy   . ANXIETY 10/16/2006   no per pt  . Arthritis    right shoulder  . BACK PAIN 06/15/2008  . Blood transfusion without reported diagnosis   . BRADYCARDIA 05/04/2008  . Carpal tunnel syndrome 10/16/2006  . Cellulitis and abscess of other specified site 06/15/2008  . CHEST PAIN 09/08/2009  . Chronic low back pain   . CIRRHOSIS 10/16/2006  . CKD (chronic kidney disease)    2008- had Ecoli and caused renal failure, no problems since then  . CKD (chronic kidney disease) stage 3, GFR 30-59 ml/min 10/16/2006   Qualifier: Diagnosis of  By: Jenny Reichmann MD, [place] 12/20/2006  . Complication of anesthesia    awake during 2 surgeries  . CONTUSION, LOWER LEG 09/15/2008  . DEPRESSION 10/16/2006   no per pt  . Esophageal reflux 12/25/2006  . HYPERLIPIDEMIA 12/25/2006  . Hyperlipidemia   . HYPERTENSION 08/15/2006  . Hypertension   .  HYPOTHYROIDISM 08/15/2006  . Kidney failure, acute ([place]) 2009   as a result of severe E Coli infection  . Left-sided carotid artery disease ([place])   . MIGRAINE HEADACHE 10/16/2006  . Obesity 10/16/2006  . OVARIAN CYST 12/20/2006  . Psoriasis 01/03/2011  . SHINGLES, HX OF 12/20/2006  . Stroke [place])    3 strokes 2008-no deficits, only on ASA  . TRANSIENT ISCHEMIC ATTACKS, HX OF 12/20/2006  . VERTIGO 09/15/2007  . VITAMIN B12 DEFICIENCY 01/09/2007   Past Surgical History:  Procedure Laterality Date  . ABDOMINAL HYSTERECTOMY  02/2006  . ANTERIOR CERVICAL DECOMP/DISCECTOMY FUSION  2005  . CESAREAN SECTION    . CHOLECYSTECTOMY    . COLONOSCOPY    . ENDARTERECTOMY Left 08/25/2014   Procedure: LEFT CAROTID ENDARTERECTOMY WITH HEMASHIELD PATCH ANGIOPLASTY;  Surgeon: Elam Dutch, MD;  Location: [facility];  Service: Vascular;  Laterality: Left;  . NECK SURGERY  2005  . ROTATOR CUFF REPAIR Right 02/2014   Dr Veverly Fells    reports that she has quit smoking. She has never used smokeless tobacco. She reports that she does not drink alcohol or use drugs. family history includes Diabetes in her father, other, and sister; Heart disease in her brother, brother, father, and mother; Hypertension in her brother, brother, and other; Lupus in her mother; Raynaud syndrome in her mother; Stroke in her father.  Allergies  Allergen Reactions  . Cefuroxime Axetil     REACTION: edema/swelling  . Ciprofloxacin Swelling  . Lipitor [Atorvastatin] Other (See Comments)    Myalgia   . Lyrica [Pregabalin] Other (See Comments)    HA, confusion, swelling  . Gabapentin Rash   Current Outpatient Prescriptions on File Prior to Visit  Medication Sig Dispense Refill  . acetaminophen (TYLENOL) 500 MG tablet Take 1,000 mg by mouth every 8 (eight) hours as needed (pain).    Marland Kitchen amLODipine (NORVASC) 10 MG tablet Take 1 tablet (10 mg total) by mouth daily. 90 tablet 3  . aspirin 325 MG EC tablet Take 325 mg by mouth daily.      .  calcium citrate-vitamin D 500-400 MG-UNIT chewable tablet Chew 2 tablets by mouth daily.    . Cholecalciferol (VITAMIN D3) 1000 units CAPS Take 2 capsules (2,000 Units total) by mouth daily. 60 capsule 0  . clobetasol (TEMOVATE) 0.05 % external solution   0  . diclofenac sodium (VOLTAREN) 1 % GEL Apply 4 g topically 4 (four) times daily as needed. 400 g 5  . fexofenadine (ALLEGRA) 180 MG tablet Take 1 tablet (180 mg total) by mouth every other day. 45 tablet 3  . FLUoxetine (PROZAC) 20 MG tablet Take 1 tablet (20 mg total) by mouth daily. 90 tablet 3  . levothyroxine (SYNTHROID, LEVOTHROID) 112 MCG tablet Take 1 tablet (112 mcg total) by mouth daily. 90 tablet 3  . lisinopril (PRINIVIL,ZESTRIL) 20 MG tablet Take 1 tablet (20 mg total) by mouth daily. 90 tablet 3  . lovastatin (MEVACOR) 40 MG tablet Take 1 tablet (40 mg total) by mouth at bedtime. 90 tablet 3  . omeprazole (PRILOSEC) 20 MG capsule Take 1 capsule (20 mg total) by mouth daily. 90 capsule 3  . SUMAtriptan (IMITREX) 100 MG tablet TAKE 1 TABLET BY MOUTH EVERY 2 HOURS AS NEEDED FOR MIGRAINE OR HEADACHE.  MAY REPEAT IN 2 HOURS IF HEADACHE PERSISTS OR RECURS. 10 tablet 5  . triamcinolone cream (KENALOG) 0.1 % Apply 1 application topically 2 (two) times daily. 30 g 0  . TURMERIC CURCUMIN PO Take 500 mg by mouth daily.     No current facility-administered medications on file prior to visit.    Review of Systems  Constitutional: Negative for other unusual diaphoresis or sweats HENT: Negative for ear discharge or swelling Eyes: Negative for other worsening visual disturbances Respiratory: Negative for stridor or other swelling  Gastrointestinal: Negative for worsening distension or other blood Genitourinary: Negative for retention or other urinary change Musculoskeletal: Negative for other MSK pain or swelling Skin: Negative for color change or other new lesions Neurological: Negative for worsening tremors and other numbness    Psychiatric/Behavioral: Negative for worsening agitation or other fatigue All other system neg per pt    Objective:   Physical Exam BP 136/86   Pulse 70   Temp 98.3 F (36.8 C) (Oral)   Ht 5\' 2"  (1.575 m)   Wt 195 lb (88.5 kg)   SpO2 99%   BMI 35.67 kg/m  VS noted, obese but less so Constitutional: Pt appears in NAD HENT: Head: NCAT.  Right Ear: External ear normal.  Left Ear: External ear normal.  Eyes: . Pupils are equal, round, and reactive to light. Conjunctivae and EOM are normal Nose: without d/c or deformity Neck: Neck supple. Gross normal ROM Cardiovascular: Normal rate and regular rhythm.   Pulmonary/Chest: Effort normal and breath sounds without rales or wheezing.  Abd:  Soft,  NT, ND, + BS, no organomegaly Neurological: Pt is alert. At baseline orientation, motor grossly intact Skin: Skin is warm. No rashes, other new lesions, no LE edema Psychiatric: Pt behavior is normal without agitation  No other exam findings Lab Results  Component Value Date   WBC 7.9 04/27/2015   HGB 14.7 04/27/2015   HCT 43.1 04/27/2015   PLT 210.0 04/27/2015   GLUCOSE 115 (H) 12/20/2015   CHOL 177 12/20/2015   TRIG 203.0 (H) 12/20/2015   HDL 43.60 12/20/2015   LDLDIRECT 109.0 12/20/2015   LDLCALC 131 (H) 04/27/2015   ALT 18 12/20/2015   AST 15 12/20/2015   NA 141 12/20/2015   K 4.5 12/20/2015   CL 106 12/20/2015   CREATININE 1.47 (H) 12/20/2015   BUN 24 (H) 12/20/2015   CO2 27 12/20/2015   TSH 0.35 04/27/2015   INR 0.99 08/17/2014   HGBA1C 7.0 09/19/2016   MICROALBUR 2.5 (H) 04/27/2015       Assessment & Plan:

## 2016-11-19 ENCOUNTER — Telehealth: Payer: Self-pay | Admitting: Emergency Medicine

## 2016-11-19 NOTE — Telephone Encounter (Signed)
Done

## 2016-11-19 NOTE — Telephone Encounter (Signed)
Pt called and stated that usually her labs are sent over to her kidney doctor. They have moved her appt up to Oct 10th. She asked if you can send her lab work over to [facility] so they can review it before she is seen. The fax number is [phone]. Thanks.

## 2016-11-23 ENCOUNTER — Encounter: Payer: Self-pay | Admitting: Family Medicine

## 2016-11-23 ENCOUNTER — Ambulatory Visit: Payer: Medicare Other | Admitting: Family Medicine

## 2016-11-23 ENCOUNTER — Other Ambulatory Visit (INDEPENDENT_AMBULATORY_CARE_PROVIDER_SITE_OTHER): Payer: Medicare Other

## 2016-11-23 ENCOUNTER — Telehealth: Payer: Self-pay | Admitting: Internal Medicine

## 2016-11-23 ENCOUNTER — Telehealth: Payer: Self-pay

## 2016-11-23 VITALS — BP 156/82 | HR 71 | Temp 97.5°F | Ht 62.0 in | Wt 193.1 lb

## 2016-11-23 DIAGNOSIS — R3915 Urgency of urination: Secondary | ICD-10-CM

## 2016-11-23 DIAGNOSIS — I6522 Occlusion and stenosis of left carotid artery: Secondary | ICD-10-CM

## 2016-11-23 LAB — URINALYSIS
Bilirubin Urine: NEGATIVE
Ketones, ur: NEGATIVE
Leukocytes, UA: NEGATIVE
Nitrite: NEGATIVE
Specific Gravity, Urine: 1.015 (ref 1.000–1.030)
Total Protein, Urine: NEGATIVE
Urine Glucose: NEGATIVE
Urobilinogen, UA: 0.2 (ref 0.0–1.0)
pH: 5.5 (ref 5.0–8.0)

## 2016-11-23 MED ORDER — FLUCONAZOLE 150 MG PO TABS: 150.0000 mg | ORAL_TABLET | Freq: Once | ORAL | 0 refills | Status: AC

## 2016-11-23 NOTE — Progress Notes (Signed)
Heather Dawson - 61 y.o. female MRN [id number]  Date of birth: 11-Aug-1955  SUBJECTIVE:  Including CC & ROS.  Chief Complaint  Patient presents with  . Urinary Urgency    Patient is C/O urinary urgency x2d.  She states that all she does is dribble when she goes and it burns real bad.     Heather Dawson is a 61 year old female that is presenting with urinary urgency. Her symptoms have been present for 2 days. She has been having increasing voids as well. She has some pain with voiding. Denies any fevers or chills. Does not demonstrate any back pain other than normal. Denies seeing any blood in her urine. Reports a history of UTI and this feels similar to that.  She reports a history of nephrolithiasis and sepsis. She is left with one kidney after this infection. She is followed at [facility].   Review of her blood work from 11/13/16 shows a worsening creatinine and filtration rate. Her A1c is 6.9.  Review of Systems  Constitutional: Negative for fever.  Genitourinary: Positive for dysuria.  Skin: Negative for color change and rash.    HISTORY: Past Medical, Surgical, Social, and Family History Reviewed & Updated per EMR.   Pertinent Historical Findings include:  Past Medical History:  Diagnosis Date  . Abdominal pain, epigastric 12/20/2006  . ALLERGIC RHINITIS 12/25/2006  . Allergy   . ANXIETY 10/16/2006   no per pt  . Arthritis    right shoulder  . BACK PAIN 06/15/2008  . Blood transfusion without reported diagnosis   . BRADYCARDIA 05/04/2008  . Carpal tunnel syndrome 10/16/2006  . Cellulitis and abscess of other specified site 06/15/2008  . CHEST PAIN 09/08/2009  . Chronic low back pain   . CIRRHOSIS 10/16/2006  . CKD (chronic kidney disease)    2008- had Ecoli and caused renal failure, no problems since then  . CKD (chronic kidney disease) stage 3, GFR 30-59 ml/min (HCC) 10/16/2006   Qualifier: Diagnosis of  By: Jenny Reichmann MD, [place] 12/20/2006  . Complication of  anesthesia    awake during 2 surgeries  . CONTUSION, LOWER LEG 09/15/2008  . DEPRESSION 10/16/2006   no per pt  . Esophageal reflux 12/25/2006  . HYPERLIPIDEMIA 12/25/2006  . Hyperlipidemia   . HYPERTENSION 08/15/2006  . Hypertension   . HYPOTHYROIDISM 08/15/2006  . Kidney failure, acute ([place]) 2009   as a result of severe E Coli infection  . Left-sided carotid artery disease ([place])   . MIGRAINE HEADACHE 10/16/2006  . Obesity 10/16/2006  . OVARIAN CYST 12/20/2006  . Psoriasis 01/03/2011  . SHINGLES, HX OF 12/20/2006  . Stroke [facility])    3 strokes 2008-no deficits, only on ASA  . TRANSIENT ISCHEMIC ATTACKS, HX OF 12/20/2006  . VERTIGO 09/15/2007  . VITAMIN B12 DEFICIENCY 01/09/2007    Past Surgical History:  Procedure Laterality Date  . ABDOMINAL HYSTERECTOMY  02/2006  . ANTERIOR CERVICAL DECOMP/DISCECTOMY FUSION  2005  . CESAREAN SECTION    . CHOLECYSTECTOMY    . COLONOSCOPY    . ENDARTERECTOMY Left 08/25/2014   Procedure: LEFT CAROTID ENDARTERECTOMY WITH HEMASHIELD PATCH ANGIOPLASTY;  Surgeon: Elam Dutch, MD;  Location: [facility];  Service: Vascular;  Laterality: Left;  . NECK SURGERY  2005  . ROTATOR CUFF REPAIR Right 02/2014   Dr Veverly Fells    Allergies  Allergen Reactions  . Cefuroxime Axetil     REACTION: edema/swelling  . Ciprofloxacin Swelling  . Lipitor [  Atorvastatin] Other (See Comments)    Myalgia   . Lyrica [Pregabalin] Other (See Comments)    HA, confusion, swelling  . Gabapentin Rash    Family History  Problem Relation Age of Onset  . Stroke Father   . Heart disease Father   . Diabetes Father   . Lupus Mother   . Raynaud syndrome Mother   . Heart disease Mother   . Diabetes Sister   . Heart disease Brother   . Hypertension Brother   . Heart disease Brother   . Hypertension Brother   . Diabetes Other   . Hypertension Other   . Colon cancer Neg Hx   . Esophageal cancer Neg Hx   . Rectal cancer Neg Hx   . Stomach cancer Neg Hx      Social History    Social History  . Marital status: Married    Spouse name: N/A  . Number of children: N/A  . Years of education: N/A   Occupational History  . Not on file.   Social History Main Topics  . Smoking status: Former Research scientist (life sciences)  . Smokeless tobacco: Never Used     Comment: quit smoking over 30 years ago  . Alcohol use No  . Drug use: No  . Sexual activity: Not on file   Other Topics Concern  . Not on file   Social History Narrative  . No narrative on file     PHYSICAL EXAM:  VS: BP (!) 156/82 (BP Location: Right Arm, Patient Position: Sitting, Cuff Size: Normal)   Pulse 71   Temp (!) 97.5 F (36.4 C) (Oral)   Ht 5\' 2"  (1.575 m)   Wt 193 lb 1.3 oz (87.6 kg)   SpO2 98%   BMI 35.31 kg/m  Physical Exam Gen: NAD, alert, cooperative with exam, well-appearing ENT: normal lips, normal nasal mucosa,  Eye: normal EOM, normal conjunctiva and lids CV:  no edema, +2 pedal pulses, S1-S2   Resp: no accessory muscle use, non-labored, clear to auscultation GI: no masses or tenderness, no hernia, no suprapubic tenderness  Skin: no rashes, no areas of induration  Neuro: normal tone, normal sensation to touch Psych:  normal insight, alert and oriented MSK: Normal gait, normal strength, no CVA tenderness      ASSESSMENT & PLAN:   Urinary urgency Reports she feels like she is demonstrating symptoms of a urinary tract infection. Has a distant history of an infection severe enough to shatter kidney down area is also had some ongoing kidney issues. - Urinalysis and urine culture

## 2016-11-23 NOTE — Telephone Encounter (Signed)
Pt aware of lab results/faxed in Diflucan 150mg  #1/thx dmf

## 2016-11-23 NOTE — Telephone Encounter (Signed)
-----   Message from [name], MD sent at 11/23/2016  4:09 PM EDT ----- Please inform that her urine doesn't suggest an infection. Could consider treating with diflucan for a yeast infection.

## 2016-11-23 NOTE — Patient Instructions (Signed)
Thank you for coming in,   We will call you with results.  Please seek immediate care if you feel like her symptoms are getting progressively worse.   Please feel free to call with any questions or concerns at any time, at [phone]. --Dr. Raeford Razor

## 2016-11-24 DIAGNOSIS — R3915 Urgency of urination: Secondary | ICD-10-CM | POA: Insufficient documentation

## 2016-11-24 NOTE — Assessment & Plan Note (Signed)
Reports she feels like she is demonstrating symptoms of a urinary tract infection. Has a distant history of an infection severe enough to shatter kidney down area is also had some ongoing kidney issues. - Urinalysis and urine culture

## 2016-11-26 ENCOUNTER — Encounter: Payer: Self-pay | Admitting: Internal Medicine

## 2016-11-26 ENCOUNTER — Telehealth: Payer: Self-pay | Admitting: Family Medicine

## 2016-11-26 DIAGNOSIS — Z0000 Encounter for general adult medical examination without abnormal findings: Secondary | ICD-10-CM

## 2016-11-26 LAB — URINE CULTURE
MICRO NUMBER:: 81110231
SPECIMEN QUALITY:: ADEQUATE

## 2016-11-26 NOTE — Telephone Encounter (Signed)
Called patient and informed of urine culture. She has improved with the diflucan.   [name], MD [facility] 11/26/2016, 11:35 AM

## 2016-11-28 DIAGNOSIS — E669 Obesity, unspecified: Secondary | ICD-10-CM | POA: Diagnosis not present

## 2016-11-28 DIAGNOSIS — N183 Chronic kidney disease, stage 3 (moderate): Secondary | ICD-10-CM | POA: Diagnosis not present

## 2016-11-28 DIAGNOSIS — N390 Urinary tract infection, site not specified: Secondary | ICD-10-CM | POA: Diagnosis not present

## 2016-11-28 DIAGNOSIS — E559 Vitamin D deficiency, unspecified: Secondary | ICD-10-CM | POA: Diagnosis not present

## 2016-11-28 DIAGNOSIS — I639 Cerebral infarction, unspecified: Secondary | ICD-10-CM | POA: Diagnosis not present

## 2016-11-28 DIAGNOSIS — F17201 Nicotine dependence, unspecified, in remission: Secondary | ICD-10-CM | POA: Diagnosis not present

## 2016-11-28 DIAGNOSIS — I10 Essential (primary) hypertension: Secondary | ICD-10-CM | POA: Diagnosis not present

## 2016-12-03 ENCOUNTER — Ambulatory Visit (INDEPENDENT_AMBULATORY_CARE_PROVIDER_SITE_OTHER): Payer: Medicare Other

## 2016-12-03 ENCOUNTER — Ambulatory Visit (INDEPENDENT_AMBULATORY_CARE_PROVIDER_SITE_OTHER): Payer: Medicare Other | Admitting: Podiatry

## 2016-12-03 DIAGNOSIS — M21619 Bunion of unspecified foot: Secondary | ICD-10-CM

## 2016-12-03 DIAGNOSIS — Z9889 Other specified postprocedural states: Secondary | ICD-10-CM

## 2016-12-05 NOTE — Progress Notes (Signed)
   Subjective:  Patient presents today status post bunionectomy of the right foot. DOS: 10/30/16. She states she is doing well but reports some continued soreness of the area. She rates the pain 2/10. Resting the foot helps alleviate the pain. She has been given compression socks and an Ace bandage. She denies any new complaints at this time. She is here for further evaluation and treatment.    Past Medical History:  Diagnosis Date  . Abdominal pain, epigastric 12/20/2006  . ALLERGIC RHINITIS 12/25/2006  . Allergy   . ANXIETY 10/16/2006   no per pt  . Arthritis    right shoulder  . BACK PAIN 06/15/2008  . Blood transfusion without reported diagnosis   . BRADYCARDIA 05/04/2008  . Carpal tunnel syndrome 10/16/2006  . Cellulitis and abscess of other specified site 06/15/2008  . CHEST PAIN 09/08/2009  . Chronic low back pain   . CIRRHOSIS 10/16/2006  . CKD (chronic kidney disease)    2008- had Ecoli and caused renal failure, no problems since then  . CKD (chronic kidney disease) stage 3, GFR 30-59 ml/min (HCC) 10/16/2006   Qualifier: Diagnosis of  By: Jenny Reichmann MD, [place] 12/20/2006  . Complication of anesthesia    awake during 2 surgeries  . CONTUSION, LOWER LEG 09/15/2008  . DEPRESSION 10/16/2006   no per pt  . Esophageal reflux 12/25/2006  . HYPERLIPIDEMIA 12/25/2006  . Hyperlipidemia   . HYPERTENSION 08/15/2006  . Hypertension   . HYPOTHYROIDISM 08/15/2006  . Kidney failure, acute ([place]) 2009   as a result of severe E Coli infection  . Left-sided carotid artery disease ([place])   . MIGRAINE HEADACHE 10/16/2006  . Obesity 10/16/2006  . OVARIAN CYST 12/20/2006  . Psoriasis 01/03/2011  . SHINGLES, HX OF 12/20/2006  . Stroke [facility])    3 strokes 2008-no deficits, only on ASA  . TRANSIENT ISCHEMIC ATTACKS, HX OF 12/20/2006  . VERTIGO 09/15/2007  . VITAMIN B12 DEFICIENCY 01/09/2007      Objective/Physical Exam Skin incisions appear to be well coapted. No sign of infectious  process noted. No dehiscence. No active bleeding noted. Moderate edema noted to the surgical extremity.  Radiographic Exam:  Orthopedic hardware and osteotomies sites appear to be stable with routine healing.  Assessment: 1. s/p Bunionectomy of the right foot. DOS: 10/30/16.   Plan of Care:  1. Patient was evaluated. X-rays reviewed 2. Return to normal shoe gear.  3. Compression anklet dispensed. 4. Return to clinic in 2 weeks with Dr. Paulla Dolly.   Edrick Kins, DPM [facility]  Dr. Edrick Kins, [address]                                        [place], [place] [address]                Office [phone]  Fax [phone]

## 2016-12-06 ENCOUNTER — Encounter (HOSPITAL_COMMUNITY): Payer: BLUE CROSS/BLUE SHIELD

## 2016-12-06 ENCOUNTER — Ambulatory Visit: Payer: BLUE CROSS/BLUE SHIELD | Admitting: Vascular Surgery

## 2016-12-17 ENCOUNTER — Encounter: Payer: Self-pay | Admitting: Internal Medicine

## 2016-12-19 ENCOUNTER — Ambulatory Visit (INDEPENDENT_AMBULATORY_CARE_PROVIDER_SITE_OTHER): Payer: Medicare Other

## 2016-12-19 ENCOUNTER — Ambulatory Visit (INDEPENDENT_AMBULATORY_CARE_PROVIDER_SITE_OTHER): Payer: BLUE CROSS/BLUE SHIELD | Admitting: Podiatry

## 2016-12-19 DIAGNOSIS — M21619 Bunion of unspecified foot: Secondary | ICD-10-CM

## 2016-12-19 NOTE — Progress Notes (Signed)
Subjective:    Patient ID: Heather Dawson, female   DOB: 61 y.o.   MRN: [id number]   HPI patient states overall doing well but she admits she has been walking on her foot with no immobilization over the last couple weeks around the house and does walk on the inside of her foot    ROS      Objective:  Physical Exam neurovascular status intact with patient's right foot overall doing well with mild edema around the right hallux and good alignment of the first MPJ with no crepitus of the joint noted     Assessment:   Doing well with trauma to the right foot secondary to walking barefoot     Plan:    X-ray reviewed with patient and explained the importance of not walking on this without boot her shoe. Slight movement of the big toe but it should heal uneventfully and unfortunately was traumatized by the activity she exhibited    X-ray indicates that there is some stress on the Akin osteotomy site right but it should heal uneventfully this position as the fixation is in place and the bunion site looks excellent with good alignment and good reduction of the intermetatarsal angle

## 2017-01-16 ENCOUNTER — Other Ambulatory Visit: Payer: BLUE CROSS/BLUE SHIELD

## 2017-01-31 ENCOUNTER — Ambulatory Visit (HOSPITAL_COMMUNITY)
Admission: RE | Admit: 2017-01-31 | Discharge: 2017-01-31 | Disposition: A | Discharge status: Home / Self Care | Payer: Medicare Other | Source: Ambulatory Visit | Attending: Vascular Surgery | Admitting: Vascular Surgery

## 2017-01-31 ENCOUNTER — Ambulatory Visit: Payer: Medicare Other | Admitting: Vascular Surgery

## 2017-01-31 VITALS — BP 150/82 | HR 55 | Temp 97.4°F | Resp 16 | Ht 62.0 in | Wt 190.2 lb

## 2017-01-31 DIAGNOSIS — I6521 Occlusion and stenosis of right carotid artery: Secondary | ICD-10-CM | POA: Insufficient documentation

## 2017-01-31 DIAGNOSIS — Z48812 Encounter for surgical aftercare following surgery on the circulatory system: Secondary | ICD-10-CM

## 2017-01-31 DIAGNOSIS — I6522 Occlusion and stenosis of left carotid artery: Secondary | ICD-10-CM | POA: Diagnosis not present

## 2017-01-31 DIAGNOSIS — I6523 Occlusion and stenosis of bilateral carotid arteries: Secondary | ICD-10-CM

## 2017-01-31 LAB — VAS US CAROTID
LEFT ECA DIAS: -16 cm/s
Left CCA dist dias: 24 cm/s
Left CCA dist sys: 97 cm/s
Left CCA prox dias: 20 cm/s
Left CCA prox sys: 94 cm/s
Left ICA dist dias: -16 cm/s
Left ICA dist sys: -72 cm/s
Left ICA prox dias: -20 cm/s
Left ICA prox sys: -80 cm/s
RIGHT CCA MID DIAS: 16 cm/s
RIGHT ECA DIAS: -8 cm/s
Right CCA prox dias: 13 cm/s
Right CCA prox sys: 104 cm/s
Right cca dist sys: -52 cm/s

## 2017-01-31 NOTE — Progress Notes (Signed)
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  She underwent left carotid endarterectomy in 2016.  Denies any symptoms of TIA or amaurosis.  She is on aspirin and Mevacor.  She recently had a bunionectomy by Dr. Felisa Bonier and states that she is recovering well from this.  She denies any symptoms of claudication in her lower extremities.  Chronic medical problems include CKD 3, hyperlipidemia and hypertension all of which are currently controlled.  Review of systems: She denies shortness of breath.  She denies chest pain.  Past Medical History:  Diagnosis Date  . Abdominal pain, epigastric 12/20/2006  . ALLERGIC RHINITIS 12/25/2006  . Allergy   . ANXIETY 10/16/2006   no per pt  . Arthritis    right shoulder  . BACK PAIN 06/15/2008  . Blood transfusion without reported diagnosis   . BRADYCARDIA 05/04/2008  . Carpal tunnel syndrome 10/16/2006  . Cellulitis and abscess of other specified site 06/15/2008  . CHEST PAIN 09/08/2009  . Chronic low back pain   . CIRRHOSIS 10/16/2006  . CKD (chronic kidney disease)    2008- had Ecoli and caused renal failure, no problems since then  . CKD (chronic kidney disease) stage 3, GFR 30-59 ml/min (HCC) 10/16/2006   Qualifier: Diagnosis of  By: Jenny Reichmann MD, Home 12/20/2006  . Complication of anesthesia    awake during 2 surgeries  . CONTUSION, LOWER LEG 09/15/2008  . DEPRESSION 10/16/2006   no per pt  . Esophageal reflux 12/25/2006  . HYPERLIPIDEMIA 12/25/2006  . Hyperlipidemia   . HYPERTENSION 08/15/2006  . Hypertension   . HYPOTHYROIDISM 08/15/2006  . Kidney failure, acute ([place]) 2009   as a result of severe E Coli infection  . Left-sided carotid artery disease ([place])   . MIGRAINE HEADACHE 10/16/2006  . Obesity 10/16/2006  . OVARIAN CYST 12/20/2006  . Psoriasis 01/03/2011  . SHINGLES, HX OF 12/20/2006  . Stroke [facility])    3 strokes 2008-no deficits, only on ASA  . TRANSIENT ISCHEMIC ATTACKS, HX OF  12/20/2006  . VERTIGO 09/15/2007  . VITAMIN B12 DEFICIENCY 01/09/2007   Past Surgical History:  Procedure Laterality Date  . ABDOMINAL HYSTERECTOMY  02/2006  . ANTERIOR CERVICAL DECOMP/DISCECTOMY FUSION  2005  . CESAREAN SECTION    . CHOLECYSTECTOMY    . COLONOSCOPY    . ENDARTERECTOMY Left 08/25/2014   Procedure: LEFT CAROTID ENDARTERECTOMY WITH HEMASHIELD PATCH ANGIOPLASTY;  Surgeon: Elam Dutch, MD;  Location: [place];  Service: Vascular;  Laterality: Left;  . NECK SURGERY  2005  . ROTATOR CUFF REPAIR Right 02/2014   Dr Veverly Fells    Current Outpatient Medications on File Prior to Visit  Medication Sig Dispense Refill  . acetaminophen (TYLENOL) 500 MG tablet Take 1,000 mg by mouth every 8 (eight) hours as needed (pain).    Marland Kitchen amLODipine (NORVASC) 10 MG tablet Take 1 tablet (10 mg total) by mouth daily. 90 tablet 3  . aspirin 325 MG EC tablet Take 325 mg by mouth daily.      . calcium citrate-vitamin D 500-400 MG-UNIT chewable tablet Chew 2 tablets by mouth daily.    . Cholecalciferol (VITAMIN D3) 1000 units CAPS Take 2 capsules (2,000 Units total) by mouth daily. 60 capsule 0  . clobetasol (TEMOVATE) 0.05 % external solution   0  . diclofenac sodium (VOLTAREN) 1 % GEL Apply 4 g topically 4 (four) times daily as needed. 400 g 5  . fexofenadine (  ALLEGRA) 180 MG tablet Take 1 tablet (180 mg total) by mouth every other day. 45 tablet 3  . FLUoxetine (PROZAC) 20 MG tablet Take 1 tablet (20 mg total) by mouth daily. 90 tablet 3  . levothyroxine (SYNTHROID, LEVOTHROID) 112 MCG tablet Take 1 tablet (112 mcg total) by mouth daily. 90 tablet 3  . lisinopril (PRINIVIL,ZESTRIL) 20 MG tablet Take 1 tablet (20 mg total) by mouth daily. 90 tablet 3  . lovastatin (MEVACOR) 40 MG tablet Take 1 tablet (40 mg total) by mouth at bedtime. 90 tablet 3  . omeprazole (PRILOSEC) 20 MG capsule Take 1 capsule (20 mg total) by mouth daily. 90 capsule 3  . SUMAtriptan (IMITREX) 100 MG tablet TAKE 1 TABLET BY MOUTH  EVERY 2 HOURS AS NEEDED FOR MIGRAINE OR HEADACHE.  MAY REPEAT IN 2 HOURS IF HEADACHE PERSISTS OR RECURS. 10 tablet 5  . TURMERIC CURCUMIN PO Take 500 mg by mouth daily.     No current facility-administered medications on file prior to visit.     Physical exam:  Vitals:   01/31/17 1054 01/31/17 1057  BP: (!) 145/79 (!) 150/82  Pulse: (!) 55   Resp: 16   Temp: (!) 97.4 F (36.3 C)   TempSrc: Oral   SpO2: 97%   Weight: 190 lb 3.2 oz (86.3 kg)   Height: 5\' 2"  (1.575 m)     Neck: No carotid bruits  Neuro: Symmetric upper and lower extremity motor strength which is 5/5, no facial asymmetry  Chest: Clear to auscultation bilaterally  Cardiac: Regular rate and rhythm without murmur  Data: Patient had a carotid duplex exam today which shows less than 40% right internal carotid artery stenosis no significant left internal carotid artery stenosis unchanged from 2017.  Assessment: Patient is status post left carotid endarterectomy overall doing well.  Plan: She will continue her aspirin and her Mevacor.  She will have a follow-up carotid duplex scan in 1 year.  She will see our nurse practitioner for that office visit.  Ruta Hinds, MD [facility] Office: [phone] Pager: [phone]

## 2017-02-01 NOTE — Addendum Note (Signed)
Addended by: Lianne Cure A on: 02/01/2017 11:17 AM   Modules accepted: Orders

## 2017-02-05 ENCOUNTER — Ambulatory Visit: Payer: Medicare Other | Admitting: Family Medicine

## 2017-02-05 ENCOUNTER — Encounter: Payer: Self-pay | Admitting: Family Medicine

## 2017-02-05 VITALS — BP 132/78 | HR 65 | Temp 99.1°F | Ht 62.0 in | Wt 187.0 lb

## 2017-02-05 DIAGNOSIS — M25512 Pain in left shoulder: Secondary | ICD-10-CM

## 2017-02-05 MED ORDER — DICLOFENAC SODIUM 2 % TD SOLN: 1.0000 "application " | Freq: Two times a day (BID) | TRANSDERMAL | 2 refills | Status: DC

## 2017-02-05 NOTE — Progress Notes (Signed)
Heather Dawson - 61 y.o. female MRN [id number]  Date of birth: Jun 05, 1955  SUBJECTIVE:  Including CC & ROS.  Chief Complaint  Patient presents with  . Left shoulder pain    Heather Dawson is a 61 y.o. female that is today for evaluation for left shoulder pain. The pain is located anterior and the pain radiates dow towards her elbow. She denies previous injury to her shoulder.  The pain is described as an aching pain.  The onset of the pain was last night. She did take tylenol with no improvement in her pain. Denies previous injury or surgeries. Pain is worse when she raises her arm. Reports the pain was moderate to severe last by but has improved. Has not been taking any medications. Denies any bruising or swelling. Denies any significant weakness. Has pain with abduction.    Review of Systems  Constitutional: Negative for fever.  Respiratory: Negative for shortness of breath.   Cardiovascular: Negative for chest pain.  Musculoskeletal: Positive for myalgias. Negative for gait problem.  Skin: Negative for color change.  Neurological: Negative for weakness and numbness.  Hematological: Negative for adenopathy.  Psychiatric/Behavioral: Negative for agitation.    HISTORY: Past Medical, Surgical, Social, and Family History Reviewed & Updated per EMR.   Pertinent Historical Findings include:  Past Medical History:  Diagnosis Date  . Abdominal pain, epigastric 12/20/2006  . ALLERGIC RHINITIS 12/25/2006  . Allergy   . ANXIETY 10/16/2006   no per pt  . Arthritis    right shoulder  . BACK PAIN 06/15/2008  . Blood transfusion without reported diagnosis   . BRADYCARDIA 05/04/2008  . Carpal tunnel syndrome 10/16/2006  . Cellulitis and abscess of other specified site 06/15/2008  . CHEST PAIN 09/08/2009  . Chronic low back pain   . CIRRHOSIS 10/16/2006  . CKD (chronic kidney disease)    2008- had Ecoli and caused renal failure, no problems since then  . CKD (chronic kidney disease) stage 3, GFR 30-59  ml/min (HCC) 10/16/2006   Qualifier: Diagnosis of  By: Jenny Reichmann MD, [place] 12/20/2006  . Complication of anesthesia    awake during 2 surgeries  . CONTUSION, LOWER LEG 09/15/2008  . DEPRESSION 10/16/2006   no per pt  . Esophageal reflux 12/25/2006  . HYPERLIPIDEMIA 12/25/2006  . Hyperlipidemia   . HYPERTENSION 08/15/2006  . Hypertension   . HYPOTHYROIDISM 08/15/2006  . Kidney failure, acute ([place]) 2009   as a result of severe E Coli infection  . Left-sided carotid artery disease ([place])   . MIGRAINE HEADACHE 10/16/2006  . Obesity 10/16/2006  . OVARIAN CYST 12/20/2006  . Psoriasis 01/03/2011  . SHINGLES, HX OF 12/20/2006  . Stroke [facility])    3 strokes 2008-no deficits, only on ASA  . TRANSIENT ISCHEMIC ATTACKS, HX OF 12/20/2006  . VERTIGO 09/15/2007  . VITAMIN B12 DEFICIENCY 01/09/2007    Past Surgical History:  Procedure Laterality Date  . ABDOMINAL HYSTERECTOMY  02/2006  . ANTERIOR CERVICAL DECOMP/DISCECTOMY FUSION  2005  . CESAREAN SECTION    . CHOLECYSTECTOMY    . COLONOSCOPY    . ENDARTERECTOMY Left 08/25/2014   Procedure: LEFT CAROTID ENDARTERECTOMY WITH HEMASHIELD PATCH ANGIOPLASTY;  Surgeon: Elam Dutch, MD;  Location: [place];  Service: Vascular;  Laterality: Left;  . NECK SURGERY  2005  . ROTATOR CUFF REPAIR Right 02/2014   Dr Veverly Fells    Allergies  Allergen Reactions  . Cefuroxime Axetil     REACTION: edema/swelling  .  Ciprofloxacin Swelling  . Lipitor [Atorvastatin] Other (See Comments)    Myalgia   . Lyrica [Pregabalin] Other (See Comments)    HA, confusion, swelling  . Gabapentin Rash    Family History  Problem Relation Age of Onset  . Stroke Father   . Heart disease Father   . Diabetes Father   . Lupus Mother   . Raynaud syndrome Mother   . Heart disease Mother   . Diabetes Sister   . Heart disease Brother   . Hypertension Brother   . Heart disease Brother   . Hypertension Brother   . Diabetes Other   . Hypertension Other   . Colon  cancer Neg Hx   . Esophageal cancer Neg Hx   . Rectal cancer Neg Hx   . Stomach cancer Neg Hx      Social History   Socioeconomic History  . Marital status: Married    Spouse name: Not on file  . Number of children: Not on file  . Years of education: Not on file  . Highest education level: Not on file  Social Needs  . Financial resource strain: Not on file  . Food insecurity - worry: Not on file  . Food insecurity - inability: Not on file  . Transportation needs - medical: Not on file  . Transportation needs - non-medical: Not on file  Occupational History  . Not on file  Tobacco Use  . Smoking status: Former Research scientist (life sciences)  . Smokeless tobacco: Never Used  . Tobacco comment: quit smoking over 30 years ago  Substance and Sexual Activity  . Alcohol use: No  . Drug use: No  . Sexual activity: Not on file  Other Topics Concern  . Not on file  Social History Narrative  . Not on file     PHYSICAL EXAM:  VS: BP 132/78 (BP Location: Left Arm, Patient Position: Sitting, Cuff Size: Normal)   Pulse 65   Temp 99.1 F (37.3 C) (Oral)   Ht 5\' 2"  (1.575 m)   Wt 187 lb (84.8 kg)   SpO2 98%   BMI 34.20 kg/m  Physical Exam Gen: NAD, alert, cooperative with exam, well-appearing ENT: normal lips, normal nasal mucosa,  Eye: normal EOM, normal conjunctiva and lids CV:  no edema, +2 pedal pulses   Resp: no accessory muscle use, non-labored,  GI: no masses or tenderness, no hernia  Skin: no rashes, no areas of induration  Neuro: normal tone, normal sensation to touch Psych:  normal insight, alert and oriented MSK:  Left shoulder: Tenderness to palpation over the acromioclavicular joint. Normal passive range of motion in abduction and flexion. Normal strength resistance with internal and external rotation. Normal external rotation. Pain was Hawkin's testing. Pain with empty can testing. Normal grip strength. Neurovascularly intact.   Limited ultrasound: Left  shoulder:  Normal-appearing biceps tendon short axis and subscapularis and long axis. Has some findings suggestive of a subacromial bursitis. 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  Consent for procedure obtained. Time Out: Verified patient identification, verified procedure, site/side was marked, verified correct patient position,  special equipment/implants available, medications/allergies/relevent history reviewed, required imaging and test results available.  Performed.  The area was cleaned with iodine and alcohol swabs.    The Left AC joint was injected using 1 cc's of 40 mg Depomedrol and 1 cc's of 1% lidocaine with a 25 1 1/2" needle.  Ultrasound was used. Images were obtained in Transverse views showing the injection.    A sterile dressing was applied.  Patient did tolerate procedure well.        ASSESSMENT & PLAN:   Acute pain of left shoulder Pain is acute and exacerbated by her cooking yesterday. More likely she aggravated the arthritis in her [facility] joint. Does have findings of subacromial bursitis but this is likely more chronic.  - AC joint injection  - Counseled on home exercise program - Provided by [facility]  - If no improvement could consider a subacromial injection. If has component sugars tinnitus tendinopathy could also consider nitroglycerin patches

## 2017-02-05 NOTE — Patient Instructions (Signed)
Please try the exercises.  Please follow up with me in 4-6 weeks if your shoulder pain doesn't improve.

## 2017-02-06 DIAGNOSIS — M25512 Pain in left shoulder: Secondary | ICD-10-CM | POA: Insufficient documentation

## 2017-02-06 NOTE — Assessment & Plan Note (Signed)
Pain is acute and exacerbated by her cooking yesterday. More likely she aggravated the arthritis in her [facility] joint. Does have findings of subacromial bursitis but this is likely more chronic.  - AC joint injection  - Counseled on home exercise program - Provided by [facility]  - If no improvement could consider a subacromial injection. If has component sugars tinnitus tendinopathy could also consider nitroglycerin patches

## 2017-02-07 ENCOUNTER — Ambulatory Visit: Payer: Medicare Other | Admitting: Internal Medicine

## 2017-03-26 ENCOUNTER — Telehealth: Payer: Self-pay | Admitting: Internal Medicine

## 2017-03-26 NOTE — Telephone Encounter (Signed)
Spoke with patient, she stated that her medical records has cirrhosis listed as an issue that she has and stated that she previously spoke with Dr. Jenny Reichmann about getting it removed because he agreed that she never had that condition. She wanted me to print out a copy of her problem list and send it to her so she will be able to get life insurance coverage.

## 2017-03-26 NOTE — Telephone Encounter (Signed)
Copied from [place] [id number]. Topic: Quick Communication - See Telephone Encounter >> Mar 26, 2017 11:19 AM Robina Ade, Helene Kelp D wrote: CRM for notification. See Telephone encounter for: 03/26/17. Patient called and said that she needs to talk to Dr. Jenny Reichmann or his CMA about a medical condition that was put on her record for mistake and has not been removed. A denial is being done for her life insurance because of this. Please call patient back to see how this can be fixed, thanks.

## 2017-04-04 ENCOUNTER — Encounter: Payer: Self-pay | Admitting: Internal Medicine

## 2017-04-04 ENCOUNTER — Ambulatory Visit: Payer: Self-pay | Admitting: Internal Medicine

## 2017-04-04 ENCOUNTER — Telehealth: Payer: Self-pay | Admitting: Internal Medicine

## 2017-04-04 ENCOUNTER — Other Ambulatory Visit (INDEPENDENT_AMBULATORY_CARE_PROVIDER_SITE_OTHER): Payer: Medicare Other

## 2017-04-04 VITALS — BP 142/94 | HR 69 | Temp 98.1°F | Ht 62.0 in | Wt 191.0 lb

## 2017-04-04 DIAGNOSIS — R300 Dysuria: Secondary | ICD-10-CM

## 2017-04-04 DIAGNOSIS — R140 Abdominal distension (gaseous): Secondary | ICD-10-CM

## 2017-04-04 DIAGNOSIS — R21 Rash and other nonspecific skin eruption: Secondary | ICD-10-CM

## 2017-04-04 LAB — URINALYSIS, ROUTINE W REFLEX MICROSCOPIC
Bilirubin Urine: NEGATIVE
Ketones, ur: NEGATIVE
Nitrite: NEGATIVE
Specific Gravity, Urine: 1.02 (ref 1.000–1.030)
Total Protein, Urine: NEGATIVE
Urine Glucose: NEGATIVE
Urobilinogen, UA: 0.2 (ref 0.0–1.0)
pH: 6 (ref 5.0–8.0)

## 2017-04-04 MED ORDER — CEFTRIAXONE SODIUM 1 G IJ SOLR
1.0000 g | Freq: Once | INTRAMUSCULAR | Status: AC
  Administered 2017-04-04: 1 g via INTRAMUSCULAR

## 2017-04-04 MED ORDER — LEVOFLOXACIN 500 MG PO TABS: 500.0000 mg | ORAL_TABLET | Freq: Every day | ORAL | 0 refills | Status: AC

## 2017-04-04 MED ORDER — SULFAMETHOXAZOLE-TRIMETHOPRIM 800-160 MG PO TABS: 1.0000 | ORAL_TABLET | Freq: Two times a day (BID) | ORAL | 0 refills | Status: DC

## 2017-04-04 NOTE — Telephone Encounter (Signed)
Called pharmacy and let them know to only fill the bactrim. Levaquin was sent in by mistake.

## 2017-04-04 NOTE — Telephone Encounter (Signed)
Copied from [place]. Topic: Quick Communication - Rx Refill/Question >> Apr 04, 2017  4:18 PM Margot Ables wrote: Pharmacy received RX for 2 different abx, pt was only expecting one, please advise

## 2017-04-04 NOTE — Patient Instructions (Addendum)
You had the antibiotic shot today (rocephin)  Please take all new medication as prescribed - the Septra DS (not the levaquin that was accidentally sent, b/c of the cipro allergy)  OK to take the Probiotic as you have  Please continue all other medications as before, and refills have been done if requested.  Please have the pharmacy call with any other refills you may need.  Please continue your efforts at being more active, low cholesterol diet, and weight control.  You are otherwise up to date with prevention measures today.  Please keep your appointments with your specialists as you may have planned  Please go to the LAB in the Basement (turn left off the elevator) for the tests to be done today  You will be contacted by phone if any changes need to be made immediately.  Otherwise, you will receive a letter about your results with an explanation, but please check with MyChart first.

## 2017-04-04 NOTE — Assessment & Plan Note (Signed)
D/w pt  - c/w contact dermatitis, now already much improved, ok to cont as is and benadryl topical prn

## 2017-04-04 NOTE — Progress Notes (Signed)
Subjective:    Patient ID: Heather Dawson, female    DOB: Jan 28, 1956, 62 y.o.   MRN: [id number]  HPI  Here with 2 wks mild to mod constant dysuria and frequency and left flank pain but o/w denies urinary symptoms such a urgency, hematuria or n/v, fever, chills.  Also with diarrhea/loose stools intermittent mild for 1 mo with gas and bloating but  No blood, fever, n/v, or wt loss.  Also assoc with dysparuenia.  Has been trying probiotic but not sure if helping overall.  Pt denies chest pain, increased sob or doe, wheezing, orthopnea, PND, increased LE swelling, palpitations, dizziness or syncope.   Pt denies polydipsia, polyuria  No other interval hx or new complaints except recently had a rash to forearms with itching last wk after working in the yard, now fortunately resolved Past Medical History:  Diagnosis Date  . Abdominal pain, epigastric 12/20/2006  . ALLERGIC RHINITIS 12/25/2006  . Allergy   . ANXIETY 10/16/2006   no per pt  . Arthritis    right shoulder  . BACK PAIN 06/15/2008  . Blood transfusion without reported diagnosis   . BRADYCARDIA 05/04/2008  . Carpal tunnel syndrome 10/16/2006  . Cellulitis and abscess of other specified site 06/15/2008  . CHEST PAIN 09/08/2009  . Chronic low back pain   . CIRRHOSIS 10/16/2006  . CKD (chronic kidney disease)    2008- had Ecoli and caused renal failure, no problems since then  . CKD (chronic kidney disease) stage 3, GFR 30-59 ml/min (HCC) 10/16/2006   Qualifier: Diagnosis of  By: Jenny Reichmann MD, [place] 12/20/2006  . Complication of anesthesia    awake during 2 surgeries  . CONTUSION, LOWER LEG 09/15/2008  . DEPRESSION 10/16/2006   no per pt  . Esophageal reflux 12/25/2006  . HYPERLIPIDEMIA 12/25/2006  . Hyperlipidemia   . HYPERTENSION 08/15/2006  . Hypertension   . HYPOTHYROIDISM 08/15/2006  . Kidney failure, acute ([place]) 2009   as a result of severe E Coli infection  . Left-sided carotid artery disease ([place])   . MIGRAINE  HEADACHE 10/16/2006  . Obesity 10/16/2006  . OVARIAN CYST 12/20/2006  . Psoriasis 01/03/2011  . SHINGLES, HX OF 12/20/2006  . Stroke [facility])    3 strokes 2008-no deficits, only on ASA  . TRANSIENT ISCHEMIC ATTACKS, HX OF 12/20/2006  . VERTIGO 09/15/2007  . VITAMIN B12 DEFICIENCY 01/09/2007   Past Surgical History:  Procedure Laterality Date  . ABDOMINAL HYSTERECTOMY  02/2006  . ANTERIOR CERVICAL DECOMP/DISCECTOMY FUSION  2005  . CESAREAN SECTION    . CHOLECYSTECTOMY    . COLONOSCOPY    . ENDARTERECTOMY Left 08/25/2014   Procedure: LEFT CAROTID ENDARTERECTOMY WITH HEMASHIELD PATCH ANGIOPLASTY;  Surgeon: Elam Dutch, MD;  Location: [place];  Service: Vascular;  Laterality: Left;  . NECK SURGERY  2005  . ROTATOR CUFF REPAIR Right 02/2014   Dr Veverly Fells    reports that she has quit smoking. she has never used smokeless tobacco. She reports that she does not drink alcohol or use drugs. family history includes Diabetes in her father, other, and sister; Heart disease in her brother, brother, father, and mother; Hypertension in her brother, brother, and other; Lupus in her mother; Raynaud syndrome in her mother; Stroke in her father. Allergies  Allergen Reactions  . Cefuroxime Axetil     REACTION: edema/swelling  . Ciprofloxacin Swelling  . Lipitor [Atorvastatin] Other (See Comments)    Myalgia   . Lyrica [  Pregabalin] Other (See Comments)    HA, confusion, swelling  . Gabapentin Rash   Review of Systems  Constitutional: Negative for other unusual diaphoresis or sweats HENT: Negative for ear discharge or swelling Eyes: Negative for other worsening visual disturbances Respiratory: Negative for stridor or other swelling  Gastrointestinal: Negative for worsening distension or other blood Genitourinary: Negative for retention or other urinary change Musculoskeletal: Negative for other MSK pain or swelling Skin: Negative for color change or other new lesions Neurological: Negative for  worsening tremors and other numbness  Psychiatric/Behavioral: Negative for worsening agitation or other fatigue All other system neg per pt    Objective:   Physical Exam BP (!) 142/94   Pulse 69   Temp 98.1 F (36.7 C) (Oral)   Ht 5\' 2"  (1.575 m)   Wt 191 lb (86.6 kg)   SpO2 98%   BMI 34.93 kg/m  VS noted,  Constitutional: Pt appears in NAD HENT: Head: NCAT.  Right Ear: External ear normal.  Left Ear: External ear normal.  Eyes: . Pupils are equal, round, and reactive to light. Conjunctivae and EOM are normal Nose: without d/c or deformity Neck: Neck supple. Gross normal ROM Cardiovascular: Normal rate and regular rhythm.   Pulmonary/Chest: Effort normal and breath sounds without rales or wheezing.  Abd:  Soft, ND, + BS, no organomegaly with low mid abd tender without guarding or rebound, + left flank tender Neurological: Pt is alert. At baseline orientation, motor grossly intact Skin: Skin is warm. No rashes, other new lesions, no LE edema Psychiatric: Pt behavior is normal without agitation  No other exam findings    Assessment & Plan:

## 2017-04-06 NOTE — Assessment & Plan Note (Signed)
Likely uti with possible left pyelonephritis; for rocephin IM 1 gm, oral septra ds,  to f/u any worsening symptoms or concerns

## 2017-04-06 NOTE — Assessment & Plan Note (Signed)
Ok to cont probiotic,  to f/u any worsening symptoms or concerns

## 2017-04-07 LAB — URINE CULTURE
MICRO NUMBER:: 90199583
SPECIMEN QUALITY:: ADEQUATE

## 2017-04-08 ENCOUNTER — Ambulatory Visit: Payer: Self-pay | Admitting: *Deleted

## 2017-04-08 MED ORDER — ONDANSETRON HCL 4 MG PO TABS: 4.0000 mg | ORAL_TABLET | Freq: Three times a day (TID) | ORAL | 1 refills | Status: DC | PRN

## 2017-04-08 NOTE — Telephone Encounter (Signed)
Pt states severe nausea since starting antibiotics (Bactrim and Levaquin) 04/04/17 for UTI. No vomiting, no diarrhea, able to stay hydrated.  States Dr. Jenny Reichmann had offered to prescribe a medication for nausea.  Would like med called in;  [place], [place]  Reason for Disposition . Caller requesting a NON-URGENT new prescription or refill and triager unable to refill per unit policy  Answer Assessment - Initial Assessment Questions 1. SYMPTOMS: "Do you have any symptoms?"    Nausea, no vomiting, no diarrhea 2. SEVERITY: If symptoms are present, ask "Are they mild, moderate or severe?"     Severe  Protocols used: MEDICATION QUESTION CALL-A-AH

## 2017-04-08 NOTE — Telephone Encounter (Signed)
Ok, this is done 

## 2017-04-08 NOTE — Telephone Encounter (Signed)
Pt has been informed and expressed understanding.  

## 2017-04-08 NOTE — Telephone Encounter (Signed)
Spoke with patient, she is only taking the bactrim, however she would like a nausea medication called in to her pharmacy. Please advise.

## 2017-04-11 MED ORDER — NITROFURANTOIN MACROCRYSTAL 50 MG PO CAPS: 50.0000 mg | ORAL_CAPSULE | Freq: Four times a day (QID) | ORAL | 0 refills | Status: DC

## 2017-04-11 NOTE — Telephone Encounter (Signed)
Pt has been informed and expressed understanding.  

## 2017-04-11 NOTE — Addendum Note (Signed)
Addended by: Biagio Borg on: 04/11/2017 10:54 AM   Modules accepted: Orders

## 2017-04-11 NOTE — Telephone Encounter (Signed)
Pt states still "very nauseated"; no vomiting. Has 3 days left of antibiotics. States Zofran helped first 2 days only. Also reports UTI "not much better."  Still with burning on urination. Please advise.

## 2017-04-11 NOTE — Telephone Encounter (Signed)
The urine culture indicates she should be ok with taking the septra DS as this should work, but if she is not doing well we will need to change  Ok to try change to macrobid (though she watch for any worsening nausea)

## 2017-04-11 NOTE — Telephone Encounter (Signed)
Please advise 

## 2017-04-15 ENCOUNTER — Telehealth: Payer: Self-pay | Admitting: Internal Medicine

## 2017-04-15 NOTE — Telephone Encounter (Signed)
Ok to cont the zofran for nausea only for now, but to consider early office visit for any worsening symptoms or go to ED for IV treatment with effective meds we cannot give in pills or shots

## 2017-04-15 NOTE — Telephone Encounter (Signed)
Pt has been informed and expressed understanding.  

## 2017-04-15 NOTE — Telephone Encounter (Signed)
Copied from [place]. Topic: Quick Communication - See Telephone Encounter >> Apr 15, 2017  8:45 AM Ivar Drape wrote: CRM for notification. See Telephone encounter for:  04/15/17. Patient wants Dr. Jenny Reichmann to know that the antibiotic that was given to her for her UTI made her sick to the stomach.  Also the second medication that was given her, the Nitrofurantoin, gave her the shakes and she was gasping for breath.  So she only took one tablet.  Patient stated the UTI seemed to have cleared up, but she is still sick to her stomach and wants to know if she should come in before her scheduled appt on 04/23/17.  Please call her at [phone].

## 2017-04-15 NOTE — Telephone Encounter (Signed)
Please advise 

## 2017-04-23 ENCOUNTER — Other Ambulatory Visit: Payer: Self-pay | Admitting: Internal Medicine

## 2017-04-23 ENCOUNTER — Encounter: Payer: Self-pay | Admitting: Internal Medicine

## 2017-04-23 ENCOUNTER — Other Ambulatory Visit (INDEPENDENT_AMBULATORY_CARE_PROVIDER_SITE_OTHER): Payer: Medicare Other

## 2017-04-23 ENCOUNTER — Telehealth: Payer: Self-pay | Admitting: Internal Medicine

## 2017-04-23 ENCOUNTER — Telehealth: Payer: Self-pay

## 2017-04-23 ENCOUNTER — Ambulatory Visit: Payer: Medicare Other | Admitting: Internal Medicine

## 2017-04-23 VITALS — BP 124/86 | HR 74 | Temp 98.0°F | Ht 62.0 in | Wt 194.0 lb

## 2017-04-23 DIAGNOSIS — E119 Type 2 diabetes mellitus without complications: Secondary | ICD-10-CM | POA: Diagnosis not present

## 2017-04-23 DIAGNOSIS — E785 Hyperlipidemia, unspecified: Secondary | ICD-10-CM | POA: Diagnosis not present

## 2017-04-23 DIAGNOSIS — N183 Chronic kidney disease, stage 3 unspecified: Secondary | ICD-10-CM

## 2017-04-23 DIAGNOSIS — E039 Hypothyroidism, unspecified: Secondary | ICD-10-CM

## 2017-04-23 DIAGNOSIS — M25512 Pain in left shoulder: Secondary | ICD-10-CM | POA: Insufficient documentation

## 2017-04-23 DIAGNOSIS — Z23 Encounter for immunization: Secondary | ICD-10-CM | POA: Diagnosis not present

## 2017-04-23 DIAGNOSIS — Z0000 Encounter for general adult medical examination without abnormal findings: Secondary | ICD-10-CM

## 2017-04-23 DIAGNOSIS — Z0001 Encounter for general adult medical examination with abnormal findings: Secondary | ICD-10-CM | POA: Diagnosis not present

## 2017-04-23 LAB — BASIC METABOLIC PANEL
BUN: 34 mg/dL — ABNORMAL HIGH (ref 6–23)
CO2: 27 mEq/L (ref 19–32)
Calcium: 9.9 mg/dL (ref 8.4–10.5)
Chloride: 108 mEq/L (ref 96–112)
Creatinine, Ser: 1.45 mg/dL — ABNORMAL HIGH (ref 0.40–1.20)
GFR: 39.1 mL/min — ABNORMAL LOW (ref >60.00)
Glucose, Bld: 118 mg/dL — ABNORMAL HIGH (ref 70–99)
Potassium: 4.8 mEq/L (ref 3.5–5.1)
Sodium: 142 mEq/L (ref 135–145)

## 2017-04-23 LAB — CBC WITH DIFFERENTIAL/PLATELET
Basophils Absolute: 0 10*3/uL (ref 0.0–0.1)
Basophils Relative: 0.4 % (ref 0.0–3.0)
Eosinophils Absolute: 0.6 10*3/uL (ref 0.0–0.7)
Eosinophils Relative: 7.8 % — ABNORMAL HIGH (ref 0.0–5.0)
HCT: 36.8 % (ref 36.0–46.0)
Hemoglobin: 12.4 g/dL (ref 12.0–15.0)
Lymphocytes Relative: 22.9 % (ref 12.0–46.0)
Lymphs Abs: 1.9 10*3/uL (ref 0.7–4.0)
MCHC: 33.7 g/dL (ref 30.0–36.0)
MCV: 93.6 fl (ref 78.0–100.0)
Monocytes Absolute: 0.5 10*3/uL (ref 0.1–1.0)
Monocytes Relative: 6.2 % (ref 3.0–12.0)
Neutro Abs: 5.2 10*3/uL (ref 1.4–7.7)
Neutrophils Relative %: 62.7 % (ref 43.0–77.0)
Platelets: 227 10*3/uL (ref 150.0–400.0)
RBC: 3.93 Mil/uL (ref 3.87–5.11)
RDW: 12.8 % (ref 11.5–15.5)
WBC: 8.3 10*3/uL (ref 4.0–10.5)

## 2017-04-23 LAB — LIPID PANEL
Cholesterol: 195 mg/dL (ref 0–200)
HDL: 51.8 mg/dL (ref >39.00)
LDL Cholesterol: 116 mg/dL — ABNORMAL HIGH (ref 0–99)
NonHDL: 143.01
Total CHOL/HDL Ratio: 4
Triglycerides: 133 mg/dL (ref 0.0–149.0)
VLDL: 26.6 mg/dL (ref 0.0–40.0)

## 2017-04-23 LAB — HEPATIC FUNCTION PANEL
ALT: 18 U/L (ref 0–35)
AST: 13 U/L (ref 0–37)
Albumin: 4.3 g/dL (ref 3.5–5.2)
Alkaline Phosphatase: 68 U/L (ref 39–117)
Bilirubin, Direct: 0.1 mg/dL (ref 0.0–0.3)
Total Bilirubin: 0.4 mg/dL (ref 0.2–1.2)
Total Protein: 6.9 g/dL (ref 6.0–8.3)

## 2017-04-23 LAB — URINALYSIS, ROUTINE W REFLEX MICROSCOPIC
Bilirubin Urine: NEGATIVE
Hgb urine dipstick: NEGATIVE
Ketones, ur: NEGATIVE
Leukocytes, UA: NEGATIVE
Nitrite: NEGATIVE
RBC / HPF: NONE SEEN (ref 0–?)
Specific Gravity, Urine: 1.025 (ref 1.000–1.030)
Total Protein, Urine: NEGATIVE
Urine Glucose: NEGATIVE
Urobilinogen, UA: 0.2 (ref 0.0–1.0)
pH: 5.5 (ref 5.0–8.0)

## 2017-04-23 LAB — MICROALBUMIN / CREATININE URINE RATIO
Creatinine,U: 108 mg/dL
Microalb Creat Ratio: 3.1 mg/g (ref 0.0–30.0)
Microalb, Ur: 3.4 mg/dL — ABNORMAL HIGH (ref 0.0–1.9)

## 2017-04-23 LAB — TSH: TSH: 0.21 u[IU]/mL — ABNORMAL LOW (ref 0.35–4.50)

## 2017-04-23 LAB — HEMOGLOBIN A1C: Hgb A1c MFr Bld: 6.4 % (ref 4.6–6.5)

## 2017-04-23 MED ORDER — LISINOPRIL 20 MG PO TABS: 20.0000 mg | ORAL_TABLET | Freq: Every day | ORAL | 3 refills | Status: DC

## 2017-04-23 MED ORDER — FEXOFENADINE HCL 180 MG PO TABS: 180.0000 mg | ORAL_TABLET | ORAL | 3 refills | Status: DC

## 2017-04-23 MED ORDER — LOVASTATIN 40 MG PO TABS: 40.0000 mg | ORAL_TABLET | Freq: Every day | ORAL | 3 refills | Status: DC

## 2017-04-23 MED ORDER — AMLODIPINE BESYLATE 10 MG PO TABS: 10.0000 mg | ORAL_TABLET | Freq: Every day | ORAL | 3 refills | Status: DC

## 2017-04-23 MED ORDER — SUMATRIPTAN SUCCINATE 100 MG PO TABS: ORAL_TABLET | ORAL | 5 refills | Status: DC

## 2017-04-23 MED ORDER — OMEPRAZOLE 20 MG PO CPDR: 20.0000 mg | DELAYED_RELEASE_CAPSULE | Freq: Every day | ORAL | 3 refills | Status: DC

## 2017-04-23 MED ORDER — LEVOTHYROXINE SODIUM 100 MCG PO TABS: 100.0000 ug | ORAL_TABLET | Freq: Every day | ORAL | 3 refills | Status: DC

## 2017-04-23 MED ORDER — DICLOFENAC SODIUM 1 % TD GEL: 4.0000 g | Freq: Four times a day (QID) | TRANSDERMAL | 11 refills | Status: DC | PRN

## 2017-04-23 MED ORDER — LEVOTHYROXINE SODIUM 112 MCG PO TABS: 112.0000 ug | ORAL_TABLET | Freq: Every day | ORAL | 3 refills | Status: DC

## 2017-04-23 NOTE — Telephone Encounter (Signed)
Called pt, LVM   CRM Created

## 2017-04-23 NOTE — Progress Notes (Signed)
Subjective:    Patient ID: Heather Dawson, female    DOB: 1955-11-22, 62 y.o.   MRN: [id number]  HPI  Here for wellness and f/u;  Overall doing ok;  Pt denies Chest pain, worsening SOB, DOE, wheezing, orthopnea, PND, worsening LE edema, palpitations, dizziness or syncope, except for Also some worsening edema that goes away at night to bilat LE's.  Pt denies neurological change such as new headache, facial or extremity weakness.  Pt states overall good compliance with treatment and medications, good tolerability, and has been trying to follow appropriate diet.  Pt denies worsening depressive symptoms, suicidal ideation or panic. No fever, night sweats, wt loss, loss of appetite, or other constitutional symptoms.  Pt states good ability with ADL's, has low fall risk, home safety reviewed and adequate, no other significant changes in hearing or vision, and only occasionally active with exercise.  Denies urinary symptoms such as dysuria, frequency, urgency, flank pain, hematuria or n/v, fever, chills. Unfortuantely had GI intolerance to levaquin after 7 days, then 1 day macrobid with the letter assoc with diarrhea, sob and nausea.    Pt denies polydipsia, polyuria, or low sugar symptoms such as weakness or confusion improved with po intake.  Pt states overall good compliance with meds, trying to follow lower cholesterol, diabetic diet, wt overall stable but little exercise however, but has 2 low sugars with n/v resolved with eating.  Also did have a cortisone shot for left shoulder, better, now worse again, mild to mod, constant, worse to abduct.   Past Medical History:  Diagnosis Date  . Abdominal pain, epigastric 12/20/2006  . ALLERGIC RHINITIS 12/25/2006  . Allergy   . ANXIETY 10/16/2006   no per pt  . Arthritis    right shoulder  . BACK PAIN 06/15/2008  . Blood transfusion without reported diagnosis   . BRADYCARDIA 05/04/2008  . Carpal tunnel syndrome 10/16/2006  . Cellulitis and abscess of other  specified site 06/15/2008  . CHEST PAIN 09/08/2009  . Chronic low back pain   . CIRRHOSIS 10/16/2006  . CKD (chronic kidney disease)    2008- had Ecoli and caused renal failure, no problems since then  . CKD (chronic kidney disease) stage 3, GFR 30-59 ml/min (HCC) 10/16/2006   Qualifier: Diagnosis of  By: Jenny Reichmann MD, [place] 12/20/2006  . Complication of anesthesia    awake during 2 surgeries  . CONTUSION, LOWER LEG 09/15/2008  . DEPRESSION 10/16/2006   no per pt  . Esophageal reflux 12/25/2006  . HYPERLIPIDEMIA 12/25/2006  . Hyperlipidemia   . HYPERTENSION 08/15/2006  . Hypertension   . HYPOTHYROIDISM 08/15/2006  . Kidney failure, acute ([place]) 2009   as a result of severe E Coli infection  . Left-sided carotid artery disease ([place])   . MIGRAINE HEADACHE 10/16/2006  . Obesity 10/16/2006  . OVARIAN CYST 12/20/2006  . Psoriasis 01/03/2011  . SHINGLES, HX OF 12/20/2006  . Stroke [facility])    3 strokes 2008-no deficits, only on ASA  . TRANSIENT ISCHEMIC ATTACKS, HX OF 12/20/2006  . VERTIGO 09/15/2007  . VITAMIN B12 DEFICIENCY 01/09/2007   Past Surgical History:  Procedure Laterality Date  . ABDOMINAL HYSTERECTOMY  02/2006  . ANTERIOR CERVICAL DECOMP/DISCECTOMY FUSION  2005  . CESAREAN SECTION    . CHOLECYSTECTOMY    . COLONOSCOPY    . ENDARTERECTOMY Left 08/25/2014   Procedure: LEFT CAROTID ENDARTERECTOMY WITH HEMASHIELD PATCH ANGIOPLASTY;  Surgeon: Elam Dutch, MD;  Location[place];  Service: Vascular;  Laterality: Left;  . NECK SURGERY  2005  . ROTATOR CUFF REPAIR Right 02/2014   Dr Veverly Fells    reports that she has quit smoking. she has never used smokeless tobacco. She reports that she does not drink alcohol or use drugs. family history includes Diabetes in her father, other, and sister; Heart disease in her brother, brother, father, and mother; Hypertension in her brother, brother, and other; Lupus in her mother; Raynaud syndrome in her mother; Stroke in her  father. Allergies  Allergen Reactions  . Cefuroxime Axetil     REACTION: edema/swelling  . Ciprofloxacin Swelling  . Levaquin [Levofloxacin In D5w] Other (See Comments)    GI upset  . Lipitor [Atorvastatin] Other (See Comments)    Myalgia   . Lyrica [Pregabalin] Other (See Comments)    HA, confusion, swelling  . Macrodantin [Nitrofurantoin Macrocrystal] Other (See Comments)    GI upset, diarrhea and sob  . Gabapentin Rash   Current Outpatient Medications on File Prior to Visit  Medication Sig Dispense Refill  . acetaminophen (TYLENOL) 500 MG tablet Take 1,000 mg by mouth every 8 (eight) hours as needed (pain).    Marland Kitchen aspirin 325 MG EC tablet Take 325 mg by mouth daily.      . calcium citrate-vitamin D 500-400 MG-UNIT chewable tablet Chew 2 tablets by mouth daily.    . Cholecalciferol (VITAMIN D3) 1000 units CAPS Take 2 capsules (2,000 Units total) by mouth daily. 60 capsule 0  . clobetasol (TEMOVATE) 0.05 % external solution   0  . TURMERIC CURCUMIN PO Take 500 mg by mouth daily.     No current facility-administered medications on file prior to visit.    Review of Systems Constitutional: Negative for other unusual diaphoresis, sweats, appetite or weight changes HENT: Negative for other worsening hearing loss, ear pain, facial swelling, mouth sores or neck stiffness.   Eyes: Negative for other worsening pain, redness or other visual disturbance.  Respiratory: Negative for other stridor or swelling Cardiovascular: Negative for other palpitations or other chest pain  Gastrointestinal: Negative for worsening diarrhea or loose stools, blood in stool, distention or other pain Genitourinary: Negative for hematuria, flank pain or other change in urine volume.  Musculoskeletal: Negative for myalgias or other joint swelling.  Skin: Negative for other color change, or other wound or worsening drainage.  Neurological: Negative for other syncope or numbness. Hematological: Negative for other  adenopathy or swelling Psychiatric/Behavioral: Negative for hallucinations, other worsening agitation, SI, self-injury, or new decreased concentration] All other system neg per pt    Objective:   Physical Exam BP 124/86   Pulse 74   Temp 98 F (36.7 C) (Oral)   Ht 5\' 2"  (1.575 m)   Wt 194 lb (88 kg)   SpO2 97%   BMI 35.48 kg/m  VS noted,  Constitutional: Pt is oriented to person, place, and time. Appears well-developed and well-nourished, in no significant distress and comfortable Head: Normocephalic and atraumatic  Eyes: Conjunctivae and EOM are normal. Pupils are equal, round, and reactive to light Right Ear: External ear normal without discharge Left Ear: External ear normal without discharge Nose: Nose without discharge or deformity Mouth/Throat: Oropharynx is without other ulcerations and moist  Neck: Normal range of motion. Neck supple. No JVD present. No tracheal deviation present or significant neck LA or mass Cardiovascular: Normal rate, regular rhythm, normal heart sounds and intact distal pulses.   Pulmonary/Chest: WOB normal and breath sounds without rales or wheezing  Abdominal:  Soft. Bowel sounds are normal. NT. No HSM  Musculoskeletal: Normal range of motion. Exhibits no edema Lymphadenopathy: Has no other cervical adenopathy.  Left shoulder: tender subacromial  Neurological: Pt is alert and oriented to person, place, and time. Pt has normal reflexes. No cranial nerve deficit. Motor grossly intact, Gait intact Skin: Skin is warm and dry. No rash noted or new ulcerations Psychiatric:  Has normal mood and affect. Behavior is normal without agitation No other exam findings    Assessment & Plan:

## 2017-04-23 NOTE — Telephone Encounter (Signed)
Pt called in returning the call for lab results.   CB: [phone]

## 2017-04-23 NOTE — Patient Instructions (Addendum)
You had the shingles shot today, and the pneumovax pneumonia shot today  Please take all new medication as prescribed - the voltaren gel for pain  Please continue all other medications as before, and refills have been done if requested.  Please have the pharmacy call with any other refills you may need.  Please continue your efforts at being more active, low cholesterol diet, and weight control.  You are otherwise up to date with prevention measures today.  Please keep your appointments with your specialists as you may have planned - renal every 4 months  Please go to the LAB in the Basement (turn left off the elevator) for the tests to be done today  You will be contacted by phone if any changes need to be made immediately.  Otherwise, you will receive a letter about your results with an explanation, but please check with MyChart first.  Please remember to sign up for MyChart if you have not done so, as this will be important to you in the future with finding out test results, communicating by private email, and scheduling acute appointments online when needed.  Please return in 6 months, or sooner if needed, with Lab testing done 3-5 days before

## 2017-04-23 NOTE — Telephone Encounter (Signed)
-----   Message from Biagio Borg, MD sent at 04/23/2017 12:48 PM EST ----- Left message on MyChart, pt to cont same tx except  The test results show that your current treatment is OK, except the TSH is slightly low, meaning that you are taking slightly too much thyroid medication.  We should decrease the thyroid medication from 112 to 100 mcg per day, and plan to recheck with your next visit.  I will send a new prescription, and you should hear from the office as well.Redmond Baseman to please inform pt, I will do rx

## 2017-04-24 LAB — HIV ANTIBODY (ROUTINE TESTING W REFLEX): HIV 1&2 Ab, 4th Generation: NONREACTIVE

## 2017-04-24 NOTE — Telephone Encounter (Signed)
See result note.  

## 2017-04-26 ENCOUNTER — Encounter: Payer: Self-pay | Admitting: Internal Medicine

## 2017-04-26 NOTE — Assessment & Plan Note (Signed)
Uncontrolled, declines change in statin due to prior experience with myalgias with other

## 2017-04-26 NOTE — Assessment & Plan Note (Signed)
stable overall by history and exam, recent data reviewed with pt, and pt to continue medical treatment as before,  to f/u any worsening symptoms or concerns Lab Results  Component Value Date   HGBA1C 6.4 04/23/2017

## 2017-04-26 NOTE — Assessment & Plan Note (Signed)
Stable, to cont to follow with renal

## 2017-04-26 NOTE — Assessment & Plan Note (Signed)

## 2017-04-26 NOTE — Assessment & Plan Note (Addendum)
C/w bursiitis, for volt gel prn, consider sport medicine referral  In addition to the time spent performing CPE, I spent an additional 25 minutes face to face,in which greater than 50% of this time was spent in counseling and coordination of care for patient's acute illness as documented, including the differential dx, treatment, further evaluation and other management of acute left shoulder pain, hypothyroidism, HTN, HLD, CKD and DM

## 2017-04-26 NOTE — Assessment & Plan Note (Signed)
stable overall by history and exam, recent data reviewed with pt, and pt to continue medical treatment as before,  to f/u any worsening symptoms or concerns, for f/u TSH with lab

## 2017-05-21 DIAGNOSIS — E1122 Type 2 diabetes mellitus with diabetic chronic kidney disease: Secondary | ICD-10-CM | POA: Diagnosis not present

## 2017-05-21 DIAGNOSIS — I129 Hypertensive chronic kidney disease with stage 1 through stage 4 chronic kidney disease, or unspecified chronic kidney disease: Secondary | ICD-10-CM | POA: Diagnosis not present

## 2017-05-21 DIAGNOSIS — N183 Chronic kidney disease, stage 3 (moderate): Secondary | ICD-10-CM | POA: Diagnosis not present

## 2017-05-21 DIAGNOSIS — E559 Vitamin D deficiency, unspecified: Secondary | ICD-10-CM | POA: Diagnosis not present

## 2017-07-18 ENCOUNTER — Ambulatory Visit: Payer: Medicare Other | Admitting: Internal Medicine

## 2017-07-18 ENCOUNTER — Encounter: Payer: Self-pay | Admitting: Internal Medicine

## 2017-07-18 VITALS — BP 142/92 | HR 87 | Temp 98.3°F | Ht 62.0 in | Wt 202.0 lb

## 2017-07-18 DIAGNOSIS — I10 Essential (primary) hypertension: Secondary | ICD-10-CM | POA: Diagnosis not present

## 2017-07-18 DIAGNOSIS — E119 Type 2 diabetes mellitus without complications: Secondary | ICD-10-CM | POA: Diagnosis not present

## 2017-07-18 DIAGNOSIS — R21 Rash and other nonspecific skin eruption: Secondary | ICD-10-CM

## 2017-07-18 MED ORDER — TRIAMCINOLONE ACETONIDE 0.1 % EX CREA: 1.0000 "application " | TOPICAL_CREAM | Freq: Two times a day (BID) | CUTANEOUS | 0 refills | Status: DC

## 2017-07-18 NOTE — Progress Notes (Signed)
Subjective:    Patient ID: Heather Dawson, female    DOB: July 28, 1955, 62 y.o.   MRN: [id number]  HPI  Here with c/o rash onset x 2 wks, intermitent mild with itching and burning that seems to come and go only to arms below the elbows.  Today is a good day with minimal rash that is faint and hard to discern but has iphone pictures of arms yesterday with large slightly raised erythema mostly to anterior arms below the elbows.  Cannot think of any contact like allergen, no other rash to suggest drug or infection.  Pt denies chest pain, increased sob or doe, wheezing, orthopnea, PND, increased LE swelling, palpitations, dizziness or syncope.   Pt denies polydipsia, polyuria.  No other interval hx or new complaint Past Medical History:  Diagnosis Date  . Abdominal pain, epigastric 12/20/2006  . ALLERGIC RHINITIS 12/25/2006  . Allergy   . ANXIETY 10/16/2006   no per pt  . Arthritis    right shoulder  . BACK PAIN 06/15/2008  . Blood transfusion without reported diagnosis   . BRADYCARDIA 05/04/2008  . Carpal tunnel syndrome 10/16/2006  . Cellulitis and abscess of other specified site 06/15/2008  . CHEST PAIN 09/08/2009  . Chronic low back pain   . CIRRHOSIS 10/16/2006  . CKD (chronic kidney disease)    2008- had Ecoli and caused renal failure, no problems since then  . CKD (chronic kidney disease) stage 3, GFR 30-59 ml/min (HCC) 10/16/2006   Qualifier: Diagnosis of  By: Jenny Reichmann MD, [place] 12/20/2006  . Complication of anesthesia    awake during 2 surgeries  . CONTUSION, LOWER LEG 09/15/2008  . DEPRESSION 10/16/2006   no per pt  . Esophageal reflux 12/25/2006  . HYPERLIPIDEMIA 12/25/2006  . Hyperlipidemia   . HYPERTENSION 08/15/2006  . Hypertension   . HYPOTHYROIDISM 08/15/2006  . Kidney failure, acute ([place]) 2009   as a result of severe E Coli infection  . Left-sided carotid artery disease ([place])   . MIGRAINE HEADACHE 10/16/2006  . Obesity 10/16/2006  . OVARIAN CYST 12/20/2006  .  Psoriasis 01/03/2011  . SHINGLES, HX OF 12/20/2006  . Stroke [facility])    3 strokes 2008-no deficits, only on ASA  . TRANSIENT ISCHEMIC ATTACKS, HX OF 12/20/2006  . VERTIGO 09/15/2007  . VITAMIN B12 DEFICIENCY 01/09/2007   Past Surgical History:  Procedure Laterality Date  . ABDOMINAL HYSTERECTOMY  02/2006  . ANTERIOR CERVICAL DECOMP/DISCECTOMY FUSION  2005  . CESAREAN SECTION    . CHOLECYSTECTOMY    . COLONOSCOPY    . ENDARTERECTOMY Left 08/25/2014   Procedure: LEFT CAROTID ENDARTERECTOMY WITH HEMASHIELD PATCH ANGIOPLASTY;  Surgeon: Elam Dutch, MD;  Location: [place];  Service: Vascular;  Laterality: Left;  . NECK SURGERY  2005  . ROTATOR CUFF REPAIR Right 02/2014   Dr Veverly Fells    reports that she has quit smoking. She has never used smokeless tobacco. She reports that she does not drink alcohol or use drugs. family history includes Diabetes in her father, other, and sister; Heart disease in her brother, brother, father, and mother; Hypertension in her brother, brother, and other; Lupus in her mother; Raynaud syndrome in her mother; Stroke in her father. Allergies  Allergen Reactions  . Cefuroxime Axetil     REACTION: edema/swelling  . Ciprofloxacin Swelling  . Levaquin [Levofloxacin In D5w] Other (See Comments)    GI upset  . Lipitor [Atorvastatin] Other (See Comments)  Myalgia   . Lyrica [Pregabalin] Other (See Comments)    HA, confusion, swelling  . Macrodantin [Nitrofurantoin Macrocrystal] Other (See Comments)    GI upset, diarrhea and sob  . Gabapentin Rash   Current Outpatient Medications on File Prior to Visit  Medication Sig Dispense Refill  . acetaminophen (TYLENOL) 500 MG tablet Take 1,000 mg by mouth every 8 (eight) hours as needed (pain).    Marland Kitchen amLODipine (NORVASC) 10 MG tablet Take 1 tablet (10 mg total) by mouth daily. 90 tablet 3  . aspirin 325 MG EC tablet Take 325 mg by mouth daily.      . calcium citrate-vitamin D 500-400 MG-UNIT chewable tablet Chew 2 tablets  by mouth daily.    . Cholecalciferol (VITAMIN D3) 1000 units CAPS Take 2 capsules (2,000 Units total) by mouth daily. 60 capsule 0  . clobetasol (TEMOVATE) 0.05 % external solution   0  . diclofenac sodium (VOLTAREN) 1 % GEL Apply 4 g topically 4 (four) times daily as needed. 400 g 11  . fexofenadine (ALLEGRA) 180 MG tablet Take 1 tablet (180 mg total) by mouth every other day. 45 tablet 3  . levothyroxine (SYNTHROID, LEVOTHROID) 100 MCG tablet Take 1 tablet (100 mcg total) by mouth daily before breakfast. 90 tablet 3  . lisinopril (PRINIVIL,ZESTRIL) 20 MG tablet Take 1 tablet (20 mg total) by mouth daily. 90 tablet 3  . lovastatin (MEVACOR) 40 MG tablet Take 1 tablet (40 mg total) by mouth at bedtime. 90 tablet 3  . omeprazole (PRILOSEC) 20 MG capsule Take 1 capsule (20 mg total) by mouth daily. 90 capsule 3  . SUMAtriptan (IMITREX) 100 MG tablet TAKE 1 TABLET BY MOUTH EVERY 2 HOURS AS NEEDED FOR MIGRAINE OR HEADACHE.  MAY REPEAT IN 2 HOURS IF HEADACHE PERSISTS OR RECURS. 10 tablet 5  . TURMERIC CURCUMIN PO Take 500 mg by mouth daily.     No current facility-administered medications on file prior to visit.    Review of Systems  Constitutional: Negative for other unusual diaphoresis or sweats HENT: Negative for ear discharge or swelling Eyes: Negative for other worsening visual disturbances Respiratory: Negative for stridor or other swelling  Gastrointestinal: Negative for worsening distension or other blood Genitourinary: Negative for retention or other urinary change Musculoskeletal: Negative for other MSK pain or swelling Skin: Negative for color change or other new lesions Neurological: Negative for worsening tremors and other numbness  Psychiatric/Behavioral: Negative for worsening agitation or other fatigue All other system neg per pt    Objective:   Physical Exam BP (!) 142/92   Pulse 87   Temp 98.3 F (36.8 C) (Oral)   Ht 5\' 2"  (1.575 m)   Wt 202 lb (91.6 kg)   SpO2 97%    BMI 36.95 kg/m  VS noted,  Constitutional: Pt appears in NAD HENT: Head: NCAT.  Right Ear: External ear normal.  Left Ear: External ear normal.  Eyes: . Pupils are equal, round, and reactive to light. Conjunctivae and EOM are normal Nose: without d/c or deformity Neck: Neck supple. Gross normal ROM Cardiovascular: Normal rate and regular rhythm.   Pulmonary/Chest: Effort normal and breath sounds without rales or wheezing.  Abd:  Soft, NT, ND, + BS, no organomegaly Neurological: Pt is alert. At baseline orientation, motor grossly intact Skin: Skin is warm. + faint nontender erythem rash nonraised to anterior forearms without tender, fever, no other new lesions, no LE edema Psychiatric: Pt behavior is normal without agitation  No other exam  findings       Assessment & Plan:

## 2017-07-18 NOTE — Patient Instructions (Signed)
Please take all new medication as prescribed - the cream as needed  You will be contacted regarding the referral for: Allergy  Please continue all other medications as before, and refills have been done if requested.  Please have the pharmacy call with any other refills you may need.  Please keep your appointments with your specialists as you may have planned

## 2017-07-20 NOTE — Assessment & Plan Note (Signed)
Etiology unclear, not likely infectious, possible contact related though cause is elusive, for triam cr prn asd, refer allergy as per pt request

## 2017-07-20 NOTE — Assessment & Plan Note (Signed)
BP Readings from Last 3 Encounters:  07/18/17 (!) 142/92  04/23/17 124/86  04/04/17 (!) 142/94   Possible reactive today, declines med change, cont same tx, cont to monitor at home

## 2017-07-20 NOTE — Assessment & Plan Note (Signed)
Lab Results  Component Value Date   HGBA1C 6.4 04/23/2017  stable overall by history and exam, recent data reviewed with pt, and pt to continue medical treatment as before,  to f/u any worsening symptoms or concerns

## 2017-08-13 DIAGNOSIS — R21 Rash and other nonspecific skin eruption: Secondary | ICD-10-CM | POA: Diagnosis not present

## 2017-08-13 DIAGNOSIS — J309 Allergic rhinitis, unspecified: Secondary | ICD-10-CM | POA: Diagnosis not present

## 2017-08-13 DIAGNOSIS — L299 Pruritus, unspecified: Secondary | ICD-10-CM | POA: Diagnosis not present

## 2017-08-13 DIAGNOSIS — J3089 Other allergic rhinitis: Secondary | ICD-10-CM | POA: Diagnosis not present

## 2017-08-26 ENCOUNTER — Telehealth: Payer: Self-pay | Admitting: Internal Medicine

## 2017-08-26 NOTE — Telephone Encounter (Signed)
New pharmacy has been added to chart. Will send meds when requested.

## 2017-08-26 NOTE — Telephone Encounter (Signed)
Copied from [place] [phone]. Topic: Quick Communication - See Telephone Encounter >> Aug 26, 2017  1:45 PM [name], NT wrote: CRM for notification. See Telephone encounter for: 08/26/17. Pt calling and states that [facility] closed in June and she is now going to be using [facility], [place] [phone] (Phone) [phone] (Fax) and that she is needing her meds sent over to them so that when they are ready for a refill she may have them refilled. Needs to be a 90 day supply. She states she does not need any refilled right now but need the order and approval sent to [facility] so that when she does needs her meds refilled she can.

## 2017-08-27 DIAGNOSIS — H1032 Unspecified acute conjunctivitis, left eye: Secondary | ICD-10-CM | POA: Diagnosis not present

## 2017-08-29 ENCOUNTER — Ambulatory Visit: Payer: Medicare Other | Admitting: Allergy & Immunology

## 2017-09-02 MED ORDER — AMLODIPINE BESYLATE 10 MG PO TABS: 10.0000 mg | ORAL_TABLET | Freq: Every day | ORAL | 1 refills | Status: DC

## 2017-09-02 MED ORDER — OMEPRAZOLE 20 MG PO CPDR: 20.0000 mg | DELAYED_RELEASE_CAPSULE | Freq: Every day | ORAL | 1 refills | Status: DC

## 2017-09-02 MED ORDER — LEVOTHYROXINE SODIUM 100 MCG PO TABS: 100.0000 ug | ORAL_TABLET | Freq: Every day | ORAL | 1 refills | Status: DC

## 2017-09-02 NOTE — Addendum Note (Signed)
Addended by: Juliet Rude on: 09/02/2017 08:57 AM   Modules accepted: Orders

## 2017-09-04 ENCOUNTER — Ambulatory Visit: Payer: Self-pay | Admitting: Internal Medicine

## 2017-09-04 NOTE — Telephone Encounter (Signed)
Noted.  [place]

## 2017-09-04 NOTE — Telephone Encounter (Signed)
I returned her call.   She has been having diarrhea since Saturday.    "Everything I eat goes straight through me".   I'm drinking 2-3 20 oz bottles of water a day to keep hydrated.  She mentioned before she started having diarrhea on Saturday she had 2 episodes of blood on the toilet tissue but none since.   No hemorrhoids in her history.    I scheduled her with Jodi Mourning, NP for 09/05/17 at 2:20 PM.   Dr. [name] was booked today along with the other providers.      Reason for Disposition . [1] MODERATE diarrhea (e.g., 4-6 times / day more than normal) AND [2] present > 48 hours (2 days)  Answer Assessment - Initial Assessment Questions 1. DIARRHEA SEVERITY: "How bad is the diarrhea?" "How many extra stools have you had in the past 24 hours than normal?"    - NO DIARRHEA (SCALE 0)   - MILD (SCALE 1-3): Few loose or mushy BMs; increase of 1-3 stools over normal daily number of stools; mild increase in ostomy output.   -  MODERATE (SCALE 4-7): Increase of 4-6 stools daily over normal; moderate increase in ostomy output. * SEVERE (SCALE 8-10; OR 'WORST POSSIBLE'): Increase of 7 or more stools daily over normal; moderate increase in ostomy output; incontinence.     3 this morning.   Mushy to water. 2. ONSET: "When did the diarrhea begin?"      Saturday I woke up with it. 3. BM CONSISTENCY: "How loose or watery is the diarrhea?"      Mushy to watery.    Having abd pain. 4. VOMITING: "Are you also vomiting?" If so, ask: "How many times in the past 24 hours?"      No    Nausea but no vomiting. 5. ABDOMINAL PAIN: "Are you having any abdominal pain?" If yes: "What does it feel like?" (e.g., crampy, dull, intermittent, constant)      Yes 6. ABDOMINAL PAIN SEVERITY: If present, ask: "How bad is the pain?"  (e.g., Scale 1-10; mild, moderate, or severe)   - MILD (1-3): doesn't interfere with normal activities, abdomen soft and not tender to touch    - MODERATE (4-7): interferes with normal  activities or awakens from sleep, tender to touch    - SEVERE (8-10): excruciating pain, doubled over, unable to do any normal activities       Yesterday worst day 6 on pain scale.   Today uncomfortable pain.    7. ORAL INTAKE: If vomiting, "Have you been able to drink liquids?" "How much fluids have you had in the past 24 hours?"     At least 2-3 bottles of water.    8. HYDRATION: "Any signs of dehydration?" (e.g., dry mouth [not just dry lips], too weak to stand, dizziness, new weight loss) "When did you last urinate?"     Weak.   9. EXPOSURE: "Have you traveled to a foreign country recently?" "Have you been exposed to anyone with diarrhea?" "Could you have eaten any food that was spoiled?"     Not much appetite.   No exposures or travel. 10. ANTIBIOTIC USE: "Are you taking antibiotics now or have you taken antibiotics in the past 2 months?"       No 11. OTHER SYMPTOMS: "Do you have any other symptoms?" (e.g., fever, blood in stool)       Blood in stools before got diarrhea twice before diarrhea started.   Red bloody stools  on paper twice.    12. PREGNANCY: "Is there any chance you are pregnant?" "When was your last menstrual period?"       N/A  Protocols used: DIARRHEA-A-AH

## 2017-09-05 ENCOUNTER — Ambulatory Visit: Payer: Medicare Other | Admitting: Family

## 2017-09-05 ENCOUNTER — Other Ambulatory Visit (INDEPENDENT_AMBULATORY_CARE_PROVIDER_SITE_OTHER): Payer: Medicare Other

## 2017-09-05 ENCOUNTER — Ambulatory Visit (INDEPENDENT_AMBULATORY_CARE_PROVIDER_SITE_OTHER)
Admission: RE | Admit: 2017-09-05 | Discharge: 2017-09-05 | Disposition: A | Discharge status: Home / Self Care | Payer: Medicare Other | Source: Ambulatory Visit | Attending: Family | Admitting: Family

## 2017-09-05 ENCOUNTER — Encounter: Payer: Self-pay | Admitting: Family

## 2017-09-05 VITALS — BP 128/88 | HR 73 | Temp 98.3°F | Ht 62.0 in | Wt 195.1 lb

## 2017-09-05 DIAGNOSIS — R197 Diarrhea, unspecified: Secondary | ICD-10-CM | POA: Diagnosis not present

## 2017-09-05 LAB — CBC WITH DIFFERENTIAL/PLATELET
Basophils Absolute: 0 10*3/uL (ref 0.0–0.1)
Basophils Relative: 0.3 % (ref 0.0–3.0)
Eosinophils Absolute: 0.2 10*3/uL (ref 0.0–0.7)
Eosinophils Relative: 2.4 % (ref 0.0–5.0)
HCT: 42.3 % (ref 36.0–46.0)
Hemoglobin: 14.1 g/dL (ref 12.0–15.0)
Lymphocytes Relative: 20 % (ref 12.0–46.0)
Lymphs Abs: 1.7 10*3/uL (ref 0.7–4.0)
MCHC: 33.3 g/dL (ref 30.0–36.0)
MCV: 94.4 fl (ref 78.0–100.0)
Monocytes Absolute: 0.8 10*3/uL (ref 0.1–1.0)
Monocytes Relative: 8.7 % (ref 3.0–12.0)
Neutro Abs: 6 10*3/uL (ref 1.4–7.7)
Neutrophils Relative %: 68.6 % (ref 43.0–77.0)
Platelets: 217 10*3/uL (ref 150.0–400.0)
RBC: 4.48 Mil/uL (ref 3.87–5.11)
RDW: 13 % (ref 11.5–15.5)
WBC: 8.7 10*3/uL (ref 4.0–10.5)

## 2017-09-05 LAB — COMPREHENSIVE METABOLIC PANEL
ALT: 21 U/L (ref 0–35)
AST: 13 U/L (ref 0–37)
Albumin: 4.4 g/dL (ref 3.5–5.2)
Alkaline Phosphatase: 87 U/L (ref 39–117)
BUN: 39 mg/dL — ABNORMAL HIGH (ref 6–23)
CO2: 28 mEq/L (ref 19–32)
Calcium: 9.9 mg/dL (ref 8.4–10.5)
Chloride: 103 mEq/L (ref 96–112)
Creatinine, Ser: 1.91 mg/dL — ABNORMAL HIGH (ref 0.40–1.20)
GFR: 28.41 mL/min — ABNORMAL LOW (ref >60.00)
Glucose, Bld: 113 mg/dL — ABNORMAL HIGH (ref 70–99)
Potassium: 4.6 mEq/L (ref 3.5–5.1)
Sodium: 138 mEq/L (ref 135–145)
Total Bilirubin: 0.4 mg/dL (ref 0.2–1.2)
Total Protein: 7.2 g/dL (ref 6.0–8.3)

## 2017-09-05 MED ORDER — METRONIDAZOLE 500 MG PO TABS: 500.0000 mg | ORAL_TABLET | Freq: Three times a day (TID) | ORAL | 0 refills | Status: DC

## 2017-09-05 NOTE — Progress Notes (Signed)
Heather Dawson is a 62 y.o. female with the following history as recorded in EpicCare:  Patient Active Problem List   Diagnosis Date Noted  . Left shoulder pain 04/23/2017  . Dysuria 04/04/2017  . Bloating symptom 04/04/2017  . Acute pain of left shoulder 02/06/2017  . Urinary urgency 11/24/2016  . Rash 10/05/2016  . Bunion of right foot 09/22/2016  . Trigger middle finger of left hand 09/22/2016  . Arthritis of hand, degenerative 09/22/2016  . Left foot pain 02/22/2016  . Pain and swelling of right lower leg 02/22/2016  . Neck pain 02/01/2016  . Postop carotid endarterectomy surveillance, encounter for 12/01/2015  . Cough 04/27/2015  . Left carotid artery stenosis 08/25/2014  . Left-sided carotid artery disease ([place]) 08/10/2014  . Stenosis of right carotid artery 07/27/2014  . Chronic low back pain 07/14/2014  . 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 Dizziness and giddiness 11/06/2013  . GERD (gastroesophageal reflux disease) 11/06/2013  . Influenza 02/21/2013  . Paronychia 12/30/2012  . Encounter for long-term (current) use of high-risk medication 03/02/2011  . Psoriasis 01/03/2011  . Encounter for well adult exam with abnormal findings 12/30/2010  . CHEST PAIN 09/08/2009  . LEG CRAMPS 06/20/2009  . HEMATOCHEZIA 03/21/2009  . GANGLION CYST, TENDON SHEATH 11/18/2008  . BACK PAIN 06/15/2008  . TRANSIENT ISCHEMIC ATTACK 10/14/2007  . ESOPHAGITIS 03/13/2007  . VITAMIN B12 DEFICIENCY 01/09/2007  . Diabetes ([place]) 12/25/2006  . Hyperlipidemia 12/25/2006  . Allergic rhinitis 12/25/2006  . Esophageal reflux 12/25/2006  . COMMON MIGRAINE 12/20/2006  . OVARIAN CYST 12/20/2006  . Other malaise and fatigue 12/20/2006  . SHINGLES, HX OF 12/20/2006  . CHOLERA, D/T VIBRIO CHOLERAE EL TOR 10/16/2006  . Morbid obesity ([place]) 10/16/2006  . ANXIETY 10/16/2006  . DEPRESSION 10/16/2006  . Carpal tunnel syndrome 10/16/2006  . CKD (chronic kidney disease) stage 3, GFR 30-59 ml/min (HCC)  10/16/2006  . Hypothyroidism 08/15/2006  . Essential hypertension 08/15/2006    Current Outpatient Medications  Medication Sig Dispense Refill  . acetaminophen (TYLENOL) 500 MG tablet Take 1,000 mg by mouth every 8 (eight) hours as needed (pain).    Marland Kitchen amLODipine (NORVASC) 10 MG tablet Take 1 tablet (10 mg total) by mouth daily. 90 tablet 1  . aspirin 325 MG EC tablet Take 325 mg by mouth daily.      . calcium citrate-vitamin D 500-400 MG-UNIT chewable tablet Chew 2 tablets by mouth daily.    . Cholecalciferol (VITAMIN D3) 1000 units CAPS Take 2 capsules (2,000 Units total) by mouth daily. 60 capsule 0  . clobetasol (TEMOVATE) 0.05 % external solution   0  . diclofenac sodium (VOLTAREN) 1 % GEL Apply 4 g topically 4 (four) times daily as needed. 400 g 11  . fexofenadine (ALLEGRA) 180 MG tablet Take 1 tablet (180 mg total) by mouth every other day. 45 tablet 3  . levothyroxine (SYNTHROID, LEVOTHROID) 100 MCG tablet Take 1 tablet (100 mcg total) by mouth daily before breakfast. 90 tablet 1  . lisinopril (PRINIVIL,ZESTRIL) 20 MG tablet Take 1 tablet (20 mg total) by mouth daily. 90 tablet 3  . lovastatin (MEVACOR) 40 MG tablet Take 1 tablet (40 mg total) by mouth at bedtime. 90 tablet 3  . omeprazole (PRILOSEC) 20 MG capsule Take 1 capsule (20 mg total) by mouth daily. 90 capsule 1  . SUMAtriptan (IMITREX) 100 MG tablet TAKE 1 TABLET BY MOUTH EVERY 2 HOURS AS NEEDED FOR MIGRAINE OR HEADACHE.  MAY REPEAT IN 2 HOURS IF HEADACHE  PERSISTS OR RECURS. 10 tablet 5  . triamcinolone cream (KENALOG) 0.1 % Apply 1 application topically 2 (two) times daily. 30 g 0  . TURMERIC CURCUMIN PO Take 500 mg by mouth daily.    . metroNIDAZOLE (FLAGYL) 500 MG tablet Take 1 tablet (500 mg total) by mouth 3 (three) times daily. 21 tablet 0   No current facility-administered medications for this visit.     Allergies: Cefuroxime axetil; Ciprofloxacin; Levaquin [levofloxacin in d5w]; Lipitor [atorvastatin]; Lyrica  [pregabalin]; Macrodantin [nitrofurantoin macrocrystal]; and Gabapentin  Past Medical History:  Diagnosis Date  . Abdominal pain, epigastric 12/20/2006  . ALLERGIC RHINITIS 12/25/2006  . Allergy   . ANXIETY 10/16/2006   no per pt  . Arthritis    right shoulder  . BACK PAIN 06/15/2008  . Blood transfusion without reported diagnosis   . BRADYCARDIA 05/04/2008  . Carpal tunnel syndrome 10/16/2006  . Cellulitis and abscess of other specified site 06/15/2008  . CHEST PAIN 09/08/2009  . Chronic low back pain   . CIRRHOSIS 10/16/2006  . CKD (chronic kidney disease)    2008- had Ecoli and caused renal failure, no problems since then  . CKD (chronic kidney disease) stage 3, GFR 30-59 ml/min (HCC) 10/16/2006   Qualifier: Diagnosis of  By: Jenny Reichmann MD, [place] 12/20/2006  . Complication of anesthesia    awake during 2 surgeries  . CONTUSION, LOWER LEG 09/15/2008  . DEPRESSION 10/16/2006   no per pt  . Esophageal reflux 12/25/2006  . HYPERLIPIDEMIA 12/25/2006  . Hyperlipidemia   . HYPERTENSION 08/15/2006  . Hypertension   . HYPOTHYROIDISM 08/15/2006  . Kidney failure, acute ([place]) 2009   as a result of severe E Coli infection  . Left-sided carotid artery disease ([place])   . MIGRAINE HEADACHE 10/16/2006  . Obesity 10/16/2006  . OVARIAN CYST 12/20/2006  . Psoriasis 01/03/2011  . SHINGLES, HX OF 12/20/2006  . Stroke [facility])    3 strokes 2008-no deficits, only on ASA  . TRANSIENT ISCHEMIC ATTACKS, HX OF 12/20/2006  . VERTIGO 09/15/2007  . VITAMIN B12 DEFICIENCY 01/09/2007    Past Surgical History:  Procedure Laterality Date  . ABDOMINAL HYSTERECTOMY  02/2006  . ANTERIOR CERVICAL DECOMP/DISCECTOMY FUSION  2005  . CESAREAN SECTION    . CHOLECYSTECTOMY    . COLONOSCOPY    . ENDARTERECTOMY Left 08/25/2014   Procedure: LEFT CAROTID ENDARTERECTOMY WITH HEMASHIELD PATCH ANGIOPLASTY;  Surgeon: Elam Dutch, MD;  Location: [facility];  Service: Vascular;  Laterality: Left;  . NECK SURGERY  2005   . ROTATOR CUFF REPAIR Right 02/2014   Dr Veverly Fells    Family History  Problem Relation Age of Onset  . Stroke Father   . Heart disease Father   . Diabetes Father   . Lupus Mother   . Raynaud syndrome Mother   . Heart disease Mother   . Diabetes Sister   . Heart disease Brother   . Hypertension Brother   . Heart disease Brother   . Hypertension Brother   . Diabetes Other   . Hypertension Other   . Colon cancer Neg Hx   . Esophageal cancer Neg Hx   . Rectal cancer Neg Hx   . Stomach cancer Neg Hx     Social History   Tobacco Use  . Smoking status: Former Research scientist (life sciences)  . Smokeless tobacco: Never Used  . Tobacco comment: quit smoking over 30 years ago  Substance Use Topics  . Alcohol use: No  Subjective:  Patient started with sudden onset of diarrhea on Saturday; + chronic nausea; no vomiting; no fever; no recent travel/ no sick contacts at home; denies any blood in the diarrhea but did see some bright red bleeding a few days prior; admits that did feel constipated when she saw the bright red blood; used Pepto Bismol yesterday with no benefit; has lost 7 pounds since her OV at the end of May; no recent antibiotic use; no new medications or supplement;  Objective:  Vitals:   09/05/17 1532  BP: 128/88  Pulse: 73  Temp: 98.3 F (36.8 C)  TempSrc: Oral  SpO2: 98%  Weight: 195 lb 1.9 oz (88.5 kg)  Height: 5' 2"  (1.575 m)    General: Well developed, well nourished, in no acute distress  Skin : Warm and dry.  Head: Normocephalic and atraumatic  Eyes: Sclera and conjunctiva clear; pupils round and reactive to light; extraocular movements intact  Ears: External normal; canals clear; tympanic membranes normal  Oropharynx: Pink, supple. No suspicious lesions  Neck: Supple without thyromegaly, adenopathy  Lungs: Respirations unlabored; clear to auscultation bilaterally without wheeze, rales, rhonchi  CVS exam: normal rate, regular rhythm,  Abdomen: Soft; nontender; nondistended;  normoactive bowel sounds; no masses or hepatosplenomegaly  Neurologic: Alert and oriented; speech intact; face symmetrical; moves all extremities well; CNII-XII intact without focal deficit   Assessment:  1. Diarrhea, unspecified type     Plan:  Concern for infection and/or dehydration; update labs today including CBC, CMP and abdominal X-ray; start Flagyl 500 mg tid x 7 days; she will take home stool culture kit but will not do collection yet; encouraged fluids with Gatorade, Ginger Ale/ BRAT diet; follow-up to be determined.   No follow-ups on file.  Orders Placed This Encounter  Procedures  . Stool C-Diff Toxin Assay    Standing Status:   Future    Standing Expiration Date:   09/05/2018  . Stool Culture    Standing Status:   Future    Standing Expiration Date:   09/05/2018  . DG Abd 2 Views    Standing Status:   Future    Number of Occurrences:   1    Standing Expiration Date:   11/07/2018    Order Specific Question:   Reason for Exam (SYMPTOM  OR DIAGNOSIS REQUIRED)    Answer:   diarrhea    Order Specific Question:   Is patient pregnant?    Answer:   No    Order Specific Question:   Preferred imaging location?    Answer:   [facility]    Order Specific Question:   Radiology Contrast Protocol - do NOT remove file path    Answer:   [email or web address]  . CBC w/Diff    Standing Status:   Future    Number of Occurrences:   1    Standing Expiration Date:   09/05/2018  . Comp Met (CMET)    Standing Status:   Future    Number of Occurrences:   1    Standing Expiration Date:   09/05/2018    Requested Prescriptions   Signed Prescriptions Disp Refills  . metroNIDAZOLE (FLAGYL) 500 MG tablet 21 tablet 0    Sig: Take 1 tablet (500 mg total) by mouth 3 (three) times daily.

## 2017-09-05 NOTE — Patient Instructions (Signed)
Food Choices to Help Relieve Diarrhea, Adult  When you have diarrhea, the foods you eat and your eating habits are very important. Choosing the right foods and drinks can help:  · Relieve diarrhea.  · Replace lost fluids and nutrients.  · Prevent dehydration.    What general guidelines should I follow?  Relieving diarrhea  · Choose foods with less than 2 g or .07 oz. of fiber per serving.  · Limit fats to less than 8 tsp (38 g or 1.34 oz.) a day.  · Avoid the following:  ? Foods and beverages sweetened with high-fructose corn syrup, honey, or sugar alcohols such as xylitol, sorbitol, and mannitol.  ? Foods that contain a lot of fat or sugar.  ? Fried, greasy, or spicy foods.  ? High-fiber grains, breads, and cereals.  ? Raw fruits and vegetables.  · Eat foods that are rich in probiotics. These foods include dairy products such as yogurt and fermented milk products. They help increase healthy bacteria in the stomach and intestines (gastrointestinal tract, or GI tract).  · If you have lactose intolerance, avoid dairy products. These may make your diarrhea worse.  · Take medicine to help stop diarrhea (antidiarrheal medicine) only as told by your health care provider.  Replacing nutrients  · Eat small meals or snacks every 3–4 hours.  · Eat bland foods, such as white rice, toast, or baked potato, until your diarrhea starts to get better. Gradually reintroduce nutrient-rich foods as tolerated or as told by your health care provider. This includes:  ? Well-cooked protein foods.  ? Peeled, seeded, and soft-cooked fruits and vegetables.  ? Low-fat dairy products.  · Take vitamin and mineral supplements as told by your health care provider.  Preventing dehydration    · Start by sipping water or a special solution to prevent dehydration (oral rehydration solution, ORS). Urine that is clear or pale yellow means that you are getting enough fluid.  · Try to drink at least 8–10 cups of fluid each day to help replace lost  fluids.  · You may add other liquids in addition to water, such as clear juice or decaffeinated sports drinks, as tolerated or as told by your health care provider.  · Avoid drinks with caffeine, such as coffee, tea, or soft drinks.  · Avoid alcohol.  What foods are recommended?  The items listed may not be a complete list. Talk with your health care provider about what dietary choices are best for you.  Grains  White rice. White, French, or pita breads (fresh or toasted), including plain rolls, buns, or bagels. White pasta. Saltine, soda, or graham crackers. Pretzels. Low-fiber cereal. Cooked cereals made with water (such as cornmeal, farina, or cream cereals). Plain muffins. Matzo. Melba toast. Zwieback.  Vegetables  Potatoes (without the skin). Most well-cooked and canned vegetables without skins or seeds. Tender lettuce.  Fruits  Apple sauce. Fruits canned in juice. Cooked apricots, cherries, grapefruit, peaches, pears, or plums. Fresh bananas and cantaloupe.  Meats and other protein foods  Baked or boiled chicken. Eggs. Tofu. Fish. Seafood. Smooth nut butters. Ground or well-cooked tender beef, ham, veal, lamb, pork, or poultry.  Dairy  Plain yogurt, kefir, and unsweetened liquid yogurt. Lactose-free milk, buttermilk, skim milk, or soy milk. Low-fat or nonfat hard cheese.  Beverages  Water. Low-calorie sports drinks. Fruit juices without pulp. Strained tomato and vegetable juices. Decaffeinated teas. Sugar-free beverages not sweetened with sugar alcohols. Oral rehydration solutions, if approved by your health care   provider.  Seasoning and other foods  Bouillon, broth, or soups made from recommended foods.  What foods are not recommended?  The items listed may not be a complete list. Talk with your health care provider about what dietary choices are best for you.  Grains  Whole grain, whole wheat, bran, or rye breads, rolls, pastas, and crackers. Wild or brown rice. Whole grain or bran cereals. Barley. Oats  and oatmeal. Corn tortillas or taco shells. Granola. Popcorn.  Vegetables  Raw vegetables. Fried vegetables. Cabbage, broccoli, Brussels sprouts, artichokes, baked beans, beet greens, corn, kale, legumes, peas, sweet potatoes, and yams. Potato skins. Cooked spinach and cabbage.  Fruits  Dried fruit, including raisins and dates. Raw fruits. Stewed or dried prunes. Canned fruits with syrup.  Meat and other protein foods  Fried or fatty meats. Deli meats. Chunky nut butters. Nuts and seeds. Beans and lentils. Bacon. Hot dogs. Sausage.  Dairy  High-fat cheeses. Whole milk, chocolate milk, and beverages made with milk, such as milk shakes. Half-and-half. Cream. sour cream. Ice cream.  Beverages  Caffeinated beverages (such as coffee, tea, soda, or energy drinks). Alcoholic beverages. Fruit juices with pulp. Prune juice. Soft drinks sweetened with high-fructose corn syrup or sugar alcohols. High-calorie sports drinks.  Fats and oils  Butter. Cream sauces. Margarine. Salad oils. Plain salad dressings. Olives. Avocados. Mayonnaise.  Sweets and desserts  Sweet rolls, doughnuts, and sweet breads. Sugar-free desserts sweetened with sugar alcohols such as xylitol and sorbitol.  Seasoning and other foods  Honey. Hot sauce. Chili powder. Gravy. Cream-based or milk-based soups. Pancakes and waffles.  Summary  · When you have diarrhea, the foods you eat and your eating habits are very important.  · Make sure you get at least 8–10 cups of fluid each day, or enough to keep your urine clear or pale yellow.  · Eat bland foods and gradually reintroduce healthy, nutrient-rich foods as tolerated, or as told by your health care provider.  · Avoid high-fiber, fried, greasy, or spicy foods.  This information is not intended to replace advice given to you by your health care provider. Make sure you discuss any questions you have with your health care provider.  Document Released: 04/28/2003 Document Revised: 02/03/2016 Document  Reviewed: 02/03/2016  Elsevier Interactive Patient Education © 2018 [employer]

## 2017-09-05 NOTE — Telephone Encounter (Signed)
Patient was scheduled for 2:20 pm today but cancelled appointment.

## 2017-09-06 ENCOUNTER — Other Ambulatory Visit: Payer: Self-pay | Admitting: Family

## 2017-09-06 DIAGNOSIS — R7989 Other specified abnormal findings of blood chemistry: Secondary | ICD-10-CM

## 2017-09-09 ENCOUNTER — Other Ambulatory Visit (INDEPENDENT_AMBULATORY_CARE_PROVIDER_SITE_OTHER): Payer: Medicare Other

## 2017-09-09 ENCOUNTER — Encounter (HOSPITAL_COMMUNITY): Payer: Self-pay | Admitting: *Deleted

## 2017-09-09 ENCOUNTER — Emergency Department (HOSPITAL_COMMUNITY)
Admission: EM | Admit: 2017-09-09 | Discharge: 2017-09-10 | Disposition: A | Discharge status: Home / Self Care | Payer: Medicare Other | Attending: Emergency Medicine | Admitting: Emergency Medicine

## 2017-09-09 ENCOUNTER — Telehealth: Payer: Self-pay | Admitting: Internal Medicine

## 2017-09-09 ENCOUNTER — Other Ambulatory Visit: Payer: Self-pay

## 2017-09-09 DIAGNOSIS — R7989 Other specified abnormal findings of blood chemistry: Secondary | ICD-10-CM

## 2017-09-09 DIAGNOSIS — N183 Chronic kidney disease, stage 3 (moderate): Secondary | ICD-10-CM | POA: Diagnosis not present

## 2017-09-09 DIAGNOSIS — E1122 Type 2 diabetes mellitus with diabetic chronic kidney disease: Secondary | ICD-10-CM | POA: Insufficient documentation

## 2017-09-09 DIAGNOSIS — Z8673 Personal history of transient ischemic attack (TIA), and cerebral infarction without residual deficits: Secondary | ICD-10-CM | POA: Insufficient documentation

## 2017-09-09 DIAGNOSIS — E039 Hypothyroidism, unspecified: Secondary | ICD-10-CM | POA: Insufficient documentation

## 2017-09-09 DIAGNOSIS — E860 Dehydration: Secondary | ICD-10-CM | POA: Diagnosis not present

## 2017-09-09 DIAGNOSIS — I129 Hypertensive chronic kidney disease with stage 1 through stage 4 chronic kidney disease, or unspecified chronic kidney disease: Secondary | ICD-10-CM | POA: Diagnosis not present

## 2017-09-09 DIAGNOSIS — Z79899 Other long term (current) drug therapy: Secondary | ICD-10-CM | POA: Diagnosis not present

## 2017-09-09 DIAGNOSIS — Z7982 Long term (current) use of aspirin: Secondary | ICD-10-CM | POA: Diagnosis not present

## 2017-09-09 DIAGNOSIS — Z9049 Acquired absence of other specified parts of digestive tract: Secondary | ICD-10-CM | POA: Insufficient documentation

## 2017-09-09 DIAGNOSIS — F329 Major depressive disorder, single episode, unspecified: Secondary | ICD-10-CM | POA: Insufficient documentation

## 2017-09-09 DIAGNOSIS — F419 Anxiety disorder, unspecified: Secondary | ICD-10-CM | POA: Insufficient documentation

## 2017-09-09 DIAGNOSIS — Z87891 Personal history of nicotine dependence: Secondary | ICD-10-CM | POA: Diagnosis not present

## 2017-09-09 LAB — URINALYSIS, ROUTINE W REFLEX MICROSCOPIC
Bilirubin Urine: NEGATIVE
Glucose, UA: NEGATIVE mg/dL
Hgb urine dipstick: NEGATIVE
Ketones, ur: NEGATIVE mg/dL
Nitrite: NEGATIVE
Protein, ur: NEGATIVE mg/dL
Specific Gravity, Urine: 1.012 (ref 1.005–1.030)
pH: 5 (ref 5.0–8.0)

## 2017-09-09 LAB — CBC
HCT: 37.8 % (ref 36.0–46.0)
Hemoglobin: 12.2 g/dL (ref 12.0–15.0)
MCH: 31.1 pg (ref 26.0–34.0)
MCHC: 32.3 g/dL (ref 30.0–36.0)
MCV: 96.4 fL (ref 78.0–100.0)
Platelets: 229 10*3/uL (ref 150–400)
RBC: 3.92 MIL/uL (ref 3.87–5.11)
RDW: 12.5 % (ref 11.5–15.5)
WBC: 8.8 10*3/uL (ref 4.0–10.5)

## 2017-09-09 LAB — BASIC METABOLIC PANEL
BUN: 27 mg/dL — ABNORMAL HIGH (ref 6–23)
CO2: 25 mEq/L (ref 19–32)
Calcium: 9.1 mg/dL (ref 8.4–10.5)
Chloride: 106 mEq/L (ref 96–112)
Creatinine, Ser: 1.89 mg/dL — ABNORMAL HIGH (ref 0.40–1.20)
GFR: 28.76 mL/min — ABNORMAL LOW (ref >60.00)
Glucose, Bld: 131 mg/dL — ABNORMAL HIGH (ref 70–99)
Potassium: 4.5 mEq/L (ref 3.5–5.1)
Sodium: 141 mEq/L (ref 135–145)

## 2017-09-09 LAB — COMPREHENSIVE METABOLIC PANEL
ALT: 27 U/L (ref 0–44)
AST: 21 U/L (ref 15–41)
Albumin: 4 g/dL (ref 3.5–5.0)
Alkaline Phosphatase: 71 U/L (ref 38–126)
Anion gap: 8 (ref 5–15)
BUN: 28 mg/dL — ABNORMAL HIGH (ref 6–20)
CO2: 23 mmol/L (ref 22–32)
Calcium: 9.2 mg/dL (ref 8.9–10.3)
Chloride: 110 mmol/L (ref 98–111)
Creatinine, Ser: 2 mg/dL — ABNORMAL HIGH (ref 0.44–1.00)
GFR calc Af Amer: 30 mL/min — ABNORMAL LOW (ref >60)
GFR calc non Af Amer: 26 mL/min — ABNORMAL LOW (ref >60)
Glucose, Bld: 130 mg/dL — ABNORMAL HIGH (ref 70–99)
Potassium: 4.6 mmol/L (ref 3.5–5.1)
Sodium: 141 mmol/L (ref 135–145)
Total Bilirubin: 0.5 mg/dL (ref 0.3–1.2)
Total Protein: 6.3 g/dL — ABNORMAL LOW (ref 6.5–8.1)

## 2017-09-09 LAB — LIPASE, BLOOD: Lipase: 41 U/L (ref 11–51)

## 2017-09-09 NOTE — ED Triage Notes (Signed)
Pt reports having recent GI virus, had diarrhea starting last Saturday. Went to pcp and had blood work done and sent here for dehydration. Denies n/v and states diarrhea has improved.

## 2017-09-09 NOTE — ED Provider Notes (Signed)
Patient placed in Quick Look pathway, seen and evaluated   Chief Complaint: diarrhea  HPI:   Pt reports her provider told her to come in for Iv fluids for dehydration   ROS: no fever, no abdominal pain  Physical Exam:   Gen: No distress  Neuro: Awake and Alert  Skin: Warm    Focused Exam: heart rate 82 resp rate 18   Initiation of care has begun. The patient has been counseled on the process, plan, and necessity for staying for the completion/evaluation, and the remainder of the medical screening examination   Sidney Ace 09/09/17 [place], Amasa, MD 09/11/17 [phone]

## 2017-09-09 NOTE — Telephone Encounter (Signed)
Copied from [place] [id number]. Topic: Quick Communication - See Telephone Encounter >> Sep 09, 2017  3:15 PM Neva Seat wrote: Pt calling back on lab results.  Please call pt back asap when they can be disclosed.

## 2017-09-09 NOTE — ED Notes (Signed)
Pt in room Alert and with family @ bedside VS documented Call light within reach

## 2017-09-09 NOTE — Telephone Encounter (Signed)
Result has not been viewed yet by PCP. Will contact patient once I have notations from Dr. Jenny Reichmann.

## 2017-09-10 LAB — BASIC METABOLIC PANEL
Anion gap: 9 (ref 5–15)
BUN: 28 mg/dL — ABNORMAL HIGH (ref 6–20)
CO2: 22 mmol/L (ref 22–32)
Calcium: 8.4 mg/dL — ABNORMAL LOW (ref 8.9–10.3)
Chloride: 111 mmol/L (ref 98–111)
Creatinine, Ser: 1.74 mg/dL — ABNORMAL HIGH (ref 0.44–1.00)
GFR calc Af Amer: 36 mL/min — ABNORMAL LOW (ref >60)
GFR calc non Af Amer: 31 mL/min — ABNORMAL LOW (ref >60)
Glucose, Bld: 109 mg/dL — ABNORMAL HIGH (ref 70–99)
Potassium: 4.8 mmol/L (ref 3.5–5.1)
Sodium: 142 mmol/L (ref 135–145)

## 2017-09-10 MED ORDER — SODIUM CHLORIDE 0.9 % IV BOLUS
1000.0000 mL | Freq: Once | INTRAVENOUS | Status: AC
  Administered 2017-09-10: 1000 mL via INTRAVENOUS

## 2017-09-10 MED ORDER — SODIUM CHLORIDE 0.9 % IV BOLUS
500.0000 mL | Freq: Once | INTRAVENOUS | Status: AC
  Administered 2017-09-10: 500 mL via INTRAVENOUS

## 2017-09-10 NOTE — Discharge Instructions (Addendum)
Your kidney function has improved with fluids in the ED.  I would recommend having this rechecked with your renal doctor this week.  Return the ED if you develop any worsening symptoms.

## 2017-09-10 NOTE — ED Notes (Signed)
Pt ready for discharge VS documented IV removed

## 2017-09-10 NOTE — ED Provider Notes (Signed)
[facility] EMERGENCY DEPARTMENT Provider Note   CSN: [id number] Arrival date & time: 09/09/17  1839     History   Chief Complaint Chief Complaint  Patient presents with  . Diarrhea  . Dehydration    HPI Heather Dawson is a 62 y.o. female.  HPI 62 year old Caucasian female past medical history significant for CKD, hypertension, hyperlipidemia presents to the emergency department today for evaluation of dehydration and needing IV fluids.  Patient states that for the past week she has had a viral enteritis.  Patient reports that epigastric pain, diarrhea started 1 week ago.  States that her symptoms are improving.  Patient states that her abdominal pain has almost completely resolved.  She reports improvement in her loose stools.  Denies any nausea or vomiting.  Denies any fevers or bloody stools.  She did see her primary care doctor 2 days ago where lab work was drawn.  She was going to give a stool sample but the doctor recommended that she did not need this.  No recent antibiotic use.  Doubt C. difficile.  Patient was given Flagyl which she has been taking.  She was called by her primary care doctor today to tell her that her creatinine was slightly elevated and that she need IV fluids in the hospital.  Patient states that she is able to keep down p.o. fluids.  She does have history of CKD.  She called her renal doctor today and has not heard back from them yet.  At this time patient denies any abdominal pain, nausea or emesis.  Pt denies any fever, chill, ha, vision changes, lightheadedness, dizziness, congestion, neck pain, cp, sob, cough, abd pain, n/v/d, urinary symptoms, , melena, hematochezia, lower extremity paresthesias.  Past Medical History:  Diagnosis Date  . Abdominal pain, epigastric 12/20/2006  . ALLERGIC RHINITIS 12/25/2006  . Allergy   . ANXIETY 10/16/2006   no per pt  . Arthritis    right shoulder  . BACK PAIN 06/15/2008  . Blood transfusion without  reported diagnosis   . BRADYCARDIA 05/04/2008  . Carpal tunnel syndrome 10/16/2006  . Cellulitis and abscess of other specified site 06/15/2008  . CHEST PAIN 09/08/2009  . Chronic low back pain   . CIRRHOSIS 10/16/2006  . CKD (chronic kidney disease)    2008- had Ecoli and caused renal failure, no problems since then  . CKD (chronic kidney disease) stage 3, GFR 30-59 ml/min (HCC) 10/16/2006   Qualifier: Diagnosis of  By: Jenny Reichmann MD, [place] 12/20/2006  . Complication of anesthesia    awake during 2 surgeries  . CONTUSION, LOWER LEG 09/15/2008  . DEPRESSION 10/16/2006   no per pt  . Esophageal reflux 12/25/2006  . HYPERLIPIDEMIA 12/25/2006  . Hyperlipidemia   . HYPERTENSION 08/15/2006  . Hypertension   . HYPOTHYROIDISM 08/15/2006  . Kidney failure, acute ([place]) 2009   as a result of severe E Coli infection  . Left-sided carotid artery disease ([place])   . MIGRAINE HEADACHE 10/16/2006  . Obesity 10/16/2006  . OVARIAN CYST 12/20/2006  . Psoriasis 01/03/2011  . SHINGLES, HX OF 12/20/2006  . Stroke [facility])    3 strokes 2008-no deficits, only on ASA  . TRANSIENT ISCHEMIC ATTACKS, HX OF 12/20/2006  . VERTIGO 09/15/2007  . VITAMIN B12 DEFICIENCY 01/09/2007    Patient Active Problem List   Diagnosis Date Noted  . Left shoulder pain 04/23/2017  . Dysuria 04/04/2017  . Bloating symptom 04/04/2017  .  Acute pain of left shoulder 02/06/2017  . Urinary urgency 11/24/2016  . Rash 10/05/2016  . Bunion of right foot 09/22/2016  . Trigger middle finger of left hand 09/22/2016  . Arthritis of hand, degenerative 09/22/2016  . Left foot pain 02/22/2016  . Pain and swelling of right lower leg 02/22/2016  . Neck pain 02/01/2016  . Postop carotid endarterectomy surveillance, encounter for 12/01/2015  . Cough 04/27/2015  . Left carotid artery stenosis 08/25/2014  . Left-sided carotid artery disease ([place]) 08/10/2014  . Stenosis of right carotid artery 07/27/2014  . Chronic low back pain  07/14/2014  . 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 Dizziness and giddiness 11/06/2013  . GERD (gastroesophageal reflux disease) 11/06/2013  . Influenza 02/21/2013  . Paronychia 12/30/2012  . Encounter for long-term (current) use of high-risk medication 03/02/2011  . Psoriasis 01/03/2011  . Encounter for well adult exam with abnormal findings 12/30/2010  . CHEST PAIN 09/08/2009  . LEG CRAMPS 06/20/2009  . HEMATOCHEZIA 03/21/2009  . GANGLION CYST, TENDON SHEATH 11/18/2008  . BACK PAIN 06/15/2008  . TRANSIENT ISCHEMIC ATTACK 10/14/2007  . ESOPHAGITIS 03/13/2007  . VITAMIN B12 DEFICIENCY 01/09/2007  . Diabetes ([place]) 12/25/2006  . Hyperlipidemia 12/25/2006  . Allergic rhinitis 12/25/2006  . Esophageal reflux 12/25/2006  . COMMON MIGRAINE 12/20/2006  . OVARIAN CYST 12/20/2006  . Other malaise and fatigue 12/20/2006  . SHINGLES, HX OF 12/20/2006  . CHOLERA, D/T VIBRIO CHOLERAE EL TOR 10/16/2006  . Morbid obesity ([place]) 10/16/2006  . ANXIETY 10/16/2006  . DEPRESSION 10/16/2006  . Carpal tunnel syndrome 10/16/2006  . CKD (chronic kidney disease) stage 3, GFR 30-59 ml/min (HCC) 10/16/2006  . Hypothyroidism 08/15/2006  . Essential hypertension 08/15/2006    Past Surgical History:  Procedure Laterality Date  . ABDOMINAL HYSTERECTOMY  02/2006  . ANTERIOR CERVICAL DECOMP/DISCECTOMY FUSION  2005  . CESAREAN SECTION    . CHOLECYSTECTOMY    . COLONOSCOPY    . ENDARTERECTOMY Left 08/25/2014   Procedure: LEFT CAROTID ENDARTERECTOMY WITH HEMASHIELD PATCH ANGIOPLASTY;  Surgeon: Elam Dutch, MD;  Location: [facility];  Service: Vascular;  Laterality: Left;  . NECK SURGERY  2005  . ROTATOR CUFF REPAIR Right 02/2014   Dr Veverly Fells     OB History   None      Home Medications    Prior to Admission medications   Medication Sig Start Date End Date Taking? Authorizing Provider  acetaminophen (TYLENOL) 500 MG tablet Take 1,000 mg by mouth every 8 (eight) hours as needed (pain).   Yes [provider]  amLODipine (NORVASC) 10 MG tablet Take 1 tablet (10 mg total) by mouth daily. Patient taking differently: Take 10 mg by mouth daily before breakfast.  09/02/17  Yes Biagio Borg, MD  aspirin 325 MG EC tablet Take 325 mg by mouth daily.     Yes [provider]  Calcium Carb-Cholecalciferol (CALCIUM 500 +D) 500-400 MG-UNIT TABS Take 2 tablets by mouth daily.   Yes [provider]  Cholecalciferol (VITAMIN D) 2000 units tablet Take 2,000 Units by mouth daily.   Yes [provider]  clobetasol (TEMOVATE) 0.05 % external solution Apply 1 application topically daily as needed (scalp psoriasis).  12/07/15  Yes [provider]  diclofenac sodium (VOLTAREN) 1 % GEL Apply 4 g topically 4 (four) times daily as needed. Patient taking differently: Apply 1 application topically daily as needed (shoulder pain).  04/23/17  Yes Biagio Borg, MD  levothyroxine (SYNTHROID, LEVOTHROID) 100 MCG tablet Take 1  tablet (100 mcg total) by mouth daily before breakfast. 09/02/17  Yes Biagio Borg, MD  lisinopril (PRINIVIL,ZESTRIL) 20 MG tablet Take 1 tablet (20 mg total) by mouth daily. 04/23/17  Yes Biagio Borg, MD  lovastatin (MEVACOR) 40 MG tablet Take 1 tablet (40 mg total) by mouth at bedtime. 04/23/17  Yes Biagio Borg, MD  metroNIDAZOLE (FLAGYL) 500 MG tablet Take 1 tablet (500 mg total) by mouth 3 (three) times daily. Patient taking differently: Take 500 mg by mouth 3 (three) times daily. 7 day course started 09/05/17 pm 09/05/17  Yes Marrian Salvage, FNP  omeprazole (PRILOSEC) 20 MG capsule Take 1 capsule (20 mg total) by mouth daily. Patient taking differently: Take 20 mg by mouth at bedtime.  09/02/17  Yes Biagio Borg, MD  SUMAtriptan (IMITREX) 100 MG tablet TAKE 1 TABLET BY MOUTH EVERY 2 HOURS AS NEEDED FOR MIGRAINE OR HEADACHE.  MAY REPEAT IN 2 HOURS IF HEADACHE PERSISTS OR RECURS. Patient taking differently: Take 100 mg by mouth See admin instructions. Take  1 tablet (100 mg) by mouth every 2 hours as needed for migraine or headache, may repeat in 2 hours if headache persists or recurs 04/23/17  Yes Biagio Borg, MD  triamcinolone cream (KENALOG) 0.1 % Apply 1 application topically 2 (two) times daily. Patient taking differently: Apply 1 application topically daily as needed (psoriasis in ears).  07/18/17 07/18/18 Yes Biagio Borg, MD  Turmeric 500 MG TABS Take 500 mg by mouth daily.   Yes [provider]  Cholecalciferol (VITAMIN D3) 1000 units CAPS Take 2 capsules (2,000 Units total) by mouth daily. Patient not taking: Reported on 09/09/2017 04/27/15   Biagio Borg, MD  fexofenadine (ALLEGRA) 180 MG tablet Take 1 tablet (180 mg total) by mouth every other day. Patient not taking: Reported on 09/09/2017 04/23/17   Biagio Borg, MD    Family History Family History  Problem Relation Age of Onset  . Stroke Father   . Heart disease Father   . Diabetes Father   . Lupus Mother   . Raynaud syndrome Mother   . Heart disease Mother   . Diabetes Sister   . Heart disease Brother   . Hypertension Brother   . Heart disease Brother   . Hypertension Brother   . Diabetes Other   . Hypertension Other   . Colon cancer Neg Hx   . Esophageal cancer Neg Hx   . Rectal cancer Neg Hx   . Stomach cancer Neg Hx     Social History Social History   Tobacco Use  . Smoking status: Former Research scientist (life sciences)  . Smokeless tobacco: Never Used  . Tobacco comment: quit smoking over 30 years ago  Substance Use Topics  . Alcohol use: No  . Drug use: No     Allergies   Cefuroxime axetil; Ciprofloxacin; Levaquin [levofloxacin in d5w]; Lipitor [atorvastatin]; Lyrica [pregabalin]; Macrodantin [nitrofurantoin macrocrystal]; and Gabapentin   Review of Systems Review of Systems  All other systems reviewed and are negative.    Physical Exam Updated Vital Signs BP 129/65   Pulse (!) 58   Temp 98.7 F (37.1 C) (Oral)   Resp 16   Ht 5\' 2"  (1.575 m)   Wt 88.5 kg  (195 lb)   SpO2 99%   BMI 35.67 kg/m   Physical Exam  Constitutional: She is oriented to person, place, and time. She appears well-developed and well-nourished.  Non-toxic appearance. No distress.  HENT:  Head:  Normocephalic and atraumatic.  Nose: Nose normal.  Mouth/Throat: Oropharynx is clear and moist.  Eyes: Pupils are equal, round, and reactive to light. Conjunctivae are normal. Right eye exhibits no discharge. Left eye exhibits no discharge.  Neck: Normal range of motion. Neck supple.  Cardiovascular: Normal rate, regular rhythm, normal heart sounds and intact distal pulses. Exam reveals no gallop and no friction rub.  No murmur heard. Pulmonary/Chest: Effort normal.  Abdominal: Soft. Bowel sounds are normal. There is no tenderness. There is no rigidity, no rebound, no guarding and no CVA tenderness.  Musculoskeletal: Normal range of motion. She exhibits no tenderness.  Lymphadenopathy:    She has no cervical adenopathy.  Neurological: She is alert and oriented to person, place, and time.  Skin: Skin is warm and dry. Capillary refill takes less than 2 seconds.  Psychiatric: Her behavior is normal. Judgment and thought content normal.  Nursing note and vitals reviewed.    ED Treatments / Results  Labs (all labs ordered are listed, but only abnormal results are displayed) Labs Reviewed  COMPREHENSIVE METABOLIC PANEL - Abnormal; Notable for the following components:      Result Value   Glucose, Bld 130 (*)    BUN 28 (*)    Creatinine, Ser 2.00 (*)    Total Protein 6.3 (*)    GFR calc non Af Amer 26 (*)    GFR calc Af Amer 30 (*)    All other components within normal limits  URINALYSIS, ROUTINE W REFLEX MICROSCOPIC - Abnormal; Notable for the following components:   Leukocytes, UA TRACE (*)    Bacteria, UA RARE (*)    All other components within normal limits  BASIC METABOLIC PANEL - Abnormal; Notable for the following components:   Glucose, Bld 109 (*)    BUN 28 (*)      Creatinine, Ser 1.74 (*)    Calcium 8.4 (*)    GFR calc non Af Amer 31 (*)    GFR calc Af Amer 36 (*)    All other components within normal limits  LIPASE, BLOOD  CBC    EKG None  Radiology No results found.  Procedures Procedures (including critical care time)  Medications Ordered in ED Medications  sodium chloride 0.9 % bolus 1,000 mL (0 mLs Intravenous Stopped 09/10/17 0117)  sodium chloride 0.9 % bolus 500 mL (0 mLs Intravenous Stopped 09/10/17 0117)     Initial Impression / Assessment and Plan / ED Course  I have reviewed the triage vital signs and the nursing notes.  Pertinent labs & imaging results that were available during my care of the patient were reviewed by me and considered in my medical decision making (see chart for details).     Patient presents to the ED for evaluation of dehydration in the setting of viral gastroenteritis.  Patient states that her symptoms are improving.  She was told by her primary care doctor that she needs IV fluids.  Patient is currently taking Flagyl.  Vital signs reassuring.  Patient is not febrile, no hypotension or tachycardia noted.  Patient has no focal abdominal tenderness on palpation.  No significant signs of dehydration on exam.  Lab work has been overall reassuring.  No leukocytosis.  Normal lipase.  UA shows no signs of infection.  Patient's creatinine was mildly elevated at 2.0.  Baseline appears to be 1.5-1.6.  Patient was given 1.5 L of IV fluid in the ED.  On repeat blood draw her creatinine improved to 1.7.  This appears to be close to patient's baseline.  She does have a renal appointment this week.  Patient tolerating p.o. fluids.  No benefit to admission at this time given that patient is tolerating p.o. fluids.  Patient will need close outpatient follow-up.  Encourage patient continue taking her Flagyl.  Discussed reasons to return the ED.  Pt is hemodynamically stable, in NAD, & able to ambulate in the ED. Evaluation  does not show pathology that would require ongoing emergent intervention or inpatient treatment. I explained the diagnosis to the patient. Pain has been managed & has no complaints prior to dc. Pt is comfortable with above plan and is stable for discharge at this time. All questions were answered prior to disposition. Strict return precautions for f/u to the ED were discussed. Encouraged follow up with PCP.    Final Clinical Impressions(s) / ED Diagnoses   Final diagnoses:  Dehydration    ED Discharge Orders    None       Aaron Edelman 09/10/17 0326    Veryl Speak, MD 09/10/17 [phone]

## 2017-09-12 ENCOUNTER — Ambulatory Visit: Payer: Self-pay

## 2017-09-12 NOTE — Telephone Encounter (Signed)
  Incoming call from patient who returned call from triage nurse. Patient states that she continues to have a combination of loose stools and semi formed stools.   Had 2 loose stools yesterday and has already had 2 today.  Patient states she " just doesn't feel well."  Had a scrambled egg and toast for breakfast. Just a plain bowl of white rice for dinner last night. Four bottles of water yesterday,  One bottle of Gatorade. One bottle Gatorade today.  States she has been on an antibiotic since last Thursday. Today will be the last dose.  Rates pain as 3.  Reviewed care advice with patient.  Provided appointment with Dr.  [name],  Friday, July 26,2019 with Dr. [name].       Reason for Disposition . [1] MILD diarrhea (e.g., 1-3 or more stools than normal in past 24 hours) without known cause AND [2] present >  7 days  Answer Assessment - Initial Assessment Questions 1. DIARRHEA SEVERITY: "How bad is the diarrhea?" "How many extra stools have you had in the past 24 hours than normal?"    - NO DIARRHEA (SCALE 0)   - MILD (SCALE 1-3): Few loose or mushy BMs; increase of 1-3 stools over normal daily number of stools; mild increase in ostomy output.   -  MODERATE (SCALE 4-7): Increase of 4-6 stools daily over normal; moderate increase in ostomy output. * SEVERE (SCALE 8-10; OR 'WORST POSSIBLE'): Increase of 7 or more stools daily over normal; moderate increase in ostomy output; incontinence.     2. ONSET: "When did the diarrhea begin?" yesterday     yesterday 3. BM CONSISTENCY: "How loose or watery is the diarrhea?"      Mixture and formed 4. VOMITING: "Are you also vomiting?" If so, ask: "How many times in the past 24 hours?"      no 5. ABDOMINAL PAIN: "Are you having any abdominal pain?" If yes: "What does it feel like?" (e.g., crampy, dull, intermittent, constant)      No discomfort 6. ABDOMINAL PAIN SEVERITY: If present, ask: "How bad is the pain?"  (e.g., Scale 1-10; mild, moderate, or  severe)   - MILD (1-3): doesn't interfere with normal activities, abdomen soft and not tender to touch    - MODERATE (4-7): interferes with normal activities or awakens from sleep, tender to touch    - SEVERE (8-10): excruciating pain, doubled over, unable to do any normal activities       *No Answer* 7. ORAL INTAKE: If vomiting, "Have you been able to drink liquids?" "How much fluids have you had in the past 24 hours?"     *No Answer* 8. HYDRATION: "Any signs of dehydration?" (e.g., dry mouth [not just dry lips], too weak to stand, dizziness, new weight loss) "When did you last urinate?" light headed"     *No Answer* 9. EXPOSURE: "Have you traveled to a foreign country recently?" "Have you been exposed to anyone with diarrhea?" "Could you have eaten any food that was spoiled?"no"     *No Answer* 10. ANTIBIOTIC USE: "Are you taking antibiotics now or have you taken antibiotics in the past 2 months?"      now 11. OTHER SYMPTOMS: "Do you have any other symptoms?" (e.g., fever, blood in stool)      no 12. PREGNANCY: "Is there any chance you are pregnant?" "When was your last menstrual period?"     na  Protocols used: [facility]

## 2017-09-12 NOTE — Telephone Encounter (Signed)
Noted  

## 2017-09-13 ENCOUNTER — Other Ambulatory Visit (INDEPENDENT_AMBULATORY_CARE_PROVIDER_SITE_OTHER): Payer: Medicare Other

## 2017-09-13 ENCOUNTER — Ambulatory Visit: Payer: Medicare Other | Admitting: Family

## 2017-09-13 ENCOUNTER — Encounter: Payer: Self-pay | Admitting: Family

## 2017-09-13 ENCOUNTER — Ambulatory Visit: Payer: Medicare Other | Admitting: Internal Medicine

## 2017-09-13 VITALS — BP 136/80 | HR 81 | Temp 98.2°F | Ht 62.0 in | Wt 194.1 lb

## 2017-09-13 DIAGNOSIS — R7989 Other specified abnormal findings of blood chemistry: Secondary | ICD-10-CM | POA: Diagnosis not present

## 2017-09-13 DIAGNOSIS — R197 Diarrhea, unspecified: Secondary | ICD-10-CM

## 2017-09-13 LAB — COMPREHENSIVE METABOLIC PANEL
ALT: 25 U/L (ref 0–35)
AST: 16 U/L (ref 0–37)
Albumin: 4.3 g/dL (ref 3.5–5.2)
Alkaline Phosphatase: 66 U/L (ref 39–117)
BUN: 26 mg/dL — ABNORMAL HIGH (ref 6–23)
CO2: 28 mEq/L (ref 19–32)
Calcium: 9.4 mg/dL (ref 8.4–10.5)
Chloride: 105 mEq/L (ref 96–112)
Creatinine, Ser: 1.68 mg/dL — ABNORMAL HIGH (ref 0.40–1.20)
GFR: 32.94 mL/min — ABNORMAL LOW (ref >60.00)
Glucose, Bld: 147 mg/dL — ABNORMAL HIGH (ref 70–99)
Potassium: 4.4 mEq/L (ref 3.5–5.1)
Sodium: 140 mEq/L (ref 135–145)
Total Bilirubin: 0.4 mg/dL (ref 0.2–1.2)
Total Protein: 7 g/dL (ref 6.0–8.3)

## 2017-09-13 LAB — CBC WITH DIFFERENTIAL/PLATELET
Basophils Absolute: 0 10*3/uL (ref 0.0–0.1)
Basophils Relative: 0.3 % (ref 0.0–3.0)
Eosinophils Absolute: 0.2 10*3/uL (ref 0.0–0.7)
Eosinophils Relative: 3 % (ref 0.0–5.0)
HCT: 38.5 % (ref 36.0–46.0)
Hemoglobin: 12.9 g/dL (ref 12.0–15.0)
Lymphocytes Relative: 20.6 % (ref 12.0–46.0)
Lymphs Abs: 1.7 10*3/uL (ref 0.7–4.0)
MCHC: 33.6 g/dL (ref 30.0–36.0)
MCV: 94.9 fl (ref 78.0–100.0)
Monocytes Absolute: 0.5 10*3/uL (ref 0.1–1.0)
Monocytes Relative: 6 % (ref 3.0–12.0)
Neutro Abs: 5.8 10*3/uL (ref 1.4–7.7)
Neutrophils Relative %: 70.1 % (ref 43.0–77.0)
Platelets: 214 10*3/uL (ref 150.0–400.0)
RBC: 4.06 Mil/uL (ref 3.87–5.11)
RDW: 13.4 % (ref 11.5–15.5)
WBC: 8.3 10*3/uL (ref 4.0–10.5)

## 2017-09-13 NOTE — Progress Notes (Signed)
Heather Dawson is a 62 y.o. female with the following history as recorded in EpicCare:  Patient Active Problem List   Diagnosis Date Noted  . Left shoulder pain 04/23/2017  . Dysuria 04/04/2017  . Bloating symptom 04/04/2017  . Acute pain of left shoulder 02/06/2017  . Urinary urgency 11/24/2016  . Rash 10/05/2016  . Bunion of right foot 09/22/2016  . Trigger middle finger of left hand 09/22/2016  . Arthritis of hand, degenerative 09/22/2016  . Left foot pain 02/22/2016  . Pain and swelling of right lower leg 02/22/2016  . Neck pain 02/01/2016  . Postop carotid endarterectomy surveillance, encounter for 12/01/2015  . Cough 04/27/2015  . Left carotid artery stenosis 08/25/2014  . Left-sided carotid artery disease ([place]) 08/10/2014  . Stenosis of right carotid artery 07/27/2014  . Chronic low back pain 07/14/2014  . 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 Dizziness and giddiness 11/06/2013  . GERD (gastroesophageal reflux disease) 11/06/2013  . Influenza 02/21/2013  . Paronychia 12/30/2012  . Encounter for long-term (current) use of high-risk medication 03/02/2011  . Psoriasis 01/03/2011  . Encounter for well adult exam with abnormal findings 12/30/2010  . CHEST PAIN 09/08/2009  . LEG CRAMPS 06/20/2009  . HEMATOCHEZIA 03/21/2009  . GANGLION CYST, TENDON SHEATH 11/18/2008  . BACK PAIN 06/15/2008  . TRANSIENT ISCHEMIC ATTACK 10/14/2007  . ESOPHAGITIS 03/13/2007  . VITAMIN B12 DEFICIENCY 01/09/2007  . Diabetes ([place]) 12/25/2006  . Hyperlipidemia 12/25/2006  . Allergic rhinitis 12/25/2006  . Esophageal reflux 12/25/2006  . COMMON MIGRAINE 12/20/2006  . OVARIAN CYST 12/20/2006  . Other malaise and fatigue 12/20/2006  . SHINGLES, HX OF 12/20/2006  . CHOLERA, D/T VIBRIO CHOLERAE EL TOR 10/16/2006  . Morbid obesity ([place]) 10/16/2006  . ANXIETY 10/16/2006  . DEPRESSION 10/16/2006  . Carpal tunnel syndrome 10/16/2006  . CKD (chronic kidney disease) stage 3, GFR 30-59 ml/min (HCC)  10/16/2006  . Hypothyroidism 08/15/2006  . Essential hypertension 08/15/2006    Current Outpatient Medications  Medication Sig Dispense Refill  . acetaminophen (TYLENOL) 500 MG tablet Take 1,000 mg by mouth every 8 (eight) hours as needed (pain).    Marland Kitchen amLODipine (NORVASC) 10 MG tablet Take 1 tablet (10 mg total) by mouth daily. (Patient taking differently: Take 10 mg by mouth daily before breakfast. ) 90 tablet 1  . aspirin 325 MG EC tablet Take 325 mg by mouth daily.      . calcium carbonate (OSCAL) 1500 (600 Ca) MG TABS tablet Take by mouth 2 (two) times daily with a meal.    . Cholecalciferol (VITAMIN D3) 1000 units CAPS Take 2 capsules (2,000 Units total) by mouth daily. 60 capsule 0  . clobetasol (TEMOVATE) 0.05 % external solution Apply 1 application topically daily as needed (scalp psoriasis).   0  . diclofenac sodium (VOLTAREN) 1 % GEL Apply 4 g topically 4 (four) times daily as needed. (Patient taking differently: Apply 1 application topically daily as needed (shoulder pain). ) 400 g 11  . levocetirizine (XYZAL) 5 MG tablet     . levothyroxine (SYNTHROID, LEVOTHROID) 100 MCG tablet Take 1 tablet (100 mcg total) by mouth daily before breakfast. 90 tablet 1  . lisinopril (PRINIVIL,ZESTRIL) 20 MG tablet Take 1 tablet (20 mg total) by mouth daily. 90 tablet 3  . lovastatin (MEVACOR) 40 MG tablet Take 1 tablet (40 mg total) by mouth at bedtime. 90 tablet 3  . omeprazole (PRILOSEC) 20 MG capsule Take 1 capsule (20 mg total) by mouth daily. (Patient taking differently:  Take 20 mg by mouth at bedtime. ) 90 capsule 1  . SUMAtriptan (IMITREX) 100 MG tablet TAKE 1 TABLET BY MOUTH EVERY 2 HOURS AS NEEDED FOR MIGRAINE OR HEADACHE.  MAY REPEAT IN 2 HOURS IF HEADACHE PERSISTS OR RECURS. (Patient taking differently: Take 100 mg by mouth See admin instructions. Take 1 tablet (100 mg) by mouth every 2 hours as needed for migraine or headache, may repeat in 2 hours if headache persists or recurs) 10 tablet 5   . triamcinolone cream (KENALOG) 0.1 % Apply 1 application topically 2 (two) times daily. (Patient taking differently: Apply 1 application topically daily as needed (psoriasis in ears). ) 30 g 0  . Turmeric 500 MG TABS Take 500 mg by mouth daily.     No current facility-administered medications for this visit.     Allergies: Cefuroxime axetil; Ciprofloxacin; Levaquin [levofloxacin in d5w]; Lipitor [atorvastatin]; Lyrica [pregabalin]; Macrodantin [nitrofurantoin macrocrystal]; and Gabapentin  Past Medical History:  Diagnosis Date  . Abdominal pain, epigastric 12/20/2006  . ALLERGIC RHINITIS 12/25/2006  . Allergy   . ANXIETY 10/16/2006   no per pt  . Arthritis    right shoulder  . BACK PAIN 06/15/2008  . Blood transfusion without reported diagnosis   . BRADYCARDIA 05/04/2008  . Carpal tunnel syndrome 10/16/2006  . Cellulitis and abscess of other specified site 06/15/2008  . CHEST PAIN 09/08/2009  . Chronic low back pain   . CIRRHOSIS 10/16/2006  . CKD (chronic kidney disease)    2008- had Ecoli and caused renal failure, no problems since then  . CKD (chronic kidney disease) stage 3, GFR 30-59 ml/min (HCC) 10/16/2006   Qualifier: Diagnosis of  By: Jenny Reichmann MD, [place] 12/20/2006  . Complication of anesthesia    awake during 2 surgeries  . CONTUSION, LOWER LEG 09/15/2008  . DEPRESSION 10/16/2006   no per pt  . Esophageal reflux 12/25/2006  . HYPERLIPIDEMIA 12/25/2006  . Hyperlipidemia   . HYPERTENSION 08/15/2006  . Hypertension   . HYPOTHYROIDISM 08/15/2006  . Kidney failure, acute ([place]) 2009   as a result of severe E Coli infection  . Left-sided carotid artery disease ([place])   . MIGRAINE HEADACHE 10/16/2006  . Obesity 10/16/2006  . OVARIAN CYST 12/20/2006  . Psoriasis 01/03/2011  . SHINGLES, HX OF 12/20/2006  . Stroke [facility])    3 strokes 2008-no deficits, only on ASA  . TRANSIENT ISCHEMIC ATTACKS, HX OF 12/20/2006  . VERTIGO 09/15/2007  . VITAMIN B12 DEFICIENCY 01/09/2007     Past Surgical History:  Procedure Laterality Date  . ABDOMINAL HYSTERECTOMY  02/2006  . ANTERIOR CERVICAL DECOMP/DISCECTOMY FUSION  2005  . CESAREAN SECTION    . CHOLECYSTECTOMY    . COLONOSCOPY    . ENDARTERECTOMY Left 08/25/2014   Procedure: LEFT CAROTID ENDARTERECTOMY WITH HEMASHIELD PATCH ANGIOPLASTY;  Surgeon: Elam [place], MD;  Location: [facility];  Service: Vascular;  Laterality: Left;  . NECK SURGERY  2005  . ROTATOR CUFF REPAIR Right 02/2014   Dr Veverly Fells    Family History  Problem Relation Age of Onset  . Stroke Father   . Heart disease Father   . Diabetes Father   . Lupus Mother   . Raynaud syndrome Mother   . Heart disease Mother   . Diabetes Sister   . Heart disease Brother   . Hypertension Brother   . Heart disease Brother   . Hypertension Brother   . Diabetes Other   . Hypertension Other   .  Colon cancer Neg Hx   . Esophageal cancer Neg Hx   . Rectal cancer Neg Hx   . Stomach cancer Neg Hx     Social History   Tobacco Use  . Smoking status: Former Research scientist (life sciences)  . Smokeless tobacco: Never Used  . Tobacco comment: quit smoking over 30 years ago  Substance Use Topics  . Alcohol use: No    Subjective:  1 week follow-up on acute diarrhea; was treated with 7 day course of Flagyl- completed yesterday; allergic to Cipro; patient was sent to the ER on Monday with concerns for AKI from the diarrhea- received 1.5 L of fluid with initial relief; however, symptoms re-flared within about 24 hours; notes that she is just feeling weak/ tired- "ready to eat solid food." Denies any fever, abdominal pain or blood in the stool; stool culture were sent home last week but was told to hold as she showed initial improvement with the Flagyl; has not lost significant weight this week;    Objective:  Vitals:   09/13/17 1104  BP: 136/80  Pulse: 81  Temp: 98.2 F (36.8 C)  TempSrc: Oral  SpO2: 99%  Weight: 194 lb 1.3 oz (88 kg)  Height: 5' 2"  (1.575 m)    General: Well developed,  well nourished, in no acute distress  Skin : Warm and dry.  Head: Normocephalic and atraumatic  Lungs: Respirations unlabored; clear to auscultation bilaterally without wheeze, rales, rhonchi  CVS exam: normal rate and regular rhythm.  Abdomen: Soft; nontender; nondistended; normoactive bowel sounds; no masses or hepatosplenomegaly  Neurologic: Alert and oriented; speech intact; face symmetrical; moves all extremities well; CNII-XII intact without focal deficit   Assessment:  1. Elevated serum creatinine   2. Diarrhea, unspecified type     Plan:  1. STAT CBC, CMP done in office- good improvement in creatinine; level is back to patient's baseline; does have CKD and under care of nephrology.  2. CBC is normal; X-ray done last week did not show obstruction; will have patient try OTC Immodium for symptom relief; will most likely need to do stool studies- will see how she responds to Immodium; may need to refer to GI.   No follow-ups on file.  Orders Placed This Encounter  Procedures  . Comp Met (CMET)    Standing Status:   Future    Number of Occurrences:   1    Standing Expiration Date:   09/13/2018  . CBC w/Diff    Standing Status:   Future    Number of Occurrences:   1    Standing Expiration Date:   09/13/2018    Requested Prescriptions    No prescriptions requested or ordered in this encounter

## 2017-09-16 ENCOUNTER — Encounter: Payer: Self-pay | Admitting: Family

## 2017-09-25 ENCOUNTER — Telehealth: Payer: Self-pay | Admitting: Internal Medicine

## 2017-09-25 NOTE — Telephone Encounter (Unsigned)
Copied from [place] [id number]. Topic: Quick Communication - See Telephone Encounter >> Sep 25, 2017  2:56 PM Hewitt Shorts wrote: Pt is needing a refill on lovastatin   [facility]   Best number [phone]

## 2017-09-25 NOTE — Telephone Encounter (Signed)
Attempted to call patient to clarify how many tablets she has left with her lovastatin 40 mg tabs. No answer, left message for patient to call back. And also if she contacted the new pharmacy to have her prescriptions transferred there.

## 2017-09-27 ENCOUNTER — Encounter: Payer: Self-pay | Admitting: Family

## 2017-10-01 DIAGNOSIS — N183 Chronic kidney disease, stage 3 (moderate): Secondary | ICD-10-CM | POA: Diagnosis not present

## 2017-10-02 ENCOUNTER — Telehealth: Payer: Self-pay | Admitting: Internal Medicine

## 2017-10-02 MED ORDER — LOVASTATIN 40 MG PO TABS: 40.0000 mg | ORAL_TABLET | Freq: Every day | ORAL | 0 refills | Status: DC

## 2017-10-02 MED ORDER — LOVASTATIN 40 MG PO TABS: 40.0000 mg | ORAL_TABLET | Freq: Every day | ORAL | 3 refills | Status: DC

## 2017-10-02 NOTE — Telephone Encounter (Signed)
Copied from [id number]. Topic: Quick Communication - Rx Refill/Question >> Oct 02, 2017  8:52 AM Cecelia Byars, NT wrote: Medication: lovastatin (MEVACOR) 40 MG tablet  Has the patient contacted their pharmacy? yes  (Agent: If no, request that the patient contact the pharmacy for the refill. (Agent: If yes, when and what did the pharmacy advise?  Preferred Pharmacy (with phone number or street name [facility] - [place], [place] - [place] [phone] (Phone) [phone] (Fax) Patient would like 7 pills sent to above pharmacy until she gets her 53 day supply from [facility]    Agent: Please be advised that RX refills may take up to 3 business days. We ask that you follow-up with your pharmacy.

## 2017-10-02 NOTE — Telephone Encounter (Signed)
Copied from [id number]. Topic: Quick Communication - Rx Refill/Question >> Oct 02, 2017  8:58 AM Cecelia Byars, NT wrote: Medication:  lisinopril (PRINIVIL,ZESTRIL) 20 MG tablet   Has the patient contacted their pharmacy? yes  (Agent: If no, request that the patient contact the pharmacy for the refill. (Agent: If yes, when and what did the pharmacy advise  Preferred Pharmacy (with phone number or street name [facility] [place], [place] [phone] (Phone) [phone] (Fax  Patient would like a 90 day supply sent to the above pharmacy   Agent: Please be advised that RX refills may take up to 3 business days. We ask that you follow-up with your pharmacy.

## 2017-10-17 DIAGNOSIS — M8588 Other specified disorders of bone density and structure, other site: Secondary | ICD-10-CM | POA: Diagnosis not present

## 2017-10-17 DIAGNOSIS — Z01419 Encounter for gynecological examination (general) (routine) without abnormal findings: Secondary | ICD-10-CM | POA: Diagnosis not present

## 2017-10-17 DIAGNOSIS — Z6836 Body mass index (BMI) 36.0-36.9, adult: Secondary | ICD-10-CM | POA: Diagnosis not present

## 2017-10-17 DIAGNOSIS — Z1231 Encounter for screening mammogram for malignant neoplasm of breast: Secondary | ICD-10-CM | POA: Diagnosis not present

## 2017-10-24 ENCOUNTER — Ambulatory Visit: Payer: Medicare Other | Admitting: Internal Medicine

## 2017-10-24 ENCOUNTER — Ambulatory Visit (INDEPENDENT_AMBULATORY_CARE_PROVIDER_SITE_OTHER)
Admission: RE | Admit: 2017-10-24 | Discharge: 2017-10-24 | Disposition: A | Discharge status: Home / Self Care | Payer: Medicare Other | Source: Ambulatory Visit | Attending: Internal Medicine | Admitting: Internal Medicine

## 2017-10-24 ENCOUNTER — Encounter: Payer: Self-pay | Admitting: Internal Medicine

## 2017-10-24 VITALS — BP 138/86 | HR 71 | Temp 98.4°F | Ht 62.0 in | Wt 202.0 lb

## 2017-10-24 DIAGNOSIS — G8929 Other chronic pain: Secondary | ICD-10-CM

## 2017-10-24 DIAGNOSIS — M546 Pain in thoracic spine: Secondary | ICD-10-CM

## 2017-10-24 DIAGNOSIS — Z23 Encounter for immunization: Secondary | ICD-10-CM | POA: Diagnosis not present

## 2017-10-24 DIAGNOSIS — N183 Chronic kidney disease, stage 3 unspecified: Secondary | ICD-10-CM

## 2017-10-24 DIAGNOSIS — E119 Type 2 diabetes mellitus without complications: Secondary | ICD-10-CM | POA: Diagnosis not present

## 2017-10-24 DIAGNOSIS — I10 Essential (primary) hypertension: Secondary | ICD-10-CM | POA: Diagnosis not present

## 2017-10-24 DIAGNOSIS — M5134 Other intervertebral disc degeneration, thoracic region: Secondary | ICD-10-CM | POA: Diagnosis not present

## 2017-10-24 LAB — POCT GLYCOSYLATED HEMOGLOBIN (HGB A1C)
HbA1c POC (<> result, manual entry): 0 % (ref 4.0–5.6)
HbA1c, POC (controlled diabetic range): 0 % (ref 0.0–7.0)
HbA1c, POC (prediabetic range): 0 % — AB (ref 5.7–6.4)
Hemoglobin A1C: 6.7 % — AB (ref 4.0–5.6)

## 2017-10-24 NOTE — Patient Instructions (Signed)
Your A1c was OK today  Please continue all other medications as before, and refills have been done if requested.  Please have the pharmacy call with any other refills you may need.  Please continue your efforts at being more active, low cholesterol diet, and weight control..  Please keep your appointments with your specialists as you may have planned  Please go to the XRAY Department in the Basement (go straight as you get off the elevator) for the x-ray testing  You will be contacted regarding the referral for: MRI and abdomen ultrasound  You will be contacted by phone if any changes need to be made immediately.  Otherwise, you will receive a letter about your results with an explanation, but please check with MyChart first.  Please remember to sign up for MyChart if you have not done so, as this will be important to you in the future with finding out test results, communicating by private email, and scheduling acute appointments online when needed.  Please return in 6 months, or sooner if needed

## 2017-10-24 NOTE — Assessment & Plan Note (Signed)
stable overall by history and exam, recent data reviewed with pt, and pt to continue medical treatment as before,  to f/u any worsening symptoms or concerns, for a1c with labs today 

## 2017-10-24 NOTE — Progress Notes (Signed)
Subjective:    Patient ID: Heather Dawson, female    DOB: November 09, 1955, 62 y.o.   MRN: [id number]  HPI Here to f/u; overall doing ok,  Pt denies chest pain, increasing sob or doe, wheezing, orthopnea, PND, increased LE swelling, palpitations, dizziness or syncope.  Pt denies new neurological symptoms such as new headache, or facial or extremity weakness or numbness.  Pt denies polydipsia, polyuria, or low sugar episode.  Pt states overall good compliance with meds, mostly trying to follow appropriate diet, with wt overall stable,  but little exercise however., due to worsening back pain. Cant even do pool excercises in the past month.  Has renal f/u appt in oct 2019.  Has unfort gained wt with less active due to back pain worse, now with recurring left sciatica like pain,  But worst of the pain is actually what sounds like mid thoracic pain much worse for some reason for 2 months, worse to bend and straighten back up, such as picking up limbs in the yard.  No fever, trauma, falls or worsening LE symptoms with the mid thoracic pain.  , sharp, constant, mod to severe  No prior hx of thoracic issues.  Last MRI lumbar with disc disease and annular tears Wt Readings from Last 3 Encounters:  10/24/17 202 lb (91.6 kg)  09/13/17 194 lb 1.3 oz (88 kg)  09/10/17 195 lb (88.5 kg)   Past Medical History:  Diagnosis Date  . Abdominal pain, epigastric 12/20/2006  . ALLERGIC RHINITIS 12/25/2006  . Allergy   . ANXIETY 10/16/2006   no per pt  . Arthritis    right shoulder  . BACK PAIN 06/15/2008  . Blood transfusion without reported diagnosis   . BRADYCARDIA 05/04/2008  . Carpal tunnel syndrome 10/16/2006  . Cellulitis and abscess of other specified site 06/15/2008  . CHEST PAIN 09/08/2009  . Chronic low back pain   . CIRRHOSIS 10/16/2006  . CKD (chronic kidney disease)    2008- had Ecoli and caused renal failure, no problems since then  . CKD (chronic kidney disease) stage 3, GFR 30-59 ml/min (HCC) 10/16/2006   Qualifier: Diagnosis of  By: Jenny Reichmann MD, [place] 12/20/2006  . Complication of anesthesia    awake during 2 surgeries  . CONTUSION, LOWER LEG 09/15/2008  . DEPRESSION 10/16/2006   no per pt  . Esophageal reflux 12/25/2006  . HYPERLIPIDEMIA 12/25/2006  . Hyperlipidemia   . HYPERTENSION 08/15/2006  . Hypertension   . HYPOTHYROIDISM 08/15/2006  . Kidney failure, acute ([place]) 2009   as a result of severe E Coli infection  . Left-sided carotid artery disease ([place])   . MIGRAINE HEADACHE 10/16/2006  . Obesity 10/16/2006  . OVARIAN CYST 12/20/2006  . Psoriasis 01/03/2011  . SHINGLES, HX OF 12/20/2006  . Stroke [facility])    3 strokes 2008-no deficits, only on ASA  . TRANSIENT ISCHEMIC ATTACKS, HX OF 12/20/2006  . VERTIGO 09/15/2007  . VITAMIN B12 DEFICIENCY 01/09/2007   Past Surgical History:  Procedure Laterality Date  . ABDOMINAL HYSTERECTOMY  02/2006  . ANTERIOR CERVICAL DECOMP/DISCECTOMY FUSION  2005  . CESAREAN SECTION    . CHOLECYSTECTOMY    . COLONOSCOPY    . ENDARTERECTOMY Left 08/25/2014   Procedure: LEFT CAROTID ENDARTERECTOMY WITH HEMASHIELD PATCH ANGIOPLASTY;  Surgeon: Elam Dutch, MD;  Location: [facility];  Service: Vascular;  Laterality: Left;  . NECK SURGERY  2005  . ROTATOR CUFF REPAIR Right 02/2014   Dr Veverly Fells  reports that she has quit smoking. She has never used smokeless tobacco. She reports that she does not drink alcohol or use drugs. family history includes Diabetes in her father, other, and sister; Heart disease in her brother, brother, father, and mother; Hypertension in her brother, brother, and other; Lupus in her mother; Raynaud syndrome in her mother; Stroke in her father. Allergies  Allergen Reactions  . Cefuroxime Axetil Swelling     edema/swelling  . Ciprofloxacin Swelling  . Levaquin [Levofloxacin In D5w] Other (See Comments)    GI upset  . Lipitor [Atorvastatin] Other (See Comments)    Myalgia   . Lyrica [Pregabalin] Other (See Comments)      HA, confusion, swelling  . Macrodantin [Nitrofurantoin Macrocrystal] Other (See Comments)    GI upset, diarrhea and sob  . Gabapentin Rash   Current Outpatient Medications on File Prior to Visit  Medication Sig Dispense Refill  . acetaminophen (TYLENOL) 500 MG tablet Take 1,000 mg by mouth every 8 (eight) hours as needed (pain).    Marland Kitchen amLODipine (NORVASC) 10 MG tablet Take 1 tablet (10 mg total) by mouth daily. (Patient taking differently: Take 10 mg by mouth daily before breakfast. ) 90 tablet 1  . aspirin 325 MG EC tablet Take 325 mg by mouth daily.      . calcium carbonate (OSCAL) 1500 (600 Ca) MG TABS tablet Take by mouth 2 (two) times daily with a meal.    . Cholecalciferol (VITAMIN D3) 1000 units CAPS Take 2 capsules (2,000 Units total) by mouth daily. 60 capsule 0  . clobetasol (TEMOVATE) 0.05 % external solution Apply 1 application topically daily as needed (scalp psoriasis).   0  . diclofenac sodium (VOLTAREN) 1 % GEL Apply 4 g topically 4 (four) times daily as needed. (Patient taking differently: Apply 1 application topically daily as needed (shoulder pain). ) 400 g 11  . levocetirizine (XYZAL) 5 MG tablet     . levothyroxine (SYNTHROID, LEVOTHROID) 100 MCG tablet Take 1 tablet (100 mcg total) by mouth daily before breakfast. 90 tablet 1  . lisinopril (PRINIVIL,ZESTRIL) 20 MG tablet Take 1 tablet (20 mg total) by mouth daily. 90 tablet 3  . lovastatin (MEVACOR) 40 MG tablet Take 1 tablet (40 mg total) by mouth at bedtime. 90 tablet 3  . omeprazole (PRILOSEC) 20 MG capsule Take 1 capsule (20 mg total) by mouth daily. (Patient taking differently: Take 20 mg by mouth at bedtime. ) 90 capsule 1  . SUMAtriptan (IMITREX) 100 MG tablet TAKE 1 TABLET BY MOUTH EVERY 2 HOURS AS NEEDED FOR MIGRAINE OR HEADACHE.  MAY REPEAT IN 2 HOURS IF HEADACHE PERSISTS OR RECURS. (Patient taking differently: Take 100 mg by mouth See admin instructions. Take 1 tablet (100 mg) by mouth every 2 hours as needed  for migraine or headache, may repeat in 2 hours if headache persists or recurs) 10 tablet 5  . triamcinolone cream (KENALOG) 0.1 % Apply 1 application topically 2 (two) times daily. (Patient taking differently: Apply 1 application topically daily as needed (psoriasis in ears). ) 30 g 0  . Turmeric 500 MG TABS Take 500 mg by mouth daily.     No current facility-administered medications on file prior to visit.    Review of Systems  Constitutional: Negative for other unusual diaphoresis or sweats HENT: Negative for ear discharge or swelling Eyes: Negative for other worsening visual disturbances Respiratory: Negative for stridor or other swelling  Gastrointestinal: Negative for worsening distension or other blood Genitourinary: Negative  for retention or other urinary change Musculoskeletal: Negative for other MSK pain or swelling Skin: Negative for color change or other new lesions Neurological: Negative for worsening tremors and other numbness  Psychiatric/Behavioral: Negative for worsening agitation or other fatigue ALl other system neg per pt    Objective:   Physical Exam BP 138/86   Pulse 71   Temp 98.4 F (36.9 C) (Oral)   Ht 5\' 2"  (1.575 m)   Wt 202 lb (91.6 kg)   SpO2 98%   BMI 36.95 kg/m  VS noted,  Constitutional: Pt appears in NAD HENT: Head: NCAT.  Right Ear: External ear normal.  Left Ear: External ear normal.  Eyes: . Pupils are equal, round, and reactive to light. Conjunctivae and EOM are normal Nose: without d/c or deformity Neck: Neck supple. Gross normal ROM Cardiovascular: Normal rate and regular rhythm.   Pulmonary/Chest: Effort normal and breath sounds without rales or wheezing.  Abd:  Soft, NT, ND, + BS, no organomegaly t spine with mid midline tender no rash or swelling Neurological: Pt is alert. At baseline orientation, motor grossly intact Skin: Skin is warm. No rashes, other new lesions, no LE edema Psychiatric: Pt behavior is normal without agitation   No other exam findings  Lab Results  Component Value Date   WBC 8.3 09/13/2017   HGB 12.9 09/13/2017   HCT 38.5 09/13/2017   PLT 214.0 09/13/2017   GLUCOSE 147 (H) 09/13/2017   CHOL 195 04/23/2017   TRIG 133.0 04/23/2017   HDL 51.80 04/23/2017   LDLDIRECT 94.0 11/13/2016   LDLCALC 116 (H) 04/23/2017   ALT 25 09/13/2017   AST 16 09/13/2017   NA 140 09/13/2017   K 4.4 09/13/2017   CL 105 09/13/2017   CREATININE 1.68 (H) 09/13/2017   BUN 26 (H) 09/13/2017   CO2 28 09/13/2017   TSH 0.21 (L) 04/23/2017   INR 0.99 08/17/2014   HGBA1C 6.4 04/23/2017   MICROALBUR 3.4 (H) 04/23/2017   POCT glycosylated hemoglobin (Hb A1C)  Order: [id number]  Status:  Final result Visible to patient:  No (Not Released) Dx:  Type 2 diabetes mellitus without comp...   Ref Range & Units 08:42 51mo ago 43mo ago 33yr ago  Hemoglobin A1C 4.0 - 5.6 % 6.7Abnormal   6.4 R, CM 6.9High  R, CM 7           Assessment & Plan:

## 2017-10-24 NOTE — Assessment & Plan Note (Signed)
Etiology unclear, for t spine films, MRI T spine

## 2017-10-24 NOTE — Assessment & Plan Note (Signed)
Pt no candidate for ct or mri with contrast, Also for abd u/s to assess renal and other etiology for back pain

## 2017-10-24 NOTE — Assessment & Plan Note (Signed)
stable overall by history and exam, recent data reviewed with pt, and pt to continue medical treatment as before,  to f/u any worsening symptoms or concerns, to cont ace for now in the settting of gfr over 30

## 2017-10-29 ENCOUNTER — Other Ambulatory Visit: Payer: Self-pay | Admitting: Internal Medicine

## 2017-10-29 MED ORDER — SUMATRIPTAN SUCCINATE 100 MG PO TABS: 100.0000 mg | ORAL_TABLET | ORAL | 2 refills | Status: DC

## 2017-10-29 NOTE — Telephone Encounter (Signed)
Copied from [place]. Topic: Quick Communication - Rx Refill/Question >> Oct 29, 2017 10:35 AM Neva Seat wrote: SUMAtriptan (IMITREX) 100 MG tablet  Pt needing this medication sent to new pharmacy for refills asap.   [place], [place] [address] [place] [address] Phone: [phone] Fax: [phone]

## 2017-10-29 NOTE — Telephone Encounter (Signed)
Reviewed chart pt is up-to-date sent refills to [facility].../lmb  

## 2017-10-29 NOTE — Telephone Encounter (Signed)
Refill of Imitrex  LOV 10/24/17 Dr. Jenny Reichmann  [facility] 04/23/17  #10  5 refills  [place], [place] [address] Phone: [phone] Fax: [phone]

## 2017-10-31 ENCOUNTER — Ambulatory Visit
Admission: RE | Admit: 2017-10-31 | Discharge: 2017-10-31 | Disposition: A | Discharge status: Home / Self Care | Payer: Medicare Other | Source: Ambulatory Visit | Attending: Internal Medicine | Admitting: Internal Medicine

## 2017-10-31 DIAGNOSIS — N183 Chronic kidney disease, stage 3 unspecified: Secondary | ICD-10-CM

## 2017-10-31 DIAGNOSIS — K760 Fatty (change of) liver, not elsewhere classified: Secondary | ICD-10-CM | POA: Diagnosis not present

## 2017-11-20 ENCOUNTER — Ambulatory Visit
Admission: RE | Admit: 2017-11-20 | Discharge: 2017-11-20 | Disposition: A | Discharge status: Home / Self Care | Payer: Medicare Other | Source: Ambulatory Visit | Attending: Internal Medicine | Admitting: Internal Medicine

## 2017-11-20 DIAGNOSIS — G8929 Other chronic pain: Secondary | ICD-10-CM

## 2017-11-20 DIAGNOSIS — M5124 Other intervertebral disc displacement, thoracic region: Secondary | ICD-10-CM | POA: Diagnosis not present

## 2017-11-20 DIAGNOSIS — M47814 Spondylosis without myelopathy or radiculopathy, thoracic region: Secondary | ICD-10-CM | POA: Diagnosis not present

## 2017-11-20 DIAGNOSIS — M546 Pain in thoracic spine: Principal | ICD-10-CM

## 2017-12-02 DIAGNOSIS — N183 Chronic kidney disease, stage 3 (moderate): Secondary | ICD-10-CM | POA: Diagnosis not present

## 2017-12-02 DIAGNOSIS — E559 Vitamin D deficiency, unspecified: Secondary | ICD-10-CM | POA: Diagnosis not present

## 2017-12-02 DIAGNOSIS — I129 Hypertensive chronic kidney disease with stage 1 through stage 4 chronic kidney disease, or unspecified chronic kidney disease: Secondary | ICD-10-CM | POA: Diagnosis not present

## 2017-12-02 DIAGNOSIS — N179 Acute kidney failure, unspecified: Secondary | ICD-10-CM | POA: Diagnosis not present

## 2017-12-30 ENCOUNTER — Ambulatory Visit: Payer: Self-pay | Admitting: *Deleted

## 2017-12-30 DIAGNOSIS — L400 Psoriasis vulgaris: Secondary | ICD-10-CM | POA: Diagnosis not present

## 2017-12-30 DIAGNOSIS — L570 Actinic keratosis: Secondary | ICD-10-CM | POA: Diagnosis not present

## 2017-12-30 NOTE — Telephone Encounter (Signed)
The pt called with complaints of stomach pain and diarrhea after eating; this has been going on for 2 1/2 weeks; she said when she thought it was a virus she took imodium (the 2nd day); that helped with the diarrhea but the pain continues; she has also taken Tums but has gotten no relief; the pt says that this is similar to the episode she had July 2019; recommendations made per nurse triage protocol; she is normally seen by Dr Jenny Reichmann and requests to only see this provider; pt offered and accepted appointment with Dr [name], [facility],12/31/17 at 1120; she verbalized understanding; will route to office for notification of this upcoming appointment.   Reason for Disposition . [1] MODERATE pain (e.g., interferes with normal activities) AND [2] comes and goes (cramps) AND [3] present > 24 hours  (Exception: pain with Vomiting or Diarrhea - see that Guideline)  Answer Assessment - Initial Assessment Questions 1. LOCATION: "Where does it hurt?"      Upper abdomen between breasts 2. RADIATION: "Does the pain shoot anywhere else?" (e.g., chest, back)     no 3. ONSET: "When did the pain begin?" (e.g., minutes, hours or days ago)      2 weeks ago 4. SUDDEN: "Gradual or sudden onset?"     suddenly 5. PATTERN "Does the pain come and go, or is it constant?"    - If constant: "Is it getting better, staying the same, or worsening?"      (Note: Constant means the pain never goes away completely; most serious pain is constant and it progresses)     - If intermittent: "How long does it last?" "Do you have pain now?"     (Note: Intermittent means the pain goes away completely between bouts)     intermittent 6. SEVERITY: "How bad is the pain?"  (e.g., Scale 1-10; mild, moderate, or severe)    - MILD (1-3): doesn't interfere with normal activities, abdomen soft and not tender to touch     - MODERATE (4-7): interferes with normal activities or awakens from sleep, tender to touch     - SEVERE (8-10): excruciating  pain, doubled over, unable to do any normal activities       moderate 7. RECURRENT SYMPTOM: "Have you ever had this type of abdominal pain before?" If so, ask: "When was the last time?" and "What happened that time?"      Yes July 2019 8. AGGRAVATING FACTORS: "Does anything seem to cause this pain?" (e.g., foods, stress, alcohol)     eating 9. CARDIAC SYMPTOMS: "Do you have any of the following symptoms: chest pain, difficulty breathing, sweating, nausea?"     no 10. OTHER SYMPTOMS: "Do you have any other symptoms?" (e.g., fever, vomiting, diarrhea)       Diarrhea (stools alternate between loose and watery diarrhea 11. PREGNANCY: "Is there any chance you are pregnant?" "When was your last menstrual period?"      no  Protocols used: ABDOMINAL PAIN - UPPER-A-AH

## 2017-12-31 ENCOUNTER — Ambulatory Visit: Payer: Medicare Other | Admitting: Internal Medicine

## 2017-12-31 ENCOUNTER — Other Ambulatory Visit (INDEPENDENT_AMBULATORY_CARE_PROVIDER_SITE_OTHER): Payer: Medicare Other

## 2017-12-31 VITALS — BP 128/86 | HR 81 | Temp 98.0°F | Ht 62.0 in

## 2017-12-31 DIAGNOSIS — E039 Hypothyroidism, unspecified: Secondary | ICD-10-CM

## 2017-12-31 DIAGNOSIS — R197 Diarrhea, unspecified: Secondary | ICD-10-CM | POA: Diagnosis not present

## 2017-12-31 DIAGNOSIS — N183 Chronic kidney disease, stage 3 unspecified: Secondary | ICD-10-CM

## 2017-12-31 DIAGNOSIS — E119 Type 2 diabetes mellitus without complications: Secondary | ICD-10-CM

## 2017-12-31 DIAGNOSIS — R1013 Epigastric pain: Secondary | ICD-10-CM | POA: Diagnosis not present

## 2017-12-31 LAB — BASIC METABOLIC PANEL
BUN: 27 mg/dL — ABNORMAL HIGH (ref 6–23)
CO2: 24 mEq/L (ref 19–32)
Calcium: 9.3 mg/dL (ref 8.4–10.5)
Chloride: 104 mEq/L (ref 96–112)
Creatinine, Ser: 1.9 mg/dL — ABNORMAL HIGH (ref 0.40–1.20)
GFR: 28.55 mL/min — ABNORMAL LOW (ref >60.00)
Glucose, Bld: 195 mg/dL — ABNORMAL HIGH (ref 70–99)
Potassium: 4.3 mEq/L (ref 3.5–5.1)
Sodium: 138 mEq/L (ref 135–145)

## 2017-12-31 LAB — T4, FREE: Free T4: 1.06 ng/dL (ref 0.60–1.60)

## 2017-12-31 LAB — HEPATIC FUNCTION PANEL
ALT: 26 U/L (ref 0–35)
AST: 18 U/L (ref 0–37)
Albumin: 4.4 g/dL (ref 3.5–5.2)
Alkaline Phosphatase: 72 U/L (ref 39–117)
Bilirubin, Direct: 0.1 mg/dL (ref 0.0–0.3)
Total Bilirubin: 0.4 mg/dL (ref 0.2–1.2)
Total Protein: 7 g/dL (ref 6.0–8.3)

## 2017-12-31 LAB — TSH: TSH: 0.42 u[IU]/mL (ref 0.35–4.50)

## 2017-12-31 LAB — H. PYLORI ANTIBODY, IGG: H Pylori IgG: NEGATIVE

## 2017-12-31 LAB — LIPASE: Lipase: 23 U/L (ref 11.0–59.0)

## 2017-12-31 MED ORDER — SUMATRIPTAN SUCCINATE 100 MG PO TABS: 100.0000 mg | ORAL_TABLET | ORAL | 2 refills | Status: DC

## 2017-12-31 MED ORDER — NYSTATIN 100000 UNIT/GM EX POWD: Freq: Four times a day (QID) | CUTANEOUS | 1 refills | Status: DC

## 2017-12-31 NOTE — Assessment & Plan Note (Signed)
stable overall by history and exam, recent data reviewed with pt, and pt to continue medical treatment as before,  to f/u any worsening symptoms or concerns  

## 2017-12-31 NOTE — Patient Instructions (Signed)
Please take all new medication as prescribed - the powder for the rash  Please continue all other medications as before, and refills have been done if requested.  Please have the pharmacy call with any other refills you may need.  Please continue your efforts at being more active, low cholesterol diet, and weight control.  Please keep your appointments with your specialists as you may have planned  Please go to the LAB in the Basement (turn left off the elevator) for the tests to be done today  You will be contacted by phone if any changes need to be made immediately.  Otherwise, you will receive a letter about your results with an explanation, but please check with MyChart first.  Please remember to sign up for MyChart if you have not done so, as this will be important to you in the future with finding out test results, communicating by private email, and scheduling acute appointments online when needed.

## 2017-12-31 NOTE — Assessment & Plan Note (Signed)
Etiology unclear, for stool cx and GI panel,  to f/u any worsening symptoms or concerns

## 2017-12-31 NOTE — Assessment & Plan Note (Signed)
To cont renal f/u as planned

## 2017-12-31 NOTE — Assessment & Plan Note (Signed)
Etiology unclear, for labs as ordered, change prilosec to protonix 40 qd, and consider GI referral

## 2017-12-31 NOTE — Progress Notes (Signed)
Subjective:    Patient ID: Heather Dawson, female    DOB: [birth year], 62 y.o.   MRN: [id number]  HPI  Here to f/u with 2.5 wks epigastric pain and diarrhea; pain is mild to mod, constant, with nausea but no vomiting, radiation, fever, blood.  Has loose and watery stools about 5 times per day as well. Just had basic routine labs per renal in oct 2019.  Seems to have gained wt.  Pt denies chest pain, increased sob or doe, wheezing, orthopnea, PND, increased LE swelling, palpitations, dizziness or syncope.   Pt denies polydipsia, polyuria Wt Readings from Last 3 Encounters:  10/24/17 202 lb (91.6 kg)  09/13/17 194 lb 1.3 oz (88 kg)  09/10/17 195 lb (88.5 kg)   Past Medical History:  Diagnosis Date  . Abdominal pain, epigastric 12/20/2006  . ALLERGIC RHINITIS 12/25/2006  . Allergy   . ANXIETY 10/16/2006   no per pt  . Arthritis    right shoulder  . BACK PAIN 06/15/2008  . Blood transfusion without reported diagnosis   . BRADYCARDIA 05/04/2008  . Carpal tunnel syndrome 10/16/2006  . Cellulitis and abscess of other specified site 06/15/2008  . CHEST PAIN 09/08/2009  . Chronic low back pain   . CIRRHOSIS 10/16/2006  . CKD (chronic kidney disease)    2008- had Ecoli and caused renal failure, no problems since then  . CKD (chronic kidney disease) stage 3, GFR 30-59 ml/min (HCC) 10/16/2006   Qualifier: Diagnosis of  By: Jenny Reichmann MD, [place] 12/20/2006  . Complication of anesthesia    awake during 2 surgeries  . CONTUSION, LOWER LEG 09/15/2008  . DEPRESSION 10/16/2006   no per pt  . Esophageal reflux 12/25/2006  . HYPERLIPIDEMIA 12/25/2006  . Hyperlipidemia   . HYPERTENSION 08/15/2006  . Hypertension   . HYPOTHYROIDISM 08/15/2006  . Kidney failure, acute ([place]) 2009   as a result of severe E Coli infection  . Left-sided carotid artery disease ([place])   . MIGRAINE HEADACHE 10/16/2006  . Obesity 10/16/2006  . OVARIAN CYST 12/20/2006  . Psoriasis 01/03/2011  . SHINGLES, HX OF  12/20/2006  . Stroke [facility])    3 strokes 2008-no deficits, only on ASA  . TRANSIENT ISCHEMIC ATTACKS, HX OF 12/20/2006  . VERTIGO 09/15/2007  . VITAMIN B12 DEFICIENCY 01/09/2007   Past Surgical History:  Procedure Laterality Date  . ABDOMINAL HYSTERECTOMY  02/2006  . ANTERIOR CERVICAL DECOMP/DISCECTOMY FUSION  2005  . CESAREAN SECTION    . CHOLECYSTECTOMY    . COLONOSCOPY    . ENDARTERECTOMY Left 08/25/2014   Procedure: LEFT CAROTID ENDARTERECTOMY WITH HEMASHIELD PATCH ANGIOPLASTY;  Surgeon: Elam Dutch, MD;  Location: [facility];  Service: Vascular;  Laterality: Left;  . NECK SURGERY  2005  . ROTATOR CUFF REPAIR Right 02/2014   Dr Veverly Fells    reports that she has quit smoking. She has never used smokeless tobacco. She reports that she does not drink alcohol or use drugs. family history includes Diabetes in her father, other, and sister; Heart disease in her brother, brother, father, and mother; Hypertension in her brother, brother, and other; Lupus in her mother; Raynaud syndrome in her mother; Stroke in her father. Allergies  Allergen Reactions  . Cefuroxime Axetil Swelling     edema/swelling  . Ciprofloxacin Swelling  . Levaquin [Levofloxacin In D5w] Other (See Comments)    GI upset  . Lipitor [Atorvastatin] Other (See Comments)    Myalgia   .  Lyrica [Pregabalin] Other (See Comments)    HA, confusion, swelling  . Macrodantin [Nitrofurantoin Macrocrystal] Other (See Comments)    GI upset, diarrhea and sob  . Gabapentin Rash   Current Outpatient Medications on File Prior to Visit  Medication Sig Dispense Refill  . acetaminophen (TYLENOL) 500 MG tablet Take 1,000 mg by mouth every 8 (eight) hours as needed (pain).    Marland Kitchen amLODipine (NORVASC) 10 MG tablet Take 1 tablet (10 mg total) by mouth daily. (Patient taking differently: Take 10 mg by mouth daily before breakfast. ) 90 tablet 1  . aspirin 325 MG EC tablet Take 325 mg by mouth daily.      . calcium carbonate (OSCAL) 1500 (600 Ca)  MG TABS tablet Take by mouth 2 (two) times daily with a meal.    . Cholecalciferol (VITAMIN D3) 1000 units CAPS Take 2 capsules (2,000 Units total) by mouth daily. 60 capsule 0  . clobetasol (TEMOVATE) 0.05 % external solution Apply 1 application topically daily as needed (scalp psoriasis).   0  . diclofenac sodium (VOLTAREN) 1 % GEL Apply 4 g topically 4 (four) times daily as needed. (Patient taking differently: Apply 1 application topically daily as needed (shoulder pain). ) 400 g 11  . levocetirizine (XYZAL) 5 MG tablet     . levothyroxine (SYNTHROID, LEVOTHROID) 100 MCG tablet Take 1 tablet (100 mcg total) by mouth daily before breakfast. 90 tablet 1  . lisinopril (PRINIVIL,ZESTRIL) 20 MG tablet Take 1 tablet (20 mg total) by mouth daily. 90 tablet 3  . lovastatin (MEVACOR) 40 MG tablet Take 1 tablet (40 mg total) by mouth at bedtime. 90 tablet 3  . omeprazole (PRILOSEC) 20 MG capsule Take 1 capsule (20 mg total) by mouth daily. (Patient taking differently: Take 20 mg by mouth at bedtime. ) 90 capsule 1  . triamcinolone cream (KENALOG) 0.1 % Apply 1 application topically 2 (two) times daily. (Patient taking differently: Apply 1 application topically daily as needed (psoriasis in ears). ) 30 g 0  . Turmeric 500 MG TABS Take 500 mg by mouth daily.     No current facility-administered medications on file prior to visit.    Review of Systems  Constitutional: Negative for other unusual diaphoresis or sweats HENT: Negative for ear discharge or swelling Eyes: Negative for other worsening visual disturbances Respiratory: Negative for stridor or other swelling  Gastrointestinal: Negative for worsening distension or other blood Genitourinary: Negative for retention or other urinary change Musculoskeletal: Negative for other MSK pain or swelling Skin: Negative for color change or other new lesions Neurological: Negative for worsening tremors and other numbness  Psychiatric/Behavioral: Negative for  worsening agitation or other fatigue All other system neg per pt    Objective:   Physical Exam BP 128/86   Pulse 81   Temp 98 F (36.7 C) (Oral)   Ht 5\' 2"  (1.575 m)   SpO2 95%   BMI 36.95 kg/m  VS noted, not ill appearing Constitutional: Pt appears in NAD HENT: Head: NCAT.  Right Ear: External ear normal.  Left Ear: External ear normal.  Eyes: . Pupils are equal, round, and reactive to light. Conjunctivae and EOM are normal Nose: without d/c or deformity Neck: Neck supple. Gross normal ROM Cardiovascular: Normal rate and regular rhythm.   Pulmonary/Chest: Effort normal and breath sounds without rales or wheezing.  Abd:  Soft, mild epigastric tender, ND, + BS, no organomegaly Neurological: Pt is alert. At baseline orientation, motor grossly intact Skin: Skin is warm.  No rashes, other new lesions, no LE edema Psychiatric: Pt behavior is normal without agitation  No other exam findings  Lab Results  Component Value Date   WBC 8.3 09/13/2017   HGB 12.9 09/13/2017   HCT 38.5 09/13/2017   PLT 214.0 09/13/2017   GLUCOSE 147 (H) 09/13/2017   CHOL 195 04/23/2017   TRIG 133.0 04/23/2017   HDL 51.80 04/23/2017   LDLDIRECT 94.0 11/13/2016   LDLCALC 116 (H) 04/23/2017   ALT 25 09/13/2017   AST 16 09/13/2017   NA 140 09/13/2017   K 4.4 09/13/2017   CL 105 09/13/2017   CREATININE 1.68 (H) 09/13/2017   BUN 26 (H) 09/13/2017   CO2 28 09/13/2017   TSH 0.21 (L) 04/23/2017   INR 0.99 08/17/2014   HGBA1C 6.7 (A) 10/24/2017   HGBA1C 0 10/24/2017   HGBA1C 0 (A) 10/24/2017   HGBA1C 0.0 10/24/2017   MICROALBUR 3.4 (H) 04/23/2017        Assessment & Plan:

## 2018-01-01 ENCOUNTER — Other Ambulatory Visit: Payer: Medicare Other

## 2018-01-01 DIAGNOSIS — R197 Diarrhea, unspecified: Secondary | ICD-10-CM | POA: Diagnosis not present

## 2018-01-01 DIAGNOSIS — E119 Type 2 diabetes mellitus without complications: Secondary | ICD-10-CM | POA: Diagnosis not present

## 2018-01-01 DIAGNOSIS — R1013 Epigastric pain: Secondary | ICD-10-CM

## 2018-01-02 ENCOUNTER — Telehealth: Payer: Self-pay | Admitting: Internal Medicine

## 2018-01-02 MED ORDER — VANCOMYCIN HCL 250 MG PO CAPS: 250.0000 mg | ORAL_CAPSULE | Freq: Four times a day (QID) | ORAL | 0 refills | Status: DC

## 2018-01-03 ENCOUNTER — Telehealth: Payer: Self-pay

## 2018-01-03 ENCOUNTER — Telehealth: Payer: Self-pay | Admitting: Internal Medicine

## 2018-01-03 MED ORDER — NYSTATIN 100000 UNIT/GM EX POWD: Freq: Four times a day (QID) | CUTANEOUS | 1 refills | Status: DC

## 2018-01-03 MED ORDER — VANCOMYCIN HCL 250 MG/5ML PO SOLR: 5.0000 mL | Freq: Four times a day (QID) | ORAL | 0 refills | Status: DC

## 2018-01-03 NOTE — Telephone Encounter (Signed)
Heather Dawson with [facility] called to inform the doctor that the script for the liquid form of Vancomycin is not covered under the insurance.  Pharmacy would like to know if there is an alternative that the patient could take that will be covered.  Please advise.

## 2018-01-03 NOTE — Addendum Note (Signed)
Addended by: Biagio Borg on: 01/03/2018 10:39 AM   Modules accepted: Orders

## 2018-01-03 NOTE — Telephone Encounter (Signed)
Copied from [place] [id number]. Topic: Quick Communication - See Telephone Encounter >> Jan 03, 2018  9:42 AM Blase Mess A wrote: CRM for notification. See Telephone encounter for: 01/03/18.  Patient is calling because the patient was in to see Dr.John 12/31/17  the anti fungal medication was not received by the Heather Dawson, Heather Dawson - [address] [place] [address] Phone: [phone] Fax: [phone]  Can the script be resent?  Also, the antibiotic that was called in is $150 can something cheaper be called in for her C diff.

## 2018-01-03 NOTE — Telephone Encounter (Signed)
Gerald Stabs from [employer] back.  Liquid medication is not covered under insurance either and they need to do know what to do for pt. Gerald Stabs can be reached at [phone]

## 2018-01-03 NOTE — Telephone Encounter (Signed)
Nystatin powder resent  Ok to change the vanc tabs to soln due to cost  - done erx

## 2018-01-03 NOTE — Telephone Encounter (Signed)
-----   Message from Biagio Borg, MD sent at 01/02/2018  9:06 PM EST ----- Left message on MyChart, pt to cont same tx except  The test results show that your current treatment is OK, except the test is positive for the toxin assoc with C diff.  Though is not absolutely definitive, it is highly suggestive of infection with C diff (a kind of stronger resistant kind of bacteria).  We should treat with a medication called oral Vancomycin.  I will send the prescription, and you should hear from the office as well.Redmond Baseman to please inform pt, I will do rx

## 2018-01-03 NOTE — Telephone Encounter (Signed)
Pt has viewed results via MyChart  

## 2018-01-03 NOTE — Telephone Encounter (Signed)
none

## 2018-01-03 NOTE — Telephone Encounter (Signed)
Please review for alternative Rx.

## 2018-01-03 NOTE — Telephone Encounter (Signed)
May need a PA for c diff colitis - please try this today as she needs asap

## 2018-01-03 NOTE — Telephone Encounter (Signed)
Key: [id number]

## 2018-01-05 LAB — STOOL CULTURE
MICRO NUMBER:: 91367165
MICRO NUMBER:: 91367166
MICRO NUMBER:: 91367167
SHIGA RESULT:: NOT DETECTED
SPECIMEN QUALITY:: ADEQUATE
SPECIMEN QUALITY:: ADEQUATE
SPECIMEN QUALITY:: ADEQUATE

## 2018-01-05 LAB — GASTROINTESTINAL PATHOGEN PANEL PCR
C. difficile Tox A/B, PCR: DETECTED — AB
Campylobacter, PCR: NOT DETECTED
Cryptosporidium, PCR: NOT DETECTED
E coli (ETEC) LT/ST PCR: NOT DETECTED
E coli (STEC) stx1/stx2, PCR: NOT DETECTED
E coli 0157, PCR: NOT DETECTED
Giardia lamblia, PCR: NOT DETECTED
Norovirus, PCR: NOT DETECTED
Rotavirus A, PCR: NOT DETECTED
Salmonella, PCR: NOT DETECTED
Shigella, PCR: NOT DETECTED

## 2018-01-06 ENCOUNTER — Other Ambulatory Visit: Payer: Self-pay | Admitting: Internal Medicine

## 2018-01-06 ENCOUNTER — Telehealth: Payer: Self-pay

## 2018-01-06 DIAGNOSIS — A0472 Enterocolitis due to Clostridium difficile, not specified as recurrent: Secondary | ICD-10-CM

## 2018-01-06 MED ORDER — METRONIDAZOLE 500 MG PO TABS: 500.0000 mg | ORAL_TABLET | Freq: Three times a day (TID) | ORAL | 0 refills | Status: DC

## 2018-01-06 NOTE — Telephone Encounter (Signed)
PA has been denied. Please advise.

## 2018-01-06 NOTE — Telephone Encounter (Signed)
Please let her know this is very good b/c the flagyl is considered second or third best treatment now; thanks

## 2018-01-06 NOTE — Telephone Encounter (Signed)
-----   Message from Biagio Borg, MD sent at 01/06/2018 12:18 PM EST ----- Left message on MyChart, pt to cont same tx except  The test results show that your current treatment is OK, as the stool culture is negative.  We should still treat as per the other GI panel testing for C Diff.  I would also like to refer to GI for followup as sometimes it gets complicated with recurring episodes.    Sheyna Pettibone to please inform pt, I will do referral

## 2018-01-06 NOTE — Telephone Encounter (Signed)
Ok to let pt know we changed oral vancomycin to flagyl due to her insurance  She has also been referred to GI

## 2018-01-06 NOTE — Telephone Encounter (Signed)
Pt has informed and expressed understanding. She said she went ahead and picked up the vancomycin over the weekend and started that. She will look out for phone call from GI to set up appt

## 2018-01-06 NOTE — Addendum Note (Signed)
Addended by: Biagio Borg on: 01/06/2018 12:39 PM   Modules accepted: Orders

## 2018-01-06 NOTE — Telephone Encounter (Signed)
Pt has been informed of results and expressed understanding.  °

## 2018-01-07 ENCOUNTER — Telehealth: Payer: Self-pay | Admitting: Internal Medicine

## 2018-01-07 NOTE — Telephone Encounter (Signed)
No problem. Thanks 

## 2018-01-07 NOTE — Telephone Encounter (Signed)
Hi Dr. Henrene Pastor, this pt used to see Dr. Deatra Ina. She would like to be your patient since he retired. She stated that her husband is a patient of yours. Is it ok to schedule an appt with you? Thank you.

## 2018-01-07 NOTE — Telephone Encounter (Signed)
Ov scheduled on 01/10/18 at 9:00am

## 2018-01-10 ENCOUNTER — Ambulatory Visit: Payer: Medicare Other | Admitting: Internal Medicine

## 2018-01-10 ENCOUNTER — Encounter: Payer: Self-pay | Admitting: Internal Medicine

## 2018-01-10 VITALS — BP 160/90 | HR 72 | Ht 62.0 in | Wt 199.4 lb

## 2018-01-10 DIAGNOSIS — N183 Chronic kidney disease, stage 3 (moderate): Secondary | ICD-10-CM | POA: Diagnosis not present

## 2018-01-10 DIAGNOSIS — A0472 Enterocolitis due to Clostridium difficile, not specified as recurrent: Secondary | ICD-10-CM

## 2018-01-10 NOTE — Patient Instructions (Signed)
Please finish your Vancomycin and follow up as needed

## 2018-01-10 NOTE — Progress Notes (Signed)
HISTORY OF PRESENT ILLNESS:  Heather Dawson is a 62 y.o. female who is referred today by her primary care provider Dr. Jenny Reichmann regarding recently diagnosed C. difficile associated diarrhea.  Patient is accompanied today by her husband.  Prior patient of Dr. Deatra Ina.  Has not been seen in this facility in over 4 years.  In any event, she was in the emergency room in July with complaints of diarrhea.  Had already been started on metronidazole.  Told to continue.  Problems apparently resolved until about 3 weeks ago when she developed problems with diarrhea.  Described as watery and multiple times per day, particularly after meals.  Some urgency.  She saw Dr. Jenny Reichmann last week.  Laboratories from December 31, 2017 reveal chronic renal insufficiency with BUN 27 and creatinine 1.9.  Patient has one kidney.  Elevated glucose at 195.  Previous hemoglobin A1c 6.7 though she tells me she is managing her diabetes with diet only.  GI pathogen panel returned positive for C. difficile toxin A/B, PCR.  She was initiated on vancomycin 250 mg 4 times daily.  Her diarrhea has slowly improved.  She was given a 10-day course.  She did have abdominal ultrasound October 31, 2017 which revealed fatty liver and prior cholecystectomy.  She did undergo complete colonoscopy August 2015.  Tubular adenoma removed.  Previous upper endoscopy in 2011 was normal.  Patient does take Prilosec for GERD.  REVIEW OF SYSTEMS:  All non-GI ROS negative less otherwise stated in the HPI except for sinus and allergy, back pain, cough, ankle swelling  Past Medical History:  Diagnosis Date  . Abdominal pain, epigastric 12/20/2006  . ALLERGIC RHINITIS 12/25/2006  . Allergy   . ANXIETY 10/16/2006   no per pt  . Arthritis    right shoulder  . BACK PAIN 06/15/2008  . Blood transfusion without reported diagnosis   . BRADYCARDIA 05/04/2008  . C. difficile diarrhea   . Carpal tunnel syndrome 10/16/2006  . Cellulitis and abscess of other specified site  06/15/2008  . CHEST PAIN 09/08/2009  . Chronic low back pain   . CIRRHOSIS 10/16/2006  . CKD (chronic kidney disease)    2008- had Ecoli and caused renal failure, no problems since then  . CKD (chronic kidney disease) stage 3, GFR 30-59 ml/min (HCC) 10/16/2006   Qualifier: Diagnosis of  By: Jenny Reichmann MD, [place] 12/20/2006  . Complication of anesthesia    awake during 2 surgeries  . CONTUSION, LOWER LEG 09/15/2008  . DEPRESSION 10/16/2006   no per pt  . Esophageal reflux 12/25/2006  . HYPERLIPIDEMIA 12/25/2006  . Hyperlipidemia   . HYPERTENSION 08/15/2006  . Hypertension   . HYPOTHYROIDISM 08/15/2006  . Kidney failure, acute ([place]) 2009   as a result of severe E Coli infection  . Left-sided carotid artery disease ([place])   . MIGRAINE HEADACHE 10/16/2006  . Obesity 10/16/2006  . OVARIAN CYST 12/20/2006  . Psoriasis 01/03/2011  . SHINGLES, HX OF 12/20/2006  . Stroke [facility])    3 strokes 2008-no deficits, only on ASA  . TRANSIENT ISCHEMIC ATTACKS, HX OF 12/20/2006  . VERTIGO 09/15/2007  . VITAMIN B12 DEFICIENCY 01/09/2007    Past Surgical History:  Procedure Laterality Date  . ABDOMINAL HYSTERECTOMY  02/2006  . ANTERIOR CERVICAL DECOMP/DISCECTOMY FUSION  2005  . CESAREAN SECTION    . CHOLECYSTECTOMY    . COLONOSCOPY    . ENDARTERECTOMY Left 08/25/2014   Procedure: LEFT CAROTID ENDARTERECTOMY WITH HEMASHIELD PATCH  ANGIOPLASTY;  Surgeon: Elam Dutch, MD;  Location: [facility];  Service: Vascular;  Laterality: Left;  . NECK SURGERY  2005  . ROTATOR CUFF REPAIR Right 02/2014   Dr Veverly Fells    Social History Michae Kava Dralle  reports that she has quit smoking. She has never used smokeless tobacco. She reports that she does not drink alcohol or use drugs.  family history includes Diabetes in her father, other, and sister; Heart disease in her brother, brother, father, and mother; Hypertension in her brother, brother, and other; Lupus in her mother; Raynaud syndrome in her mother; Stroke  in her father.  Allergies  Allergen Reactions  . Cefuroxime Axetil Swelling     edema/swelling  . Ciprofloxacin Swelling  . Levaquin [Levofloxacin In D5w] Other (See Comments)    GI upset  . Lipitor [Atorvastatin] Other (See Comments)    Myalgia   . Lyrica [Pregabalin] Other (See Comments)    HA, confusion, swelling  . Macrodantin [Nitrofurantoin Macrocrystal] Other (See Comments)    GI upset, diarrhea and sob  . Gabapentin Rash       PHYSICAL EXAMINATION: Vital signs: BP (!) 160/90 (BP Location: Left Arm, Patient Position: Sitting, Cuff Size: Normal)   Pulse 72   Ht 5\' 2"  (1.575 m) Comment: height from previous visits  Wt 199 lb 6 oz (90.4 kg)   BMI 36.47 kg/m   Constitutional: generally well-appearing, no acute distress Psychiatric: alert and oriented x3, cooperative Eyes: extraocular movements intact, anicteric, conjunctiva pink Mouth: oral pharynx moist, no lesions Neck: supple no lymphadenopathy Cardiovascular: heart regular rate and rhythm, no murmur Lungs: clear to auscultation bilaterally Abdomen: soft, nontender, nondistended, no obvious ascites, no peritoneal signs, normal bowel sounds, no organomegaly Rectal: Omitted Extremities: no clubbing or cyanosis.  Trace lower extremity edema bilaterally Skin: no lesions on visible extremities Neuro: No focal deficits.  Cranial nerves intact    ASSESSMENT:  1.  C. difficile associated diarrhea.  No obvious risk factors other than chronic PPI use possibly 2.  GERD.  Requiring PPI to control symptoms 3.  Diabetes, obesity. 4.  History of adenomatous colon polyps.  Due for follow-up routine surveillance around August 2020.  She is aware 5.  Multiple medical problems  PLAN:  1.  Complete 10-day course of vancomycin 250 mg 4 times daily. 2.  Advised with regards to relapse rates.  Should she have recurrent diarrhea for more than 2 days, I have asked her to contact this office 3.  Surveillance colonoscopy August 2020  or thereabouts 4.  Reflux precautions 5.  Continue PPI for now 6.  Ongoing general medical care with Dr. Jenny Reichmann

## 2018-01-13 ENCOUNTER — Telehealth: Payer: Self-pay

## 2018-01-13 NOTE — Telephone Encounter (Signed)
Routing to dr john---patient had first shingrix vaccine injection on 04/23/17---she never came back for 2nd injection---i'm not sure if she had a sensitivity reaction (cant find documentation of that if it happened)---is it too late for patient to get 2nd injection? I will call and advise her to come back for 2nd injection if time frame is ok (should be given within 6 months per CDC guidelines)--please advise, I will call patient back, thanks

## 2018-01-13 NOTE — Telephone Encounter (Signed)
No it shouldn't be too late, as the second injection is only to boost the efficacy of the first injection to over 90% in most patients, and even if late the shot should have a good chance of doing this

## 2018-01-13 NOTE — Telephone Encounter (Signed)
After talking with patient, she is medicare primary and went to pharmacy to get second injection

## 2018-01-20 ENCOUNTER — Telehealth: Payer: Self-pay

## 2018-01-20 ENCOUNTER — Telehealth: Payer: Self-pay | Admitting: Internal Medicine

## 2018-01-20 NOTE — Telephone Encounter (Signed)
Dr. Jenny Reichmann please advise.   Copied from [place] [id number]. Topic: General - Other >> Jan 20, 2018  8:19 AM Carolyn Stare wrote:  Pt call to say she had cdiff and finish her antibiotic last Wednesday she is asking if Dr Jenny Reichmann need to to recheck her. Would like a call back

## 2018-01-20 NOTE — Telephone Encounter (Signed)
No need to check if no fever, abd pain or diarrhea; ok to watch for now, but remember it can actually come back in some person in a few weeks to months sometimes

## 2018-01-20 NOTE — Telephone Encounter (Signed)
Called pt, LVM.   

## 2018-01-21 ENCOUNTER — Telehealth: Payer: Self-pay | Admitting: Internal Medicine

## 2018-01-21 ENCOUNTER — Other Ambulatory Visit: Payer: Self-pay

## 2018-01-21 MED ORDER — VANCOMYCIN HCL 250 MG PO CAPS: 500.0000 mg | ORAL_CAPSULE | Freq: Four times a day (QID) | ORAL | 0 refills | Status: AC

## 2018-01-21 MED ORDER — VANCOMYCIN HCL 250 MG/5ML PO SOLR: 5.0000 mL | Freq: Four times a day (QID) | ORAL | 0 refills | Status: DC

## 2018-01-21 MED ORDER — FIDAXOMICIN 200 MG PO TABS: 200.0000 mg | ORAL_TABLET | Freq: Two times a day (BID) | ORAL | 0 refills | Status: DC

## 2018-01-21 NOTE — Telephone Encounter (Signed)
Patient aware of the plan. Reviewed hygiene practices. Pharmacy contacted and reviewed change of prescription.

## 2018-01-21 NOTE — Telephone Encounter (Signed)
Completed her course of Vancomycin 01/15/18. States she never stopped having unformed bowel movements. Within a day she began having watery bowel movements more than 8 times a day. She has had incontinent stools.  She also contacted Dr Jenny Reichmann who has refilled the Vanco and asked her to follow up with [place].

## 2018-01-21 NOTE — Telephone Encounter (Signed)
Spoke to patient, she stated that she is still having diarrhea that ranges from soft stool to loose. She stated that she is using the bathroom up to 8 times a day and without knowing at times on herself. Pt is also complaining of lower back pain that she is concerned about that is very painful and hard for her to move.   Please advise.

## 2018-01-21 NOTE — Telephone Encounter (Signed)
Pt has been informed and stated that she spoke to GI shortly after our conversation earlier. She stated that the GI nurse was going to speak with Dr. Henrene Pastor about treatment for her and call her back with a follow up.

## 2018-01-21 NOTE — Addendum Note (Signed)
Addended by: Biagio Borg on: 01/21/2018 08:24 AM   Modules accepted: Orders

## 2018-01-21 NOTE — Telephone Encounter (Signed)
Fidaxomicin 200 mg twice daily for 2 weeks.  This may be expensive as well.  Sorry but she needs treated

## 2018-01-21 NOTE — Telephone Encounter (Signed)
1.  Increase vancomycin to 500 mg 4 times daily for 2 weeks

## 2018-01-21 NOTE — Telephone Encounter (Signed)
[place] for repeat vanco, and f/u GI as referred, thanks  Done erx

## 2018-01-21 NOTE — Telephone Encounter (Signed)
Spoke with pharmacist. Dificid is over $1000 and not an option. Spoke with the patient. She will try buying the Vanco in a smaller quantity and refill it to successfully take it for the prescribed 14 days.

## 2018-01-21 NOTE — Telephone Encounter (Signed)
She cannot afford the medication.  I called the pharmacy. The cost quoted is after her Bruce pays. Liquid is not covered any better. The price was doubled from the last time due to quantity. Suggestions?

## 2018-01-23 ENCOUNTER — Other Ambulatory Visit: Payer: Self-pay

## 2018-01-23 DIAGNOSIS — I6522 Occlusion and stenosis of left carotid artery: Secondary | ICD-10-CM

## 2018-01-27 ENCOUNTER — Telehealth: Payer: Self-pay | Admitting: Internal Medicine

## 2018-01-27 NOTE — Telephone Encounter (Signed)
At this point, 1 Imodium per day would be fine but stop when stools are reasonably firm

## 2018-01-27 NOTE — Telephone Encounter (Signed)
Pt has been on the higher dose of vanco since 01/21/18. Reports she has noticed on Saturday that she has started having mushy stools, this am she describes it as soft-loose stool. Discussed with pt that it sounds like she is improving. Pt is not taking any Imodium, may pt begin taking Imodium? Please advise.

## 2018-01-27 NOTE — Telephone Encounter (Signed)
Left message for pt to call back  °

## 2018-01-28 NOTE — Telephone Encounter (Signed)
Spoke with pt and she is aware.

## 2018-01-29 ENCOUNTER — Telehealth: Payer: Self-pay | Admitting: Internal Medicine

## 2018-01-29 NOTE — Telephone Encounter (Signed)
Pt states she will finish her vancomycin on Monday for Cdiff. Wants to know if she can be retested to see if clear so she can see her family. Please advise.

## 2018-01-30 NOTE — Telephone Encounter (Signed)
Spoke with pt and she is aware.

## 2018-01-30 NOTE — Telephone Encounter (Signed)
We do not retest for C. difficile after treatment (the test can be positive when patients are clinically well).  We do go by symptoms.  If her diarrhea has resolved, nothing further to do.  If it persists or returns, contact the office as she may require a more prolonged course of therapy.  Thanks

## 2018-02-07 ENCOUNTER — Ambulatory Visit (HOSPITAL_COMMUNITY)
Admission: RE | Admit: 2018-02-07 | Discharge: 2018-02-07 | Disposition: A | Discharge status: Home / Self Care | Payer: Medicare Other | Source: Ambulatory Visit | Attending: Vascular Surgery | Admitting: Vascular Surgery

## 2018-02-07 ENCOUNTER — Ambulatory Visit: Payer: Medicare Other | Admitting: Family

## 2018-02-07 ENCOUNTER — Encounter: Payer: Self-pay | Admitting: Family

## 2018-02-07 VITALS — BP 118/74 | HR 66 | Temp 96.9°F | Resp 14 | Ht 62.0 in | Wt 195.0 lb

## 2018-02-07 DIAGNOSIS — I6522 Occlusion and stenosis of left carotid artery: Secondary | ICD-10-CM | POA: Diagnosis not present

## 2018-02-07 DIAGNOSIS — Z9889 Other specified postprocedural states: Secondary | ICD-10-CM | POA: Diagnosis not present

## 2018-02-07 DIAGNOSIS — I6523 Occlusion and stenosis of bilateral carotid arteries: Secondary | ICD-10-CM | POA: Diagnosis not present

## 2018-02-07 NOTE — Progress Notes (Signed)
Chief Complaint: Follow up Extracranial Carotid Artery Stenosis   History of Present Illness  Heather Dawson is a 62 y.o. female who is s/p left carotid endarterectomy in 2016 by Dr Oneida Alar.    She had 1 stroke and 2 TIA's in 2006 prior to the left CEA, had E. Coli at that time also. Her symptoms were left hemiparesis and slurred speech, no visual changes. 325 mg daily ASA was started at that time. She denies any residual neurological deficits.  She denies any subsequent strokes or TIA's.   She denies any symptoms of claudication in her lower extremities.   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 She continues to see Dr. Marval Regal, nephrologist. Last serum creatinine result was 1.9, GFR was 28.55 on 12-31-17.   She states her physical activity is limited by her back issues.  She has tried water aerobics but states this made her pain worse. She states she has had extensive evaluation of her back.    Diabetic: yes, A1C was 6.7 on 10-24-17 Tobacco use: former smoker, quit at age 33, smoked lightly for a couple of years  Pt meds include: Statin : yes ASA: yes, 325 mg daily  Other anticoagulants/antiplatelets: no     Past Medical History:  Diagnosis Date  . Abdominal pain, epigastric 12/20/2006  . ALLERGIC RHINITIS 12/25/2006  . Allergy   . ANXIETY 10/16/2006   no per pt  . Arthritis    right shoulder  . BACK PAIN 06/15/2008  . Blood transfusion without reported diagnosis   . BRADYCARDIA 05/04/2008  . C. difficile diarrhea   . Carpal tunnel syndrome 10/16/2006  . Cellulitis and abscess of other specified site 06/15/2008  . CHEST PAIN 09/08/2009  . Chronic low back pain   . CIRRHOSIS 10/16/2006  . CKD (chronic kidney disease)    2008- had Ecoli and caused renal failure, no problems since then  . CKD (chronic kidney disease) stage 3, GFR 30-59 ml/min (HCC) 10/16/2006   Qualifier: Diagnosis of  By: Jenny Reichmann MD, [place]  12/20/2006  . Complication of anesthesia    awake during 2 surgeries  . CONTUSION, LOWER LEG 09/15/2008  . DEPRESSION 10/16/2006   no per pt  . Esophageal reflux 12/25/2006  . HYPERLIPIDEMIA 12/25/2006  . Hyperlipidemia   . HYPERTENSION 08/15/2006  . Hypertension   . HYPOTHYROIDISM 08/15/2006  . Kidney failure, acute ([place]) 2009   as a result of severe E Coli infection  . Left-sided carotid artery disease ([place])   . MIGRAINE HEADACHE 10/16/2006  . Obesity 10/16/2006  . OVARIAN CYST 12/20/2006  . Psoriasis 01/03/2011  . SHINGLES, HX OF 12/20/2006  . Stroke [facility])    3 strokes 2008-no deficits, only on ASA  . TRANSIENT ISCHEMIC ATTACKS, HX OF 12/20/2006  . VERTIGO 09/15/2007  . VITAMIN B12 DEFICIENCY 01/09/2007    Social History Social History   Tobacco Use  . Smoking status: Former Research scientist (life sciences)  . Smokeless tobacco: Never Used  . Tobacco comment: quit smoking over 30 years ago  Substance Use Topics  . Alcohol use: No  . Drug use: No    Family History Family History  Problem Relation Age of Onset  . Stroke Father   . Heart disease Father   . Diabetes Father   . Lupus Mother   . Raynaud syndrome Mother   . Heart disease Mother   . Diabetes Sister   . Heart disease Brother   .  Hypertension Brother   . Heart disease Brother   . Hypertension Brother   . Diabetes Other   . Hypertension Other   . Colon cancer Neg Hx   . Esophageal cancer Neg Hx   . Rectal cancer Neg Hx   . Stomach cancer Neg Hx     Surgical History Past Surgical History:  Procedure Laterality Date  . ABDOMINAL HYSTERECTOMY  02/2006  . ANTERIOR CERVICAL DECOMP/DISCECTOMY FUSION  2005  . CESAREAN SECTION    . CHOLECYSTECTOMY    . COLONOSCOPY    . ENDARTERECTOMY Left 08/25/2014   Procedure: LEFT CAROTID ENDARTERECTOMY WITH HEMASHIELD PATCH ANGIOPLASTY;  Surgeon: Elam Dutch, MD;  Location: [place];  Service: Vascular;  Laterality: Left;  . NECK SURGERY  2005  . ROTATOR CUFF REPAIR Right 02/2014   Dr Veverly Fells     Allergies  Allergen Reactions  . Cefuroxime Axetil Swelling     edema/swelling  . Ciprofloxacin Swelling  . Levaquin [Levofloxacin In D5w] Other (See Comments)    GI upset  . Lipitor [Atorvastatin] Other (See Comments)    Myalgia   . Lyrica [Pregabalin] Other (See Comments)    HA, confusion, swelling  . Macrodantin [Nitrofurantoin Macrocrystal] Other (See Comments)    GI upset, diarrhea and sob  . Gabapentin Rash    Current Outpatient Medications  Medication Sig Dispense Refill  . acetaminophen (TYLENOL) 500 MG tablet Take 1,000 mg by mouth every 8 (eight) hours as needed (pain).    Marland Kitchen amLODipine (NORVASC) 10 MG tablet Take 1 tablet (10 mg total) by mouth daily. (Patient taking differently: Take 10 mg by mouth daily before breakfast. ) 90 tablet 1  . aspirin 325 MG EC tablet Take 325 mg by mouth daily.      . calcium carbonate (OSCAL) 1500 (600 Ca) MG TABS tablet Take by mouth 2 (two) times daily with a meal.    . Cholecalciferol (VITAMIN D3) 1000 units CAPS Take 2 capsules (2,000 Units total) by mouth daily. 60 capsule 0  . clobetasol (TEMOVATE) 0.05 % external solution Apply 1 application topically daily as needed (scalp psoriasis).   0  . diclofenac sodium (VOLTAREN) 1 % GEL Apply 4 g topically 4 (four) times daily as needed. (Patient taking differently: Apply 1 application topically daily as needed (shoulder pain). ) 400 g 11  . levocetirizine (XYZAL) 5 MG tablet     . levothyroxine (SYNTHROID, LEVOTHROID) 100 MCG tablet Take 1 tablet (100 mcg total) by mouth daily before breakfast. 90 tablet 1  . lisinopril (PRINIVIL,ZESTRIL) 20 MG tablet Take 1 tablet (20 mg total) by mouth daily. 90 tablet 3  . lovastatin (MEVACOR) 40 MG tablet Take 1 tablet (40 mg total) by mouth at bedtime. 90 tablet 3  . nystatin (NYSTATIN) powder Apply topically 4 (four) times daily. 30 g 1  . omeprazole (PRILOSEC) 20 MG capsule Take 1 capsule (20 mg total) by mouth daily. (Patient taking differently:  Take 20 mg by mouth at bedtime. ) 90 capsule 1  . SUMAtriptan (IMITREX) 100 MG tablet Take 1 tablet (100 mg total) by mouth See admin instructions. Take 1 tablet (100 mg) by mouth every 2 hours as needed for migraine or headache, may repeat in 2 hours if headache persists or recurs 10 tablet 2  . triamcinolone cream (KENALOG) 0.1 % Apply 1 application topically 2 (two) times daily. (Patient taking differently: Apply 1 application topically daily as needed (psoriasis in ears). ) 30 g 0  . Turmeric 500 MG  TABS Take 500 mg by mouth daily.     No current facility-administered medications for this visit.     Review of Systems : See HPI for pertinent positives and negatives.  Physical Examination  Vitals:   02/07/18 0842 02/07/18 0850  BP: 118/68 118/74  Pulse: 66   Resp: 14   Temp: (!) 96.9 F (36.1 C)   SpO2: 97%   Weight: 195 lb (88.5 kg)   Height: 5\' 2"  (1.575 m)    Body mass index is 35.67 kg/m.  General: WDWN obese female in NAD GAIT: normal Eyes: PERRLA HENT: No gross abnormalities.  Pulmonary:  Respirations are non-labored, good air movement in all fields, CTAB, no rales, rhonchi, or wheezes Cardiac:  Regular rhythm and rate, no detected murmur.  VASCULAR EXAM Carotid Bruits Right Left   Negative Negative     Abdominal aortic pulse is not palpable. Radial pulses are 2+ palpable and equal.                                                                                                                            LE Pulses Right Left       FEMORAL  2+ palpable  2+ palpable        POPLITEAL  1+ palpable   1+ palpable       POSTERIOR TIBIAL  not palpable   not palpable        DORSALIS PEDIS      ANTERIOR TIBIAL 2+ palpable  2+ palpable     Gastrointestinal: soft, nontender, BS WNL, no r/g, no palpable masses. Musculoskeletal: no muscle atrophy/wasting. M/S 4/5 throughout, extremities without ischemic changes. Right leg raise against resistance elicits pain in right  low back, right hip, and right thigh.  Skin: No rashes, no ulcers, no cellulitis.   Neurologic:  A&O X 3; appropriate affect, sensation is normal; speech is normal, CN 2-12 intact, pain and light touch intact in extremities, motor exam as listed above. Psychiatric: Normal thought content, mood appropriate to clinical situation.    Assessment: Ladawna Walgren Eads is a 62 y.o. female who is s/p left carotid endarterectomy in 2016 by Dr Oneida Alar.    She has 2 TIA's and 1 stroke in 2006, had E. Coli around the same time which damaged her left kidney, which she states is no longer functioning. She is followed by nephrology.  She has not had any neurological events since 2006.   Palpable bilateral femoral, popliteal, and pedal pulses; her right hip and low back pain are not due to lack of arterial perfusion.    DATA  Carotid Duplex (02-07-18): Right Carotid: Velocities in the right ICA are consistent with a 1-39% stenosis. Left Carotid: Non-hemodynamically significant plaque noted in the CCA. Patent               left carotid endarterectomy site with no evidence of restenosis. Vertebrals:  Bilateral vertebral arteries demonstrate antegrade flow. Subclavians: Bilateral subclavian artery flow  was disturbed.  No significant change compared to the exam on 01-31-17.   Plan: Follow-up in 1 year with Carotid Duplex scan.  I discussed in depth with the patient the nature of atherosclerosis, and emphasized the importance of maximal medical management including strict control of blood pressure, blood glucose, and lipid levels, obtaining regular exercise, and continued cessation of smoking.  The patient is aware that without maximal medical management the underlying atherosclerotic disease process will progress, limiting the benefit of any interventions. The patient was given information about stroke prevention and what symptoms should prompt the patient to seek immediate medical care. Thank you for allowing  [place] to participate in this patient's care.  Clemon Chambers, RN, MSN, FNP-C [facility] Office: [phone]  Clinic Physician: Donzetta Matters  02/07/18 8:58 AM

## 2018-02-07 NOTE — Patient Instructions (Signed)

## 2018-03-10 DIAGNOSIS — Z0120 Encounter for dental examination and cleaning without abnormal findings: Secondary | ICD-10-CM | POA: Diagnosis not present

## 2018-03-17 ENCOUNTER — Other Ambulatory Visit: Payer: Self-pay | Admitting: Internal Medicine

## 2018-04-07 ENCOUNTER — Encounter: Payer: Self-pay | Admitting: Internal Medicine

## 2018-04-11 ENCOUNTER — Other Ambulatory Visit: Payer: Self-pay | Admitting: Internal Medicine

## 2018-04-30 ENCOUNTER — Other Ambulatory Visit: Payer: Self-pay

## 2018-04-30 ENCOUNTER — Encounter: Payer: Self-pay | Admitting: Internal Medicine

## 2018-04-30 ENCOUNTER — Ambulatory Visit (INDEPENDENT_AMBULATORY_CARE_PROVIDER_SITE_OTHER): Payer: Medicare Other | Admitting: Internal Medicine

## 2018-04-30 ENCOUNTER — Other Ambulatory Visit (INDEPENDENT_AMBULATORY_CARE_PROVIDER_SITE_OTHER): Payer: Medicare Other

## 2018-04-30 VITALS — BP 136/84 | HR 74 | Temp 98.4°F | Ht 62.0 in | Wt 196.0 lb

## 2018-04-30 DIAGNOSIS — N183 Chronic kidney disease, stage 3 unspecified: Secondary | ICD-10-CM

## 2018-04-30 DIAGNOSIS — M65332 Trigger finger, left middle finger: Secondary | ICD-10-CM

## 2018-04-30 DIAGNOSIS — Z0001 Encounter for general adult medical examination with abnormal findings: Secondary | ICD-10-CM

## 2018-04-30 DIAGNOSIS — G4700 Insomnia, unspecified: Secondary | ICD-10-CM

## 2018-04-30 DIAGNOSIS — M6530 Trigger finger, unspecified finger: Secondary | ICD-10-CM | POA: Insufficient documentation

## 2018-04-30 DIAGNOSIS — E119 Type 2 diabetes mellitus without complications: Secondary | ICD-10-CM | POA: Diagnosis not present

## 2018-04-30 DIAGNOSIS — J309 Allergic rhinitis, unspecified: Secondary | ICD-10-CM

## 2018-04-30 DIAGNOSIS — R011 Cardiac murmur, unspecified: Secondary | ICD-10-CM

## 2018-04-30 LAB — CBC WITH DIFFERENTIAL/PLATELET
Basophils Absolute: 0.1 10*3/uL (ref 0.0–0.1)
Basophils Relative: 0.7 % (ref 0.0–3.0)
Eosinophils Absolute: 0.6 10*3/uL (ref 0.0–0.7)
Eosinophils Relative: 6.9 % — ABNORMAL HIGH (ref 0.0–5.0)
HCT: 39.9 % (ref 36.0–46.0)
Hemoglobin: 13.4 g/dL (ref 12.0–15.0)
Lymphocytes Relative: 23.3 % (ref 12.0–46.0)
Lymphs Abs: 1.9 10*3/uL (ref 0.7–4.0)
MCHC: 33.5 g/dL (ref 30.0–36.0)
MCV: 93.2 fl (ref 78.0–100.0)
Monocytes Absolute: 0.5 10*3/uL (ref 0.1–1.0)
Monocytes Relative: 6.4 % (ref 3.0–12.0)
Neutro Abs: 5.1 10*3/uL (ref 1.4–7.7)
Neutrophils Relative %: 62.7 % (ref 43.0–77.0)
Platelets: 212 10*3/uL (ref 150.0–400.0)
RBC: 4.29 Mil/uL (ref 3.87–5.11)
RDW: 13.7 % (ref 11.5–15.5)
WBC: 8.1 10*3/uL (ref 4.0–10.5)

## 2018-04-30 LAB — URINALYSIS, ROUTINE W REFLEX MICROSCOPIC
Bilirubin Urine: NEGATIVE
Hgb urine dipstick: NEGATIVE
Ketones, ur: NEGATIVE
Leukocytes,Ua: NEGATIVE
Nitrite: NEGATIVE
RBC / HPF: NONE SEEN (ref 0–?)
Specific Gravity, Urine: 1.02 (ref 1.000–1.030)
Total Protein, Urine: NEGATIVE
Urine Glucose: NEGATIVE
Urobilinogen, UA: 0.2 (ref 0.0–1.0)
pH: 6 (ref 5.0–8.0)

## 2018-04-30 LAB — BASIC METABOLIC PANEL
BUN: 31 mg/dL — ABNORMAL HIGH (ref 6–23)
CO2: 24 mEq/L (ref 19–32)
Calcium: 9.2 mg/dL (ref 8.4–10.5)
Chloride: 106 mEq/L (ref 96–112)
Creatinine, Ser: 1.62 mg/dL — ABNORMAL HIGH (ref 0.40–1.20)
GFR: 32.26 mL/min — ABNORMAL LOW (ref >60.00)
Glucose, Bld: 131 mg/dL — ABNORMAL HIGH (ref 70–99)
Potassium: 4.5 mEq/L (ref 3.5–5.1)
Sodium: 140 mEq/L (ref 135–145)

## 2018-04-30 LAB — LIPID PANEL
Cholesterol: 202 mg/dL — ABNORMAL HIGH (ref 0–200)
HDL: 45.1 mg/dL (ref >39.00)
LDL Cholesterol: 119 mg/dL — ABNORMAL HIGH (ref 0–99)
NonHDL: 156.52
Total CHOL/HDL Ratio: 4
Triglycerides: 187 mg/dL — ABNORMAL HIGH (ref 0.0–149.0)
VLDL: 37.4 mg/dL (ref 0.0–40.0)

## 2018-04-30 LAB — MICROALBUMIN / CREATININE URINE RATIO
Creatinine,U: 115.5 mg/dL
Microalb Creat Ratio: 3.4 mg/g (ref 0.0–30.0)
Microalb, Ur: 3.9 mg/dL — ABNORMAL HIGH (ref 0.0–1.9)

## 2018-04-30 LAB — HEPATIC FUNCTION PANEL
ALT: 24 U/L (ref 0–35)
AST: 16 U/L (ref 0–37)
Albumin: 4.3 g/dL (ref 3.5–5.2)
Alkaline Phosphatase: 66 U/L (ref 39–117)
Bilirubin, Direct: 0 mg/dL (ref 0.0–0.3)
Total Bilirubin: 0.5 mg/dL (ref 0.2–1.2)
Total Protein: 6.7 g/dL (ref 6.0–8.3)

## 2018-04-30 LAB — TSH: TSH: 0.43 u[IU]/mL (ref 0.35–4.50)

## 2018-04-30 MED ORDER — AMLODIPINE BESYLATE 10 MG PO TABS: 10.0000 mg | ORAL_TABLET | Freq: Every day | ORAL | 3 refills | Status: DC

## 2018-04-30 MED ORDER — LISINOPRIL 20 MG PO TABS: 20.0000 mg | ORAL_TABLET | Freq: Every day | ORAL | 3 refills | Status: DC

## 2018-04-30 MED ORDER — OMEPRAZOLE 20 MG PO CPDR: 20.0000 mg | DELAYED_RELEASE_CAPSULE | Freq: Every day | ORAL | 3 refills | Status: DC

## 2018-04-30 MED ORDER — TRIAMCINOLONE ACETONIDE 0.1 % EX CREA: 1.0000 "application " | TOPICAL_CREAM | Freq: Two times a day (BID) | CUTANEOUS | 0 refills | Status: DC

## 2018-04-30 MED ORDER — LEVOTHYROXINE SODIUM 100 MCG PO TABS: ORAL_TABLET | ORAL | 3 refills | Status: DC

## 2018-04-30 MED ORDER — LOVASTATIN 40 MG PO TABS: 40.0000 mg | ORAL_TABLET | Freq: Every day | ORAL | 3 refills | Status: DC

## 2018-04-30 NOTE — Assessment & Plan Note (Signed)
Not noted previously, for echo, r/o AS

## 2018-04-30 NOTE — Assessment & Plan Note (Signed)
Ok for tylenol PM in order to try to avoid polypharmacy

## 2018-04-30 NOTE — Assessment & Plan Note (Signed)
stable overall by history and exam, recent data reviewed with pt, and pt to continue medical treatment as before,  to f/u any worsening symptoms or concerns  

## 2018-04-30 NOTE — Assessment & Plan Note (Addendum)
Gradually worsening x several months, for referral ortho hand surgury dr Amedeo Plenty per pt request  In addition to the time spent performing CPE, I spent an additional 25 minutes face to face,in which greater than 50% of this time was spent in counseling and coordination of care for patient's acute illness as documented, including the differential dx, treatment, further evaluation and other management of trigger finger, Dm, CKD, insomnia, allergic rhinitis and heart mumur

## 2018-04-30 NOTE — Progress Notes (Signed)
Subjective:    Patient ID: Heather Dawson, female    DOB: Jul 14, 1955, 63 y.o.   MRN: [id number]  HPI  Here for wellness and f/u;  Overall doing ok;  Pt denies Chest pain, worsening SOB, DOE, wheezing, orthopnea, PND, worsening LE edema, palpitations, dizziness or syncope.  Pt denies neurological change such as new headache, facial or extremity weakness.  Pt denies polydipsia, polyuria, or low sugar symptoms. Pt states overall good compliance with treatment and medications, good tolerability, and has been trying to follow appropriate diet.  Pt denies worsening depressive symptoms, suicidal ideation or panic. No fever, night sweats, wt loss, loss of appetite, or other constitutional symptoms.  Pt states good ability with ADL's, has low fall risk, home safety reviewed and adequate, no other significant changes in hearing or vision, and only occasionally active with exercise.  S/p C diff tx x 2 rounds of vancomycin, for routine colonoscopy coming up soon. Plans to f/u with optho yearly soon Dr Einar Gip. Does have several wks ongoing nasal allergy symptoms with clearish congestion, itch and sneezing, without fever, pain, ST, cough, swelling or wheezing, has not been using the nasacort lately.  Has also c/o 2 mo worsening third finger left hand trigger finger with difficulty flexing and extending, less grip, hurst to write, nothing makes better, and asks for referral to Dr Elspeth Cho.  Pt continues to have recurring LBP without change in severity, bowel or bladder change, fever, wt loss,  worsening LE pain/numbness/weakness, gait change or falls, can only walk to car in the lot here before pain starts, burning to the right buttocks mostly.  Has long hx of Si joint disease, insurance would not pay for treatment per Dr Rolena Infante.  Past Medical History:  Diagnosis Date  . Abdominal pain, epigastric 12/20/2006  . ALLERGIC RHINITIS 12/25/2006  . Allergy   . ANXIETY 10/16/2006   no per pt  . Arthritis    right  shoulder  . BACK PAIN 06/15/2008  . Blood transfusion without reported diagnosis   . BRADYCARDIA 05/04/2008  . C. difficile diarrhea   . Carpal tunnel syndrome 10/16/2006  . Cellulitis and abscess of other specified site 06/15/2008  . CHEST PAIN 09/08/2009  . Chronic low back pain   . CIRRHOSIS 10/16/2006  . CKD (chronic kidney disease)    2008- had Ecoli and caused renal failure, no problems since then  . CKD (chronic kidney disease) stage 3, GFR 30-59 ml/min (HCC) 10/16/2006   Qualifier: Diagnosis of  By: Jenny Reichmann MD, [place] 12/20/2006  . Complication of anesthesia    awake during 2 surgeries  . CONTUSION, LOWER LEG 09/15/2008  . DEPRESSION 10/16/2006   no per pt  . Esophageal reflux 12/25/2006  . HYPERLIPIDEMIA 12/25/2006  . Hyperlipidemia   . HYPERTENSION 08/15/2006  . Hypertension   . HYPOTHYROIDISM 08/15/2006  . Kidney failure, acute ([place]) 2009   as a result of severe E Coli infection  . Left-sided carotid artery disease ([place])   . MIGRAINE HEADACHE 10/16/2006  . Obesity 10/16/2006  . OVARIAN CYST 12/20/2006  . Psoriasis 01/03/2011  . SHINGLES, HX OF 12/20/2006  . Stroke [facility])    3 strokes 2008-no deficits, only on ASA  . TRANSIENT ISCHEMIC ATTACKS, HX OF 12/20/2006  . VERTIGO 09/15/2007  . VITAMIN B12 DEFICIENCY 01/09/2007   Past Surgical History:  Procedure Laterality Date  . ABDOMINAL HYSTERECTOMY  02/2006  . ANTERIOR CERVICAL DECOMP/DISCECTOMY FUSION  2005  . CESAREAN SECTION    .  CHOLECYSTECTOMY    . COLONOSCOPY    . ENDARTERECTOMY Left 08/25/2014   Procedure: LEFT CAROTID ENDARTERECTOMY WITH HEMASHIELD PATCH ANGIOPLASTY;  Surgeon: Elam Dutch, MD;  Location: [place];  Service: Vascular;  Laterality: Left;  . NECK SURGERY  2005  . ROTATOR CUFF REPAIR Right 02/2014   Dr Veverly Fells    reports that she has quit smoking. She has never used smokeless tobacco. She reports that she does not drink alcohol or use drugs. family history includes Diabetes in her  father, sister, and another family member; Heart disease in her brother, brother, father, and mother; Hypertension in her brother, brother and another family member; Lupus in her mother; Raynaud syndrome in her mother; Stroke in her father. Allergies  Allergen Reactions  . Cefuroxime Axetil Swelling     edema/swelling  . Ciprofloxacin Swelling  . Levaquin [Levofloxacin In D5w] Other (See Comments)    GI upset  . Lipitor [Atorvastatin] Other (See Comments)    Myalgia   . Lyrica [Pregabalin] Other (See Comments)    HA, confusion, swelling  . Macrodantin [Nitrofurantoin Macrocrystal] Other (See Comments)    GI upset, diarrhea and sob  . Gabapentin Rash   Review of Systems Constitutional: Negative for other unusual diaphoresis, sweats, appetite or weight changes HENT: Negative for other worsening hearing loss, ear pain, facial swelling, mouth sores or neck stiffness.   Eyes: Negative for other worsening pain, redness or other visual disturbance.  Respiratory: Negative for other stridor or swelling Cardiovascular: Negative for other palpitations or other chest pain  Gastrointestinal: Negative for worsening diarrhea or loose stools, blood in stool, distention or other pain Genitourinary: Negative for hematuria, flank pain or other change in urine volume.  Musculoskeletal: Negative for myalgias or other joint swelling.  Skin: Negative for other color change, or other wound or worsening drainage.  Neurological: Negative for other syncope or numbness. Hematological: Negative for other adenopathy or swelling Psychiatric/Behavioral: Negative for hallucinations, other worsening agitation, SI, self-injury, or new decreased concentration All other system neg per pt    Objective:   Physical Exam BP 136/84   Pulse 74   Temp 98.4 F (36.9 C) (Oral)   Ht 5\' 2"  (1.575 m)   Wt 196 lb (88.9 kg)   SpO2 95%   BMI 35.85 kg/m  VS noted,  Constitutional: Pt is oriented to person, place, and time.  Appears well-developed and well-nourished, in no significant distress and comfortable Head: Normocephalic and atraumatic  Eyes: Conjunctivae and EOM are normal. Pupils are equal, round, and reactive to light Bilat tm's with mild erythema.  Max sinus areas non tender.  Pharynx with mild erythema, no exudate Right Ear: External ear normal without discharge Left Ear: External ear normal without discharge Nose: Nose without discharge or deformity Mouth/Throat: Oropharynx is without other ulcerations and moist  Neck: Normal range of motion. Neck supple. No JVD present. No tracheal deviation present or significant neck LA or mass Cardiovascular: Normal rate, regular rhythm, normal heart sounds and intact distal pulses.  except for gr 2/6 sys murmur RUSB/LUSB Pulmonary/Chest: WOB normal and breath sounds without rales or wheezing  Abdominal: Soft. Bowel sounds are normal. NT. No HSM  Musculoskeletal: Normal range of motion. Exhibits no edema, has trigger finger abnormality of middle finger left hand Lymphadenopathy: Has no other cervical adenopathy.  Neurological: Pt is alert and oriented to person, place, and time. Pt has normal reflexes. No cranial nerve deficit. Motor grossly intact, Gait intact Skin: Skin is warm and dry. No  rash noted or new ulcerations Psychiatric:  Has normal mood and affect. Behavior is normal without agitation No other exam findings Lab Results  Component Value Date   WBC 8.3 09/13/2017   HGB 12.9 09/13/2017   HCT 38.5 09/13/2017   PLT 214.0 09/13/2017   GLUCOSE 195 (H) 12/31/2017   CHOL 195 04/23/2017   TRIG 133.0 04/23/2017   HDL 51.80 04/23/2017   LDLDIRECT 94.0 11/13/2016   LDLCALC 116 (H) 04/23/2017   ALT 26 12/31/2017   AST 18 12/31/2017   NA 138 12/31/2017   K 4.3 12/31/2017   CL 104 12/31/2017   CREATININE 1.90 (H) 12/31/2017   BUN 27 (H) 12/31/2017   CO2 24 12/31/2017   TSH 0.42 12/31/2017   INR 0.99 08/17/2014   HGBA1C 6.7 (A) 10/24/2017   HGBA1C  0 10/24/2017   HGBA1C 0 (A) 10/24/2017   HGBA1C 0.0 10/24/2017   MICROALBUR 3.4 (H) 04/23/2017       Assessment & Plan:

## 2018-04-30 NOTE — Assessment & Plan Note (Signed)

## 2018-04-30 NOTE — Patient Instructions (Signed)
Ok to take the tylenol PM for sleep if this works for you  Please continue all other medications as before, and refills have been done if requested.  Please have the pharmacy call with any other refills you may need.  Please continue your efforts at being more active, low cholesterol diet, and weight control.  You are otherwise up to date with prevention measures today.  Please keep your appointments with your specialists as you may have planned  You will be contacted regarding the referral for: Echocardiogram, and Dr Benita Gutter are given the handicap parking application  Please go to the LAB in the Basement (turn left off the elevator) for the tests to be done today  You will be contacted by phone if any changes need to be made immediately.  Otherwise, you will receive a letter about your results with an explanation, but please check with MyChart first.  Please remember to sign up for MyChart if you have not done so, as this will be important to you in the future with finding out test results, communicating by private email, and scheduling acute appointments online when needed.  Please return in 6 months, or sooner if needed, with Lab testing done 3-5 days before

## 2018-04-30 NOTE — Assessment & Plan Note (Signed)
Uncontrolled on the xyzal, ok to add the OTC nasacort asd

## 2018-04-30 NOTE — Assessment & Plan Note (Signed)
Follows every 6 mo with renal/Dr [facility], has been stable recently, also for f/u lab today

## 2018-05-01 LAB — HEMOGLOBIN A1C
Hgb A1c MFr Bld: 6.9 % of total Hgb — ABNORMAL HIGH (ref <5.7)
Mean Plasma Glucose: 151 (calc)
eAG (mmol/L): 8.4 (calc)

## 2018-05-06 ENCOUNTER — Encounter: Payer: Self-pay | Admitting: Internal Medicine

## 2018-05-06 ENCOUNTER — Ambulatory Visit (HOSPITAL_COMMUNITY): Payer: Medicare Other | Attending: Cardiovascular Disease

## 2018-05-06 ENCOUNTER — Other Ambulatory Visit: Payer: Self-pay

## 2018-05-06 DIAGNOSIS — I350 Nonrheumatic aortic (valve) stenosis: Secondary | ICD-10-CM | POA: Insufficient documentation

## 2018-05-06 DIAGNOSIS — R011 Cardiac murmur, unspecified: Secondary | ICD-10-CM

## 2018-05-07 DIAGNOSIS — H5203 Hypermetropia, bilateral: Secondary | ICD-10-CM | POA: Diagnosis not present

## 2018-05-07 LAB — HM DIABETES EYE EXAM

## 2018-06-23 DIAGNOSIS — M79645 Pain in left finger(s): Secondary | ICD-10-CM | POA: Diagnosis not present

## 2018-06-23 DIAGNOSIS — M65332 Trigger finger, left middle finger: Secondary | ICD-10-CM | POA: Diagnosis not present

## 2018-07-23 DIAGNOSIS — M65332 Trigger finger, left middle finger: Secondary | ICD-10-CM | POA: Diagnosis not present

## 2018-07-23 DIAGNOSIS — M79645 Pain in left finger(s): Secondary | ICD-10-CM | POA: Diagnosis not present

## 2018-08-04 ENCOUNTER — Ambulatory Visit: Payer: Self-pay | Admitting: Internal Medicine

## 2018-08-04 ENCOUNTER — Ambulatory Visit (INDEPENDENT_AMBULATORY_CARE_PROVIDER_SITE_OTHER): Payer: Medicare Other | Admitting: Internal Medicine

## 2018-08-04 DIAGNOSIS — R1084 Generalized abdominal pain: Secondary | ICD-10-CM

## 2018-08-04 DIAGNOSIS — R55 Syncope and collapse: Secondary | ICD-10-CM | POA: Diagnosis not present

## 2018-08-04 DIAGNOSIS — E119 Type 2 diabetes mellitus without complications: Secondary | ICD-10-CM | POA: Diagnosis not present

## 2018-08-04 DIAGNOSIS — N183 Chronic kidney disease, stage 3 unspecified: Secondary | ICD-10-CM

## 2018-08-04 DIAGNOSIS — R109 Unspecified abdominal pain: Secondary | ICD-10-CM

## 2018-08-04 NOTE — Telephone Encounter (Signed)
Pt. Reports she was dizzy Friday and fainted. Had some dizziness as well Sunday. Has had abdominal pain on and off. Reports she has had carotid blockage in the past "and this is the way I felt with that." Reports BP 130/83. Denies any chest pain. Warm transfer to Sam in the practice. Answer Assessment - Initial Assessment Questions 1. DESCRIPTION: "Describe your dizziness."     Dizzy 2. LIGHTHEADED: "Do you feel lightheaded?" (e.g., somewhat faint, woozy, weak upon standing)     Lightheaded 3. VERTIGO: "Do you feel like either you or the room is spinning or tilting?" (i.e. vertigo)     No 4. SEVERITY: "How bad is it?"  "Do you feel like you are going to faint?" "Can you stand and walk?"   - MILD - walking normally   - MODERATE - interferes with normal activities (e.g., work, school)    - SEVERE - unable to stand, requires support to walk, feels like passing out now.      Fainted Friday 5. ONSET:  "When did the dizziness begin?"     Friday 6. AGGRAVATING FACTORS: "Does anything make it worse?" (e.g., standing, change in head position)     No 7. HEART RATE: "Can you tell me your heart rate?" "How many beats in 15 seconds?"  (Note: not all patients can do this)       No problems 8. CAUSE: "What do you think is causing the dizziness?"     Maybe a blockage 9. RECURRENT SYMPTOM: "Have you had dizziness before?" If so, ask: "When was the last time?" "What happened that time?"     Yes - I had a blocked carotid 10. OTHER SYMPTOMS: "Do you have any other symptoms?" (e.g., fever, chest pain, vomiting, diarrhea, bleeding)       Abdominal pain 11. PREGNANCY: "Is there any chance you are pregnant?" "When was your last menstrual period?"       No  Protocols used: DIZZINESS [facility]

## 2018-08-04 NOTE — Patient Instructions (Signed)
Please continue all other medications as before, and refills have been done if requested.  Please have the pharmacy call with any other refills you may need.  Please continue your efforts at being more active, low cholesterol diet, and weight control.  You are otherwise up to date with prevention measures today.  Please keep your appointments with your specialists as you may have planned  You will be contacted regarding the referral for: MRI for head, and abdomen  Please go to the LAB in the Basement (turn left off the elevator) for the tests to be done tomorrow  You will be contacted by phone if any changes need to be made immediately.  Otherwise, you will receive a letter about your results with an explanation, but please check with MyChart first.  Please remember to sign up for MyChart if you have not done so, as this will be important to you in the future with finding out test results, communicating by private email, and scheduling acute appointments online when needed.

## 2018-08-04 NOTE — Progress Notes (Signed)
Patient ID: Heather Dawson, female   DOB: [birth year], 63 y.o.   MRN: [id number]  Virtual Visit via Video Note  I connected with Michae Kava Winthrop on 08/04/18 at  3:20 PM EDT by a video enabled telemedicine application and verified that I am speaking with the correct person using two identifiers.  Location: Patient: at home Provider: at office   I discussed the limitations of evaluation and management by telemedicine and the availability of in person appointments. The patient expressed understanding and agreed to proceed.  History of Present Illness: Here to report an unusual episode where she and husbnad were getting ready to do yard work, he made a joke and she laughed but then had significnat lightheaded dizzy and passed out while standing, for a few seconds, caught by the husband so no injury with falling.  Has had GI upset with abd pains, mid abd with radiation to the back, moderate, constant for periods of time all day for 3 days last wk, no N/V, fever, no constipation, or diarrhea, no blood.  Better to lie down for some reason.  Food made it worse.  Did have some pain again mid day yesterday, assoc with bloating and dizziness and located epigsatric .  S/p CCX. Denies reducing fluid intake overall.  Pt denies chest pain, increased sob or doe, wheezing, orthopnea, PND, increased LE swelling, palpitations,.  Pt denies new neurological symptoms such as new headache, or facial or extremity weakness or numbness except for the above.  Pt denies polydipsia, polyuria.  She is most concerned about possible stroke, and recalls having somewhat same symptoms of dizziness prior the left carotid surgury. Has not gone to ED due to coronavirus risk.   Pt denies fever, wt loss, night sweats, loss of appetite, or other constitutional symptoms Past Medical History:  Diagnosis Date  . Abdominal pain, epigastric 12/20/2006  . ALLERGIC RHINITIS 12/25/2006  . Allergy   . ANXIETY 10/16/2006   no per pt  . Arthritis    right  shoulder  . BACK PAIN 06/15/2008  . Blood transfusion without reported diagnosis   . BRADYCARDIA 05/04/2008  . C. difficile diarrhea   . Carpal tunnel syndrome 10/16/2006  . Cellulitis and abscess of other specified site 06/15/2008  . CHEST PAIN 09/08/2009  . Chronic low back pain   . CIRRHOSIS 10/16/2006  . CKD (chronic kidney disease)    2008- had Ecoli and caused renal failure, no problems since then  . CKD (chronic kidney disease) stage 3, GFR 30-59 ml/min (HCC) 10/16/2006   Qualifier: Diagnosis of  By: Jenny Reichmann MD, [place] 12/20/2006  . Complication of anesthesia    awake during 2 surgeries  . CONTUSION, LOWER LEG 09/15/2008  . DEPRESSION 10/16/2006   no per pt  . Esophageal reflux 12/25/2006  . HYPERLIPIDEMIA 12/25/2006  . Hyperlipidemia   . HYPERTENSION 08/15/2006  . Hypertension   . HYPOTHYROIDISM 08/15/2006  . Kidney failure, acute ([place]) 2009   as a result of severe E Coli infection  . Left-sided carotid artery disease ([place])   . MIGRAINE HEADACHE 10/16/2006  . Obesity 10/16/2006  . OVARIAN CYST 12/20/2006  . Psoriasis 01/03/2011  . SHINGLES, HX OF 12/20/2006  . Stroke [facility])    3 strokes 2008-no deficits, only on ASA  . TRANSIENT ISCHEMIC ATTACKS, HX OF 12/20/2006  . VERTIGO 09/15/2007  . VITAMIN B12 DEFICIENCY 01/09/2007   Past Surgical History:  Procedure Laterality Date  . ABDOMINAL HYSTERECTOMY  02/2006  .  ANTERIOR CERVICAL DECOMP/DISCECTOMY FUSION  2005  . CESAREAN SECTION    . CHOLECYSTECTOMY    . COLONOSCOPY    . ENDARTERECTOMY Left 08/25/2014   Procedure: LEFT CAROTID ENDARTERECTOMY WITH HEMASHIELD PATCH ANGIOPLASTY;  Surgeon: Elam Dutch, MD;  Location: [place];  Service: Vascular;  Laterality: Left;  . NECK SURGERY  2005  . ROTATOR CUFF REPAIR Right 02/2014   Dr Veverly Fells    reports that she has quit smoking. She has never used smokeless tobacco. She reports that she does not drink alcohol or use drugs. family history includes Diabetes in her  father, sister, and another family member; Heart disease in her brother, brother, father, and mother; Hypertension in her brother, brother and another family member; Lupus in her mother; Raynaud syndrome in her mother; Stroke in her father. Allergies  Allergen Reactions  . Cefuroxime Axetil Swelling     edema/swelling  . Ciprofloxacin Swelling  . Levaquin [Levofloxacin In D5w] Other (See Comments)    GI upset  . Lipitor [Atorvastatin] Other (See Comments)    Myalgia   . Lyrica [Pregabalin] Other (See Comments)    HA, confusion, swelling  . Macrodantin [Nitrofurantoin Macrocrystal] Other (See Comments)    GI upset, diarrhea and sob  . Gabapentin Rash   Current Outpatient Medications on File Prior to Visit  Medication Sig Dispense Refill  . acetaminophen (TYLENOL) 500 MG tablet Take 1,000 mg by mouth every 8 (eight) hours as needed (pain).    Marland Kitchen amLODipine (NORVASC) 10 MG tablet Take 1 tablet (10 mg total) by mouth daily. 90 tablet 3  . aspirin 325 MG EC tablet Take 325 mg by mouth daily.      . calcium carbonate (OSCAL) 1500 (600 Ca) MG TABS tablet Take by mouth 2 (two) times daily with a meal.    . Cholecalciferol (VITAMIN D3) 1000 units CAPS Take 2 capsules (2,000 Units total) by mouth daily. 60 capsule 0  . clobetasol (TEMOVATE) 0.05 % external solution Apply 1 application topically daily as needed (scalp psoriasis).   0  . diclofenac sodium (VOLTAREN) 1 % GEL Apply 4 g topically 4 (four) times daily as needed. (Patient taking differently: Apply 1 application topically daily as needed (shoulder pain). ) 400 g 11  . levocetirizine (XYZAL) 5 MG tablet     . levothyroxine (SYNTHROID, LEVOTHROID) 100 MCG tablet TAKE 1 TABLET BY MOUTH DAILY BEFORE BREAKFAST 90 tablet 3  . lisinopril (PRINIVIL,ZESTRIL) 20 MG tablet Take 1 tablet (20 mg total) by mouth daily. 90 tablet 3  . lovastatin (MEVACOR) 40 MG tablet Take 1 tablet (40 mg total) by mouth at bedtime. 90 tablet 3  . nystatin (NYSTATIN)  powder Apply topically 4 (four) times daily. 30 g 1  . omeprazole (PRILOSEC) 20 MG capsule Take 1 capsule (20 mg total) by mouth daily. 90 capsule 3  . SUMAtriptan (IMITREX) 100 MG tablet Take 1 tablet (100 mg total) by mouth See admin instructions. Take 1 tablet (100 mg) by mouth every 2 hours as needed for migraine or headache, may repeat in 2 hours if headache persists or recurs 10 tablet 2  . triamcinolone cream (KENALOG) 0.1 % Apply 1 application topically 2 (two) times daily. 30 g 0  . Turmeric 500 MG TABS Take 500 mg by mouth daily.     No current facility-administered medications on file prior to visit.    Observations/Objective: Alert, NAD, appropriate mood and affect, resps normal, cn 2-12 intact, moves all 4s, no visible rash or  swelling Lab Results  Component Value Date   WBC 8.1 04/30/2018   HGB 13.4 04/30/2018   HCT 39.9 04/30/2018   PLT 212.0 04/30/2018   GLUCOSE 131 (H) 04/30/2018   CHOL 202 (H) 04/30/2018   TRIG 187.0 (H) 04/30/2018   HDL 45.10 04/30/2018   LDLDIRECT 94.0 11/13/2016   LDLCALC 119 (H) 04/30/2018   ALT 24 04/30/2018   AST 16 04/30/2018   NA 140 04/30/2018   K 4.5 04/30/2018   CL 106 04/30/2018   CREATININE 1.62 (H) 04/30/2018   BUN 31 (H) 04/30/2018   CO2 24 04/30/2018   TSH 0.43 04/30/2018   INR 0.99 08/17/2014   HGBA1C 6.9 (H) 04/30/2018   MICROALBUR 3.9 (H) 04/30/2018   Assessment and Plan: See notes  Follow Up Instructions: See notes   I discussed the assessment and treatment plan with the patient. The patient was provided an opportunity to ask questions and all were answered. The patient agreed with the plan and demonstrated an understanding of the instructions.   The patient was advised to call back or seek an in-person evaluation if the symptoms worsen or if the condition fails to improve as anticipated   [name], MD

## 2018-08-04 NOTE — Telephone Encounter (Signed)
Appointment has been made

## 2018-08-05 ENCOUNTER — Encounter: Payer: Self-pay | Admitting: Internal Medicine

## 2018-08-05 ENCOUNTER — Other Ambulatory Visit (INDEPENDENT_AMBULATORY_CARE_PROVIDER_SITE_OTHER): Payer: Medicare Other

## 2018-08-05 ENCOUNTER — Other Ambulatory Visit: Payer: Self-pay | Admitting: Internal Medicine

## 2018-08-05 DIAGNOSIS — N183 Chronic kidney disease, stage 3 unspecified: Secondary | ICD-10-CM

## 2018-08-05 DIAGNOSIS — R55 Syncope and collapse: Secondary | ICD-10-CM

## 2018-08-05 DIAGNOSIS — R109 Unspecified abdominal pain: Secondary | ICD-10-CM | POA: Diagnosis not present

## 2018-08-05 DIAGNOSIS — E119 Type 2 diabetes mellitus without complications: Secondary | ICD-10-CM

## 2018-08-05 LAB — CBC WITH DIFFERENTIAL/PLATELET
Basophils Absolute: 0 10*3/uL (ref 0.0–0.1)
Basophils Relative: 0.6 % (ref 0.0–3.0)
Eosinophils Absolute: 0.2 10*3/uL (ref 0.0–0.7)
Eosinophils Relative: 3 % (ref 0.0–5.0)
HCT: 41.2 % (ref 36.0–46.0)
Hemoglobin: 13.8 g/dL (ref 12.0–15.0)
Lymphocytes Relative: 25.7 % (ref 12.0–46.0)
Lymphs Abs: 1.9 10*3/uL (ref 0.7–4.0)
MCHC: 33.4 g/dL (ref 30.0–36.0)
MCV: 95.2 fl (ref 78.0–100.0)
Monocytes Absolute: 0.5 10*3/uL (ref 0.1–1.0)
Monocytes Relative: 7.1 % (ref 3.0–12.0)
Neutro Abs: 4.8 10*3/uL (ref 1.4–7.7)
Neutrophils Relative %: 63.6 % (ref 43.0–77.0)
Platelets: 225 10*3/uL (ref 150.0–400.0)
RBC: 4.33 Mil/uL (ref 3.87–5.11)
RDW: 12.7 % (ref 11.5–15.5)
WBC: 7.5 10*3/uL (ref 4.0–10.5)

## 2018-08-05 LAB — BASIC METABOLIC PANEL
BUN: 40 mg/dL — ABNORMAL HIGH (ref 6–23)
CO2: 24 mEq/L (ref 19–32)
Calcium: 9.3 mg/dL (ref 8.4–10.5)
Chloride: 106 mEq/L (ref 96–112)
Creatinine, Ser: 1.83 mg/dL — ABNORMAL HIGH (ref 0.40–1.20)
GFR: 28 mL/min — ABNORMAL LOW (ref >60.00)
Glucose, Bld: 130 mg/dL — ABNORMAL HIGH (ref 70–99)
Potassium: 4.5 mEq/L (ref 3.5–5.1)
Sodium: 141 mEq/L (ref 135–145)

## 2018-08-05 LAB — TSH: TSH: 0.98 u[IU]/mL (ref 0.35–4.50)

## 2018-08-05 LAB — URINALYSIS, ROUTINE W REFLEX MICROSCOPIC
Bilirubin Urine: NEGATIVE
Ketones, ur: NEGATIVE
Nitrite: NEGATIVE
Specific Gravity, Urine: 1.02 (ref 1.000–1.030)
Total Protein, Urine: NEGATIVE
Urine Glucose: NEGATIVE
Urobilinogen, UA: 0.2 (ref 0.0–1.0)
pH: 5.5 (ref 5.0–8.0)

## 2018-08-05 LAB — HEPATIC FUNCTION PANEL
ALT: 17 U/L (ref 0–35)
AST: 13 U/L (ref 0–37)
Albumin: 4.2 g/dL (ref 3.5–5.2)
Alkaline Phosphatase: 71 U/L (ref 39–117)
Bilirubin, Direct: 0.1 mg/dL (ref 0.0–0.3)
Total Bilirubin: 0.6 mg/dL (ref 0.2–1.2)
Total Protein: 6.5 g/dL (ref 6.0–8.3)

## 2018-08-05 LAB — LIPID PANEL
Cholesterol: 199 mg/dL (ref 0–200)
HDL: 42.2 mg/dL (ref >39.00)
NonHDL: 156.7
Total CHOL/HDL Ratio: 5
Triglycerides: 209 mg/dL — ABNORMAL HIGH (ref 0.0–149.0)
VLDL: 41.8 mg/dL — ABNORMAL HIGH (ref 0.0–40.0)

## 2018-08-05 LAB — HEMOGLOBIN A1C: Hgb A1c MFr Bld: 7.9 % — ABNORMAL HIGH (ref 4.6–6.5)

## 2018-08-05 LAB — LIPASE: Lipase: 29 U/L (ref 11.0–59.0)

## 2018-08-05 LAB — VITAMIN D 25 HYDROXY (VIT D DEFICIENCY, FRACTURES): VITD: 44.45 ng/mL (ref 30.00–100.00)

## 2018-08-05 LAB — LDL CHOLESTEROL, DIRECT: Direct LDL: 140 mg/dL

## 2018-08-05 MED ORDER — LINAGLIPTIN 5 MG PO TABS: 5.0000 mg | ORAL_TABLET | Freq: Every day | ORAL | 3 refills | Status: DC

## 2018-08-05 NOTE — Assessment & Plan Note (Signed)
stable overall by history and exam, recent data reviewed with pt, and pt to continue medical treatment as before,  to f/u any worsening symptoms or concerns  

## 2018-08-05 NOTE — Assessment & Plan Note (Signed)
Etiology unclear, also for Head MRI,  to f/u any worsening symptoms or concerns

## 2018-08-05 NOTE — Assessment & Plan Note (Signed)
stable overall by history and exam, recent data reviewed with pt, and pt to continue medical treatment as before,  to f/u any worsening symptoms or concerns, for a1c with labs 

## 2018-08-05 NOTE — Assessment & Plan Note (Addendum)
Etiology unclear, unable for CT due to renal, for MRI abd, cont same med tx, check labs as ordered  Note:  Total time for pt hx, exam, review of record with pt in the room, determination of diagnoses and plan for further eval and tx is > 40 min, with over 50% spent in coordination and counseling of patient including the differential dx, tx, further evaluation and other management of abd pain in female, syncope, DM, and CKD

## 2018-08-06 ENCOUNTER — Telehealth: Payer: Self-pay

## 2018-08-06 NOTE — Telephone Encounter (Signed)
-----   Message from Biagio Borg, MD sent at 08/05/2018  5:21 PM EDT ----- Left message on MyChart, pt to cont same tx except  The test results show that your current treatment is OK, except the A1c is too high.  Given your slower kidneys, we can still add a medication called Tradjenta 5 mg per day.  I will send the prescription and you should hear from the office as well.Redmond Baseman to please inform pt, I will do rx

## 2018-08-06 NOTE — Telephone Encounter (Signed)
Pt has been informed of results and expressed understanding.  °

## 2018-08-07 DIAGNOSIS — I129 Hypertensive chronic kidney disease with stage 1 through stage 4 chronic kidney disease, or unspecified chronic kidney disease: Secondary | ICD-10-CM | POA: Diagnosis not present

## 2018-08-07 DIAGNOSIS — N179 Acute kidney failure, unspecified: Secondary | ICD-10-CM | POA: Diagnosis not present

## 2018-08-07 DIAGNOSIS — E559 Vitamin D deficiency, unspecified: Secondary | ICD-10-CM | POA: Diagnosis not present

## 2018-08-07 DIAGNOSIS — N183 Chronic kidney disease, stage 3 (moderate): Secondary | ICD-10-CM | POA: Diagnosis not present

## 2018-08-09 ENCOUNTER — Other Ambulatory Visit: Payer: Self-pay | Admitting: Internal Medicine

## 2018-08-12 ENCOUNTER — Telehealth: Payer: Self-pay

## 2018-08-12 ENCOUNTER — Telehealth: Payer: Self-pay | Admitting: Internal Medicine

## 2018-08-12 DIAGNOSIS — R1084 Generalized abdominal pain: Secondary | ICD-10-CM

## 2018-08-12 MED ORDER — SITAGLIPTIN PHOSPHATE 50 MG PO TABS: 50.0000 mg | ORAL_TABLET | Freq: Every day | ORAL | 3 refills | Status: DC

## 2018-08-12 NOTE — Telephone Encounter (Signed)
Copied from [place] [id number]. Topic: General - Other >> Aug 11, 2018  3:26 PM Rainey Pines A wrote: Juliann Pulse from [facility] stated that order for referral needs to be changed in Epic to mri abdomen with and without contrast.

## 2018-08-12 NOTE — Addendum Note (Signed)
Addended by: Biagio Borg on: 08/12/2018 08:11 AM   Modules accepted: Orders

## 2018-08-12 NOTE — Telephone Encounter (Signed)
Ok this is done 

## 2018-08-12 NOTE — Telephone Encounter (Signed)
Noted.   Heather Dawson was denied.   Copied from [place] [phone]. Topic: General - Other >> Aug 11, 2018 11:36 AM Celene Kras A wrote: Reason for CRM: Hassan Rowan, from [employer], called stating the PA had been denied for this pt. Please contact and advise.   Callback # [phone] option #5

## 2018-08-12 NOTE — Telephone Encounter (Signed)
Ok to try [place] renal dosed  - done erx to mail in pharmacy

## 2018-08-13 ENCOUNTER — Telehealth: Payer: Self-pay

## 2018-08-13 NOTE — Telephone Encounter (Signed)
Noted.  Will wait until she gives PCP alternative meds her insurance will cover.

## 2018-09-10 ENCOUNTER — Ambulatory Visit
Admission: RE | Admit: 2018-09-10 | Discharge: 2018-09-10 | Disposition: A | Discharge status: Home / Self Care | Payer: Medicare Other | Source: Ambulatory Visit | Attending: Internal Medicine | Admitting: Internal Medicine

## 2018-09-10 DIAGNOSIS — R1084 Generalized abdominal pain: Secondary | ICD-10-CM

## 2018-09-10 DIAGNOSIS — R55 Syncope and collapse: Secondary | ICD-10-CM

## 2018-09-10 DIAGNOSIS — K862 Cyst of pancreas: Secondary | ICD-10-CM | POA: Diagnosis not present

## 2018-09-10 DIAGNOSIS — R42 Dizziness and giddiness: Secondary | ICD-10-CM | POA: Diagnosis not present

## 2018-09-10 DIAGNOSIS — E119 Type 2 diabetes mellitus without complications: Secondary | ICD-10-CM

## 2018-09-10 DIAGNOSIS — R109 Unspecified abdominal pain: Secondary | ICD-10-CM

## 2018-09-10 DIAGNOSIS — N183 Chronic kidney disease, stage 3 unspecified: Secondary | ICD-10-CM

## 2018-10-15 ENCOUNTER — Other Ambulatory Visit: Payer: Self-pay | Admitting: Internal Medicine

## 2018-10-31 ENCOUNTER — Other Ambulatory Visit: Payer: Self-pay

## 2018-10-31 ENCOUNTER — Encounter: Payer: Self-pay | Admitting: Internal Medicine

## 2018-10-31 ENCOUNTER — Ambulatory Visit (INDEPENDENT_AMBULATORY_CARE_PROVIDER_SITE_OTHER): Payer: Medicare Other | Admitting: Internal Medicine

## 2018-10-31 ENCOUNTER — Other Ambulatory Visit (INDEPENDENT_AMBULATORY_CARE_PROVIDER_SITE_OTHER): Payer: Medicare Other

## 2018-10-31 VITALS — BP 118/72 | HR 58 | Temp 98.5°F | Ht 62.0 in | Wt 187.0 lb

## 2018-10-31 DIAGNOSIS — N183 Chronic kidney disease, stage 3 unspecified: Secondary | ICD-10-CM

## 2018-10-31 DIAGNOSIS — E119 Type 2 diabetes mellitus without complications: Secondary | ICD-10-CM | POA: Diagnosis not present

## 2018-10-31 DIAGNOSIS — M19041 Primary osteoarthritis, right hand: Secondary | ICD-10-CM

## 2018-10-31 DIAGNOSIS — Z23 Encounter for immunization: Secondary | ICD-10-CM | POA: Diagnosis not present

## 2018-10-31 DIAGNOSIS — D369 Benign neoplasm, unspecified site: Secondary | ICD-10-CM | POA: Diagnosis not present

## 2018-10-31 DIAGNOSIS — M19042 Primary osteoarthritis, left hand: Secondary | ICD-10-CM

## 2018-10-31 LAB — CBC WITH DIFFERENTIAL/PLATELET
Basophils Absolute: 0 10*3/uL (ref 0.0–0.1)
Basophils Relative: 0.6 % (ref 0.0–3.0)
Eosinophils Absolute: 0.3 10*3/uL (ref 0.0–0.7)
Eosinophils Relative: 3.9 % (ref 0.0–5.0)
HCT: 39.3 % (ref 36.0–46.0)
Hemoglobin: 13.1 g/dL (ref 12.0–15.0)
Lymphocytes Relative: 22.1 % (ref 12.0–46.0)
Lymphs Abs: 1.6 10*3/uL (ref 0.7–4.0)
MCHC: 33.4 g/dL (ref 30.0–36.0)
MCV: 95.1 fl (ref 78.0–100.0)
Monocytes Absolute: 0.5 10*3/uL (ref 0.1–1.0)
Monocytes Relative: 6.3 % (ref 3.0–12.0)
Neutro Abs: 4.9 10*3/uL (ref 1.4–7.7)
Neutrophils Relative %: 67.1 % (ref 43.0–77.0)
Platelets: 225 10*3/uL (ref 150.0–400.0)
RBC: 4.13 Mil/uL (ref 3.87–5.11)
RDW: 12.5 % (ref 11.5–15.5)
WBC: 7.3 10*3/uL (ref 4.0–10.5)

## 2018-10-31 LAB — LIPID PANEL
Cholesterol: 158 mg/dL (ref 0–200)
HDL: 35.8 mg/dL — ABNORMAL LOW (ref >39.00)
LDL Cholesterol: 100 mg/dL — ABNORMAL HIGH (ref 0–99)
NonHDL: 122.04
Total CHOL/HDL Ratio: 4
Triglycerides: 110 mg/dL (ref 0.0–149.0)
VLDL: 22 mg/dL (ref 0.0–40.0)

## 2018-10-31 LAB — HEPATIC FUNCTION PANEL
ALT: 14 U/L (ref 0–35)
AST: 14 U/L (ref 0–37)
Albumin: 4.4 g/dL (ref 3.5–5.2)
Alkaline Phosphatase: 64 U/L (ref 39–117)
Bilirubin, Direct: 0.1 mg/dL (ref 0.0–0.3)
Total Bilirubin: 0.4 mg/dL (ref 0.2–1.2)
Total Protein: 6.9 g/dL (ref 6.0–8.3)

## 2018-10-31 LAB — BASIC METABOLIC PANEL
BUN: 44 mg/dL — ABNORMAL HIGH (ref 6–23)
CO2: 24 mEq/L (ref 19–32)
Calcium: 9.4 mg/dL (ref 8.4–10.5)
Chloride: 108 mEq/L (ref 96–112)
Creatinine, Ser: 2.05 mg/dL — ABNORMAL HIGH (ref 0.40–1.20)
GFR: 24.54 mL/min — ABNORMAL LOW (ref >60.00)
Glucose, Bld: 101 mg/dL — ABNORMAL HIGH (ref 70–99)
Potassium: 4.6 mEq/L (ref 3.5–5.1)
Sodium: 140 mEq/L (ref 135–145)

## 2018-10-31 LAB — HEMOGLOBIN A1C: Hgb A1c MFr Bld: 6.2 % (ref 4.6–6.5)

## 2018-10-31 MED ORDER — DICLOFENAC SODIUM 1 % TD GEL: 4.0000 g | Freq: Four times a day (QID) | TRANSDERMAL | 11 refills | Status: DC | PRN

## 2018-10-31 NOTE — Patient Instructions (Addendum)
You had the flu shot today  You will be contacted regarding the referral for: colonoscopy with Dr Henrene Pastor  Please take all new medication as prescribed - the voltaren gel  Please continue all other medications as before, and refills have been done if requested.  Please have the pharmacy call with any other refills you may need.  Please continue your efforts at being more active, low cholesterol diet, and weight control.  Please keep your appointments with your specialists as you may have planned  Please go to the LAB in the Basement (turn left off the elevator) for the tests to be done today  You will be contacted by phone if any changes need to be made immediately.  Otherwise, you will receive a letter about your results with an explanation, but please check with MyChart first.  Please remember to sign up for MyChart if you have not done so, as this will be important to you in the future with finding out test results, communicating by private email, and scheduling acute appointments online when needed.  Please return in 6 months, or sooner if needed

## 2018-10-31 NOTE — Progress Notes (Signed)
Subjective:    Patient ID: Heather Dawson, female    DOB: [birth year], 63 y.o.   MRN: [id number]  HPI  Here to f/u; overall doing ok,  Pt denies chest pain, increasing sob or doe, wheezing, orthopnea, PND, increased LE swelling, palpitations, dizziness or syncope.  Pt denies new neurological symptoms such as new headache, or facial or extremity weakness or numbness.  Pt denies polydipsia, polyuria, or low sugar episode.  Pt states overall good compliance with meds, mostly trying to follow appropriate diet, with wt overall stable,  but little exercise however.  Has lost wt from peak 202 with better diet, and no further GI symptoms with gluten free diet. Was not able to start the [place] last visit.  Due fo mammogram nov 12.  Wt Readings from Last 3 Encounters:  10/31/18 187 lb (84.8 kg)  04/30/18 196 lb (88.9 kg)  02/07/18 195 lb (88.5 kg)  Also c/o bilat hand DJD pain to multiple arthritic finger joints, mild, intermittent, x 3 mo. Past Medical History:  Diagnosis Date  . Abdominal pain, epigastric 12/20/2006  . ALLERGIC RHINITIS 12/25/2006  . Allergy   . ANXIETY 10/16/2006   no per pt  . Arthritis    right shoulder  . BACK PAIN 06/15/2008  . Blood transfusion without reported diagnosis   . BRADYCARDIA 05/04/2008  . C. difficile diarrhea   . Carpal tunnel syndrome 10/16/2006  . Cellulitis and abscess of other specified site 06/15/2008  . CHEST PAIN 09/08/2009  . Chronic low back pain   . CIRRHOSIS 10/16/2006  . CKD (chronic kidney disease)    2008- had Ecoli and caused renal failure, no problems since then  . CKD (chronic kidney disease) stage 3, GFR 30-59 ml/min (HCC) 10/16/2006   Qualifier: Diagnosis of  By: Jenny Reichmann MD, [place] 12/20/2006  . Complication of anesthesia    awake during 2 surgeries  . CONTUSION, LOWER LEG 09/15/2008  . DEPRESSION 10/16/2006   no per pt  . Esophageal reflux 12/25/2006  . HYPERLIPIDEMIA 12/25/2006  . Hyperlipidemia   . HYPERTENSION 08/15/2006   . Hypertension   . HYPOTHYROIDISM 08/15/2006  . Kidney failure, acute ([place]) 2009   as a result of severe E Coli infection  . Left-sided carotid artery disease ([place])   . MIGRAINE HEADACHE 10/16/2006  . Obesity 10/16/2006  . OVARIAN CYST 12/20/2006  . Psoriasis 01/03/2011  . SHINGLES, HX OF 12/20/2006  . Stroke [facility])    3 strokes 2008-no deficits, only on ASA  . TRANSIENT ISCHEMIC ATTACKS, HX OF 12/20/2006  . VERTIGO 09/15/2007  . VITAMIN B12 DEFICIENCY 01/09/2007   Past Surgical History:  Procedure Laterality Date  . ABDOMINAL HYSTERECTOMY  02/2006  . ANTERIOR CERVICAL DECOMP/DISCECTOMY FUSION  2005  . CESAREAN SECTION    . CHOLECYSTECTOMY    . COLONOSCOPY    . ENDARTERECTOMY Left 08/25/2014   Procedure: LEFT CAROTID ENDARTERECTOMY WITH HEMASHIELD PATCH ANGIOPLASTY;  Surgeon: Elam Dutch, MD;  Location: [place];  Service: Vascular;  Laterality: Left;  . NECK SURGERY  2005  . ROTATOR CUFF REPAIR Right 02/2014   Dr Veverly Fells    reports that she has quit smoking. She has never used smokeless tobacco. She reports that she does not drink alcohol or use drugs. family history includes Diabetes in her father, sister, and another family member; Heart disease in her brother, brother, father, and mother; Hypertension in her brother, brother and another family member; Lupus in her mother; Raynaud  syndrome in her mother; Stroke in her father. Allergies  Allergen Reactions  . Cefuroxime Axetil Swelling     edema/swelling  . Ciprofloxacin Swelling  . Levaquin [Levofloxacin In D5w] Other (See Comments)    GI upset  . Lipitor [Atorvastatin] Other (See Comments)    Myalgia   . Lyrica [Pregabalin] Other (See Comments)    HA, confusion, swelling  . Macrodantin [Nitrofurantoin Macrocrystal] Other (See Comments)    GI upset, diarrhea and sob  . Gabapentin Rash   Current Outpatient Medications on File Prior to Visit  Medication Sig Dispense Refill  . acetaminophen (TYLENOL) 500 MG tablet Take 1,000  mg by mouth every 8 (eight) hours as needed (pain).    Marland Kitchen amLODipine (NORVASC) 10 MG tablet Take 1 tablet (10 mg total) by mouth daily. 90 tablet 3  . aspirin 325 MG EC tablet Take 325 mg by mouth daily.      . calcium carbonate (OSCAL) 1500 (600 Ca) MG TABS tablet Take by mouth 2 (two) times daily with a meal.    . Cholecalciferol (VITAMIN D3) 1000 units CAPS Take 2 capsules (2,000 Units total) by mouth daily. 60 capsule 0  . clobetasol (TEMOVATE) 0.05 % external solution Apply 1 application topically daily as needed (scalp psoriasis).   0  . levocetirizine (XYZAL) 5 MG tablet     . levothyroxine (SYNTHROID) 100 MCG tablet TAKE 1 TABLET BY MOUTH DAILY BEFORE BREAKFAST. 90 tablet 3  . lisinopril (PRINIVIL,ZESTRIL) 20 MG tablet Take 1 tablet (20 mg total) by mouth daily. 90 tablet 3  . lovastatin (MEVACOR) 40 MG tablet TAKE 1 TABLET BY MOUTH AT BEDTIME 90 tablet 3  . nystatin (NYSTATIN) powder Apply topically 4 (four) times daily. 30 g 1  . omeprazole (PRILOSEC) 20 MG capsule Take 1 capsule (20 mg total) by mouth daily. 90 capsule 3  . SUMAtriptan (IMITREX) 100 MG tablet TAKE 1 TABLET BY MOUTH EVERY 2 HOURS AS NEEDED FOR MIGRAINE OR HEADACHE, MAY REPEAT IN 2 HOURS IF HEADACHE PERSISTS OR RECURS 10 tablet 2  . triamcinolone cream (KENALOG) 0.1 % Apply 1 application topically 2 (two) times daily. 30 g 0  . Turmeric 500 MG TABS Take 500 mg by mouth daily.     No current facility-administered medications on file prior to visit.    Review of Systems  Constitutional: Negative for other unusual diaphoresis or sweats HENT: Negative for ear discharge or swelling Eyes: Negative for other worsening visual disturbances Respiratory: Negative for stridor or other swelling  Gastrointestinal: Negative for worsening distension or other blood Genitourinary: Negative for retention or other urinary change Musculoskeletal: Negative for other MSK pain or swelling Skin: Negative for color change or other new  lesions Neurological: Negative for worsening tremors and other numbness  Psychiatric/Behavioral: Negative for worsening agitation or other fatigue All other system neg per pt    Objective:   Physical Exam BP 118/72   Pulse (!) 58   Temp 98.5 F (36.9 C) (Oral)   Ht 5\' 2"  (1.575 m)   Wt 187 lb (84.8 kg)   BMI 34.20 kg/m  VS noted,  Constitutional: Pt appears in NAD HENT: Head: NCAT.  Right Ear: External ear normal.  Left Ear: External ear normal.  Eyes: . Pupils are equal, round, and reactive to light. Conjunctivae and EOM are normal Nose: without d/c or deformity Neck: Neck supple. Gross normal ROM Cardiovascular: Normal rate and regular rhythm.   Pulmonary/Chest: Effort normal and breath sounds without rales or wheezing.  Neurological: Pt is alert. At baseline orientation, motor grossly intact Skin: Skin is warm. No rashes, other new lesions, no LE edema Psychiatric: Pt behavior is normal without agitation  No other exam findings Lab Results  Component Value Date   WBC 7.3 10/31/2018   HGB 13.1 10/31/2018   HCT 39.3 10/31/2018   PLT 225.0 10/31/2018   GLUCOSE 101 (H) 10/31/2018   CHOL 158 10/31/2018   TRIG 110.0 10/31/2018   HDL 35.80 (L) 10/31/2018   LDLDIRECT 140.0 08/05/2018   LDLCALC 100 (H) 10/31/2018   ALT 14 10/31/2018   AST 14 10/31/2018   NA 140 10/31/2018   K 4.6 10/31/2018   CL 108 10/31/2018   CREATININE 2.05 (H) 10/31/2018   BUN 44 (H) 10/31/2018   CO2 24 10/31/2018   TSH 0.98 08/05/2018   INR 0.99 08/17/2014   HGBA1C 6.2 10/31/2018   MICROALBUR 3.9 (H) 04/30/2018      Assessment & Plan:

## 2018-11-01 ENCOUNTER — Encounter: Payer: Self-pay | Admitting: Internal Medicine

## 2018-11-01 NOTE — Assessment & Plan Note (Signed)
[place] for volt gel prn,  to f/u any worsening symptoms or concerns

## 2018-11-01 NOTE — Assessment & Plan Note (Signed)
stable overall by history and exam, recent data reviewed with pt, and pt to continue medical treatment as before,  to f/u any worsening symptoms or concerns  

## 2018-11-05 DIAGNOSIS — N179 Acute kidney failure, unspecified: Secondary | ICD-10-CM | POA: Diagnosis not present

## 2018-11-05 DIAGNOSIS — Z1159 Encounter for screening for other viral diseases: Secondary | ICD-10-CM | POA: Diagnosis not present

## 2018-11-05 DIAGNOSIS — N183 Chronic kidney disease, stage 3 (moderate): Secondary | ICD-10-CM | POA: Diagnosis not present

## 2018-11-05 DIAGNOSIS — I129 Hypertensive chronic kidney disease with stage 1 through stage 4 chronic kidney disease, or unspecified chronic kidney disease: Secondary | ICD-10-CM | POA: Diagnosis not present

## 2018-11-05 LAB — PTH, INTACT AND CALCIUM
Calcium: 9.4 mg/dL (ref 8.6–10.4)
PTH: 33 pg/mL (ref 14–64)

## 2018-11-10 ENCOUNTER — Telehealth: Payer: Self-pay | Admitting: *Deleted

## 2018-11-10 NOTE — Telephone Encounter (Signed)
PA for Diclofenac 1% gel PA initiated via CoverMyMeds.  Key: [id number]  Rx #: [id number]

## 2018-11-10 NOTE — Telephone Encounter (Signed)
Heather Dawson calling with [employer] stated that medication was approved   Cb#[id number] option 5

## 2018-11-13 ENCOUNTER — Encounter: Payer: Self-pay | Admitting: Internal Medicine

## 2018-12-02 ENCOUNTER — Other Ambulatory Visit: Payer: Self-pay

## 2018-12-02 ENCOUNTER — Ambulatory Visit (AMBULATORY_SURGERY_CENTER): Payer: Self-pay

## 2018-12-02 VITALS — Temp 96.6°F | Ht 62.0 in | Wt 181.0 lb

## 2018-12-02 DIAGNOSIS — Z86010 Personal history of colonic polyps: Secondary | ICD-10-CM

## 2018-12-02 MED ORDER — NA SULFATE-K SULFATE-MG SULF 17.5-3.13-1.6 GM/177ML PO SOLN: 1.0000 | Freq: Once | ORAL | 0 refills | Status: AC

## 2018-12-02 NOTE — Progress Notes (Signed)
Denies allergies to eggs or soy products. Denies complication of anesthesia or sedation. Denies use of weight loss medication. Denies use of O2.   Emmi instructions given for colonoscopy.  Patient was given a sample of Suprep. Patient is scheduled for a colonoscopy on 12/16/18. Patient declined Covid Screening.

## 2018-12-05 ENCOUNTER — Encounter: Payer: Self-pay | Admitting: Internal Medicine

## 2018-12-15 ENCOUNTER — Telehealth: Payer: Self-pay

## 2018-12-15 DIAGNOSIS — Z0120 Encounter for dental examination and cleaning without abnormal findings: Secondary | ICD-10-CM | POA: Diagnosis not present

## 2018-12-15 NOTE — Telephone Encounter (Signed)
Covid-19 screening questions   Do you now or have you had a fever in the last 14 days? NO   Do you have any respiratory symptoms of shortness of breath or cough now or in the last 14 days? NO  Do you have any family members or close contacts with diagnosed or suspected Covid-19 in the past 14 days? NO  Have you been tested for Covid-19 and found to be positive? NO        

## 2018-12-16 ENCOUNTER — Ambulatory Visit (AMBULATORY_SURGERY_CENTER): Payer: Medicare Other | Admitting: Internal Medicine

## 2018-12-16 ENCOUNTER — Encounter: Payer: Self-pay | Admitting: Internal Medicine

## 2018-12-16 ENCOUNTER — Other Ambulatory Visit: Payer: Self-pay

## 2018-12-16 VITALS — BP 125/62 | HR 64 | Temp 97.9°F | Resp 19 | Ht 62.0 in | Wt 181.0 lb

## 2018-12-16 DIAGNOSIS — I129 Hypertensive chronic kidney disease with stage 1 through stage 4 chronic kidney disease, or unspecified chronic kidney disease: Secondary | ICD-10-CM | POA: Diagnosis not present

## 2018-12-16 DIAGNOSIS — D125 Benign neoplasm of sigmoid colon: Secondary | ICD-10-CM | POA: Diagnosis not present

## 2018-12-16 DIAGNOSIS — Z86010 Personal history of colonic polyps: Secondary | ICD-10-CM | POA: Diagnosis not present

## 2018-12-16 DIAGNOSIS — N183 Chronic kidney disease, stage 3 unspecified: Secondary | ICD-10-CM | POA: Diagnosis not present

## 2018-12-16 MED ORDER — SODIUM CHLORIDE 0.9 % IV SOLN: 500.0000 mL | Freq: Once | INTRAVENOUS | Status: DC

## 2018-12-16 NOTE — Progress Notes (Signed)
JB- Temp CW- Vitals 

## 2018-12-16 NOTE — Progress Notes (Signed)
Report to PACU, RN, vss, BBS= Clear.  

## 2018-12-16 NOTE — Progress Notes (Signed)
Pt's states no medical or surgical changes since previsit or office visit. 

## 2018-12-16 NOTE — Op Note (Signed)
Heather Dawson: Heather Dawson Procedure Date: 12/16/2018 8:36 AM MRN: [id number] Endoscopist: Docia Chuck. Henrene Pastor , MD Age: 63 Referring MD:  Date of Birth: 1955-05-28 Gender: Female Account #: [id number] Procedure:                Colonoscopy with cold snare polypectomy x 1 Indications:              High risk colon cancer surveillance: Personal                            history of non-advanced adenoma. Previous                            examination August 2015 (Dr. Deatra Ina) Medicines:                Monitored Anesthesia Care Procedure:                Pre-Anesthesia Assessment:                           - Prior to the procedure, a History and Physical                            was performed, and patient medications and                            allergies were reviewed. The patient's tolerance of                            previous anesthesia was also reviewed. The risks                            and benefits of the procedure and the sedation                            options and risks were discussed with the patient.                            All questions were answered, and informed consent                            was obtained. Prior Anticoagulants: The patient has                            taken no previous anticoagulant or antiplatelet                            agents. ASA Grade Assessment: II - A patient with                            mild systemic disease. After reviewing the risks                            and benefits, the patient was deemed in  satisfactory condition to undergo the procedure.                           After obtaining informed consent, the colonoscope                            was passed under direct vision. Throughout the                            procedure, the patient's blood pressure, pulse, and                            oxygen saturations were monitored continuously. The   Colonoscope was introduced through the anus and                            advanced to the the cecum, identified by                            appendiceal orifice and ileocecal valve. The                            ileocecal valve, appendiceal orifice, and rectum                            were photographed. The quality of the bowel                            preparation was excellent. The colonoscopy was                            performed without difficulty. The patient tolerated                            the procedure well. The bowel preparation used was                            SUPREP via split dose instruction. Scope In: 8:57:46 AM Scope Out: 9:11:22 AM Scope Withdrawal Time: 0 hours 8 minutes 12 seconds  Total Procedure Duration: 0 hours 13 minutes 36 seconds  Findings:                 A 4 mm polyp was found in the sigmoid colon. The                            polyp was pedunculated. The polyp was removed with                            a cold snare. Resection and retrieval were complete.                           A few small-mouthed diverticula were found in the  sigmoid colon.                           The exam was otherwise without abnormality on                            direct and retroflexion views. Complications:            No immediate complications. Estimated blood loss:                            None. Estimated Blood Loss:     Estimated blood loss: none. Impression:               - One 4 mm polyp in the sigmoid colon, removed with                            a cold snare. Resected and retrieved.                           - Diverticulosis in the sigmoid colon.                           - The examination was otherwise normal on direct                            and retroflexion views. Recommendation:           - Repeat colonoscopy in 7 years for surveillance.                           - Patient has a contact number available for                             emergencies. The signs and symptoms of potential                            delayed complications were discussed with the                            patient. Return to normal activities tomorrow.                            Written discharge instructions were provided to the                            patient.                           - Resume previous diet.                           - Continue present medications.                           - Await pathology results. Docia Chuck. Henrene Pastor, MD 12/16/2018 9:16:53 AM This report has been signed electronically.

## 2018-12-16 NOTE — Patient Instructions (Signed)
YOU HAD AN ENDOSCOPIC PROCEDURE TODAY AT THE [facility]:   Refer to the procedure report that was given to you for any specific questions about what was found during the examination.  If the procedure report does not answer your questions, please call your gastroenterologist to clarify.  If you requested that your care partner not be given the details of your procedure findings, then the procedure report has been included in a sealed envelope for you to review at your convenience later.  YOU SHOULD EXPECT: Some feelings of bloating in the abdomen. Passage of more gas than usual.  Walking can help get rid of the air that was put into your GI tract during the procedure and reduce the bloating. If you had a lower endoscopy (such as a colonoscopy or flexible sigmoidoscopy) you may notice spotting of blood in your stool or on the toilet paper. If you underwent a bowel prep for your procedure, you may not have a normal bowel movement for a few days.  Please Note:  You might notice some irritation and congestion in your nose or some drainage.  This is from the oxygen used during your procedure.  There is no need for concern and it should clear up in a day or so.  SYMPTOMS TO REPORT IMMEDIATELY:   Following lower endoscopy (colonoscopy or flexible sigmoidoscopy):  Excessive amounts of blood in the stool  Significant tenderness or worsening of abdominal pains  Swelling of the abdomen that is new, acute  Fever of 100F or higher   For urgent or emergent issues, a gastroenterologist can be reached at any hour by calling [phone].   DIET:  We do recommend a small meal at first, but then you may proceed to your regular diet.  Drink plenty of fluids but you should avoid alcoholic beverages for 24 hours.  MEDICATIONS: Continue present medications.  Please see handouts given to you by your recovery nurse.  ACTIVITY:  You should plan to take it easy for the rest of today and you should  NOT DRIVE or use heavy machinery until tomorrow (because of the sedation medicines used during the test).    FOLLOW UP: Our staff will call the number listed on your records 48-72 hours following your procedure to check on you and address any questions or concerns that you may have regarding the information given to you following your procedure. If we do not reach you, we will leave a message.  We will attempt to reach you two times.  During this call, we will ask if you have developed any symptoms of COVID 19. If you develop any symptoms (ie: fever, flu-like symptoms, shortness of breath, cough etc.) before then, please call [phone].  If you test positive for Covid 19 in the 2 weeks post procedure, please call and report this information to us.    If any biopsies were taken you will be contacted by phone or by letter within the next 1-3 weeks.  Please call us at [phone] if you have not heard about the biopsies in 3 weeks.   Thank you for allowing us to provide for your healthcare needs today.   SIGNATURES/CONFIDENTIALITY: You and/or your care partner have signed paperwork which will be entered into your electronic medical record.  These signatures attest to the fact that that the information above on your After Visit Summary has been reviewed and is understood.  Full responsibility of the confidentiality of this discharge information lies with you and/or   your care-partner. 

## 2018-12-16 NOTE — Progress Notes (Signed)
Called to room to assist during endoscopic procedure.  Patient ID and intended procedure confirmed with present staff. Received instructions for my participation in the procedure from the performing physician.  

## 2018-12-18 ENCOUNTER — Telehealth: Payer: Self-pay | Admitting: *Deleted

## 2018-12-18 ENCOUNTER — Telehealth: Payer: Self-pay

## 2018-12-18 NOTE — Telephone Encounter (Signed)
Left message on follow up call. 

## 2018-12-18 NOTE — Telephone Encounter (Signed)
1. Have you developed a fever since your procedure? no  2.   Have you had an respiratory symptoms (SOB or cough) since your procedure? no  3.   Have you tested positive for COVID 19 since your procedure no  4.   Have you had any family members/close contacts diagnosed with the COVID 19 since your procedure?  no   If yes to any of these questions please route to Joylene John, RN and Alphonsa Gin, Therapist, sports.     Follow up Call-  Call back number 12/16/2018  Post procedure Call Back phone  # [phone]  Permission to leave phone message Yes  Some recent data might be hidden     Patient questions:  Do you have a fever, pain , or abdominal swelling? No. Pain Score  0 *  Have you tolerated food without any problems? Yes.    Have you been able to return to your normal activities? Yes.    Do you have any questions about your discharge instructions: Diet   No. Medications  No. Follow up visit  No.  Do you have questions or concerns about your Care? No.  Actions: * If pain score is 4 or above: No action needed, pain <4.

## 2018-12-23 ENCOUNTER — Encounter: Payer: Self-pay | Admitting: Internal Medicine

## 2019-01-01 DIAGNOSIS — Z1231 Encounter for screening mammogram for malignant neoplasm of breast: Secondary | ICD-10-CM | POA: Diagnosis not present

## 2019-01-01 DIAGNOSIS — Z124 Encounter for screening for malignant neoplasm of cervix: Secondary | ICD-10-CM | POA: Diagnosis not present

## 2019-01-01 DIAGNOSIS — Z6831 Body mass index (BMI) 31.0-31.9, adult: Secondary | ICD-10-CM | POA: Diagnosis not present

## 2019-01-01 DIAGNOSIS — Z01419 Encounter for gynecological examination (general) (routine) without abnormal findings: Secondary | ICD-10-CM | POA: Diagnosis not present

## 2019-01-23 DIAGNOSIS — E785 Hyperlipidemia, unspecified: Secondary | ICD-10-CM | POA: Diagnosis not present

## 2019-01-23 DIAGNOSIS — N179 Acute kidney failure, unspecified: Secondary | ICD-10-CM | POA: Diagnosis not present

## 2019-01-23 DIAGNOSIS — N183 Chronic kidney disease, stage 3 unspecified: Secondary | ICD-10-CM | POA: Diagnosis not present

## 2019-01-23 DIAGNOSIS — I129 Hypertensive chronic kidney disease with stage 1 through stage 4 chronic kidney disease, or unspecified chronic kidney disease: Secondary | ICD-10-CM | POA: Diagnosis not present

## 2019-02-18 DIAGNOSIS — N183 Chronic kidney disease, stage 3 unspecified: Secondary | ICD-10-CM | POA: Diagnosis not present

## 2019-04-09 ENCOUNTER — Ambulatory Visit: Payer: Medicare Other | Admitting: Vascular Surgery

## 2019-04-09 ENCOUNTER — Encounter (HOSPITAL_COMMUNITY): Payer: Medicare Other

## 2019-04-16 ENCOUNTER — Encounter (HOSPITAL_COMMUNITY): Payer: Medicare Other

## 2019-04-16 ENCOUNTER — Ambulatory Visit: Payer: Medicare Other | Admitting: Vascular Surgery

## 2019-05-01 ENCOUNTER — Ambulatory Visit (INDEPENDENT_AMBULATORY_CARE_PROVIDER_SITE_OTHER): Payer: Medicare Other | Admitting: Internal Medicine

## 2019-05-01 ENCOUNTER — Encounter: Payer: Self-pay | Admitting: Internal Medicine

## 2019-05-01 ENCOUNTER — Other Ambulatory Visit: Payer: Self-pay

## 2019-05-01 VITALS — BP 134/84 | HR 52 | Temp 97.8°F | Ht 62.0 in | Wt 156.0 lb

## 2019-05-01 DIAGNOSIS — E538 Deficiency of other specified B group vitamins: Secondary | ICD-10-CM

## 2019-05-01 DIAGNOSIS — E611 Iron deficiency: Secondary | ICD-10-CM | POA: Diagnosis not present

## 2019-05-01 DIAGNOSIS — E119 Type 2 diabetes mellitus without complications: Secondary | ICD-10-CM

## 2019-05-01 DIAGNOSIS — I10 Essential (primary) hypertension: Secondary | ICD-10-CM

## 2019-05-01 DIAGNOSIS — E559 Vitamin D deficiency, unspecified: Secondary | ICD-10-CM

## 2019-05-01 DIAGNOSIS — Z0000 Encounter for general adult medical examination without abnormal findings: Secondary | ICD-10-CM | POA: Diagnosis not present

## 2019-05-01 DIAGNOSIS — N183 Chronic kidney disease, stage 3 unspecified: Secondary | ICD-10-CM

## 2019-05-01 LAB — BASIC METABOLIC PANEL
BUN: 24 mg/dL — ABNORMAL HIGH (ref 6–23)
CO2: 26 mEq/L (ref 19–32)
Calcium: 9.5 mg/dL (ref 8.4–10.5)
Chloride: 105 mEq/L (ref 96–112)
Creatinine, Ser: 1.39 mg/dL — ABNORMAL HIGH (ref 0.40–1.20)
GFR: 38.37 mL/min — ABNORMAL LOW (ref >60.00)
Glucose, Bld: 82 mg/dL (ref 70–99)
Potassium: 4.3 mEq/L (ref 3.5–5.1)
Sodium: 139 mEq/L (ref 135–145)

## 2019-05-01 LAB — IBC PANEL
Iron: 81 ug/dL (ref 42–145)
Saturation Ratios: 23.2 % (ref 20.0–50.0)
Transferrin: 249 mg/dL (ref 212.0–360.0)

## 2019-05-01 LAB — URINALYSIS, ROUTINE W REFLEX MICROSCOPIC
Bilirubin Urine: NEGATIVE
Hgb urine dipstick: NEGATIVE
Ketones, ur: NEGATIVE
Leukocytes,Ua: NEGATIVE
Nitrite: NEGATIVE
Specific Gravity, Urine: 1.02 (ref 1.000–1.030)
Total Protein, Urine: NEGATIVE
Urine Glucose: NEGATIVE
Urobilinogen, UA: 0.2 (ref 0.0–1.0)
pH: 6 (ref 5.0–8.0)

## 2019-05-01 LAB — CBC WITH DIFFERENTIAL/PLATELET
Basophils Absolute: 0 10*3/uL (ref 0.0–0.1)
Basophils Relative: 0.5 % (ref 0.0–3.0)
Eosinophils Absolute: 0.3 10*3/uL (ref 0.0–0.7)
Eosinophils Relative: 4.7 % (ref 0.0–5.0)
HCT: 39.9 % (ref 36.0–46.0)
Hemoglobin: 13.3 g/dL (ref 12.0–15.0)
Lymphocytes Relative: 25.5 % (ref 12.0–46.0)
Lymphs Abs: 1.5 10*3/uL (ref 0.7–4.0)
MCHC: 33.3 g/dL (ref 30.0–36.0)
MCV: 96.1 fl (ref 78.0–100.0)
Monocytes Absolute: 0.5 10*3/uL (ref 0.1–1.0)
Monocytes Relative: 7.9 % (ref 3.0–12.0)
Neutro Abs: 3.6 10*3/uL (ref 1.4–7.7)
Neutrophils Relative %: 61.4 % (ref 43.0–77.0)
Platelets: 209 10*3/uL (ref 150.0–400.0)
RBC: 4.16 Mil/uL (ref 3.87–5.11)
RDW: 12.8 % (ref 11.5–15.5)
WBC: 5.8 10*3/uL (ref 4.0–10.5)

## 2019-05-01 LAB — LIPID PANEL
Cholesterol: 139 mg/dL (ref 0–200)
HDL: 45.6 mg/dL (ref >39.00)
LDL Cholesterol: 78 mg/dL (ref 0–99)
NonHDL: 93.51
Total CHOL/HDL Ratio: 3
Triglycerides: 78 mg/dL (ref 0.0–149.0)
VLDL: 15.6 mg/dL (ref 0.0–40.0)

## 2019-05-01 LAB — HEPATIC FUNCTION PANEL
ALT: 14 U/L (ref 0–35)
AST: 17 U/L (ref 0–37)
Albumin: 4.1 g/dL (ref 3.5–5.2)
Alkaline Phosphatase: 59 U/L (ref 39–117)
Bilirubin, Direct: 0.1 mg/dL (ref 0.0–0.3)
Total Bilirubin: 0.6 mg/dL (ref 0.2–1.2)
Total Protein: 6.7 g/dL (ref 6.0–8.3)

## 2019-05-01 LAB — VITAMIN D 25 HYDROXY (VIT D DEFICIENCY, FRACTURES): VITD: 70.11 ng/mL (ref 30.00–100.00)

## 2019-05-01 LAB — HEMOGLOBIN A1C: Hgb A1c MFr Bld: 5.5 % (ref 4.6–6.5)

## 2019-05-01 LAB — MICROALBUMIN / CREATININE URINE RATIO
Creatinine,U: 128 mg/dL
Microalb Creat Ratio: 2.5 mg/g (ref 0.0–30.0)
Microalb, Ur: 3.3 mg/dL — ABNORMAL HIGH (ref 0.0–1.9)

## 2019-05-01 LAB — VITAMIN B12: Vitamin B-12: 193 pg/mL — ABNORMAL LOW (ref 211–911)

## 2019-05-01 LAB — TSH: TSH: 0.04 u[IU]/mL — ABNORMAL LOW (ref 0.35–4.50)

## 2019-05-01 NOTE — Patient Instructions (Signed)

## 2019-05-01 NOTE — Progress Notes (Addendum)
Subjective:    Patient ID: Heather Dawson, female    DOB: 1955/06/18, 64 y.o.   MRN: [id number]  HPI  Here for wellness and f/u;  Overall doing ok;  Pt denies Chest pain, worsening SOB, DOE, wheezing, orthopnea, PND, worsening LE edema, palpitations, dizziness or syncope.  Pt denies neurological change such as new headache, facial or extremity weakness.  Pt denies polydipsia, polyuria, or low sugar symptoms. Pt states overall good compliance with treatment and medications, good tolerability, and has been trying to follow appropriate diet.  Pt denies worsening depressive symptoms, suicidal ideation or panic. No fever, night sweats, wt loss, loss of appetite, or other constitutional symptoms.  Pt states good ability with ADL's, has low fall risk, home safety reviewed and adequate, no other significant changes in hearing or vision, and only occasionally active with exercise. Now walking 4 miles in the AM, and 1 mile later in the day with significant wt loss, stopped her BP meds completely > 2 wks due to dizzy and weakness and BP > 110 sbp several times.  Her goal is 60 lbs after peak wt 201. BP Readings from Last 3 Encounters:  05/01/19 134/84  12/16/18 125/62  10/31/18 118/72   Wt Readings from Last 3 Encounters:  05/01/19 156 lb (70.8 kg)  12/16/18 181 lb (82.1 kg)  12/02/18 181 lb (82.1 kg)   Past Medical History:  Diagnosis Date  . Abdominal pain, epigastric 12/20/2006  . ALLERGIC RHINITIS 12/25/2006  . Allergy   . ANXIETY 10/16/2006   no per pt  . Arthritis    right shoulder  . BACK PAIN 06/15/2008  . Blood transfusion without reported diagnosis   . BRADYCARDIA 05/04/2008  . C. difficile diarrhea   . Carpal tunnel syndrome 10/16/2006  . Cellulitis and abscess of other specified site 06/15/2008  . CHEST PAIN 09/08/2009  . Chronic low back pain   . CIRRHOSIS 10/16/2006  . CKD (chronic kidney disease)    2008- had Ecoli and caused renal failure, no problems since then  . CKD (chronic  kidney disease) stage 3, GFR 30-59 ml/min 10/16/2006   Qualifier: Diagnosis of  By: Jenny Reichmann MD, [place] 12/20/2006  . Complication of anesthesia    awake during 2 surgeries  . CONTUSION, LOWER LEG 09/15/2008  . DEPRESSION 10/16/2006   no per pt  . Diabetes mellitus without complication ([place])   . Esophageal reflux 12/25/2006  . HYPERLIPIDEMIA 12/25/2006  . Hyperlipidemia   . HYPERTENSION 08/15/2006  . Hypertension   . HYPOTHYROIDISM 08/15/2006  . Kidney failure, acute ([place]) 2009   as a result of severe E Coli infection  . Left-sided carotid artery disease ([place])   . MIGRAINE HEADACHE 10/16/2006  . Obesity 10/16/2006  . OVARIAN CYST 12/20/2006  . Psoriasis 01/03/2011  . SHINGLES, HX OF 12/20/2006  . Stroke [facility])    3 strokes 2008-no deficits, only on ASA  . TRANSIENT ISCHEMIC ATTACKS, HX OF 12/20/2006  . VERTIGO 09/15/2007  . VITAMIN B12 DEFICIENCY 01/09/2007   Past Surgical History:  Procedure Laterality Date  . ABDOMINAL HYSTERECTOMY  02/2006  . ANTERIOR CERVICAL DECOMP/DISCECTOMY FUSION  2005  . CESAREAN SECTION    . CHOLECYSTECTOMY    . COLONOSCOPY    . ENDARTERECTOMY Left 08/25/2014   Procedure: LEFT CAROTID ENDARTERECTOMY WITH HEMASHIELD PATCH ANGIOPLASTY;  Surgeon: Elam Dutch, MD;  Location: [place];  Service: Vascular;  Laterality: Left;  . NECK SURGERY  2005  . ROTATOR  CUFF REPAIR Right 02/2014   Dr Veverly Fells    reports that she has quit smoking. She has never used smokeless tobacco. She reports that she does not drink alcohol or use drugs. family history includes Diabetes in her father, sister, and another family member; Heart disease in her brother, brother, father, and mother; Hypertension in her brother, brother and another family member; Lupus in her mother; Raynaud syndrome in her mother; Stroke in her father. Allergies  Allergen Reactions  . Cefuroxime Axetil Swelling     edema/swelling  . Ciprofloxacin Swelling  . Levaquin [Levofloxacin In D5w] Other  (See Comments)    GI upset  . Lipitor [Atorvastatin] Other (See Comments)    Myalgia   . Lyrica [Pregabalin] Other (See Comments)    HA, confusion, swelling  . Macrodantin [Nitrofurantoin Macrocrystal] Other (See Comments)    GI upset, diarrhea and sob  . Gabapentin Rash   Current Outpatient Medications on File Prior to Visit  Medication Sig Dispense Refill  . acetaminophen (TYLENOL) 500 MG tablet Take 1,000 mg by mouth every 8 (eight) hours as needed (pain).    Marland Kitchen aspirin 325 MG EC tablet Take 325 mg by mouth daily.      . Cholecalciferol (VITAMIN D3) 1000 units CAPS Take 2 capsules (2,000 Units total) by mouth daily. 60 capsule 0  . clobetasol (TEMOVATE) 0.05 % external solution Apply 1 application topically daily as needed (scalp psoriasis).   0  . diclofenac sodium (VOLTAREN) 1 % GEL Apply 4 g topically 4 (four) times daily as needed. 400 g 11  . levocetirizine (XYZAL) 5 MG tablet     . lovastatin (MEVACOR) 40 MG tablet TAKE 1 TABLET BY MOUTH AT BEDTIME 90 tablet 3  . mupirocin cream (BACTROBAN) 2 % Apply 1 application topically 2 (two) times daily.    . SUMAtriptan (IMITREX) 100 MG tablet TAKE 1 TABLET BY MOUTH EVERY 2 HOURS AS NEEDED FOR MIGRAINE OR HEADACHE, MAY REPEAT IN 2 HOURS IF HEADACHE PERSISTS OR RECURS 10 tablet 2  . calcium carbonate (OSCAL) 1500 (600 Ca) MG TABS tablet Take by mouth 2 (two) times daily with a meal.    . omeprazole (PRILOSEC) 20 MG capsule Take 1 capsule (20 mg total) by mouth daily. 90 capsule 3  . Turmeric 500 MG TABS Take 500 mg by mouth daily.     Current Facility-Administered Medications on File Prior to Visit  Medication Dose Route Frequency Provider Last Rate Last Admin  . 0.9 %  sodium chloride infusion  500 mL Intravenous Once Irene Shipper, MD       Review of Systems All otherwise neg per pt     Objective:   Physical Exam BP 134/84   Pulse (!) 52   Temp 97.8 F (36.6 C)   Ht 5\' 2"  (1.575 m)   Wt 156 lb (70.8 kg)   SpO2 99%   BMI  28.53 kg/m  VS noted,  Constitutional: Pt appears in NAD HENT: Head: NCAT.  Right Ear: External ear normal.  Left Ear: External ear normal.  Eyes: . Pupils are equal, round, and reactive to light. Conjunctivae and EOM are normal Nose: without d/c or deformity Neck: Neck supple. Gross normal ROM Cardiovascular: Normal rate and regular rhythm.   Pulmonary/Chest: Effort normal and breath sounds without rales or wheezing.  Abd:  Soft, NT, ND, + BS, no organomegaly Neurological: Pt is alert. At baseline orientation, motor grossly intact Skin: Skin is warm. No rashes, other new lesions, no  LE edema Psychiatric: Pt behavior is normal without agitation  All otherwise neg per pt Lab Results  Component Value Date   WBC 5.8 05/01/2019   HGB 13.3 05/01/2019   HCT 39.9 05/01/2019   PLT 209.0 05/01/2019   GLUCOSE 82 05/01/2019   CHOL 139 05/01/2019   TRIG 78.0 05/01/2019   HDL 45.60 05/01/2019   LDLDIRECT 140.0 08/05/2018   LDLCALC 78 05/01/2019   ALT 14 05/01/2019   AST 17 05/01/2019   NA 139 05/01/2019   K 4.3 05/01/2019   CL 105 05/01/2019   CREATININE 1.39 (H) 05/01/2019   BUN 24 (H) 05/01/2019   CO2 26 05/01/2019   TSH 0.04 (L) 05/01/2019   INR 0.99 08/17/2014   HGBA1C 5.5 05/01/2019   MICROALBUR 3.3 (H) 05/01/2019      Assessment & Plan:

## 2019-05-03 ENCOUNTER — Other Ambulatory Visit: Payer: Self-pay | Admitting: Internal Medicine

## 2019-05-03 ENCOUNTER — Encounter: Payer: Self-pay | Admitting: Internal Medicine

## 2019-05-03 DIAGNOSIS — E039 Hypothyroidism, unspecified: Secondary | ICD-10-CM

## 2019-05-03 MED ORDER — LEVOTHYROXINE SODIUM 75 MCG PO TABS: 75.0000 ug | ORAL_TABLET | Freq: Every day | ORAL | 3 refills | Status: DC

## 2019-05-03 MED ORDER — VITAMIN B-12 1000 MCG PO TABS: 1000.0000 ug | ORAL_TABLET | Freq: Every day | ORAL | 1 refills | Status: DC

## 2019-05-03 NOTE — Assessment & Plan Note (Signed)
stable overall by history and exam, recent data reviewed with pt, and pt to continue medical treatment as before,  to f/u any worsening symptoms or concerns  

## 2019-05-03 NOTE — Assessment & Plan Note (Signed)

## 2019-05-06 DIAGNOSIS — N183 Chronic kidney disease, stage 3 unspecified: Secondary | ICD-10-CM | POA: Diagnosis not present

## 2019-05-06 DIAGNOSIS — N179 Acute kidney failure, unspecified: Secondary | ICD-10-CM | POA: Diagnosis not present

## 2019-05-06 DIAGNOSIS — E785 Hyperlipidemia, unspecified: Secondary | ICD-10-CM | POA: Diagnosis not present

## 2019-05-06 DIAGNOSIS — I129 Hypertensive chronic kidney disease with stage 1 through stage 4 chronic kidney disease, or unspecified chronic kidney disease: Secondary | ICD-10-CM | POA: Diagnosis not present

## 2019-05-07 ENCOUNTER — Other Ambulatory Visit: Payer: Self-pay

## 2019-05-07 ENCOUNTER — Ambulatory Visit: Payer: Medicare Other | Admitting: Vascular Surgery

## 2019-05-07 ENCOUNTER — Ambulatory Visit (HOSPITAL_COMMUNITY)
Admission: RE | Admit: 2019-05-07 | Discharge: 2019-05-07 | Disposition: A | Discharge status: Home / Self Care | Payer: Medicare Other | Source: Ambulatory Visit | Attending: Vascular Surgery | Admitting: Vascular Surgery

## 2019-05-07 ENCOUNTER — Encounter: Payer: Self-pay | Admitting: Vascular Surgery

## 2019-05-07 VITALS — BP 144/69 | HR 50 | Temp 97.7°F | Resp 18 | Ht 62.0 in | Wt 153.0 lb

## 2019-05-07 DIAGNOSIS — I6523 Occlusion and stenosis of bilateral carotid arteries: Secondary | ICD-10-CM

## 2019-05-07 DIAGNOSIS — I6522 Occlusion and stenosis of left carotid artery: Secondary | ICD-10-CM

## 2019-05-07 NOTE — Progress Notes (Signed)
Patient is a 64 year old female who returns for follow-up today.  She previously underwent left carotid endarterectomy in 2016.  She is on a statin and aspirin.  She has had no new neurologic events.  She has lost 50 pounds intentionally and is exercising more and has changed her diet to a healthier lifestyle overall.  I congratulated her on this today.  She is a former smoker but quit over 30 years ago.  Other chronic medical problems include low back pain, cirrhosis, chronic kidney disease stage III, diabetes hyperlipidemia, hypertension.  All of these are currently stable.  Past Medical History:  Diagnosis Date  . Abdominal pain, epigastric 12/20/2006  . ALLERGIC RHINITIS 12/25/2006  . Allergy   . ANXIETY 10/16/2006   no per pt  . Arthritis    right shoulder  . BACK PAIN 06/15/2008  . Blood transfusion without reported diagnosis   . BRADYCARDIA 05/04/2008  . C. difficile diarrhea   . Carpal tunnel syndrome 10/16/2006  . Cellulitis and abscess of other specified site 06/15/2008  . CHEST PAIN 09/08/2009  . Chronic low back pain   . CIRRHOSIS 10/16/2006  . CKD (chronic kidney disease)    2008- had Ecoli and caused renal failure, no problems since then  . CKD (chronic kidney disease) stage 3, GFR 30-59 ml/min 10/16/2006   Qualifier: Diagnosis of  By: Jenny Reichmann MD, [place] 12/20/2006  . Complication of anesthesia    awake during 2 surgeries  . CONTUSION, LOWER LEG 09/15/2008  . DEPRESSION 10/16/2006   no per pt  . Diabetes mellitus without complication ([place])   . Esophageal reflux 12/25/2006  . HYPERLIPIDEMIA 12/25/2006  . Hyperlipidemia   . HYPERTENSION 08/15/2006  . Hypertension   . HYPOTHYROIDISM 08/15/2006  . Kidney failure, acute ([place]) 2009   as a result of severe E Coli infection  . Left-sided carotid artery disease ([place])   . MIGRAINE HEADACHE 10/16/2006  . Obesity 10/16/2006  . OVARIAN CYST 12/20/2006  . Psoriasis 01/03/2011  . SHINGLES, HX OF 12/20/2006  . Stroke  [facility])    3 strokes 2008-no deficits, only on ASA  . TRANSIENT ISCHEMIC ATTACKS, HX OF 12/20/2006  . VERTIGO 09/15/2007  . VITAMIN B12 DEFICIENCY 01/09/2007    Past Surgical History:  Procedure Laterality Date  . ABDOMINAL HYSTERECTOMY  02/2006  . ANTERIOR CERVICAL DECOMP/DISCECTOMY FUSION  2005  . CESAREAN SECTION    . CHOLECYSTECTOMY    . COLONOSCOPY    . ENDARTERECTOMY Left 08/25/2014   Procedure: LEFT CAROTID ENDARTERECTOMY WITH HEMASHIELD PATCH ANGIOPLASTY;  Surgeon: Elam Dutch, MD;  Location: [place];  Service: Vascular;  Laterality: Left;  . NECK SURGERY  2005  . ROTATOR CUFF REPAIR Right 02/2014   Dr Veverly Fells    Current Outpatient Medications on File Prior to Visit  Medication Sig Dispense Refill  . acetaminophen (TYLENOL) 500 MG tablet Take 1,000 mg by mouth every 8 (eight) hours as needed (pain).    Marland Kitchen aspirin 325 MG EC tablet Take 325 mg by mouth daily.      . Cholecalciferol (VITAMIN D3) 1000 units CAPS Take 2 capsules (2,000 Units total) by mouth daily. 60 capsule 0  . clobetasol (TEMOVATE) 0.05 % external solution Apply 1 application topically daily as needed (scalp psoriasis).   0  . diclofenac sodium (VOLTAREN) 1 % GEL Apply 4 g topically 4 (four) times daily as needed. 400 g 11  . levocetirizine (XYZAL) 5 MG tablet     .  levothyroxine (SYNTHROID) 75 MCG tablet Take 1 tablet (75 mcg total) by mouth daily. 90 tablet 3  . lovastatin (MEVACOR) 40 MG tablet TAKE 1 TABLET BY MOUTH AT BEDTIME 90 tablet 3  . mupirocin cream (BACTROBAN) 2 % Apply 1 application topically 2 (two) times daily.    . SUMAtriptan (IMITREX) 100 MG tablet TAKE 1 TABLET BY MOUTH EVERY 2 HOURS AS NEEDED FOR MIGRAINE OR HEADACHE, MAY REPEAT IN 2 HOURS IF HEADACHE PERSISTS OR RECURS 10 tablet 2  . vitamin B-12 (CYANOCOBALAMIN) 1000 MCG tablet Take 1 tablet (1,000 mcg total) by mouth daily. 90 tablet 1   Current Facility-Administered Medications on File Prior to Visit  Medication Dose Route Frequency  Provider Last Rate Last Admin  . 0.9 %  sodium chloride infusion  500 mL Intravenous Once Irene Shipper, MD        Allergies  Allergen Reactions  . Cefuroxime Axetil Swelling     edema/swelling  . Ciprofloxacin Swelling  . Levaquin [Levofloxacin In D5w] Other (See Comments)    GI upset  . Lipitor [Atorvastatin] Other (See Comments)    Myalgia   . Lyrica [Pregabalin] Other (See Comments)    HA, confusion, swelling  . Macrodantin [Nitrofurantoin Macrocrystal] Other (See Comments)    GI upset, diarrhea and sob  . Gabapentin Rash   Social History   Socioeconomic History  . Marital status: Married    Spouse name: Not on file  . Number of children: Not on file  . Years of education: Not on file  . Highest education level: Not on file  Occupational History  . Not on file  Tobacco Use  . Smoking status: Former Research scientist (life sciences)  . Smokeless tobacco: Never Used  . Tobacco comment: quit smoking over 30 years ago  Substance and Sexual Activity  . Alcohol use: No  . Drug use: No  . Sexual activity: Not on file  Other Topics Concern  . Not on file  Social History Narrative  . Not on file   Social Determinants of Health   Financial Resource Strain:   . Difficulty of Paying Living Expenses:   Food Insecurity:   . Worried About Charity fundraiser in the Last Year:   . Arboriculturist in the Last Year:   Transportation Needs:   . Film/video editor (Medical):   Marland Kitchen Lack of Transportation (Non-Medical):   Physical Activity:   . Days of Exercise per Week:   . Minutes of Exercise per Session:   Stress:   . Feeling of Stress :   Social Connections:   . Frequency of Communication with Friends and Family:   . Frequency of Social Gatherings with Friends and Family:   . Attends Religious Services:   . Active Member of Clubs or Organizations:   . Attends Archivist Meetings:   Marland Kitchen Marital Status:   Intimate Partner Violence:   . Fear of Current or Ex-Partner:   . Emotionally  Abused:   Marland Kitchen Physically Abused:   . Sexually Abused:    Physical exam:  Vitals:   05/07/19 1355 05/07/19 1357  BP: 135/69 (!) 144/69  Pulse: (!) 50   Resp: 18   Temp: 97.7 F (36.5 C)   SpO2: 100%   Weight: 153 lb (69.4 kg)   Height: 5\' 2"  (1.575 m)     Neuro: Symmetric upper extremity lower extremity motor strength 5/5 no facial asymmetry  Extremities: 2+ dorsalis pedis pulses radial pulses bilaterally  Data:  Patient had a carotid duplex exam today which showed no significant carotid stenosis bilaterally  Assessment: Doing well status post left carotid endarterectomy no significant recurrent stenosis  Plan: The patient will have a follow-up carotid duplex exam and see one of our PAs in 2 years time.  She will continue her aspirin and statin.  Ruta Hinds, MD [facility] Office: [phone]

## 2019-05-08 ENCOUNTER — Other Ambulatory Visit: Payer: Self-pay | Admitting: Internal Medicine

## 2019-05-08 ENCOUNTER — Other Ambulatory Visit: Payer: Self-pay | Admitting: *Deleted

## 2019-05-08 DIAGNOSIS — E039 Hypothyroidism, unspecified: Secondary | ICD-10-CM

## 2019-05-08 DIAGNOSIS — I6523 Occlusion and stenosis of bilateral carotid arteries: Secondary | ICD-10-CM

## 2019-06-18 DIAGNOSIS — Z0120 Encounter for dental examination and cleaning without abnormal findings: Secondary | ICD-10-CM | POA: Diagnosis not present

## 2019-06-19 ENCOUNTER — Telehealth (INDEPENDENT_AMBULATORY_CARE_PROVIDER_SITE_OTHER): Payer: Medicare Other | Admitting: Internal Medicine

## 2019-06-19 DIAGNOSIS — E039 Hypothyroidism, unspecified: Secondary | ICD-10-CM

## 2019-06-19 DIAGNOSIS — E119 Type 2 diabetes mellitus without complications: Secondary | ICD-10-CM | POA: Diagnosis not present

## 2019-06-19 DIAGNOSIS — I10 Essential (primary) hypertension: Secondary | ICD-10-CM

## 2019-06-19 DIAGNOSIS — F411 Generalized anxiety disorder: Secondary | ICD-10-CM

## 2019-06-19 MED ORDER — HYDRALAZINE HCL 50 MG PO TABS: 50.0000 mg | ORAL_TABLET | Freq: Three times a day (TID) | ORAL | 11 refills | Status: DC

## 2019-06-19 NOTE — Progress Notes (Signed)
Patient ID: Heather Dawson, female   DOB: [birth year], 64 y.o.   MRN: [id number]  Virtual Visit via Video Note  I connected with Heather Dawson on 06/19/19 at 10:00 AM EDT by a video enabled telemedicine application and verified that I am speaking with the correct person using two identifiers.  Location: Patient: at home Provider: at office   I discussed the limitations of evaluation and management by telemedicine and the availability of in person appointments. The patient expressed understanding and agreed to proceed.  History of Present Illness: Here with c/o worsening BP over the past wk with every day sbp in the 150's-160s, with persistent HR in the 50's as well.  Had recent significant wt loss and recently stopped her BP meds but now restarted lisinopril 20 and amlodipine 10 but still elevated.  Has new BP machine at home, BP seems at bit higher in the am, then lower somewhat in the pm.  Pt denies chest pain, increased sob or doe, wheezing, orthopnea, PND, increased LE swelling, palpitations, dizziness or syncope.  Pt denies new neurological symptoms such as new headache, or facial or extremity weakness or numbness   Pt denies polydipsia, polyuria, Denies hyper or hypo thyroid symptoms such as voice, skin or hair change.  Denies worsening depressive symptoms, suicidal ideation, or panic Past Medical History:  Diagnosis Date  . Abdominal pain, epigastric 12/20/2006  . ALLERGIC RHINITIS 12/25/2006  . Allergy   . ANXIETY 10/16/2006   no per pt  . Arthritis    right shoulder  . BACK PAIN 06/15/2008  . Blood transfusion without reported diagnosis   . BRADYCARDIA 05/04/2008  . C. difficile diarrhea   . Carpal tunnel syndrome 10/16/2006  . Cellulitis and abscess of other specified site 06/15/2008  . CHEST PAIN 09/08/2009  . Chronic low back pain   . CIRRHOSIS 10/16/2006  . CKD (chronic kidney disease)    2008- had Ecoli and caused renal failure, no problems since then  . CKD (chronic kidney disease)  stage 3, GFR 30-59 ml/min 10/16/2006   Qualifier: Diagnosis of  By: Jenny Reichmann MD, [place] 12/20/2006  . Complication of anesthesia    awake during 2 surgeries  . CONTUSION, LOWER LEG 09/15/2008  . DEPRESSION 10/16/2006   no per pt  . Diabetes mellitus without complication ([place])   . Esophageal reflux 12/25/2006  . HYPERLIPIDEMIA 12/25/2006  . Hyperlipidemia   . HYPERTENSION 08/15/2006  . Hypertension   . HYPOTHYROIDISM 08/15/2006  . Kidney failure, acute ([place]) 2009   as a result of severe E Coli infection  . Left-sided carotid artery disease ([place])   . MIGRAINE HEADACHE 10/16/2006  . Obesity 10/16/2006  . OVARIAN CYST 12/20/2006  . Psoriasis 01/03/2011  . SHINGLES, HX OF 12/20/2006  . Stroke [facility])    3 strokes 2008-no deficits, only on ASA  . TRANSIENT ISCHEMIC ATTACKS, HX OF 12/20/2006  . VERTIGO 09/15/2007  . VITAMIN B12 DEFICIENCY 01/09/2007   Past Surgical History:  Procedure Laterality Date  . ABDOMINAL HYSTERECTOMY  02/2006  . ANTERIOR CERVICAL DECOMP/DISCECTOMY FUSION  2005  . CESAREAN SECTION    . CHOLECYSTECTOMY    . COLONOSCOPY    . ENDARTERECTOMY Left 08/25/2014   Procedure: LEFT CAROTID ENDARTERECTOMY WITH HEMASHIELD PATCH ANGIOPLASTY;  Surgeon: Elam Dutch, MD;  Location: [place];  Service: Vascular;  Laterality: Left;  . NECK SURGERY  2005  . ROTATOR CUFF REPAIR Right 02/2014   Dr Veverly Fells  reports that she has quit smoking. She has never used smokeless tobacco. She reports that she does not drink alcohol or use drugs. family history includes Diabetes in her father, sister, and another family member; Heart disease in her brother, brother, father, and mother; Hypertension in her brother, brother and another family member; Lupus in her mother; Raynaud syndrome in her mother; Stroke in her father. Allergies  Allergen Reactions  . Cefuroxime Axetil Swelling     edema/swelling  . Ciprofloxacin Swelling  . Levaquin [Levofloxacin In D5w] Other (See Comments)     GI upset  . Lipitor [Atorvastatin] Other (See Comments)    Myalgia   . Lyrica [Pregabalin] Other (See Comments)    HA, confusion, swelling  . Macrodantin [Nitrofurantoin Macrocrystal] Other (See Comments)    GI upset, diarrhea and sob  . Gabapentin Rash   Current Outpatient Medications on File Prior to Visit  Medication Sig Dispense Refill  . acetaminophen (TYLENOL) 500 MG tablet Take 1,000 mg by mouth every 8 (eight) hours as needed (pain).    Marland Kitchen aspirin 325 MG EC tablet Take 325 mg by mouth daily.      . Cholecalciferol (VITAMIN D3) 1000 units CAPS Take 2 capsules (2,000 Units total) by mouth daily. 60 capsule 0  . clobetasol (TEMOVATE) 0.05 % external solution Apply 1 application topically daily as needed (scalp psoriasis).   0  . diclofenac sodium (VOLTAREN) 1 % GEL Apply 4 g topically 4 (four) times daily as needed. 400 g 11  . levocetirizine (XYZAL) 5 MG tablet     . levothyroxine (SYNTHROID) 75 MCG tablet Take 1 tablet (75 mcg total) by mouth daily. 90 tablet 3  . lovastatin (MEVACOR) 40 MG tablet TAKE 1 TABLET BY MOUTH AT BEDTIME 90 tablet 3  . mupirocin cream (BACTROBAN) 2 % Apply 1 application topically 2 (two) times daily.    . SUMAtriptan (IMITREX) 100 MG tablet TAKE 1 TABLET BY MOUTH EVERY 2 HOURS AS NEEDED FOR MIGRAINE OR HEADACHE, MAY REPEAT IN 2 HOURS IF HEADACHE PERSISTS OR RECURS 10 tablet 2  . vitamin B-12 (CYANOCOBALAMIN) 1000 MCG tablet Take 1 tablet (1,000 mcg total) by mouth daily. 90 tablet 1   No current facility-administered medications on file prior to visit.    Observations/Objective: Alert, NAD, appropriate mood and affect, resps normal, cn 2-12 intact, moves all 4s, no visible rash or swelling Lab Results  Component Value Date   WBC 5.8 05/01/2019   HGB 13.3 05/01/2019   HCT 39.9 05/01/2019   PLT 209.0 05/01/2019   GLUCOSE 82 05/01/2019   CHOL 139 05/01/2019   TRIG 78.0 05/01/2019   HDL 45.60 05/01/2019   LDLDIRECT 140.0 08/05/2018   LDLCALC 78  05/01/2019   ALT 14 05/01/2019   AST 17 05/01/2019   NA 139 05/01/2019   K 4.3 05/01/2019   CL 105 05/01/2019   CREATININE 1.39 (H) 05/01/2019   BUN 24 (H) 05/01/2019   CO2 26 05/01/2019   TSH 0.04 (L) 05/01/2019   INR 0.99 08/17/2014   HGBA1C 5.5 05/01/2019   MICROALBUR 3.3 (H) 05/01/2019   Assessment and Plan: See notes  Follow Up Instructions: See notes   I discussed the assessment and treatment plan with the patient. The patient was provided an opportunity to ask questions and all were answered. The patient agreed with the plan and demonstrated an understanding of the instructions.   The patient was advised to call back or seek an in-person evaluation if the symptoms worsen or  if the condition fails to improve as anticipated.   [name], MD

## 2019-06-19 NOTE — Patient Instructions (Signed)
Please take all new medication as prescribed - the hydralazine for blood pressure  Please continue all other medications as before, and refills have been done if requested.  Please have the pharmacy call with any other refills you may need.  Please continue your efforts at being more active, low cholesterol diet, and weight control.  Please keep your appointments with your specialists as you may have planned  Please make an Appointment to return in 2 weeks, or sooner if needed, and bring your home BP cuff

## 2019-06-20 ENCOUNTER — Encounter: Payer: Self-pay | Admitting: Internal Medicine

## 2019-06-20 NOTE — Assessment & Plan Note (Signed)
stable overall by history and exam, recent data reviewed with pt, and pt to continue medical treatment as before,  to f/u any worsening symptoms or concerns  

## 2019-06-20 NOTE — Assessment & Plan Note (Signed)
stable overall by history and exam, recent data reviewed with pt, and pt to continue medical treatment as before,  to f/u any worsening symptoms or concerns le

## 2019-06-20 NOTE — Assessment & Plan Note (Addendum)
Recent uncontrolled despite restarting prior meds, ok for hydralazine 50 tid,  to f/u any worsening symptoms or concerns, rov 2 wks  I spent 31 minutes in preparing to see the patient by review of recent labs, imaging and procedures, obtaining and reviewing separately obtained history, communicating with the patient and family or caregiver, ordering medications, tests or procedures, and documenting clinical information in the EHR including the differential Dx, treatment, and any further evaluation and other management of HTN, DM, anxiety, hypohtyoridism

## 2019-06-22 ENCOUNTER — Encounter: Payer: Self-pay | Admitting: Internal Medicine

## 2019-07-03 ENCOUNTER — Other Ambulatory Visit: Payer: Self-pay

## 2019-07-03 ENCOUNTER — Ambulatory Visit (INDEPENDENT_AMBULATORY_CARE_PROVIDER_SITE_OTHER): Payer: Medicare Other | Admitting: Internal Medicine

## 2019-07-03 VITALS — BP 152/94 | HR 68 | Temp 97.5°F | Ht 62.0 in | Wt 155.0 lb

## 2019-07-03 DIAGNOSIS — E119 Type 2 diabetes mellitus without complications: Secondary | ICD-10-CM | POA: Diagnosis not present

## 2019-07-03 DIAGNOSIS — E538 Deficiency of other specified B group vitamins: Secondary | ICD-10-CM

## 2019-07-03 DIAGNOSIS — I10 Essential (primary) hypertension: Secondary | ICD-10-CM

## 2019-07-03 DIAGNOSIS — E039 Hypothyroidism, unspecified: Secondary | ICD-10-CM | POA: Diagnosis not present

## 2019-07-03 DIAGNOSIS — N183 Chronic kidney disease, stage 3 unspecified: Secondary | ICD-10-CM

## 2019-07-03 LAB — BASIC METABOLIC PANEL
BUN: 41 mg/dL — ABNORMAL HIGH (ref 6–23)
CO2: 27 mEq/L (ref 19–32)
Calcium: 9.4 mg/dL (ref 8.4–10.5)
Chloride: 103 mEq/L (ref 96–112)
Creatinine, Ser: 1.66 mg/dL — ABNORMAL HIGH (ref 0.40–1.20)
GFR: 31.24 mL/min — ABNORMAL LOW (ref >60.00)
Glucose, Bld: 96 mg/dL (ref 70–99)
Potassium: 4.9 mEq/L (ref 3.5–5.1)
Sodium: 138 mEq/L (ref 135–145)

## 2019-07-03 LAB — T4, FREE: Free T4: 0.99 ng/dL (ref 0.60–1.60)

## 2019-07-03 LAB — TSH: TSH: 3.72 u[IU]/mL (ref 0.35–4.50)

## 2019-07-03 LAB — VITAMIN B12: Vitamin B-12: >1500 pg/mL — ABNORMAL HIGH (ref 211–911)

## 2019-07-03 NOTE — Patient Instructions (Addendum)
Please continue all other medications as before, and refills have been done if requested.  Please have the pharmacy call with any other refills you may need.  Please continue your efforts at being more active, low cholesterol diet, and weight control.  Please keep your appointments with your specialists as you may have planned  Please go to the LAB at the blood drawing area for the tests to be done  You will be contacted by phone if any changes need to be made immediately.  Otherwise, you will receive a letter about your results with an explanation, but please check with MyChart first.  Please remember to sign up for MyChart if you have not done so, as this will be important to you in the future with finding out test results, communicating by private email, and scheduling acute appointments online when needed.  Please make an Appointment to return in October 2021, or sooner if needed

## 2019-07-03 NOTE — Progress Notes (Signed)
Subjective:    Patient ID: Heather Dawson, female    DOB: 1955-05-03, 64 y.o.   MRN: [id number]  HPI Here to f/u BP after add new hydralazine last visit; Pt denies chest pain, increasing sob or doe, wheezing, orthopnea, PND, increased LE swelling, palpitations, dizziness or syncope.  Pt denies new neurological symptoms such as new headache, or facial or extremity weakness or numbness.  Pt denies polydipsia, polyuria, or low sugar episode.  Pt states overall good compliance with meds, mostly trying to follow appropriate diet, with wt overall stable,  bP at home have been ave about 130/80, and BP today with her cuff is similar to the office BP as well. Pt believes no significant wt loss despite our initial wt of 147 as she if mostly 155 at home BP Readings from Last 3 Encounters:  07/03/19 (!) 152/94  05/07/19 (!) 144/69  05/01/19 134/84   Wt Readings from Last 3 Encounters:  07/03/19 147 lb (66.7 kg)  05/07/19 153 lb (69.4 kg)  05/01/19 156 lb (70.8 kg)  Due for f/u tft's and b12. Plans to get COVID vaccination #1 at [facility] on way home today  Denies hyper or hypo thyroid symptoms such as voice, skin or hair change. Past Medical History:  Diagnosis Date  . Abdominal pain, epigastric 12/20/2006  . ALLERGIC RHINITIS 12/25/2006  . Allergy   . ANXIETY 10/16/2006   no per pt  . Arthritis    right shoulder  . BACK PAIN 06/15/2008  . Blood transfusion without reported diagnosis   . BRADYCARDIA 05/04/2008  . C. difficile diarrhea   . Carpal tunnel syndrome 10/16/2006  . Cellulitis and abscess of other specified site 06/15/2008  . CHEST PAIN 09/08/2009  . Chronic low back pain   . CIRRHOSIS 10/16/2006  . CKD (chronic kidney disease)    2008- had Ecoli and caused renal failure, no problems since then  . CKD (chronic kidney disease) stage 3, GFR 30-59 ml/min 10/16/2006   Qualifier: Diagnosis of  By: Jenny Reichmann MD, [place] 12/20/2006  . Complication of anesthesia    awake during 2  surgeries  . CONTUSION, LOWER LEG 09/15/2008  . DEPRESSION 10/16/2006   no per pt  . Diabetes mellitus without complication ([place])   . Esophageal reflux 12/25/2006  . HYPERLIPIDEMIA 12/25/2006  . Hyperlipidemia   . HYPERTENSION 08/15/2006  . Hypertension   . HYPOTHYROIDISM 08/15/2006  . Kidney failure, acute ([place]) 2009   as a result of severe E Coli infection  . Left-sided carotid artery disease ([place])   . MIGRAINE HEADACHE 10/16/2006  . Obesity 10/16/2006  . OVARIAN CYST 12/20/2006  . Psoriasis 01/03/2011  . SHINGLES, HX OF 12/20/2006  . Stroke [facility])    3 strokes 2008-no deficits, only on ASA  . TRANSIENT ISCHEMIC ATTACKS, HX OF 12/20/2006  . VERTIGO 09/15/2007  . VITAMIN B12 DEFICIENCY 01/09/2007   Past Surgical History:  Procedure Laterality Date  . ABDOMINAL HYSTERECTOMY  02/2006  . ANTERIOR CERVICAL DECOMP/DISCECTOMY FUSION  2005  . CESAREAN SECTION    . CHOLECYSTECTOMY    . COLONOSCOPY    . ENDARTERECTOMY Left 08/25/2014   Procedure: LEFT CAROTID ENDARTERECTOMY WITH HEMASHIELD PATCH ANGIOPLASTY;  Surgeon: Elam Dutch, MD;  Location: [place];  Service: Vascular;  Laterality: Left;  . NECK SURGERY  2005  . ROTATOR CUFF REPAIR Right 02/2014   Dr Veverly Fells    reports that she has quit smoking. She has never used smokeless tobacco.  She reports that she does not drink alcohol or use drugs. family history includes Diabetes in her father, sister, and another family member; Heart disease in her brother, brother, father, and mother; Hypertension in her brother, brother and another family member; Lupus in her mother; Raynaud syndrome in her mother; Stroke in her father. Allergies  Allergen Reactions  . Cefuroxime Axetil Swelling     edema/swelling  . Ciprofloxacin Swelling  . Levaquin [Levofloxacin In D5w] Other (See Comments)    GI upset  . Lipitor [Atorvastatin] Other (See Comments)    Myalgia   . Lyrica [Pregabalin] Other (See Comments)    HA, confusion, swelling  . Macrodantin  [Nitrofurantoin Macrocrystal] Other (See Comments)    GI upset, diarrhea and sob  . Gabapentin Rash   Current Outpatient Medications on File Prior to Visit  Medication Sig Dispense Refill  . acetaminophen (TYLENOL) 500 MG tablet Take 1,000 mg by mouth every 8 (eight) hours as needed (pain).    Marland Kitchen aspirin 325 MG EC tablet Take 325 mg by mouth daily.      . calcium carbonate (OSCAL) 1500 (600 Ca) MG TABS tablet calcium carbonate 600 mg calcium (1,500 mg) tablet    . Cholecalciferol (VITAMIN D3) 1000 units CAPS Take 2 capsules (2,000 Units total) by mouth daily. 60 capsule 0  . clobetasol (TEMOVATE) 0.05 % external solution Apply 1 application topically daily as needed (scalp psoriasis).   0  . diclofenac sodium (VOLTAREN) 1 % GEL Apply 4 g topically 4 (four) times daily as needed. 400 g 11  . hydrALAZINE (APRESOLINE) 50 MG tablet Take 1 tablet (50 mg total) by mouth 3 (three) times daily. 90 tablet 11  . levocetirizine (XYZAL) 5 MG tablet     . levothyroxine (SYNTHROID) 75 MCG tablet Take 1 tablet (75 mcg total) by mouth daily. 90 tablet 3  . lovastatin (MEVACOR) 40 MG tablet TAKE 1 TABLET BY MOUTH AT BEDTIME 90 tablet 3  . mupirocin cream (BACTROBAN) 2 % Apply 1 application topically 2 (two) times daily.    . SUMAtriptan (IMITREX) 100 MG tablet TAKE 1 TABLET BY MOUTH EVERY 2 HOURS AS NEEDED FOR MIGRAINE OR HEADACHE, MAY REPEAT IN 2 HOURS IF HEADACHE PERSISTS OR RECURS 10 tablet 2  . triamcinolone cream (KENALOG) 0.1 % SMARTSIG:1 Application Topical 2-3 Times Daily    . vitamin B-12 (CYANOCOBALAMIN) 1000 MCG tablet Take 1 tablet (1,000 mcg total) by mouth daily. 90 tablet 1   No current facility-administered medications on file prior to visit.   Review of Systems All otherwise neg per pt     Objective:   Physical Exam BP (!) 152/94 (BP Location: Left Arm, Patient Position: Sitting, Cuff Size: Large)   Pulse 68   Temp (!) 97.5 F (36.4 C) (Oral)   Ht 5\' 2"  (1.575 m)   Wt 155 lb (70.3  kg)   SpO2 99%   BMI 28.35 kg/m  VS noted,  Constitutional: Pt appears in NAD HENT: Head: NCAT.  Right Ear: External ear normal.  Left Ear: External ear normal.  Eyes: . Pupils are equal, round, and reactive to light. Conjunctivae and EOM are normal Nose: without d/c or deformity Neck: Neck supple. Gross normal ROM Cardiovascular: Normal rate and regular rhythm.   Pulmonary/Chest: Effort normal and breath sounds without rales or wheezing.  Abd:  Soft, NT, ND, + BS, no organomegaly Neurological: Pt is alert. At baseline orientation, motor grossly intact Skin: Skin is warm. No rashes, other new lesions, no  LE edema Psychiatric: Pt behavior is normal without agitation  All otherwise neg per pt Lab Results  Component Value Date   WBC 5.8 05/01/2019   HGB 13.3 05/01/2019   HCT 39.9 05/01/2019   PLT 209.0 05/01/2019   GLUCOSE 96 07/03/2019   CHOL 139 05/01/2019   TRIG 78.0 05/01/2019   HDL 45.60 05/01/2019   LDLDIRECT 140.0 08/05/2018   LDLCALC 78 05/01/2019   ALT 14 05/01/2019   AST 17 05/01/2019   NA 138 07/03/2019   K 4.9 07/03/2019   CL 103 07/03/2019   CREATININE 1.66 (H) 07/03/2019   BUN 41 (H) 07/03/2019   CO2 27 07/03/2019   TSH 3.72 07/03/2019   INR 0.99 08/17/2014   HGBA1C 5.5 05/01/2019   MICROALBUR 3.3 (H) 05/01/2019      Assessment & Plan:

## 2019-07-04 ENCOUNTER — Encounter: Payer: Self-pay | Admitting: Internal Medicine

## 2019-07-04 NOTE — Assessment & Plan Note (Addendum)

## 2019-07-04 NOTE — Assessment & Plan Note (Signed)
For contd replacement, check level

## 2019-07-04 NOTE — Assessment & Plan Note (Signed)
stable overall by history and exam, recent data reviewed with pt, and pt to continue medical treatment as before,  to f/u any worsening symptoms or concerns  

## 2019-07-04 NOTE — Assessment & Plan Note (Signed)
Cont f/u with renal, stable overall by history and exam, recent data reviewed with pt, and pt to continue medical treatment as before,  to f/u any worsening symptoms or concerns

## 2019-07-27 ENCOUNTER — Telehealth: Payer: Self-pay

## 2019-07-27 NOTE — Telephone Encounter (Signed)
1.Medication Requested:   Lisinopril  20 mg   2. Pharmacy (Name, Street, [place]):  [facility] [address] [place]  3. On Med List: Yes   4. Last Visit with PCP: 4.30.21   5. Next visit date with PCP: n/a   Agent: Please be advised that RX refills may take up to 3 business days. We ask that you follow-up with your pharmacy.

## 2019-07-30 MED ORDER — LISINOPRIL 20 MG PO TABS
20.0000 mg | ORAL_TABLET | Freq: Every day | ORAL | 3 refills | Status: DC
Start: 2019-07-30 — End: 2019-08-06

## 2019-07-30 NOTE — Telephone Encounter (Signed)
I think this appears to be a mistake where the lisinopril may have been mistakenly taken off our med list.   The record indicates an active rx up until mar 2021, but does not indicate a reason for d/c.  Please confirm with pt that she is taking, and if so, ok for routine refill. thanks

## 2019-07-30 NOTE — Addendum Note (Signed)
Addended by: Marijean Heath R on: 07/30/2019 03:59 PM   Modules accepted: Orders

## 2019-08-06 ENCOUNTER — Other Ambulatory Visit: Payer: Self-pay

## 2019-08-06 MED ORDER — LISINOPRIL 20 MG PO TABS: 20.0000 mg | ORAL_TABLET | Freq: Every day | ORAL | 3 refills | Status: DC

## 2019-09-02 ENCOUNTER — Telehealth: Payer: Self-pay | Admitting: Internal Medicine

## 2019-09-02 ENCOUNTER — Telehealth: Payer: Self-pay

## 2019-09-02 NOTE — Telephone Encounter (Signed)
New message    The patient had spoken with the [facility] Triage nurse they advise her to call the office back and speak with Dr. Jenny Reichmann on the next step.

## 2019-09-02 NOTE — Telephone Encounter (Signed)
Agree with covid testing  If symptoms worsening, should go to UC or ED

## 2019-09-02 NOTE — Telephone Encounter (Signed)
New message:   Pt is calling and states she has extreme fatigue and has a cough since last week. Pt asked to be transferred to a nurse to see if she needs to go get tested for Covid. I have transferred to call to [facility]. Please advise.

## 2019-09-02 NOTE — Telephone Encounter (Signed)
Please see previous phone encounter and advise. Thanks.

## 2019-09-02 NOTE — Telephone Encounter (Signed)
Pt has been informed. She states that she is feeling better but will proceed with checking with her pharmacy to see if they offer covid testing.

## 2019-09-07 ENCOUNTER — Telehealth: Payer: Self-pay

## 2019-09-07 NOTE — Telephone Encounter (Signed)
1.Medication Requested: amlodipine  10 mg   2. Pharmacy (Name, Street, [place]):[facility] [address] [place]  3. On Med List: no   4. Last Visit with PCP: 5.14.21  5. Next visit date with PCP: n/a   Agent: Please be advised that RX refills may take up to 3 business days. We ask that you follow-up with your pharmacy.

## 2019-09-08 ENCOUNTER — Other Ambulatory Visit: Payer: Self-pay

## 2019-09-08 NOTE — Telephone Encounter (Signed)
Ok to call pt to see if she is taking this, but I suspect she is not, and this refill may not be needed

## 2019-09-08 NOTE — Telephone Encounter (Signed)
Sent to Dr. Jenny Reichmann to advise as medication is not on Pt current med list.

## 2019-09-09 NOTE — Telephone Encounter (Signed)
Called pt, LVM.   

## 2019-09-14 ENCOUNTER — Telehealth: Payer: Self-pay | Admitting: Internal Medicine

## 2019-09-14 NOTE — Telephone Encounter (Signed)
New Message:   1.Medication Requested: Amlodipine 10 mg 2. Pharmacy (Name, Street, [place]): [facility] [address] [place] 3. On Med List:  No  4. Last Visit with PCP: 07/03/19  5. Next visit date with PCP:  Pt states that [facility] has sent over 2 request for a refill of this medication. Please advise.   Agent: Please be advised that RX refills may take up to 3 business days. We ask that you follow-up with your pharmacy.

## 2019-09-14 NOTE — Telephone Encounter (Signed)
Pt not currently taking this med

## 2019-09-18 NOTE — Telephone Encounter (Signed)
Heather Dawson [facility]) [place], [place] Phone:  [phone]  Fax:  [phone]     Patient is following up on refill of amlodipine 10mg , does not remember the Dr. Jenny Reichmann taking her off this medication.  Please advise the patient

## 2019-09-29 ENCOUNTER — Encounter: Payer: Self-pay | Admitting: Internal Medicine

## 2019-09-29 ENCOUNTER — Ambulatory Visit (INDEPENDENT_AMBULATORY_CARE_PROVIDER_SITE_OTHER): Payer: Medicare Other

## 2019-09-29 ENCOUNTER — Other Ambulatory Visit: Payer: Self-pay

## 2019-09-29 ENCOUNTER — Other Ambulatory Visit: Payer: Self-pay | Admitting: Internal Medicine

## 2019-09-29 ENCOUNTER — Ambulatory Visit (INDEPENDENT_AMBULATORY_CARE_PROVIDER_SITE_OTHER): Payer: Medicare Other | Admitting: Internal Medicine

## 2019-09-29 VITALS — BP 132/90 | HR 53 | Temp 98.3°F | Ht 62.0 in | Wt 161.0 lb

## 2019-09-29 DIAGNOSIS — E119 Type 2 diabetes mellitus without complications: Secondary | ICD-10-CM

## 2019-09-29 DIAGNOSIS — E538 Deficiency of other specified B group vitamins: Secondary | ICD-10-CM

## 2019-09-29 DIAGNOSIS — E559 Vitamin D deficiency, unspecified: Secondary | ICD-10-CM | POA: Diagnosis not present

## 2019-09-29 DIAGNOSIS — R1013 Epigastric pain: Secondary | ICD-10-CM

## 2019-09-29 DIAGNOSIS — R1032 Left lower quadrant pain: Secondary | ICD-10-CM | POA: Diagnosis not present

## 2019-09-29 DIAGNOSIS — N390 Urinary tract infection, site not specified: Secondary | ICD-10-CM | POA: Diagnosis not present

## 2019-09-29 DIAGNOSIS — R5383 Other fatigue: Secondary | ICD-10-CM | POA: Insufficient documentation

## 2019-09-29 DIAGNOSIS — R1030 Lower abdominal pain, unspecified: Secondary | ICD-10-CM | POA: Diagnosis not present

## 2019-09-29 DIAGNOSIS — E785 Hyperlipidemia, unspecified: Secondary | ICD-10-CM

## 2019-09-29 DIAGNOSIS — E611 Iron deficiency: Secondary | ICD-10-CM

## 2019-09-29 DIAGNOSIS — Z0000 Encounter for general adult medical examination without abnormal findings: Secondary | ICD-10-CM

## 2019-09-29 MED ORDER — PANTOPRAZOLE SODIUM 40 MG PO TBEC: 40.0000 mg | DELAYED_RELEASE_TABLET | Freq: Every day | ORAL | 3 refills | Status: DC

## 2019-09-29 NOTE — Patient Instructions (Signed)
Please take all new medication as prescribed - the protonix for the stomach  Please continue all other medications as before, and refills have been done if requested.  Please have the pharmacy call with any other refills you may need.  Please continue your efforts at being more active, low cholesterol diet, and weight control.  You are otherwise up to date with prevention measures today.  Please keep your appointments with your specialists as you may have planned - Renal  You will be contacted regarding the referral for: abdomen ultrasound, and Sports Medicine  Please go to the XRAY Department in the first floor for the x-ray testing  Please go to the LAB at the blood drawing area for the tests to be done  You will be contacted by phone if any changes need to be made immediately.  Otherwise, you will receive a letter about your results with an explanation, but please check with MyChart first.  Please remember to sign up for MyChart if you have not done so, as this will be important to you in the future with finding out test results, communicating by private email, and scheduling acute appointments online when needed.  We'll see you at your next visit in September but no blood work should be needed prior to that visit  If not improving, at some point we may need to consider referral to Dr Henrene Pastor and/or CT scan for the abdominal/pelvis

## 2019-09-29 NOTE — Progress Notes (Signed)
Subjective:    Patient ID: Heather Dawson, female    DOB: 12-17-1955, 64 y.o.   MRN: [id number]  HPI  Here with 3-4 wks of worsening fatigue, recent covid neg x 3 wks ago and no known exposure since, and no overt uri or GI or other related symptoms, though has had some ongoing intermittent epigastric pain; overall fatigue seemed to start with a 3 day stretch in bed at 3 wks ago, better for a couple of days, then spent 4 days in bed, and since then some improved but only for the morning, then generally has to lie down after noon again;  Has no other pain except for 1 mo left groin pain, with some radiation to the left buttock area and the left upper leg to the knee (and one day with shooting pain to the left upper distal leg as well), moderate, intermittent, worse to flex the hip, sometimes has to sit to get better but even that seems to make it worse with sitting for a longer period of time; Was walking 5- miles per day over 1 mo ago at 6 days per wk, now down to 1-2 walks week down to 4 miles each time   Has regained some 10 lbs with less activity, and no change in diet.   Does not want MRI due to cost and does not want to see ortho.  No falls.  Also has some intermittent pain to the coccyx as well as if she had fallen (but didn't) even hurts to touch. No rash or swelling.  Has not taken PPI for many months  Pt denies chest pain, increased sob or doe, wheezing, orthopnea, PND, increased LE swelling, palpitations, dizziness or syncope.  Pt denies polydipsia, polyuria.  .     Past Medical History:  Diagnosis Date  . Abdominal pain, epigastric 12/20/2006  . ALLERGIC RHINITIS 12/25/2006  . Allergy   . ANXIETY 10/16/2006   no per pt  . Arthritis    right shoulder  . BACK PAIN 06/15/2008  . Blood transfusion without reported diagnosis   . BRADYCARDIA 05/04/2008  . C. difficile diarrhea   . Carpal tunnel syndrome 10/16/2006  . Cellulitis and abscess of other specified site 06/15/2008  . CHEST PAIN 09/08/2009   . Chronic low back pain   . CIRRHOSIS 10/16/2006  . CKD (chronic kidney disease)    2008- had Ecoli and caused renal failure, no problems since then  . CKD (chronic kidney disease) stage 3, GFR 30-59 ml/min 10/16/2006   Qualifier: Diagnosis of  By: Jenny Reichmann MD, [place] 12/20/2006  . Complication of anesthesia    awake during 2 surgeries  . CONTUSION, LOWER LEG 09/15/2008  . DEPRESSION 10/16/2006   no per pt  . Diabetes mellitus without complication ([place])   . Esophageal reflux 12/25/2006  . HYPERLIPIDEMIA 12/25/2006  . Hyperlipidemia   . HYPERTENSION 08/15/2006  . Hypertension   . HYPOTHYROIDISM 08/15/2006  . Kidney failure, acute ([place]) 2009   as a result of severe E Coli infection  . Left-sided carotid artery disease ([place])   . MIGRAINE HEADACHE 10/16/2006  . Obesity 10/16/2006  . OVARIAN CYST 12/20/2006  . Psoriasis 01/03/2011  . SHINGLES, HX OF 12/20/2006  . Stroke [facility])    3 strokes 2008-no deficits, only on ASA  . TRANSIENT ISCHEMIC ATTACKS, HX OF 12/20/2006  . VERTIGO 09/15/2007  . VITAMIN B12 DEFICIENCY 01/09/2007   Past Surgical History:  Procedure Laterality Date  .  ABDOMINAL HYSTERECTOMY  02/2006  . ANTERIOR CERVICAL DECOMP/DISCECTOMY FUSION  2005  . CESAREAN SECTION    . CHOLECYSTECTOMY    . COLONOSCOPY    . ENDARTERECTOMY Left 08/25/2014   Procedure: LEFT CAROTID ENDARTERECTOMY WITH HEMASHIELD PATCH ANGIOPLASTY;  Surgeon: Elam Dutch, MD;  Location: [place];  Service: Vascular;  Laterality: Left;  . NECK SURGERY  2005  . ROTATOR CUFF REPAIR Right 02/2014   Dr Veverly Fells    reports that she has quit smoking. She has never used smokeless tobacco. She reports that she does not drink alcohol and does not use drugs. family history includes Diabetes in her father, sister, and another family member; Heart disease in her brother, brother, father, and mother; Hypertension in her brother, brother and another family member; Lupus in her mother; Raynaud syndrome in her  mother; Stroke in her father. Allergies  Allergen Reactions  . Cefuroxime Axetil Swelling     edema/swelling  . Ciprofloxacin Swelling  . Levaquin [Levofloxacin In D5w] Other (See Comments)    GI upset  . Lipitor [Atorvastatin] Other (See Comments)    Myalgia   . Lyrica [Pregabalin] Other (See Comments)    HA, confusion, swelling  . Macrodantin [Nitrofurantoin Macrocrystal] Other (See Comments)    GI upset, diarrhea and sob  . Gabapentin Rash   Current Outpatient Medications on File Prior to Visit  Medication Sig Dispense Refill  . acetaminophen (TYLENOL) 500 MG tablet Take 1,000 mg by mouth every 8 (eight) hours as needed (pain).    Marland Kitchen aspirin 325 MG EC tablet Take 325 mg by mouth daily.      . calcium carbonate (OSCAL) 1500 (600 Ca) MG TABS tablet calcium carbonate 600 mg calcium (1,500 mg) tablet    . Cholecalciferol (VITAMIN D3) 1000 units CAPS Take 2 capsules (2,000 Units total) by mouth daily. 60 capsule 0  . clobetasol (TEMOVATE) 0.05 % external solution Apply 1 application topically daily as needed (scalp psoriasis).   0  . diclofenac sodium (VOLTAREN) 1 % GEL Apply 4 g topically 4 (four) times daily as needed. 400 g 11  . hydrALAZINE (APRESOLINE) 50 MG tablet Take 1 tablet (50 mg total) by mouth 3 (three) times daily. 90 tablet 11  . ID NOW COVID-19 KIT See admin instructions.    Marland Kitchen levocetirizine (XYZAL) 5 MG tablet     . levothyroxine (SYNTHROID) 75 MCG tablet Take 1 tablet (75 mcg total) by mouth daily. 90 tablet 3  . lisinopril (ZESTRIL) 20 MG tablet Take 1 tablet (20 mg total) by mouth daily. 90 tablet 3  . lovastatin (MEVACOR) 40 MG tablet TAKE 1 TABLET BY MOUTH AT BEDTIME 90 tablet 3  . mupirocin cream (BACTROBAN) 2 % Apply 1 application topically 2 (two) times daily.    . SUMAtriptan (IMITREX) 100 MG tablet TAKE 1 TABLET BY MOUTH EVERY 2 HOURS AS NEEDED FOR MIGRAINE OR HEADACHE, MAY REPEAT IN 2 HOURS IF HEADACHE PERSISTS OR RECURS 10 tablet 2  . triamcinolone cream  (KENALOG) 0.1 % SMARTSIG:1 Application Topical 2-3 Times Daily    . vitamin B-12 (CYANOCOBALAMIN) 1000 MCG tablet Take 1 tablet (1,000 mcg total) by mouth daily. 90 tablet 1   No current facility-administered medications on file prior to visit.   Review of Systems All otherwise neg per pt     Objective:   Physical Exam BP 132/90 (BP Location: Left Arm, Patient Position: Sitting, Cuff Size: Large)   Pulse (!) 53   Temp 98.3 F (36.8 C) (Oral)  Ht 5' 2"  (1.575 m)   Wt 161 lb (73 kg)   SpO2 98%   BMI 29.45 kg/m  VS noted,  Constitutional: Pt appears in NAD HENT: Head: NCAT.  Right Ear: External ear normal.  Left Ear: External ear normal.  Eyes: . Pupils are equal, round, and reactive to light. Conjunctivae and EOM are normal Nose: without d/c or deformity Neck: Neck supple. Gross normal ROM Cardiovascular: Normal rate and regular rhythm.   Pulmonary/Chest: Effort normal and breath sounds without rales or wheezing.  Abd:  Soft, mild tender epigasric, ND, + BS, no organomegaly Left groin without swelling, tender or mass; may have mild pain to left hip flexion Neurological: Pt is alert. At baseline orientation, motor grossly intact Skin: Skin is warm. No rashes, other new lesions, no LE edema Psychiatric: Pt behavior is normal without agitation  All otherwise neg per pt Lab Results  Component Value Date   WBC 5.7 09/29/2019   HGB 12.0 09/29/2019   HCT 36.8 09/29/2019   PLT 200 09/29/2019   GLUCOSE 96 07/03/2019   CHOL 154 09/29/2019   TRIG 78 09/29/2019   HDL 55 09/29/2019   LDLDIRECT 140.0 08/05/2018   LDLCALC 83 09/29/2019   ALT 15 09/29/2019   AST 17 09/29/2019   NA 138 07/03/2019   K 4.9 07/03/2019   CL 103 07/03/2019   CREATININE 1.66 (H) 07/03/2019   BUN 41 (H) 07/03/2019   CO2 27 07/03/2019   TSH 4.28 09/29/2019   INR 0.99 08/17/2014   HGBA1C 5.2 09/29/2019   MICROALBUR 3.3 (H) 05/01/2019      Assessment & Plan:

## 2019-09-30 ENCOUNTER — Encounter: Payer: Self-pay | Admitting: Internal Medicine

## 2019-09-30 ENCOUNTER — Ambulatory Visit
Admission: RE | Admit: 2019-09-30 | Discharge: 2019-09-30 | Disposition: A | Discharge status: Home / Self Care | Payer: Medicare Other | Source: Ambulatory Visit | Attending: Internal Medicine | Admitting: Internal Medicine

## 2019-09-30 DIAGNOSIS — R1013 Epigastric pain: Secondary | ICD-10-CM

## 2019-09-30 DIAGNOSIS — N281 Cyst of kidney, acquired: Secondary | ICD-10-CM | POA: Diagnosis not present

## 2019-09-30 DIAGNOSIS — K7689 Other specified diseases of liver: Secondary | ICD-10-CM | POA: Diagnosis not present

## 2019-09-30 DIAGNOSIS — N261 Atrophy of kidney (terminal): Secondary | ICD-10-CM | POA: Diagnosis not present

## 2019-09-30 LAB — FERRITIN: Ferritin: 48 ng/mL (ref 16–288)

## 2019-09-30 LAB — TRANSFERRIN: Transferrin: 287 mg/dL (ref 188–341)

## 2019-09-30 LAB — H. PYLORI ANTIBODY, IGG: H. pylori, IgG AbS: 0.4 Index Value (ref 0.00–0.79)

## 2019-10-01 ENCOUNTER — Other Ambulatory Visit: Payer: Self-pay

## 2019-10-01 ENCOUNTER — Telehealth: Payer: Self-pay

## 2019-10-01 ENCOUNTER — Encounter: Payer: Self-pay | Admitting: Family Medicine

## 2019-10-01 ENCOUNTER — Ambulatory Visit: Payer: Medicare Other | Admitting: Family Medicine

## 2019-10-01 ENCOUNTER — Ambulatory Visit: Payer: Self-pay

## 2019-10-01 VITALS — BP 130/84 | HR 57 | Ht 62.0 in | Wt 161.0 lb

## 2019-10-01 DIAGNOSIS — M25552 Pain in left hip: Secondary | ICD-10-CM | POA: Diagnosis not present

## 2019-10-01 MED ORDER — TRAMADOL HCL 50 MG PO TABS: 50.0000 mg | ORAL_TABLET | Freq: Three times a day (TID) | ORAL | 0 refills | Status: DC | PRN

## 2019-10-01 NOTE — Patient Instructions (Addendum)
Thank you for coming in today.  Plan for PT.  Use tramadol for severe pain.  Tramadol is ok to take with tylenol .  Recheck in 2-4 weeks.   I will request MRI reports from Dr Rolena Infante.   If this is not working let me know.

## 2019-10-01 NOTE — Telephone Encounter (Signed)
Patient called stating that her first PT appointment is not till Sep 1st which is fine but her follow up with you is on Sep 2nd she wants to know should she keep that appointment or should she push it back till she has been to PT a few times?

## 2019-10-01 NOTE — Progress Notes (Signed)
Subjective:    CC: L hip/groin pain  I, Heather Dawson, LAT, ATC, am serving as scribe for Dr. Lynne Leader.  HPI: Pt is a 64 y/o female presenting w/ c/o L hip/groin pain that radiates to her L glute and thigh. Patient states that she was walking 5 miles 6 days a week when she noticed the pain starting. Pain has been going on for a month, patient has stopped walking to help alleviate the pain, but is still very uncomfortable.  She denies any injury or increased exercise causing her pain.  She lost 50 pounds by exercising and would like to continue to exercise if possible.  Radiating pain: To glute and thigh down l leg Weakness: in Left leg feels lik it is going to give way Back pain: no not from this pain Aggravating factors: hip flexion; prolonged sitting; getting up in the am Treatments tried:tylenol  Diagnostic testing: Pelvis and abdominal XR- 09/29/19; abdominal US- 09/30/19  Pertinent review of Systems: No fevers or chills  Relevant historical information: Carotid artery disease   Objective:    Vitals:   10/01/19 0835  BP: 130/84  Pulse: (!) 57  SpO2: 98%   General: Well Developed, well nourished, and in no acute distress.   MSK: Left hip normal-appearing Range of motion limited flexion with pain.  Normal external and motion.  Internal rotation motion produces pain and is somewhat limited. Decreased hip adduction strength to flexion 4/5 with pain.  External rotation 4/5 with pain abduction 4/5 with pain.  Internal rotation also painful and reduced strength.  L-spine nontender normal motion.  Lab and Radiology Results  Procedure: Real-time Ultrasound Guided Injection of left hip femoral acetabular joint Device: Philips Affiniti 50G Images permanently stored and available for review in the ultrasound unit. Verbal informed consent obtained.  Discussed risks and benefits of procedure. Warned about infection bleeding damage to structures skin hypopigmentation and fat  atrophy among others. Patient expresses understanding and agreement Time-out conducted.   Noted no overlying erythema, induration, or other signs of local infection.   Skin prepped in a sterile fashion.   Local anesthesia: Topical Ethyl chloride.   With sterile technique and under real time ultrasound guidance:  40 mg of Kenalog and 2 mL of Marcaine injected easily.   Completed without difficulty   Pain did not resolve suggesting pain occurring elsewhere than interarticular  Advised to call if fevers/chills, erythema, induration, drainage, or persistent bleeding.   Images permanently stored and available for review in the ultrasound unit.  Impression: Technically successful ultrasound guided injection.        DG Pelvis 1-2 Views  Result Date: 09/29/2019 CLINICAL DATA:  Persistent left groin pain EXAM: PELVIS - 1-2 VIEW COMPARISON:  None. FINDINGS: There is no evidence of pelvic fracture or diastasis. No pelvic bone lesions are seen. IMPRESSION: Negative. Electronically Signed   By: Kathreen Devoid   On: 09/29/2019 14:45   US Abdomen Complete  Result Date: 09/30/2019 CLINICAL DATA:  Epigastric pain EXAM: ABDOMEN ULTRASOUND COMPLETE COMPARISON:  MRI abdomen 09/10/2018 FINDINGS: Gallbladder: Surgically absent Common bile duct: Diameter: 0.2 cm. Liver: No focal lesion identified. Parenchymal echogenicity is mildly increased. Portal vein is patent on color Doppler imaging with normal direction of blood flow towards the liver. IVC: No abnormality visualized. Pancreas: Limited visualization. Spleen: Size and appearance within normal limits. Right Kidney: Length: Atrophic measuring 7.4 cm. Echogenicity within normal limits. There is a cyst in the lower pole measuring 1.9 cm. Left Kidney: Length:  11.0 cm. Echogenicity within normal limits. No mass or hydronephrosis visualized. Abdominal aorta: No aneurysm visualized. Other findings: None. IMPRESSION: 1.  No sonographic finding to explain the patient's  fatigue. 2.  Atrophic right kidney. 3. Mildly increased liver parenchymal echogenicity most commonly seen with hepatic steatosis. Electronically Signed   By: Audie Pinto M.D.   On: 09/30/2019 12:21   DG Abd Acute W/Chest  Result Date: 09/29/2019 CLINICAL DATA:  Epigastric pain EXAM: DG ABDOMEN ACUTE W/ 1V CHEST COMPARISON:  09/05/2017 FINDINGS: There is no evidence of dilated bowel loops or free intraperitoneal air. Stable 4 mm calcification is seen projecting over the right transverse process of L5. No additional radiopaque calculi or other significant radiographic abnormality is seen. Cholecystectomy clips. Heart size and mediastinal contours are within normal limits. Atherosclerotic calcification of the aortic knob. Both lungs are clear. IMPRESSION: 1. Nonobstructive bowel gas pattern. 2. No acute cardiopulmonary disease. 3. Stable 4 mm calcification projecting over the right transverse process of L5. Electronically Signed   By: Davina Poke D.O.   On: 09/29/2019 14:49      Impression and Recommendations:    Assessment and Plan: 64 y.o. female with left hip pain.  Multifactorial.  Patient has anterior hip pain which is acting like it has interarticular such as arthritis AVN or labrum tear.  However she did not have significant benefit from injection in clinic today.  Additionally she has lateral and posterior hip pain which is much more consistent with abductor or external rotator tendinopathy.  Plan for trial of physical therapy.  Additionally prescribed tramadol for limited pain control.  Recheck back if not improving.  Neck step would probably be MRI of the hip.Marland Kitchen  PDMP reviewed during this encounter. Orders Placed This Encounter  Procedures  . [place] LIMITED JOINT SPACE STRUCTURES LOW RIGHT(NO LINKED CHARGES)    Order Specific Question:   Reason for Exam (SYMPTOM  OR DIAGNOSIS REQUIRED)    Answer:   hip inj    Order Specific Question:   Preferred imaging location?    Answer:    [place]  . DG Lumbar Spine 2-3 Views    Standing Status:   Future    Standing Expiration Date:   09/30/2020    Order Specific Question:   Reason for Exam (SYMPTOM  OR DIAGNOSIS REQUIRED)    Answer:   eval poss L2 radic left    Order Specific Question:   Preferred imaging location?    Answer:   [facility]    Order Specific Question:   Radiology Contrast Protocol - do NOT remove file path    Answer:   [email or web address]  . Ambulatory referral to Physical Therapy    Referral Priority:   Routine    Referral Type:   Physical Medicine    Referral Reason:   Specialty Services Required    Requested Specialty:   Physical Therapy   Meds ordered this encounter  Medications  . traMADol (ULTRAM) 50 MG tablet    Sig: Take 1 tablet (50 mg total) by mouth every 8 (eight) hours as needed for severe pain.    Dispense:  15 tablet    Refill:  0    Discussed warning signs or symptoms. Please see discharge instructions. Patient expresses understanding.   The above documentation has been reviewed and is accurate and complete Lynne Leader, M.D.

## 2019-10-02 ENCOUNTER — Other Ambulatory Visit: Payer: Self-pay | Admitting: Internal Medicine

## 2019-10-02 ENCOUNTER — Encounter: Payer: Self-pay | Admitting: Internal Medicine

## 2019-10-02 LAB — URINALYSIS, ROUTINE W REFLEX MICROSCOPIC
Bacteria, UA: NONE SEEN /HPF
Bilirubin Urine: NEGATIVE
Glucose, UA: NEGATIVE
Hgb urine dipstick: NEGATIVE
Hyaline Cast: NONE SEEN /LPF
Ketones, ur: NEGATIVE
Nitrite: NEGATIVE
Protein, ur: NEGATIVE
RBC / HPF: NONE SEEN /HPF (ref 0–2)
Specific Gravity, Urine: 1.01 (ref 1.001–1.03)
Squamous Epithelial / HPF: NONE SEEN /HPF (ref <=5)
pH: 5.5 (ref 5.0–8.0)

## 2019-10-02 LAB — HEMOGLOBIN A1C
Hgb A1c MFr Bld: 5.2 % of total Hgb (ref <5.7)
Mean Plasma Glucose: 103 (calc)
eAG (mmol/L): 5.7 (calc)

## 2019-10-02 LAB — CBC WITH DIFFERENTIAL/PLATELET
Absolute Monocytes: 445 cells/uL (ref 200–950)
Basophils Absolute: 23 cells/uL (ref 0–200)
Basophils Relative: 0.4 %
Eosinophils Absolute: 222 cells/uL (ref 15–500)
Eosinophils Relative: 3.9 %
HCT: 36.8 % (ref 35.0–45.0)
Hemoglobin: 12 g/dL (ref 11.7–15.5)
Lymphs Abs: 1368 cells/uL (ref 850–3900)
MCH: 32.2 pg (ref 27.0–33.0)
MCHC: 32.6 g/dL (ref 32.0–36.0)
MCV: 98.7 fL (ref 80.0–100.0)
MPV: 12 fL (ref 7.5–12.5)
Monocytes Relative: 7.8 %
Neutro Abs: 3642 cells/uL (ref 1500–7800)
Neutrophils Relative %: 63.9 %
Platelets: 200 10*3/uL (ref 140–400)
RBC: 3.73 10*6/uL — ABNORMAL LOW (ref 3.80–5.10)
RDW: 12 % (ref 11.0–15.0)
Total Lymphocyte: 24 %
WBC: 5.7 10*3/uL (ref 3.8–10.8)

## 2019-10-02 LAB — LIPID PANEL
Cholesterol: 154 mg/dL (ref <200)
HDL: 55 mg/dL (ref >=50)
LDL Cholesterol (Calc): 83 mg/dL (calc)
Non-HDL Cholesterol (Calc): 99 mg/dL (calc) (ref <130)
Total CHOL/HDL Ratio: 2.8 (calc) (ref <5.0)
Triglycerides: 78 mg/dL (ref <150)

## 2019-10-02 LAB — URINE CULTURE

## 2019-10-02 LAB — HEPATIC FUNCTION PANEL
AG Ratio: 2.2 (calc) (ref 1.0–2.5)
ALT: 15 U/L (ref 6–29)
AST: 17 U/L (ref 10–35)
Albumin: 4.3 g/dL (ref 3.6–5.1)
Alkaline phosphatase (APISO): 42 U/L (ref 37–153)
Bilirubin, Direct: 0.1 mg/dL (ref 0.0–0.2)
Globulin: 2 g/dL (calc) (ref 1.9–3.7)
Indirect Bilirubin: 0.5 mg/dL (calc) (ref 0.2–1.2)
Total Bilirubin: 0.6 mg/dL (ref 0.2–1.2)
Total Protein: 6.3 g/dL (ref 6.1–8.1)

## 2019-10-02 LAB — VITAMIN B12: Vitamin B-12: >2000 pg/mL — ABNORMAL HIGH (ref 200–1100)

## 2019-10-02 LAB — TSH: TSH: 4.28 mIU/L (ref 0.40–4.50)

## 2019-10-02 LAB — VITAMIN D 25 HYDROXY (VIT D DEFICIENCY, FRACTURES): Vit D, 25-Hydroxy: 70 ng/mL (ref 30–100)

## 2019-10-02 MED ORDER — SULFAMETHOXAZOLE-TRIMETHOPRIM 800-160 MG PO TABS: 1.0000 | ORAL_TABLET | Freq: Two times a day (BID) | ORAL | 0 refills | Status: DC

## 2019-10-02 NOTE — Telephone Encounter (Signed)
Patient is rescheduled for the end of September

## 2019-10-02 NOTE — Telephone Encounter (Signed)
She should push out the follow up appointment till the end of September if able.

## 2019-10-04 ENCOUNTER — Encounter: Payer: Self-pay | Admitting: Internal Medicine

## 2019-10-04 NOTE — Assessment & Plan Note (Signed)
stable overall by history and exam, recent data reviewed with pt, and pt to continue medical treatment as before,  to f/u any worsening symptoms or concerns  

## 2019-10-04 NOTE — Assessment & Plan Note (Signed)
Etiology unclear, Exam otherwise benign, to check labs as documented, follow with expectant management  

## 2019-10-04 NOTE — Assessment & Plan Note (Signed)
Etiology unclear, for plain films, labs with h pylori, abd u/s and consider ct and/or GI dr Henrene Pastor if not improving

## 2019-10-04 NOTE — Assessment & Plan Note (Addendum)
Etiology unclear, ? djd vs bursitis vs other - for left hip film refer sport med  I spent 41 minutes in preparing to see the patient by review of recent labs, imaging and procedures, obtaining and reviewing separately obtained history, communicating with the patient and family or caregiver, ordering medications, tests or procedures, and documenting clinical information in the EHR including the differential Dx, treatment, and any further evaluation and other management of left groin pain, epigastric pain, fatigue, hld, dm

## 2019-10-05 ENCOUNTER — Encounter: Payer: Self-pay | Admitting: Family Medicine

## 2019-10-05 ENCOUNTER — Encounter: Payer: Self-pay | Admitting: Internal Medicine

## 2019-10-06 ENCOUNTER — Encounter: Payer: Self-pay | Admitting: Internal Medicine

## 2019-10-06 ENCOUNTER — Telehealth (HOSPITAL_COMMUNITY): Payer: Self-pay | Admitting: Physical Therapy

## 2019-10-06 MED ORDER — CEFDINIR 300 MG PO CAPS
300.0000 mg | ORAL_CAPSULE | Freq: Two times a day (BID) | ORAL | 0 refills | Status: DC
Start: 2019-10-06 — End: 2019-10-30

## 2019-10-06 NOTE — Telephone Encounter (Signed)
pt cancelled appt for 10/21/19 because she said she got a shot and it has helped, she no longer needs therapy

## 2019-10-09 ENCOUNTER — Encounter: Payer: Self-pay | Admitting: Internal Medicine

## 2019-10-09 MED ORDER — ONDANSETRON HCL 4 MG PO TABS: 4.0000 mg | ORAL_TABLET | Freq: Three times a day (TID) | ORAL | 2 refills | Status: DC | PRN

## 2019-10-21 ENCOUNTER — Other Ambulatory Visit: Payer: Self-pay

## 2019-10-21 ENCOUNTER — Other Ambulatory Visit: Payer: Self-pay | Admitting: Internal Medicine

## 2019-10-21 ENCOUNTER — Ambulatory Visit (HOSPITAL_COMMUNITY): Payer: Medicare Other | Admitting: Physical Therapy

## 2019-10-21 MED ORDER — LOVASTATIN 40 MG PO TABS: 40.0000 mg | ORAL_TABLET | Freq: Every day | ORAL | 3 refills | Status: DC

## 2019-10-21 NOTE — Telephone Encounter (Signed)
Sent in today for patient. 

## 2019-10-21 NOTE — Telephone Encounter (Signed)
New message:    Pt states her medication was sent to the wrong pharmacy and she needs her lovastatin (MEVACOR) 40 MG tablet sent to [facility] [address], [place]

## 2019-10-22 ENCOUNTER — Ambulatory Visit: Payer: Medicare Other | Admitting: Family Medicine

## 2019-10-30 ENCOUNTER — Other Ambulatory Visit: Payer: Self-pay

## 2019-10-30 ENCOUNTER — Ambulatory Visit (INDEPENDENT_AMBULATORY_CARE_PROVIDER_SITE_OTHER): Payer: Medicare Other | Admitting: Internal Medicine

## 2019-10-30 ENCOUNTER — Encounter: Payer: Self-pay | Admitting: Internal Medicine

## 2019-10-30 VITALS — BP 130/80 | HR 50 | Temp 98.6°F | Ht 62.0 in | Wt 161.0 lb

## 2019-10-30 DIAGNOSIS — N183 Chronic kidney disease, stage 3 unspecified: Secondary | ICD-10-CM

## 2019-10-30 DIAGNOSIS — I10 Essential (primary) hypertension: Secondary | ICD-10-CM | POA: Diagnosis not present

## 2019-10-30 DIAGNOSIS — E119 Type 2 diabetes mellitus without complications: Secondary | ICD-10-CM | POA: Diagnosis not present

## 2019-10-30 DIAGNOSIS — E785 Hyperlipidemia, unspecified: Secondary | ICD-10-CM

## 2019-10-30 MED ORDER — LOVASTATIN 40 MG PO TABS: 80.0000 mg | ORAL_TABLET | Freq: Every day | ORAL | 3 refills | Status: DC

## 2019-10-30 NOTE — Progress Notes (Signed)
Subjective:    Patient ID: Heather Dawson, female    DOB: 1955-03-19, 64 y.o.   MRN: [id number]  HPI  Here to f/u; overall doing ok,  Pt denies chest pain, increasing sob or doe, wheezing, orthopnea, PND, increased LE swelling, palpitations, dizziness or syncope.  Pt denies new neurological symptoms such as new headache, or facial or extremity weakness or numbness.  Pt denies polydipsia, polyuria, or low sugar episode.  Pt states overall good compliance with meds, mostly trying to follow appropriate diet, with wt overall stable,  but little exercise however.  Left hip pain improved after Dr Georgina Snell, and UTI is treated, back to walking about 4 miles per day.  Had gained aout 10 lbs with injury, but now back to atleast not gaining more wt Wt Readings from Last 3 Encounters:  10/30/19 161 lb (73 kg)  10/01/19 161 lb (73 kg)  09/29/19 161 lb (73 kg)   Past Medical History:  Diagnosis Date  . Abdominal pain, epigastric 12/20/2006  . ALLERGIC RHINITIS 12/25/2006  . Allergy   . ANXIETY 10/16/2006   no per pt  . Arthritis    right shoulder  . BACK PAIN 06/15/2008  . Blood transfusion without reported diagnosis   . BRADYCARDIA 05/04/2008  . C. difficile diarrhea   . Carpal tunnel syndrome 10/16/2006  . Cellulitis and abscess of other specified site 06/15/2008  . CHEST PAIN 09/08/2009  . Chronic low back pain   . CIRRHOSIS 10/16/2006  . CKD (chronic kidney disease)    2008- had Ecoli and caused renal failure, no problems since then  . CKD (chronic kidney disease) stage 3, GFR 30-59 ml/min 10/16/2006   Qualifier: Diagnosis of  By: Jenny Reichmann MD, [place] 12/20/2006  . Complication of anesthesia    awake during 2 surgeries  . CONTUSION, LOWER LEG 09/15/2008  . DEPRESSION 10/16/2006   no per pt  . Diabetes mellitus without complication ([place])   . Esophageal reflux 12/25/2006  . HYPERLIPIDEMIA 12/25/2006  . Hyperlipidemia   . HYPERTENSION 08/15/2006  . Hypertension   . HYPOTHYROIDISM  08/15/2006  . Kidney failure, acute ([place]) 2009   as a result of severe E Coli infection  . Left-sided carotid artery disease ([place])   . MIGRAINE HEADACHE 10/16/2006  . Obesity 10/16/2006  . OVARIAN CYST 12/20/2006  . Psoriasis 01/03/2011  . SHINGLES, HX OF 12/20/2006  . Stroke [facility])    3 strokes 2008-no deficits, only on ASA  . TRANSIENT ISCHEMIC ATTACKS, HX OF 12/20/2006  . VERTIGO 09/15/2007  . VITAMIN B12 DEFICIENCY 01/09/2007   Past Surgical History:  Procedure Laterality Date  . ABDOMINAL HYSTERECTOMY  02/2006  . ANTERIOR CERVICAL DECOMP/DISCECTOMY FUSION  2005  . CESAREAN SECTION    . CHOLECYSTECTOMY    . COLONOSCOPY    . ENDARTERECTOMY Left 08/25/2014   Procedure: LEFT CAROTID ENDARTERECTOMY WITH HEMASHIELD PATCH ANGIOPLASTY;  Surgeon: Elam Dutch, MD;  Location: [place];  Service: Vascular;  Laterality: Left;  . NECK SURGERY  2005  . ROTATOR CUFF REPAIR Right 02/2014   Dr Veverly Fells    reports that she has quit smoking. She has never used smokeless tobacco. She reports that she does not drink alcohol and does not use drugs. family history includes Diabetes in her father, sister, and another family member; Heart disease in her brother, brother, father, and mother; Hypertension in her brother, brother and another family member; Lupus in her mother; Raynaud syndrome in her mother;  Stroke in her father. Allergies  Allergen Reactions  . Bactrim [Sulfamethoxazole-Trimethoprim] Nausea And Vomiting  . Cefuroxime Axetil Swelling     edema/swelling  . Ciprofloxacin Swelling  . Levaquin [Levofloxacin In D5w] Other (See Comments)    GI upset  . Lipitor [Atorvastatin] Other (See Comments)    Myalgia   . Lyrica [Pregabalin] Other (See Comments)    HA, confusion, swelling  . Macrodantin [Nitrofurantoin Macrocrystal] Other (See Comments)    GI upset, diarrhea and sob  . Gabapentin Rash   Current Outpatient Medications on File Prior to Visit  Medication Sig Dispense Refill  .  acetaminophen (TYLENOL) 500 MG tablet Take 1,000 mg by mouth every 8 (eight) hours as needed (pain).    Marland Kitchen aspirin 325 MG EC tablet Take 325 mg by mouth daily.      . calcium carbonate (OSCAL) 1500 (600 Ca) MG TABS tablet calcium carbonate 600 mg calcium (1,500 mg) tablet    . Cholecalciferol (VITAMIN D3) 1000 units CAPS Take 2 capsules (2,000 Units total) by mouth daily. 60 capsule 0  . clobetasol (TEMOVATE) 0.05 % external solution Apply 1 application topically daily as needed (scalp psoriasis).   0  . diclofenac sodium (VOLTAREN) 1 % GEL Apply 4 g topically 4 (four) times daily as needed. 400 g 11  . hydrALAZINE (APRESOLINE) 50 MG tablet Take 1 tablet (50 mg total) by mouth 3 (three) times daily. 90 tablet 11  . ID NOW COVID-19 KIT See admin instructions.    Marland Kitchen levocetirizine (XYZAL) 5 MG tablet     . levothyroxine (SYNTHROID) 75 MCG tablet Take 1 tablet (75 mcg total) by mouth daily. 90 tablet 3  . lisinopril (ZESTRIL) 20 MG tablet Take 1 tablet (20 mg total) by mouth daily. 90 tablet 3  . mupirocin cream (BACTROBAN) 2 % Apply 1 application topically 2 (two) times daily.    . pantoprazole (PROTONIX) 40 MG tablet Take 1 tablet (40 mg total) by mouth daily. 90 tablet 3  . SUMAtriptan (IMITREX) 100 MG tablet TAKE 1 TABLET BY MOUTH EVERY 2 HOURS AS NEEDED FOR MIGRAINE OR HEADACHE, MAY REPEAT IN 2 HOURS IF HEADACHE PERSISTS OR RECURS 10 tablet 2  . triamcinolone cream (KENALOG) 0.1 % SMARTSIG:1 Application Topical 2-3 Times Daily    . vitamin B-12 (CYANOCOBALAMIN) 1000 MCG tablet Take 1 tablet (1,000 mcg total) by mouth daily. 90 tablet 1   No current facility-administered medications on file prior to visit.    Review of Systems All otherwise neg per pt    Objective:   Physical Exam BP 130/80 (BP Location: Left Arm, Patient Position: Sitting, Cuff Size: Large)   Pulse (!) 50   Temp 98.6 F (37 C) (Oral)   Ht _0  (1.575 m)   Wt 161 lb (73 kg)   SpO2 98%   BMI 29.45 kg/m  VS noted,    Constitutional: Pt appears in NAD HENT: Head: NCAT.  Right Ear: External ear normal.  Left Ear: External ear normal.  Eyes: . Pupils are equal, round, and reactive to light. Conjunctivae and EOM are normal Nose: without d/c or deformity Neck: Neck supple. Gross normal ROM Cardiovascular: Normal rate and regular rhythm.   Pulmonary/Chest: Effort normal and breath sounds without rales or wheezing.  Abd:  Soft, NT, ND, + BS, no organomegaly Neurological: Pt is alert. At baseline orientation, motor grossly intact Skin: Skin is warm. No rashes, other new lesions, no LE edema Psychiatric: Pt behavior is normal without agitation  All  otherwise neg per pt Lab Results  Component Value Date   WBC 5.7 09/29/2019   HGB 12.0 09/29/2019   HCT 36.8 09/29/2019   PLT 200 09/29/2019   GLUCOSE 96 07/03/2019   CHOL 154 09/29/2019   TRIG 78 09/29/2019   HDL 55 09/29/2019   LDLDIRECT 140.0 08/05/2018   LDLCALC 83 09/29/2019   ALT 15 09/29/2019   AST 17 09/29/2019   NA 138 07/03/2019   K 4.9 07/03/2019   CL 103 07/03/2019   CREATININE 1.66 (H) 07/03/2019   BUN 41 (H) 07/03/2019   CO2 27 07/03/2019   TSH 4.28 09/29/2019   INR 0.99 08/17/2014   HGBA1C 5.2 09/29/2019   MICROALBUR 3.3 (H) 05/01/2019      Assessment & Plan:

## 2019-10-30 NOTE — Patient Instructions (Signed)
Ok to increase the lovastatin to 80 mg per day  Please continue all other medications as before, and refills have been done if requested.  Please have the pharmacy call with any other refills you may need.  Please continue your efforts at being more active, low cholesterol diet, and weight control.  Please keep your appointments with your specialists as you may have planned  Please make an Appointment to return in 6 months, or sooner if needed

## 2019-10-31 ENCOUNTER — Encounter: Payer: Self-pay | Admitting: Internal Medicine

## 2019-10-31 NOTE — Assessment & Plan Note (Signed)
stable overall by history and exam, recent data reviewed with pt, and pt to continue medical treatment as before,  to f/u any worsening symptoms or concerns  

## 2019-10-31 NOTE — Assessment & Plan Note (Addendum)

## 2019-10-31 NOTE — Assessment & Plan Note (Addendum)
Goal ldl < 70, for increased lovastatin 80 qd, o/w stable overall by history and exam, recent data reviewed with pt, and pt to continue medical treatment as before,  to f/u any worsening symptoms or concerns

## 2019-11-02 ENCOUNTER — Encounter: Payer: Self-pay | Admitting: Internal Medicine

## 2019-11-02 DIAGNOSIS — N183 Chronic kidney disease, stage 3 unspecified: Secondary | ICD-10-CM | POA: Diagnosis not present

## 2019-11-02 DIAGNOSIS — E785 Hyperlipidemia, unspecified: Secondary | ICD-10-CM | POA: Diagnosis not present

## 2019-11-02 DIAGNOSIS — I129 Hypertensive chronic kidney disease with stage 1 through stage 4 chronic kidney disease, or unspecified chronic kidney disease: Secondary | ICD-10-CM | POA: Diagnosis not present

## 2019-11-02 DIAGNOSIS — N179 Acute kidney failure, unspecified: Secondary | ICD-10-CM | POA: Diagnosis not present

## 2019-11-02 DIAGNOSIS — Z20822 Contact with and (suspected) exposure to covid-19: Secondary | ICD-10-CM | POA: Diagnosis not present

## 2019-11-02 DIAGNOSIS — Z03818 Encounter for observation for suspected exposure to other biological agents ruled out: Secondary | ICD-10-CM | POA: Diagnosis not present

## 2019-11-03 ENCOUNTER — Telehealth: Payer: Self-pay | Admitting: Internal Medicine

## 2019-11-03 NOTE — Telephone Encounter (Signed)
Staff to refer pt asap  - husband has tested +  This was note to pt earlier today - staff to assist pt with referral  I will have to look into his treatment (ivermectin is not an accepted treatment) but for you - you would need this treatment that was just started I think:  Please see below for important information about the new outpatient COVID-19 monoclonal antibody subcutaneous injection therapy clinic at the [facility] at [address], [place], [place] [address] and the corresponding referral process. The clinic will start taking referrals today. Please share this information with staff in huddles, meetings, and post for staff to see.            Starting Monday, September 13th, the outpatient COVID-19 monoclonal antibodies subcutaneous injection clinic will open at [address], [place], [place] [address]           Hours: 8 a.m. -  5 p.m. - Mondays and Wednesdays (we will extend to more days as demand increases)           Patient referrals can be made through the [place] hotline number, [phone], or through [place] to the [facility] group           The clinic will start taking referrals today.

## 2019-11-04 NOTE — Telephone Encounter (Signed)
Called and left pts information and reason for referral per Dr. Jenny Reichmann.

## 2019-11-05 ENCOUNTER — Telehealth: Payer: Self-pay | Admitting: Oncology

## 2019-11-05 NOTE — Telephone Encounter (Signed)
No, ok to be tested as she mentioned and:  1)  if she is negative the subq monoclonal ab will not hurt the kidneys  2)  if she is positive, the IV monoclonal ab will not hurt the kidneys  3)  there is no other specific recommendations beyond what she already does to avoid further kidney damage

## 2019-11-05 NOTE — Telephone Encounter (Signed)
    Patient calling to report she is having a COVID19 test done today, she has cold symptoms, her spouse is Covid positive. She wants to know if she test positive are there test or medications she should avoid. She is concerned treatments may damage her kidney

## 2019-11-05 NOTE — Telephone Encounter (Signed)
Spoke with patient and info given. She has been informed to call us if she is positive and we will get her set up for the infusion center at [facility].

## 2019-11-05 NOTE — Telephone Encounter (Signed)
Re: Mab infusion  Called and spoke to Heather Dawson.  Patient is symptomatic of Covid.  Her husband tested positive and has received infusion.  She is awaiting her test results which she should get back tomorrow.  She will call clinic if they are positive.  She is already spoken to her nephrologist and PCP regarding infusion.  Past Medical History:  Diagnosis Date  . Abdominal pain, epigastric 12/20/2006  . ALLERGIC RHINITIS 12/25/2006  . Allergy   . ANXIETY 10/16/2006   no per pt  . Arthritis    right shoulder  . BACK PAIN 06/15/2008  . Blood transfusion without reported diagnosis   . BRADYCARDIA 05/04/2008  . C. difficile diarrhea   . Carpal tunnel syndrome 10/16/2006  . Cellulitis and abscess of other specified site 06/15/2008  . CHEST PAIN 09/08/2009  . Chronic low back pain   . CIRRHOSIS 10/16/2006  . CKD (chronic kidney disease)    2008- had Ecoli and caused renal failure, no problems since then  . CKD (chronic kidney disease) stage 3, GFR 30-59 ml/min 10/16/2006   Qualifier: Diagnosis of  By: Jenny Reichmann MD, [place] 12/20/2006  . Complication of anesthesia    awake during 2 surgeries  . CONTUSION, LOWER LEG 09/15/2008  . DEPRESSION 10/16/2006   no per pt  . Diabetes mellitus without complication ([place])   . Esophageal reflux 12/25/2006  . HYPERLIPIDEMIA 12/25/2006  . Hyperlipidemia   . HYPERTENSION 08/15/2006  . Hypertension   . HYPOTHYROIDISM 08/15/2006  . Kidney failure, acute ([place]) 2009   as a result of severe E Coli infection  . Left-sided carotid artery disease ([place])   . MIGRAINE HEADACHE 10/16/2006  . Obesity 10/16/2006  . OVARIAN CYST 12/20/2006  . Psoriasis 01/03/2011  . SHINGLES, HX OF 12/20/2006  . Stroke [facility])    3 strokes 2008-no deficits, only on ASA  . TRANSIENT ISCHEMIC ATTACKS, HX OF 12/20/2006  . VERTIGO 09/15/2007  . VITAMIN B12 DEFICIENCY 01/09/2007   She meets criteria for a [facility] infusion.  Heather Casa, NP 11/05/2019 3:26 PM

## 2019-11-13 ENCOUNTER — Ambulatory Visit (INDEPENDENT_AMBULATORY_CARE_PROVIDER_SITE_OTHER): Payer: Medicare Other

## 2019-11-13 DIAGNOSIS — Z20822 Contact with and (suspected) exposure to covid-19: Secondary | ICD-10-CM | POA: Diagnosis not present

## 2019-11-13 DIAGNOSIS — Z0000 Encounter for general adult medical examination without abnormal findings: Secondary | ICD-10-CM | POA: Diagnosis not present

## 2019-11-13 NOTE — Progress Notes (Signed)
I connected with Lajoyce Corners today by telephone and verified that I am speaking with the correct person using two identifiers. Location patient: home Location provider: work Persons participating in the virtual visit: Ulanda Tackett and Lisette Abu, LPN   I discussed the limitations, risks, security and privacy concerns of performing an evaluation and management service by telephone and the availability of in person appointments. I also discussed with the patient that there may be a patient responsible charge related to this service. The patient expressed understanding and verbally consented to this telephonic visit.    Interactive audio and video telecommunications were attempted between this provider and patient, however failed, due to patient having technical difficulties OR patient did not have access to video capability.  We continued and completed visit with audio only.  Some vital signs may be absent or patient reported.   Time Spent with patient on telephone encounter: 30 minutes  Subjective:   Heather Dawson is a 64 y.o. female who presents for Medicare Annual (Subsequent) preventive examination.  Review of Systems    No ROS. Medicare Wellness Visit. Additional risk factors are reflected in social history. Cardiac Risk Factors include: advanced age (>89mn, >>47women);dyslipidemia;family history of premature cardiovascular disease;hypertension Sleep Patterns: No sleep issues, feels rested on waking and sleeps 8 hours nightly. Home Safety/Smoke Alarms: Feels safe in home; uses home alarm. Smoke alarms in place. Living environment: 1-story home; Lives with spouse; no needs for DME; good support system. Seat Belt Safety/Bike Helmet: Wears seat belt.    Objective:    There were no vitals filed for this visit. There is no height or weight on file to calculate BMI.  Advanced Directives 11/13/2019 09/10/2017 12/01/2015 08/25/2014 08/17/2014 09/22/2013  Does Patient Have a Medical Advance  Directive? _0  Patient does not have advance directive  Would patient like information on creating a medical advance directive? No - Patient declined - No - patient declined information No - patient declined information No - patient declined information -    Current Medications (verified) Outpatient Encounter Medications as of 11/13/2019  Medication Sig  . acetaminophen (TYLENOL) 500 MG tablet Take 1,000 mg by mouth every 8 (eight) hours as needed (pain).  .Marland Kitchenaspirin 325 MG EC tablet Take 325 mg by mouth daily.    . calcium carbonate (OSCAL) 1500 (600 Ca) MG TABS tablet calcium carbonate 600 mg calcium (1,500 mg) tablet  . Cholecalciferol (VITAMIN D3) 1000 units CAPS Take 2 capsules (2,000 Units total) by mouth daily.  . clobetasol (TEMOVATE) 0.05 % external solution Apply 1 application topically daily as needed (scalp psoriasis).   .Marland Kitchendiclofenac sodium (VOLTAREN) 1 % GEL Apply 4 g topically 4 (four) times daily as needed.  . hydrALAZINE (APRESOLINE) 50 MG tablet Take 1 tablet (50 mg total) by mouth 3 (three) times daily.  . ID NOW COVID-19 KIT See admin instructions.  .Marland Kitchenlevocetirizine (XYZAL) 5 MG tablet   . levothyroxine (SYNTHROID) 75 MCG tablet Take 1 tablet (75 mcg total) by mouth daily.  .Marland Kitchenlisinopril (ZESTRIL) 20 MG tablet Take 1 tablet (20 mg total) by mouth daily.  .Marland Kitchenlovastatin (MEVACOR) 40 MG tablet Take 2 tablets (80 mg total) by mouth at bedtime.  . mupirocin cream (BACTROBAN) 2 % Apply 1 application topically 2 (two) times daily.  . pantoprazole (PROTONIX) 40 MG tablet Take 1 tablet (40 mg total) by mouth daily.  . SUMAtriptan (IMITREX) 100 MG tablet TAKE 1 TABLET BY MOUTH EVERY 2 HOURS  AS NEEDED FOR MIGRAINE OR HEADACHE, MAY REPEAT IN 2 HOURS IF HEADACHE PERSISTS OR RECURS  . triamcinolone cream (KENALOG) 0.1 % SMARTSIG:1 Application Topical 2-3 Times Daily  . vitamin B-12 (CYANOCOBALAMIN) 1000 MCG tablet Take 1 tablet (1,000 mcg total) by mouth daily.   No  facility-administered encounter medications on file as of 11/13/2019.    Allergies (verified) Bactrim [sulfamethoxazole-trimethoprim], Cefuroxime axetil, Ciprofloxacin, Levaquin [levofloxacin in d5w], Lipitor [atorvastatin], Lyrica [pregabalin], Macrodantin [nitrofurantoin macrocrystal], and Gabapentin   History: Past Medical History:  Diagnosis Date  . Abdominal pain, epigastric 12/20/2006  . ALLERGIC RHINITIS 12/25/2006  . Allergy   . ANXIETY 10/16/2006   no per pt  . Arthritis    right shoulder  . BACK PAIN 06/15/2008  . Blood transfusion without reported diagnosis   . BRADYCARDIA 05/04/2008  . C. difficile diarrhea   . Carpal tunnel syndrome 10/16/2006  . Cellulitis and abscess of other specified site 06/15/2008  . CHEST PAIN 09/08/2009  . Chronic low back pain   . CIRRHOSIS 10/16/2006  . CKD (chronic kidney disease)    2008- had Ecoli and caused renal failure, no problems since then  . CKD (chronic kidney disease) stage 3, GFR 30-59 ml/min 10/16/2006   Qualifier: Diagnosis of  By: Jenny Reichmann MD, [place] 12/20/2006  . Complication of anesthesia    awake during 2 surgeries  . CONTUSION, LOWER LEG 09/15/2008  . DEPRESSION 10/16/2006   no per pt  . Diabetes mellitus without complication ([place])   . Esophageal reflux 12/25/2006  . HYPERLIPIDEMIA 12/25/2006  . Hyperlipidemia   . HYPERTENSION 08/15/2006  . Hypertension   . HYPOTHYROIDISM 08/15/2006  . Kidney failure, acute ([place]) 2009   as a result of severe E Coli infection  . Left-sided carotid artery disease ([place])   . MIGRAINE HEADACHE 10/16/2006  . Obesity 10/16/2006  . OVARIAN CYST 12/20/2006  . Psoriasis 01/03/2011  . SHINGLES, HX OF 12/20/2006  . Stroke [facility])    3 strokes 2008-no deficits, only on ASA  . TRANSIENT ISCHEMIC ATTACKS, HX OF 12/20/2006  . VERTIGO 09/15/2007  . VITAMIN B12 DEFICIENCY 01/09/2007   Past Surgical History:  Procedure Laterality Date  . ABDOMINAL HYSTERECTOMY  02/2006  . ANTERIOR CERVICAL  DECOMP/DISCECTOMY FUSION  2005  . CESAREAN SECTION    . CHOLECYSTECTOMY    . COLONOSCOPY    . ENDARTERECTOMY Left 08/25/2014   Procedure: LEFT CAROTID ENDARTERECTOMY WITH HEMASHIELD PATCH ANGIOPLASTY;  Surgeon: Elam Dutch, MD;  Location: [place];  Service: Vascular;  Laterality: Left;  . NECK SURGERY  2005  . ROTATOR CUFF REPAIR Right 02/2014   Dr Veverly Fells   Family History  Problem Relation Age of Onset  . Stroke Father   . Heart disease Father   . Diabetes Father   . Lupus Mother   . Raynaud syndrome Mother   . Heart disease Mother   . Diabetes Sister   . Heart disease Brother   . Hypertension Brother   . Heart disease Brother   . Hypertension Brother   . Diabetes Other   . Hypertension Other   . Colon cancer Neg Hx   . Esophageal cancer Neg Hx   . Rectal cancer Neg Hx   . Stomach cancer Neg Hx    Social History   Socioeconomic History  . Marital status: Married    Spouse name: Not on file  . Number of children: Not on file  . Years of education: Not on file  .  Highest education level: Not on file  Occupational History  . Not on file  Tobacco Use  . Smoking status: Former Research scientist (life sciences)  . Smokeless tobacco: Never Used  . Tobacco comment: quit smoking over 30 years ago  Vaping Use  . Vaping Use: Never used  Substance and Sexual Activity  . Alcohol use: No  . Drug use: No  . Sexual activity: Not on file  Other Topics Concern  . Not on file  Social History Narrative  . Not on file   Social Determinants of Health   Financial Resource Strain: Low Risk   . Difficulty of Paying Living Expenses: Not hard at all  Food Insecurity: No Food Insecurity  . Worried About Charity fundraiser in the Last Year: Never true  . Ran Out of Food in the Last Year: Never true  Transportation Needs: No Transportation Needs  . Lack of Transportation (Medical): No  . Lack of Transportation (Non-Medical): No  Physical Activity: Sufficiently Active  . Days of Exercise per Week: 7 days   . Minutes of Exercise per Session: 30 min  Stress: No Stress Concern Present  . Feeling of Stress : Not at all  Social Connections: Socially Integrated  . Frequency of Communication with Friends and Family: More than three times a week  . Frequency of Social Gatherings with Friends and Family: More than three times a week  . Attends Religious Services: More than 4 times per year  . Active Member of Clubs or Organizations: Yes  . Attends Archivist Meetings: More than 4 times per year  . Marital Status: Married    Tobacco Counseling Counseling given: Not Answered Comment: quit smoking over 30 years ago   Clinical Intake:     Pain : No/denies pain     Nutritional Risks: None Diabetes: No  How often do you need to have someone help you when you read instructions, pamphlets, or other written materials from your doctor or pharmacy?: 1 - Never What is the last grade level you completed in school?: HSG  Diabetic? no  Interpreter Needed?: No  Information entered by :: Lisette Abu, LPN   Activities of Daily Living In your present state of health, do you have any difficulty performing the following activities: 11/13/2019 10/30/2019  Hearing? N N  Vision? N N  Difficulty concentrating or making decisions? N N  Walking or climbing stairs? N N  Dressing or bathing? N N  Doing errands, shopping? N N  Preparing Food and eating ? N -  Using the Toilet? N -  In the past six months, have you accidently leaked urine? N -  Do you have problems with loss of bowel control? N -  Managing your Medications? N -  Managing your Finances? N -  Housekeeping or managing your Housekeeping? N -  Some recent data might be hidden    Patient Care Team: Biagio Borg, MD as PCP - General Gwenlyn Found Pearletha Forge, MD as Consulting Physician (Cardiology) Inda Castle, MD (Inactive) as Consulting Physician (Gastroenterology) Netta Cedars, MD as Consulting Physician (Orthopedic  Surgery) Melina Schools, MD as Consulting Physician (Orthopedic Surgery)  Indicate any recent Medical Services you may have received from other than [facility] providers in the past year (date may be approximate).     Assessment:   This is a routine wellness examination for [place].  Hearing/Vision screen No exam data present  Dietary issues and exercise activities discussed: Current Exercise Habits: Home exercise routine, Type  of exercise: walking (Walks 5 miles everyday; has lost 50 pounds), Time (Minutes): 60, Frequency (Times/Week): 7, Weekly Exercise (Minutes/Week): 420, Intensity: Moderate, Exercise limited by: None identified  Goals    . Patient Stated     My goal is to maintain my weight; keep being active by walking everyday 4-5 miles.  I met my goal of losing 50 pounds and I want to keep going.      Depression Screen PHQ 2/9 Scores 11/13/2019 07/03/2019 04/30/2018 04/23/2017 04/28/2014 07/14/2013  PHQ - 2 Score 0 0 0 0 0 0    Fall Risk Fall Risk  11/13/2019 07/03/2019 04/30/2018 04/23/2017 04/28/2014  Falls in the past year? 0 0 0 No No  Number falls in past yr: 0 0 - - -  Injury with Fall? 0 0 - - -  Risk for fall due to : No Fall Risks No Fall Risks - - -  Follow up Falls evaluation completed Falls evaluation completed - - -    Any stairs in or around the home? No  If so, are there any without handrails? No  Home free of loose throw rugs in walkways, pet beds, electrical cords, etc? Yes  Adequate lighting in your home to reduce risk of falls? Yes   ASSISTIVE DEVICES UTILIZED TO PREVENT FALLS:  Life alert? No  Use of a cane, walker or w/c? No  Grab bars in the bathroom? No  Shower chair or bench in shower? Yes  Elevated toilet seat or a handicapped toilet? Yes   TIMED UP AND GO:  Was the test performed? No .  Length of time to ambulate 10 feet: 0 sec.   Gait steady and fast without use of assistive device  Cognitive Function: Patient is cogitatively intact.         Immunizations Immunization History  Administered Date(s) Administered  . Influenza Split 01/03/2011  . Influenza Whole 12/28/2005, 01/07/2007, 11/18/2007, 11/18/2008  . Influenza,inj,Quad PF,6+ Mos 12/30/2012, 11/06/2013, 11/10/2014, 12/20/2015, 11/13/2016, 10/24/2017, 10/31/2018  . Pneumococcal Conjugate-13 05/03/2016  . Pneumococcal Polysaccharide-23 04/23/2017  . Td 08/30/2009  . Zoster Recombinat (Shingrix) 04/23/2017    TDAP status: Due, Education has been provided regarding the importance of this vaccine. Advised may receive this vaccine at local pharmacy or Health Dept. Aware to provide a copy of the vaccination record if obtained from local pharmacy or Health Dept. Verbalized acceptance and understanding. Flu Vaccine status: Up to date Pneumococcal vaccine status: Up to date Covid-19 vaccine status: Completed vaccines  Qualifies for Shingles Vaccine? Yes   Zostavax completed Yes   Shingrix Completed?: Yes (need second dose)  Screening Tests Health Maintenance  Topic Date Due  . COVID-19 Vaccine (1) Never done  . MAMMOGRAM  08/21/2018  . OPHTHALMOLOGY EXAM  05/12/2019  . TETANUS/TDAP  08/31/2019  . INFLUENZA VACCINE  09/20/2019  . HEMOGLOBIN A1C  03/31/2020  . FOOT EXAM  07/02/2020  . COLONOSCOPY  12/16/2023  . PNEUMOCOCCAL POLYSACCHARIDE VACCINE AGE 59-64 HIGH RISK  Completed  . Hepatitis C Screening  Completed  . HIV Screening  Completed    Health Maintenance  Health Maintenance Due  Topic Date Due  . COVID-19 Vaccine (1) Never done  . MAMMOGRAM  08/21/2018  . OPHTHALMOLOGY EXAM  05/12/2019  . TETANUS/TDAP  08/31/2019  . INFLUENZA VACCINE  09/20/2019    Colorectal cancer screening: Completed 12/16/2018. Repeat every 5 years Mammogram status: Completed 08/20/2016. Repeat every year (patient is scheduled for 11/23/2019) Bone density status: never ordered.   Lung Cancer  Screening: (Low Dose CT Chest recommended if Age 36-80 years, 30 pack-year currently  smoking OR have quit w/in 15years.) does not qualify.   Lung Cancer Screening Referral: no  Additional Screening:  Hepatitis C Screening: does qualify; Completed yes  Vision Screening: Recommended annual ophthalmology exams for early detection of glaucoma and other disorders of the eye. Is the patient up to date with their annual eye exam?  Yes  Who is the provider or what is the name of the office in which the patient attends annual eye exams? Webb Laws, MD If pt is not established with a provider, would they like to be referred to a provider to establish care? No .   Dental Screening: Recommended annual dental exams for proper oral hygiene  Community Resource Referral / Chronic Care Management: CRR required this visit?  No   CCM required this visit?  No      Plan:     I have personally reviewed and noted the following in the patient's chart:   . Medical and social history . Use of alcohol, tobacco or illicit drugs  . Current medications and supplements . Functional ability and status . Nutritional status . Physical activity . Advanced directives . List of other physicians . Hospitalizations, surgeries, and ER visits in previous 12 months . Vitals . Screenings to include cognitive, depression, and falls . Referrals and appointments  In addition, I have reviewed and discussed with patient certain preventive protocols, quality metrics, and best practice recommendations. A written personalized care plan for preventive services as well as general preventive health recommendations were provided to patient.     Sheral Flow, LPN   [id number]/[id number]   Nurse Notes:  Patient is cogitatively intact. There were no vitals filed for this visit. There is no height or weight on file to calculate BMI. Patient stated that she has no issues with gait or balance; does not use any assistive devices.

## 2019-11-13 NOTE — Patient Instructions (Addendum)
Heather Dawson , Thank you for taking time to come for your Medicare Wellness Visit. I appreciate your ongoing commitment to your health goals. Please review the following plan we discussed and let me know if I can assist you in the future.   Screening recommendations/referrals: Colonoscopy: 12/16/2018; due every 5 years Mammogram: scheduled for 11/23/2019 (per patient) Bone Density: never done/ordered Recommended yearly ophthalmology/optometry visit for glaucoma screening and checkup Recommended yearly dental visit for hygiene and checkup  Vaccinations: Influenza vaccine: 10/31/2018 Pneumococcal vaccine: completed Tdap vaccine: 08/30/2009; due every 10 years (overdue); can check with local pharmacy or health dept. Shingles vaccine: 04/23/2017; need 2nd dose  Covid-19: completed  Advanced directives: Advance directive discussed with you today. Even though you declined this today please call our office should you change your mind and we can give you the proper paperwork for you to fill out.  Conditions/risks identified: Yes; Reviewed health maintenance screenings with patient today and relevant education, vaccines, and/or referrals were provided. Please continue to do your personal lifestyle choices by: daily care of teeth and gums, regular physical activity (goal should be 5 days a week for 30 minutes), eat a healthy diet, avoid tobacco and drug use, limiting any alcohol intake, taking a low-dose aspirin (if not allergic or have been advised by your provider otherwise) and taking vitamins and minerals as recommended by your provider. Continue doing brain stimulating activities (puzzles, reading, adult coloring books, staying active) to keep memory sharp. Continue to eat heart healthy diet (full of fruits, vegetables, whole grains, lean protein, water--limit salt, fat, and sugar intake) and increase physical activity as tolerated.  Next appointment: Please schedule your next Medicare Wellness Visit with  your Nurse Health Advisor in 1 year by calling [phone].  Preventive Care 40-64 Years, Female Preventive care refers to lifestyle choices and visits with your health care provider that can promote health and wellness. What does preventive care include?  A yearly physical exam. This is also called an annual well check.  Dental exams once or twice a year.  Routine eye exams. Ask your health care provider how often you should have your eyes checked.  Personal lifestyle choices, including:  Daily care of your teeth and gums.  Regular physical activity.  Eating a healthy diet.  Avoiding tobacco and drug use.  Limiting alcohol use.  Practicing safe sex.  Taking low-dose aspirin daily starting at age 28.  Taking vitamin and mineral supplements as recommended by your health care provider. What happens during an annual well check? The services and screenings done by your health care provider during your annual well check will depend on your age, overall health, lifestyle risk factors, and family history of disease. Counseling  Your health care provider may ask you questions about your:  Alcohol use.  Tobacco use.  Drug use.  Emotional well-being.  Home and relationship well-being.  Sexual activity.  Eating habits.  Work and work Statistician.  Method of birth control.  Menstrual cycle.  Pregnancy history. Screening  You may have the following tests or measurements:  Height, weight, and BMI.  Blood pressure.  Lipid and cholesterol levels. These may be checked every 5 years, or more frequently if you are over 45 years old.  Skin check.  Lung cancer screening. You may have this screening every year starting at age 16 if you have a 30-pack-year history of smoking and currently smoke or have quit within the past 15 years.  Fecal occult blood test (FOBT) of the stool. You  may have this test every year starting at age 88.  Flexible sigmoidoscopy or  colonoscopy. You may have a sigmoidoscopy every 5 years or a colonoscopy every 10 years starting at age 34.  Hepatitis C blood test.  Hepatitis B blood test.  Sexually transmitted disease (STD) testing.  Diabetes screening. This is done by checking your blood sugar (glucose) after you have not eaten for a while (fasting). You may have this done every 1-3 years.  Mammogram. This may be done every 1-2 years. Talk to your health care provider about when you should start having regular mammograms. This may depend on whether you have a family history of breast cancer.  BRCA-related cancer screening. This may be done if you have a family history of breast, ovarian, tubal, or peritoneal cancers.  Pelvic exam and Pap test. This may be done every 3 years starting at age 71. Starting at age 58, this may be done every 5 years if you have a Pap test in combination with an HPV test.  Bone density scan. This is done to screen for osteoporosis. You may have this scan if you are at high risk for osteoporosis. Discuss your test results, treatment options, and if necessary, the need for more tests with your health care provider. Vaccines  Your health care provider may recommend certain vaccines, such as:  Influenza vaccine. This is recommended every year.  Tetanus, diphtheria, and acellular pertussis (Tdap, Td) vaccine. You may need a Td booster every 10 years.  Zoster vaccine. You may need this after age 69.  Pneumococcal 13-valent conjugate (PCV13) vaccine. You may need this if you have certain conditions and were not previously vaccinated.  Pneumococcal polysaccharide (PPSV23) vaccine. You may need one or two doses if you smoke cigarettes or if you have certain conditions. Talk to your health care provider about which screenings and vaccines you need and how often you need them. This information is not intended to replace advice given to you by your health care provider. Make sure you discuss any  questions you have with your health care provider. Document Released: 03/04/2015 Document Revised: 10/26/2015 Document Reviewed: 12/07/2014 Elsevier Interactive Patient Education  2017 [place] Prevention in the Home Falls can cause injuries. They can happen to people of all ages. There are many things you can do to make your home safe and to help prevent falls. What can I do on the outside of my home?  Regularly fix the edges of walkways and driveways and fix any cracks.  Remove anything that might make you trip as you walk through a door, such as a raised step or threshold.  Trim any bushes or trees on the path to your home.  Use bright outdoor lighting.  Clear any walking paths of anything that might make someone trip, such as rocks or tools.  Regularly check to see if handrails are loose or broken. Make sure that both sides of any steps have handrails.  Any raised decks and porches should have guardrails on the edges.  Have any leaves, snow, or ice cleared regularly.  Use sand or salt on walking paths during winter.  Clean up any spills in your garage right away. This includes oil or grease spills. What can I do in the bathroom?  Use night lights.  Install grab bars by the toilet and in the tub and shower. Do not use towel bars as grab bars.  Use non-skid mats or decals in the tub or  shower.  If you need to sit down in the shower, use a plastic, non-slip stool.  Keep the floor dry. Clean up any water that spills on the floor as soon as it happens.  Remove soap buildup in the tub or shower regularly.  Attach bath mats securely with double-sided non-slip rug tape.  Do not have throw rugs and other things on the floor that can make you trip. What can I do in the bedroom?  Use night lights.  Make sure that you have a light by your bed that is easy to reach.  Do not use any sheets or blankets that are too big for your bed. They should not hang down  onto the floor.  Have a firm chair that has side arms. You can use this for support while you get dressed.  Do not have throw rugs and other things on the floor that can make you trip. What can I do in the kitchen?  Clean up any spills right away.  Avoid walking on wet floors.  Keep items that you use a lot in easy-to-reach places.  If you need to reach something above you, use a strong step stool that has a grab bar.  Keep electrical cords out of the way.  Do not use floor polish or wax that makes floors slippery. If you must use wax, use non-skid floor wax.  Do not have throw rugs and other things on the floor that can make you trip. What can I do with my stairs?  Do not leave any items on the stairs.  Make sure that there are handrails on both sides of the stairs and use them. Fix handrails that are broken or loose. Make sure that handrails are as long as the stairways.  Check any carpeting to make sure that it is firmly attached to the stairs. Fix any carpet that is loose or worn.  Avoid having throw rugs at the top or bottom of the stairs. If you do have throw rugs, attach them to the floor with carpet tape.  Make sure that you have a light switch at the top of the stairs and the bottom of the stairs. If you do not have them, ask someone to add them for you. What else can I do to help prevent falls?  Wear shoes that:  Do not have high heels.  Have rubber bottoms.  Are comfortable and fit you well.  Are closed at the toe. Do not wear sandals.  If you use a stepladder:  Make sure that it is fully opened. Do not climb a closed stepladder.  Make sure that both sides of the stepladder are locked into place.  Ask someone to hold it for you, if possible.  Clearly mark and make sure that you can see:  Any grab bars or handrails.  First and last steps.  Where the edge of each step is.  Use tools that help you move around (mobility aids) if they are needed. These  include:  Canes.  Walkers.  Scooters.  Crutches.  Turn on the lights when you go into a dark area. Replace any light bulbs as soon as they burn out.  Set up your furniture so you have a clear path. Avoid moving your furniture around.  If any of your floors are uneven, fix them.  If there are any pets around you, be aware of where they are.  Review your medicines with your doctor. Some medicines can make you feel  dizzy. This can increase your chance of falling. Ask your doctor what other things that you can do to help prevent falls. This information is not intended to replace advice given to you by your health care provider. Make sure you discuss any questions you have with your health care provider. Document Released: 12/02/2008 Document Revised: 07/14/2015 Document Reviewed: 03/12/2014 Elsevier Interactive Patient Education  2017 [employer].

## 2019-11-17 ENCOUNTER — Ambulatory Visit: Payer: Medicare Other | Admitting: Family Medicine

## 2019-11-23 DIAGNOSIS — N644 Mastodynia: Secondary | ICD-10-CM | POA: Diagnosis not present

## 2019-11-23 DIAGNOSIS — N6310 Unspecified lump in the right breast, unspecified quadrant: Secondary | ICD-10-CM | POA: Diagnosis not present

## 2019-11-24 ENCOUNTER — Other Ambulatory Visit: Payer: Self-pay | Admitting: Obstetrics and Gynecology

## 2019-11-24 DIAGNOSIS — N644 Mastodynia: Secondary | ICD-10-CM

## 2019-12-10 ENCOUNTER — Telehealth: Payer: Self-pay | Admitting: Family Medicine

## 2019-12-10 NOTE — Telephone Encounter (Signed)
Received imaging report from [facility] orthopedics.  MRI cervical spine November 30, 2015. C6-C7 small left central to posterior lateral protrusion substantially reduced in volume compared to prior study. Signs of interbody arthrodesis C4-5 and C5-6.  MRI cervical spine May 28, 2014. C6-C7 large left posterior lateral disc extrusion.  C4-5 and C5-6 no significant findings status post interbody arthrodesis  Will be sent to scan

## 2019-12-11 ENCOUNTER — Ambulatory Visit: Payer: Medicare Other

## 2019-12-11 ENCOUNTER — Other Ambulatory Visit: Payer: Self-pay

## 2019-12-11 ENCOUNTER — Ambulatory Visit
Admission: RE | Admit: 2019-12-11 | Discharge: 2019-12-11 | Disposition: A | Discharge status: Home / Self Care | Payer: Medicare Other | Source: Ambulatory Visit | Attending: Obstetrics and Gynecology | Admitting: Obstetrics and Gynecology

## 2019-12-11 DIAGNOSIS — R928 Other abnormal and inconclusive findings on diagnostic imaging of breast: Secondary | ICD-10-CM | POA: Diagnosis not present

## 2019-12-11 DIAGNOSIS — N644 Mastodynia: Secondary | ICD-10-CM

## 2020-01-04 DIAGNOSIS — Z0120 Encounter for dental examination and cleaning without abnormal findings: Secondary | ICD-10-CM | POA: Diagnosis not present

## 2020-01-20 ENCOUNTER — Encounter: Payer: Self-pay | Admitting: Internal Medicine

## 2020-01-20 MED ORDER — SUMATRIPTAN SUCCINATE 100 MG PO TABS
ORAL_TABLET | ORAL | 2 refills | Status: DC
Start: 2020-01-20 — End: 2020-08-09

## 2020-02-03 ENCOUNTER — Telehealth (INDEPENDENT_AMBULATORY_CARE_PROVIDER_SITE_OTHER): Payer: Medicare Other | Admitting: Internal Medicine

## 2020-02-03 DIAGNOSIS — F411 Generalized anxiety disorder: Secondary | ICD-10-CM | POA: Diagnosis not present

## 2020-02-03 DIAGNOSIS — E1165 Type 2 diabetes mellitus with hyperglycemia: Secondary | ICD-10-CM | POA: Diagnosis not present

## 2020-02-03 DIAGNOSIS — F32A Depression, unspecified: Secondary | ICD-10-CM | POA: Diagnosis not present

## 2020-02-03 MED ORDER — FLUOXETINE HCL 10 MG PO CAPS: 10.0000 mg | ORAL_CAPSULE | Freq: Every day | ORAL | 3 refills | Status: DC

## 2020-02-03 MED ORDER — CLONAZEPAM 0.5 MG PO TABS: 0.5000 mg | ORAL_TABLET | Freq: Two times a day (BID) | ORAL | 0 refills | Status: DC | PRN

## 2020-02-03 NOTE — Progress Notes (Signed)
Patient ID: Heather Dawson, female   DOB: 13-Jul-1955, 64 y.o.   MRN: [id number]  Virtual Visit via Video Note  I connected with Heather Dawson on 02/03/20 at  9:20 AM EST by a video enabled telemedicine application and verified that I am speaking with the correct person using two identifiers.  Location of all participants today Patient: at home with husband present Provider: at office   I discussed the limitations of evaluation and management by telemedicine and the availability of in person appointments. The patient expressed understanding and agreed to proceed.  History of Present Illness: 64 yo with hx of chronic LBP with claudication, DM, HTN, hld, CKD, GERD, hypothyroidism here now exposure to a family member in their home who has unfortunately left bed bug infestation.  Pt reports large increased anxiety and occasional feels overwhelmed, though no worsening depression, SI or HI.  Both he and wife are markedly stressed as they have tried unsuccessfully to tx the bed bug without success in the home, have been forced to isolate with no family time over the holidays, and in fact 3 family members have recently died as well (2 with covid infection).  Pt denies chest pain, increased sob or doe, wheezing, orthopnea, PND, increased LE swelling, palpitations, dizziness or syncope.  Pt denies new neurological symptoms such as new headache, or facial or extremity weakness or numbness   Pt denies polydipsia, polyuria despite gaining 20 lbs and spending over $2000 for bed bug tx.  Not sleeping well Past Medical History:  Diagnosis Date  . Abdominal pain, epigastric 12/20/2006  . ALLERGIC RHINITIS 12/25/2006  . Allergy   . ANXIETY 10/16/2006   no per pt  . Arthritis    right shoulder  . BACK PAIN 06/15/2008  . Blood transfusion without reported diagnosis   . BRADYCARDIA 05/04/2008  . C. difficile diarrhea   . Carpal tunnel syndrome 10/16/2006  . Cellulitis and abscess of other specified site 06/15/2008  .  CHEST PAIN 09/08/2009  . Chronic low back pain   . CIRRHOSIS 10/16/2006  . CKD (chronic kidney disease)    2008- had Ecoli and caused renal failure, no problems since then  . CKD (chronic kidney disease) stage 3, GFR 30-59 ml/min (HCC) 10/16/2006   Qualifier: Diagnosis of  By: Jenny Reichmann MD, [place] 12/20/2006  . Complication of anesthesia    awake during 2 surgeries  . CONTUSION, LOWER LEG 09/15/2008  . DEPRESSION 10/16/2006   no per pt  . Diabetes mellitus without complication ([place])   . Esophageal reflux 12/25/2006  . HYPERLIPIDEMIA 12/25/2006  . Hyperlipidemia   . HYPERTENSION 08/15/2006  . Hypertension   . HYPOTHYROIDISM 08/15/2006  . Kidney failure, acute ([place]) 2009   as a result of severe E Coli infection  . Left-sided carotid artery disease ([place])   . MIGRAINE HEADACHE 10/16/2006  . Obesity 10/16/2006  . OVARIAN CYST 12/20/2006  . Psoriasis 01/03/2011  . SHINGLES, HX OF 12/20/2006  . Stroke [facility])    3 strokes 2008-no deficits, only on ASA  . TRANSIENT ISCHEMIC ATTACKS, HX OF 12/20/2006  . VERTIGO 09/15/2007  . VITAMIN B12 DEFICIENCY 01/09/2007   Past Surgical History:  Procedure Laterality Date  . ABDOMINAL HYSTERECTOMY  02/2006  . ANTERIOR CERVICAL DECOMP/DISCECTOMY FUSION  2005  . CESAREAN SECTION    . CHOLECYSTECTOMY    . COLONOSCOPY    . ENDARTERECTOMY Left 08/25/2014   Procedure: LEFT CAROTID ENDARTERECTOMY WITH HEMASHIELD PATCH ANGIOPLASTY;  Surgeon:  Elam Dutch, MD;  Location: [place];  Service: Vascular;  Laterality: Left;  . NECK SURGERY  2005  . ROTATOR CUFF REPAIR Right 02/2014   Dr Veverly Fells    reports that she has quit smoking. She has never used smokeless tobacco. She reports that she does not drink alcohol and does not use drugs. family history includes Diabetes in her father, sister, and another family member; Heart disease in her brother, brother, father, and mother; Hypertension in her brother, brother and another family member; Lupus in her  mother; Raynaud syndrome in her mother; Stroke in her father. Allergies  Allergen Reactions  . Bactrim [Sulfamethoxazole-Trimethoprim] Nausea And Vomiting  . Cefuroxime Axetil Swelling     edema/swelling  . Ciprofloxacin Swelling  . Levaquin [Levofloxacin In D5w] Other (See Comments)    GI upset  . Lipitor [Atorvastatin] Other (See Comments)    Myalgia   . Lyrica [Pregabalin] Other (See Comments)    HA, confusion, swelling  . Macrodantin [Nitrofurantoin Macrocrystal] Other (See Comments)    GI upset, diarrhea and sob  . Gabapentin Rash   Current Outpatient Medications on File Prior to Visit  Medication Sig Dispense Refill  . acetaminophen (TYLENOL) 500 MG tablet Take 1,000 mg by mouth every 8 (eight) hours as needed (pain).    Marland Kitchen aspirin 325 MG EC tablet Take 325 mg by mouth daily.      . calcium carbonate (OSCAL) 1500 (600 Ca) MG TABS tablet calcium carbonate 600 mg calcium (1,500 mg) tablet    . Cholecalciferol (VITAMIN D3) 1000 units CAPS Take 2 capsules (2,000 Units total) by mouth daily. 60 capsule 0  . clobetasol (TEMOVATE) 0.05 % external solution Apply 1 application topically daily as needed (scalp psoriasis).   0  . diclofenac sodium (VOLTAREN) 1 % GEL Apply 4 g topically 4 (four) times daily as needed. 400 g 11  . hydrALAZINE (APRESOLINE) 50 MG tablet Take 1 tablet (50 mg total) by mouth 3 (three) times daily. 90 tablet 11  . ID NOW COVID-19 KIT See admin instructions.    Marland Kitchen levocetirizine (XYZAL) 5 MG tablet     . levothyroxine (SYNTHROID) 75 MCG tablet Take 1 tablet (75 mcg total) by mouth daily. 90 tablet 3  . lisinopril (ZESTRIL) 20 MG tablet Take 1 tablet (20 mg total) by mouth daily. 90 tablet 3  . lovastatin (MEVACOR) 40 MG tablet Take 2 tablets (80 mg total) by mouth at bedtime. 180 tablet 3  . mupirocin cream (BACTROBAN) 2 % Apply 1 application topically 2 (two) times daily.    . pantoprazole (PROTONIX) 40 MG tablet Take 1 tablet (40 mg total) by mouth daily. 90  tablet 3  . SUMAtriptan (IMITREX) 100 MG tablet TAKE 1 TABLET BY MOUTH EVERY 2 HOURS AS NEEDED FOR MIGRAINE OR HEADACHE, MAY REPEAT IN 2 HOURS IF HEADACHE PERSISTS OR RECURS 10 tablet 2  . triamcinolone cream (KENALOG) 0.1 % SMARTSIG:1 Application Topical 2-3 Times Daily    . vitamin B-12 (CYANOCOBALAMIN) 1000 MCG tablet Take 1 tablet (1,000 mcg total) by mouth daily. 90 tablet 1   No current facility-administered medications on file prior to visit.    Observations/Objective: Alert, NAD, appropriate mood and affect, resps normal, cn 2-12 intact, moves all 4s, no visible rash or swelling Lab Results  Component Value Date   WBC 5.7 09/29/2019   HGB 12.0 09/29/2019   HCT 36.8 09/29/2019   PLT 200 09/29/2019   GLUCOSE 96 07/03/2019   CHOL 154 09/29/2019  TRIG 78 09/29/2019   HDL 55 09/29/2019   LDLDIRECT 140.0 08/05/2018   LDLCALC 83 09/29/2019   ALT 15 09/29/2019   AST 17 09/29/2019   NA 138 07/03/2019   K 4.9 07/03/2019   CL 103 07/03/2019   CREATININE 1.66 (H) 07/03/2019   BUN 41 (H) 07/03/2019   CO2 27 07/03/2019   TSH 4.28 09/29/2019   INR 0.99 08/17/2014   HGBA1C 5.2 09/29/2019   MICROALBUR 3.3 (H) 05/01/2019   Assessment and Plan: See notes  Follow Up Instructions: See notes   I discussed the assessment and treatment plan with the patient. The patient was provided an opportunity to ask questions and all were answered. The patient agreed with the plan and demonstrated an understanding of the instructions.   The patient was advised to call back or seek an in-person evaluation if the symptoms worsen or if the condition fails to improve as anticipated.   [name], MD

## 2020-02-04 ENCOUNTER — Telehealth: Payer: Self-pay | Admitting: Internal Medicine

## 2020-02-04 NOTE — Telephone Encounter (Signed)
   Patient calling to report she had Pfizer booster vaccine this morning, she is now reporting severe abdominal pain, tingling around her mouth and mouth "drawing up" Call transferred to [facility]

## 2020-02-05 NOTE — Telephone Encounter (Signed)
[facility]   Caller states his wife has bad abdominal cramps, diarrhea,mouth feels tingling. got covid booster this morning at 945am.   [facility] advised: Go to ED Now  Patient understood and complied.   Patient rather talk to the pharmacist.

## 2020-02-05 NOTE — Telephone Encounter (Signed)
Sent to Dr. John. 

## 2020-02-18 ENCOUNTER — Encounter: Payer: Self-pay | Admitting: Internal Medicine

## 2020-02-18 NOTE — Assessment & Plan Note (Signed)
With much recent situational worsening, for prozac 10 qd restart, klonopin 0.5 bid prn, declines counseling or psychiatry referral  I spent total 34 minutes in caring for the patient for this visit:  1) by communicating with the patient and family/caregiver during the visit  2) by review of pertinent vital sign data, physical examination and labs as documented in the assessment and plan  3) by review of pertinent imaging - none today  4) by review of pertinent procedures - none today  5) by obtaining and reviewing separately obtained information from family/caretaker and Care Everywhere - none today  6) by ordering medications  7) by ordering tests - none today  8) by documenting all of this clinical information in the EHR including the management of each problem noted today in assessment and plan

## 2020-02-18 NOTE — Assessment & Plan Note (Signed)
Lab Results  Component Value Date   HGBA1C 5.2 09/29/2019   stable overall by history and exam, recent data reviewed with pt, and pt to continue medical treatment as before,  to f/u any worsening symptoms or concerns

## 2020-02-18 NOTE — Patient Instructions (Signed)
Please take all new medication as prescribed 

## 2020-02-18 NOTE — Assessment & Plan Note (Signed)
stable overall by history and exam, recent data reviewed with pt, and pt to continue medical treatment as before,  to f/u any worsening symptoms or concerns, declines psychiatry referral

## 2020-03-03 DIAGNOSIS — L400 Psoriasis vulgaris: Secondary | ICD-10-CM | POA: Diagnosis not present

## 2020-03-03 DIAGNOSIS — L438 Other lichen planus: Secondary | ICD-10-CM | POA: Diagnosis not present

## 2020-03-07 DIAGNOSIS — N183 Chronic kidney disease, stage 3 unspecified: Secondary | ICD-10-CM | POA: Diagnosis not present

## 2020-03-07 DIAGNOSIS — N179 Acute kidney failure, unspecified: Secondary | ICD-10-CM | POA: Diagnosis not present

## 2020-03-07 DIAGNOSIS — I129 Hypertensive chronic kidney disease with stage 1 through stage 4 chronic kidney disease, or unspecified chronic kidney disease: Secondary | ICD-10-CM | POA: Diagnosis not present

## 2020-03-07 DIAGNOSIS — E785 Hyperlipidemia, unspecified: Secondary | ICD-10-CM | POA: Diagnosis not present

## 2020-03-09 DIAGNOSIS — N183 Chronic kidney disease, stage 3 unspecified: Secondary | ICD-10-CM | POA: Diagnosis not present

## 2020-03-22 ENCOUNTER — Other Ambulatory Visit: Payer: Self-pay

## 2020-03-22 DIAGNOSIS — Z1231 Encounter for screening mammogram for malignant neoplasm of breast: Secondary | ICD-10-CM | POA: Diagnosis not present

## 2020-03-22 DIAGNOSIS — Z01419 Encounter for gynecological examination (general) (routine) without abnormal findings: Secondary | ICD-10-CM | POA: Diagnosis not present

## 2020-03-22 DIAGNOSIS — Z6832 Body mass index (BMI) 32.0-32.9, adult: Secondary | ICD-10-CM | POA: Diagnosis not present

## 2020-03-22 LAB — HM MAMMOGRAPHY

## 2020-03-23 ENCOUNTER — Ambulatory Visit (INDEPENDENT_AMBULATORY_CARE_PROVIDER_SITE_OTHER): Payer: Medicare Other | Admitting: Internal Medicine

## 2020-03-23 ENCOUNTER — Encounter: Payer: Self-pay | Admitting: Internal Medicine

## 2020-03-23 VITALS — BP 158/90 | HR 71 | Temp 98.2°F | Ht 62.0 in | Wt 178.0 lb

## 2020-03-23 DIAGNOSIS — I10 Essential (primary) hypertension: Secondary | ICD-10-CM | POA: Diagnosis not present

## 2020-03-23 DIAGNOSIS — R519 Headache, unspecified: Secondary | ICD-10-CM

## 2020-03-23 DIAGNOSIS — G459 Transient cerebral ischemic attack, unspecified: Secondary | ICD-10-CM | POA: Diagnosis not present

## 2020-03-23 DIAGNOSIS — Z23 Encounter for immunization: Secondary | ICD-10-CM | POA: Diagnosis not present

## 2020-03-23 DIAGNOSIS — F411 Generalized anxiety disorder: Secondary | ICD-10-CM | POA: Diagnosis not present

## 2020-03-23 MED ORDER — HYDRALAZINE HCL 100 MG PO TABS: 100.0000 mg | ORAL_TABLET | Freq: Two times a day (BID) | ORAL | 11 refills | Status: DC

## 2020-03-23 NOTE — Patient Instructions (Addendum)
You had the flu shot today  Ok to increase the hydralazine to 100 mg three times per day  Please continue all other medications as before, and refills have been done if requested.  Please have the pharmacy call with any other refills you may need.  Please keep your appointments with your specialists as you may have planned  You will be contacted regarding the referral for: MRI, and Dr Leonie Man  Please make an Appointment to return in 3 months

## 2020-03-23 NOTE — Progress Notes (Signed)
Established Patient Office Visit  Subjective:  Patient ID: Heather Dawson, female    DOB: 1955/09/15  Age: 65 y.o. MRN: [id number]       Chief Complaint: follow up HTN, left facial pain, and possible TIA like symptoms       HPI:  Heather Dawson is a 65 y.o. female here with c/o persistent recent elevated BPs in the 160s in the AM, doesnot check later in the day, despite good compliance with meds. Pt denies chest pain, increased sob or doe, wheezing, orthopnea, PND, increased LE swelling, palpitations, dizziness or syncope.  Pt denies new neurological symptoms such as new headache, or facial or extremity weakness or numbness, but did have an episode at church jan 30 of 90 min sudden onset confusion in that she found herself difficult to comprehend what was going on and what she was doing while singing in the choir, assoc with some blurred vision, but all resolved.  She did not seek medical attention as this meant leaving the service from the choir and symptoms resolved soon after.  No recurrence.  Does also mention ongoing now mild stress and depression symptoms, had to stop the prozac after only 1 wk as she felt improved from the situational severe symptoms of the recent tele health visit.  Also in addtion c/o left facial pain x 2 wks tingling hypersensiitive moderate starting in front of the ear involving the left face and temple area, worse to chew or talk or touch the area, also worse to brush the teeth, fortnately this has now waned off as well it seems.  Due for flu shot .        Wt Readings from Last 3 Encounters:  03/23/20 178 lb (80.7 kg)  10/30/19 161 lb (73 kg)  10/01/19 161 lb (73 kg)   BP Readings from Last 3 Encounters:  03/23/20 (!) 158/90  10/30/19 130/80  10/01/19 130/84         Past Medical History:  Diagnosis Date  . Abdominal pain, epigastric 12/20/2006  . ALLERGIC RHINITIS 12/25/2006  . Allergy   . ANXIETY 10/16/2006   no per pt  . Arthritis    right shoulder  . BACK  PAIN 06/15/2008  . Blood transfusion without reported diagnosis   . BRADYCARDIA 05/04/2008  . C. difficile diarrhea   . Carpal tunnel syndrome 10/16/2006  . Cellulitis and abscess of other specified site 06/15/2008  . CHEST PAIN 09/08/2009  . Chronic low back pain   . CIRRHOSIS 10/16/2006  . CKD (chronic kidney disease)    2008- had Ecoli and caused renal failure, no problems since then  . CKD (chronic kidney disease) stage 3, GFR 30-59 ml/min (HCC) 10/16/2006   Qualifier: Diagnosis of  By: Jenny Reichmann MD, [place] 12/20/2006  . Complication of anesthesia    awake during 2 surgeries  . CONTUSION, LOWER LEG 09/15/2008  . DEPRESSION 10/16/2006   no per pt  . Diabetes mellitus without complication ([place])   . Esophageal reflux 12/25/2006  . HYPERLIPIDEMIA 12/25/2006  . Hyperlipidemia   . HYPERTENSION 08/15/2006  . Hypertension   . HYPOTHYROIDISM 08/15/2006  . Kidney failure, acute ([place]) 2009   as a result of severe E Coli infection  . Left-sided carotid artery disease ([place])   . MIGRAINE HEADACHE 10/16/2006  . Obesity 10/16/2006  . OVARIAN CYST 12/20/2006  . Psoriasis 01/03/2011  . SHINGLES, HX OF 12/20/2006  . Stroke [facility])    3 strokes  2008-no deficits, only on ASA  . TRANSIENT ISCHEMIC ATTACKS, HX OF 12/20/2006  . VERTIGO 09/15/2007  . VITAMIN B12 DEFICIENCY 01/09/2007   Past Surgical History:  Procedure Laterality Date  . ABDOMINAL HYSTERECTOMY  02/2006  . ANTERIOR CERVICAL DECOMP/DISCECTOMY FUSION  2005  . CESAREAN SECTION    . CHOLECYSTECTOMY    . COLONOSCOPY    . ENDARTERECTOMY Left 08/25/2014   Procedure: LEFT CAROTID ENDARTERECTOMY WITH HEMASHIELD PATCH ANGIOPLASTY;  Surgeon: Elam Dutch, MD;  Location: [facility];  Service: Vascular;  Laterality: Left;  . NECK SURGERY  2005  . ROTATOR CUFF REPAIR Right 02/2014   Dr Veverly Fells    reports that she has quit smoking. She has never used smokeless tobacco. She reports that she does not drink alcohol and does not use  drugs. family history includes Diabetes in her father, sister, and another family member; Heart disease in her brother, brother, father, and mother; Hypertension in her brother, brother and another family member; Lupus in her mother; Raynaud syndrome in her mother; Stroke in her father. Allergies  Allergen Reactions  . Bactrim [Sulfamethoxazole-Trimethoprim] Nausea And Vomiting  . Cefuroxime Axetil Swelling     edema/swelling  . Ciprofloxacin Swelling  . Levaquin [Levofloxacin In D5w] Other (See Comments)    GI upset  . Lipitor [Atorvastatin] Other (See Comments)    Myalgia   . Lyrica [Pregabalin] Other (See Comments)    HA, confusion, swelling  . Macrodantin [Nitrofurantoin Macrocrystal] Other (See Comments)    GI upset, diarrhea and sob  . Gabapentin Rash   Current Outpatient Medications on File Prior to Visit  Medication Sig Dispense Refill  . acetaminophen (TYLENOL) 500 MG tablet Take 1,000 mg by mouth every 8 (eight) hours as needed (pain).    Marland Kitchen aspirin 325 MG EC tablet Take 325 mg by mouth daily.    . calcium carbonate (OSCAL) 1500 (600 Ca) MG TABS tablet calcium carbonate 600 mg calcium (1,500 mg) tablet    . Cholecalciferol (VITAMIN D3) 1000 units CAPS Take 2 capsules (2,000 Units total) by mouth daily. 60 capsule 0  . clobetasol (TEMOVATE) 0.05 % external solution Apply 1 application topically daily as needed (scalp psoriasis).   0  . diclofenac sodium (VOLTAREN) 1 % GEL Apply 4 g topically 4 (four) times daily as needed. 400 g 11  . FLUoxetine (PROZAC) 10 MG capsule Take 1 capsule (10 mg total) by mouth daily. 90 capsule 3  . ID NOW COVID-19 KIT See admin instructions.    Marland Kitchen levocetirizine (XYZAL) 5 MG tablet     . levothyroxine (SYNTHROID) 75 MCG tablet Take 1 tablet (75 mcg total) by mouth daily. 90 tablet 3  . lisinopril (ZESTRIL) 20 MG tablet Take 1 tablet (20 mg total) by mouth daily. 90 tablet 3  . lovastatin (MEVACOR) 40 MG tablet Take 2 tablets (80 mg total) by mouth  at bedtime. 180 tablet 3  . mupirocin cream (BACTROBAN) 2 % Apply 1 application topically 2 (two) times daily.    . pantoprazole (PROTONIX) 40 MG tablet Take 1 tablet (40 mg total) by mouth daily. 90 tablet 3  . SUMAtriptan (IMITREX) 100 MG tablet TAKE 1 TABLET BY MOUTH EVERY 2 HOURS AS NEEDED FOR MIGRAINE OR HEADACHE, MAY REPEAT IN 2 HOURS IF HEADACHE PERSISTS OR RECURS 10 tablet 2  . triamcinolone cream (KENALOG) 0.1 % SMARTSIG:1 Application Topical 2-3 Times Daily    . clonazePAM (KLONOPIN) 0.5 MG tablet Take 1 tablet (0.5 mg total) by mouth 2 (two)  times daily as needed for anxiety. (Patient not taking: Reported on 03/23/2020) 60 tablet 0  . vitamin B-12 (CYANOCOBALAMIN) 1000 MCG tablet Take 1 tablet (1,000 mcg total) by mouth daily. 90 tablet 1   No current facility-administered medications on file prior to visit.        ROS:  All others reviewed and negative.  Objective        PE:  BP (!) 158/90   Pulse 71   Temp 98.2 F (36.8 C) (Oral)   Ht 5' 2"  (1.575 m)   Wt 178 lb (80.7 kg)   SpO2 98%   BMI 32.56 kg/m                 Constitutional: Pt appears in NAD               HENT: Head: NCAT.                Right Ear: External ear normal.                 Left Ear: External ear normal.                Eyes: . Pupils are equal, round, and reactive to light. Conjunctivae and EOM are normal               Nose: without d/c or deformity               Neck: Neck supple. Gross normal ROM               Cardiovascular: Normal rate and regular rhythm.                 Pulmonary/Chest: Effort normal and breath sounds without rales or wheezing.                Abd:  Soft, NT, ND, + BS, no organomegaly               Neurological: Pt is alert. At baseline orientation, motor grossly intact, cn 2-12 intact               Skin: Skin is warm. No rashes, no other new lesions, LE edema - none               Psychiatric: Pt behavior is normal without agitation   Micro: none  Cardiac tracings I have  personally interpreted today:  none  Pertinent Radiological findings (summarize): none   Lab Results  Component Value Date   WBC 5.7 09/29/2019   HGB 12.0 09/29/2019   HCT 36.8 09/29/2019   PLT 200 09/29/2019   GLUCOSE 96 07/03/2019   CHOL 154 09/29/2019   TRIG 78 09/29/2019   HDL 55 09/29/2019   LDLDIRECT 140.0 08/05/2018   LDLCALC 83 09/29/2019   ALT 15 09/29/2019   AST 17 09/29/2019   NA 138 07/03/2019   K 4.9 07/03/2019   CL 103 07/03/2019   CREATININE 1.66 (H) 07/03/2019   BUN 41 (H) 07/03/2019   CO2 27 07/03/2019   TSH 4.28 09/29/2019   INR 0.99 08/17/2014   HGBA1C 5.2 09/29/2019   MICROALBUR 3.3 (H) 05/01/2019   Assessment/Plan:  Heather Dawson is a 65 y.o. White or Caucasian [1] female with  has a past medical history of Abdominal pain, epigastric (12/20/2006), ALLERGIC RHINITIS (12/25/2006), Allergy, ANXIETY (10/16/2006), Arthritis, BACK PAIN (06/15/2008), Blood transfusion without reported diagnosis, BRADYCARDIA (05/04/2008), C. difficile diarrhea, Carpal tunnel syndrome (10/16/2006), Cellulitis and abscess  of other specified site (06/15/2008), CHEST PAIN (09/08/2009), Chronic low back pain, CIRRHOSIS (10/16/2006), CKD (chronic kidney disease), CKD (chronic kidney disease) stage 3, GFR 30-59 ml/min (HCC) (10/16/2006), COMMON MIGRAINE (94/76/5465), Complication of anesthesia, CONTUSION, LOWER LEG (09/15/2008), DEPRESSION (10/16/2006), Diabetes mellitus without complication ([place]), Esophageal reflux (12/25/2006), HYPERLIPIDEMIA (12/25/2006), Hyperlipidemia, HYPERTENSION (08/15/2006), Hypertension, HYPOTHYROIDISM (08/15/2006), Kidney failure, acute ([place]) (2009), Left-sided carotid artery disease ([place]), MIGRAINE HEADACHE (10/16/2006), Obesity (10/16/2006), OVARIAN CYST (12/20/2006), Psoriasis (01/03/2011), SHINGLES, HX OF (12/20/2006), Stroke ([place]), TRANSIENT ISCHEMIC ATTACKS, HX OF (12/20/2006), VERTIGO (09/15/2007), and VITAMIN B12 DEFICIENCY (01/09/2007).  Transient cerebral ischemia I suspect  possible TIA as the etiology of the 90 min church related symptoms; but unclear, to continue asa 325 and statin, for MRI and refer Dr Sethi/neurology   Essential hypertension BP Readings from Last 3 Encounters:  03/23/20 (!) 158/90  10/30/19 130/80  10/01/19 130/84   Mod to severe uncontrolled, for low salt, wt loss, exercise, pt to continue medical treatment lisinopril 20 (no increase due to renal), and increase the hydralazine 100 tid, follow BP t home and next visit  Current Outpatient Medications (Endocrine & Metabolic):  .  levothyroxine (SYNTHROID) 75 MCG tablet, Take 1 tablet (75 mcg total) by mouth daily.  Current Outpatient Medications (Cardiovascular):  .  hydrALAZINE (APRESOLINE) 100 MG tablet, Take 1 tablet (100 mg total) by mouth 2 (two) times daily. Marland Kitchen  lisinopril (ZESTRIL) 20 MG tablet, Take 1 tablet (20 mg total) by mouth daily. Marland Kitchen  lovastatin (MEVACOR) 40 MG tablet, Take 2 tablets (80 mg total) by mouth at bedtime.  Current Outpatient Medications (Respiratory):  .  levocetirizine (XYZAL) 5 MG tablet,   Current Outpatient Medications (Analgesics):  .  acetaminophen (TYLENOL) 500 MG tablet, Take 1,000 mg by mouth every 8 (eight) hours as needed (pain). Marland Kitchen  aspirin 325 MG EC tablet, Take 325 mg by mouth daily. .  SUMAtriptan (IMITREX) 100 MG tablet, TAKE 1 TABLET BY MOUTH EVERY 2 HOURS AS NEEDED FOR MIGRAINE OR HEADACHE, MAY REPEAT IN 2 HOURS IF HEADACHE PERSISTS OR RECURS  Current Outpatient Medications (Hematological):  .  vitamin B-12 (CYANOCOBALAMIN) 1000 MCG tablet, Take 1 tablet (1,000 mcg total) by mouth daily.  Current Outpatient Medications (Other):  .  calcium carbonate (OSCAL) 1500 (600 Ca) MG TABS tablet, calcium carbonate 600 mg calcium (1,500 mg) tablet .  Cholecalciferol (VITAMIN D3) 1000 units CAPS, Take 2 capsules (2,000 Units total) by mouth daily. .  clobetasol (TEMOVATE) 0.05 % external solution, Apply 1 application topically daily as needed (scalp  psoriasis).  Marland Kitchen  diclofenac sodium (VOLTAREN) 1 % GEL, Apply 4 g topically 4 (four) times daily as needed. Marland Kitchen  FLUoxetine (PROZAC) 10 MG capsule, Take 1 capsule (10 mg total) by mouth daily. .  ID NOW COVID-19 KIT, See admin instructions. .  mupirocin cream (BACTROBAN) 2 %, Apply 1 application topically 2 (two) times daily. .  pantoprazole (PROTONIX) 40 MG tablet, Take 1 tablet (40 mg total) by mouth daily. Marland Kitchen  triamcinolone cream (KENALOG) 0.1 %, SMARTSIG:1 Application Topical 2-3 Times Daily .  clonazePAM (KLONOPIN) 0.5 MG tablet, Take 1 tablet (0.5 mg total) by mouth 2 (two) times daily as needed for anxiety. (Patient not taking: Reported on 03/23/2020) .  diazepam (VALIUM) 5 MG tablet, 1 tab by mouth approx 1 hour prior to the MRI   Left-sided face pain Symptoms improved and exam benign, but suspet possible mild trigeminal neuralgia type neuritic pain vs msk vs TMJ; will continue to follow  Anxiety state Pt  is trying to minimize meds and has stopped the prozac; I suggested given her ongoing physical issues and stressors at home she may wish to restart but declines for now  Followup: Return in about 1 month (around 04/20/2020).  [name], MD 03/27/2020 7:22 PM [facility]

## 2020-03-24 ENCOUNTER — Telehealth: Payer: Self-pay | Admitting: Internal Medicine

## 2020-03-24 MED ORDER — DIAZEPAM 5 MG PO TABS: ORAL_TABLET | ORAL | 0 refills | Status: DC

## 2020-03-24 NOTE — Telephone Encounter (Signed)
See below

## 2020-03-24 NOTE — Telephone Encounter (Signed)
Ok for valium 5 mg x 1 to take 1 hour prior - done erx

## 2020-03-24 NOTE — Telephone Encounter (Signed)
    Patient calling to request medication to take prior to MRI scheduled for 2/4. She states she is anxious and [facility] [address] [place]

## 2020-03-25 ENCOUNTER — Telehealth: Payer: Self-pay

## 2020-03-25 ENCOUNTER — Other Ambulatory Visit: Payer: Self-pay

## 2020-03-25 ENCOUNTER — Ambulatory Visit
Admission: RE | Admit: 2020-03-25 | Discharge: 2020-03-25 | Disposition: A | Discharge status: Home / Self Care | Payer: Medicare Other | Source: Ambulatory Visit | Attending: Internal Medicine | Admitting: Internal Medicine

## 2020-03-25 DIAGNOSIS — G930 Cerebral cysts: Secondary | ICD-10-CM | POA: Diagnosis not present

## 2020-03-25 DIAGNOSIS — Z8673 Personal history of transient ischemic attack (TIA), and cerebral infarction without residual deficits: Secondary | ICD-10-CM | POA: Diagnosis not present

## 2020-03-25 DIAGNOSIS — G459 Transient cerebral ischemic attack, unspecified: Secondary | ICD-10-CM

## 2020-03-25 DIAGNOSIS — G9389 Other specified disorders of brain: Secondary | ICD-10-CM | POA: Diagnosis not present

## 2020-03-26 ENCOUNTER — Encounter: Payer: Self-pay | Admitting: Internal Medicine

## 2020-03-27 ENCOUNTER — Encounter: Payer: Self-pay | Admitting: Internal Medicine

## 2020-03-27 DIAGNOSIS — R519 Headache, unspecified: Secondary | ICD-10-CM | POA: Insufficient documentation

## 2020-03-27 NOTE — Assessment & Plan Note (Signed)
BP Readings from Last 3 Encounters:  03/23/20 (!) 158/90  10/30/19 130/80  10/01/19 130/84   Mod to severe uncontrolled, for low salt, wt loss, exercise, pt to continue medical treatment lisinopril 20 (no increase due to renal), and increase the hydralazine 100 tid, follow BP t home and next visit  Current Outpatient Medications (Endocrine & Metabolic):  .  levothyroxine (SYNTHROID) 75 MCG tablet, Take 1 tablet (75 mcg total) by mouth daily.  Current Outpatient Medications (Cardiovascular):  .  hydrALAZINE (APRESOLINE) 100 MG tablet, Take 1 tablet (100 mg total) by mouth 2 (two) times daily. Marland Kitchen  lisinopril (ZESTRIL) 20 MG tablet, Take 1 tablet (20 mg total) by mouth daily. Marland Kitchen  lovastatin (MEVACOR) 40 MG tablet, Take 2 tablets (80 mg total) by mouth at bedtime.  Current Outpatient Medications (Respiratory):  .  levocetirizine (XYZAL) 5 MG tablet,   Current Outpatient Medications (Analgesics):  .  acetaminophen (TYLENOL) 500 MG tablet, Take 1,000 mg by mouth every 8 (eight) hours as needed (pain). Marland Kitchen  aspirin 325 MG EC tablet, Take 325 mg by mouth daily. .  SUMAtriptan (IMITREX) 100 MG tablet, TAKE 1 TABLET BY MOUTH EVERY 2 HOURS AS NEEDED FOR MIGRAINE OR HEADACHE, MAY REPEAT IN 2 HOURS IF HEADACHE PERSISTS OR RECURS  Current Outpatient Medications (Hematological):  .  vitamin B-12 (CYANOCOBALAMIN) 1000 MCG tablet, Take 1 tablet (1,000 mcg total) by mouth daily.  Current Outpatient Medications (Other):  .  calcium carbonate (OSCAL) 1500 (600 Ca) MG TABS tablet, calcium carbonate 600 mg calcium (1,500 mg) tablet .  Cholecalciferol (VITAMIN D3) 1000 units CAPS, Take 2 capsules (2,000 Units total) by mouth daily. .  clobetasol (TEMOVATE) 0.05 % external solution, Apply 1 application topically daily as needed (scalp psoriasis).  Marland Kitchen  diclofenac sodium (VOLTAREN) 1 % GEL, Apply 4 g topically 4 (four) times daily as needed. Marland Kitchen  FLUoxetine (PROZAC) 10 MG capsule, Take 1 capsule (10 mg total) by mouth  daily. .  ID NOW COVID-19 KIT, See admin instructions. .  mupirocin cream (BACTROBAN) 2 %, Apply 1 application topically 2 (two) times daily. .  pantoprazole (PROTONIX) 40 MG tablet, Take 1 tablet (40 mg total) by mouth daily. Marland Kitchen  triamcinolone cream (KENALOG) 0.1 %, SMARTSIG:1 Application Topical 2-3 Times Daily .  clonazePAM (KLONOPIN) 0.5 MG tablet, Take 1 tablet (0.5 mg total) by mouth 2 (two) times daily as needed for anxiety. (Patient not taking: Reported on 03/23/2020) .  diazepam (VALIUM) 5 MG tablet, 1 tab by mouth approx 1 hour prior to the MRI

## 2020-03-27 NOTE — Assessment & Plan Note (Addendum)
I suspect possible TIA as the etiology of the 90 min church related symptoms; but unclear, to continue asa 325 and statin, for MRI and refer Dr Sethi/neurology

## 2020-03-27 NOTE — Assessment & Plan Note (Signed)
Symptoms improved and exam benign, but suspet possible mild trigeminal neuralgia type neuritic pain vs msk vs TMJ; will continue to follow

## 2020-03-27 NOTE — Assessment & Plan Note (Signed)
Pt is trying to minimize meds and has stopped the prozac; I suggested given her ongoing physical issues and stressors at home she may wish to restart but declines for now

## 2020-03-30 ENCOUNTER — Encounter: Payer: Self-pay | Admitting: Internal Medicine

## 2020-03-31 ENCOUNTER — Telehealth: Payer: Self-pay | Admitting: Neurology

## 2020-03-31 ENCOUNTER — Encounter: Payer: Self-pay | Admitting: Neurology

## 2020-03-31 ENCOUNTER — Ambulatory Visit: Payer: Medicare Other | Admitting: Neurology

## 2020-03-31 VITALS — BP 165/71 | Ht 62.0 in | Wt 175.8 lb

## 2020-03-31 DIAGNOSIS — R410 Disorientation, unspecified: Secondary | ICD-10-CM | POA: Diagnosis not present

## 2020-03-31 DIAGNOSIS — G459 Transient cerebral ischemic attack, unspecified: Secondary | ICD-10-CM | POA: Diagnosis not present

## 2020-03-31 DIAGNOSIS — R413 Other amnesia: Secondary | ICD-10-CM | POA: Diagnosis not present

## 2020-03-31 NOTE — Progress Notes (Signed)
[facility] [address] [place]. [place] [address] [phone]       OFFICE CONSULT NOTE  Heather Dawson Date of Birth:  20-Mar-1955 Medical Record Number:  [id number]   Referring MD: [name]  Reason for Referral: Confusion  HPI: [name] is a 65 year old pleasant Caucasian lady seen today for initial office consultation visit.  She is accompanied by her husband.  History is obtained from them and review of referral notes and electronic medical records.  I have personally reviewed pertinent imaging films in PACS.  She has extensive past medical history including hypertension, diabetes hyperlipidemia, prior TIAs and strokes in 2008 and anxiety, arthritis, chronic kidney disease and depression.  She states that on 03/21/2019 she had an episode of sudden onset of confusion, blurred vision, decreased comprehension and difficulty with focusing and seeing she did not have any accompanying headache, diplopia or vertigo.  She was able to walk all right.  She was at her church in choir practice where she had trouble comprehending what was going on around her and she was singing and noticed that the vision was all blurred.  She did not seek immediate medical help persistent but left required symptoms resolved in about 30 to 90 minutes.  She did not have a headache after that.  She denies any slurred speech or gait or balance difficulties.  She subsequently saw primary physician Dr. Jenny Reichmann who ordered an outpatient MRI was done on 03/25/2020 which shows no acute abnormality but shows old remote infarct in the right posterior frontoparietal region.  Another area of CSF density wedge-shaped lesion is noted in the left cerebellum cord infarct but could be a dilated CSF sulcus.  Patient's blood pressure has been quite high recently and Dr. Jenny Reichmann has increased the hydralazine dose from 5200 mg twice daily but yet it is 165/71 today.  She denies any history of loss of consciousness, head injury,  seizure.  She has history of previous TIAs and strokes in 2008 when she had seen me with she had not followed up since then.  She states she is compliant aspirin and except her recent blood pressure increase she had been doing quite well.  Her sugars cholesterol has been under good control but they have not been recently checked.  ROS:   14 system review of systems is positive for memory loss, blurred vision, difficulty focusing, decreased comprehension, confusion, TIA and all other systems negative PMH:  Past Medical History:  Diagnosis Date  . Abdominal pain, epigastric 12/20/2006  . ALLERGIC RHINITIS 12/25/2006  . Allergy   . ANXIETY 10/16/2006   no per pt  . Arthritis    right shoulder  . BACK PAIN 06/15/2008  . Blood transfusion without reported diagnosis   . BRADYCARDIA 05/04/2008  . C. difficile diarrhea   . Carpal tunnel syndrome 10/16/2006  . Cellulitis and abscess of other specified site 06/15/2008  . CHEST PAIN 09/08/2009  . Chronic low back pain   . CIRRHOSIS 10/16/2006  . CKD (chronic kidney disease)    2008- had Ecoli and caused renal failure, no problems since then  . CKD (chronic kidney disease) stage 3, GFR 30-59 ml/min (HCC) 10/16/2006   Qualifier: Diagnosis of  By: Jenny Reichmann MD, [place] 12/20/2006  . Complication of anesthesia    awake during 2 surgeries  . CONTUSION, LOWER LEG 09/15/2008  . DEPRESSION 10/16/2006   no per pt  . Diabetes mellitus without complication ([place])   .  Esophageal reflux 12/25/2006  . HYPERLIPIDEMIA 12/25/2006  . Hyperlipidemia   . HYPERTENSION 08/15/2006  . Hypertension   . HYPOTHYROIDISM 08/15/2006  . Kidney failure, acute ([place]) 2009   as a result of severe E Coli infection  . Left-sided carotid artery disease ([place])   . MIGRAINE HEADACHE 10/16/2006  . Obesity 10/16/2006  . OVARIAN CYST 12/20/2006  . Psoriasis 01/03/2011  . SHINGLES, HX OF 12/20/2006  . Stroke [facility])    3 strokes 2008-no deficits, only on ASA  . TRANSIENT  ISCHEMIC ATTACKS, HX OF 12/20/2006  . VERTIGO 09/15/2007  . VITAMIN B12 DEFICIENCY 01/09/2007    Social History:  Social History   Socioeconomic History  . Marital status: Married    Spouse name: [name]  . Number of children: Not on file  . Years of education: Not on file  . Highest education level: Not on file  Occupational History  . Occupation: retired  Tobacco Use  . Smoking status: Former Research scientist (life sciences)  . Smokeless tobacco: Never Used  . Tobacco comment: quit smoking over 30 years ago  Vaping Use  . Vaping Use: Never used  Substance and Sexual Activity  . Alcohol use: No  . Drug use: No  . Sexual activity: Not on file  Other Topics Concern  . Not on file  Social History Narrative   Lives with husband   Right Handed   Drinks 2-4 cups coffee   Social Determinants of Health   Financial Resource Strain: Low Risk   . Difficulty of Paying Living Expenses: Not hard at all  Food Insecurity: No Food Insecurity  . Worried About Charity fundraiser in the Last Year: Never true  . Ran Out of Food in the Last Year: Never true  Transportation Needs: No Transportation Needs  . Lack of Transportation (Medical): No  . Lack of Transportation (Non-Medical): No  Physical Activity: Sufficiently Active  . Days of Exercise per Week: 7 days  . Minutes of Exercise per Session: 30 min  Stress: No Stress Concern Present  . Feeling of Stress : Not at all  Social Connections: Socially Integrated  . Frequency of Communication with Friends and Family: More than three times a week  . Frequency of Social Gatherings with Friends and Family: More than three times a week  . Attends Religious Services: More than 4 times per year  . Active Member of Clubs or Organizations: Yes  . Attends Archivist Meetings: More than 4 times per year  . Marital Status: Married  Human resources officer Violence: Not on file    Medications:   Current Outpatient Medications on File Prior to Visit  Medication Sig  Dispense Refill  . acetaminophen (TYLENOL) 500 MG tablet Take 1,000 mg by mouth every 8 (eight) hours as needed (pain).    Marland Kitchen aspirin 325 MG EC tablet Take 325 mg by mouth daily.    . calcium carbonate (OSCAL) 1500 (600 Ca) MG TABS tablet calcium carbonate 600 mg calcium (1,500 mg) tablet    . clobetasol (TEMOVATE) 0.05 % external solution Apply 1 application topically daily as needed (scalp psoriasis).   0  . diclofenac sodium (VOLTAREN) 1 % GEL Apply 4 g topically 4 (four) times daily as needed. 400 g 11  . hydrALAZINE (APRESOLINE) 100 MG tablet Take 1 tablet (100 mg total) by mouth 2 (two) times daily. 90 tablet 11  . levocetirizine (XYZAL) 5 MG tablet     . levothyroxine (SYNTHROID) 75 MCG tablet Take 1 tablet (75  mcg total) by mouth daily. 90 tablet 3  . lisinopril (ZESTRIL) 20 MG tablet Take 1 tablet (20 mg total) by mouth daily. 90 tablet 3  . lovastatin (MEVACOR) 40 MG tablet Take 2 tablets (80 mg total) by mouth at bedtime. 180 tablet 3  . mupirocin cream (BACTROBAN) 2 % Apply 1 application topically 2 (two) times daily.    . pantoprazole (PROTONIX) 40 MG tablet Take 1 tablet (40 mg total) by mouth daily. 90 tablet 3  . SUMAtriptan (IMITREX) 100 MG tablet TAKE 1 TABLET BY MOUTH EVERY 2 HOURS AS NEEDED FOR MIGRAINE OR HEADACHE, MAY REPEAT IN 2 HOURS IF HEADACHE PERSISTS OR RECURS 10 tablet 2  . triamcinolone cream (KENALOG) 0.1 % SMARTSIG:1 Application Topical 2-3 Times Daily    . Cholecalciferol (VITAMIN D3) 1000 units CAPS Take 2 capsules (2,000 Units total) by mouth daily. 60 capsule 0  . clonazePAM (KLONOPIN) 0.5 MG tablet Take 1 tablet (0.5 mg total) by mouth 2 (two) times daily as needed for anxiety. 60 tablet 0  . diazepam (VALIUM) 5 MG tablet 1 tab by mouth approx 1 hour prior to the MRI 1 tablet 0  . FLUoxetine (PROZAC) 10 MG capsule Take 1 capsule (10 mg total) by mouth daily. 90 capsule 3  . ID NOW COVID-19 KIT See admin instructions.    . vitamin B-12 (CYANOCOBALAMIN) 1000 MCG  tablet Take 1 tablet (1,000 mcg total) by mouth daily. 90 tablet 1   No current facility-administered medications on file prior to visit.    Allergies:   Allergies  Allergen Reactions  . Bactrim [Sulfamethoxazole-Trimethoprim] Nausea And Vomiting  . Cefuroxime Axetil Swelling     edema/swelling  . Ciprofloxacin Swelling  . Levaquin [Levofloxacin In D5w] Other (See Comments)    GI upset  . Lipitor [Atorvastatin] Other (See Comments)    Myalgia   . Lyrica [Pregabalin] Other (See Comments)    HA, confusion, swelling  . Macrodantin [Nitrofurantoin Macrocrystal] Other (See Comments)    GI upset, diarrhea and sob  . Gabapentin Rash    Physical Exam General: Mildly obese elderly Caucasian lady, seated, in no evident distress Head: head normocephalic and atraumatic.   Neck: supple with no carotid or supraclavicular bruits Cardiovascular: regular rate and rhythm, no murmurs Musculoskeletal: no deformity Skin:  no rash/petichiae Vascular:  Normal pulses all extremities  Neurologic Exam Mental Status: Awake and fully alert. Oriented to place and time. Recent and remote memory mildly diminished. Attention span, concentration and fund of knowledge slightly diminished. Mood and affect appropriate.  Diminished recall 2/3.  Able to name only 9 animals which can walk on 4 legs.  Clock drawing 4/4.  Able to copy intersecting pentagons well. Cranial Nerves: Fundoscopic exam reveals sharp disc margins. Pupils equal, briskly reactive to light. Extraocular movements full without nystagmus. Visual fields full to confrontation. Hearing intact. Facial sensation intact. Face, tongue, palate moves normally and symmetrically.  Motor: Normal bulk and tone. Normal strength in all tested extremity muscles. Sensory.: intact to touch , pinprick , position and vibratory sensation.  Coordination: Rapid alternating movements normal in all extremities. Finger-to-nose and heel-to-shin performed accurately  bilaterally. Gait and Station: Arises from chair without difficulty. Stance is normal. Gait demonstrates normal stride length and balance . Able to heel, toe and tandem walk with moderate difficulty.  Reflexes: 1+ and symmetric. Toes downgoing.   NIHSS  0 Modified Rankin  2   ASSESSMENT: 65 year old Caucasian female lady with transient episode of difficulty focusing, comprehension and blurred  vision possibly post dislocation TIA versus complex partial seizure.  Remote history of TIAs and strokes.  She also complains of memory loss which is likely due to mild cognitive impairment though treatable causes need to be ruled out.  Vascular risk factors of diabetes, hypertension, hyperlipidemia and prior history of TIAs and strokes.     PLAN: I had a long discussion with the patient and her husband regarding her episode of difficulty focusing and comprehension and blurred vision being of unclear as etiology but given her prior history of strokes or TIA or complex partial seizures are a possibility which need evaluation.  I recommend we check MRI of the brain and neck, hemoglobin A1c, lipid profile, EEG.  She also has memory difficulties which are likely due to age-related mild cognitive impairment post stroke and I will check memory panel labs.  She was advised to continue aspirin for stroke prevention and maintain aggressive risk factor modification with strict control of hypertension with blood pressure goal below 130/90, diabetes with hemoglobin A1c goal below 6.5% and lipids with LDL cholesterol goal below 70 mg percent.  She was also encouraged to eat a healthy diet with lots of fruits, vegetables, cereals and whole grains and to be active and exercise regularly.  We also discussed memory compensation strategies and increase participation in cognitively challenging activities like solving crossword puzzles, playing bridge and sodoku.  She will return for follow-up in 2 months or call earlier if necessary.   Greater than 50% time during this 45-minute consultation visit was spent in counseling and coordination of care about the episode of possible TIA versus seizure and answering questions Antony Contras, MD  [facility] [address] [place], [place] [address]  Phone [phone] Fax [phone] Note: This document was prepared with digital dictation and possible smart phrase technology. Any transcriptional errors that result from this process are unintentional.

## 2020-03-31 NOTE — Telephone Encounter (Signed)
[employer] medicare order sent to [facility]. No auth they will reach out to the patient to schedule.  

## 2020-03-31 NOTE — Patient Instructions (Signed)
I had a long discussion with the patient and her husband regarding her episode of difficulty focusing and comprehension and blurred vision being of unclear as etiology but given her prior history of strokes or TIA or complex partial seizures are a possibility which need evaluation.  I recommend we check MRI of the brain and neck, hemoglobin A1c, lipid profile, EEG.  She also has memory difficulties which are likely due to age-related mild cognitive impairment post stroke and I will check memory panel labs.  She was advised to continue aspirin for stroke prevention and maintain aggressive risk factor modification with strict control of hypertension with blood pressure goal below 130/90, diabetes with hemoglobin A1c goal below 6.5% and lipids with LDL cholesterol goal below 70 mg percent.  She was also encouraged to eat a healthy diet with lots of fruits, vegetables, cereals and whole grains and to be active and exercise regularly.  We also discussed memory compensation strategies and increase participation in cognitively challenging activities like solving crossword puzzles, playing bridge and sodoku.  She will return for follow-up in 2 months or call earlier if necessary. Memory Compensation Strategies  1. Use "WARM" strategy.  W= write it down  A= associate it  R= repeat it  M= make a mental note  2.   You can keep a Social worker.  Use a 3-ring notebook with sections for the following: calendar, important names and phone numbers,  medications, doctors' names/phone numbers, lists/reminders, and a section to journal what you did  each day.   3.    Use a calendar to write appointments down.  4.    Write yourself a schedule for the day.  This can be placed on the calendar or in a separate section of the Memory Notebook.  Keeping a  regular schedule can help memory.  5.    Use medication organizer with sections for each day or morning/evening pills.  You may need help loading it  6.    Keep a  basket, or pegboard by the door.  Place items that you need to take out with you in the basket or on the pegboard.  You may also want to  include a message board for reminders.  7.    Use sticky notes.  Place sticky notes with reminders in a place where the task is performed.  For example: " turn off the  stove" placed by the stove, "lock the door" placed on the door at eye level, " take your medications" on  the bathroom mirror or by the place where you normally take your medications.  8.    Use alarms/timers.  Use while cooking to remind yourself to check on food or as a reminder to take your medicine, or as a  reminder to make a call, or as a reminder to perform another task, etc.

## 2020-04-01 LAB — LIPID PANEL
Chol/HDL Ratio: 3.9 ratio (ref 0.0–4.4)
Cholesterol, Total: 194 mg/dL (ref 100–199)
HDL: 50 mg/dL (ref >39)
LDL Chol Calc (NIH): 117 mg/dL — ABNORMAL HIGH (ref 0–99)
Triglycerides: 152 mg/dL — ABNORMAL HIGH (ref 0–149)
VLDL Cholesterol Cal: 27 mg/dL (ref 5–40)

## 2020-04-01 LAB — DEMENTIA PANEL
Homocysteine: 12.2 umol/L (ref 0.0–17.2)
RPR Ser Ql: NONREACTIVE
TSH: 3.19 u[IU]/mL (ref 0.450–4.500)
Vitamin B-12: 590 pg/mL (ref 232–1245)

## 2020-04-01 LAB — HEMOGLOBIN A1C
Est. average glucose Bld gHb Est-mCnc: 111 mg/dL
Hgb A1c MFr Bld: 5.5 % (ref 4.8–5.6)

## 2020-04-05 NOTE — Progress Notes (Signed)
Kindly inform the patient that lab work for reversible causes of memory loss was unremarkable.  Screening test for diabetes was satisfactory.  Cholesterol profile is mostly okay except bad cholesterol was elevated see primary care physician for treatment for the same

## 2020-04-07 ENCOUNTER — Telehealth: Payer: Self-pay | Admitting: *Deleted

## 2020-04-07 NOTE — Telephone Encounter (Signed)
Called pt and LVM (ok per DPR) advising of detailed lab result note per Dr Leonie Man. Advised pt to follow up with primary care for treatment of her cholesterol. Left office number in message for call back if pt has any questions. Sent labs to pcp Dr Jenny Reichmann.

## 2020-04-08 ENCOUNTER — Encounter: Payer: Self-pay | Admitting: Internal Medicine

## 2020-04-11 ENCOUNTER — Other Ambulatory Visit: Payer: Self-pay

## 2020-04-12 ENCOUNTER — Ambulatory Visit (INDEPENDENT_AMBULATORY_CARE_PROVIDER_SITE_OTHER): Payer: Medicare Other | Admitting: Internal Medicine

## 2020-04-12 ENCOUNTER — Encounter: Payer: Self-pay | Admitting: Internal Medicine

## 2020-04-12 ENCOUNTER — Other Ambulatory Visit: Payer: Self-pay | Admitting: Internal Medicine

## 2020-04-12 ENCOUNTER — Other Ambulatory Visit: Payer: Self-pay

## 2020-04-12 VITALS — BP 196/110 | HR 73 | Temp 98.5°F | Wt 180.0 lb

## 2020-04-12 DIAGNOSIS — E7800 Pure hypercholesterolemia, unspecified: Secondary | ICD-10-CM

## 2020-04-12 DIAGNOSIS — I10 Essential (primary) hypertension: Secondary | ICD-10-CM | POA: Diagnosis not present

## 2020-04-12 DIAGNOSIS — R5383 Other fatigue: Secondary | ICD-10-CM

## 2020-04-12 LAB — URINALYSIS, ROUTINE W REFLEX MICROSCOPIC
Bilirubin Urine: NEGATIVE
Hgb urine dipstick: NEGATIVE
Ketones, ur: NEGATIVE
Nitrite: POSITIVE — AB
RBC / HPF: NONE SEEN (ref 0–?)
Specific Gravity, Urine: 1.02 (ref 1.000–1.030)
Total Protein, Urine: NEGATIVE
Urine Glucose: NEGATIVE
Urobilinogen, UA: 0.2 (ref 0.0–1.0)
pH: 6 (ref 5.0–8.0)

## 2020-04-12 LAB — POCT URINALYSIS DIPSTICK
Bilirubin, UA: NEGATIVE
Blood, UA: NEGATIVE
Glucose, UA: NEGATIVE
Ketones, UA: NEGATIVE
Leukocytes, UA: NEGATIVE
Nitrite, UA: NEGATIVE
Protein, UA: NEGATIVE
Spec Grav, UA: 1.025 (ref 1.010–1.025)
Urobilinogen, UA: 0.2 E.U./dL
pH, UA: 6 (ref 5.0–8.0)

## 2020-04-12 MED ORDER — HYDRALAZINE HCL 100 MG PO TABS: 100.0000 mg | ORAL_TABLET | Freq: Three times a day (TID) | ORAL | 11 refills | Status: DC

## 2020-04-12 MED ORDER — DOXYCYCLINE HYCLATE 100 MG PO TABS
100.0000 mg | ORAL_TABLET | Freq: Two times a day (BID) | ORAL | 0 refills | Status: DC
Start: 2020-04-12 — End: 2020-05-10

## 2020-04-12 NOTE — Progress Notes (Signed)
Patient ID: Heather Dawson, female   DOB: 04/28/1955, 65 y.o.   MRN: [id number]        Chief Complaint: fatigue which has meant UTI in the past, HLD,  Right LBP, wt gain, htn       HPI:  Heather Dawson is a 65 y.o. female here with c/o abover;  Trying to follow lower chol diet.  Has been regaining wt after 50 lb wt loss, but now regained about 30 lbs with less active after walking caused SI joint pain. Has ongoing pain from knee to hip, and lower back, plans to f/u with Dr Rolena Infante, but currently working Dr Leonie Man for now.  Denies dattime hypersomnolence. Feels this tired usally with UTI .  Has HA daily for 8 days, feels like she must have a UTI but has no other symptoms but this has happened before with marked fatigue only  Just wants urine testing.  Has trieed to walk on treadmill but is too much pain to the right hip and back on the right side but denies bowel or bladder change, fever, wt loss,  worsening LE pain/numbness/weakness, gait change or falls. Wt Readings from Last 3 Encounters:  04/12/20 180 lb (81.6 kg)  03/31/20 175 lb 12.8 oz (79.7 kg)  03/23/20 178 lb (80.7 kg)   BP Readings from Last 3 Encounters:  04/12/20 (!) 196/110  03/31/20 (!) 165/71  03/23/20 (!) 158/90         Past Medical History:  Diagnosis Date  . Abdominal pain, epigastric 12/20/2006  . ALLERGIC RHINITIS 12/25/2006  . Allergy   . ANXIETY 10/16/2006   no per pt  . Arthritis    right shoulder  . BACK PAIN 06/15/2008  . Blood transfusion without reported diagnosis   . BRADYCARDIA 05/04/2008  . C. difficile diarrhea   . Carpal tunnel syndrome 10/16/2006  . Cellulitis and abscess of other specified site 06/15/2008  . CHEST PAIN 09/08/2009  . Chronic low back pain   . CIRRHOSIS 10/16/2006  . CKD (chronic kidney disease)    2008- had Ecoli and caused renal failure, no problems since then  . CKD (chronic kidney disease) stage 3, GFR 30-59 ml/min (HCC) 10/16/2006   Qualifier: Diagnosis of  By: Jenny Reichmann MD, [place] 12/20/2006  . Complication of anesthesia    awake during 2 surgeries  . CONTUSION, LOWER LEG 09/15/2008  . DEPRESSION 10/16/2006   no per pt  . Diabetes mellitus without complication ([place])   . Esophageal reflux 12/25/2006  . HYPERLIPIDEMIA 12/25/2006  . Hyperlipidemia   . HYPERTENSION 08/15/2006  . Hypertension   . HYPOTHYROIDISM 08/15/2006  . Kidney failure, acute ([place]) 2009   as a result of severe E Coli infection  . Left-sided carotid artery disease ([place])   . MIGRAINE HEADACHE 10/16/2006  . Obesity 10/16/2006  . OVARIAN CYST 12/20/2006  . Psoriasis 01/03/2011  . SHINGLES, HX OF 12/20/2006  . Stroke [facility])    3 strokes 2008-no deficits, only on ASA  . TRANSIENT ISCHEMIC ATTACKS, HX OF 12/20/2006  . VERTIGO 09/15/2007  . VITAMIN B12 DEFICIENCY 01/09/2007   Past Surgical History:  Procedure Laterality Date  . ABDOMINAL HYSTERECTOMY  02/2006  . ANTERIOR CERVICAL DECOMP/DISCECTOMY FUSION  2005  . CESAREAN SECTION    . CHOLECYSTECTOMY    . COLONOSCOPY    . ENDARTERECTOMY Left 08/25/2014   Procedure: LEFT CAROTID ENDARTERECTOMY WITH HEMASHIELD PATCH ANGIOPLASTY;  Surgeon: Elam Dutch, MD;  Location: [facility]  OR;  Service: Vascular;  Laterality: Left;  . NECK SURGERY  2005  . ROTATOR CUFF REPAIR Right 02/2014   Dr Veverly Fells    reports that she has quit smoking. She has never used smokeless tobacco. She reports that she does not drink alcohol and does not use drugs. family history includes Diabetes in her father, sister, and another family member; Heart disease in her brother, brother, father, and mother; Hypertension in her brother, brother and another family member; Lupus in her mother; Raynaud syndrome in her mother; Stroke in her father. Allergies  Allergen Reactions  . Bactrim [Sulfamethoxazole-Trimethoprim] Nausea And Vomiting  . Cefuroxime Axetil Swelling     edema/swelling  . Ciprofloxacin Swelling  . Doxycycline Nausea Only  . Levaquin [Levofloxacin In D5w] Other (See  Comments)    GI upset  . Lipitor [Atorvastatin] Other (See Comments)    Myalgia   . Lyrica [Pregabalin] Other (See Comments)    HA, confusion, swelling  . Macrodantin [Nitrofurantoin Macrocrystal] Other (See Comments)    GI upset, diarrhea and sob  . Gabapentin Rash   Current Outpatient Medications on File Prior to Visit  Medication Sig Dispense Refill  . acetaminophen (TYLENOL) 500 MG tablet Take 1,000 mg by mouth every 8 (eight) hours as needed (pain).    . Apoaequorin (PREVAGEN) 10 MG CAPS Take by mouth.    Marland Kitchen aspirin 325 MG EC tablet Take 325 mg by mouth daily.    . Cholecalciferol (VITAMIN D3) 1000 units CAPS Take 2 capsules (2,000 Units total) by mouth daily. 60 capsule 0  . clobetasol (TEMOVATE) 0.05 % external solution Apply 1 application topically daily as needed (scalp psoriasis).   0  . diclofenac sodium (VOLTAREN) 1 % GEL Apply 4 g topically 4 (four) times daily as needed. 400 g 11  . ID NOW COVID-19 KIT See admin instructions.    Marland Kitchen levocetirizine (XYZAL) 5 MG tablet     . levothyroxine (SYNTHROID) 75 MCG tablet Take 1 tablet (75 mcg total) by mouth daily. 90 tablet 3  . lisinopril (ZESTRIL) 20 MG tablet Take 1 tablet (20 mg total) by mouth daily. 90 tablet 3  . lovastatin (MEVACOR) 40 MG tablet Take 2 tablets (80 mg total) by mouth at bedtime. 180 tablet 3  . mupirocin cream (BACTROBAN) 2 % Apply 1 application topically 2 (two) times daily.    . pantoprazole (PROTONIX) 40 MG tablet Take 1 tablet (40 mg total) by mouth daily. 90 tablet 3  . SUMAtriptan (IMITREX) 100 MG tablet TAKE 1 TABLET BY MOUTH EVERY 2 HOURS AS NEEDED FOR MIGRAINE OR HEADACHE, MAY REPEAT IN 2 HOURS IF HEADACHE PERSISTS OR RECURS 10 tablet 2  . triamcinolone cream (KENALOG) 0.1 % SMARTSIG:1 Application Topical 2-3 Times Daily    . calcium carbonate (OSCAL) 1500 (600 Ca) MG TABS tablet calcium carbonate 600 mg calcium (1,500 mg) tablet    . clonazePAM (KLONOPIN) 0.5 MG tablet Take 1 tablet (0.5 mg total) by  mouth 2 (two) times daily as needed for anxiety. 60 tablet 0  . diazepam (VALIUM) 5 MG tablet 1 tab by mouth approx 1 hour prior to the MRI 1 tablet 0  . FLUoxetine (PROZAC) 10 MG capsule Take 1 capsule (10 mg total) by mouth daily. 90 capsule 3  . vitamin B-12 (CYANOCOBALAMIN) 1000 MCG tablet Take 1 tablet (1,000 mcg total) by mouth daily. 90 tablet 1   No current facility-administered medications on file prior to visit.        ROS:  All others reviewed and negative.  Objective        PE:  BP (!) 196/110 (BP Location: Left Arm, Patient Position: Sitting, Cuff Size: Normal)   Pulse 73   Temp 98.5 F (36.9 C) (Oral)   Wt 180 lb (81.6 kg)   SpO2 98%   BMI 32.92 kg/m                 Constitutional: Pt appears in NAD               HENT: Head: NCAT.                Right Ear: External ear normal.                 Left Ear: External ear normal.                Eyes: . Pupils are equal, round, and reactive to light. Conjunctivae and EOM are normal               Nose: without d/c or deformity               Neck: Neck supple. Gross normal ROM               Cardiovascular: Normal rate and regular rhythm.                 Pulmonary/Chest: Effort normal and breath sounds without rales or wheezing.                Abd:  Soft, NT, ND, + BS, no organomegaly               Neurological: Pt is alert. At baseline orientation, motor grossly intact               Skin: Skin is warm. No rashes, no other new lesions, LE edema - none               Psychiatric: Pt behavior is normal without agitation   Micro: none  Cardiac tracings I have personally interpreted today:  none  Pertinent Radiological findings (summarize): none   Lab Results  Component Value Date   WBC 5.7 09/29/2019   HGB 12.0 09/29/2019   HCT 36.8 09/29/2019   PLT 200 09/29/2019   GLUCOSE 96 07/03/2019   CHOL 194 03/31/2020   TRIG 152 (H) 03/31/2020   HDL 50 03/31/2020   LDLDIRECT 140.0 08/05/2018   LDLCALC 117 (H) 03/31/2020    ALT 15 09/29/2019   AST 17 09/29/2019   NA 138 07/03/2019   K 4.9 07/03/2019   CL 103 07/03/2019   CREATININE 1.66 (H) 07/03/2019   BUN 41 (H) 07/03/2019   CO2 27 07/03/2019   TSH 3.190 03/31/2020   INR 0.99 08/17/2014   HGBA1C 5.5 03/31/2020   MICROALBUR 3.3 (H) 05/01/2019   Assessment/Plan:  Heather Dawson is a 65 y.o. White or Caucasian [1] female with  has a past medical history of Abdominal pain, epigastric (12/20/2006), ALLERGIC RHINITIS (12/25/2006), Allergy, ANXIETY (10/16/2006), Arthritis, BACK PAIN (06/15/2008), Blood transfusion without reported diagnosis, BRADYCARDIA (05/04/2008), C. difficile diarrhea, Carpal tunnel syndrome (10/16/2006), Cellulitis and abscess of other specified site (06/15/2008), CHEST PAIN (09/08/2009), Chronic low back pain, CIRRHOSIS (10/16/2006), CKD (chronic kidney disease), CKD (chronic kidney disease) stage 3, GFR 30-59 ml/min (HCC) (10/16/2006), COMMON MIGRAINE (67/01/4579), Complication of anesthesia, CONTUSION, LOWER LEG (09/15/2008), DEPRESSION (10/16/2006), Diabetes mellitus without complication ([place]), Esophageal reflux (12/25/2006), HYPERLIPIDEMIA (12/25/2006), Hyperlipidemia, HYPERTENSION (  08/15/2006), Hypertension, HYPOTHYROIDISM (08/15/2006), Kidney failure, acute ([place]) (2009), Left-sided carotid artery disease ([place]), MIGRAINE HEADACHE (10/16/2006), Obesity (10/16/2006), OVARIAN CYST (12/20/2006), Psoriasis (01/03/2011), SHINGLES, HX OF (12/20/2006), Stroke ([place]), TRANSIENT ISCHEMIC ATTACKS, HX OF (12/20/2006), VERTIGO (09/15/2007), and VITAMIN B12 DEFICIENCY (01/09/2007).  Fatigue Exam benign, cant r/o uti - for urine studies  Essential hypertension Unocntrolled, for increased hydralazine 100 tid  Hyperlipidemia Uncontrolled; declines change of lovastatin, cont low chol diet  Followup: Return in about 4 weeks (around 05/10/2020).  [name], MD 04/20/2020 11:07 PM [facility]

## 2020-04-12 NOTE — Patient Instructions (Signed)
Ok to increase the hydralazine 100 mg three times per day  We will send the urine specimen for testing today  Please continue all other medications as before, and refills have been done if requested.  Please have the pharmacy call with any other refills you may need.  Please continue your efforts at being more active, low cholesterol diet, and weight control.  Please keep your appointments with your specialists as you may have planned  Please make an Appointment to return in 1 month

## 2020-04-13 ENCOUNTER — Ambulatory Visit
Admission: RE | Admit: 2020-04-13 | Discharge: 2020-04-13 | Disposition: A | Discharge status: Home / Self Care | Payer: Medicare Other | Source: Ambulatory Visit | Attending: Neurology | Admitting: Neurology

## 2020-04-13 DIAGNOSIS — R413 Other amnesia: Secondary | ICD-10-CM | POA: Diagnosis not present

## 2020-04-13 DIAGNOSIS — G459 Transient cerebral ischemic attack, unspecified: Secondary | ICD-10-CM | POA: Diagnosis not present

## 2020-04-13 MED ORDER — GADOBENATE DIMEGLUMINE 529 MG/ML IV SOLN
15.0000 mL | Freq: Once | INTRAVENOUS | Status: AC | PRN
  Administered 2020-04-13: 15 mL via INTRAVENOUS

## 2020-04-14 ENCOUNTER — Encounter: Payer: Self-pay | Admitting: Internal Medicine

## 2020-04-14 LAB — URINE CULTURE

## 2020-04-14 MED ORDER — FOSFOMYCIN TROMETHAMINE 3 G PO PACK: 3.0000 g | PACK | Freq: Once | ORAL | 0 refills | Status: AC

## 2020-04-20 ENCOUNTER — Encounter: Payer: Self-pay | Admitting: Internal Medicine

## 2020-04-20 ENCOUNTER — Ambulatory Visit: Payer: Medicare Other | Admitting: Internal Medicine

## 2020-04-20 DIAGNOSIS — H5213 Myopia, bilateral: Secondary | ICD-10-CM | POA: Diagnosis not present

## 2020-04-20 LAB — HM DIABETES EYE EXAM

## 2020-04-20 MED ORDER — AMLODIPINE BESYLATE 5 MG PO TABS: 5.0000 mg | ORAL_TABLET | Freq: Every day | ORAL | 3 refills | Status: DC

## 2020-04-20 NOTE — Assessment & Plan Note (Signed)
Exam benign, cant r/o uti - for urine studies

## 2020-04-20 NOTE — Assessment & Plan Note (Signed)
Unocntrolled, for increased hydralazine 100 tid

## 2020-04-20 NOTE — Assessment & Plan Note (Signed)
Uncontrolled; declines change of lovastatin, cont low chol diet

## 2020-04-22 NOTE — Progress Notes (Signed)
Kindly inform the patient that MR angiogram study of the brain shows blockage of a major blood vessel on the front on the right side call middle cerebral artery but this appears to be longstanding.   MR angiogram study of the neck does not show any major blockages in the neck.

## 2020-04-25 ENCOUNTER — Encounter: Payer: Self-pay | Admitting: Internal Medicine

## 2020-04-25 ENCOUNTER — Ambulatory Visit: Payer: Medicare Other | Admitting: Neurology

## 2020-04-25 ENCOUNTER — Encounter: Payer: Self-pay | Admitting: *Deleted

## 2020-04-25 DIAGNOSIS — G459 Transient cerebral ischemic attack, unspecified: Secondary | ICD-10-CM

## 2020-04-25 DIAGNOSIS — R413 Other amnesia: Secondary | ICD-10-CM

## 2020-04-25 DIAGNOSIS — R410 Disorientation, unspecified: Secondary | ICD-10-CM

## 2020-04-26 ENCOUNTER — Encounter: Payer: Self-pay | Admitting: *Deleted

## 2020-04-26 NOTE — Progress Notes (Signed)
Kindly inform the patient that EEG study was normal

## 2020-04-27 ENCOUNTER — Telehealth: Payer: Self-pay | Admitting: Neurology

## 2020-04-27 NOTE — Telephone Encounter (Signed)
Pt asking for a call to discuss test results showing on mychart

## 2020-04-27 NOTE — Telephone Encounter (Signed)
Called patient who had questions about MRA results. I answered them to her stated satisfaction, advised she can discuss further with Dr Leonie Man at [facility] in May. Patient verbalized understanding, appreciation.

## 2020-04-28 NOTE — Telephone Encounter (Signed)
I spoke to the patient and gave her the results of the MRA of the brain showing what appears to be chronic right middle cerebral artery occlusion.  I looked through her medical records and noted that she had both an MRA and diagnostic cerebral catheter angiogram in September 2009 showing a high-grade stenosis of the right MCA origin and I suspect over time that has gotten worse and progressed to chronic occlusion but fortunately but the nature has been kind to her and she has developed collaterals and not had a big right MCA stroke.  I recommend she is to stay on antiplatelet therapy and maintain aggressive risk factor modification.  She voiced understanding.

## 2020-04-28 NOTE — Telephone Encounter (Signed)
Pt called stating that she is wanting to discuss her results of her MRA with provider so that the blockage that showed up can be explained to her.

## 2020-04-29 ENCOUNTER — Other Ambulatory Visit: Payer: Self-pay | Admitting: Internal Medicine

## 2020-05-10 ENCOUNTER — Ambulatory Visit (INDEPENDENT_AMBULATORY_CARE_PROVIDER_SITE_OTHER): Payer: Medicare Other | Admitting: Internal Medicine

## 2020-05-10 ENCOUNTER — Other Ambulatory Visit: Payer: Self-pay

## 2020-05-10 ENCOUNTER — Encounter: Payer: Self-pay | Admitting: Internal Medicine

## 2020-05-10 VITALS — BP 152/84 | HR 65 | Ht 62.0 in | Wt 182.0 lb

## 2020-05-10 DIAGNOSIS — I10 Essential (primary) hypertension: Secondary | ICD-10-CM | POA: Diagnosis not present

## 2020-05-10 DIAGNOSIS — E559 Vitamin D deficiency, unspecified: Secondary | ICD-10-CM | POA: Diagnosis not present

## 2020-05-10 DIAGNOSIS — M5441 Lumbago with sciatica, right side: Secondary | ICD-10-CM

## 2020-05-10 DIAGNOSIS — E785 Hyperlipidemia, unspecified: Secondary | ICD-10-CM

## 2020-05-10 DIAGNOSIS — Z0000 Encounter for general adult medical examination without abnormal findings: Secondary | ICD-10-CM | POA: Diagnosis not present

## 2020-05-10 DIAGNOSIS — Z0001 Encounter for general adult medical examination with abnormal findings: Secondary | ICD-10-CM

## 2020-05-10 DIAGNOSIS — E1165 Type 2 diabetes mellitus with hyperglycemia: Secondary | ICD-10-CM

## 2020-05-10 DIAGNOSIS — E538 Deficiency of other specified B group vitamins: Secondary | ICD-10-CM

## 2020-05-10 DIAGNOSIS — N1832 Chronic kidney disease, stage 3b: Secondary | ICD-10-CM

## 2020-05-10 DIAGNOSIS — G8929 Other chronic pain: Secondary | ICD-10-CM

## 2020-05-10 LAB — CBC WITH DIFFERENTIAL/PLATELET
Basophils Absolute: 0 10*3/uL (ref 0.0–0.1)
Basophils Relative: 0.7 % (ref 0.0–3.0)
Eosinophils Absolute: 0.3 10*3/uL (ref 0.0–0.7)
Eosinophils Relative: 5 % (ref 0.0–5.0)
HCT: 37.5 % (ref 36.0–46.0)
Hemoglobin: 12.5 g/dL (ref 12.0–15.0)
Lymphocytes Relative: 22.3 % (ref 12.0–46.0)
Lymphs Abs: 1.3 10*3/uL (ref 0.7–4.0)
MCHC: 33.5 g/dL (ref 30.0–36.0)
MCV: 97.9 fl (ref 78.0–100.0)
Monocytes Absolute: 0.5 10*3/uL (ref 0.1–1.0)
Monocytes Relative: 8.4 % (ref 3.0–12.0)
Neutro Abs: 3.7 10*3/uL (ref 1.4–7.7)
Neutrophils Relative %: 63.6 % (ref 43.0–77.0)
Platelets: 219 10*3/uL (ref 150.0–400.0)
RBC: 3.83 Mil/uL — ABNORMAL LOW (ref 3.87–5.11)
RDW: 12.7 % (ref 11.5–15.5)
WBC: 5.8 10*3/uL (ref 4.0–10.5)

## 2020-05-10 LAB — VITAMIN D 25 HYDROXY (VIT D DEFICIENCY, FRACTURES): VITD: 62.39 ng/mL (ref 30.00–100.00)

## 2020-05-10 LAB — URINALYSIS, ROUTINE W REFLEX MICROSCOPIC
Bilirubin Urine: NEGATIVE
Hgb urine dipstick: NEGATIVE
Ketones, ur: NEGATIVE
Leukocytes,Ua: NEGATIVE
Nitrite: NEGATIVE
RBC / HPF: NONE SEEN (ref 0–?)
Specific Gravity, Urine: 1.02 (ref 1.000–1.030)
Total Protein, Urine: NEGATIVE
Urine Glucose: NEGATIVE
Urobilinogen, UA: 0.2 (ref 0.0–1.0)
pH: 6 (ref 5.0–8.0)

## 2020-05-10 LAB — HEPATIC FUNCTION PANEL
ALT: 14 U/L (ref 0–35)
AST: 16 U/L (ref 0–37)
Albumin: 4.5 g/dL (ref 3.5–5.2)
Alkaline Phosphatase: 53 U/L (ref 39–117)
Bilirubin, Direct: 0.1 mg/dL (ref 0.0–0.3)
Total Bilirubin: 0.7 mg/dL (ref 0.2–1.2)
Total Protein: 6.7 g/dL (ref 6.0–8.3)

## 2020-05-10 LAB — LIPID PANEL
Cholesterol: 179 mg/dL (ref 0–200)
HDL: 51.2 mg/dL (ref >39.00)
LDL Cholesterol: 107 mg/dL — ABNORMAL HIGH (ref 0–99)
NonHDL: 127.91
Total CHOL/HDL Ratio: 3
Triglycerides: 103 mg/dL (ref 0.0–149.0)
VLDL: 20.6 mg/dL (ref 0.0–40.0)

## 2020-05-10 LAB — BASIC METABOLIC PANEL
BUN: 35 mg/dL — ABNORMAL HIGH (ref 6–23)
CO2: 25 mEq/L (ref 19–32)
Calcium: 9.6 mg/dL (ref 8.4–10.5)
Chloride: 107 mEq/L (ref 96–112)
Creatinine, Ser: 2.01 mg/dL — ABNORMAL HIGH (ref 0.40–1.20)
GFR: 25.95 mL/min — ABNORMAL LOW (ref >60.00)
Glucose, Bld: 102 mg/dL — ABNORMAL HIGH (ref 70–99)
Potassium: 5.1 mEq/L (ref 3.5–5.1)
Sodium: 141 mEq/L (ref 135–145)

## 2020-05-10 LAB — VITAMIN B12: Vitamin B-12: 443 pg/mL (ref 211–911)

## 2020-05-10 LAB — TSH: TSH: 2.79 u[IU]/mL (ref 0.35–4.50)

## 2020-05-10 MED ORDER — AMLODIPINE BESYLATE 10 MG PO TABS
10.0000 mg | ORAL_TABLET | Freq: Every day | ORAL | 3 refills | Status: DC
Start: 2020-05-10 — End: 2021-05-08

## 2020-05-10 MED ORDER — FOSFOMYCIN TROMETHAMINE 3 G PO PACK: 3.0000 g | PACK | Freq: Once | ORAL | 0 refills | Status: AC

## 2020-05-10 MED ORDER — PHENTERMINE HCL 37.5 MG PO CAPS: 37.5000 mg | ORAL_CAPSULE | ORAL | 0 refills | Status: DC

## 2020-05-10 NOTE — Progress Notes (Deleted)
Patient ID: Heather Dawson, female   DOB: Aug 24, 1955, 65 y.o.   MRN: [id number]        Chief Complaint: follow up HTN, HLD and hyperglycemia ***       HPI:  Heather Dawson is a 65 y.o. female here with c/o        Wt Readings from Last 3 Encounters:  05/10/20 182 lb (82.6 kg)  04/12/20 180 lb (81.6 kg)  03/31/20 175 lb 12.8 oz (79.7 kg)   BP Readings from Last 3 Encounters:  05/10/20 (!) 152/84  04/12/20 (!) 196/110  03/31/20 (!) 165/71         Past Medical History:  Diagnosis Date  . Abdominal pain, epigastric 12/20/2006  . ALLERGIC RHINITIS 12/25/2006  . Allergy   . ANXIETY 10/16/2006   no per pt  . Arthritis    right shoulder  . BACK PAIN 06/15/2008  . Blood transfusion without reported diagnosis   . BRADYCARDIA 05/04/2008  . C. difficile diarrhea   . Carpal tunnel syndrome 10/16/2006  . Cellulitis and abscess of other specified site 06/15/2008  . CHEST PAIN 09/08/2009  . Chronic low back pain   . CIRRHOSIS 10/16/2006  . CKD (chronic kidney disease)    2008- had Ecoli and caused renal failure, no problems since then  . CKD (chronic kidney disease) stage 3, GFR 30-59 ml/min (HCC) 10/16/2006   Qualifier: Diagnosis of  By: Jenny Reichmann MD, [place] 12/20/2006  . Complication of anesthesia    awake during 2 surgeries  . CONTUSION, LOWER LEG 09/15/2008  . DEPRESSION 10/16/2006   no per pt  . Diabetes mellitus without complication ([place])   . Esophageal reflux 12/25/2006  . HYPERLIPIDEMIA 12/25/2006  . Hyperlipidemia   . HYPERTENSION 08/15/2006  . Hypertension   . HYPOTHYROIDISM 08/15/2006  . Kidney failure, acute ([place]) 2009   as a result of severe E Coli infection  . Left-sided carotid artery disease ([place])   . MIGRAINE HEADACHE 10/16/2006  . Obesity 10/16/2006  . OVARIAN CYST 12/20/2006  . Psoriasis 01/03/2011  . SHINGLES, HX OF 12/20/2006  . Stroke [facility])    3 strokes 2008-no deficits, only on ASA  . TRANSIENT ISCHEMIC ATTACKS, HX OF 12/20/2006  . VERTIGO 09/15/2007  .  VITAMIN B12 DEFICIENCY 01/09/2007   Past Surgical History:  Procedure Laterality Date  . ABDOMINAL HYSTERECTOMY  02/2006  . ANTERIOR CERVICAL DECOMP/DISCECTOMY FUSION  2005  . CESAREAN SECTION    . CHOLECYSTECTOMY    . COLONOSCOPY    . ENDARTERECTOMY Left 08/25/2014   Procedure: LEFT CAROTID ENDARTERECTOMY WITH HEMASHIELD PATCH ANGIOPLASTY;  Surgeon: Elam Dutch, MD;  Location: [facility];  Service: Vascular;  Laterality: Left;  . NECK SURGERY  2005  . ROTATOR CUFF REPAIR Right 02/2014   Dr Veverly Fells    reports that she has quit smoking. She has never used smokeless tobacco. She reports that she does not drink alcohol and does not use drugs. family history includes Diabetes in her father, sister, and another family member; Heart disease in her brother, brother, father, and mother; Hypertension in her brother, brother and another family member; Lupus in her mother; Raynaud syndrome in her mother; Stroke in her father. Allergies  Allergen Reactions  . Bactrim [Sulfamethoxazole-Trimethoprim] Nausea And Vomiting  . Cefuroxime Axetil Swelling     edema/swelling  . Ciprofloxacin Swelling  . Doxycycline Nausea Only  . Levaquin [Levofloxacin In D5w] Other (See Comments)    GI upset  .  Lipitor [Atorvastatin] Other (See Comments)    Myalgia   . Lyrica [Pregabalin] Other (See Comments)    HA, confusion, swelling  . Macrodantin [Nitrofurantoin Macrocrystal] Other (See Comments)    GI upset, diarrhea and sob  . Gabapentin Rash   Current Outpatient Medications on File Prior to Visit  Medication Sig Dispense Refill  . acetaminophen (TYLENOL) 500 MG tablet Take 1,000 mg by mouth every 8 (eight) hours as needed (pain).    Marland Kitchen amLODipine (NORVASC) 5 MG tablet Take 1 tablet (5 mg total) by mouth daily. 90 tablet 3  . Apoaequorin (PREVAGEN) 10 MG CAPS Take by mouth.    Marland Kitchen aspirin 325 MG EC tablet Take 325 mg by mouth daily.    . Cholecalciferol (VITAMIN D3) 1000 units CAPS Take 2 capsules (2,000 Units  total) by mouth daily. 60 capsule 0  . clobetasol (TEMOVATE) 0.05 % external solution Apply 1 application topically daily as needed (scalp psoriasis).   0  . diclofenac sodium (VOLTAREN) 1 % GEL Apply 4 g topically 4 (four) times daily as needed. 400 g 11  . doxycycline (VIBRA-TABS) 100 MG tablet Take 1 tablet (100 mg total) by mouth 2 (two) times daily. 20 tablet 0  . hydrALAZINE (APRESOLINE) 100 MG tablet Take 1 tablet (100 mg total) by mouth 3 (three) times daily. 90 tablet 11  . ID NOW COVID-19 KIT See admin instructions.    Marland Kitchen levocetirizine (XYZAL) 5 MG tablet     . levothyroxine (SYNTHROID) 75 MCG tablet TAKE ONE TABLET BY MOUTH DAILY 90 tablet 3  . lisinopril (ZESTRIL) 20 MG tablet Take 1 tablet (20 mg total) by mouth daily. 90 tablet 3  . lovastatin (MEVACOR) 40 MG tablet Take 2 tablets (80 mg total) by mouth at bedtime. 180 tablet 3  . mupirocin cream (BACTROBAN) 2 % Apply 1 application topically 2 (two) times daily.    . pantoprazole (PROTONIX) 40 MG tablet Take 1 tablet (40 mg total) by mouth daily. 90 tablet 3  . SUMAtriptan (IMITREX) 100 MG tablet TAKE 1 TABLET BY MOUTH EVERY 2 HOURS AS NEEDED FOR MIGRAINE OR HEADACHE, MAY REPEAT IN 2 HOURS IF HEADACHE PERSISTS OR RECURS 10 tablet 2  . triamcinolone cream (KENALOG) 0.1 % SMARTSIG:1 Application Topical 2-3 Times Daily     No current facility-administered medications on file prior to visit.        ROS:  All others reviewed and negative.  Objective        PE:  BP (!) 152/84   Pulse 65   Ht 5' 2" (1.575 m)   Wt 182 lb (82.6 kg)   SpO2 99%   BMI 33.29 kg/m                 Constitutional: Pt appears in NAD               HENT: Head: NCAT.                Right Ear: External ear normal.                 Left Ear: External ear normal.                Eyes: . Pupils are equal, round, and reactive to light. Conjunctivae and EOM are normal               Nose: without d/c or deformity  Neck: Neck supple. Gross normal  ROM               Cardiovascular: Normal rate and regular rhythm.                 Pulmonary/Chest: Effort normal and breath sounds without rales or wheezing.                Abd:  Soft, NT, ND, + BS, no organomegaly               Neurological: Pt is alert. At baseline orientation, motor grossly intact               Skin: Skin is warm. No rashes, no other new lesions, LE edema - ***               Psychiatric: Pt behavior is normal without agitation   Micro: none  Cardiac tracings I have personally interpreted today:  none  Pertinent Radiological findings (summarize): none   Lab Results  Component Value Date   WBC 5.7 09/29/2019   HGB 12.0 09/29/2019   HCT 36.8 09/29/2019   PLT 200 09/29/2019   GLUCOSE 96 07/03/2019   CHOL 194 03/31/2020   TRIG 152 (H) 03/31/2020   HDL 50 03/31/2020   LDLDIRECT 140.0 08/05/2018   LDLCALC 117 (H) 03/31/2020   ALT 15 09/29/2019   AST 17 09/29/2019   NA 138 07/03/2019   K 4.9 07/03/2019   CL 103 07/03/2019   CREATININE 1.66 (H) 07/03/2019   BUN 41 (H) 07/03/2019   CO2 27 07/03/2019   TSH 3.190 03/31/2020   INR 0.99 08/17/2014   HGBA1C 5.5 03/31/2020   MICROALBUR 3.3 (H) 05/01/2019   Assessment/Plan:  Heather Dawson is a 65 y.o. White or Caucasian [1] female with  has a past medical history of Abdominal pain, epigastric (12/20/2006), ALLERGIC RHINITIS (12/25/2006), Allergy, ANXIETY (10/16/2006), Arthritis, BACK PAIN (06/15/2008), Blood transfusion without reported diagnosis, BRADYCARDIA (05/04/2008), C. difficile diarrhea, Carpal tunnel syndrome (10/16/2006), Cellulitis and abscess of other specified site (06/15/2008), CHEST PAIN (09/08/2009), Chronic low back pain, CIRRHOSIS (10/16/2006), CKD (chronic kidney disease), CKD (chronic kidney disease) stage 3, GFR 30-59 ml/min (HCC) (10/16/2006), COMMON MIGRAINE (35/52/1747), Complication of anesthesia, CONTUSION, LOWER LEG (09/15/2008), DEPRESSION (10/16/2006), Diabetes mellitus without complication ([place]),  Esophageal reflux (12/25/2006), HYPERLIPIDEMIA (12/25/2006), Hyperlipidemia, HYPERTENSION (08/15/2006), Hypertension, HYPOTHYROIDISM (08/15/2006), Kidney failure, acute ([place]) (2009), Left-sided carotid artery disease ([place]), MIGRAINE HEADACHE (10/16/2006), Obesity (10/16/2006), OVARIAN CYST (12/20/2006), Psoriasis (01/03/2011), SHINGLES, HX OF (12/20/2006), Stroke ([place]), TRANSIENT ISCHEMIC ATTACKS, HX OF (12/20/2006), VERTIGO (09/15/2007), and VITAMIN B12 DEFICIENCY (01/09/2007).  No problem-specific Assessment & Plan notes found for this encounter.  Followup: No follow-ups on file.  [name], MD 05/10/2020 8:40 AM [facility]

## 2020-05-10 NOTE — Patient Instructions (Signed)
Ok to change the amlodipine to 10 mg per day  Please take all new medication as prescribed - the phentermine for wt loss  Please consider f/u with GYN if you continue to have pain  Please continue all other medications as before, and refills have been done if requested.  Please have the pharmacy call with any other refills you may need.  Please continue your efforts at being more active, low cholesterol diet, and weight control.  You are otherwise up to date with prevention measures today.  Please keep your appointments with your specialists as you may have planned  Please go to the LAB at the blood drawing area for the tests to be done  You will be contacted by phone if any changes need to be made immediately.  Otherwise, you will receive a letter about your results with an explanation, but please check with MyChart first.  Please remember to sign up for MyChart if you have not done so, as this will be important to you in the future with finding out test results, communicating by private email, and scheduling acute appointments online when needed.  Please make an Appointment to return in 3 month

## 2020-05-18 ENCOUNTER — Encounter: Payer: Self-pay | Admitting: Internal Medicine

## 2020-05-18 NOTE — Assessment & Plan Note (Signed)
Age and sex appropriate education and counseling updated with regular exercise and diet Referrals for preventative services - none needed Immunizations addressed - declines tdap Smoking counseling  - none needed Evidence for depression or other mood disorder - none significant Most recent labs reviewed. I have personally reviewed and have noted: 1) the patient's medical and social history 2) The patient's current medications and supplements 3) The patient's height, weight, and BMI have been recorded in the chart  

## 2020-05-18 NOTE — Assessment & Plan Note (Signed)
Improved since found low 1 yr ago,  Lab Results  Component Value Date   VITAMINB12 443 05/10/2020   Cont same tx

## 2020-05-18 NOTE — Progress Notes (Signed)
Patient ID: Heather Dawson, female   DOB: 1955/11/08, 65 y.o.   MRN: [id number]         Chief Complaint:: wellness exam and Follow-up  htn, uti, dyspareunia, obesity, ckd       HPI:  Heather Dawson is a 65 y.o. female here for wellness exam , declines Tdap, o/w up to date with preventive referrals and immunizations.                          Also c/o pain on intercourse x 1 yesterday, severe, tearing like, no pain now but was quite awful to her, wondering if this has to do with UTI or kidney functioning, also plans to f/u with GYN.  Also quite frustrated over inability to lose wt due to ongoing orthopedic issue primarily right lower back hip and leg pain so that she is forced to remain inactive, needs to have SI joint procedure but declined by insurance as her BMI is too high.  Tearful today.  BP seems to be worsening with wt increase as well.  Denies urinary symptoms such as dysuria, frequency, urgency, flank pain, hematuria or n/v, fever, chills, but had another uti recenlty, asking for f/u UA  Has regular f/u now with renal as well, overall kidney function said to be stable per pt.  Pt denies chest pain, increased sob or doe, wheezing, orthopnea, PND, increased LE swelling, palpitations, dizziness or syncope.   Pt denies polydipsia, polyuria, Denies other new worsening focal neuro s/s.  No other new complaints   Wt Readings from Last 3 Encounters:  05/10/20 182 lb (82.6 kg)  04/12/20 180 lb (81.6 kg)  03/31/20 175 lb 12.8 oz (79.7 kg)   BP Readings from Last 3 Encounters:  05/10/20 (!) 152/84  04/12/20 (!) 196/110  03/31/20 (!) 165/71   Immunization History  Administered Date(s) Administered  . Influenza Split 01/03/2011  . Influenza Whole 12/28/2005, 01/07/2007, 11/18/2007, 11/18/2008  . Influenza,inj,Quad PF,6+ Mos 12/30/2012, 11/06/2013, 11/10/2014, 12/20/2015, 11/13/2016, 10/24/2017, 10/31/2018, 03/23/2020  . PFIZER Comirnaty(Gray Top)Covid-19 Tri-Sucrose Vaccine 07/03/2019, 07/24/2019,  02/04/2020  . Pneumococcal Conjugate-13 05/03/2016  . Pneumococcal Polysaccharide-23 04/23/2017  . Td 08/30/2009  . Zoster Recombinat (Shingrix) 04/23/2017  There are no preventive care reminders to display for this patient.    Past Medical History:  Diagnosis Date  . Abdominal pain, epigastric 12/20/2006  . ALLERGIC RHINITIS 12/25/2006  . Allergy   . ANXIETY 10/16/2006   no per pt  . Arthritis    right shoulder  . BACK PAIN 06/15/2008  . Blood transfusion without reported diagnosis   . BRADYCARDIA 05/04/2008  . C. difficile diarrhea   . Carpal tunnel syndrome 10/16/2006  . Cellulitis and abscess of other specified site 06/15/2008  . CHEST PAIN 09/08/2009  . Chronic low back pain   . CIRRHOSIS 10/16/2006  . CKD (chronic kidney disease)    2008- had Ecoli and caused renal failure, no problems since then  . CKD (chronic kidney disease) stage 3, GFR 30-59 ml/min (HCC) 10/16/2006   Qualifier: Diagnosis of  By: Jenny Reichmann MD, [place] 12/20/2006  . Complication of anesthesia    awake during 2 surgeries  . CONTUSION, LOWER LEG 09/15/2008  . DEPRESSION 10/16/2006   no per pt  . Diabetes mellitus without complication ([place])   . Esophageal reflux 12/25/2006  . HYPERLIPIDEMIA 12/25/2006  . Hyperlipidemia   . HYPERTENSION 08/15/2006  . Hypertension   . HYPOTHYROIDISM  08/15/2006  . Kidney failure, acute ([place]) 2009   as a result of severe E Coli infection  . Left-sided carotid artery disease ([place])   . MIGRAINE HEADACHE 10/16/2006  . Obesity 10/16/2006  . OVARIAN CYST 12/20/2006  . Psoriasis 01/03/2011  . SHINGLES, HX OF 12/20/2006  . Stroke [facility])    3 strokes 2008-no deficits, only on ASA  . TRANSIENT ISCHEMIC ATTACKS, HX OF 12/20/2006  . VERTIGO 09/15/2007  . VITAMIN B12 DEFICIENCY 01/09/2007   Past Surgical History:  Procedure Laterality Date  . ABDOMINAL HYSTERECTOMY  02/2006  . ANTERIOR CERVICAL DECOMP/DISCECTOMY FUSION  2005  . CESAREAN SECTION    . CHOLECYSTECTOMY     . COLONOSCOPY    . ENDARTERECTOMY Left 08/25/2014   Procedure: LEFT CAROTID ENDARTERECTOMY WITH HEMASHIELD PATCH ANGIOPLASTY;  Surgeon: Elam Dutch, MD;  Location: [place];  Service: Vascular;  Laterality: Left;  . NECK SURGERY  2005  . ROTATOR CUFF REPAIR Right 02/2014   Dr Veverly Fells    reports that she has quit smoking. She has never used smokeless tobacco. She reports that she does not drink alcohol and does not use drugs. family history includes Diabetes in her father, sister, and another family member; Heart disease in her brother, brother, father, and mother; Hypertension in her brother, brother and another family member; Lupus in her mother; Raynaud syndrome in her mother; Stroke in her father. Allergies  Allergen Reactions  . Bactrim [Sulfamethoxazole-Trimethoprim] Nausea And Vomiting  . Cefuroxime Axetil Swelling     edema/swelling  . Ciprofloxacin Swelling  . Doxycycline Nausea Only  . Levaquin [Levofloxacin In D5w] Other (See Comments)    GI upset  . Lipitor [Atorvastatin] Other (See Comments)    Myalgia   . Lyrica [Pregabalin] Other (See Comments)    HA, confusion, swelling  . Macrodantin [Nitrofurantoin Macrocrystal] Other (See Comments)    GI upset, diarrhea and sob  . Gabapentin Rash   Current Outpatient Medications on File Prior to Visit  Medication Sig Dispense Refill  . acetaminophen (TYLENOL) 500 MG tablet Take 1,000 mg by mouth every 8 (eight) hours as needed (pain).    . Apoaequorin (PREVAGEN) 10 MG CAPS Take by mouth.    Marland Kitchen aspirin 325 MG EC tablet Take 325 mg by mouth daily.    . Cholecalciferol (VITAMIN D3) 1000 units CAPS Take 2 capsules (2,000 Units total) by mouth daily. 60 capsule 0  . clobetasol (TEMOVATE) 0.05 % external solution Apply 1 application topically daily as needed (scalp psoriasis).   0  . diclofenac sodium (VOLTAREN) 1 % GEL Apply 4 g topically 4 (four) times daily as needed. 400 g 11  . hydrALAZINE (APRESOLINE) 100 MG tablet Take 1 tablet  (100 mg total) by mouth 3 (three) times daily. 90 tablet 11  . ID NOW COVID-19 KIT See admin instructions.    Marland Kitchen levocetirizine (XYZAL) 5 MG tablet     . levothyroxine (SYNTHROID) 75 MCG tablet TAKE ONE TABLET BY MOUTH DAILY 90 tablet 3  . lisinopril (ZESTRIL) 20 MG tablet Take 1 tablet (20 mg total) by mouth daily. 90 tablet 3  . lovastatin (MEVACOR) 40 MG tablet Take 2 tablets (80 mg total) by mouth at bedtime. 180 tablet 3  . mupirocin cream (BACTROBAN) 2 % Apply 1 application topically 2 (two) times daily.    . pantoprazole (PROTONIX) 40 MG tablet Take 1 tablet (40 mg total) by mouth daily. 90 tablet 3  . SUMAtriptan (IMITREX) 100 MG tablet TAKE 1 TABLET BY  MOUTH EVERY 2 HOURS AS NEEDED FOR MIGRAINE OR HEADACHE, MAY REPEAT IN 2 HOURS IF HEADACHE PERSISTS OR RECURS 10 tablet 2  . triamcinolone cream (KENALOG) 0.1 % SMARTSIG:1 Application Topical 2-3 Times Daily     No current facility-administered medications on file prior to visit.        ROS:  All others reviewed and negative.  Objective        PE:  BP (!) 152/84   Pulse 65   Ht _0  (1.575 m)   Wt 182 lb (82.6 kg)   SpO2 99%   BMI 33.29 kg/m                 Constitutional: Pt appears in NAD               HENT: Head: NCAT.                Right Ear: External ear normal.                 Left Ear: External ear normal.                Eyes: . Pupils are equal, round, and reactive to light. Conjunctivae and EOM are normal               Nose: without d/c or deformity               Neck: Neck supple. Gross normal ROM               Cardiovascular: Normal rate and regular rhythm.                 Pulmonary/Chest: Effort normal and breath sounds without rales or wheezing.                Abd:  Soft, NT, ND, + BS, no organomegaly               Neurological: Pt is alert. At baseline orientation, motor grossly intact               Skin: Skin is warm. No rashes, no other new lesions, LE edema - none               Psychiatric: Pt behavior is  normal without agitation   Micro: none  Cardiac tracings I have personally interpreted today:  none  Pertinent Radiological findings (summarize): none   Lab Results  Component Value Date   WBC 5.8 05/10/2020   HGB 12.5 05/10/2020   HCT 37.5 05/10/2020   PLT 219.0 05/10/2020   GLUCOSE 102 (H) 05/10/2020   CHOL 179 05/10/2020   TRIG 103.0 05/10/2020   HDL 51.20 05/10/2020   LDLDIRECT 140.0 08/05/2018   LDLCALC 107 (H) 05/10/2020   ALT 14 05/10/2020   AST 16 05/10/2020   NA 141 05/10/2020   K 5.1 05/10/2020   CL 107 05/10/2020   CREATININE 2.01 (H) 05/10/2020   BUN 35 (H) 05/10/2020   CO2 25 05/10/2020   TSH 2.79 05/10/2020   INR 0.99 08/17/2014   HGBA1C 5.5 03/31/2020   MICROALBUR 3.3 (H) 05/01/2019   Assessment/Plan:  Heather Dawson is a 65 y.o. White or Caucasian [1] female with  has a past medical history of Abdominal pain, epigastric (12/20/2006), ALLERGIC RHINITIS (12/25/2006), Allergy, ANXIETY (10/16/2006), Arthritis, BACK PAIN (06/15/2008), Blood transfusion without reported diagnosis, BRADYCARDIA (05/04/2008), C. difficile diarrhea, Carpal tunnel syndrome (10/16/2006), Cellulitis and abscess of other specified site (06/15/2008), CHEST  PAIN (09/08/2009), Chronic low back pain, CIRRHOSIS (10/16/2006), CKD (chronic kidney disease), CKD (chronic kidney disease) stage 3, GFR 30-59 ml/min ([place]) (10/16/2006), COMMON MIGRAINE (45/80/9983), Complication of anesthesia, CONTUSION, LOWER LEG (09/15/2008), DEPRESSION (10/16/2006), Diabetes mellitus without complication ([place]), Esophageal reflux (12/25/2006), HYPERLIPIDEMIA (12/25/2006), Hyperlipidemia, HYPERTENSION (08/15/2006), Hypertension, HYPOTHYROIDISM (08/15/2006), Kidney failure, acute ([place]) (2009), Left-sided carotid artery disease ([place]), MIGRAINE HEADACHE (10/16/2006), Obesity (10/16/2006), OVARIAN CYST (12/20/2006), Psoriasis (01/03/2011), SHINGLES, HX OF (12/20/2006), Stroke ([place]), TRANSIENT ISCHEMIC ATTACKS, HX OF (12/20/2006), VERTIGO  (09/15/2007), and VITAMIN B12 DEFICIENCY (01/09/2007).  Encounter for well adult exam with abnormal findings Age and sex appropriate education and counseling updated with regular exercise and diet Referrals for preventative services - none needed Immunizations addressed - declines tdap Smoking counseling  - none needed Evidence for depression or other mood disorder - none significant Most recent labs reviewed. I have personally reviewed and have noted: 1) the patient's medical and social history 2) The patient's current medications and supplements 3) The patient's height, weight, and BMI have been recorded in the chart   Hyperlipidemia Lab Results  Component Value Date   LDLCALC 107 (H) 05/10/2020   Stable, pt to continue current statin lovastatin, decliens increased today with goal ldl < 70   Essential hypertension BP Readings from Last 3 Encounters:  05/10/20 (!) 152/84  04/12/20 (!) 196/110  03/31/20 (!) 165/71   Uncontrolled, for increased amlodipine 10, pt to continue other medical treatment  , consider add hct  Diabetes Lab Results  Component Value Date   HGBA1C 5.5 03/31/2020   Stable, pt to continue current medical treatment  - diet and wt control   CKD (chronic kidney disease) stage 3, GFR 30-59 ml/min Lab Results  Component Value Date   CREATININE 2.01 (H) 05/10/2020   Stable overall, cont to avoid nephrotoxins, f/u renal as planned  Low back pain Needs SI joint procedure but unable due to wt per pt - for phenetermine asd  B12 deficiency Improved since found low 1 yr ago,  Lab Results  Component Value Date   VITAMINB12 443 05/10/2020   Cont same tx  Followup: Return in about 3 months (around 08/10/2020).  [name], MD 05/18/2020 10:35 PM [facility]

## 2020-05-18 NOTE — Assessment & Plan Note (Signed)
Lab Results  Component Value Date   HGBA1C 5.5 03/31/2020   Stable, pt to continue current medical treatment  - diet and wt control

## 2020-05-18 NOTE — Assessment & Plan Note (Signed)
Lab Results  Component Value Date   CREATININE 2.01 (H) 05/10/2020   Stable overall, cont to avoid nephrotoxins, f/u renal as planned

## 2020-05-18 NOTE — Assessment & Plan Note (Signed)
Needs SI joint procedure but unable due to wt per pt - for phenetermine asd

## 2020-05-18 NOTE — Assessment & Plan Note (Signed)
BP Readings from Last 3 Encounters:  05/10/20 (!) 152/84  04/12/20 (!) 196/110  03/31/20 (!) 165/71   Uncontrolled, for increased amlodipine 10, pt to continue other medical treatment  , consider add hct

## 2020-05-18 NOTE — Assessment & Plan Note (Signed)
Lab Results  Component Value Date   LDLCALC 107 (H) 05/10/2020   Stable, pt to continue current statin lovastatin, decliens increased today with goal ldl < 70

## 2020-05-19 MED ORDER — PHENTERMINE HCL 30 MG PO CAPS: 30.0000 mg | ORAL_CAPSULE | ORAL | 2 refills | Status: DC

## 2020-05-23 DIAGNOSIS — N760 Acute vaginitis: Secondary | ICD-10-CM | POA: Diagnosis not present

## 2020-05-23 DIAGNOSIS — N390 Urinary tract infection, site not specified: Secondary | ICD-10-CM | POA: Diagnosis not present

## 2020-05-23 DIAGNOSIS — N952 Postmenopausal atrophic vaginitis: Secondary | ICD-10-CM | POA: Diagnosis not present

## 2020-05-23 DIAGNOSIS — N950 Postmenopausal bleeding: Secondary | ICD-10-CM | POA: Diagnosis not present

## 2020-05-30 DIAGNOSIS — M533 Sacrococcygeal disorders, not elsewhere classified: Secondary | ICD-10-CM | POA: Diagnosis not present

## 2020-06-01 ENCOUNTER — Telehealth: Payer: Medicare Other | Admitting: Internal Medicine

## 2020-06-09 ENCOUNTER — Other Ambulatory Visit: Payer: Self-pay | Admitting: Orthopedic Surgery

## 2020-06-09 ENCOUNTER — Encounter: Payer: Self-pay | Admitting: Internal Medicine

## 2020-06-09 DIAGNOSIS — M25551 Pain in right hip: Secondary | ICD-10-CM | POA: Diagnosis not present

## 2020-06-09 DIAGNOSIS — M545 Low back pain, unspecified: Secondary | ICD-10-CM | POA: Diagnosis not present

## 2020-06-09 DIAGNOSIS — M25559 Pain in unspecified hip: Secondary | ICD-10-CM

## 2020-06-13 ENCOUNTER — Telehealth: Payer: Self-pay | Admitting: Cardiovascular Disease

## 2020-06-13 NOTE — Telephone Encounter (Signed)
Pt c/o Shortness Of Breath: STAT if SOB developed within the last 24 hours or pt is noticeably SOB on the phone  1. Are you currently SOB (can you hear that pt is SOB on the phone)? yes  2. How long have you been experiencing SOB? Bout a week   3. Are you SOB when sitting or when up moving around? Moving around   4. Are you currently experiencing any other symptoms? Patient states when she was taking this medication phentermine 30 MG capsule she thought this was the reason for there lightheaded, nausea, and headache. Patient that it was a side effect from the medication but she stop taking the medication 4 weeks ago. Please avise

## 2020-06-13 NOTE — Telephone Encounter (Signed)
Spoke to patient she stated she stopped taking Phentermine 4 weeks ago.Stated she has been having sob with exertion ever since she stopped taking.She is also having occasional chest heaviness.Appointment was scheduled with Almyra Deforest PA 5/16 at 8:45 am.Advised if symptoms worsen go to ED.

## 2020-06-20 ENCOUNTER — Ambulatory Visit: Payer: Medicare Other | Admitting: Neurology

## 2020-06-22 ENCOUNTER — Other Ambulatory Visit: Payer: Self-pay

## 2020-06-22 ENCOUNTER — Ambulatory Visit
Admission: RE | Admit: 2020-06-22 | Discharge: 2020-06-22 | Disposition: A | Discharge status: Home / Self Care | Payer: Medicare Other | Source: Ambulatory Visit | Attending: Orthopedic Surgery | Admitting: Orthopedic Surgery

## 2020-06-22 DIAGNOSIS — M24151 Other articular cartilage disorders, right hip: Secondary | ICD-10-CM | POA: Diagnosis not present

## 2020-06-22 DIAGNOSIS — M1611 Unilateral primary osteoarthritis, right hip: Secondary | ICD-10-CM | POA: Diagnosis not present

## 2020-06-22 DIAGNOSIS — M545 Low back pain, unspecified: Secondary | ICD-10-CM | POA: Diagnosis not present

## 2020-06-22 DIAGNOSIS — R600 Localized edema: Secondary | ICD-10-CM | POA: Diagnosis not present

## 2020-06-22 DIAGNOSIS — M25559 Pain in unspecified hip: Secondary | ICD-10-CM

## 2020-06-23 DIAGNOSIS — M25551 Pain in right hip: Secondary | ICD-10-CM | POA: Diagnosis not present

## 2020-06-24 ENCOUNTER — Other Ambulatory Visit: Payer: Self-pay | Admitting: Internal Medicine

## 2020-06-24 DIAGNOSIS — I129 Hypertensive chronic kidney disease with stage 1 through stage 4 chronic kidney disease, or unspecified chronic kidney disease: Secondary | ICD-10-CM | POA: Diagnosis not present

## 2020-06-24 DIAGNOSIS — N183 Chronic kidney disease, stage 3 unspecified: Secondary | ICD-10-CM | POA: Diagnosis not present

## 2020-06-24 DIAGNOSIS — N179 Acute kidney failure, unspecified: Secondary | ICD-10-CM | POA: Diagnosis not present

## 2020-06-24 DIAGNOSIS — E785 Hyperlipidemia, unspecified: Secondary | ICD-10-CM | POA: Diagnosis not present

## 2020-06-24 NOTE — Telephone Encounter (Signed)
Please refill as per office routine med refill policy (all routine meds refilled for 3 mo or monthly per pt preference up to one year from last visit, then month to month grace period for 3 mo, then further med refills will have to be denied)  

## 2020-06-28 ENCOUNTER — Encounter: Payer: Self-pay | Admitting: Cardiovascular Disease

## 2020-06-28 ENCOUNTER — Other Ambulatory Visit: Payer: Self-pay

## 2020-06-28 ENCOUNTER — Ambulatory Visit: Payer: Medicare Other | Admitting: Cardiovascular Disease

## 2020-06-28 DIAGNOSIS — I10 Essential (primary) hypertension: Secondary | ICD-10-CM | POA: Diagnosis not present

## 2020-06-28 DIAGNOSIS — E782 Mixed hyperlipidemia: Secondary | ICD-10-CM

## 2020-06-28 DIAGNOSIS — R0789 Other chest pain: Secondary | ICD-10-CM

## 2020-06-28 NOTE — Assessment & Plan Note (Signed)
History of essential hypertension blood pressure measured today 126/74.  She is on amlodipine, hydralazine and lisinopril.

## 2020-06-28 NOTE — Patient Instructions (Signed)
Medication Instructions:  Your physician recommends that you continue on your current medications as directed. Please refer to the Current Medication list given to you today.  *If you need a refill on your cardiac medications before your next appointment, please call your pharmacy*  Testing/Procedures: Your physician has requested that you have an echocardiogram. Echocardiography is a painless test that uses sound waves to create images of your heart. It provides your doctor with information about the size and shape of your heart and how well your heart's chambers and valves are working. This procedure takes approximately one hour. There are no restrictions for this procedure. This procedure is done at [address].  Your physician has requested that you have a lexiscan myoview. For further information please visit [email or web address]. Please follow instruction sheet, as given. This will take place at [address], [address]  How to prepare for your Myocardial Perfusion Test:  Do not eat or drink 3 hours prior to your test, except you may have water.  Do not consume products containing caffeine (regular or decaffeinated) 12 hours prior to your test. (ex: coffee, chocolate, sodas, tea).  Do bring a list of your current medications with you.  If not listed below, you may take your medications as normal.  Do wear comfortable clothes (no dresses or overalls) and walking shoes, tennis shoes preferred (No heels or open toe shoes are allowed).  Do NOT wear cologne, perfume, aftershave, or lotions (deodorant is allowed).  The test will take approximately 3 to 4 hours to complete  If these instructions are not followed, your test will have to be rescheduled.     Follow-Up: At [facility], you and your health needs are our priority.  As part of our continuing mission to provide you with exceptional heart care, we have created designated Provider Care Teams.  These Care  Teams include your primary Cardiologist (physician) and Advanced Practice Providers (APPs -  Physician Assistants and Nurse Practitioners) who all work together to provide you with the care you need, when you need it.  We recommend signing up for the patient portal called "MyChart".  Sign up information is provided on this After Visit Summary.  MyChart is used to connect with patients for Virtual Visits (Telemedicine).  Patients are able to view lab/test results, encounter notes, upcoming appointments, etc.  Non-urgent messages can be sent to your provider as well.   To learn more about what you can do with MyChart, go to [email or web address].    Your next appointment:   2 month(s)  The format for your next appointment:   In Person  Provider:   Quay Burow, MD

## 2020-06-28 NOTE — Progress Notes (Signed)
06/28/2020 Heather Dawson   05/20/1955  [id number]  Primary Physician Biagio Borg, MD Primary Cardiologist: Lorretta Harp MD [name], [place]  HPI:  Heather Dawson is a 65 y.o. moderately overweight married Caucasian female mother of 2 children, grandmother and 4 grandchildren referred by Dr. Oneida Alar for cardiovascular clearance prior to elective left carotid endarterectomy.  She is accompanied by her husband Heather Dawson who is also a patient of mine.  Her primary care physician is Dr. [name]. I last saw her in the office is 08/10/14 Her cardiac risk factor profile is notable for treated hypertension and hyperlipidemia. Both her parents had myocardial infarctions and are deceased. Her brother had a myocardial infarction as well. 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 She has never had a heart attack. She denies chest pain or shortness of breath. She had carotid Dopplers performed recently that showed high-grade asymptomatic left internal carotid artery stenosis and was referred to Dr. Oneida Alar. She had a Myoview stress test performed 08/12/14 which was low risk with normal ejection fraction. She separately underwent uncomplicated elective left carotid endarterectomy.    Since I saw her 6 years ago she did undergo endarterectomy which was uncomplicated.  This is followed by Dr. Oneida Alar.  She has some hip pain which is preventing her from exercising and she has gained some weight back.  Over the last 6 to 8 weeks she has noticed some increasing dyspnea and exertional chest pressure.  Current Meds  Medication Sig  . acetaminophen (TYLENOL) 500 MG tablet Take 1,000 mg by mouth every 8 (eight) hours as needed (pain).  Marland Kitchen amLODipine (NORVASC) 10 MG tablet Take 1 tablet (10 mg total) by mouth daily.  Marland Kitchen aspirin 325 MG EC tablet Take 325 mg by mouth daily.  . Cholecalciferol (VITAMIN D3) 1000 units CAPS Take 2 capsules (2,000 Units total) by mouth  daily.  . clobetasol (TEMOVATE) 0.05 % external solution Apply 1 application topically daily as needed (scalp psoriasis).   Marland Kitchen diclofenac sodium (VOLTAREN) 1 % GEL Apply 4 g topically 4 (four) times daily as needed.  . hydrALAZINE (APRESOLINE) 100 MG tablet Take 1 tablet (100 mg total) by mouth 3 (three) times daily.  . ID NOW COVID-19 KIT See admin instructions.  Marland Kitchen levocetirizine (XYZAL) 5 MG tablet   . levothyroxine (SYNTHROID) 75 MCG tablet TAKE ONE TABLET BY MOUTH DAILY  . lisinopril (ZESTRIL) 20 MG tablet TAKE ONE TABLET BY MOUTH DAILY  . lovastatin (MEVACOR) 40 MG tablet Take 2 tablets (80 mg total) by mouth at bedtime.  . mupirocin cream (BACTROBAN) 2 % Apply 1 application topically 2 (two) times daily.  . pantoprazole (PROTONIX) 40 MG tablet Take 1 tablet (40 mg total) by mouth daily.  . SUMAtriptan (IMITREX) 100 MG tablet TAKE 1 TABLET BY MOUTH EVERY 2 HOURS AS NEEDED FOR MIGRAINE OR HEADACHE, MAY REPEAT IN 2 HOURS IF HEADACHE PERSISTS OR RECURS  . triamcinolone cream (KENALOG) 0.1 % SMARTSIG:1 Application Topical 2-3 Times Daily     Allergies  Allergen Reactions  . Bactrim [Sulfamethoxazole-Trimethoprim] Nausea And Vomiting  . Cefuroxime Axetil Swelling     edema/swelling  . Ciprofloxacin Swelling  . Doxycycline Nausea Only  . Levaquin [Levofloxacin In D5w] Other (See Comments)    GI upset  . Lipitor [Atorvastatin] Other (See Comments)    Myalgia   . Lyrica [Pregabalin] Other (See Comments)    HA, confusion, swelling  . Macrodantin [  Nitrofurantoin Macrocrystal] Other (See Comments)    GI upset, diarrhea and sob  . Gabapentin Rash    Social History   Socioeconomic History  . Marital status: Married    Spouse name: Heather Dawson  . Number of children: Not on file  . Years of education: Not on file  . Highest education level: Not on file  Occupational History  . Occupation: retired  Tobacco Use  . Smoking status: Former Research scientist (life sciences)  . Smokeless tobacco: Never Used  . Tobacco  comment: quit smoking over 30 years ago  Vaping Use  . Vaping Use: Never used  Substance and Sexual Activity  . Alcohol use: No  . Drug use: No  . Sexual activity: Not on file  Other Topics Concern  . Not on file  Social History Narrative   Lives with husband   Right Handed   Drinks 2-4 cups coffee   Social Determinants of Health   Financial Resource Strain: Low Risk   . Difficulty of Paying Living Expenses: Not hard at all  Food Insecurity: No Food Insecurity  . Worried About Charity fundraiser in the Last Year: Never true  . Ran Out of Food in the Last Year: Never true  Transportation Needs: No Transportation Needs  . Lack of Transportation (Medical): No  . Lack of Transportation (Non-Medical): No  Physical Activity: Sufficiently Active  . Days of Exercise per Week: 7 days  . Minutes of Exercise per Session: 30 min  Stress: No Stress Concern Present  . Feeling of Stress : Not at all  Social Connections: Socially Integrated  . Frequency of Communication with Friends and Family: More than three times a week  . Frequency of Social Gatherings with Friends and Family: More than three times a week  . Attends Religious Services: More than 4 times per year  . Active Member of Clubs or Organizations: Yes  . Attends Archivist Meetings: More than 4 times per year  . Marital Status: Married  Human resources officer Violence: Not on file     Review of Systems: General: negative for chills, fever, night sweats or weight changes.  Cardiovascular: negative for chest pain, dyspnea on exertion, edema, orthopnea, palpitations, paroxysmal nocturnal dyspnea or shortness of breath Dermatological: negative for rash Respiratory: negative for cough or wheezing Urologic: negative for hematuria Abdominal: negative for nausea, vomiting, diarrhea, bright red blood per rectum, melena, or hematemesis Neurologic: negative for visual changes, syncope, or dizziness All other systems reviewed and  are otherwise negative except as noted above.    Blood pressure 126/74, pulse 64, height 5' 2"  (1.575 m), weight 184 lb 12.8 oz (83.8 kg).  General appearance: alert and no distress Neck: no adenopathy, no JVD, supple, symmetrical, trachea midline, thyroid not enlarged, symmetric, no tenderness/mass/nodules and Soft left carotid bruit Lungs: clear to auscultation bilaterally Heart: Soft outflow tract murmur Extremities: extremities normal, atraumatic, no cyanosis or edema Pulses: 2+ and symmetric Skin: Skin color, texture, turgor normal. No rashes or lesions Neurologic: Alert and oriented X 3, normal strength and tone. Normal symmetric reflexes. Normal coordination and gait  EKG sinus rhythm at rate 64 without ST or T wave changes.  Personally reviewed this EKG.  ASSESSMENT AND PLAN:   Hyperlipidemia History of hyperlipidemia on statin therapy with lipid profile performed 05/10/2020 revealing total cholesterol 179, LDL 107 and HDL of 51  Essential hypertension History of essential hypertension blood pressure measured today 126/74.  She is on amlodipine, hydralazine and lisinopril.  CHEST PAIN History  of fairly new onset exertional chest pressure and dyspnea.  She did have a negative Myoview performed 08/12/2014 for preoperative clearance before elective left carotid endarterectomy.  I am going get a 2D echocardiogram and a Lexiscan Myoview stress test to further evaluate.      Lorretta Harp MD FACP,FACC,FAHA, [facility] 06/28/2020 11:36 AM

## 2020-06-28 NOTE — Assessment & Plan Note (Signed)
History of hyperlipidemia on statin therapy with lipid profile performed 05/10/2020 revealing total cholesterol 179, LDL 107 and HDL of 51

## 2020-06-28 NOTE — Assessment & Plan Note (Signed)
History of fairly new onset exertional chest pressure and dyspnea.  She did have a negative Myoview performed 08/12/2014 for preoperative clearance before elective left carotid endarterectomy.  I am going get a 2D echocardiogram and a Lexiscan Myoview stress test to further evaluate.

## 2020-06-30 ENCOUNTER — Telehealth (HOSPITAL_COMMUNITY): Payer: Self-pay | Admitting: *Deleted

## 2020-06-30 NOTE — Telephone Encounter (Signed)
Close encounter 

## 2020-07-01 ENCOUNTER — Ambulatory Visit (HOSPITAL_COMMUNITY)
Admission: RE | Admit: 2020-07-01 | Discharge: 2020-07-01 | Disposition: A | Discharge status: Home / Self Care | Payer: Medicare Other | Source: Ambulatory Visit | Attending: Cardiology | Admitting: Cardiology

## 2020-07-01 ENCOUNTER — Other Ambulatory Visit: Payer: Self-pay

## 2020-07-01 DIAGNOSIS — I10 Essential (primary) hypertension: Secondary | ICD-10-CM | POA: Diagnosis not present

## 2020-07-01 DIAGNOSIS — E782 Mixed hyperlipidemia: Secondary | ICD-10-CM | POA: Insufficient documentation

## 2020-07-01 DIAGNOSIS — R0789 Other chest pain: Secondary | ICD-10-CM | POA: Diagnosis not present

## 2020-07-01 LAB — MYOCARDIAL PERFUSION IMAGING
LV dias vol: 107 mL (ref 46–106)
LV sys vol: 45 mL
Peak HR: 111 {beats}/min
Rest HR: 70 {beats}/min
SDS: 2
SRS: 2
SSS: 4
TID: 1.22

## 2020-07-01 MED ORDER — TECHNETIUM TC 99M TETROFOSMIN IV KIT
10.3000 | PACK | Freq: Once | INTRAVENOUS | Status: AC | PRN
  Administered 2020-07-01: 10.3 via INTRAVENOUS
  Filled 2020-07-01: qty 11

## 2020-07-01 MED ORDER — TECHNETIUM TC 99M TETROFOSMIN IV KIT
31.7000 | PACK | Freq: Once | INTRAVENOUS | Status: AC | PRN
  Administered 2020-07-01: 31.7 via INTRAVENOUS
  Filled 2020-07-01: qty 32

## 2020-07-01 MED ORDER — REGADENOSON 0.4 MG/5ML IV SOLN
0.4000 mg | Freq: Once | INTRAVENOUS | Status: AC
  Administered 2020-07-01: 0.4 mg via INTRAVENOUS

## 2020-07-01 MED ORDER — AMINOPHYLLINE 25 MG/ML IV SOLN
75.0000 mg | Freq: Once | INTRAVENOUS | Status: AC
  Administered 2020-07-01: 75 mg via INTRAVENOUS

## 2020-07-04 ENCOUNTER — Ambulatory Visit: Payer: Medicare Other | Admitting: Physician Assistant

## 2020-07-04 DIAGNOSIS — M25551 Pain in right hip: Secondary | ICD-10-CM | POA: Diagnosis not present

## 2020-07-07 DIAGNOSIS — N183 Chronic kidney disease, stage 3 unspecified: Secondary | ICD-10-CM | POA: Diagnosis not present

## 2020-07-25 ENCOUNTER — Ambulatory Visit (HOSPITAL_COMMUNITY): Payer: Medicare Other | Attending: Cardiology

## 2020-07-25 ENCOUNTER — Other Ambulatory Visit: Payer: Self-pay

## 2020-07-25 DIAGNOSIS — R0789 Other chest pain: Secondary | ICD-10-CM | POA: Diagnosis not present

## 2020-07-25 DIAGNOSIS — I10 Essential (primary) hypertension: Secondary | ICD-10-CM | POA: Insufficient documentation

## 2020-07-25 DIAGNOSIS — E782 Mixed hyperlipidemia: Secondary | ICD-10-CM | POA: Diagnosis not present

## 2020-07-25 LAB — ECHOCARDIOGRAM COMPLETE
AR max vel: 1.56 cm2
AV Area VTI: 1.58 cm2
AV Area mean vel: 1.5 cm2
AV Mean grad: 19 mmHg
AV Peak grad: 20.4 mmHg
Ao pk vel: 2.26 m/s
Area-P 1/2: 3.17 cm2
S' Lateral: 2.9 cm

## 2020-08-04 DIAGNOSIS — H524 Presbyopia: Secondary | ICD-10-CM | POA: Diagnosis not present

## 2020-08-09 ENCOUNTER — Other Ambulatory Visit: Payer: Self-pay | Admitting: Internal Medicine

## 2020-08-12 ENCOUNTER — Other Ambulatory Visit: Payer: Self-pay

## 2020-08-12 ENCOUNTER — Ambulatory Visit (INDEPENDENT_AMBULATORY_CARE_PROVIDER_SITE_OTHER): Payer: Medicare Other | Admitting: Internal Medicine

## 2020-08-12 ENCOUNTER — Encounter: Payer: Self-pay | Admitting: Internal Medicine

## 2020-08-12 ENCOUNTER — Telehealth: Payer: Self-pay | Admitting: Internal Medicine

## 2020-08-12 VITALS — BP 146/82 | HR 71 | Temp 98.0°F | Ht 62.0 in | Wt 188.0 lb

## 2020-08-12 DIAGNOSIS — E782 Mixed hyperlipidemia: Secondary | ICD-10-CM | POA: Diagnosis not present

## 2020-08-12 DIAGNOSIS — E538 Deficiency of other specified B group vitamins: Secondary | ICD-10-CM

## 2020-08-12 DIAGNOSIS — E1165 Type 2 diabetes mellitus with hyperglycemia: Secondary | ICD-10-CM

## 2020-08-12 DIAGNOSIS — E559 Vitamin D deficiency, unspecified: Secondary | ICD-10-CM

## 2020-08-12 DIAGNOSIS — I700 Atherosclerosis of aorta: Secondary | ICD-10-CM | POA: Insufficient documentation

## 2020-08-12 DIAGNOSIS — N1832 Chronic kidney disease, stage 3b: Secondary | ICD-10-CM

## 2020-08-12 MED ORDER — VICTOZA 18 MG/3ML ~~LOC~~ SOPN: 1.8000 mg | PEN_INJECTOR | Freq: Every day | SUBCUTANEOUS | 3 refills | Status: DC

## 2020-08-12 MED ORDER — LOVASTATIN 40 MG PO TABS: 80.0000 mg | ORAL_TABLET | Freq: Every day | ORAL | 3 refills | Status: DC

## 2020-08-12 MED ORDER — VICTOZA 18 MG/3ML ~~LOC~~ SOPN: PEN_INJECTOR | SUBCUTANEOUS | 0 refills | Status: DC

## 2020-08-12 NOTE — Telephone Encounter (Signed)
   Patient calling to report she does NOT want an injectable medication  Declined liraglutide (VICTOZA) 18 MG/3ML SOPN

## 2020-08-12 NOTE — Assessment & Plan Note (Signed)
Lab Results  Component Value Date   VITAMINB12 443 05/10/2020   Stable, cont oral replacement - b12 1000 mcg qd

## 2020-08-12 NOTE — Assessment & Plan Note (Signed)
Pt encouraged for contd statin, low chol diet and excercise

## 2020-08-12 NOTE — Assessment & Plan Note (Signed)
Pt very frustrated, will add victoza dialy with starter titration, for lower calorie diet, f/u next visit

## 2020-08-12 NOTE — Assessment & Plan Note (Signed)
Lab Results  Component Value Date   HGBA1C 5.5 03/31/2020   Stable, pt to continue current medical treatment  - diet

## 2020-08-12 NOTE — Patient Instructions (Signed)
Please take all new medication as prescribed - the victoza for wt loss  Please continue all other medications as before, and refills have been done if requested.  Please have the pharmacy call with any other refills you may need.  Please continue your efforts at being more active, low cholesterol diet, and weight control.  You are otherwise up to date with prevention measures today.  Please keep your appointments with your specialists as you may have planned  Please make an Appointment to return in 3 months, or sooner if needed, also with Lab Appointment for testing done 3-5 days before at the [facility] (so this is for TWO appointments - please see the scheduling desk as you leave)  Due to the ongoing Covid 19 pandemic, our lab now requires an appointment for any labs done at our office.  If you need labs done and do not have an appointment, please call our office ahead of time to schedule before presenting to the lab for your testing.

## 2020-08-12 NOTE — Progress Notes (Addendum)
Patient ID: Heather Dawson, female   DOB: 02-Mar-1955, 65 y.o.   MRN: [id number]        Chief Complaint: follow up HTN, HLD and hyperglycemia and obesity       HPI:  Heather Dawson is a 65 y.o. female here with c/o unable to take the phentermine even the lower dose.  Wt is up another 4 lbs.  Has been very difficult to be more active with femail problems , then stroke scare, then doe for which she saw Dr Gwenlyn Found with findings of mild AS and GG1DD only - to f/u at one yr.  Also recurring right lowr back and hip pain that has slowed her down, was unable to get the SI joint infusion per Dr Rolena Infante, then saw Dr French Ana with cortisone injection in the right hip area (? [place]) that seemed to work well.  Has had recurring leg cramps sometime severe for unclear reasons, now takes otc magnesium and choline pills.  Only taking 1 x 40 mg lovastatin at night, but wiling to try the 80 mg. Has see renal recently and follows about 4 mo with D Colodonato.       Wt Readings from Last 3 Encounters:  08/12/20 188 lb (85.3 kg)  07/01/20 184 lb (83.5 kg)  06/28/20 184 lb 12.8 oz (83.8 kg)   BP Readings from Last 3 Encounters:  08/12/20 (!) 146/82  06/28/20 126/74  05/10/20 (!) 152/84         Past Medical History:  Diagnosis Date   Abdominal pain, epigastric 12/20/2006   ALLERGIC RHINITIS 12/25/2006   Allergy    ANXIETY 10/16/2006   no per pt   Arthritis    right shoulder   BACK PAIN 06/15/2008   Blood transfusion without reported diagnosis    BRADYCARDIA 05/04/2008   C. difficile diarrhea    Carpal tunnel syndrome 10/16/2006   Cellulitis and abscess of other specified site 06/15/2008   CHEST PAIN 09/08/2009   Chronic low back pain    CIRRHOSIS 10/16/2006   CKD (chronic kidney disease)    2008- had Ecoli and caused renal failure, no problems since then   CKD (chronic kidney disease) stage 3, GFR 30-59 ml/min ([place]) 10/16/2006   Qualifier: Diagnosis of  By: Jenny Reichmann MD, [place] 56/38/9373   Complication  of anesthesia    awake during 2 surgeries   CONTUSION, LOWER LEG 09/15/2008   DEPRESSION 10/16/2006   no per pt   Diabetes mellitus without complication ([place])    Esophageal reflux 12/25/2006   HYPERLIPIDEMIA 12/25/2006   Hyperlipidemia    HYPERTENSION 08/15/2006   Hypertension    HYPOTHYROIDISM 08/15/2006   Kidney failure, acute ([place]) 2009   as a result of severe E Coli infection   Left-sided carotid artery disease ([place])    MIGRAINE HEADACHE 10/16/2006   Obesity 10/16/2006   OVARIAN CYST 12/20/2006   Psoriasis 01/03/2011   SHINGLES, HX OF 12/20/2006   Stroke ([place])    3 strokes 2008-no deficits, only on ASA   TRANSIENT ISCHEMIC ATTACKS, HX OF 12/20/2006   VERTIGO 09/15/2007   VITAMIN B12 DEFICIENCY 01/09/2007   Past Surgical History:  Procedure Laterality Date   ABDOMINAL HYSTERECTOMY  02/2006   ANTERIOR CERVICAL DECOMP/DISCECTOMY FUSION  2005   CESAREAN SECTION     CHOLECYSTECTOMY     COLONOSCOPY     ENDARTERECTOMY Left 08/25/2014   Procedure: LEFT CAROTID ENDARTERECTOMY WITH HEMASHIELD PATCH ANGIOPLASTY;  Surgeon: Elam Dutch,  MD;  Location: [facility];  Service: Vascular;  Laterality: Left;   NECK SURGERY  2005   ROTATOR CUFF REPAIR Right 02/2014   Dr Veverly Fells    reports that she has quit smoking. She has never used smokeless tobacco. She reports that she does not drink alcohol and does not use drugs. family history includes Diabetes in her father, sister, and another family member; Heart disease in her brother, brother, father, and mother; Hypertension in her brother, brother and another family member; Lupus in her mother; Raynaud syndrome in her mother; Stroke in her father. Allergies  Allergen Reactions   Bactrim [Sulfamethoxazole-Trimethoprim] Nausea And Vomiting   Cefuroxime Axetil Swelling     edema/swelling   Ciprofloxacin Swelling   Doxycycline Nausea Only   Levaquin [Levofloxacin In D5w] Other (See Comments)    GI upset   Lipitor [Atorvastatin] Other (See Comments)     Myalgia    Lyrica [Pregabalin] Other (See Comments)    HA, confusion, swelling   Macrodantin [Nitrofurantoin Macrocrystal] Other (See Comments)    GI upset, diarrhea and sob   Phentermine Other (See Comments)    All the side effects listed on the paper   Gabapentin Rash   Current Outpatient Medications on File Prior to Visit  Medication Sig Dispense Refill   acetaminophen (TYLENOL) 500 MG tablet Take 1,000 mg by mouth every 8 (eight) hours as needed (pain).     amLODipine (NORVASC) 10 MG tablet Take 1 tablet (10 mg total) by mouth daily. 90 tablet 3   aspirin 325 MG EC tablet Take 325 mg by mouth daily.     Cholecalciferol (VITAMIN D3) 1000 units CAPS Take 2 capsules (2,000 Units total) by mouth daily. 60 capsule 0   clobetasol (TEMOVATE) 0.05 % external solution Apply 1 application topically daily as needed (scalp psoriasis).   0   diclofenac sodium (VOLTAREN) 1 % GEL Apply 4 g topically 4 (four) times daily as needed. 400 g 11   hydrALAZINE (APRESOLINE) 100 MG tablet Take 1 tablet (100 mg total) by mouth 3 (three) times daily. 90 tablet 11   ID NOW COVID-19 KIT See admin instructions.     levocetirizine (XYZAL) 5 MG tablet      levothyroxine (SYNTHROID) 75 MCG tablet TAKE ONE TABLET BY MOUTH DAILY 90 tablet 3   lisinopril (ZESTRIL) 20 MG tablet TAKE ONE TABLET BY MOUTH DAILY 90 tablet 3   mupirocin cream (BACTROBAN) 2 % Apply 1 application topically 2 (two) times daily.     pantoprazole (PROTONIX) 40 MG tablet Take 1 tablet (40 mg total) by mouth daily. 90 tablet 3   SUMAtriptan (IMITREX) 100 MG tablet TAKE ONE TABLET BY MOUTH AT ONSET OF MIGRAINE OR HEADACHE; MAY REPEAT ONE TABLET IN 2 HOURS IF NEEDED. 10 tablet 2   triamcinolone cream (KENALOG) 0.1 % SMARTSIG:1 Application Topical 2-3 Times Daily     No current facility-administered medications on file prior to visit.        ROS:  All others reviewed and negative.  Objective        PE:  BP (!) 146/82 (BP Location: Left Arm,  Patient Position: Sitting, Cuff Size: Large)   Pulse 71   Temp 98 F (36.7 C) (Oral)   Ht 5' 2" (1.575 m)   Wt 188 lb (85.3 kg)   SpO2 98%   BMI 34.39 kg/m                 Constitutional: Pt appears in NAD  HENT: Head: NCAT.                Right Ear: External ear normal.                 Left Ear: External ear normal.                Eyes: . Pupils are equal, round, and reactive to light. Conjunctivae and EOM are normal               Nose: without d/c or deformity               Neck: Neck supple. Gross normal ROM               Cardiovascular: Normal rate and regular rhythm.                 Pulmonary/Chest: Effort normal and breath sounds without rales or wheezing.                Abd:  Soft, NT, ND, + BS, no organomegaly               Neurological: Pt is alert. At baseline orientation, motor grossly intact               Skin: Skin is warm. No rashes, no other new lesions, LE edema  - none               Psychiatric: Pt behavior is normal without agitation   Micro: none  Cardiac tracings I have personally interpreted today:  none  Pertinent Radiological findings (summarize): none   Lab Results  Component Value Date   WBC 5.8 05/10/2020   HGB 12.5 05/10/2020   HCT 37.5 05/10/2020   PLT 219.0 05/10/2020   GLUCOSE 102 (H) 05/10/2020   CHOL 179 05/10/2020   TRIG 103.0 05/10/2020   HDL 51.20 05/10/2020   LDLDIRECT 140.0 08/05/2018   LDLCALC 107 (H) 05/10/2020   ALT 14 05/10/2020   AST 16 05/10/2020   NA 141 05/10/2020   K 5.1 05/10/2020   CL 107 05/10/2020   CREATININE 2.01 (H) 05/10/2020   BUN 35 (H) 05/10/2020   CO2 25 05/10/2020   TSH 2.79 05/10/2020   INR 0.99 08/17/2014   HGBA1C 5.5 03/31/2020   MICROALBUR 3.3 (H) 05/01/2019   Assessment/Plan:  Heather Dawson is a 65 y.o. White or Caucasian [1] female with  has a past medical history of Abdominal pain, epigastric (12/20/2006), ALLERGIC RHINITIS (12/25/2006), Allergy, ANXIETY (10/16/2006), Arthritis, BACK  PAIN (06/15/2008), Blood transfusion without reported diagnosis, BRADYCARDIA (05/04/2008), C. difficile diarrhea, Carpal tunnel syndrome (10/16/2006), Cellulitis and abscess of other specified site (06/15/2008), CHEST PAIN (09/08/2009), Chronic low back pain, CIRRHOSIS (10/16/2006), CKD (chronic kidney disease), CKD (chronic kidney disease) stage 3, GFR 30-59 ml/min ([facility]) (10/16/2006), COMMON MIGRAINE (76/73/4193), Complication of anesthesia, CONTUSION, LOWER LEG (09/15/2008), DEPRESSION (10/16/2006), Diabetes mellitus without complication ([place]), Esophageal reflux (12/25/2006), HYPERLIPIDEMIA (12/25/2006), Hyperlipidemia, HYPERTENSION (08/15/2006), Hypertension, HYPOTHYROIDISM (08/15/2006), Kidney failure, acute ([place]) (2009), Left-sided carotid artery disease ([place]), MIGRAINE HEADACHE (10/16/2006), Obesity (10/16/2006), OVARIAN CYST (12/20/2006), Psoriasis (01/03/2011), SHINGLES, HX OF (12/20/2006), Stroke ([place]), TRANSIENT ISCHEMIC ATTACKS, HX OF (12/20/2006), VERTIGO (09/15/2007), and VITAMIN B12 DEFICIENCY (01/09/2007).  B12 deficiency Lab Results  Component Value Date   [id number] 329 05/10/2020   Stable, cont oral replacement - b12 1000 mcg qd   CKD (chronic kidney disease) stage 3, GFR 30-59 ml/min Mild worsening by last check here in mar, but pt states renal  says overall relatively stable, will hold further lab today since done recently, but f/u next as well. Lab Results  Component Value Date   CREATININE 2.01 (H) 05/10/2020   Stable overall, cont to avoid nephrotoxins   Diabetes Lab Results  Component Value Date   HGBA1C 5.5 03/31/2020   Stable, pt to continue current medical treatment  - diet   Hyperlipidemia Lab Results  Component Value Date   LDLCALC 107 (H) 05/10/2020   uncontrolled, pt inadvertantly only taking 40 mg lovastatin at bedtime so ok to increase the 80 mg  And low chol diet  Current Outpatient Medications (Endocrine & Metabolic):    levothyroxine (SYNTHROID) 75 MCG tablet,  TAKE ONE TABLET BY MOUTH DAILY   liraglutide (VICTOZA) 18 MG/3ML SOPN, Start 0.59m SQ once a day for 7 days, then increase to 1.280monce a day   liraglutide (VICTOZA) 18 MG/3ML SOPN, Inject 1.8 mg into the skin daily.  Current Outpatient Medications (Cardiovascular):    amLODipine (NORVASC) 10 MG tablet, Take 1 tablet (10 mg total) by mouth daily.   hydrALAZINE (APRESOLINE) 100 MG tablet, Take 1 tablet (100 mg total) by mouth 3 (three) times daily.   lisinopril (ZESTRIL) 20 MG tablet, TAKE ONE TABLET BY MOUTH DAILY   lovastatin (MEVACOR) 40 MG tablet, Take 2 tablets (80 mg total) by mouth at bedtime.  Current Outpatient Medications (Respiratory):    levocetirizine (XYZAL) 5 MG tablet,   Current Outpatient Medications (Analgesics):    acetaminophen (TYLENOL) 500 MG tablet, Take 1,000 mg by mouth every 8 (eight) hours as needed (pain).   aspirin 325 MG EC tablet, Take 325 mg by mouth daily.   SUMAtriptan (IMITREX) 100 MG tablet, TAKE ONE TABLET BY MOUTH AT ONSET OF MIGRAINE OR HEADACHE; MAY REPEAT ONE TABLET IN 2 HOURS IF NEEDED.   Current Outpatient Medications (Other):    Cholecalciferol (VITAMIN D3) 1000 units CAPS, Take 2 capsules (2,000 Units total) by mouth daily.   clobetasol (TEMOVATE) 0.05 % external solution, Apply 1 application topically daily as needed (scalp psoriasis).    diclofenac sodium (VOLTAREN) 1 % GEL, Apply 4 g topically 4 (four) times daily as needed.   ID NOW COVID-19 KIT, See admin instructions.   mupirocin cream (BACTROBAN) 2 %, Apply 1 application topically 2 (two) times daily.   pantoprazole (PROTONIX) 40 MG tablet, Take 1 tablet (40 mg total) by mouth daily.   triamcinolone cream (KENALOG) 0.1 %, SMARTSIG:1 Application Topical 2-3 Times Daily   Morbid obesity ([facility]) Pt very frustrated, will add victoza dialy with starter titration, for lower calorie diet, f/u next visit  Aortic atherosclerosis ([place]Pt encouraged for contd statin, low chol diet and  excercise  Followup: Return in about 3 months (around 11/12/2020).  JaCathlean CowerMD 08/12/2020 9:50 AM [facility]

## 2020-08-12 NOTE — Assessment & Plan Note (Signed)
Mild worsening by last check here in mar, but pt states renal says overall relatively stable, will hold further lab today since done recently, but f/u next as well. Lab Results  Component Value Date   CREATININE 2.01 (H) 05/10/2020   Stable overall, cont to avoid nephrotoxins

## 2020-08-12 NOTE — Assessment & Plan Note (Signed)
Lab Results  Component Value Date   LDLCALC 107 (H) 05/10/2020   uncontrolled, pt inadvertantly only taking 40 mg lovastatin at bedtime so ok to increase the 80 mg  And low chol diet  Current Outpatient Medications (Endocrine & Metabolic):  .  levothyroxine (SYNTHROID) 75 MCG tablet, TAKE ONE TABLET BY MOUTH DAILY .  liraglutide (VICTOZA) 18 MG/3ML SOPN, Start 0.72m SQ once a day for 7 days, then increase to 1.266monce a day .  liraglutide (VICTOZA) 18 MG/3ML SOPN, Inject 1.8 mg into the skin daily.  Current Outpatient Medications (Cardiovascular):  .  amLODipine (NORVASC) 10 MG tablet, Take 1 tablet (10 mg total) by mouth daily. .  hydrALAZINE (APRESOLINE) 100 MG tablet, Take 1 tablet (100 mg total) by mouth 3 (three) times daily. . Marland Kitchenlisinopril (ZESTRIL) 20 MG tablet, TAKE ONE TABLET BY MOUTH DAILY .  lovastatin (MEVACOR) 40 MG tablet, Take 2 tablets (80 mg total) by mouth at bedtime.  Current Outpatient Medications (Respiratory):  .  levocetirizine (XYZAL) 5 MG tablet,   Current Outpatient Medications (Analgesics):  .  acetaminophen (TYLENOL) 500 MG tablet, Take 1,000 mg by mouth every 8 (eight) hours as needed (pain). . Marland Kitchenaspirin 325 MG EC tablet, Take 325 mg by mouth daily. .  SUMAtriptan (IMITREX) 100 MG tablet, TAKE ONE TABLET BY MOUTH AT ONSET OF MIGRAINE OR HEADACHE; MAY REPEAT ONE TABLET IN 2 HOURS IF NEEDED.   Current Outpatient Medications (Other):  . Marland KitchenCholecalciferol (VITAMIN D3) 1000 units CAPS, Take 2 capsules (2,000 Units total) by mouth daily. .  clobetasol (TEMOVATE) 0.05 % external solution, Apply 1 application topically daily as needed (scalp psoriasis).  . Marland Kitchendiclofenac sodium (VOLTAREN) 1 % GEL, Apply 4 g topically 4 (four) times daily as needed. .  ID NOW COVID-19 KIT, See admin instructions. .  mupirocin cream (BACTROBAN) 2 %, Apply 1 application topically 2 (two) times daily. .  pantoprazole (PROTONIX) 40 MG tablet, Take 1 tablet (40 mg total) by mouth daily. . Marland Kitchen triamcinolone cream (KENALOG) 0.1 %, SMARTSIG:1 Application Topical 2-3 Times Daily

## 2020-08-15 NOTE — Telephone Encounter (Signed)
error 

## 2020-08-18 ENCOUNTER — Encounter: Payer: Self-pay | Admitting: Internal Medicine

## 2020-08-18 MED ORDER — VICTOZA 18 MG/3ML ~~LOC~~ SOPN: PEN_INJECTOR | SUBCUTANEOUS | 0 refills | Status: DC

## 2020-08-18 MED ORDER — VICTOZA 18 MG/3ML ~~LOC~~ SOPN: 1.8000 mg | PEN_INJECTOR | Freq: Every day | SUBCUTANEOUS | 3 refills | Status: DC

## 2020-08-19 ENCOUNTER — Telehealth: Payer: Self-pay | Admitting: Internal Medicine

## 2020-08-19 ENCOUNTER — Ambulatory Visit: Payer: Medicare Other | Admitting: Cardiovascular Disease

## 2020-08-19 ENCOUNTER — Encounter: Payer: Self-pay | Admitting: Internal Medicine

## 2020-08-19 ENCOUNTER — Encounter: Payer: Self-pay | Admitting: Cardiovascular Disease

## 2020-08-19 ENCOUNTER — Other Ambulatory Visit: Payer: Self-pay

## 2020-08-19 VITALS — BP 150/82 | HR 84 | Ht 62.0 in | Wt 190.6 lb

## 2020-08-19 DIAGNOSIS — R0789 Other chest pain: Secondary | ICD-10-CM | POA: Diagnosis not present

## 2020-08-19 NOTE — Telephone Encounter (Signed)
Spoke with [employer] to answer questions regarding PA for Victoza. PA has been complete.

## 2020-08-19 NOTE — Patient Instructions (Signed)
  Follow-Up: At [facility], you and your health needs are our priority.  As part of our continuing mission to provide you with exceptional heart care, we have created designated Provider Care Teams.  These Care Teams include your primary Cardiologist (physician) and Advanced Practice Providers (APPs -  Physician Assistants and Nurse Practitioners) who all work together to provide you with the care you need, when you need it.  We recommend signing up for the patient portal called "MyChart".  Sign up information is provided on this After Visit Summary.  MyChart is used to connect with patients for Virtual Visits (Telemedicine).  Patients are able to view lab/test results, encounter notes, upcoming appointments, etc.  Non-urgent messages can be sent to your provider as well.   To learn more about what you can do with MyChart, go to [email or web address].    Your next appointment:   6 month(s)  The format for your next appointment:   In Person  Provider:   Quay Burow, MD

## 2020-08-19 NOTE — Telephone Encounter (Signed)
   [employer] called in regards to PA for liraglutide ([place]) 18 MG/3ML SOPN    Phone: [phone]; option 5 Key [id number]

## 2020-08-19 NOTE — Progress Notes (Signed)
Ms. Crespo returns today for follow-up of her noninvasive tests performed in the evaluation of dyspnea and atypical chest pain.  Her 2D echo performed 07/25/2020 revealed normal LV systolic function with mild aortic stenosis.  Her Myoview stress test performed 07/01/2020 was low risk and nonischemic.  She has no strength according to her and relates many of her symptoms from back when she took her COVID shots.  At this point, I feel comfortable that her symptoms are not cardiovascular nature and we will see her back in 6 months for follow-up.  Lorretta Harp, M.D., [place], [place] [address] [place], [place]  [address]  [phone] 08/19/2020 3:17 PM

## 2020-08-24 MED ORDER — NOVOFINE PEN NEEDLE 32G X 6 MM MISC: 3 refills | Status: DC

## 2020-08-24 NOTE — Telephone Encounter (Signed)
Overall all the years I have been rx victoza I have never had to separately rx the needles, so I am not sure which needle to rx  Can we clarify with the pharmacy which needle would be appropriate?  thanks

## 2020-08-24 NOTE — Telephone Encounter (Signed)
Prior Authorization for Victoza has been approved and patient notified.  Per patient, the pen did not come with any needles and pharmacy states that a separate prescription needs to be sent.

## 2020-08-24 NOTE — Telephone Encounter (Signed)
Pharmacy states that insurance requires a prescription for the pen needles. Novofine 32G disposable needles

## 2020-08-24 NOTE — Addendum Note (Signed)
Addended by: Biagio Borg on: 08/24/2020 05:01 PM   Modules accepted: Orders

## 2020-09-02 ENCOUNTER — Encounter: Payer: Self-pay | Admitting: Internal Medicine

## 2020-09-07 ENCOUNTER — Telehealth: Payer: Self-pay | Admitting: Internal Medicine

## 2020-09-07 NOTE — Telephone Encounter (Signed)
Patient calling for advice for constipation. She states she has not had a BM since last week   Please advise

## 2020-09-08 NOTE — Telephone Encounter (Signed)
Message left for patient today with recommendations.

## 2020-09-08 NOTE — Telephone Encounter (Signed)
Otc miralax even once every day is safe and usually effective

## 2020-09-10 ENCOUNTER — Encounter (HOSPITAL_COMMUNITY): Payer: Self-pay | Admitting: Emergency Medicine

## 2020-09-10 ENCOUNTER — Emergency Department (HOSPITAL_COMMUNITY)
Admission: EM | Admit: 2020-09-10 | Discharge: 2020-09-11 | Disposition: A | Discharge status: Home / Self Care | Payer: Medicare Other | Attending: Student | Admitting: Student

## 2020-09-10 DIAGNOSIS — Z5321 Procedure and treatment not carried out due to patient leaving prior to being seen by health care provider: Secondary | ICD-10-CM | POA: Diagnosis not present

## 2020-09-10 DIAGNOSIS — R1030 Lower abdominal pain, unspecified: Secondary | ICD-10-CM | POA: Diagnosis not present

## 2020-09-10 DIAGNOSIS — I10 Essential (primary) hypertension: Secondary | ICD-10-CM | POA: Diagnosis not present

## 2020-09-10 DIAGNOSIS — K5900 Constipation, unspecified: Secondary | ICD-10-CM | POA: Insufficient documentation

## 2020-09-10 DIAGNOSIS — R1084 Generalized abdominal pain: Secondary | ICD-10-CM | POA: Diagnosis not present

## 2020-09-10 DIAGNOSIS — R109 Unspecified abdominal pain: Secondary | ICD-10-CM | POA: Diagnosis not present

## 2020-09-10 LAB — URINALYSIS, ROUTINE W REFLEX MICROSCOPIC
Bilirubin Urine: NEGATIVE
Glucose, UA: NEGATIVE mg/dL
Hgb urine dipstick: NEGATIVE
Ketones, ur: NEGATIVE mg/dL
Leukocytes,Ua: NEGATIVE
Nitrite: NEGATIVE
Protein, ur: NEGATIVE mg/dL
Specific Gravity, Urine: 1.014 (ref 1.005–1.030)
pH: 6 (ref 5.0–8.0)

## 2020-09-10 LAB — CBC WITH DIFFERENTIAL/PLATELET
Abs Immature Granulocytes: 0.02 10*3/uL (ref 0.00–0.07)
Basophils Absolute: 0 10*3/uL (ref 0.0–0.1)
Basophils Relative: 0 %
Eosinophils Absolute: 0.2 10*3/uL (ref 0.0–0.5)
Eosinophils Relative: 2 %
HCT: 38 % (ref 36.0–46.0)
Hemoglobin: 12.9 g/dL (ref 12.0–15.0)
Immature Granulocytes: 0 %
Lymphocytes Relative: 18 %
Lymphs Abs: 1.6 10*3/uL (ref 0.7–4.0)
MCH: 32.8 pg (ref 26.0–34.0)
MCHC: 33.9 g/dL (ref 30.0–36.0)
MCV: 96.7 fL (ref 80.0–100.0)
Monocytes Absolute: 0.6 10*3/uL (ref 0.1–1.0)
Monocytes Relative: 6 %
Neutro Abs: 6.6 10*3/uL (ref 1.7–7.7)
Neutrophils Relative %: 74 %
Platelets: 261 10*3/uL (ref 150–400)
RBC: 3.93 MIL/uL (ref 3.87–5.11)
RDW: 11.7 % (ref 11.5–15.5)
WBC: 9 10*3/uL (ref 4.0–10.5)
nRBC: 0 % (ref 0.0–0.2)

## 2020-09-10 LAB — COMPREHENSIVE METABOLIC PANEL
ALT: 14 U/L (ref 0–44)
AST: 18 U/L (ref 15–41)
Albumin: 4.1 g/dL (ref 3.5–5.0)
Alkaline Phosphatase: 47 U/L (ref 38–126)
Anion gap: 10 (ref 5–15)
BUN: 36 mg/dL — ABNORMAL HIGH (ref 8–23)
CO2: 24 mmol/L (ref 22–32)
Calcium: 9.2 mg/dL (ref 8.9–10.3)
Chloride: 106 mmol/L (ref 98–111)
Creatinine, Ser: 2.34 mg/dL — ABNORMAL HIGH (ref 0.44–1.00)
GFR, Estimated: 23 mL/min — ABNORMAL LOW (ref >60)
Glucose, Bld: 103 mg/dL — ABNORMAL HIGH (ref 70–99)
Potassium: 4.3 mmol/L (ref 3.5–5.1)
Sodium: 140 mmol/L (ref 135–145)
Total Bilirubin: 0.7 mg/dL (ref 0.3–1.2)
Total Protein: 6.5 g/dL (ref 6.5–8.1)

## 2020-09-10 LAB — LIPASE, BLOOD: Lipase: 43 U/L (ref 11–51)

## 2020-09-10 MED ORDER — MORPHINE SULFATE (PF) 4 MG/ML IV SOLN
4.0000 mg | Freq: Once | INTRAVENOUS | Status: AC
  Administered 2020-09-10: 4 mg via INTRAVENOUS
  Filled 2020-09-10: qty 1

## 2020-09-10 MED ORDER — ONDANSETRON HCL 4 MG/2ML IJ SOLN
4.0000 mg | Freq: Once | INTRAMUSCULAR | Status: AC
  Administered 2020-09-10: 4 mg via INTRAVENOUS
  Filled 2020-09-10: qty 2

## 2020-09-10 NOTE — ED Triage Notes (Signed)
Pt arrives via EMS from home with generalized abd pain. Pt reports no BM for 2 weeks and has attempted OTC meds with no relief.

## 2020-09-10 NOTE — ED Provider Notes (Cosign Needed)
Emergency Medicine Provider Triage Evaluation Note  Heather Dawson , a 65 y.o. female  was evaluated in triage.  Pt complains of abdominal pain/cramping and constipation.  Symptoms have been going on for 2 weeks.  She had severe worsening pain a few days ago after trying to eat dinner.  Today she tried to drink liquids and it caused worsening pain.  She normally has a daily bowel movements, very regular to go 2 weeks.  She is not having a lot of pain in her rectum, just has no urge to go.  No urinary symptoms.  No fevers, chills, nausea, vomiting.  Previous history of C-section, but no other abdominal surgeries.  Review of Systems  Positive: Abd pain, constipation Negative: fever  Physical Exam  BP 140/67   Pulse 77   Temp 98.8 F (37.1 C) (Oral)   Resp 18   Ht 5\' 2"  (1.575 m)   Wt 86.2 kg   SpO2 100%   BMI 34.75 kg/m  Gen:   Awake, appears uncomfortable due to pain Resp:  Normal effort  MSK:   Moves extremities without difficulty  Other:    Medical Decision Making  Medically screening exam initiated at 3:46 PM.  Appropriate orders placed.  Heather Dawson was informed that the remainder of the evaluation will be completed by another provider, this initial triage assessment does not replace that evaluation, and the importance of remaining in the ED until their evaluation is complete.  Labs, ct, meds ordered   Franchot Heidelberg, PA-C 09/10/20 1547

## 2020-09-11 NOTE — ED Notes (Addendum)
PT left, IV removed before departing AMA.

## 2020-09-13 ENCOUNTER — Ambulatory Visit: Payer: Medicare Other | Admitting: Cardiovascular Disease

## 2020-09-15 ENCOUNTER — Other Ambulatory Visit: Payer: Self-pay | Admitting: Internal Medicine

## 2020-09-22 ENCOUNTER — Other Ambulatory Visit: Payer: Self-pay | Admitting: Internal Medicine

## 2020-09-26 DIAGNOSIS — L400 Psoriasis vulgaris: Secondary | ICD-10-CM | POA: Diagnosis not present

## 2020-10-12 ENCOUNTER — Telehealth: Payer: Self-pay | Admitting: Emergency Medicine

## 2020-10-12 ENCOUNTER — Encounter (HOSPITAL_BASED_OUTPATIENT_CLINIC_OR_DEPARTMENT_OTHER): Payer: Self-pay | Admitting: Emergency Medicine

## 2020-10-12 ENCOUNTER — Other Ambulatory Visit: Payer: Self-pay

## 2020-10-12 ENCOUNTER — Emergency Department (HOSPITAL_BASED_OUTPATIENT_CLINIC_OR_DEPARTMENT_OTHER): Payer: Medicare Other

## 2020-10-12 ENCOUNTER — Inpatient Hospital Stay (HOSPITAL_BASED_OUTPATIENT_CLINIC_OR_DEPARTMENT_OTHER)
Admission: EM | Admit: 2020-10-12 | Discharge: 2020-10-16 | DRG: 394 | Disposition: A | Discharge status: Home / Self Care | Payer: Medicare Other | Attending: Internal Medicine | Admitting: Internal Medicine

## 2020-10-12 DIAGNOSIS — K5790 Diverticulosis of intestine, part unspecified, without perforation or abscess without bleeding: Secondary | ICD-10-CM | POA: Diagnosis not present

## 2020-10-12 DIAGNOSIS — Z79890 Hormone replacement therapy (postmenopausal): Secondary | ICD-10-CM | POA: Diagnosis not present

## 2020-10-12 DIAGNOSIS — E669 Obesity, unspecified: Secondary | ICD-10-CM | POA: Diagnosis present

## 2020-10-12 DIAGNOSIS — E039 Hypothyroidism, unspecified: Secondary | ICD-10-CM | POA: Diagnosis present

## 2020-10-12 DIAGNOSIS — E1122 Type 2 diabetes mellitus with diabetic chronic kidney disease: Secondary | ICD-10-CM

## 2020-10-12 DIAGNOSIS — I700 Atherosclerosis of aorta: Secondary | ICD-10-CM | POA: Diagnosis not present

## 2020-10-12 DIAGNOSIS — D72829 Elevated white blood cell count, unspecified: Secondary | ICD-10-CM | POA: Diagnosis present

## 2020-10-12 DIAGNOSIS — Z87891 Personal history of nicotine dependence: Secondary | ICD-10-CM | POA: Diagnosis not present

## 2020-10-12 DIAGNOSIS — E1169 Type 2 diabetes mellitus with other specified complication: Secondary | ICD-10-CM

## 2020-10-12 DIAGNOSIS — Z86010 Personal history of colonic polyps: Secondary | ICD-10-CM

## 2020-10-12 DIAGNOSIS — Z794 Long term (current) use of insulin: Secondary | ICD-10-CM

## 2020-10-12 DIAGNOSIS — Z881 Allergy status to other antibiotic agents status: Secondary | ICD-10-CM | POA: Diagnosis not present

## 2020-10-12 DIAGNOSIS — D638 Anemia in other chronic diseases classified elsewhere: Secondary | ICD-10-CM | POA: Diagnosis not present

## 2020-10-12 DIAGNOSIS — Z90710 Acquired absence of both cervix and uterus: Secondary | ICD-10-CM

## 2020-10-12 DIAGNOSIS — Z8673 Personal history of transient ischemic attack (TIA), and cerebral infarction without residual deficits: Secondary | ICD-10-CM

## 2020-10-12 DIAGNOSIS — K5730 Diverticulosis of large intestine without perforation or abscess without bleeding: Secondary | ICD-10-CM | POA: Diagnosis present

## 2020-10-12 DIAGNOSIS — K559 Vascular disorder of intestine, unspecified: Secondary | ICD-10-CM | POA: Diagnosis not present

## 2020-10-12 DIAGNOSIS — Z20822 Contact with and (suspected) exposure to covid-19: Secondary | ICD-10-CM | POA: Diagnosis not present

## 2020-10-12 DIAGNOSIS — D62 Acute posthemorrhagic anemia: Secondary | ICD-10-CM | POA: Diagnosis present

## 2020-10-12 DIAGNOSIS — K922 Gastrointestinal hemorrhage, unspecified: Secondary | ICD-10-CM | POA: Diagnosis not present

## 2020-10-12 DIAGNOSIS — Z8619 Personal history of other infectious and parasitic diseases: Secondary | ICD-10-CM

## 2020-10-12 DIAGNOSIS — I10 Essential (primary) hypertension: Secondary | ICD-10-CM | POA: Diagnosis not present

## 2020-10-12 DIAGNOSIS — N184 Chronic kidney disease, stage 4 (severe): Secondary | ICD-10-CM

## 2020-10-12 DIAGNOSIS — Z823 Family history of stroke: Secondary | ICD-10-CM | POA: Diagnosis not present

## 2020-10-12 DIAGNOSIS — Z8249 Family history of ischemic heart disease and other diseases of the circulatory system: Secondary | ICD-10-CM

## 2020-10-12 DIAGNOSIS — K625 Hemorrhage of anus and rectum: Secondary | ICD-10-CM | POA: Diagnosis not present

## 2020-10-12 DIAGNOSIS — Z833 Family history of diabetes mellitus: Secondary | ICD-10-CM

## 2020-10-12 DIAGNOSIS — K529 Noninfective gastroenteritis and colitis, unspecified: Secondary | ICD-10-CM | POA: Diagnosis present

## 2020-10-12 DIAGNOSIS — Z888 Allergy status to other drugs, medicaments and biological substances status: Secondary | ICD-10-CM

## 2020-10-12 DIAGNOSIS — Z7982 Long term (current) use of aspirin: Secondary | ICD-10-CM | POA: Diagnosis not present

## 2020-10-12 DIAGNOSIS — R1032 Left lower quadrant pain: Secondary | ICD-10-CM | POA: Diagnosis not present

## 2020-10-12 DIAGNOSIS — K5150 Left sided colitis without complications: Secondary | ICD-10-CM | POA: Diagnosis not present

## 2020-10-12 DIAGNOSIS — Z79899 Other long term (current) drug therapy: Secondary | ICD-10-CM

## 2020-10-12 DIAGNOSIS — I129 Hypertensive chronic kidney disease with stage 1 through stage 4 chronic kidney disease, or unspecified chronic kidney disease: Secondary | ICD-10-CM | POA: Diagnosis not present

## 2020-10-12 DIAGNOSIS — D631 Anemia in chronic kidney disease: Secondary | ICD-10-CM | POA: Diagnosis present

## 2020-10-12 DIAGNOSIS — R1084 Generalized abdominal pain: Secondary | ICD-10-CM | POA: Diagnosis not present

## 2020-10-12 DIAGNOSIS — R933 Abnormal findings on diagnostic imaging of other parts of digestive tract: Secondary | ICD-10-CM | POA: Diagnosis not present

## 2020-10-12 LAB — COMPREHENSIVE METABOLIC PANEL
ALT: 8 U/L (ref 0–44)
AST: 13 U/L — ABNORMAL LOW (ref 15–41)
Albumin: 4.4 g/dL (ref 3.5–5.0)
Alkaline Phosphatase: 42 U/L (ref 38–126)
Anion gap: 11 (ref 5–15)
BUN: 38 mg/dL — ABNORMAL HIGH (ref 8–23)
CO2: 22 mmol/L (ref 22–32)
Calcium: 9.5 mg/dL (ref 8.9–10.3)
Chloride: 106 mmol/L (ref 98–111)
Creatinine, Ser: 2.21 mg/dL — ABNORMAL HIGH (ref 0.44–1.00)
GFR, Estimated: 24 mL/min — ABNORMAL LOW (ref >60)
Glucose, Bld: 119 mg/dL — ABNORMAL HIGH (ref 70–99)
Potassium: 4.2 mmol/L (ref 3.5–5.1)
Sodium: 139 mmol/L (ref 135–145)
Total Bilirubin: 0.6 mg/dL (ref 0.3–1.2)
Total Protein: 6.6 g/dL (ref 6.5–8.1)

## 2020-10-12 LAB — CBC
HCT: 38.7 % (ref 36.0–46.0)
Hemoglobin: 13 g/dL (ref 12.0–15.0)
MCH: 31.7 pg (ref 26.0–34.0)
MCHC: 33.6 g/dL (ref 30.0–36.0)
MCV: 94.4 fL (ref 80.0–100.0)
Platelets: 252 10*3/uL (ref 150–400)
RBC: 4.1 MIL/uL (ref 3.87–5.11)
RDW: 11.9 % (ref 11.5–15.5)
WBC: 12.2 10*3/uL — ABNORMAL HIGH (ref 4.0–10.5)
nRBC: 0 % (ref 0.0–0.2)

## 2020-10-12 LAB — HEMOGLOBIN AND HEMATOCRIT, BLOOD
HCT: 37.2 % (ref 36.0–46.0)
Hemoglobin: 12.4 g/dL (ref 12.0–15.0)

## 2020-10-12 LAB — RESP PANEL BY RT-PCR (FLU A&B, COVID) ARPGX2
Influenza A by PCR: NEGATIVE
Influenza B by PCR: NEGATIVE
SARS Coronavirus 2 by RT PCR: NEGATIVE

## 2020-10-12 LAB — LIPASE, BLOOD: Lipase: 20 U/L (ref 11–51)

## 2020-10-12 LAB — OCCULT BLOOD X 1 CARD TO LAB, STOOL: Fecal Occult Bld: POSITIVE — AB

## 2020-10-12 MED ORDER — PANTOPRAZOLE SODIUM 40 MG PO TBEC
40.0000 mg | DELAYED_RELEASE_TABLET | Freq: Every day | ORAL | Status: DC
  Administered 2020-10-13 – 2020-10-16 (×4): 40 mg via ORAL
  Filled 2020-10-12 (×4): qty 1

## 2020-10-12 MED ORDER — MORPHINE SULFATE (PF) 4 MG/ML IV SOLN
4.0000 mg | Freq: Once | INTRAVENOUS | Status: AC
  Administered 2020-10-12: 4 mg via INTRAVENOUS
  Filled 2020-10-12: qty 1

## 2020-10-12 MED ORDER — AMLODIPINE BESYLATE 10 MG PO TABS
10.0000 mg | ORAL_TABLET | Freq: Every day | ORAL | Status: DC
  Administered 2020-10-13: 10 mg via ORAL
  Filled 2020-10-12: qty 1

## 2020-10-12 MED ORDER — LISINOPRIL 20 MG PO TABS
20.0000 mg | ORAL_TABLET | Freq: Every day | ORAL | Status: DC
  Administered 2020-10-13: 20 mg via ORAL
  Filled 2020-10-12: qty 1

## 2020-10-12 MED ORDER — ACETAMINOPHEN 325 MG PO TABS
650.0000 mg | ORAL_TABLET | Freq: Four times a day (QID) | ORAL | Status: DC | PRN
  Administered 2020-10-13: 650 mg via ORAL
  Filled 2020-10-12: qty 2

## 2020-10-12 MED ORDER — SODIUM CHLORIDE 0.9 % IV SOLN: Freq: Once | INTRAVENOUS | Status: AC

## 2020-10-12 MED ORDER — HYDRALAZINE HCL 50 MG PO TABS
100.0000 mg | ORAL_TABLET | Freq: Three times a day (TID) | ORAL | Status: DC
  Administered 2020-10-12 – 2020-10-13 (×2): 100 mg via ORAL
  Filled 2020-10-12 (×3): qty 2

## 2020-10-12 MED ORDER — ONDANSETRON HCL 4 MG/2ML IJ SOLN: 4.0000 mg | Freq: Four times a day (QID) | INTRAMUSCULAR | Status: DC | PRN

## 2020-10-12 MED ORDER — PIPERACILLIN-TAZOBACTAM 3.375 G IVPB
3.3750 g | Freq: Three times a day (TID) | INTRAVENOUS | Status: DC
  Administered 2020-10-12 – 2020-10-16 (×11): 3.375 g via INTRAVENOUS
  Filled 2020-10-12 (×11): qty 50

## 2020-10-12 MED ORDER — HYDROMORPHONE HCL 1 MG/ML IJ SOLN
1.0000 mg | INTRAMUSCULAR | Status: DC | PRN
  Administered 2020-10-12 – 2020-10-13 (×3): 1 mg via INTRAVENOUS
  Filled 2020-10-12 (×3): qty 1

## 2020-10-12 MED ORDER — PRAVASTATIN SODIUM 20 MG PO TABS
40.0000 mg | ORAL_TABLET | Freq: Every day | ORAL | Status: DC
  Administered 2020-10-13 – 2020-10-15 (×3): 40 mg via ORAL
  Filled 2020-10-12 (×3): qty 2

## 2020-10-12 MED ORDER — ASPIRIN EC 325 MG PO TBEC
325.0000 mg | DELAYED_RELEASE_TABLET | Freq: Every day | ORAL | Status: DC
  Administered 2020-10-13: 325 mg via ORAL
  Filled 2020-10-12: qty 1

## 2020-10-12 MED ORDER — ONDANSETRON HCL 4 MG PO TABS
4.0000 mg | ORAL_TABLET | Freq: Four times a day (QID) | ORAL | Status: DC | PRN
  Administered 2020-10-15: 4 mg via ORAL
  Filled 2020-10-12: qty 1

## 2020-10-12 MED ORDER — LIRAGLUTIDE 18 MG/3ML ~~LOC~~ SOPN: 1.2000 mg | PEN_INJECTOR | Freq: Every day | SUBCUTANEOUS | Status: DC

## 2020-10-12 MED ORDER — LEVOTHYROXINE SODIUM 75 MCG PO TABS
75.0000 ug | ORAL_TABLET | Freq: Every day | ORAL | Status: DC
  Administered 2020-10-13 – 2020-10-16 (×4): 75 ug via ORAL
  Filled 2020-10-12 (×4): qty 1

## 2020-10-12 MED ORDER — SODIUM CHLORIDE 0.9% FLUSH
3.0000 mL | Freq: Once | INTRAVENOUS | Status: AC
  Administered 2020-10-12: 3 mL via INTRAVENOUS
  Filled 2020-10-12: qty 3

## 2020-10-12 MED ORDER — ACETAMINOPHEN 650 MG RE SUPP: 650.0000 mg | Freq: Four times a day (QID) | RECTAL | Status: DC | PRN

## 2020-10-12 MED ORDER — LACTATED RINGERS IV SOLN: INTRAVENOUS | Status: DC

## 2020-10-12 NOTE — ED Notes (Signed)
Report called to care link 

## 2020-10-12 NOTE — ED Provider Notes (Addendum)
[facility] EMERGENCY DEPT Provider Note   CSN: [id number] Arrival date & time: 10/12/20  1250     History Chief Complaint  Patient presents with   Abdominal Pain    Heather Dawson is a 65 y.o. female.  HPI Patient reports that she developed diarrhea yesterday evening.  She had been feeling well earlier in the day but has spent a lot of time working pretty aggressively outside and felt that she got overheated.  She went into the house to rest and then a little bit later started getting some diarrhea.  It became very frequent and she started getting abdominal cramping as well.  During the night it became bloody.  She reports that she was passing multiple bloody stools that were bright red but also some darker clots.  Patient reports her crampy abdominal pain is worsened overnight and now is quite severe in her lower and left abdomen.  She has not developed any vomiting or fever.  Patient did not have much to eat yesterday.  She had a couple snacks at home.  The day before she had shared a meal out with her husband and he is not sick.  Patient reports he does have history of both C. difficile as well as a very severe case of E. coli in 2006.  She reports that she was at that time moribund at 1 point and lost 1 kidney.  Ultimately she did recover.  Patient denies any recent antibiotics    Past Medical History:  Diagnosis Date   Abdominal pain, epigastric 12/20/2006   ALLERGIC RHINITIS 12/25/2006   Allergy    ANXIETY 10/16/2006   no per pt   Arthritis    right shoulder   BACK PAIN 06/15/2008   Blood transfusion without reported diagnosis    BRADYCARDIA 05/04/2008   C. difficile diarrhea    Carpal tunnel syndrome 10/16/2006   Cellulitis and abscess of other specified site 06/15/2008   CHEST PAIN 09/08/2009   Chronic low back pain    CIRRHOSIS 10/16/2006   CKD (chronic kidney disease)    2008- had Ecoli and caused renal failure, no problems since then   CKD (chronic kidney  disease) stage 3, GFR 30-59 ml/min ([place]) 10/16/2006   Qualifier: Diagnosis of  By: Jenny Reichmann MD, [place] 01[birth year]/5374   Complication of anesthesia    awake during 2 surgeries   CONTUSION, LOWER LEG 09/15/2008   DEPRESSION 10/16/2006   no per pt   Diabetes mellitus without complication ([place])    Esophageal reflux 12/25/2006   HYPERLIPIDEMIA 12/25/2006   Hyperlipidemia    HYPERTENSION 08/15/2006   Hypertension    HYPOTHYROIDISM 08/15/2006   Kidney failure, acute ([place]) 2009   as a result of severe E Coli infection   Left-sided carotid artery disease ([place])    MIGRAINE HEADACHE 10/16/2006   Obesity 10/16/2006   OVARIAN CYST 12/20/2006   Psoriasis 01/03/2011   SHINGLES, HX OF 12/20/2006   Stroke ([place])    3 strokes 2008-no deficits, only on ASA   TRANSIENT ISCHEMIC ATTACKS, HX OF 12/20/2006   VERTIGO 09/15/2007   VITAMIN B12 DEFICIENCY 01/09/2007    Patient Active Problem List   Diagnosis Date Noted   Aortic atherosclerosis ([place]) 08/12/2020   Left-sided face pain 03/27/2020   Left groin pain 09/29/2019   Fatigue 09/29/2019   Syncope 08/04/2018   Mild aortic stenosis 05/06/2018   Trigger finger, left 04/30/2018   Insomnia 04/30/2018   Thoracic back pain  10/24/2017   Left shoulder pain 04/23/2017   Dysuria 04/04/2017   Bloating symptom 04/04/2017   Acute pain of left shoulder 02/06/2017   Urinary urgency 11/24/2016   Rash 10/05/2016   Bunion of right foot 09/22/2016   Trigger middle finger of left hand 09/22/2016   Arthritis of hand, degenerative 09/22/2016   Left foot pain 02/22/2016   Pain and swelling of right lower leg 02/22/2016   Neck pain 02/01/2016   Postop carotid endarterectomy surveillance, encounter for 12/01/2015   Cough 04/27/2015   Left carotid artery stenosis 08/25/2014   Left-sided carotid artery disease ([place]) 08/10/2014   Stenosis of right carotid artery 07/27/2014   Chronic low back pain 07/14/2014   Neurogenic claudication ([place]) 04/28/2014    Dizziness and giddiness 11/06/2013   GERD (gastroesophageal reflux disease) 11/06/2013   Influenza 02/21/2013   Paronychia 12/30/2012   Encounter for long-term (current) use of high-risk medication 03/02/2011   Psoriasis 01/03/2011   Encounter for well adult exam with abnormal findings 12/30/2010   CHEST PAIN 09/08/2009   LEG CRAMPS 06/20/2009   Diarrhea 06/20/2009   HEMATOCHEZIA 03/21/2009   GANGLION CYST, TENDON SHEATH 11/18/2008   Abdominal pain in female 08/31/2008   Low back pain 06/15/2008   Transient cerebral ischemia 10/14/2007   ESOPHAGITIS 03/13/2007   B12 deficiency 01/09/2007   Diabetes ([place]) 12/25/2006   Hyperlipidemia 12/25/2006   Allergic rhinitis 12/25/2006   Esophageal reflux 12/25/2006   COMMON MIGRAINE 12/20/2006   OVARIAN CYST 12/20/2006   Other malaise and fatigue 12/20/2006   Abdominal pain, epigastric 12/20/2006   SHINGLES, HX OF 12/20/2006   CHOLERA, D/T VIBRIO CHOLERAE EL TOR 10/16/2006   Morbid obesity ([place]) 10/16/2006   Anxiety state 10/16/2006   Depression 10/16/2006   Carpal tunnel syndrome 10/16/2006   CKD (chronic kidney disease) stage 3, GFR 30-59 ml/min ([place]) 10/16/2006   Hypothyroidism 08/15/2006   Essential hypertension 08/15/2006    Past Surgical History:  Procedure Laterality Date   ABDOMINAL HYSTERECTOMY  02/2006   ANTERIOR CERVICAL DECOMP/DISCECTOMY FUSION  2005   CESAREAN SECTION     CHOLECYSTECTOMY     COLONOSCOPY     ENDARTERECTOMY Left 08/25/2014   Procedure: LEFT CAROTID ENDARTERECTOMY WITH HEMASHIELD PATCH ANGIOPLASTY;  Surgeon: Elam Dutch, MD;  Location: [facility] OR;  Service: Vascular;  Laterality: Left;   NECK SURGERY  2005   ROTATOR CUFF REPAIR Right 02/2014   Dr Veverly Fells     OB History   No obstetric history on file.     Family History  Problem Relation Age of Onset   Stroke Father    Heart disease Father    Diabetes Father    Lupus Mother    Raynaud syndrome Mother    Heart disease Mother    Diabetes Sister     Heart disease Brother    Hypertension Brother    Heart disease Brother    Hypertension Brother    Diabetes Other    Hypertension Other    Colon cancer Neg Hx    Esophageal cancer Neg Hx    Rectal cancer Neg Hx    Stomach cancer Neg Hx     Social History   Tobacco Use   Smoking status: Former   Smokeless tobacco: Never   Tobacco comments:    quit smoking over 30 years ago  Vaping Use   Vaping Use: Never used  Substance Use Topics   Alcohol use: No   Drug use: No    Home Medications Prior  to Admission medications   Medication Sig Start Date End Date Taking? Authorizing Provider  acetaminophen (TYLENOL) 500 MG tablet Take 1,000 mg by mouth every 8 (eight) hours as needed (pain).    [provider]  amLODipine (NORVASC) 10 MG tablet Take 1 tablet (10 mg total) by mouth daily. 05/10/20   Biagio Borg, MD  aspirin 325 MG EC tablet Take 325 mg by mouth daily.    [provider]  Cholecalciferol (VITAMIN D3) 1000 units CAPS Take 2 capsules (2,000 Units total) by mouth daily. 04/27/15   Biagio Borg, MD  clobetasol (TEMOVATE) 0.05 % external solution Apply 1 application topically daily as needed (scalp psoriasis).  12/07/15   [provider]  diclofenac sodium (VOLTAREN) 1 % GEL Apply 4 g topically 4 (four) times daily as needed. 10/31/18   Biagio Borg, MD  hydrALAZINE (APRESOLINE) 100 MG tablet Take 1 tablet (100 mg total) by mouth 3 (three) times daily. 04/12/20   Biagio Borg, MD  ID NOW COVID-19 KIT See admin instructions. 09/03/19   [provider]  Insulin Pen Needle (NOVOFINE PEN NEEDLE) 32G X 6 MM MISC Use as directed once weekly E11.9 08/24/20   Biagio Borg, MD  levocetirizine Harlow Ohms) 5 MG tablet  09/12/17   [provider]  levothyroxine (SYNTHROID) 75 MCG tablet TAKE ONE TABLET BY MOUTH DAILY 04/29/20   Biagio Borg, MD  liraglutide (VICTOZA) 18 MG/3ML SOPN Inject 1.8 mg into the skin daily. 08/18/20   Biagio Borg, MD   liraglutide (VICTOZA) 18 MG/3ML SOPN DIAL AND INJECT UNDER THE SKIN 0.6 MG DAILY FOR 1 WEEK THEN 1.2 MG DAILY THEREAFTER 09/15/20   Biagio Borg, MD  lisinopril (ZESTRIL) 20 MG tablet TAKE ONE TABLET BY MOUTH DAILY 06/24/20   Biagio Borg, MD  lovastatin (MEVACOR) 40 MG tablet Take 2 tablets (80 mg total) by mouth at bedtime. 08/12/20   Biagio Borg, MD  mupirocin cream (BACTROBAN) 2 % Apply 1 application topically 2 (two) times daily.    [provider]  pantoprazole (PROTONIX) 40 MG tablet TAKE ONE TABLET BY MOUTH DAILY 09/22/20   Biagio Borg, MD  SUMAtriptan (IMITREX) 100 MG tablet TAKE ONE TABLET BY MOUTH AT ONSET OF MIGRAINE OR HEADACHE; MAY REPEAT ONE TABLET IN 2 HOURS IF NEEDED. 08/09/20   Biagio Borg, MD  triamcinolone cream (KENALOG) 0.1 % SMARTSIG:1 Application Topical 2-3 Times Daily 06/03/19   [provider]    Allergies    Bactrim [sulfamethoxazole-trimethoprim], Cefuroxime axetil, Ciprofloxacin, Doxycycline, Levaquin [levofloxacin in d5w], Lipitor [atorvastatin], Lyrica [pregabalin], Macrodantin [nitrofurantoin macrocrystal], Phentermine, and Gabapentin  Review of Systems   Review of Systems 10 Systems reviewed and negative except as per HPI Physical Exam Updated Vital Signs BP (!) 157/66   Pulse 81   Temp 99 F (37.2 C) (Oral)   Resp 14   Ht _0  (1.575 m)   Wt 81.6 kg   SpO2 99%   BMI 32.92 kg/m   Physical Exam Constitutional:      Comments: Alert nontoxic.  Patient appears to be in significant pain.  Mental status clear.  No respiratory distress.  HENT:     Head: Normocephalic and atraumatic.     Mouth/Throat:     Pharynx: Oropharynx is clear.  Eyes:     Extraocular Movements: Extraocular movements intact.  Cardiovascular:     Rate and Rhythm: Normal rate and regular rhythm.  Pulmonary:  Effort: Pulmonary effort is normal.     Breath sounds: Normal breath sounds.  Abdominal:     Comments: Abdomen is soft.  Moderate to severe pain in  the left mid quadrant.  Genitourinary:    Comments: Small amount of red blood and mucus tinged stool in the vault.  No melena.  No active bleeding. Musculoskeletal:        General: No swelling or tenderness. Normal range of motion.     Right lower leg: No edema.     Left lower leg: No edema.  Skin:    General: Skin is warm and dry.  Neurological:     General: No focal deficit present.     Mental Status: She is oriented to person, place, and time.  Psychiatric:        Mood and Affect: Mood normal.    ED Results / Procedures / Treatments   Labs (all labs ordered are listed, but only abnormal results are displayed) Labs Reviewed  COMPREHENSIVE METABOLIC PANEL - Abnormal; Notable for the following components:      Result Value   Glucose, Bld 119 (*)    BUN 38 (*)    Creatinine, Ser 2.21 (*)    AST 13 (*)    GFR, Estimated 24 (*)    All other components within normal limits  CBC - Abnormal; Notable for the following components:   WBC 12.2 (*)    All other components within normal limits  OCCULT BLOOD X 1 CARD TO LAB, STOOL - Abnormal; Notable for the following components:   Fecal Occult Bld POSITIVE (*)    All other components within normal limits  RESP PANEL BY RT-PCR (FLU A&B, COVID) [id number]  GASTROINTESTINAL PANEL BY PCR, STOOL (REPLACES STOOL CULTURE)  C DIFFICILE QUICK SCREEN W PCR REFLEX    LIPASE, BLOOD  URINALYSIS, ROUTINE W REFLEX MICROSCOPIC    EKG EKG Interpretation  Date/Time:  Wednesday October 12 2020 13:03:13 EDT Ventricular Rate:  89 PR Interval:  140 QRS Duration: 94 QT Interval:  349 QTC Calculation: 425 R Axis:   74 Text Interpretation: Sinus rhythm no acute ischemic changes no sig change from previous Confirmed by Charlesetta Shanks [phone]) on 10/12/2020 5:28:12 PM  Radiology CT Abdomen Pelvis Wo Contrast  Result Date: 10/12/2020 CLINICAL DATA:  Per triage nurse: Pt from Home states that she has been experiencing abdominal pain and diarrhea since 1600  yesterday. Pt states she has had several bout of diarrhea and estimates greater than 40 x. Pt denis any other pain than .*comment was truncated*Diverticulitis suspected EXAM: CT ABDOMEN AND PELVIS WITHOUT CONTRAST TECHNIQUE: Multidetector CT imaging of the abdomen and pelvis was performed following the standard protocol without IV contrast. COMPARISON:  None. FINDINGS: Lower chest: Lung bases are clear. Hepatobiliary: No focal hepatic lesion. Postcholecystectomy. No biliary dilatation. Pancreas: Pancreas is normal. No ductal dilatation. No pancreatic inflammation. Spleen: Normal spleen Adrenals/urinary tract: Adrenal glands normal. RIGHT kidney atrophic. No obstructive uropathy. Bladder normal. Stomach/Bowel: Stomach, duodenum small-bowel normal. Terminal ileum is normal. The ascending and transverse colon normal. There is pericolonic inflammation surrounding the descending colon. Small volume fluid collects in the LEFT pericolic gutter (image 06/3). There is no diverticular disease through this segment of inflamed colon. The segment inflamed colon involves approximately 20 cm from the splenic flexure to the distal descending colon (coronal image 51/5) for example. No perforation or abscess. Sigmoid colon is normal.  Rectum normal. Vascular/Lymphatic: Abdominal aorta normal caliber with intimal calcification. There is moderate calcification  of the superior mesenteric artery. No ostial occlusion identified on noncontrast exam. Reproductive: Post hysterectomy.  Adnexa unremarkable Other: No free fluid. Musculoskeletal: No aggressive osseous lesion. IMPRESSION: 1. Segmental colitis of the descending colon. Differential includes infectious colitis, inflammatory bowel disease, drug induced colitis, or ischemic colitis. No perforation or abscess. No diverticulosis. 2.  Aortic Atherosclerosis (ICD10-I70.0). Electronically Signed   By: Suzy Bouchard M.D.   On: 10/12/2020 16:00    Procedures Procedures   Medications  Ordered in ED Medications  sodium chloride flush (NS) 0.9 % injection 3 mL (3 mLs Intravenous Given 10/12/20 1521)  morphine 4 MG/ML injection 4 mg (4 mg Intravenous Given 10/12/20 1540)  0.9 %  sodium chloride infusion ( Intravenous New Bag/Given 10/12/20 1539)    ED Course  I have reviewed the triage vital signs and the nursing notes.  Pertinent labs & imaging results that were available during my care of the patient were reviewed by me and considered in my medical decision making (see chart for details).    MDM Rules/Calculators/A&P                           Patient presents with symptoms outlined above.  She is having significant amount of pain.  Patient is requiring pain control with morphine.  She is also had GI bleeding including bright red blood and clot.  Rectal exam does not show melena.  Patient does have some red blood in the vault but no active bleeding and no melena.  At this time CT scan shows colitis.  Patient does have history of C. difficile but does not appear to have immediate risk factors without recent hospitalization or antibiotic use.  This time will continue to treat for pain and hydrate.  Patient will be providing stool specimen for C. difficile.  Will await antibiotic initiation until C. difficile results return.  Patient is requiring IV pain medicine control and hydration at this time plan for admission for GI bleed.  Dr. Eliseo Squires accepts for admission. Final Clinical Impression(s) / ED Diagnoses Final diagnoses:  Colitis  Gastrointestinal hemorrhage, unspecified gastrointestinal hemorrhage type  Left lower quadrant abdominal pain    Rx / DC Orders ED Discharge Orders     None        Charlesetta Shanks, MD 10/12/20 1731    Charlesetta Shanks, MD 10/12/20 [id number]    Charlesetta Shanks, MD 10/12/20 1747

## 2020-10-12 NOTE — ED Triage Notes (Signed)
Pt from Home states that she has been experiencing abdominal pain and diarrhea since 1600 yesterday.  Pt states she has had several bout of diarrhea and estimates greater than 40 x. Pt denis any other pain than generalized abdominal pain.  Pt report several bright bloody stools.

## 2020-10-12 NOTE — ED Notes (Signed)
Patient transported to CT 

## 2020-10-12 NOTE — H&P (Signed)
History and Physical    Heather Dawson NWG:[id number] DOB: 04-03-1955 DOA: 10/12/2020  PCP: Biagio Borg, MD   Heather Dawson coming from: Home via [place] [facility]  Chief Complaint: Abdominal pain, blood from rectum  HPI: Heather Dawson is a 65 y.o. female with medical history significant for HTN, DMT2, CKD 4, hx of TIA/CVA, hypothyroidism who presented to [facility] ER after having red blood per rectum earlier today.  Heather Dawson reports that Heather Dawson developed lower abdominal pain and starting diarrhea which became bright red blood and later became bloody with clots.  Heather Dawson continued to have crampy abdominal pain which worsened since last night when it first started.  Heather Dawson reports Heather Dawson was doing some housework and had been fairly active before the diarrhea and bleeding from the rectum started.  Heather Dawson also developed his abdominal cramps last night that were mild but has progressively worsened over the course of the day and become severe which Heather Dawson rates as a 9 out of 10.  Heather Dawson does not take any medications for the diarrhea at home.  Heather Dawson states Heather Dawson has had decreased appetite over the course of the day.  Heather Dawson did eat at home with her husband and he has not been sick.  Heather Dawson did have a history of C. difficile and E. coli infection in 2006.  Heather Dawson reports Heather Dawson has not had any fever.  Heather Dawson denies any tobacco, alcohol, illicit drug use.  Heather Dawson states Heather Dawson has been compliant with her medications.  ED Course: Heather Dawson was hemodynamically stable in the emergency room.  CT scan shows left sided colitis.  Heather Dawson was Hemoccult positive in the emergency room with a small amount of bright red blood with a blood clot as well.  ER physician discussed with gastroenterology who stated they will see Heather Dawson in the morning.  WBC 12,200 hemoglobin 13 hematocrit 38.7 platelets 252,000 sodium 139 potassium 4.2 chloride 106 bicarb 22 creatinine 2.21 BUN 38 glucose 119 lipase 20 albumin 4.4 COVID-19 swab negative.  Influenza A and B-.  Hemoccult positive.  Hospitalist  service has admitted Heather Dawson to the [facility] floor for further evaluation and work-up  Review of Systems:  General: Denies fever, chills, weight loss, night sweats.  Denies dizziness.  Denies change in appetite HENT: Denies head trauma, headache, denies change in hearing, tinnitus.  Denies nasal congestion. Denies sore throat, Denies difficulty swallowing Eyes: Denies blurry vision, pain in eye, drainage.  Denies discoloration of eyes. Neck: Denies pain.  Denies swelling.  Denies pain with movement. Cardiovascular: Denies chest pain, palpitations.  Denies edema.  Denies orthopnea Respiratory: Denies shortness of breath, cough.  Denies wheezing.  Denies sputum production Gastrointestinal: Reports lower abdominal pain.  Denies nausea, vomiting, diarrhea.  Denies melena.  Denies hematemesis. Musculoskeletal: Denies limitation of movement.  Denies deformity or swelling.  Denies pain.  Denies arthralgias or myalgias. Genitourinary: Denies pelvic pain.  Denies urinary frequency or hesitancy.  Denies dysuria.  Skin: Denies rash.  Denies petechiae, purpura, ecchymosis. Neurological: Denies syncope.  Denies seizure activity.  Denies paresthesia.  Denies slurred speech, drooping face.  Denies visual change. Psychiatric: Denies depression, anxiety. Denies hallucinations.  Past Medical History:  Diagnosis Date   Abdominal pain, epigastric 12/20/2006   ALLERGIC RHINITIS 12/25/2006   Allergy    ANXIETY 10/16/2006   no per pt   Arthritis    right shoulder   BACK PAIN 06/15/2008   Blood transfusion without reported diagnosis    BRADYCARDIA 05/04/2008   C. difficile diarrhea    Carpal  tunnel syndrome 10/16/2006   Cellulitis and abscess of other specified site 06/15/2008   CHEST PAIN 09/08/2009   Chronic low back pain    CIRRHOSIS 10/16/2006   CKD (chronic kidney disease)    2008- had Ecoli and caused renal failure, no problems since then   CKD (chronic kidney disease) stage 3, GFR 30-59 ml/min ([place])  10/16/2006   Qualifier: Diagnosis of  By: Jenny Reichmann MD, [place] 81/85/[id number]   Complication of anesthesia    awake during 2 surgeries   CONTUSION, LOWER LEG 09/15/2008   DEPRESSION 10/16/2006   no per pt   Diabetes mellitus without complication ([place])    Esophageal reflux 12/25/2006   HYPERLIPIDEMIA 12/25/2006   Hyperlipidemia    HYPERTENSION 08/15/2006   Hypertension    HYPOTHYROIDISM 08/15/2006   Kidney failure, acute ([place]) 2009   as a result of severe E Coli infection   Left-sided carotid artery disease ([place])    MIGRAINE HEADACHE 10/16/2006   Obesity 10/16/2006   OVARIAN CYST 12/20/2006   Psoriasis 01/03/2011   SHINGLES, HX OF 12/20/2006   Stroke ([place])    3 strokes 2008-no deficits, only on ASA   TRANSIENT ISCHEMIC ATTACKS, HX OF 12/20/2006   VERTIGO 09/15/2007   VITAMIN B12 DEFICIENCY 01/09/2007    Past Surgical History:  Procedure Laterality Date   ABDOMINAL HYSTERECTOMY  02/2006   ANTERIOR CERVICAL DECOMP/DISCECTOMY FUSION  2005   CESAREAN SECTION     CHOLECYSTECTOMY     COLONOSCOPY     ENDARTERECTOMY Left 08/25/2014   Procedure: LEFT CAROTID ENDARTERECTOMY WITH HEMASHIELD PATCH ANGIOPLASTY;  Surgeon: Elam Dutch, MD;  Location: [place];  Service: Vascular;  Laterality: Left;   NECK SURGERY  2005   ROTATOR CUFF REPAIR Right 02/2014   Dr Veverly Fells    Social History  reports that Heather Dawson has quit smoking. Heather Dawson has never used smokeless tobacco. Heather Dawson reports that Heather Dawson does not drink alcohol and does not use drugs.  Allergies  Allergen Reactions   Bactrim [Sulfamethoxazole-Trimethoprim] Nausea And Vomiting   Cefuroxime Axetil Swelling     edema/swelling   Ciprofloxacin Swelling   Doxycycline Nausea Only   Levaquin [Levofloxacin In D5w] Other (See Comments)    GI upset   Lipitor [Atorvastatin] Other (See Comments)    Myalgia    Lyrica [Pregabalin] Other (See Comments)    HA, confusion, swelling   Macrodantin [Nitrofurantoin Macrocrystal] Other (See Comments)    GI  upset, diarrhea and sob   Phentermine Other (See Comments)    All the side effects listed on the paper   Gabapentin Rash    Family History  Problem Relation Age of Onset   Stroke Father    Heart disease Father    Diabetes Father    Lupus Mother    Raynaud syndrome Mother    Heart disease Mother    Diabetes Sister    Heart disease Brother    Hypertension Brother    Heart disease Brother    Hypertension Brother    Diabetes Other    Hypertension Other    Colon cancer Neg Hx    Esophageal cancer Neg Hx    Rectal cancer Neg Hx    Stomach cancer Neg Hx      Prior to Admission medications   Medication Sig Start Date End Date Taking? Authorizing Provider  acetaminophen (TYLENOL) 500 MG tablet Take 1,000 mg by mouth every 8 (eight) hours as needed (pain).   Yes [provider]  amLODipine (NORVASC) 10 MG tablet Take 1 tablet (10 mg total) by mouth daily. 05/10/20  Yes Biagio Borg, MD  Ascorbic Acid (VITAMIN C) 1000 MG tablet Take 1,000 mg by mouth daily.   Yes [provider]  aspirin 325 MG EC tablet Take 325 mg by mouth daily.   Yes [provider]  Cholecalciferol (VITAMIN D3) 1000 units CAPS Take 2 capsules (2,000 Units total) by mouth daily. 04/27/15  Yes Biagio Borg, MD  clobetasol (TEMOVATE) 0.05 % external solution Apply 1 application topically daily as needed (scalp psoriasis).  12/07/15  Yes [provider]  diclofenac sodium (VOLTAREN) 1 % GEL Apply 4 g topically 4 (four) times daily as needed. Heather Dawson taking differently: Apply 4 g topically 4 (four) times daily as needed (pain). 10/31/18  Yes Biagio Borg, MD  estradiol (ESTRACE) 0.1 MG/GM vaginal cream Place 1 Applicatorful vaginally daily as needed (yeast infection). 05/23/20  Yes [provider]  hydrALAZINE (APRESOLINE) 100 MG tablet Take 1 tablet (100 mg total) by mouth 3 (three) times daily. 04/12/20  Yes Biagio Borg, MD  levocetirizine (XYZAL) 5 MG tablet Take 5 mg by  mouth every evening. 09/12/17  Yes [provider]  levothyroxine (SYNTHROID) 75 MCG tablet TAKE ONE TABLET BY MOUTH DAILY Heather Dawson taking differently: Take 75 mcg by mouth daily before breakfast. 04/29/20  Yes Biagio Borg, MD  liraglutide (VICTOZA) 18 MG/3ML SOPN Inject 1.8 mg into the skin daily. Heather Dawson taking differently: Inject 1.2 mg into the skin daily. 08/18/20  Yes Biagio Borg, MD  lisinopril (ZESTRIL) 20 MG tablet TAKE ONE TABLET BY MOUTH DAILY Heather Dawson taking differently: Take 20 mg by mouth daily. 06/24/20  Yes Biagio Borg, MD  lovastatin (MEVACOR) 40 MG tablet Take 2 tablets (80 mg total) by mouth at bedtime. 08/12/20  Yes Biagio Borg, MD  pantoprazole (PROTONIX) 40 MG tablet TAKE ONE TABLET BY MOUTH DAILY Heather Dawson taking differently: Take 40 mg by mouth daily. 09/22/20  Yes Biagio Borg, MD  SUMAtriptan (IMITREX) 100 MG tablet TAKE ONE TABLET BY MOUTH AT ONSET OF MIGRAINE OR HEADACHE; MAY REPEAT ONE TABLET IN 2 HOURS IF NEEDED. Heather Dawson taking differently: Take 100 mg by mouth daily as needed for migraine. TAKE ONE TABLET BY MOUTH AT ONSET OF MIGRAINE OR HEADACHE; MAY REPEAT ONE TABLET IN 2 HOURS IF NEEDED. 08/09/20  Yes Biagio Borg, MD  triamcinolone cream (KENALOG) 0.1 % Apply 1 application topically daily. 06/03/19  Yes [provider]  TURMERIC CURCUMIN PO Take 1 tablet by mouth daily.   Yes [provider]  ID NOW COVID-19 KIT See admin instructions. 09/03/19   [provider]  Insulin Pen Needle (NOVOFINE PEN NEEDLE) 32G X 6 MM MISC Use as directed once weekly E11.9 08/24/20   Biagio Borg, MD  liraglutide (VICTOZA) 18 MG/3ML SOPN DIAL AND INJECT UNDER THE SKIN 0.6 MG DAILY FOR 1 WEEK THEN 1.2 MG DAILY THEREAFTER Heather Dawson not taking: No sig reported 09/15/20   Biagio Borg, MD    Physical Exam: Vitals:   10/12/20 1605 10/12/20 1700 10/12/20 1939 10/12/20 2130  BP: (!) 149/81 (!) 157/66 138/66 (!) 174/82  Pulse: 86 81 79 75  Resp: _0 Temp:   99.6 F (37.6 C) 99.2 F (37.3 C)  TempSrc:   Oral Oral  SpO2: 97% 99% 99% 96%  Weight:      Height:  Constitutional: NAD, calm, comfortable Vitals:   10/12/20 1605 10/12/20 1700 10/12/20 1939 10/12/20 2130  BP: (!) 149/81 (!) 157/66 138/66 (!) 174/82  Pulse: 86 81 79 75  Resp: _0 Temp:   99.6 F (37.6 C) 99.2 F (37.3 C)  TempSrc:   Oral Oral  SpO2: 97% 99% 99% 96%  Weight:      Height:       General: WDWN, Alert and oriented x3.  Eyes: EOMI, PERRL, conjunctivae normal.  Sclera nonicteric HENT:  [place]/AT, external ears normal.  Nares patent without epistasis.  Mucous membranes are moist. Posterior pharynx clear  Neck: Soft, normal range of motion, supple, no masses, no thyromegaly.  Trachea midline Respiratory: clear to auscultation bilaterally, no wheezing, no crackles. Normal respiratory effort. No accessory muscle use.  Cardiovascular: Regular rate and rhythm, no murmurs / rubs / gallops. No extremity edema. 2+ pedal pulses Abdomen: Soft, Left lower abdominal tenderness, nondistended, no rebound or guarding.  No masses palpated. Bowel sounds normoactive Musculoskeletal: FROM. cyanosis. No joint deformity upper and lower extremities. Normal muscle tone.  Skin: Warm, dry, intact no rashes, lesions, ulcers. No induration Neurologic: CN 2-12 grossly intact.  Normal speech.  Sensation intact to touch. Grip Strength 5/5 bilaterally   Psychiatric: Normal judgment and insight.  Normal mood.    Labs on Admission: I have personally reviewed following labs and imaging studies  CBC: Recent Labs  Lab 10/12/20 1335  WBC 12.2*  HGB 13.0  HCT 38.7  MCV 94.4  PLT 578    Basic Metabolic Panel: Recent Labs  Lab 10/12/20 1335  NA 139  K 4.2  CL 106  CO2 22  GLUCOSE 119*  BUN 38*  CREATININE 2.21*  CALCIUM 9.5    GFR: Estimated Creatinine Clearance: 25.8 mL/min (A) (by C-G formula based on SCr of 2.21 mg/dL (H)).  Liver Function Tests: Recent  Labs  Lab 10/12/20 1335  AST 13*  ALT 8  ALKPHOS 42  BILITOT 0.6  PROT 6.6  ALBUMIN 4.4    Urine analysis:    Component Value Date/Time   COLORURINE YELLOW 09/10/2020 2107   APPEARANCEUR CLEAR 09/10/2020 2107   LABSPEC 1.014 09/10/2020 2107   PHURINE 6.0 09/10/2020 2107   GLUCOSEU NEGATIVE 09/10/2020 2107   GLUCOSEU NEGATIVE 05/10/2020 0917   HGBUR NEGATIVE 09/10/2020 2107   HGBUR negative 01/10/2009 1507   BILIRUBINUR NEGATIVE 09/10/2020 2107   BILIRUBINUR neg 04/12/2020 [place] 09/10/2020 2107   PROTEINUR NEGATIVE 09/10/2020 2107   UROBILINOGEN 0.2 05/10/2020 0917   NITRITE NEGATIVE 09/10/2020 2107   LEUKOCYTESUR NEGATIVE 09/10/2020 2107    Radiological Exams on Admission: CT Abdomen Pelvis Wo Contrast  Result Date: 10/12/2020 CLINICAL DATA:  Per triage nurse: Pt from Home states that Heather Dawson has been experiencing abdominal pain and diarrhea since 1600 yesterday. Pt states Heather Dawson has had several bout of diarrhea and estimates greater than 40 x. Pt denis any other pain than .*comment was truncated*Diverticulitis suspected EXAM: CT ABDOMEN AND PELVIS WITHOUT CONTRAST TECHNIQUE: Multidetector CT imaging of the abdomen and pelvis was performed following the standard protocol without IV contrast. COMPARISON:  None. FINDINGS: Lower chest: Lung bases are clear. Hepatobiliary: No focal hepatic lesion. Postcholecystectomy. No biliary dilatation. Pancreas: Pancreas is normal. No ductal dilatation. No pancreatic inflammation. Spleen: Normal spleen Adrenals/urinary tract: Adrenal glands normal. RIGHT kidney atrophic. No obstructive uropathy. Bladder normal. Stomach/Bowel: Stomach, duodenum small-bowel normal. Terminal ileum is normal. The ascending and transverse colon normal. There is  pericolonic inflammation surrounding the descending colon. Small volume fluid collects in the LEFT pericolic gutter (image 57/9). There is no diverticular disease through this segment of inflamed  colon. The segment inflamed colon involves approximately 20 cm from the splenic flexure to the distal descending colon (coronal image 51/5) for example. No perforation or abscess. Sigmoid colon is normal.  Rectum normal. Vascular/Lymphatic: Abdominal aorta normal caliber with intimal calcification. There is moderate calcification of the superior mesenteric artery. No ostial occlusion identified on noncontrast exam. Reproductive: Post hysterectomy.  Adnexa unremarkable Other: No free fluid. Musculoskeletal: No aggressive osseous lesion. IMPRESSION: 1. Segmental colitis of the descending colon. Differential includes infectious colitis, inflammatory bowel disease, drug induced colitis, or ischemic colitis. No perforation or abscess. No diverticulosis. 2.  Aortic Atherosclerosis (ICD10-I70.0). Electronically Signed   By: Suzy Bouchard M.D.   On: 10/12/2020 16:00    EKG: Independently reviewed.  EKG shows normal sinus rhythm with no acute ST elevation or depression.  QTc 425  Assessment/Plan Principal Problem:   Colitis Heather Dawson is admitted to surgical floor. Started on Zosyn for antibiotic coverage with colitis. Stool GI panel and C. difficile have been ordered and are pending.  If are positive will treat as indicated. Gastroenterology consulted and will see Heather Dawson in the morning with acute GI bleeding. IV fluid hydration LR Pain control with Dilaudid.  Antiemetic provided as needed  Active Problems:   Gastrointestinal hemorrhage Heather Dawson with bright red blood with clots from rectum.  GI is consulted and will evaluate Heather Dawson in the morning    Essential hypertension Continue home and evidence of medication with Norvasc, lisinopril and hydralazine.    Left lower quadrant abdominal pain Dilaudid for pain control as needed    Diabetes mellitus type 2 in obese Continue home dose of Victoza.  Blood sugar be rechecked in the morning   CKD (chronic kidney disease) stage 4, GFR 15-29 ml/min   Stable.  Recheck electrolytes and renal function with labs in morning    Hypothyroidism Continue home dose of levothyroxine.    DVT prophylaxis: SCDs for DVT prophylaxis. No anticoagulation with acute GI bleeding Code Status:   Full code Family Communication:  Diagnosis and plan discussed with Heather Dawson.  Heather Dawson verbalized understanding agrees with plan.  Further recommendations to follow as clinical indicated Disposition Plan:   Heather Dawson is from:  Home  Anticipated DC to:  Home  Anticipated DC date:  Anticipate 2 midnight or more stay   Consults called:  Gastroenterology Admission status:  Inpatient   Yevonne Aline Cieanna Stormes MD [facility]  How to contact the [facility] Attending or Consulting provider [place] or covering provider during after hours [address], for this Heather Dawson?   Check the care team in [facility] and look for a) attending/consulting [facility] provider listed and b) the [facility] team listed Log into [email or web address] and use [facility]'s universal password to access. If you do not have the password, please contact the hospital operator. Locate the [facility] provider you are looking for under [facility] and page to a number that you can be directly reached. If you still have difficulty reaching the provider, please page the [facility] (Director on Call) for the Hospitalists listed on amion for assistance.  10/12/2020, 9:45 PM

## 2020-10-13 DIAGNOSIS — K529 Noninfective gastroenteritis and colitis, unspecified: Secondary | ICD-10-CM

## 2020-10-13 DIAGNOSIS — I10 Essential (primary) hypertension: Secondary | ICD-10-CM | POA: Diagnosis not present

## 2020-10-13 DIAGNOSIS — N184 Chronic kidney disease, stage 4 (severe): Secondary | ICD-10-CM

## 2020-10-13 DIAGNOSIS — K5790 Diverticulosis of intestine, part unspecified, without perforation or abscess without bleeding: Secondary | ICD-10-CM

## 2020-10-13 DIAGNOSIS — K625 Hemorrhage of anus and rectum: Secondary | ICD-10-CM

## 2020-10-13 DIAGNOSIS — K922 Gastrointestinal hemorrhage, unspecified: Secondary | ICD-10-CM | POA: Diagnosis not present

## 2020-10-13 DIAGNOSIS — R933 Abnormal findings on diagnostic imaging of other parts of digestive tract: Secondary | ICD-10-CM

## 2020-10-13 DIAGNOSIS — R1084 Generalized abdominal pain: Secondary | ICD-10-CM

## 2020-10-13 LAB — CBC
HCT: 34.8 % — ABNORMAL LOW (ref 36.0–46.0)
Hemoglobin: 11.2 g/dL — ABNORMAL LOW (ref 12.0–15.0)
MCH: 32.3 pg (ref 26.0–34.0)
MCHC: 32.2 g/dL (ref 30.0–36.0)
MCV: 100.3 fL — ABNORMAL HIGH (ref 80.0–100.0)
Platelets: 199 10*3/uL (ref 150–400)
RBC: 3.47 MIL/uL — ABNORMAL LOW (ref 3.87–5.11)
RDW: 12.1 % (ref 11.5–15.5)
WBC: 10.1 10*3/uL (ref 4.0–10.5)
nRBC: 0 % (ref 0.0–0.2)

## 2020-10-13 LAB — COMPREHENSIVE METABOLIC PANEL
ALT: 13 U/L (ref 0–44)
AST: 12 U/L — ABNORMAL LOW (ref 15–41)
Albumin: 3.4 g/dL — ABNORMAL LOW (ref 3.5–5.0)
Alkaline Phosphatase: 39 U/L (ref 38–126)
Anion gap: 7 (ref 5–15)
BUN: 34 mg/dL — ABNORMAL HIGH (ref 8–23)
CO2: 23 mmol/L (ref 22–32)
Calcium: 8.5 mg/dL — ABNORMAL LOW (ref 8.9–10.3)
Chloride: 108 mmol/L (ref 98–111)
Creatinine, Ser: 2.11 mg/dL — ABNORMAL HIGH (ref 0.44–1.00)
GFR, Estimated: 26 mL/min — ABNORMAL LOW (ref >60)
Glucose, Bld: 98 mg/dL (ref 70–99)
Potassium: 4.4 mmol/L (ref 3.5–5.1)
Sodium: 138 mmol/L (ref 135–145)
Total Bilirubin: 0.8 mg/dL (ref 0.3–1.2)
Total Protein: 5.5 g/dL — ABNORMAL LOW (ref 6.5–8.1)

## 2020-10-13 LAB — HIV ANTIBODY (ROUTINE TESTING W REFLEX): HIV Screen 4th Generation wRfx: NONREACTIVE

## 2020-10-13 LAB — HEMOGLOBIN AND HEMATOCRIT, BLOOD
HCT: 36.4 % (ref 36.0–46.0)
HCT: 31.8 % — ABNORMAL LOW (ref 36.0–46.0)
Hemoglobin: 11.5 g/dL — ABNORMAL LOW (ref 12.0–15.0)
Hemoglobin: 10.4 g/dL — ABNORMAL LOW (ref 12.0–15.0)

## 2020-10-13 NOTE — Consult Note (Signed)
Referring Provider:   Dr. Starla Link Primary Care Physician:  Biagio Borg, MD Primary Gastroenterologist:  Dr. Henrene Pastor  Reason for Consultation:  GI bleed, CT scan with colitis  HPI: Heather Dawson is a 65 y.o. female with past medical history significant for hypertension, type 2 diabetes mellitus, chronic kidney disease stage IV, history of TIA/CVA, hypothyroidism who presented to the ER at [facility] location with complaints of abdominal pain and passing blood per rectum.  She tells me that on Tuesday she mowed her lawn and cleaned her vehicle outside.  Shortly after that she started having cramping abdominal pain and that was followed by sudden onset of diarrhea.  She had a few episodes of diarrhea and then began seeing blood.  Since then she has had several episodes where she is passed only blood per rectum and continues to do so.  She continues to have diffuse abdominal pain, but more so on the left side.  She does have a history of C. difficile remotely.  She looks well.  Says that prior to this she was having normal bowel movements, no diarrhea.  Hgb was 12.4 grams now down to 11.5 grams.  WBC count 12.2 on admission but quickly normalized.  Has been started on Zosyn.  CT scan of the abdomen and pelvis without contrast showed the following:  IMPRESSION: 1. Segmental colitis of the descending colon. Differential includes infectious colitis, inflammatory bowel disease, drug induced colitis, or ischemic colitis. No perforation or abscess. No diverticulosis. 2.  Aortic Atherosclerosis (ICD10-I70.0).  Colonoscopy 11/2018 showed the following:  - One 4 mm polyp in the sigmoid colon, removed with a cold snare. Resected and retrieved. - Diverticulosis in the sigmoid colon. - The examination was otherwise normal on direct and retroflexion views.  Pathology showed a tubular adenoma.  Past Medical History:  Diagnosis Date   Abdominal pain, epigastric 12/20/2006   ALLERGIC RHINITIS 12/25/2006    Allergy    ANXIETY 10/16/2006   no per pt   Arthritis    right shoulder   BACK PAIN 06/15/2008   Blood transfusion without reported diagnosis    BRADYCARDIA 05/04/2008   C. difficile diarrhea    Carpal tunnel syndrome 10/16/2006   Cellulitis and abscess of other specified site 06/15/2008   CHEST PAIN 09/08/2009   Chronic low back pain    CIRRHOSIS 10/16/2006   CKD (chronic kidney disease)    2008- had Ecoli and caused renal failure, no problems since then   CKD (chronic kidney disease) stage 3, GFR 30-59 ml/min ([place]) 10/16/2006   Qualifier: Diagnosis of  By: Jenny Reichmann MD, [place] 10/01/8869   Complication of anesthesia    awake during 2 surgeries   CONTUSION, LOWER LEG 09/15/2008   DEPRESSION 10/16/2006   no per pt   Diabetes mellitus without complication ([place])    Esophageal reflux 12/25/2006   HYPERLIPIDEMIA 12/25/2006   Hyperlipidemia    HYPERTENSION 08/15/2006   Hypertension    HYPOTHYROIDISM 08/15/2006   Kidney failure, acute ([place]) 2009   as a result of severe E Coli infection   Left-sided carotid artery disease ([place])    MIGRAINE HEADACHE 10/16/2006   Obesity 10/16/2006   OVARIAN CYST 12/20/2006   Psoriasis 01/03/2011   SHINGLES, HX OF 12/20/2006   Stroke ([place])    3 strokes 2008-no deficits, only on ASA   TRANSIENT ISCHEMIC ATTACKS, HX OF 12/20/2006   VERTIGO 09/15/2007   VITAMIN B12 DEFICIENCY 01/09/2007    Past Surgical  History:  Procedure Laterality Date   ABDOMINAL HYSTERECTOMY  02/2006   ANTERIOR CERVICAL DECOMP/DISCECTOMY FUSION  2005   CESAREAN SECTION     CHOLECYSTECTOMY     COLONOSCOPY     ENDARTERECTOMY Left 08/25/2014   Procedure: LEFT CAROTID ENDARTERECTOMY WITH HEMASHIELD PATCH ANGIOPLASTY;  Surgeon: Elam Dutch, MD;  Location: [place];  Service: Vascular;  Laterality: Left;   NECK SURGERY  2005   ROTATOR CUFF REPAIR Right 02/2014   Dr Veverly Fells    Prior to Admission medications   Medication Sig Start Date End Date Taking? Authorizing Provider   acetaminophen (TYLENOL) 500 MG tablet Take 1,000 mg by mouth every 8 (eight) hours as needed (pain).   Yes [provider]  amLODipine (NORVASC) 10 MG tablet Take 1 tablet (10 mg total) by mouth daily. 05/10/20  Yes Biagio Borg, MD  Ascorbic Acid (VITAMIN C) 1000 MG tablet Take 1,000 mg by mouth daily.   Yes [provider]  aspirin 325 MG EC tablet Take 325 mg by mouth daily.   Yes [provider]  Cholecalciferol (VITAMIN D3) 1000 units CAPS Take 2 capsules (2,000 Units total) by mouth daily. 04/27/15  Yes Biagio Borg, MD  clobetasol (TEMOVATE) 0.05 % external solution Apply 1 application topically daily as needed (scalp psoriasis).  12/07/15  Yes [provider]  diclofenac sodium (VOLTAREN) 1 % GEL Apply 4 g topically 4 (four) times daily as needed. Patient taking differently: Apply 4 g topically 4 (four) times daily as needed (pain). 10/31/18  Yes Biagio Borg, MD  estradiol (ESTRACE) 0.1 MG/GM vaginal cream Place 1 Applicatorful vaginally daily as needed (yeast infection). 05/23/20  Yes [provider]  hydrALAZINE (APRESOLINE) 100 MG tablet Take 1 tablet (100 mg total) by mouth 3 (three) times daily. 04/12/20  Yes Biagio Borg, MD  levocetirizine (XYZAL) 5 MG tablet Take 5 mg by mouth every evening. 09/12/17  Yes [provider]  levothyroxine (SYNTHROID) 75 MCG tablet TAKE ONE TABLET BY MOUTH DAILY Patient taking differently: Take 75 mcg by mouth daily before breakfast. 04/29/20  Yes Biagio Borg, MD  liraglutide (VICTOZA) 18 MG/3ML SOPN Inject 1.8 mg into the skin daily. Patient taking differently: Inject 1.2 mg into the skin daily. 08/18/20  Yes Biagio Borg, MD  lisinopril (ZESTRIL) 20 MG tablet TAKE ONE TABLET BY MOUTH DAILY Patient taking differently: Take 20 mg by mouth daily. 06/24/20  Yes Biagio Borg, MD  lovastatin (MEVACOR) 40 MG tablet Take 2 tablets (80 mg total) by mouth at bedtime. 08/12/20  Yes Biagio Borg, MD   pantoprazole (PROTONIX) 40 MG tablet TAKE ONE TABLET BY MOUTH DAILY Patient taking differently: Take 40 mg by mouth daily. 09/22/20  Yes Biagio Borg, MD  SUMAtriptan (IMITREX) 100 MG tablet TAKE ONE TABLET BY MOUTH AT ONSET OF MIGRAINE OR HEADACHE; MAY REPEAT ONE TABLET IN 2 HOURS IF NEEDED. Patient taking differently: Take 100 mg by mouth daily as needed for migraine. TAKE ONE TABLET BY MOUTH AT ONSET OF MIGRAINE OR HEADACHE; MAY REPEAT ONE TABLET IN 2 HOURS IF NEEDED. 08/09/20  Yes Biagio Borg, MD  triamcinolone cream (KENALOG) 0.1 % Apply 1 application topically daily. 06/03/19  Yes [provider]  TURMERIC CURCUMIN PO Take 1 tablet by mouth daily.   Yes [provider]  ID NOW COVID-19 KIT See admin instructions. 09/03/19   [provider]  Insulin Pen Needle (NOVOFINE PEN NEEDLE) 32G X 6 MM  MISC Use as directed once weekly E11.9 08/24/20   Biagio Borg, MD  liraglutide (VICTOZA) 18 MG/3ML SOPN DIAL AND INJECT UNDER THE SKIN 0.6 MG DAILY FOR 1 WEEK THEN 1.2 MG DAILY THEREAFTER Patient not taking: No sig reported 09/15/20   Biagio Borg, MD    Current Facility-Administered Medications  Medication Dose Route Frequency Provider Last Rate Last Admin   acetaminophen (TYLENOL) tablet 650 mg  650 mg Oral Q6H PRN Chotiner, Yevonne Aline, MD       Or   acetaminophen (TYLENOL) suppository 650 mg  650 mg Rectal Q6H PRN Chotiner, Yevonne Aline, MD       amLODipine (NORVASC) tablet 10 mg  10 mg Oral Daily Chotiner, Yevonne Aline, MD       aspirin EC tablet 325 mg  325 mg Oral Daily Chotiner, Yevonne Aline, MD       hydrALAZINE (APRESOLINE) tablet 100 mg  100 mg Oral TID Chotiner, Yevonne Aline, MD   100 mg at 10/12/20 2257   HYDROmorphone (DILAUDID) injection 1 mg  1 mg Intravenous Q3H PRN Chotiner, Yevonne Aline, MD   1 mg at 10/13/20 1740   lactated ringers infusion   Intravenous Continuous Chotiner, Yevonne Aline, MD 100 mL/hr at 10/13/20 0126 New Bag at 10/13/20 0126   levothyroxine (SYNTHROID)  tablet 75 mcg  75 mcg Oral Q0600 Chotiner, Yevonne Aline, MD   75 mcg at 10/13/20 0630   lisinopril (ZESTRIL) tablet 20 mg  20 mg Oral Daily Chotiner, Yevonne Aline, MD       ondansetron [facility]) tablet 4 mg  4 mg Oral Q6H PRN Chotiner, Yevonne Aline, MD       Or   ondansetron (ZOFRAN) injection 4 mg  4 mg Intravenous Q6H PRN Chotiner, Yevonne Aline, MD       pantoprazole (PROTONIX) EC tablet 40 mg  40 mg Oral Daily Chotiner, Yevonne Aline, MD       piperacillin-tazobactam (ZOSYN) IVPB 3.375 g  3.375 g Intravenous Q8H Chotiner, Yevonne Aline, MD 12.5 mL/hr at 10/13/20 0631 3.375 g at 10/13/20 0631   pravastatin (PRAVACHOL) tablet 40 mg  40 mg Oral q1800 Chotiner, Yevonne Aline, MD        Allergies as of 10/12/2020 - Review Complete 10/12/2020  Allergen Reaction Noted   Bactrim [sulfamethoxazole-trimethoprim] Nausea And Vomiting 10/06/2019   Cefuroxime axetil Swelling 02/21/2007   Ciprofloxacin Swelling 04/18/2012   Doxycycline Nausea Only 04/14/2020   Levaquin [levofloxacin in d5w] Other (See Comments) 04/23/2017   Lipitor [atorvastatin] Other (See Comments) 08/03/2013   Lyrica [pregabalin] Other (See Comments) 02/05/2016   Macrodantin [nitrofurantoin macrocrystal] Other (See Comments) 04/23/2017   Phentermine Other (See Comments) 08/12/2020   Gabapentin Rash 02/22/2016    Family History  Problem Relation Age of Onset   Stroke Father    Heart disease Father    Diabetes Father    Lupus Mother    Raynaud syndrome Mother    Heart disease Mother    Diabetes Sister    Heart disease Brother    Hypertension Brother    Heart disease Brother    Hypertension Brother    Diabetes Other    Hypertension Other    Colon cancer Neg Hx    Esophageal cancer Neg Hx    Rectal cancer Neg Hx    Stomach cancer Neg Hx     Social History   Socioeconomic History   Marital status: Married    Spouse name: Heather Dawson   Number of children: Not on  file   Years of education: Not on file   Highest education level: Not on file   Occupational History   Occupation: retired  Tobacco Use   Smoking status: Former   Smokeless tobacco: Never   Tobacco comments:    quit smoking over 30 years ago  Vaping Use   Vaping Use: Never used  Substance and Sexual Activity   Alcohol use: No   Drug use: No   Sexual activity: Not on file  Other Topics Concern   Not on file  Social History Narrative   Lives with husband   Right Handed   Drinks 2-4 cups coffee   Social Determinants of Health   Financial Resource Strain: Low Risk    Difficulty of Paying Living Expenses: Not hard at all  Food Insecurity: No Food Insecurity   Worried About Charity fundraiser in the Last Year: Never true   [place] in the Last Year: Never true  Transportation Needs: No Transportation Needs   Lack of Transportation (Medical): No   Lack of Transportation (Non-Medical): No  Physical Activity: Sufficiently Active   Days of Exercise per Week: 7 days   Minutes of Exercise per Session: 30 min  Stress: No Stress Concern Present   Feeling of Stress : Not at all  Social Connections: Socially Integrated   Frequency of Communication with Friends and Family: More than three times a week   Frequency of Social Gatherings with Friends and Family: More than three times a week   Attends Religious Services: More than 4 times per year   Active Member of [employer] or Organizations: Yes   Attends Music therapist: More than 4 times per year   Marital Status: Married  Human resources officer Violence: Not on file    Review of Systems: ROS is O/W negative except as mentioned in HPI.  Physical Exam: Vital signs in last 24 hours: Temp:  [98.6 F (37 C)-99.6 F (37.6 C)] 98.6 F (37 C) (08/25 0516) Pulse Rate:  [70-89] 75 (08/25 0516) Resp:  [11-22] 16 (08/25 0516) BP: (106-174)/(49-89) 132/68 (08/25 0516) SpO2:  [94 %-100 %] 94 % (08/25 0516) Weight:  [81.6 kg] 81.6 kg (08/24 1302)   General:  Alert, Well-developed, well-nourished,  pleasant and cooperative in NAD Head:  Normocephalic and atraumatic. Eyes:  Sclera clear, no icterus.  Conjunctiva pink. Ears:  Normal auditory acuity. Mouth:  No deformity or lesions.   Lungs:  Clear throughout to auscultation.  No wheezes, crackles, or rhonchi.  Heart:  Regular rate and rhythm; no murmurs, clicks, rubs, or gallops. Abdomen:  Soft, non-distended.  BS present.  Moderate TTP diffuse but greatest in the LLQ.  Rectal:  Deferred.  Gross blood on exam per EDP.  Msk:  Symmetrical without gross deformities. Pulses:  Normal pulses noted. Extremities:  Without clubbing or edema. Neurologic:  Alert and oriented x 4;  grossly normal neurologically. Skin:  Intact without significant lesions or rashes. Psych:  Alert and cooperative. Normal mood and affect.  Intake/Output from previous day: 08/24 0701 - 08/25 0700 In: 504.6 [I.V.:454.6; IV Piggyback:49.9] Out: 0   Lab Results: Recent Labs    10/12/20 1335 10/12/20 2201 10/13/20 0345  WBC 12.2*  --  10.1  HGB 13.0 12.4 11.2*  HCT 38.7 37.2 34.8*  PLT 252  --  199   BMET Recent Labs    10/12/20 1335 10/13/20 0345  NA 139 138  K 4.2 4.4  CL 106 108  CO2  22 23  GLUCOSE 119* 98  BUN 38* 34*  CREATININE 2.21* 2.11*  CALCIUM 9.5 8.5*   LFT Recent Labs    10/13/20 0345  PROT 5.5*  ALBUMIN 3.4*  AST 12*  ALT 13  ALKPHOS 39  BILITOT 0.8   Studies/Results: CT Abdomen Pelvis Wo Contrast  Result Date: 10/12/2020 CLINICAL DATA:  Per triage nurse: Pt from Home states that she has been experiencing abdominal pain and diarrhea since 1600 yesterday. Pt states she has had several bout of diarrhea and estimates greater than 40 x. Pt denis any other pain than .*comment was truncated*Diverticulitis suspected EXAM: CT ABDOMEN AND PELVIS WITHOUT CONTRAST TECHNIQUE: Multidetector CT imaging of the abdomen and pelvis was performed following the standard protocol without IV contrast. COMPARISON:  None. FINDINGS: Lower chest: Lung  bases are clear. Hepatobiliary: No focal hepatic lesion. Postcholecystectomy. No biliary dilatation. Pancreas: Pancreas is normal. No ductal dilatation. No pancreatic inflammation. Spleen: Normal spleen Adrenals/urinary tract: Adrenal glands normal. RIGHT kidney atrophic. No obstructive uropathy. Bladder normal. Stomach/Bowel: Stomach, duodenum small-bowel normal. Terminal ileum is normal. The ascending and transverse colon normal. There is pericolonic inflammation surrounding the descending colon. Small volume fluid collects in the LEFT pericolic gutter (image 18/4). There is no diverticular disease through this segment of inflamed colon. The segment inflamed colon involves approximately 20 cm from the splenic flexure to the distal descending colon (coronal image 51/5) for example. No perforation or abscess. Sigmoid colon is normal.  Rectum normal. Vascular/Lymphatic: Abdominal aorta normal caliber with intimal calcification. There is moderate calcification of the superior mesenteric artery. No ostial occlusion identified on noncontrast exam. Reproductive: Post hysterectomy.  Adnexa unremarkable Other: No free fluid. Musculoskeletal: No aggressive osseous lesion. IMPRESSION: 1. Segmental colitis of the descending colon. Differential includes infectious colitis, inflammatory bowel disease, drug induced colitis, or ischemic colitis. No perforation or abscess. No diverticulosis. 2.  Aortic Atherosclerosis (ICD10-I70.0). Electronically Signed   By: Suzy Bouchard M.D.   On: 10/12/2020 16:00    IMPRESSION:  *Lower GI bleed associated with and secondary to colitis seen on CT scan: Rule out infectious versus ischemic versus inflammatory.  Has a history of C. difficile in the past.  She reports having normal bowel movements leading up to this sudden onset of crampy abdominal pain followed by diarrhea and then progressing to grossly bloody stool.  This does not sound infectious.  Sounds much more ischemic.  She was  outside mowing in her yard and washing vehicles just prior to the onset so I wonder if she had some dehydration that transiently cause some ischemia. *ABLA:  Hgb 12.4 grams on admission and down to 11.5 grams today. *Chronic kidney disease stage IV *Type 2 diabetes mellitus  PLAN: *I have ordered for stool for C. difficile and GI pathogen panel as it does not look like those have been ordered, or rather looks like they may have been ordered and then discontinued.  Although after speaking with the patient I am not convinced that this is an infectious source.  She is not passing any stool per rectum, just blood. *She is already on Zosyn so we will just leave that in place for now. *No plans for endoscopic evaluation at this time unless she fails to improve over the next 24-48 hours as she had a colonoscopy in 2020. *Continue clear liquids for now. *Monitor Hgb and transfuse if need should arise.   Laban Emperor. Heather Dawson  10/13/2020, 9:00 AM

## 2020-10-13 NOTE — Progress Notes (Addendum)
Patient ID: Heather Dawson, female   DOB: 07-31-1955, 65 y.o.   MRN: [id number]  PROGRESS NOTE    Heather Dawson  ERD:[id number] DOB: 03-09-1955 DOA: 10/12/2020 PCP: Biagio Borg, MD   Brief Narrative:  65 year old female with history of hypertension, diabetes mellitus type 2, CKD stage IV, unspecified TIA/CVA, hypothyroidism presented with abdominal pain and rectal bleeding.  On presentation, CT scan of the abdomen showed left-sided colitis.  WBCs of 12,200, hemoglobin of 13.  COVID-19 and influenza testing negative on presentation.  GI was consulted.  Assessment & Plan:   Acute colitis Rectal bleeding -CT of the abdomen and pelvis showed segmental colitis of descending colon.  Stool for C. difficile and GI pathogen PCR are pending. -Patient started with diarrhea but apparently has frank rectal bleeding including this morning. -Currently on empiric Zosyn. currently on clear liquid diet.  GI consultation is pending.  Currently on Protonix 40 mg daily.  DC aspirin  Essential hypertension -Blood pressure stable.  Continue amlodipine, hydralazine and lisinopril  Diabetes mellitus type 2 -Hold Victoza.  CBGs with SSI  Chronically disease stage IV -Stable.  Monitor  Hypothyroidism-continue levothyroxine  Obesity -Outpatient follow-up   DVT prophylaxis: SCDs Code Status: Full Family Communication: Husband at bedside Disposition Plan: Status is: Inpatient  Remains inpatient appropriate because:Inpatient level of care appropriate due to severity of illness  Dispo: The patient is from: Home              Anticipated d/c is to: Home              Patient currently is not medically stable to d/c.   Difficult to place patient No   Consultants: GI  Procedures: None  Antimicrobials: Zosyn from 10/11/2020 onwards   Subjective: Patient seen and examined at bedside.  Had rectal bleeding again this morning along with intermittent abdominal cramping.  No overnight fever or vomiting  reported.  Objective: Vitals:   10/13/20 0149 10/13/20 0315 10/13/20 0516 10/13/20 0945  BP: (!) 106/49 (!) 117/58 132/68 127/63  Pulse: 70 80 75 78  Resp: 16 16 16 16   Temp: 99 F (37.2 C)  98.6 F (37 C) 98 F (36.7 C)  TempSrc: Oral  Oral Oral  SpO2: 94% 95% 94% 93%  Weight:      Height:        Intake/Output Summary (Last 24 hours) at 10/13/2020 1121 Last data filed at 10/13/2020 1022 Gross per 24 hour  Intake 624.56 ml  Output 0 ml  Net 624.56 ml   Filed Weights   10/12/20 1302  Weight: 81.6 kg    Examination:  General exam: Appears calm and comfortable.  Currently on room air Respiratory system: Bilateral decreased breath sounds at bases Cardiovascular system: S1 & S2 heard, Rate controlled Gastrointestinal system: Abdomen is nondistended, soft and mildly tender in the lower quadrant. Normal bowel sounds heard. Extremities: No cyanosis, clubbing, edema  Central nervous system: Alert and oriented. No focal neurological deficits. Moving extremities Skin: No rashes, lesions or ulcers Psychiatry: Judgement and insight appear normal. Mood & affect appropriate.     Data Reviewed: I have personally reviewed following labs and imaging studies  CBC: Recent Labs  Lab 10/12/20 1335 10/12/20 2201 10/13/20 0345 10/13/20 0930  WBC 12.2*  --  10.1  --   HGB 13.0 12.4 11.2* 11.5*  HCT 38.7 37.2 34.8* 36.4  MCV 94.4  --  100.3*  --   PLT 252  --  199  --  Basic Metabolic Panel: Recent Labs  Lab 10/12/20 1335 10/13/20 0345  NA 139 138  K 4.2 4.4  CL 106 108  CO2 22 23  GLUCOSE 119* 98  BUN 38* 34*  CREATININE 2.21* 2.11*  CALCIUM 9.5 8.5*   GFR: Estimated Creatinine Clearance: 27 mL/min (A) (by C-G formula based on SCr of 2.11 mg/dL (H)). Liver Function Tests: Recent Labs  Lab 10/12/20 1335 10/13/20 0345  AST 13* 12*  ALT 8 13  ALKPHOS 42 39  BILITOT 0.6 0.8  PROT 6.6 5.5*  ALBUMIN 4.4 3.4*   Recent Labs  Lab 10/12/20 1335  LIPASE 20   No  results for input(s): AMMONIA in the last 168 hours. Coagulation Profile: No results for input(s): INR, PROTIME in the last 168 hours. Cardiac Enzymes: No results for input(s): CKTOTAL, CKMB, CKMBINDEX, TROPONINI in the last 168 hours. BNP (last 3 results) No results for input(s): PROBNP in the last 8760 hours. HbA1C: No results for input(s): HGBA1C in the last 72 hours. CBG: No results for input(s): GLUCAP in the last 168 hours. Lipid Profile: No results for input(s): CHOL, HDL, LDLCALC, TRIG, CHOLHDL, LDLDIRECT in the last 72 hours. Thyroid Function Tests: No results for input(s): TSH, T4TOTAL, FREET4, T3FREE, THYROIDAB in the last 72 hours. Anemia Panel: No results for input(s): VITAMINB12, FOLATE, FERRITIN, TIBC, IRON, RETICCTPCT in the last 72 hours. Sepsis Labs: No results for input(s): PROCALCITON, LATICACIDVEN in the last 168 hours.  Recent Results (from the past 240 hour(s))  Resp Panel by RT-PCR (Flu A&B, Covid) Nasopharyngeal Swab     Status: None   Collection Time: 10/12/20  3:11 PM   Specimen: Nasopharyngeal Swab; Nasopharyngeal(NP) swabs in vial transport medium  Result Value Ref Range Status   SARS Coronavirus 2 by RT PCR NEGATIVE NEGATIVE Final    Comment: (NOTE) SARS-CoV-2 target nucleic acids are NOT DETECTED.  The SARS-CoV-2 RNA is generally detectable in upper respiratory specimens during the acute phase of infection. The lowest concentration of SARS-CoV-2 viral copies this assay can detect is 138 copies/mL. A negative result does not preclude SARS-Cov-2 infection and should not be used as the sole basis for treatment or other patient management decisions. A negative result may occur with  improper specimen collection/handling, submission of specimen other than nasopharyngeal swab, presence of viral mutation(s) within the areas targeted by this assay, and inadequate number of viral copies(<138 copies/mL). A negative result must be combined with clinical  observations, patient history, and epidemiological information. The expected result is Negative.  Fact Sheet for Patients:  EntrepreneurPulse.com.au  Fact Sheet for Healthcare Providers:  IncredibleEmployment.be  This test is no t yet approved or cleared by the [place] FDA and  has been authorized for detection and/or diagnosis of SARS-CoV-2 by FDA under an Emergency Use Authorization (EUA). This EUA will remain  in effect (meaning this test can be used) for the duration of the COVID-19 declaration under Section 564(b)(1) of the Act, 21 U.S.C.section 360bbb-3(b)(1), unless the authorization is terminated  or revoked sooner.       Influenza A by PCR NEGATIVE NEGATIVE Final   Influenza B by PCR NEGATIVE NEGATIVE Final    Comment: (NOTE) The Xpert Xpress SARS-CoV-2/FLU/RSV plus assay is intended as an aid in the diagnosis of influenza from Nasopharyngeal swab specimens and should not be used as a sole basis for treatment. Nasal washings and aspirates are unacceptable for Xpert Xpress SARS-CoV-2/FLU/RSV testing.  Fact Sheet for Patients: EntrepreneurPulse.com.au  Fact Sheet for Healthcare Providers: IncredibleEmployment.be  This test is not yet approved or cleared by the [place] and has been authorized for detection and/or diagnosis of SARS-CoV-2 by FDA under an Emergency Use Authorization (EUA). This EUA will remain in effect (meaning this test can be used) for the duration of the COVID-19 declaration under Section 564(b)(1) of the Act, 21 U.S.C. section 360bbb-3(b)(1), unless the authorization is terminated or revoked.  Performed at [facility], [address], [place],  [address]          Radiology Studies: CT Abdomen Pelvis Wo Contrast  Result Date: 10/12/2020 CLINICAL DATA:  Per triage nurse: Pt from Home states that she has been experiencing  abdominal pain and diarrhea since 1600 yesterday. Pt states she has had several bout of diarrhea and estimates greater than 40 x. Pt denis any other pain than .*comment was truncated*Diverticulitis suspected EXAM: CT ABDOMEN AND PELVIS WITHOUT CONTRAST TECHNIQUE: Multidetector CT imaging of the abdomen and pelvis was performed following the standard protocol without IV contrast. COMPARISON:  None. FINDINGS: Lower chest: Lung bases are clear. Hepatobiliary: No focal hepatic lesion. Postcholecystectomy. No biliary dilatation. Pancreas: Pancreas is normal. No ductal dilatation. No pancreatic inflammation. Spleen: Normal spleen Adrenals/urinary tract: Adrenal glands normal. RIGHT kidney atrophic. No obstructive uropathy. Bladder normal. Stomach/Bowel: Stomach, duodenum small-bowel normal. Terminal ileum is normal. The ascending and transverse colon normal. There is pericolonic inflammation surrounding the descending colon. Small volume fluid collects in the LEFT pericolic gutter (image 81/8). There is no diverticular disease through this segment of inflamed colon. The segment inflamed colon involves approximately 20 cm from the splenic flexure to the distal descending colon (coronal image 51/5) for example. No perforation or abscess. Sigmoid colon is normal.  Rectum normal. Vascular/Lymphatic: Abdominal aorta normal caliber with intimal calcification. There is moderate calcification of the superior mesenteric artery. No ostial occlusion identified on noncontrast exam. Reproductive: Post hysterectomy.  Adnexa unremarkable Other: No free fluid. Musculoskeletal: No aggressive osseous lesion. IMPRESSION: 1. Segmental colitis of the descending colon. Differential includes infectious colitis, inflammatory bowel disease, drug induced colitis, or ischemic colitis. No perforation or abscess. No diverticulosis. 2.  Aortic Atherosclerosis (ICD10-I70.0). Electronically Signed   By: Suzy Bouchard M.D.   On: 10/12/2020 16:00         Scheduled Meds:  amLODipine  10 mg Oral Daily   aspirin  325 mg Oral Daily   hydrALAZINE  100 mg Oral TID   levothyroxine  75 mcg Oral Q0600   lisinopril  20 mg Oral Daily   pantoprazole  40 mg Oral Daily   pravastatin  40 mg Oral q1800   Continuous Infusions:  lactated ringers 100 mL/hr at 10/13/20 0126   piperacillin-tazobactam (ZOSYN)  IV 3.375 g (10/13/20 0631)          Aline August, MD [facility] 10/13/2020, 11:21 AM

## 2020-10-14 DIAGNOSIS — N184 Chronic kidney disease, stage 4 (severe): Secondary | ICD-10-CM | POA: Diagnosis not present

## 2020-10-14 DIAGNOSIS — K922 Gastrointestinal hemorrhage, unspecified: Secondary | ICD-10-CM | POA: Diagnosis not present

## 2020-10-14 DIAGNOSIS — I10 Essential (primary) hypertension: Secondary | ICD-10-CM | POA: Diagnosis not present

## 2020-10-14 DIAGNOSIS — K559 Vascular disorder of intestine, unspecified: Principal | ICD-10-CM

## 2020-10-14 DIAGNOSIS — K529 Noninfective gastroenteritis and colitis, unspecified: Secondary | ICD-10-CM | POA: Diagnosis not present

## 2020-10-14 LAB — C DIFFICILE QUICK SCREEN W PCR REFLEX
C Diff antigen: NEGATIVE
C Diff interpretation: NOT DETECTED
C Diff toxin: NEGATIVE

## 2020-10-14 LAB — GASTROINTESTINAL PANEL BY PCR, STOOL (REPLACES STOOL CULTURE)

## 2020-10-14 LAB — CBC WITH DIFFERENTIAL/PLATELET
Abs Immature Granulocytes: 0.03 10*3/uL (ref 0.00–0.07)
Basophils Absolute: 0 10*3/uL (ref 0.0–0.1)
Basophils Relative: 0 %
Eosinophils Absolute: 0.2 10*3/uL (ref 0.0–0.5)
Eosinophils Relative: 2 %
HCT: 32.3 % — ABNORMAL LOW (ref 36.0–46.0)
Hemoglobin: 10.2 g/dL — ABNORMAL LOW (ref 12.0–15.0)
Immature Granulocytes: 0 %
Lymphocytes Relative: 16 %
Lymphs Abs: 1.5 10*3/uL (ref 0.7–4.0)
MCH: 31.8 pg (ref 26.0–34.0)
MCHC: 31.6 g/dL (ref 30.0–36.0)
MCV: 100.6 fL — ABNORMAL HIGH (ref 80.0–100.0)
Monocytes Absolute: 0.6 10*3/uL (ref 0.1–1.0)
Monocytes Relative: 7 %
Neutro Abs: 7.1 10*3/uL (ref 1.7–7.7)
Neutrophils Relative %: 75 %
Platelets: 171 10*3/uL (ref 150–400)
RBC: 3.21 MIL/uL — ABNORMAL LOW (ref 3.87–5.11)
RDW: 11.9 % (ref 11.5–15.5)
WBC: 9.5 10*3/uL (ref 4.0–10.5)
nRBC: 0 % (ref 0.0–0.2)

## 2020-10-14 LAB — BASIC METABOLIC PANEL
Anion gap: 6 (ref 5–15)
BUN: 32 mg/dL — ABNORMAL HIGH (ref 8–23)
CO2: 24 mmol/L (ref 22–32)
Calcium: 8.2 mg/dL — ABNORMAL LOW (ref 8.9–10.3)
Chloride: 106 mmol/L (ref 98–111)
Creatinine, Ser: 2.4 mg/dL — ABNORMAL HIGH (ref 0.44–1.00)
GFR, Estimated: 22 mL/min — ABNORMAL LOW (ref >60)
Glucose, Bld: 88 mg/dL (ref 70–99)
Potassium: 4.5 mmol/L (ref 3.5–5.1)
Sodium: 136 mmol/L (ref 135–145)

## 2020-10-14 LAB — MAGNESIUM: Magnesium: 1.9 mg/dL (ref 1.7–2.4)

## 2020-10-14 LAB — SEDIMENTATION RATE: Sed Rate: 16 mm/hr (ref 0–22)

## 2020-10-14 LAB — C-REACTIVE PROTEIN: CRP: 0.6 mg/dL (ref <1.0)

## 2020-10-14 MED ORDER — OXYCODONE HCL 5 MG PO TABS
5.0000 mg | ORAL_TABLET | ORAL | Status: DC | PRN
  Administered 2020-10-14 (×2): 5 mg via ORAL
  Filled 2020-10-14 (×2): qty 1

## 2020-10-14 MED ORDER — DICYCLOMINE HCL 20 MG PO TABS
10.0000 mg | ORAL_TABLET | Freq: Three times a day (TID) | ORAL | Status: DC
  Administered 2020-10-14 – 2020-10-16 (×7): 10 mg via ORAL
  Filled 2020-10-14 (×7): qty 1

## 2020-10-14 MED ORDER — DICYCLOMINE HCL 20 MG PO TABS
20.0000 mg | ORAL_TABLET | Freq: Three times a day (TID) | ORAL | Status: DC | PRN
  Filled 2020-10-14: qty 1

## 2020-10-14 MED ORDER — SODIUM CHLORIDE 0.9 % IV SOLN
INTRAVENOUS | Status: DC
Start: 2020-10-14 — End: 2020-10-15

## 2020-10-14 MED ORDER — HYDROMORPHONE HCL 1 MG/ML IJ SOLN: 0.5000 mg | INTRAMUSCULAR | Status: DC | PRN

## 2020-10-14 NOTE — Care Management Important Message (Signed)
Important Message  Patient Details  Name: Heather Dawson MRN: [id number] Date of Birth: 1955-10-30   Medicare Important Message Given:  Yes     Sherie Don, LCSW 10/14/2020, 11:09 AM

## 2020-10-14 NOTE — Care Management Important Message (Signed)
Medicare IM printed for [facility] to give to the patient. 

## 2020-10-14 NOTE — Progress Notes (Signed)
[facility] Progress Note  CC:  GI bleed, colitis  Subjective:  Feeling a little better.  Had 2 BMs without blood today.  Still with a lot of left sided pain when up moving around and stuff.  Would like to try to advance diet.  Says that the dicyclomine seemed to help yesterday but she declined it this morning because she was not having much cramping but now the cramping has started again.  Objective:  Vital signs in last 24 hours: Temp:  [98 F (36.7 C)-100.3 F (37.9 C)] 98.5 F (36.9 C) (08/26 0509) Pulse Rate:  [57-78] 57 (08/26 0509) Resp:  [16-18] 16 (08/26 0509) BP: (106-127)/(48-63) 106/49 (08/26 0509) SpO2:  [92 %-97 %] 94 % (08/26 0509) Last BM Date: 10/14/20 General:  Alert, Well-developed, in NAD Heart:  Slightly bradycardic; no murmurs Pulm:  CTAB.  No W/R/R. Abdomen:  Soft, non-distended.  BS present.  Moderate left sided TTP. Extremities:  Without edema. Neurologic:  Alert and oriented x 4;  grossly normal neurologically. Psych:  Alert and cooperative. Normal mood and affect.  Intake/Output from previous day: 08/25 0701 - 08/26 0700 In: 2895.7 [P.O.:840; I.V.:1870.5; IV Piggyback:185.2] Out: 0   Lab Results: Recent Labs    10/12/20 1335 10/12/20 2201 10/13/20 0345 10/13/20 0930 10/13/20 1544 10/14/20 0432  WBC 12.2*  --  10.1  --   --  9.5  HGB 13.0   < > 11.2* 11.5* 10.4* 10.2*  HCT 38.7   < > 34.8* 36.4 31.8* 32.3*  PLT 252  --  199  --   --  171   < > = values in this interval not displayed.   BMET Recent Labs    10/12/20 1335 10/13/20 0345 10/14/20 0432  NA 139 138 136  K 4.2 4.4 4.5  CL 106 108 106  CO2 22 23 24   GLUCOSE 119* 98 88  BUN 38* 34* 32*  CREATININE 2.21* 2.11* 2.40*  CALCIUM 9.5 8.5* 8.2*   LFT Recent Labs    10/13/20 0345  PROT 5.5*  ALBUMIN 3.4*  AST 12*  ALT 13  ALKPHOS 39  BILITOT 0.8   CT Abdomen Pelvis Wo Contrast  Result Date: 10/12/2020 CLINICAL DATA:  Per triage nurse: Pt from Home  states that she has been experiencing abdominal pain and diarrhea since 1600 yesterday. Pt states she has had several bout of diarrhea and estimates greater than 40 x. Pt denis any other pain than .*comment was truncated*Diverticulitis suspected EXAM: CT ABDOMEN AND PELVIS WITHOUT CONTRAST TECHNIQUE: Multidetector CT imaging of the abdomen and pelvis was performed following the standard protocol without IV contrast. COMPARISON:  None. FINDINGS: Lower chest: Lung bases are clear. Hepatobiliary: No focal hepatic lesion. Postcholecystectomy. No biliary dilatation. Pancreas: Pancreas is normal. No ductal dilatation. No pancreatic inflammation. Spleen: Normal spleen Adrenals/urinary tract: Adrenal glands normal. RIGHT kidney atrophic. No obstructive uropathy. Bladder normal. Stomach/Bowel: Stomach, duodenum small-bowel normal. Terminal ileum is normal. The ascending and transverse colon normal. There is pericolonic inflammation surrounding the descending colon. Small volume fluid collects in the LEFT pericolic gutter (image 25/0). There is no diverticular disease through this segment of inflamed colon. The segment inflamed colon involves approximately 20 cm from the splenic flexure to the distal descending colon (coronal image 51/5) for example. No perforation or abscess. Sigmoid colon is normal.  Rectum normal. Vascular/Lymphatic: Abdominal aorta normal caliber with intimal calcification. There is moderate calcification of the superior mesenteric artery. No ostial occlusion identified on noncontrast  exam. Reproductive: Post hysterectomy.  Adnexa unremarkable Other: No free fluid. Musculoskeletal: No aggressive osseous lesion. IMPRESSION: 1. Segmental colitis of the descending colon. Differential includes infectious colitis, inflammatory bowel disease, drug induced colitis, or ischemic colitis. No perforation or abscess. No diverticulosis. 2.  Aortic Atherosclerosis (ICD10-I70.0). Electronically Signed   By: Suzy Bouchard M.D.   On: 10/12/2020 16:00    Assessment / Plan: *Lower GI bleed associated with and secondary to colitis seen on CT scan: Rule out infectious versus ischemic versus inflammatory.  Has a history of C. difficile in the past.  She reports having normal bowel movements leading up to this sudden onset of crampy abdominal pain followed by diarrhea and then progressing to grossly bloody stool.  This does not sound infectious.  Sounds much more ischemic.  She was outside mowing in her yard and washing vehicles just prior to the onset so I wonder if she had some dehydration that transiently cause some ischemia.  Cdiff negative.  GI pathogen panel negative. Sed rate and CRP normal. *ABLA:  Hgb 10.2 grams this morning, which is stable compared to the previous draw, overall down about 2 grams since admission. *Chronic kidney disease stage IV *Type 2 diabetes mellitus  -Will advance to full liquids for now.  ? Soft diet later today if she tolerates that well. -Changed dicyclomine to 10 mg TID instead of prn for now.   LOS: 2 days   Laban Emperor. Bela Bonaparte  10/14/2020, 9:21 AM

## 2020-10-14 NOTE — Plan of Care (Signed)
  Problem: Education: Goal: Knowledge of General Education information will improve Description Including pain rating scale, medication(s)/side effects and non-pharmacologic comfort measures Outcome: Progressing   Problem: Nutrition: Goal: Adequate nutrition will be maintained Outcome: Progressing   Problem: Pain Managment: Goal: General experience of comfort will improve Outcome: Progressing   

## 2020-10-14 NOTE — Progress Notes (Signed)
Patient ID: Heather Dawson, female   DOB: [birth year], 65 y.o.   MRN: [id number]  PROGRESS NOTE    Heather Dawson  TKW:[id number] DOB: [birth year] DOA: 10/12/2020 PCP: Biagio Borg, MD   Brief Narrative:  65 year old female with history of hypertension, diabetes mellitus type 2, CKD stage IV, unspecified TIA/CVA, hypothyroidism presented with abdominal pain and rectal bleeding.  On presentation, CT scan of the abdomen showed left-sided colitis.  WBCs of 12,200, hemoglobin of 13.  COVID-19 and influenza testing negative on presentation.  GI was consulted.  Assessment & Plan:   Acute colitis Rectal bleeding -CT of the abdomen and pelvis showed segmental colitis of descending colon.  Stool for C. difficile negative.  GI pathogen PCR pending. -Patient started with diarrhea but subsequently started having frank rectal bleeding  -Currently on empiric Zosyn.  Diet advancement as per GI -GI following and is of the opinion that this might be ischemic colitis.  No plans for any endoscopic evaluation currently.  - Currently on Protonix 40 mg daily.  Off aspirin -Hemoglobin 10.2 this morning.  Essential hypertension -Blood pressure on the lower side.  Hold antihypertensives for now.  Diabetes mellitus type 2 -Hold Victoza.  CBGs with SSI  Chronically disease stage IV -Creatinine slightly elevated compared to yesterday.  Monitor.  Switch IV fluids to normal saline at 75 cc an hour.  Hypothyroidism-continue levothyroxine  Obesity -Outpatient follow-up   DVT prophylaxis: SCDs Code Status: Full Family Communication: Husband at bedside Disposition Plan: Status is: Inpatient  Remains inpatient appropriate because:Inpatient level of care appropriate due to severity of illness  Dispo: The patient is from: Home              Anticipated d/c is to: Home              Patient currently is not medically stable to d/c.   Difficult to place patient No   Consultants: GI  Procedures:  None  Antimicrobials: Zosyn from 10/11/2020 onwards   Subjective: Patient seen and examined at bedside.  Denies worsening nausea or vomiting.  Still having intermittent abdominal pain with some dark stools.  Denies worsening shortness of breath or chest pain. Objective: Vitals:   10/13/20 1341 10/13/20 1638 10/13/20 2158 10/14/20 0509  BP: (!) 108/51 (!) 115/48 (!) 121/54 (!) 106/49  Pulse: 70 68 68 (!) 57  Resp: 18 18 16 16   Temp: 98.7 F (37.1 C)  100.3 F (37.9 C) 98.5 F (36.9 C)  TempSrc: Oral  Oral Oral  SpO2: 97% 96% 92% 94%  Weight:      Height:        Intake/Output Summary (Last 24 hours) at 10/14/2020 0816 Last data filed at 10/14/2020 9242 Gross per 24 hour  Intake 2895.7 ml  Output 0 ml  Net 2895.7 ml    Filed Weights   10/12/20 1302  Weight: 81.6 kg    Examination:  General exam: Still on room air.  No distress.   Respiratory system: Decreased breath sounds at bases bilaterally cardiovascular system: Intermittently bradycardic, S1-S2 heard  gastrointestinal system: Abdomen is obese, mildly distended, soft and still mildly tender in the lower quadrant.  Bowel sounds are heard Extremities: Trace lower extremity edema; no cyanosis  Central nervous system: Awake and alert.  No focal neurological deficits.  Moves extremities  skin: No obvious ecchymosis/lesions  psychiatry: Mood, affect and judgment are normal.     Data Reviewed: I have personally reviewed following labs and imaging studies  CBC: Recent  Labs  Lab 10/12/20 1335 10/12/20 2201 10/13/20 0345 10/13/20 0930 10/13/20 1544 10/14/20 0432  WBC 12.2*  --  10.1  --   --  9.5  NEUTROABS  --   --   --   --   --  7.1  HGB 13.0 12.4 11.2* 11.5* 10.4* 10.2*  HCT 38.7 37.2 34.8* 36.4 31.8* 32.3*  MCV 94.4  --  100.3*  --   --  100.6*  PLT 252  --  199  --   --  540    Basic Metabolic Panel: Recent Labs  Lab 10/12/20 1335 10/13/20 0345 10/14/20 0432  NA 139 138 136  K 4.2 4.4 4.5  CL 106  108 106  CO2 22 23 24   GLUCOSE 119* 98 88  BUN 38* 34* 32*  CREATININE 2.21* 2.11* 2.40*  CALCIUM 9.5 8.5* 8.2*  MG  --   --  1.9    GFR: Estimated Creatinine Clearance: 23.7 mL/min (A) (by C-G formula based on SCr of 2.4 mg/dL (H)). Liver Function Tests: Recent Labs  Lab 10/12/20 1335 10/13/20 0345  AST 13* 12*  ALT 8 13  ALKPHOS 42 39  BILITOT 0.6 0.8  PROT 6.6 5.5*  ALBUMIN 4.4 3.4*    Recent Labs  Lab 10/12/20 1335  LIPASE 20    No results for input(s): AMMONIA in the last 168 hours. Coagulation Profile: No results for input(s): INR, PROTIME in the last 168 hours. Cardiac Enzymes: No results for input(s): CKTOTAL, CKMB, CKMBINDEX, TROPONINI in the last 168 hours. BNP (last 3 results) No results for input(s): PROBNP in the last 8760 hours. HbA1C: No results for input(s): HGBA1C in the last 72 hours. CBG: No results for input(s): GLUCAP in the last 168 hours. Lipid Profile: No results for input(s): CHOL, HDL, LDLCALC, TRIG, CHOLHDL, LDLDIRECT in the last 72 hours. Thyroid Function Tests: No results for input(s): TSH, T4TOTAL, FREET4, T3FREE, THYROIDAB in the last 72 hours. Anemia Panel: No results for input(s): VITAMINB12, FOLATE, FERRITIN, TIBC, IRON, RETICCTPCT in the last 72 hours. Sepsis Labs: No results for input(s): PROCALCITON, LATICACIDVEN in the last 168 hours.  Recent Results (from the past 240 hour(s))  Resp Panel by RT-PCR (Flu A&B, Covid) Nasopharyngeal Swab     Status: None   Collection Time: 10/12/20  3:11 PM   Specimen: Nasopharyngeal Swab; Nasopharyngeal(NP) swabs in vial transport medium  Result Value Ref Range Status   SARS Coronavirus 2 by RT PCR NEGATIVE NEGATIVE Final    Comment: (NOTE) SARS-CoV-2 target nucleic acids are NOT DETECTED.  The SARS-CoV-2 RNA is generally detectable in upper respiratory specimens during the acute phase of infection. The lowest concentration of SARS-CoV-2 viral copies this assay can detect is 138  copies/mL. A negative result does not preclude SARS-Cov-2 infection and should not be used as the sole basis for treatment or other patient management decisions. A negative result may occur with  improper specimen collection/handling, submission of specimen other than nasopharyngeal swab, presence of viral mutation(s) within the areas targeted by this assay, and inadequate number of viral copies(<138 copies/mL). A negative result must be combined with clinical observations, patient history, and epidemiological information. The expected result is Negative.  Fact Sheet for Patients:  EntrepreneurPulse.com.au  Fact Sheet for Healthcare Providers:  IncredibleEmployment.be  This test is no t yet approved or cleared by the [place] FDA and  has been authorized for detection and/or diagnosis of SARS-CoV-2 by FDA under an Emergency Use Authorization (EUA). This EUA will remain  in effect (meaning this test can be used) for the duration of the COVID-19 declaration under Section 564(b)(1) of the Act, 21 U.S.C.section 360bbb-3(b)(1), unless the authorization is terminated  or revoked sooner.       Influenza A by PCR NEGATIVE NEGATIVE Final   Influenza B by PCR NEGATIVE NEGATIVE Final    Comment: (NOTE) The Xpert Xpress SARS-CoV-2/FLU/RSV plus assay is intended as an aid in the diagnosis of influenza from Nasopharyngeal swab specimens and should not be used as a sole basis for treatment. Nasal washings and aspirates are unacceptable for Xpert Xpress SARS-CoV-2/FLU/RSV testing.  Fact Sheet for Patients: EntrepreneurPulse.com.au  Fact Sheet for Healthcare Providers: IncredibleEmployment.be  This test is not yet approved or cleared by the [place] FDA and has been authorized for detection and/or diagnosis of SARS-CoV-2 by FDA under an Emergency Use Authorization (EUA). This EUA will remain in effect (meaning  this test can be used) for the duration of the COVID-19 declaration under Section 564(b)(1) of the Act, 21 U.S.C. section 360bbb-3(b)(1), unless the authorization is terminated or revoked.  Performed at [facility], [place], [place] [address]   C Difficile Quick Screen w PCR reflex     Status: None   Collection Time: 10/13/20 12:23 AM   Specimen: STOOL  Result Value Ref Range Status   C Diff antigen NEGATIVE NEGATIVE Final   C Diff toxin NEGATIVE NEGATIVE Final   C Diff interpretation No C. difficile detected.  Final    Comment: Performed at [facility], [place] [address] [place], [place] [address]          Radiology Studies: CT Abdomen Pelvis Wo Contrast  Result Date: 10/12/2020 CLINICAL DATA:  Per triage nurse: Pt from Home states that she has been experiencing abdominal pain and diarrhea since 1600 yesterday. Pt states she has had several bout of diarrhea and estimates greater than 40 x. Pt denis any other pain than .*comment was truncated*Diverticulitis suspected EXAM: CT ABDOMEN AND PELVIS WITHOUT CONTRAST TECHNIQUE: Multidetector CT imaging of the abdomen and pelvis was performed following the standard protocol without IV contrast. COMPARISON:  None. FINDINGS: Lower chest: Lung bases are clear. Hepatobiliary: No focal hepatic lesion. Postcholecystectomy. No biliary dilatation. Pancreas: Pancreas is normal. No ductal dilatation. No pancreatic inflammation. Spleen: Normal spleen Adrenals/urinary tract: Adrenal glands normal. RIGHT kidney atrophic. No obstructive uropathy. Bladder normal. Stomach/Bowel: Stomach, duodenum small-bowel normal. Terminal ileum is normal. The ascending and transverse colon normal. There is pericolonic inflammation surrounding the descending colon. Small volume fluid collects in the LEFT pericolic gutter (image 32/9). There is no diverticular disease through this segment of inflamed colon. The segment  inflamed colon involves approximately 20 cm from the splenic flexure to the distal descending colon (coronal image 51/5) for example. No perforation or abscess. Sigmoid colon is normal.  Rectum normal. Vascular/Lymphatic: Abdominal aorta normal caliber with intimal calcification. There is moderate calcification of the superior mesenteric artery. No ostial occlusion identified on noncontrast exam. Reproductive: Post hysterectomy.  Adnexa unremarkable Other: No free fluid. Musculoskeletal: No aggressive osseous lesion. IMPRESSION: 1. Segmental colitis of the descending colon. Differential includes infectious colitis, inflammatory bowel disease, drug induced colitis, or ischemic colitis. No perforation or abscess. No diverticulosis. 2.  Aortic Atherosclerosis (ICD10-I70.0). Electronically Signed   By: Suzy Bouchard M.D.   On: 10/12/2020 16:00        Scheduled Meds:  amLODipine  10 mg Oral Daily   hydrALAZINE  100 mg Oral TID  levothyroxine  75 mcg Oral Q0600   lisinopril  20 mg Oral Daily   pantoprazole  40 mg Oral Daily   pravastatin  40 mg Oral q1800   Continuous Infusions:  lactated ringers 75 mL/hr at 10/13/20 1200   piperacillin-tazobactam (ZOSYN)  IV 3.375 g (10/14/20 0177)          Aline August, MD [facility] 10/14/2020, 8:16 AM

## 2020-10-15 DIAGNOSIS — K529 Noninfective gastroenteritis and colitis, unspecified: Secondary | ICD-10-CM | POA: Diagnosis not present

## 2020-10-15 DIAGNOSIS — N184 Chronic kidney disease, stage 4 (severe): Secondary | ICD-10-CM | POA: Diagnosis not present

## 2020-10-15 DIAGNOSIS — I10 Essential (primary) hypertension: Secondary | ICD-10-CM | POA: Diagnosis not present

## 2020-10-15 DIAGNOSIS — K922 Gastrointestinal hemorrhage, unspecified: Secondary | ICD-10-CM | POA: Diagnosis not present

## 2020-10-15 LAB — CBC WITH DIFFERENTIAL/PLATELET
Abs Immature Granulocytes: 0.02 10*3/uL (ref 0.00–0.07)
Basophils Absolute: 0 10*3/uL (ref 0.0–0.1)
Basophils Relative: 0 %
Eosinophils Absolute: 0.3 10*3/uL (ref 0.0–0.5)
Eosinophils Relative: 4 %
HCT: 33.4 % — ABNORMAL LOW (ref 36.0–46.0)
Hemoglobin: 10.7 g/dL — ABNORMAL LOW (ref 12.0–15.0)
Immature Granulocytes: 0 %
Lymphocytes Relative: 21 %
Lymphs Abs: 1.5 10*3/uL (ref 0.7–4.0)
MCH: 32.8 pg (ref 26.0–34.0)
MCHC: 32 g/dL (ref 30.0–36.0)
MCV: 102.5 fL — ABNORMAL HIGH (ref 80.0–100.0)
Monocytes Absolute: 0.5 10*3/uL (ref 0.1–1.0)
Monocytes Relative: 7 %
Neutro Abs: 4.7 10*3/uL (ref 1.7–7.7)
Neutrophils Relative %: 68 %
Platelets: 186 10*3/uL (ref 150–400)
RBC: 3.26 MIL/uL — ABNORMAL LOW (ref 3.87–5.11)
RDW: 11.8 % (ref 11.5–15.5)
WBC: 7.1 10*3/uL (ref 4.0–10.5)
nRBC: 0 % (ref 0.0–0.2)

## 2020-10-15 LAB — BASIC METABOLIC PANEL
Anion gap: 6 (ref 5–15)
BUN: 25 mg/dL — ABNORMAL HIGH (ref 8–23)
CO2: 27 mmol/L (ref 22–32)
Calcium: 8.7 mg/dL — ABNORMAL LOW (ref 8.9–10.3)
Chloride: 111 mmol/L (ref 98–111)
Creatinine, Ser: 2.07 mg/dL — ABNORMAL HIGH (ref 0.44–1.00)
GFR, Estimated: 26 mL/min — ABNORMAL LOW (ref >60)
Glucose, Bld: 109 mg/dL — ABNORMAL HIGH (ref 70–99)
Potassium: 4.1 mmol/L (ref 3.5–5.1)
Sodium: 144 mmol/L (ref 135–145)

## 2020-10-15 NOTE — Progress Notes (Signed)
CROSS [facility]OVER [facility] Subjective: Heather Dawson is a 65 year old white female with a history of left-sided colitis noted on CT scan on after she was hospitalized for left lower quadrant pain with leukocytosis.  She was suspected to have ischemic colitis and is being maintained on antibiotics prior to discharge.  She denies having any abdominal pain nausea vomiting.  She has had 2 BMs today without any evidence of hematochezia.  She claims she does not feel very well but denies any specific complaints at this time.  She is interested in going home tomorrow. Seems to be doing well clinically and is not in any distress at this time  Objective: Vital signs in last 24 hours: Temp:  [98.5 F (36.9 C)-98.8 F (37.1 C)] 98.5 F (36.9 C) (08/27 0509) Pulse Rate:  [62-78] 62 (08/27 0509) Resp:  [16] 16 (08/27 0509) BP: (112-143)/(52-75) 143/75 (08/27 0509) SpO2:  [95 %-96 %] 95 % (08/27 0509) Last BM Date: 10/14/20  Intake/Output from previous day: 08/26 0701 - 08/27 0700 In: 2053.9 [P.O.:240; I.V.:1699; IV Piggyback:114.9] Out: 0  Intake/Output this shift: No intake/output data recorded.  General appearance: alert, cooperative, appears stated age, and moderately obese Resp: clear to auscultation bilaterally Cardio: regular rate and rhythm, S1, S2 normal, no murmur, click, rub or gallop GI: soft, non-tender; bowel sounds normal; no masses,  no organomegaly Extremities: extremities normal, atraumatic, no cyanosis or edema  Lab Results: Recent Labs    10/13/20 0345 10/13/20 0930 10/13/20 1544 10/14/20 0432 10/15/20 0518  WBC 10.1  --   --  9.5 7.1  HGB 11.2*   < > 10.4* 10.2* 10.7*  HCT 34.8*   < > 31.8* 32.3* 33.4*  PLT 199  --   --  171 186   < > = values in this interval not displayed.   BMET Recent Labs    10/13/20 0345 10/14/20 0432 10/15/20 0518  NA 138 136 144  K 4.4 4.5 4.1  CL 108 106 111  CO2 23 24 27   GLUCOSE 98 88 109*  BUN 34* 32* 25*  CREATININE 2.11* 2.40* 2.07*   CALCIUM 8.5* 8.2* 8.7*   LFT Recent Labs    10/13/20 0345  PROT 5.5*  ALBUMIN 3.4*  AST 12*  ALT 13  ALKPHOS 39  BILITOT 0.8   C-Diff Recent Labs    10/13/20 0023  CDIFFTOX NEGATIVE   Medications: I have reviewed the patient's current medications.  Assessment/Plan: 1) Segmental colitis on CT scan question infectious versus inflammatory colitis-peers to be ischemic patient seems to be doing well on the present treatment regimen.  I think she will be optimized for discharge tomorrow. 2) Diverticulosis in the sigmoid colon. 3) Anemia of chronic disease.  LOS: 3 days   Juanita Craver 10/15/2020, 7:16 AM

## 2020-10-15 NOTE — Progress Notes (Signed)
Patient ID: Heather Dawson, female   DOB: 02-25-1955, 65 y.o.   MRN: [id number]  PROGRESS NOTE    Heather Dawson  JKD:[id number] DOB: [birth year] DOA: 10/12/2020 PCP: Biagio Borg, MD   Brief Narrative:  65 year old female with history of hypertension, diabetes mellitus type 2, CKD stage IV, unspecified TIA/CVA, hypothyroidism presented with abdominal pain and rectal bleeding.  On presentation, CT scan of the abdomen showed left-sided colitis.  WBCs of 12,200, hemoglobin of 13.  COVID-19 and influenza testing negative on presentation.  GI was consulted.  Assessment & Plan:   Acute colitis Rectal bleeding -CT of the abdomen and pelvis showed segmental colitis of descending colon.  Stool for C. difficile and GI pathogen PCR negative. -Currently on empiric Zosyn.  Diet advancement as per GI -GI following and is of the opinion that this might be ischemic colitis.  No plans for any endoscopic evaluation currently.  - Currently on Protonix 40 mg daily.  Off aspirin -Hemoglobin 10.7 this morning.  Essential hypertension -Blood pressure on the lower side.  Antihypertensives on hold since 10/14/2020.  Blood pressure currently stable.  Diabetes mellitus type 2 -Victoza on hold.  CBGs with SSI  Chronically disease stage IV -Creatinine improving and stable.  Monitor.   -We will DC IV fluids today  Hypothyroidism-continue levothyroxine  Obesity -Outpatient follow-up   DVT prophylaxis: SCDs Code Status: Full Family Communication: Husband at bedside Disposition Plan: Status is: Inpatient  Remains inpatient appropriate because:Inpatient level of care appropriate due to severity of illness  Dispo: The patient is from: Home              Anticipated d/c is to: Home              Patient currently is not medically stable to d/c.   Difficult to place patient No   Consultants: GI  Procedures: None  Antimicrobials: Zosyn from 10/11/2020 onwards   Subjective: Patient seen and examined at  bedside.  No overnight fever, vomiting reported.  Still having some abdominal pain but improving.  No overnight rectal bleeding.  No worsening shortness of breath.   Objective: Vitals:   10/14/20 0509 10/14/20 1421 10/14/20 2147 10/15/20 0509  BP: (!) 106/49 112/64 (!) 124/52 (!) 143/75  Pulse: (!) 57 78 70 62  Resp: 16 16 16 16   Temp: 98.5 F (36.9 C) 98.8 F (37.1 C) 98.8 F (37.1 C) 98.5 F (36.9 C)  TempSrc: Oral Oral Oral Oral  SpO2: 94% 96% 95% 95%  Weight:      Height:        Intake/Output Summary (Last 24 hours) at 10/15/2020 0803 Last data filed at 10/15/2020 0600 Gross per 24 hour  Intake 2053.88 ml  Output 0 ml  Net 2053.88 ml    Filed Weights   10/12/20 1302  Weight: 81.6 kg    Examination:  General exam: No acute distress.  On room air currently. Respiratory system: Bilateral decreased breath sounds at bases cardiovascular system: S1-S2 heard; intermittent bradycardia present gastrointestinal system: Abdomen is obese, distended slightly, soft and still mildly tender in the lower quadrant.  Normal bowel sounds heard Extremities: No clubbing; bilateral lower extremity trace edema present Central nervous system: Alert and oriented.  No focal neurological deficits.  Moving extremities  skin: No obvious petechiae/rashes psychiatry: Judgment, affect and mood are normal  Data Reviewed: I have personally reviewed following labs and imaging studies  CBC: Recent Labs  Lab 10/12/20 1335 10/12/20 2201 10/13/20 0345 10/13/20 0930 10/13/20  1544 10/14/20 0432 10/15/20 0518  WBC 12.2*  --  10.1  --   --  9.5 7.1  NEUTROABS  --   --   --   --   --  7.1 4.7  HGB 13.0   < > 11.2* 11.5* 10.4* 10.2* 10.7*  HCT 38.7   < > 34.8* 36.4 31.8* 32.3* 33.4*  MCV 94.4  --  100.3*  --   --  100.6* 102.5*  PLT 252  --  199  --   --  171 186   < > = values in this interval not displayed.    Basic Metabolic Panel: Recent Labs  Lab 10/12/20 1335 10/13/20 0345 10/14/20 0432  10/15/20 0518  NA 139 138 136 144  K 4.2 4.4 4.5 4.1  CL 106 108 106 111  CO2 22 23 24 27   GLUCOSE 119* 98 88 109*  BUN 38* 34* 32* 25*  CREATININE 2.21* 2.11* 2.40* 2.07*  CALCIUM 9.5 8.5* 8.2* 8.7*  MG  --   --  1.9  --     GFR: Estimated Creatinine Clearance: 27.5 mL/min (A) (by C-G formula based on SCr of 2.07 mg/dL (H)). Liver Function Tests: Recent Labs  Lab 10/12/20 1335 10/13/20 0345  AST 13* 12*  ALT 8 13  ALKPHOS 42 39  BILITOT 0.6 0.8  PROT 6.6 5.5*  ALBUMIN 4.4 3.4*    Recent Labs  Lab 10/12/20 1335  LIPASE 20    No results for input(s): AMMONIA in the last 168 hours. Coagulation Profile: No results for input(s): INR, PROTIME in the last 168 hours. Cardiac Enzymes: No results for input(s): CKTOTAL, CKMB, CKMBINDEX, TROPONINI in the last 168 hours. BNP (last 3 results) No results for input(s): PROBNP in the last 8760 hours. HbA1C: No results for input(s): HGBA1C in the last 72 hours. CBG: No results for input(s): GLUCAP in the last 168 hours. Lipid Profile: No results for input(s): CHOL, HDL, LDLCALC, TRIG, CHOLHDL, LDLDIRECT in the last 72 hours. Thyroid Function Tests: No results for input(s): TSH, T4TOTAL, FREET4, T3FREE, THYROIDAB in the last 72 hours. Anemia Panel: No results for input(s): VITAMINB12, FOLATE, FERRITIN, TIBC, IRON, RETICCTPCT in the last 72 hours. Sepsis Labs: No results for input(s): PROCALCITON, LATICACIDVEN in the last 168 hours.  Recent Results (from the past 240 hour(s))  Resp Panel by RT-PCR (Flu A&B, Covid) Nasopharyngeal Swab     Status: None   Collection Time: 10/12/20  3:11 PM   Specimen: Nasopharyngeal Swab; Nasopharyngeal(NP) swabs in vial transport medium  Result Value Ref Range Status   SARS Coronavirus 2 by RT PCR NEGATIVE NEGATIVE Final    Comment: (NOTE) SARS-CoV-2 target nucleic acids are NOT DETECTED.  The SARS-CoV-2 RNA is generally detectable in upper respiratory specimens during the acute phase of  infection. The lowest concentration of SARS-CoV-2 viral copies this assay can detect is 138 copies/mL. A negative result does not preclude SARS-Cov-2 infection and should not be used as the sole basis for treatment or other patient management decisions. A negative result may occur with  improper specimen collection/handling, submission of specimen other than nasopharyngeal swab, presence of viral mutation(s) within the areas targeted by this assay, and inadequate number of viral copies(<138 copies/mL). A negative result must be combined with clinical observations, patient history, and epidemiological information. The expected result is Negative.  Fact Sheet for Patients:  EntrepreneurPulse.com.au  Fact Sheet for Healthcare Providers:  IncredibleEmployment.be  This test is no t yet approved or cleared by the  [place] Architectural [employer] and  has been authorized for detection and/or diagnosis of SARS-CoV-2 by FDA under an Print production planner (EUA). This EUA will remain  in effect (meaning this test can be used) for the duration of the COVID-19 declaration under Section 564(b)(1) of the Act, 21 U.S.C.section 360bbb-3(b)(1), unless the authorization is terminated  or revoked sooner.       Influenza A by PCR NEGATIVE NEGATIVE Final   Influenza B by PCR NEGATIVE NEGATIVE Final    Comment: (NOTE) The Xpert Xpress SARS-CoV-2/FLU/RSV plus assay is intended as an aid in the diagnosis of influenza from Nasopharyngeal swab specimens and should not be used as a sole basis for treatment. Nasal washings and aspirates are unacceptable for Xpert Xpress SARS-CoV-2/FLU/RSV testing.  Fact Sheet for Patients: EntrepreneurPulse.com.au  Fact Sheet for Healthcare Providers: IncredibleEmployment.be  This test is not yet approved or cleared by the [place] FDA and has been authorized for detection and/or diagnosis of SARS-CoV-2  by FDA under an Emergency Use Authorization (EUA). This EUA will remain in effect (meaning this test can be used) for the duration of the COVID-19 declaration under Section 564(b)(1) of the Act, 21 U.S.C. section 360bbb-3(b)(1), unless the authorization is terminated or revoked.  Performed at [facility], [place], [place] [address]   C Difficile Quick Screen w PCR reflex     Status: None   Collection Time: 10/13/20 12:23 AM   Specimen: STOOL  Result Value Ref Range Status   C Diff antigen NEGATIVE NEGATIVE Final   C Diff toxin NEGATIVE NEGATIVE Final   C Diff interpretation No C. difficile detected.  Final    Comment: Performed at [facility], [place] [address], [place], [place] [address]  Gastrointestinal Panel by PCR , Stool     Status: None   Collection Time: 10/13/20 12:23 AM   Specimen: STOOL  Result Value Ref Range Status   Campylobacter species NOT DETECTED NOT DETECTED Final   Plesimonas shigelloides NOT DETECTED NOT DETECTED Final   Salmonella species NOT DETECTED NOT DETECTED Final   Yersinia enterocolitica NOT DETECTED NOT DETECTED Final   Vibrio species NOT DETECTED NOT DETECTED Final   Vibrio cholerae NOT DETECTED NOT DETECTED Final   Enteroaggregative E coli (EAEC) NOT DETECTED NOT DETECTED Final   Enteropathogenic E coli (EPEC) NOT DETECTED NOT DETECTED Final   Enterotoxigenic E coli (ETEC) NOT DETECTED NOT DETECTED Final   Shiga like toxin producing E coli (STEC) NOT DETECTED NOT DETECTED Final   Shigella/Enteroinvasive E coli (EIEC) NOT DETECTED NOT DETECTED Final   Cryptosporidium NOT DETECTED NOT DETECTED Final   Cyclospora cayetanensis NOT DETECTED NOT DETECTED Final   Entamoeba histolytica NOT DETECTED NOT DETECTED Final   Giardia lamblia NOT DETECTED NOT DETECTED Final   Adenovirus F40/41 NOT DETECTED NOT DETECTED Final   Astrovirus NOT DETECTED NOT DETECTED Final   Norovirus GI/GII NOT DETECTED NOT  DETECTED Final   Rotavirus A NOT DETECTED NOT DETECTED Final   Sapovirus (I, II, IV, and V) NOT DETECTED NOT DETECTED Final    Comment: Performed at [facility], [address] [place], [place] [address]          Radiology Studies: No results found.      Scheduled Meds:  dicyclomine  10 mg Oral TID   levothyroxine  75 mcg Oral Q0600   pantoprazole  40 mg Oral Daily   pravastatin  40 mg Oral q1800   Continuous Infusions:  sodium  chloride 75 mL/hr at 10/15/20 0451   piperacillin-tazobactam (ZOSYN)  IV 3.375 g (10/15/20 0600)          Aline August, MD [facility] 10/15/2020, 8:03 AM

## 2020-10-16 DIAGNOSIS — N184 Chronic kidney disease, stage 4 (severe): Secondary | ICD-10-CM | POA: Diagnosis not present

## 2020-10-16 DIAGNOSIS — I10 Essential (primary) hypertension: Secondary | ICD-10-CM | POA: Diagnosis not present

## 2020-10-16 DIAGNOSIS — K922 Gastrointestinal hemorrhage, unspecified: Secondary | ICD-10-CM | POA: Diagnosis not present

## 2020-10-16 DIAGNOSIS — K529 Noninfective gastroenteritis and colitis, unspecified: Secondary | ICD-10-CM | POA: Diagnosis not present

## 2020-10-16 LAB — CBC WITH DIFFERENTIAL/PLATELET
Abs Immature Granulocytes: 0.01 10*3/uL (ref 0.00–0.07)
Basophils Absolute: 0 10*3/uL (ref 0.0–0.1)
Basophils Relative: 0 %
Eosinophils Absolute: 0.3 10*3/uL (ref 0.0–0.5)
Eosinophils Relative: 4 %
HCT: 31 % — ABNORMAL LOW (ref 36.0–46.0)
Hemoglobin: 10.4 g/dL — ABNORMAL LOW (ref 12.0–15.0)
Immature Granulocytes: 0 %
Lymphocytes Relative: 27 %
Lymphs Abs: 2 10*3/uL (ref 0.7–4.0)
MCH: 32.8 pg (ref 26.0–34.0)
MCHC: 33.5 g/dL (ref 30.0–36.0)
MCV: 97.8 fL (ref 80.0–100.0)
Monocytes Absolute: 0.5 10*3/uL (ref 0.1–1.0)
Monocytes Relative: 7 %
Neutro Abs: 4.4 10*3/uL (ref 1.7–7.7)
Neutrophils Relative %: 62 %
Platelets: 174 10*3/uL (ref 150–400)
RBC: 3.17 MIL/uL — ABNORMAL LOW (ref 3.87–5.11)
RDW: 11.8 % (ref 11.5–15.5)
WBC: 7.2 10*3/uL (ref 4.0–10.5)
nRBC: 0 % (ref 0.0–0.2)

## 2020-10-16 LAB — BASIC METABOLIC PANEL
Anion gap: 5 (ref 5–15)
BUN: 20 mg/dL (ref 8–23)
CO2: 27 mmol/L (ref 22–32)
Calcium: 8.8 mg/dL — ABNORMAL LOW (ref 8.9–10.3)
Chloride: 111 mmol/L (ref 98–111)
Creatinine, Ser: 2.16 mg/dL — ABNORMAL HIGH (ref 0.44–1.00)
GFR, Estimated: 25 mL/min — ABNORMAL LOW (ref >60)
Glucose, Bld: 111 mg/dL — ABNORMAL HIGH (ref 70–99)
Potassium: 4.3 mmol/L (ref 3.5–5.1)
Sodium: 143 mmol/L (ref 135–145)

## 2020-10-16 MED ORDER — DICYCLOMINE HCL 20 MG PO TABS: 10.0000 mg | ORAL_TABLET | Freq: Three times a day (TID) | ORAL | 0 refills | Status: DC | PRN

## 2020-10-16 MED ORDER — AMOXICILLIN-POT CLAVULANATE 875-125 MG PO TABS: 1.0000 | ORAL_TABLET | Freq: Two times a day (BID) | ORAL | 0 refills | Status: DC

## 2020-10-16 MED ORDER — ONDANSETRON HCL 4 MG PO TABS: 4.0000 mg | ORAL_TABLET | Freq: Four times a day (QID) | ORAL | 0 refills | Status: DC | PRN

## 2020-10-16 MED ORDER — OXYCODONE HCL 5 MG PO TABS: 5.0000 mg | ORAL_TABLET | ORAL | 0 refills | Status: DC | PRN

## 2020-10-16 NOTE — Discharge Summary (Signed)
Physician Discharge Summary  Heather Dawson [id number] DOB: Sep 08, 1955 DOA: 10/12/2020  PCP: Biagio Borg, MD  Admit date: 10/12/2020 Discharge date: 10/16/2020  Admitted From: Home Disposition: Home  Recommendations for Outpatient Follow-up:  Follow up with PCP in 1 week with repeat CBC/BMP Outpatient follow-up with GI Follow up in ED if symptoms worsen or new appear   Home Health: No Equipment/Devices: None  Discharge Condition: Stable CODE STATUS: Full Diet recommendation: Heart healthy/carb modified  Brief/Interim Summary: 65 year old female with history of hypertension, diabetes mellitus type 2, CKD stage IV, unspecified TIA/CVA, hypothyroidism presented with abdominal pain and rectal bleeding.  On presentation, CT scan of the abdomen showed left-sided colitis.  WBCs of 12,200, hemoglobin of 13.  COVID-19 and influenza testing negative on presentation.  GI was consulted.  Patient was managed conservatively.  GI thought that symptoms were consistent with ischemic colitis.  She was treated with IV antibiotics.  Symptoms have subsequently been improving.  Hemoglobin has remained stable.  Rectal bleeding is improving.  GI has cleared the patient for discharge home today on oral Augmentin.  She will be discharged home today.  Outpatient follow-up with GI.  Discharge Diagnoses:   Acute colitis Rectal bleeding -CT of the abdomen and pelvis showed segmental colitis of descending colon.  Stool for C. difficile and GI pathogen PCR negative. -Currently on empiric Zosyn.   -GI following and is of the opinion that this might be ischemic colitis.  No plans for any endoscopic evaluation currently.  - Currently on Protonix 40 mg daily.  Off aspirin -Hemoglobin 10.4 this morning. -Currently tolerating diet and rectal bleeding has much improved.  GI has cleared the patient for discharge on oral Augmentin.  Discharge patient home today on oral Augmentin for 3 more days.  Outpatient follow-up with  GI.   Essential hypertension -Blood pressure on the lower side.  Antihypertensives on hold since 10/14/2020.  Blood pressure currently stable.  Resume amlodipine and hydralazine on discharge.  Keep lisinopril on hold till reevaluation by PCP  Diabetes mellitus type 2 -Carb modified diet.  Continue home regimen  Chronically disease stage IV -Creatinine stable.  Monitor.   -Off IV fluids.  Outpatient follow-up with nephrology  Hypothyroidism-continue levothyroxine  Obesity -Outpatient follow-up   Discharge Instructions  Discharge Instructions     Ambulatory referral to Gastroenterology   Complete by: As directed    Diet - low sodium heart healthy   Complete by: As directed    Diet Carb Modified   Complete by: As directed    Increase activity slowly   Complete by: As directed       Allergies as of 10/16/2020       Reactions   Bactrim [sulfamethoxazole-trimethoprim] Nausea And Vomiting   Cefuroxime Axetil Swelling    edema/swelling   Ciprofloxacin Swelling   Doxycycline Nausea Only   Levaquin [levofloxacin In D5w] Other (See Comments)   GI upset   Lipitor [atorvastatin] Other (See Comments)   Myalgia   Lyrica [pregabalin] Other (See Comments)   HA, confusion, swelling   Macrodantin [nitrofurantoin Macrocrystal] Other (See Comments)   GI upset, diarrhea and sob   Phentermine Other (See Comments)   All the side effects listed on the paper   Gabapentin Rash        Medication List     STOP taking these medications    aspirin 325 MG EC tablet   lisinopril 20 MG tablet Commonly known as: ZESTRIL       TAKE these  medications    acetaminophen 500 MG tablet Commonly known as: TYLENOL Take 1,000 mg by mouth every 8 (eight) hours as needed (pain).   amLODipine 10 MG tablet Commonly known as: NORVASC Take 1 tablet (10 mg total) by mouth daily.   amoxicillin-clavulanate 875-125 MG tablet Commonly known as: Augmentin Take 1 tablet by mouth 2 (two) times  daily for 3 days.   clobetasol 0.05 % external solution Commonly known as: TEMOVATE Apply 1 application topically daily as needed (scalp psoriasis).   diclofenac sodium 1 % Gel Commonly known as: Voltaren Apply 4 g topically 4 (four) times daily as needed. What changed: reasons to take this   dicyclomine 20 MG tablet Commonly known as: BENTYL Take 0.5 tablets (10 mg total) by mouth 3 (three) times daily as needed for spasms.   estradiol 0.1 MG/GM vaginal cream Commonly known as: ESTRACE Place 1 Applicatorful vaginally daily as needed (yeast infection).   hydrALAZINE 100 MG tablet Commonly known as: APRESOLINE Take 1 tablet (100 mg total) by mouth 3 (three) times daily.   ID Now COVID-19 Kit Generic drug: COVID-19 Test See admin instructions.   levocetirizine 5 MG tablet Commonly known as: XYZAL Take 5 mg by mouth every evening.   levothyroxine 75 MCG tablet Commonly known as: SYNTHROID TAKE ONE TABLET BY MOUTH DAILY What changed: when to take this   lovastatin 40 MG tablet Commonly known as: MEVACOR Take 2 tablets (80 mg total) by mouth at bedtime.   Novofine Pen Needle 32G X 6 MM Misc Generic drug: Insulin Pen Needle Use as directed once weekly E11.9   ondansetron 4 MG tablet Commonly known as: ZOFRAN Take 1 tablet (4 mg total) by mouth every 6 (six) hours as needed for nausea.   oxyCODONE 5 MG immediate release tablet Commonly known as: Oxy IR/ROXICODONE Take 1 tablet (5 mg total) by mouth every 4 (four) hours as needed for moderate pain.   pantoprazole 40 MG tablet Commonly known as: PROTONIX TAKE ONE TABLET BY MOUTH DAILY   SUMAtriptan 100 MG tablet Commonly known as: IMITREX TAKE ONE TABLET BY MOUTH AT ONSET OF MIGRAINE OR HEADACHE; MAY REPEAT ONE TABLET IN 2 HOURS IF NEEDED. What changed:  how much to take how to take this when to take this reasons to take this   triamcinolone cream 0.1 % Commonly known as: KENALOG Apply 1 application topically  daily.   TURMERIC CURCUMIN PO Take 1 tablet by mouth daily.   Victoza 18 MG/3ML Sopn Generic drug: liraglutide Inject 1.8 mg into the skin daily. What changed:  how much to take Another medication with the same name was removed. Continue taking this medication, and follow the directions you see here.   vitamin C 1000 MG tablet Take 1,000 mg by mouth daily.   Vitamin D3 25 MCG (1000 UT) Caps Take 2 capsules (2,000 Units total) by mouth daily.        Follow-up Information     Biagio Borg, MD. Schedule an appointment as soon as possible for a visit in 1 week(s).   Specialties: Internal Medicine, Radiology Why: with cbc/bmp Contact information: [place] [address] [phone]         Donato Heinz, MD. Schedule an appointment as soon as possible for a visit in 1 week(s).   Specialty: Nephrology Contact information: [place] [address] [phone]                Allergies  Allergen Reactions  Bactrim [Sulfamethoxazole-Trimethoprim] Nausea And Vomiting   Cefuroxime Axetil Swelling     edema/swelling   Ciprofloxacin Swelling   Doxycycline Nausea Only   Levaquin [Levofloxacin In D5w] Other (See Comments)    GI upset   Lipitor [Atorvastatin] Other (See Comments)    Myalgia    Lyrica [Pregabalin] Other (See Comments)    HA, confusion, swelling   Macrodantin [Nitrofurantoin Macrocrystal] Other (See Comments)    GI upset, diarrhea and sob   Phentermine Other (See Comments)    All the side effects listed on the paper   Gabapentin Rash    Consultations: GI   Procedures/Studies: CT Abdomen Pelvis Wo Contrast  Result Date: 10/12/2020 CLINICAL DATA:  Per triage nurse: Pt from Home states that she has been experiencing abdominal pain and diarrhea since 1600 yesterday. Pt states she has had several bout of diarrhea and estimates greater than 40 x. Pt denis any other pain than .*comment was  truncated*Diverticulitis suspected EXAM: CT ABDOMEN AND PELVIS WITHOUT CONTRAST TECHNIQUE: Multidetector CT imaging of the abdomen and pelvis was performed following the standard protocol without IV contrast. COMPARISON:  None. FINDINGS: Lower chest: Lung bases are clear. Hepatobiliary: No focal hepatic lesion. Postcholecystectomy. No biliary dilatation. Pancreas: Pancreas is normal. No ductal dilatation. No pancreatic inflammation. Spleen: Normal spleen Adrenals/urinary tract: Adrenal glands normal. RIGHT kidney atrophic. No obstructive uropathy. Bladder normal. Stomach/Bowel: Stomach, duodenum small-bowel normal. Terminal ileum is normal. The ascending and transverse colon normal. There is pericolonic inflammation surrounding the descending colon. Small volume fluid collects in the LEFT pericolic gutter (image 13/2). There is no diverticular disease through this segment of inflamed colon. The segment inflamed colon involves approximately 20 cm from the splenic flexure to the distal descending colon (coronal image 51/5) for example. No perforation or abscess. Sigmoid colon is normal.  Rectum normal. Vascular/Lymphatic: Abdominal aorta normal caliber with intimal calcification. There is moderate calcification of the superior mesenteric artery. No ostial occlusion identified on noncontrast exam. Reproductive: Post hysterectomy.  Adnexa unremarkable Other: No free fluid. Musculoskeletal: No aggressive osseous lesion. IMPRESSION: 1. Segmental colitis of the descending colon. Differential includes infectious colitis, inflammatory bowel disease, drug induced colitis, or ischemic colitis. No perforation or abscess. No diverticulosis. 2.  Aortic Atherosclerosis (ICD10-I70.0). Electronically Signed   By: Suzy Bouchard M.D.   On: 10/12/2020 16:00      Subjective: Patient seen and examined at bedside.  She feels much better and feels that she is ready to go home today.  Denies any acute bleeding yesterday or this  morning.  Abdominal pain is improving.  No overnight fever, vomiting or worsening shortness of breath reported.  Discharge Exam: Vitals:   10/15/20 2050 10/16/20 0516  BP: (!) 142/60 (!) 165/78  Pulse: (!) 53 (!) 58  Resp: 18 16  Temp: 98.5 F (36.9 C) 98.1 F (36.7 C)  SpO2: 93% 95%    General: Pt is alert, awake, not in acute distress Cardiovascular: rate controlled, S1/S2 + Respiratory: bilateral decreased breath sounds at bases Abdominal: Soft, obese, mildly tender in the lower quadrant,, ND, bowel sounds + Extremities: Trace lower extremity edema; no cyanosis    The results of significant diagnostics from this hospitalization (including imaging, microbiology, ancillary and laboratory) are listed below for reference.     Microbiology: Recent Results (from the past 240 hour(s))  Resp Panel by RT-PCR (Flu A&B, Covid) Nasopharyngeal Swab     Status: None   Collection Time: 10/12/20  3:11 PM   Specimen: Nasopharyngeal Swab; Nasopharyngeal(NP) swabs  in vial transport medium  Result Value Ref Range Status   SARS Coronavirus 2 by RT PCR NEGATIVE NEGATIVE Final    Comment: (NOTE) SARS-CoV-2 target nucleic acids are NOT DETECTED.  The SARS-CoV-2 RNA is generally detectable in upper respiratory specimens during the acute phase of infection. The lowest concentration of SARS-CoV-2 viral copies this assay can detect is 138 copies/mL. A negative result does not preclude SARS-Cov-2 infection and should not be used as the sole basis for treatment or other patient management decisions. A negative result may occur with  improper specimen collection/handling, submission of specimen other than nasopharyngeal swab, presence of viral mutation(s) within the areas targeted by this assay, and inadequate number of viral copies(<138 copies/mL). A negative result must be combined with clinical observations, patient history, and epidemiological information. The expected result is  Negative.  Fact Sheet for Patients:  EntrepreneurPulse.com.au  Fact Sheet for Healthcare Providers:  IncredibleEmployment.be  This test is no t yet approved or cleared by the [place] FDA and  has been authorized for detection and/or diagnosis of SARS-CoV-2 by FDA under an Emergency Use Authorization (EUA). This EUA will remain  in effect (meaning this test can be used) for the duration of the COVID-19 declaration under Section 564(b)(1) of the Act, 21 U.S.C.section 360bbb-3(b)(1), unless the authorization is terminated  or revoked sooner.       Influenza A by PCR NEGATIVE NEGATIVE Final   Influenza B by PCR NEGATIVE NEGATIVE Final    Comment: (NOTE) The Xpert Xpress SARS-CoV-2/FLU/RSV plus assay is intended as an aid in the diagnosis of influenza from Nasopharyngeal swab specimens and should not be used as a sole basis for treatment. Nasal washings and aspirates are unacceptable for Xpert Xpress SARS-CoV-2/FLU/RSV testing.  Fact Sheet for Patients: EntrepreneurPulse.com.au  Fact Sheet for Healthcare Providers: IncredibleEmployment.be  This test is not yet approved or cleared by the [place] FDA and has been authorized for detection and/or diagnosis of SARS-CoV-2 by FDA under an Emergency Use Authorization (EUA). This EUA will remain in effect (meaning this test can be used) for the duration of the COVID-19 declaration under Section 564(b)(1) of the Act, 21 U.S.C. section 360bbb-3(b)(1), unless the authorization is terminated or revoked.  Performed at [facility], [place], [place] [address]   C Difficile Quick Screen w PCR reflex     Status: None   Collection Time: 10/13/20 12:23 AM   Specimen: STOOL  Result Value Ref Range Status   C Diff antigen NEGATIVE NEGATIVE Final   C Diff toxin NEGATIVE NEGATIVE Final   C Diff interpretation No C. difficile  detected.  Final    Comment: Performed at [facility], [place] [address] [place], [place] [address]  Gastrointestinal Panel by PCR , Stool     Status: None   Collection Time: 10/13/20 12:23 AM   Specimen: STOOL  Result Value Ref Range Status   Campylobacter species NOT DETECTED NOT DETECTED Final   Plesimonas shigelloides NOT DETECTED NOT DETECTED Final   Salmonella species NOT DETECTED NOT DETECTED Final   Yersinia enterocolitica NOT DETECTED NOT DETECTED Final   Vibrio species NOT DETECTED NOT DETECTED Final   Vibrio cholerae NOT DETECTED NOT DETECTED Final   Enteroaggregative E coli (EAEC) NOT DETECTED NOT DETECTED Final   Enteropathogenic E coli (EPEC) NOT DETECTED NOT DETECTED Final   Enterotoxigenic E coli (ETEC) NOT DETECTED NOT DETECTED Final   Shiga like toxin producing E coli (STEC) NOT DETECTED NOT DETECTED  Final   Shigella/Enteroinvasive E coli (EIEC) NOT DETECTED NOT DETECTED Final   Cryptosporidium NOT DETECTED NOT DETECTED Final   Cyclospora cayetanensis NOT DETECTED NOT DETECTED Final   Entamoeba histolytica NOT DETECTED NOT DETECTED Final   Giardia lamblia NOT DETECTED NOT DETECTED Final   Adenovirus F40/41 NOT DETECTED NOT DETECTED Final   Astrovirus NOT DETECTED NOT DETECTED Final   Norovirus GI/GII NOT DETECTED NOT DETECTED Final   Rotavirus A NOT DETECTED NOT DETECTED Final   Sapovirus (I, II, IV, and V) NOT DETECTED NOT DETECTED Final    Comment: Performed at [facility], [address] [place], [place] [address]     Labs: BNP (last 3 results) No results for input(s): BNP in the last 8760 hours. Basic Metabolic Panel: Recent Labs  Lab 10/12/20 1335 10/13/20 0345 10/14/20 0432 10/15/20 0518 10/16/20 0455  NA 139 138 136 144 143  K 4.2 4.4 4.5 4.1 4.3  CL 106 108 106 111 111  CO2 22 23 24 27 27   GLUCOSE 119* 98 88 109* 111*  BUN 38* 34* 32* 25* 20  CREATININE 2.21* 2.11* 2.40* 2.07* 2.16*  CALCIUM 9.5 8.5* 8.2* 8.7* 8.8*   MG  --   --  1.9  --   --    Liver Function Tests: Recent Labs  Lab 10/12/20 1335 10/13/20 0345  AST 13* 12*  ALT 8 13  ALKPHOS 42 39  BILITOT 0.6 0.8  PROT 6.6 5.5*  ALBUMIN 4.4 3.4*   Recent Labs  Lab 10/12/20 1335  LIPASE 20   No results for input(s): AMMONIA in the last 168 hours. CBC: Recent Labs  Lab 10/12/20 1335 10/12/20 2201 10/13/20 0345 10/13/20 0930 10/13/20 1544 10/14/20 0432 10/15/20 0518 10/16/20 0455  WBC 12.2*  --  10.1  --   --  9.5 7.1 7.2  NEUTROABS  --   --   --   --   --  7.1 4.7 4.4  HGB 13.0   < > 11.2* 11.5* 10.4* 10.2* 10.7* 10.4*  HCT 38.7   < > 34.8* 36.4 31.8* 32.3* 33.4* 31.0*  MCV 94.4  --  100.3*  --   --  100.6* 102.5* 97.8  PLT 252  --  199  --   --  171 186 174   < > = values in this interval not displayed.   Cardiac Enzymes: No results for input(s): CKTOTAL, CKMB, CKMBINDEX, TROPONINI in the last 168 hours. BNP: Invalid input(s): POCBNP CBG: No results for input(s): GLUCAP in the last 168 hours. D-Dimer No results for input(s): DDIMER in the last 72 hours. Hgb A1c No results for input(s): HGBA1C in the last 72 hours. Lipid Profile No results for input(s): CHOL, HDL, LDLCALC, TRIG, CHOLHDL, LDLDIRECT in the last 72 hours. Thyroid function studies No results for input(s): TSH, T4TOTAL, T3FREE, THYROIDAB in the last 72 hours.  Invalid input(s): FREET3 Anemia work up No results for input(s): VITAMINB12, FOLATE, FERRITIN, TIBC, IRON, RETICCTPCT in the last 72 hours. Urinalysis    Component Value Date/Time   COLORURINE YELLOW 09/10/2020 2107   APPEARANCEUR CLEAR 09/10/2020 2107   LABSPEC 1.014 09/10/2020 2107   PHURINE 6.0 09/10/2020 2107   GLUCOSEU NEGATIVE 09/10/2020 2107   GLUCOSEU NEGATIVE 05/10/2020 0917   HGBUR NEGATIVE 09/10/2020 2107   HGBUR negative 01/10/2009 1507   BILIRUBINUR NEGATIVE 09/10/2020 2107   BILIRUBINUR neg 04/12/2020 [place] 09/10/2020 2107   PROTEINUR NEGATIVE 09/10/2020  2107   UROBILINOGEN 0.2 05/10/2020  [place] 09/10/2020 2107   LEUKOCYTESUR NEGATIVE 09/10/2020 2107   Sepsis Labs Invalid input(s): PROCALCITONIN,  WBC,  LACTICIDVEN Microbiology Recent Results (from the past 240 hour(s))  Resp Panel by RT-PCR (Flu A&B, Covid) Nasopharyngeal Swab     Status: None   Collection Time: 10/12/20  3:11 PM   Specimen: Nasopharyngeal Swab; Nasopharyngeal(NP) swabs in vial transport medium  Result Value Ref Range Status   SARS Coronavirus 2 by RT PCR NEGATIVE NEGATIVE Final    Comment: (NOTE) SARS-CoV-2 target nucleic acids are NOT DETECTED.  The SARS-CoV-2 RNA is generally detectable in upper respiratory specimens during the acute phase of infection. The lowest concentration of SARS-CoV-2 viral copies this assay can detect is 138 copies/mL. A negative result does not preclude SARS-Cov-2 infection and should not be used as the sole basis for treatment or other patient management decisions. A negative result may occur with  improper specimen collection/handling, submission of specimen other than nasopharyngeal swab, presence of viral mutation(s) within the areas targeted by this assay, and inadequate number of viral copies(<138 copies/mL). A negative result must be combined with clinical observations, patient history, and epidemiological information. The expected result is Negative.  Fact Sheet for Patients:  EntrepreneurPulse.com.au  Fact Sheet for Healthcare Providers:  IncredibleEmployment.be  This test is no t yet approved or cleared by the [place] FDA and  has been authorized for detection and/or diagnosis of SARS-CoV-2 by FDA under an Emergency Use Authorization (EUA). This EUA will remain  in effect (meaning this test can be used) for the duration of the COVID-19 declaration under Section 564(b)(1) of the Act, 21 U.S.C.section 360bbb-3(b)(1), unless the authorization is terminated  or  revoked sooner.       Influenza A by PCR NEGATIVE NEGATIVE Final   Influenza B by PCR NEGATIVE NEGATIVE Final    Comment: (NOTE) The Xpert Xpress SARS-CoV-2/FLU/RSV plus assay is intended as an aid in the diagnosis of influenza from Nasopharyngeal swab specimens and should not be used as a sole basis for treatment. Nasal washings and aspirates are unacceptable for Xpert Xpress SARS-CoV-2/FLU/RSV testing.  Fact Sheet for Patients: EntrepreneurPulse.com.au  Fact Sheet for Healthcare Providers: IncredibleEmployment.be  This test is not yet approved or cleared by the [place] FDA and has been authorized for detection and/or diagnosis of SARS-CoV-2 by FDA under an Emergency Use Authorization (EUA). This EUA will remain in effect (meaning this test can be used) for the duration of the COVID-19 declaration under Section 564(b)(1) of the Act, 21 U.S.C. section 360bbb-3(b)(1), unless the authorization is terminated or revoked.  Performed at [facility], [place], [place] [address]   C Difficile Quick Screen w PCR reflex     Status: None   Collection Time: 10/13/20 12:23 AM   Specimen: STOOL  Result Value Ref Range Status   C Diff antigen NEGATIVE NEGATIVE Final   C Diff toxin NEGATIVE NEGATIVE Final   C Diff interpretation No C. difficile detected.  Final    Comment: Performed at [facility], [place] [address] [place], [place] [address]  Gastrointestinal Panel by PCR , Stool     Status: None   Collection Time: 10/13/20 12:23 AM   Specimen: STOOL  Result Value Ref Range Status   Campylobacter species NOT DETECTED NOT DETECTED Final   Plesimonas shigelloides NOT DETECTED NOT DETECTED Final   Salmonella species NOT DETECTED NOT DETECTED Final   Yersinia enterocolitica NOT DETECTED NOT DETECTED Final  Vibrio species NOT DETECTED NOT DETECTED Final   Vibrio cholerae NOT DETECTED NOT  DETECTED Final   Enteroaggregative E coli (EAEC) NOT DETECTED NOT DETECTED Final   Enteropathogenic E coli (EPEC) NOT DETECTED NOT DETECTED Final   Enterotoxigenic E coli (ETEC) NOT DETECTED NOT DETECTED Final   Shiga like toxin producing E coli (STEC) NOT DETECTED NOT DETECTED Final   Shigella/Enteroinvasive E coli (EIEC) NOT DETECTED NOT DETECTED Final   Cryptosporidium NOT DETECTED NOT DETECTED Final   Cyclospora cayetanensis NOT DETECTED NOT DETECTED Final   Entamoeba histolytica NOT DETECTED NOT DETECTED Final   Giardia lamblia NOT DETECTED NOT DETECTED Final   Adenovirus F40/41 NOT DETECTED NOT DETECTED Final   Astrovirus NOT DETECTED NOT DETECTED Final   Norovirus GI/GII NOT DETECTED NOT DETECTED Final   Rotavirus A NOT DETECTED NOT DETECTED Final   Sapovirus (I, II, IV, and V) NOT DETECTED NOT DETECTED Final    Comment: Performed at [facility], [address], [place], [place] [address]     Time coordinating discharge: 35 minutes  SIGNED:   Aline August, MD  [facility] 10/16/2020, 10:07 AM

## 2020-10-16 NOTE — Plan of Care (Signed)

## 2020-10-16 NOTE — Progress Notes (Signed)
Pt was discharged home today. Instructions were reviewed with patient, and questions were answered. Pt was taken to main entrance via wheelchair by NT.  

## 2020-10-17 ENCOUNTER — Telehealth: Payer: Self-pay

## 2020-10-17 ENCOUNTER — Telehealth: Payer: Self-pay | Admitting: Neurology

## 2020-10-17 NOTE — Telephone Encounter (Signed)
Pt was scheduled an appt with Dr. Henrene Pastor on 11/30/20 at 9:20  Pt made aware

## 2020-10-17 NOTE — Telephone Encounter (Signed)
-----   Message from Irving Copas., MD sent at 10/14/2020  5:30 PM EDT ----- Regarding: Follow-up Vaughan Basta, Can you please schedule this patient a follow-up with one of the APP's or Dr. Henrene Pastor in 6 weeks? Ischemic colitis first episode. Thanks. GM

## 2020-10-17 NOTE — Telephone Encounter (Signed)
Pt called, had diarrhea that turned into just blood for 21/2 days. While in hospital due to Colitis, Aspirin was discontinued for 2 weeks. Would like a call from the nurse to discuss if ok to be taken off Aspirin.

## 2020-10-17 NOTE — Telephone Encounter (Signed)
I spoke to the patient. She had a recent flare-up of colitis that resulted in 2.5 days of bloody, diarrhea. Reports aspirin 325mg  daily was held last week while she was in the hospital. Additionally, she has been instructed to hold it for two more weeks. She wanted to provided Dr. Leonie Man with this update. She is also asking if it would be appropriate to lower her aspirin dose to 81mg  daily when she restarts the medication.

## 2020-10-18 ENCOUNTER — Encounter: Payer: Self-pay | Admitting: Internal Medicine

## 2020-10-18 ENCOUNTER — Other Ambulatory Visit: Payer: Self-pay

## 2020-10-18 ENCOUNTER — Ambulatory Visit (INDEPENDENT_AMBULATORY_CARE_PROVIDER_SITE_OTHER): Payer: Medicare Other | Admitting: Internal Medicine

## 2020-10-18 VITALS — BP 168/78 | HR 63 | Temp 99.0°F | Ht 62.0 in | Wt 181.0 lb

## 2020-10-18 DIAGNOSIS — E119 Type 2 diabetes mellitus without complications: Secondary | ICD-10-CM | POA: Diagnosis not present

## 2020-10-18 DIAGNOSIS — N184 Chronic kidney disease, stage 4 (severe): Secondary | ICD-10-CM

## 2020-10-18 DIAGNOSIS — I10 Essential (primary) hypertension: Secondary | ICD-10-CM | POA: Diagnosis not present

## 2020-10-18 DIAGNOSIS — K529 Noninfective gastroenteritis and colitis, unspecified: Secondary | ICD-10-CM

## 2020-10-18 LAB — LIPID PANEL
Cholesterol: 139 mg/dL (ref 0–200)
HDL: 33 mg/dL — ABNORMAL LOW (ref >39.00)
LDL Cholesterol: 67 mg/dL (ref 0–99)
NonHDL: 106.48
Total CHOL/HDL Ratio: 4
Triglycerides: 199 mg/dL — ABNORMAL HIGH (ref 0.0–149.0)
VLDL: 39.8 mg/dL (ref 0.0–40.0)

## 2020-10-18 LAB — BASIC METABOLIC PANEL
BUN: 22 mg/dL (ref 6–23)
CO2: 23 mEq/L (ref 19–32)
Calcium: 9.4 mg/dL (ref 8.4–10.5)
Chloride: 109 mEq/L (ref 96–112)
Creatinine, Ser: 1.69 mg/dL — ABNORMAL HIGH (ref 0.40–1.20)
GFR: 31.86 mL/min — ABNORMAL LOW (ref >60.00)
Glucose, Bld: 91 mg/dL (ref 70–99)
Potassium: 4.1 mEq/L (ref 3.5–5.1)
Sodium: 142 mEq/L (ref 135–145)

## 2020-10-18 LAB — HEPATIC FUNCTION PANEL
ALT: 10 U/L (ref 0–35)
AST: 13 U/L (ref 0–37)
Albumin: 4.1 g/dL (ref 3.5–5.2)
Alkaline Phosphatase: 46 U/L (ref 39–117)
Bilirubin, Direct: 0.1 mg/dL (ref 0.0–0.3)
Total Bilirubin: 0.3 mg/dL (ref 0.2–1.2)
Total Protein: 6.7 g/dL (ref 6.0–8.3)

## 2020-10-18 LAB — CBC WITH DIFFERENTIAL/PLATELET
Basophils Absolute: 0 10*3/uL (ref 0.0–0.1)
Basophils Relative: 0.4 % (ref 0.0–3.0)
Eosinophils Absolute: 0.2 10*3/uL (ref 0.0–0.7)
Eosinophils Relative: 2.2 % (ref 0.0–5.0)
HCT: 35.9 % — ABNORMAL LOW (ref 36.0–46.0)
Hemoglobin: 12.3 g/dL (ref 12.0–15.0)
Lymphocytes Relative: 18.7 % (ref 12.0–46.0)
Lymphs Abs: 1.3 10*3/uL (ref 0.7–4.0)
MCHC: 34.3 g/dL (ref 30.0–36.0)
MCV: 94.9 fl (ref 78.0–100.0)
Monocytes Absolute: 0.4 10*3/uL (ref 0.1–1.0)
Monocytes Relative: 6.2 % (ref 3.0–12.0)
Neutro Abs: 5.1 10*3/uL (ref 1.4–7.7)
Neutrophils Relative %: 72.5 % (ref 43.0–77.0)
Platelets: 215 10*3/uL (ref 150.0–400.0)
RBC: 3.79 Mil/uL — ABNORMAL LOW (ref 3.87–5.11)
RDW: 12.2 % (ref 11.5–15.5)
WBC: 7.1 10*3/uL (ref 4.0–10.5)

## 2020-10-18 LAB — HEMOGLOBIN A1C: Hgb A1c MFr Bld: 5.6 % (ref 4.6–6.5)

## 2020-10-18 MED ORDER — AMOXICILLIN-POT CLAVULANATE 875-125 MG PO TABS: 1.0000 | ORAL_TABLET | Freq: Two times a day (BID) | ORAL | 0 refills | Status: AC

## 2020-10-18 NOTE — Patient Instructions (Signed)
Ok to restart the lisinopril if the blood work today is ok for the kidneys  Ok to hold the ASA 325 for the 2 wks as you mentioned  Ok to hold the victoza indefinitely for now, at least until the GI issues are cleared up  [facility] to take the 3 days of augmentin as was intended when you were discharged from the hospital  Please continue all other medications as before, and refills have been done if requested.  Please have the pharmacy call with any other refills you may need.  Please continue your efforts at being more active, low cholesterol diet, and weight control.  You are otherwise up to date with prevention measures today.  Please keep your appointments with your specialists as you may have planned- GI on Oct 12  Please go to the LAB at the blood drawing area for the tests to be done  You will be contacted by phone if any changes need to be made immediately.  Otherwise, you will receive a letter about your results with an explanation, but please check with MyChart first.  Please remember to sign up for MyChart if you have not done so, as this will be important to you in the future with finding out test results, communicating by private email, and scheduling acute appointments online when needed.  Please make an Appointment to return in 6 months, or sooner if needed

## 2020-10-18 NOTE — Telephone Encounter (Addendum)
I spoke to the patient and reviewed Dr. Clydene Fake response. She is in agreement to change to enteric coated aspirin 81mg , one tablet daily, once cleared by her PCP to restart.

## 2020-10-18 NOTE — Progress Notes (Signed)
Patient ID: Heather Dawson, female   DOB: [birth year], 65 y.o.   MRN: [id number]        Chief Complaint: follow up rrecent hospn        HPI:  Heather Dawson is a 65 y.o. female here post aug 24 - 28 hospn  with left sided colitis  On presentation, CT scan of the abdomen showed left-sided colitis.  WBCs of 12,200, hemoglobin of 13.  COVID-19 and influenza testing negative on presentation.  GI was consulted.  Patient was managed conservatively.  GI thought that symptoms were consistent with ischemic colitis.  She was treated with IV antibiotics.  Symptoms have subsequently been improving.  Hemoglobin has remained stable.  Rectal bleeding is improving.  GI has cleared the patient for discharge home today on oral Augmentin.  Pt states today all meds were sent but NO augmentin sent to finish antibx course.  Pt denies fever, wt loss, night sweats, loss of appetite, or other constitutional symptoms  Denies worsening reflux, abd pain, dysphagia, n/v, bowel change or blood.  BP were held while hospd and now back on all meds except lisinopril. Pt also holding ASA 325 mg for total 2 wks as well.  Did have AKI on CKD and due for lab f/u as well.   Has not yet started Victoza and wondering if would be a good idea or not now.   Pt denies polydipsia, polyuria, or new focal neuro s/s.      Wt Readings from Last 3 Encounters:  10/18/20 181 lb (82.1 kg)  10/12/20 180 lb (81.6 kg)  09/10/20 190 lb (86.2 kg)   BP Readings from Last 3 Encounters:  10/18/20 (!) 168/78  10/16/20 (!) 165/78  09/11/20 135/88         Past Medical History:  Diagnosis Date   Abdominal pain, epigastric 12/20/2006   ALLERGIC RHINITIS 12/25/2006   Allergy    ANXIETY 10/16/2006   no per pt   Arthritis    right shoulder   BACK PAIN 06/15/2008   Blood transfusion without reported diagnosis    BRADYCARDIA 05/04/2008   C. difficile diarrhea    Carpal tunnel syndrome 10/16/2006   Cellulitis and abscess of other specified site 06/15/2008   CHEST  PAIN 09/08/2009   Chronic low back pain    CIRRHOSIS 10/16/2006   CKD (chronic kidney disease)    2008- had Ecoli and caused renal failure, no problems since then   CKD (chronic kidney disease) stage 3, GFR 30-59 ml/min ([place]) 10/16/2006   Qualifier: Diagnosis of  By: Jenny Reichmann MD, [place] 67/61/9509   Complication of anesthesia    awake during 2 surgeries   CONTUSION, LOWER LEG 09/15/2008   DEPRESSION 10/16/2006   no per pt   Diabetes mellitus without complication ([place])    Esophageal reflux 12/25/2006   HYPERLIPIDEMIA 12/25/2006   Hyperlipidemia    HYPERTENSION 08/15/2006   Hypertension    HYPOTHYROIDISM 08/15/2006   Kidney failure, acute ([place]) 2009   as a result of severe E Coli infection   Left-sided carotid artery disease ([place])    MIGRAINE HEADACHE 10/16/2006   Obesity 10/16/2006   OVARIAN CYST 12/20/2006   Psoriasis 01/03/2011   SHINGLES, HX OF 12/20/2006   Stroke ([place])    3 strokes 2008-no deficits, only on ASA   TRANSIENT ISCHEMIC ATTACKS, HX OF 12/20/2006   VERTIGO 09/15/2007   VITAMIN B12 DEFICIENCY 01/09/2007   Past Surgical History:  Procedure Laterality Date  ABDOMINAL HYSTERECTOMY  02/2006   ANTERIOR CERVICAL DECOMP/DISCECTOMY FUSION  2005   CESAREAN SECTION     CHOLECYSTECTOMY     COLONOSCOPY     ENDARTERECTOMY Left 08/25/2014   Procedure: LEFT CAROTID ENDARTERECTOMY WITH HEMASHIELD PATCH ANGIOPLASTY;  Surgeon: Elam Dutch, MD;  Location: [facility];  Service: Vascular;  Laterality: Left;   NECK SURGERY  2005   ROTATOR CUFF REPAIR Right 02/2014   Dr Veverly Fells    reports that she has quit smoking. She has never used smokeless tobacco. She reports that she does not drink alcohol and does not use drugs. family history includes Diabetes in her father, sister, and another family member; Heart disease in her brother, brother, father, and mother; Hypertension in her brother, brother and another family member; Lupus in her mother; Raynaud syndrome in her mother;  Stroke in her father. Allergies  Allergen Reactions   Bactrim [Sulfamethoxazole-Trimethoprim] Nausea And Vomiting   Cefuroxime Axetil Swelling     edema/swelling   Ciprofloxacin Swelling   Doxycycline Nausea Only   Levaquin [Levofloxacin In D5w] Other (See Comments)    GI upset   Lipitor [Atorvastatin] Other (See Comments)    Myalgia    Lyrica [Pregabalin] Other (See Comments)    HA, confusion, swelling   Macrodantin [Nitrofurantoin Macrocrystal] Other (See Comments)    GI upset, diarrhea and sob   Phentermine Other (See Comments)    All the side effects listed on the paper   Gabapentin Rash   Current Outpatient Medications on File Prior to Visit  Medication Sig Dispense Refill   acetaminophen (TYLENOL) 500 MG tablet Take 1,000 mg by mouth every 8 (eight) hours as needed (pain).     amLODipine (NORVASC) 10 MG tablet Take 1 tablet (10 mg total) by mouth daily. 90 tablet 3   Ascorbic Acid (VITAMIN C) 1000 MG tablet Take 1,000 mg by mouth daily.     Cholecalciferol (VITAMIN D3) 1000 units CAPS Take 2 capsules (2,000 Units total) by mouth daily. 60 capsule 0   clobetasol (TEMOVATE) 0.05 % external solution Apply 1 application topically daily as needed (scalp psoriasis).   0   diclofenac sodium (VOLTAREN) 1 % GEL Apply 4 g topically 4 (four) times daily as needed. (Patient taking differently: Apply 4 g topically 4 (four) times daily as needed (pain).) 400 g 11   dicyclomine (BENTYL) 20 MG tablet Take 0.5 tablets (10 mg total) by mouth 3 (three) times daily as needed for spasms. 30 tablet 0   estradiol (ESTRACE) 0.1 MG/GM vaginal cream Place 1 Applicatorful vaginally daily as needed (yeast infection).     hydrALAZINE (APRESOLINE) 100 MG tablet Take 1 tablet (100 mg total) by mouth 3 (three) times daily. 90 tablet 11   ID NOW COVID-19 KIT See admin instructions.     Insulin Pen Needle (NOVOFINE PEN NEEDLE) 32G X 6 MM MISC Use as directed once weekly E11.9 12 each 3   levocetirizine (XYZAL)  5 MG tablet Take 5 mg by mouth every evening.     levothyroxine (SYNTHROID) 75 MCG tablet TAKE ONE TABLET BY MOUTH DAILY (Patient taking differently: Take 75 mcg by mouth daily before breakfast.) 90 tablet 3   lovastatin (MEVACOR) 40 MG tablet Take 2 tablets (80 mg total) by mouth at bedtime. 180 tablet 3   ondansetron (ZOFRAN) 4 MG tablet Take 1 tablet (4 mg total) by mouth every 6 (six) hours as needed for nausea. 20 tablet 0   oxyCODONE (OXY IR/ROXICODONE) 5 MG immediate release  tablet Take 1 tablet (5 mg total) by mouth every 4 (four) hours as needed for moderate pain. 20 tablet 0   pantoprazole (PROTONIX) 40 MG tablet TAKE ONE TABLET BY MOUTH DAILY (Patient taking differently: Take 40 mg by mouth daily.) 90 tablet 3   SUMAtriptan (IMITREX) 100 MG tablet TAKE ONE TABLET BY MOUTH AT ONSET OF MIGRAINE OR HEADACHE; MAY REPEAT ONE TABLET IN 2 HOURS IF NEEDED. (Patient taking differently: Take 100 mg by mouth daily as needed for migraine. TAKE ONE TABLET BY MOUTH AT ONSET OF MIGRAINE OR HEADACHE; MAY REPEAT ONE TABLET IN 2 HOURS IF NEEDED.) 10 tablet 2   triamcinolone cream (KENALOG) 0.1 % Apply 1 application topically daily.     TURMERIC CURCUMIN PO Take 1 tablet by mouth daily.     aspirin EC 81 MG tablet Take 81 mg by mouth daily. Swallow whole.     No current facility-administered medications on file prior to visit.        ROS:  All others reviewed and negative.  Objective        PE:  BP (!) 168/78 (BP Location: Right Arm, Patient Position: Sitting, Cuff Size: Large)   Pulse 63   Temp 99 F (37.2 C) (Oral)   Ht 5' 2" (1.575 m)   Wt 181 lb (82.1 kg)   SpO2 99%   BMI 33.11 kg/m                 Constitutional: Pt appears in NAD               HENT: Head: NCAT.                Right Ear: External ear normal.                 Left Ear: External ear normal.                Eyes: . Pupils are equal, round, and reactive to light. Conjunctivae and EOM are normal               Nose: without d/c  or deformity               Neck: Neck supple. Gross normal ROM               Cardiovascular: Normal rate and regular rhythm.                 Pulmonary/Chest: Effort normal and breath sounds without rales or wheezing.                Abd:  Soft, NT, ND, + BS, no organomegaly               Neurological: Pt is alert. At baseline orientation, motor grossly intact               Skin: Skin is warm. No rashes, no other new lesions, LE edema - none               Psychiatric: Pt behavior is normal without agitation   Micro: none  Cardiac tracings I have personally interpreted today:  none  Pertinent Radiological findings (summarize): none   Lab Results  Component Value Date   WBC 7.1 10/18/2020   HGB 12.3 10/18/2020   HCT 35.9 (L) 10/18/2020   PLT 215.0 10/18/2020   GLUCOSE 91 10/18/2020   CHOL 139 10/18/2020   TRIG 199.0 (H) 10/18/2020   HDL 33.00 (L) 10/18/2020  LDLDIRECT 140.0 08/05/2018   LDLCALC 67 10/18/2020   ALT 10 10/18/2020   AST 13 10/18/2020   NA 142 10/18/2020   K 4.1 10/18/2020   CL 109 10/18/2020   CREATININE 1.69 (H) 10/18/2020   BUN 22 10/18/2020   CO2 23 10/18/2020   TSH 2.79 05/10/2020   INR 0.99 08/17/2014   HGBA1C 5.6 10/18/2020   MICROALBUR 3.3 (H) 05/01/2019   Assessment/Plan:  Heather Dawson is a 65 y.o. White or Caucasian [1] female with  has a past medical history of Abdominal pain, epigastric (12/20/2006), ALLERGIC RHINITIS (12/25/2006), Allergy, ANXIETY (10/16/2006), Arthritis, BACK PAIN (06/15/2008), Blood transfusion without reported diagnosis, BRADYCARDIA (05/04/2008), C. difficile diarrhea, Carpal tunnel syndrome (10/16/2006), Cellulitis and abscess of other specified site (06/15/2008), CHEST PAIN (09/08/2009), Chronic low back pain, CIRRHOSIS (10/16/2006), CKD (chronic kidney disease), CKD (chronic kidney disease) stage 3, GFR 30-59 ml/min (HCC) (10/16/2006), COMMON MIGRAINE (99/35/7017), Complication of anesthesia, CONTUSION, LOWER LEG (09/15/2008), DEPRESSION  (10/16/2006), Diabetes mellitus without complication ([place]), Esophageal reflux (12/25/2006), HYPERLIPIDEMIA (12/25/2006), Hyperlipidemia, HYPERTENSION (08/15/2006), Hypertension, HYPOTHYROIDISM (08/15/2006), Kidney failure, acute ([place]) (2009), Left-sided carotid artery disease ([place]), MIGRAINE HEADACHE (10/16/2006), Obesity (10/16/2006), OVARIAN CYST (12/20/2006), Psoriasis (01/03/2011), SHINGLES, HX OF (12/20/2006), Stroke ([place]), TRANSIENT ISCHEMIC ATTACKS, HX OF (12/20/2006), VERTIGO (09/15/2007), and VITAMIN B12 DEFICIENCY (01/09/2007).  Morbid obesity ([place]) Ok to [employer] start for now given recent GI illness in case of GI side effect from [place] hypertension BP Readings from Last 3 Encounters:  10/18/20 (!) 168/78  10/16/20 (!) 165/78  09/11/20 135/88   Uncontrolled, pt to restart medical treatment lisinopril pending adequate renal fxn on testing today   Diabetes Lab Results  Component Value Date   HGBA1C 5.6 10/18/2020   Stable, pt to continue current medical treatment - diet  Colitis C/w ischemic colitis most likely, cont to hold asa 325 for total 2 wks, also gave rx today for last 3 days augmentin that for some reason did not make it to the pharmacy  CKD (chronic kidney disease) stage 4, GFR 15-29 ml/min (HCC) For f/u lab today, and f/u renal as planned  Followup: Return in about 6 months (around 04/18/2021).  [name], MD 10/23/2020 12:03 PM [facility]

## 2020-10-22 ENCOUNTER — Other Ambulatory Visit: Payer: Self-pay | Admitting: Internal Medicine

## 2020-10-22 NOTE — Telephone Encounter (Signed)
Please refill as per office routine med refill policy (all routine meds to be refilled for 3 mo or monthly (per pt preference) up to one year from last visit, then month to month grace period for 3 mo, then further med refills will have to be denied) ? ?

## 2020-10-23 ENCOUNTER — Encounter: Payer: Self-pay | Admitting: Internal Medicine

## 2020-10-23 NOTE — Assessment & Plan Note (Signed)
Ok to [employer] victoza start for now given recent GI illness in case of GI side effect from victoza

## 2020-10-23 NOTE — Assessment & Plan Note (Signed)
BP Readings from Last 3 Encounters:  10/18/20 (!) 168/78  10/16/20 (!) 165/78  09/11/20 135/88   Uncontrolled, pt to restart medical treatment lisinopril pending adequate renal fxn on testing today

## 2020-10-23 NOTE — Assessment & Plan Note (Signed)
C/w ischemic colitis most likely, cont to hold asa 325 for total 2 wks, also gave rx today for last 3 days augmentin that for some reason did not make it to the pharmacy

## 2020-10-23 NOTE — Assessment & Plan Note (Signed)
For f/u lab today, and f/u renal as planned

## 2020-10-23 NOTE — Assessment & Plan Note (Signed)
Lab Results  ?Component Value Date  ? HGBA1C 5.6 10/18/2020  ? ?Stable, pt to continue current medical treatment  - diet ? ?

## 2020-11-01 DIAGNOSIS — E785 Hyperlipidemia, unspecified: Secondary | ICD-10-CM | POA: Diagnosis not present

## 2020-11-01 DIAGNOSIS — N179 Acute kidney failure, unspecified: Secondary | ICD-10-CM | POA: Diagnosis not present

## 2020-11-01 DIAGNOSIS — I129 Hypertensive chronic kidney disease with stage 1 through stage 4 chronic kidney disease, or unspecified chronic kidney disease: Secondary | ICD-10-CM | POA: Diagnosis not present

## 2020-11-01 DIAGNOSIS — N183 Chronic kidney disease, stage 3 unspecified: Secondary | ICD-10-CM | POA: Diagnosis not present

## 2020-11-09 ENCOUNTER — Ambulatory Visit: Payer: Medicare Other | Admitting: Internal Medicine

## 2020-11-16 ENCOUNTER — Ambulatory Visit: Payer: Medicare Other | Admitting: Internal Medicine

## 2020-11-24 DIAGNOSIS — N183 Chronic kidney disease, stage 3 unspecified: Secondary | ICD-10-CM | POA: Diagnosis not present

## 2020-11-30 ENCOUNTER — Ambulatory Visit: Payer: Medicare Other | Admitting: Internal Medicine

## 2020-11-30 ENCOUNTER — Encounter: Payer: Self-pay | Admitting: Internal Medicine

## 2020-11-30 VITALS — BP 120/70 | HR 65 | Ht 62.0 in | Wt 184.6 lb

## 2020-11-30 DIAGNOSIS — K559 Vascular disorder of intestine, unspecified: Secondary | ICD-10-CM | POA: Diagnosis not present

## 2020-11-30 NOTE — Progress Notes (Signed)
HISTORY OF PRESENT ILLNESS:  Heather Dawson is a 65 y.o. female with a history of C. difficile associated diarrhea, GERD, and adenomatous colon polyps.  Patient presents today after being hospitalized August 24 through October 16, 2020 with acute segmental colitis.  She presented with bloody diarrhea and significant abdominal pain.  Enteric pathogens were negative.  She was felt to have ischemic colitis.  Symptoms resolved with time.  She was set up for this follow-up.  She is accompanied by her husband.  She is pleased to report that she has had no further problems since her hospital discharge.  No abdominal pain.  Bowel habits are back to baseline.  She is up-to-date on her surveillance colonoscopy which was last performed October 2020.  At that time she was found to have a diminutive adenoma.  Follow-up in 7 years recommended.  Her only other complaint is bloating and gas.  She inquires about the usefulness of a probiotic trial  REVIEW OF SYSTEMS:  All non-GI ROS negative unless otherwise stated in the HPI.  Past Medical History:  Diagnosis Date   Abdominal pain, epigastric 12/20/2006   ALLERGIC RHINITIS 12/25/2006   Allergy    ANXIETY 10/16/2006   no per pt   Arthritis    right shoulder   BACK PAIN 06/15/2008   Blood transfusion without reported diagnosis    BRADYCARDIA 05/04/2008   C. difficile diarrhea    Carpal tunnel syndrome 10/16/2006   Cellulitis and abscess of other specified site 06/15/2008   CHEST PAIN 09/08/2009   Chronic low back pain    CIRRHOSIS 10/16/2006   CKD (chronic kidney disease)    2008- had Ecoli and caused renal failure, no problems since then   CKD (chronic kidney disease) stage 3, GFR 30-59 ml/min ([place]) 10/16/2006   Qualifier: Diagnosis of  By: Jenny Reichmann MD, [place] 26/20/3559   Complication of anesthesia    awake during 2 surgeries   CONTUSION, LOWER LEG 09/15/2008   DEPRESSION 10/16/2006   no per pt   Diabetes mellitus without complication ([place])     Esophageal reflux 12/25/2006   HYPERLIPIDEMIA 12/25/2006   Hyperlipidemia    HYPERTENSION 08/15/2006   Hypertension    HYPOTHYROIDISM 08/15/2006   Kidney failure, acute ([place]) 2009   as a result of severe E Coli infection   Left-sided carotid artery disease ([place])    MIGRAINE HEADACHE 10/16/2006   Obesity 10/16/2006   OVARIAN CYST 12/20/2006   Psoriasis 01/03/2011   SHINGLES, HX OF 12/20/2006   Stroke ([place])    3 strokes 2008-no deficits, only on ASA   TRANSIENT ISCHEMIC ATTACKS, HX OF 12/20/2006   VERTIGO 09/15/2007   VITAMIN B12 DEFICIENCY 01/09/2007    Past Surgical History:  Procedure Laterality Date   ABDOMINAL HYSTERECTOMY  02/2006   ANTERIOR CERVICAL DECOMP/DISCECTOMY FUSION  2005   CESAREAN SECTION     CHOLECYSTECTOMY     COLONOSCOPY     ENDARTERECTOMY Left 08/25/2014   Procedure: LEFT CAROTID ENDARTERECTOMY WITH HEMASHIELD PATCH ANGIOPLASTY;  Surgeon: Elam Dutch, MD;  Location: [facility];  Service: Vascular;  Laterality: Left;   NECK SURGERY  2005   ROTATOR CUFF REPAIR Right 02/2014   Dr Veverly Fells    Social History Michae Kava Smotherman  reports that she has quit smoking. She has never used smokeless tobacco. She reports that she does not drink alcohol and does not use drugs.  family history includes Diabetes in her father, sister, and another family  member; Heart disease in her brother, brother, father, and mother; Hypertension in her brother, brother and another family member; Lupus in her mother; Raynaud syndrome in her mother; Stroke in her father.  Allergies  Allergen Reactions   Bactrim [Sulfamethoxazole-Trimethoprim] Nausea And Vomiting   Cefuroxime Axetil Swelling     edema/swelling   Ciprofloxacin Swelling   Doxycycline Nausea Only   Levaquin [Levofloxacin In D5w] Other (See Comments)    GI upset   Lipitor [Atorvastatin] Other (See Comments)    Myalgia    Lyrica [Pregabalin] Other (See Comments)    HA, confusion, swelling   Macrodantin [Nitrofurantoin Macrocrystal]  Other (See Comments)    GI upset, diarrhea and sob   Phentermine Other (See Comments)    All the side effects listed on the paper   Gabapentin Rash       PHYSICAL EXAMINATION: Vital signs: BP 120/70   Pulse 65   Ht 5\' 2"  (1.575 m)   Wt 184 lb 9.6 oz (83.7 kg)   BMI 33.76 kg/m   Constitutional: generally well-appearing, no acute distress Psychiatric: alert and oriented x3, cooperative Eyes: extraocular movements intact, anicteric, conjunctiva pink Mouth: oral pharynx moist, no lesions Neck: supple no lymphadenopathy Cardiovascular: heart regular rate and rhythm, no murmur Lungs: clear to auscultation bilaterally Abdomen: soft, nontender, nondistended, no obvious ascites, no peritoneal signs, normal bowel sounds, no organomegaly Rectal: Omitted Extremities: no clubbing, cyanosis, or lower extremity edema bilaterally Skin: no lesions on visible extremities Neuro: No focal deficits.  Cranial nerves intact  ASSESSMENT:  1.  Acute ischemic colitis.  Resolved 2.  Adenomatous colon polyps.  Surveillance up-to-date 3.  GERD.  Asymptomatic on pantoprazole   PLAN:  1.  Discussion on ischemic colitis 2.  Reflux precautions 3.  Continue PPI 4.  Surveillance colonoscopy around October 2027 5.  Interval follow-up as needed

## 2020-11-30 NOTE — Patient Instructions (Signed)
If you are age 64 or older, your body mass index should be between 23-30. Your Body mass index is 33.76 kg/m. If this is out of the aforementioned range listed, please consider follow up with your Primary Care Provider.  If you are age 33 or younger, your body mass index should be between 19-25. Your Body mass index is 33.76 kg/m. If this is out of the aformentioned range listed, please consider follow up with your Primary Care Provider.   __________________________________________________________  The [facility] providers would like to encourage you to use [facility] to communicate with providers for non-urgent requests or questions.  Due to long hold times on the telephone, sending your provider a message by [facility] may be a faster and more efficient way to get a response.  Please allow 48 business hours for a response.  Please remember that this is for non-urgent requests.    Follow up with Dr Henrene Pastor as needed.

## 2020-12-27 ENCOUNTER — Other Ambulatory Visit: Payer: Self-pay

## 2020-12-27 ENCOUNTER — Encounter: Payer: Self-pay | Admitting: Internal Medicine

## 2020-12-27 ENCOUNTER — Ambulatory Visit (INDEPENDENT_AMBULATORY_CARE_PROVIDER_SITE_OTHER): Payer: Medicare Other | Admitting: Internal Medicine

## 2020-12-27 VITALS — BP 130/72 | HR 67 | Temp 98.2°F | Ht 62.0 in | Wt 184.0 lb

## 2020-12-27 DIAGNOSIS — K219 Gastro-esophageal reflux disease without esophagitis: Secondary | ICD-10-CM

## 2020-12-27 DIAGNOSIS — F411 Generalized anxiety disorder: Secondary | ICD-10-CM

## 2020-12-27 DIAGNOSIS — R197 Diarrhea, unspecified: Secondary | ICD-10-CM | POA: Diagnosis not present

## 2020-12-27 DIAGNOSIS — R1013 Epigastric pain: Secondary | ICD-10-CM | POA: Diagnosis not present

## 2020-12-27 DIAGNOSIS — Z23 Encounter for immunization: Secondary | ICD-10-CM

## 2020-12-27 MED ORDER — SUCRALFATE 1 G PO TABS: 1.0000 g | ORAL_TABLET | Freq: Three times a day (TID) | ORAL | 0 refills | Status: DC

## 2020-12-27 NOTE — Patient Instructions (Signed)
Please take all new medication as prescribed - the carafate for 1 month  Please call in even 1-2 weeks if not improving, for referral back to Dr Henrene Pastor  Please continue all other medications as before, and refills have been done if requested.  Please have the pharmacy call with any other refills you may need.  Please continue your efforts at being more active, low cholesterol diet, and weight control.  Please keep your appointments with your specialists as you may have planned

## 2020-12-27 NOTE — Assessment & Plan Note (Signed)
With mild breakthrough recent - to continue PPI, tums prn, anti-reflux precautions

## 2020-12-27 NOTE — Assessment & Plan Note (Signed)
With recent at least mild to mod situational worsening, for now to cont SSRI as is, declines change in tx or referral for counseling
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
## 2020-12-27 NOTE — Assessment & Plan Note (Signed)
With episode x 1 of large diarrhea stool and incontinence last wk, cant completely r/o recurrent type of colitis, but will hold on further testing for now given above and no further diarrhea the past wk

## 2020-12-27 NOTE — Assessment & Plan Note (Signed)
I suspect c/w gastritis - for carafate qid, will hold on imaging or lab at this time but would consider this and/or GI referral for persistence or worsening.

## 2021-01-08 ENCOUNTER — Encounter: Payer: Self-pay | Admitting: Internal Medicine

## 2021-01-08 DIAGNOSIS — R197 Diarrhea, unspecified: Secondary | ICD-10-CM

## 2021-01-08 DIAGNOSIS — K921 Melena: Secondary | ICD-10-CM

## 2021-01-10 ENCOUNTER — Encounter: Payer: Self-pay | Admitting: Internal Medicine

## 2021-01-10 ENCOUNTER — Other Ambulatory Visit (INDEPENDENT_AMBULATORY_CARE_PROVIDER_SITE_OTHER): Payer: Medicare Other

## 2021-01-10 DIAGNOSIS — K921 Melena: Secondary | ICD-10-CM

## 2021-01-10 DIAGNOSIS — R197 Diarrhea, unspecified: Secondary | ICD-10-CM | POA: Diagnosis not present

## 2021-01-10 LAB — CBC WITH DIFFERENTIAL/PLATELET
Basophils Absolute: 0 10*3/uL (ref 0.0–0.1)
Basophils Relative: 0.6 % (ref 0.0–3.0)
Eosinophils Absolute: 0.2 10*3/uL (ref 0.0–0.7)
Eosinophils Relative: 2.6 % (ref 0.0–5.0)
HCT: 36 % (ref 36.0–46.0)
Hemoglobin: 12 g/dL (ref 12.0–15.0)
Lymphocytes Relative: 17.6 % (ref 12.0–46.0)
Lymphs Abs: 1.2 10*3/uL (ref 0.7–4.0)
MCHC: 33.3 g/dL (ref 30.0–36.0)
MCV: 94.8 fl (ref 78.0–100.0)
Monocytes Absolute: 0.5 10*3/uL (ref 0.1–1.0)
Monocytes Relative: 7 % (ref 3.0–12.0)
Neutro Abs: 4.9 10*3/uL (ref 1.4–7.7)
Neutrophils Relative %: 72.2 % (ref 43.0–77.0)
Platelets: 212 10*3/uL (ref 150.0–400.0)
RBC: 3.8 Mil/uL — ABNORMAL LOW (ref 3.87–5.11)
RDW: 13 % (ref 11.5–15.5)
WBC: 6.8 10*3/uL (ref 4.0–10.5)

## 2021-01-10 LAB — BASIC METABOLIC PANEL
BUN: 39 mg/dL — ABNORMAL HIGH (ref 6–23)
CO2: 20 mEq/L (ref 19–32)
Calcium: 9.2 mg/dL (ref 8.4–10.5)
Chloride: 108 mEq/L (ref 96–112)
Creatinine, Ser: 2.19 mg/dL — ABNORMAL HIGH (ref 0.40–1.20)
GFR: 23.3 mL/min — ABNORMAL LOW (ref >60.00)
Glucose, Bld: 127 mg/dL — ABNORMAL HIGH (ref 70–99)
Potassium: 4.4 mEq/L (ref 3.5–5.1)
Sodium: 138 mEq/L (ref 135–145)

## 2021-01-10 NOTE — Addendum Note (Signed)
Addended by: Octavio Manns E on: 01/10/2021 08:57 AM   Modules accepted: Orders

## 2021-01-15 ENCOUNTER — Encounter: Payer: Self-pay | Admitting: Internal Medicine

## 2021-01-15 DIAGNOSIS — K529 Noninfective gastroenteritis and colitis, unspecified: Secondary | ICD-10-CM

## 2021-01-15 DIAGNOSIS — R197 Diarrhea, unspecified: Secondary | ICD-10-CM

## 2021-01-16 ENCOUNTER — Other Ambulatory Visit: Payer: Medicare Other

## 2021-01-16 DIAGNOSIS — R197 Diarrhea, unspecified: Secondary | ICD-10-CM | POA: Diagnosis not present

## 2021-01-16 DIAGNOSIS — K921 Melena: Secondary | ICD-10-CM

## 2021-01-17 ENCOUNTER — Encounter: Payer: Self-pay | Admitting: Internal Medicine

## 2021-01-17 LAB — CLOSTRIDIUM DIFFICILE BY PCR: Toxigenic C. Difficile by PCR: NEGATIVE

## 2021-01-22 ENCOUNTER — Other Ambulatory Visit: Payer: Self-pay | Admitting: Internal Medicine

## 2021-02-23 ENCOUNTER — Ambulatory Visit: Payer: Medicare Other | Admitting: Podiatry

## 2021-02-23 ENCOUNTER — Ambulatory Visit (INDEPENDENT_AMBULATORY_CARE_PROVIDER_SITE_OTHER): Payer: Medicare Other

## 2021-02-23 ENCOUNTER — Other Ambulatory Visit: Payer: Self-pay

## 2021-02-23 ENCOUNTER — Encounter: Payer: Self-pay | Admitting: Podiatry

## 2021-02-23 DIAGNOSIS — M7751 Other enthesopathy of right foot: Secondary | ICD-10-CM | POA: Diagnosis not present

## 2021-02-23 DIAGNOSIS — M779 Enthesopathy, unspecified: Secondary | ICD-10-CM

## 2021-02-23 MED ORDER — TRIAMCINOLONE ACETONIDE 10 MG/ML IJ SUSP
10.0000 mg | Freq: Once | INTRAMUSCULAR | Status: AC
  Administered 2021-02-23: 10 mg

## 2021-02-23 NOTE — Progress Notes (Signed)
Subjective:   Patient ID: Heather Dawson, female   DOB: 66 y.o.   MRN: [id number]   HPI Patient presents stating she is getting a lot of pain in her right ankle and it started more recently and is making it hard for her to walk.  States bunion structure is doing well from previous surgery and does have moderate bunion deformity on the left foot.  Patient does not smoke and tries to be active   Review of Systems  All other systems reviewed and are negative.      Objective:  Physical Exam Vitals and nursing note reviewed.  Constitutional:      Appearance: She is well-developed.  Pulmonary:     Effort: Pulmonary effort is normal.  Musculoskeletal:        General: Normal range of motion.  Skin:    General: Skin is warm.  Neurological:     Mental Status: She is alert.    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  Discussed bunion do not recommend surgery at this time unless it were to worsen  X-rays indicate well corrected bunion deformity right fixation in place with no indications of arthritis subtalar ankle joint right

## 2021-02-28 DIAGNOSIS — E785 Hyperlipidemia, unspecified: Secondary | ICD-10-CM | POA: Diagnosis not present

## 2021-02-28 DIAGNOSIS — I129 Hypertensive chronic kidney disease with stage 1 through stage 4 chronic kidney disease, or unspecified chronic kidney disease: Secondary | ICD-10-CM | POA: Diagnosis not present

## 2021-02-28 DIAGNOSIS — N179 Acute kidney failure, unspecified: Secondary | ICD-10-CM | POA: Diagnosis not present

## 2021-02-28 DIAGNOSIS — N183 Chronic kidney disease, stage 3 unspecified: Secondary | ICD-10-CM | POA: Diagnosis not present

## 2021-03-02 ENCOUNTER — Telehealth: Payer: Self-pay | Admitting: Internal Medicine

## 2021-03-02 NOTE — Telephone Encounter (Signed)
N/A unable to leave a message for patient to call me back at [phone] to schedule Medicare Annual Wellness Visit   Last AWV  11/13/19  Please schedule at anytime with [facility] if patient calls the office back.    40 Minutes appointment   Any questions, please call me at [phone]

## 2021-03-07 ENCOUNTER — Other Ambulatory Visit: Payer: Self-pay

## 2021-03-07 ENCOUNTER — Ambulatory Visit (INDEPENDENT_AMBULATORY_CARE_PROVIDER_SITE_OTHER): Payer: Medicare Other

## 2021-03-07 VITALS — BP 118/70 | HR 57 | Temp 98.1°F | Ht 62.0 in | Wt 182.8 lb

## 2021-03-07 DIAGNOSIS — Z0000 Encounter for general adult medical examination without abnormal findings: Secondary | ICD-10-CM

## 2021-03-07 NOTE — Patient Instructions (Signed)
Heather Dawson , Thank you for taking time to come for your Medicare Wellness Visit. I appreciate your ongoing commitment to your health goals. Please review the following plan we discussed and let me know if I can assist you in the future.   Screening recommendations/referrals: Colonoscopy: 12/16/2018; due every 5 years Mammogram: 03/22/2020; due every 1-2 years Bone Density: never done Recommended yearly ophthalmology/optometry visit for glaucoma screening and checkup Recommended yearly dental visit for hygiene and checkup  Vaccinations: Influenza vaccine: 12/27/2020 Pneumococcal vaccine: 05/03/2016, 04/23/2017 (due 04/24/2022) Tdap vaccine: 08/30/2009; due every 10 years (overdue) Shingles vaccine: never done  Covid-19: 07/03/2019, 07/24/2019, 02/04/2020  Advanced directives: Advance directive discussed with you today. Even though you declined this today please call our office should you change your mind and we can give you the proper paperwork for you to fill out.  Conditions/risks identified: Yes; Client understands the importance of follow-up with providers by attending scheduled visits and discussed goals to eat healthier, increase physical activity, exercise the brain, socialize more, get enough sleep and make time for laughter.  Next appointment: Please schedule your next Medicare Wellness Visit with your Nurse Health Advisor in 1 year by calling [phone].  Preventive Care 40-64 Years, Female Preventive care refers to lifestyle choices and visits with your health care provider that can promote health and wellness. What does preventive care include? A yearly physical exam. This is also called an annual well check. Dental exams once or twice a year. Routine eye exams. Ask your health care provider how often you should have your eyes checked. Personal lifestyle choices, including: Daily care of your teeth and gums. Regular physical activity. Eating a healthy diet. Avoiding tobacco and drug  use. Limiting alcohol use. Practicing safe sex. Taking low-dose aspirin daily starting at age 12. Taking vitamin and mineral supplements as recommended by your health care provider. What happens during an annual well check? The services and screenings done by your health care provider during your annual well check will depend on your age, overall health, lifestyle risk factors, and family history of disease. Counseling  Your health care provider may ask you questions about your: Alcohol use. Tobacco use. Drug use. Emotional well-being. Home and relationship well-being. Sexual activity. Eating habits. Work and work Statistician. Method of birth control. Menstrual cycle. Pregnancy history. Screening  You may have the following tests or measurements: Height, weight, and BMI. Blood pressure. Lipid and cholesterol levels. These may be checked every 5 years, or more frequently if you are over 9 years old. Skin check. Lung cancer screening. You may have this screening every year starting at age 64 if you have a 30-pack-year history of smoking and currently smoke or have quit within the past 15 years. Fecal occult blood test (FOBT) of the stool. You may have this test every year starting at age 98. Flexible sigmoidoscopy or colonoscopy. You may have a sigmoidoscopy every 5 years or a colonoscopy every 10 years starting at age 31. Hepatitis C blood test. Hepatitis B blood test. Sexually transmitted disease (STD) testing. Diabetes screening. This is done by checking your blood sugar (glucose) after you have not eaten for a while (fasting). You may have this done every 1-3 years. Mammogram. This may be done every 1-2 years. Talk to your health care provider about when you should start having regular mammograms. This may depend on whether you have a family history of breast cancer. BRCA-related cancer screening. This may be done if you have a family history of breast,  ovarian, tubal, or  peritoneal cancers. Pelvic exam and Pap test. This may be done every 3 years starting at age 73. Starting at age 38, this may be done every 5 years if you have a Pap test in combination with an HPV test. Bone density scan. This is done to screen for osteoporosis. You may have this scan if you are at high risk for osteoporosis. Discuss your test results, treatment options, and if necessary, the need for more tests with your health care provider. Vaccines  Your health care provider may recommend certain vaccines, such as: Influenza vaccine. This is recommended every year. Tetanus, diphtheria, and acellular pertussis (Tdap, Td) vaccine. You may need a Td booster every 10 years. Zoster vaccine. You may need this after age 66. Pneumococcal 13-valent conjugate (PCV13) vaccine. You may need this if you have certain conditions and were not previously vaccinated. Pneumococcal polysaccharide (PPSV23) vaccine. You may need one or two doses if you smoke cigarettes or if you have certain conditions. Talk to your health care provider about which screenings and vaccines you need and how often you need them. This information is not intended to replace advice given to you by your health care provider. Make sure you discuss any questions you have with your health care provider. Document Released: 03/04/2015 Document Revised: 10/26/2015 Document Reviewed: 12/07/2014 Elsevier Interactive Patient Education  2017 [employer] Prevention in the Home Falls can cause injuries. They can happen to people of all ages. There are many things you can do to make your home safe and to help prevent falls. What can I do on the outside of my home? Regularly fix the edges of walkways and driveways and fix any cracks. Remove anything that might make you trip as you walk through a door, such as a raised step or threshold. Trim any bushes or trees on the path to your home. Use bright outdoor lighting. Clear any walking  paths of anything that might make someone trip, such as rocks or tools. Regularly check to see if handrails are loose or broken. Make sure that both sides of any steps have handrails. Any raised decks and porches should have guardrails on the edges. Have any leaves, snow, or ice cleared regularly. Use sand or salt on walking paths during winter. Clean up any spills in your garage right away. This includes oil or grease spills. What can I do in the bathroom? Use night lights. Install grab bars by the toilet and in the tub and shower. Do not use towel bars as grab bars. Use non-skid mats or decals in the tub or shower. If you need to sit down in the shower, use a plastic, non-slip stool. Keep the floor dry. Clean up any water that spills on the floor as soon as it happens. Remove soap buildup in the tub or shower regularly. Attach bath mats securely with double-sided non-slip rug tape. Do not have throw rugs and other things on the floor that can make you trip. What can I do in the bedroom? Use night lights. Make sure that you have a light by your bed that is easy to reach. Do not use any sheets or blankets that are too big for your bed. They should not hang down onto the floor. Have a firm chair that has side arms. You can use this for support while you get dressed. Do not have throw rugs and other things on the floor that can make you trip. What can  I do in the kitchen? Clean up any spills right away. Avoid walking on wet floors. Keep items that you use a lot in easy-to-reach places. If you need to reach something above you, use a strong step stool that has a grab bar. Keep electrical cords out of the way. Do not use floor polish or wax that makes floors slippery. If you must use wax, use non-skid floor wax. Do not have throw rugs and other things on the floor that can make you trip. What can I do with my stairs? Do not leave any items on the stairs. Make sure that there are handrails  on both sides of the stairs and use them. Fix handrails that are broken or loose. Make sure that handrails are as long as the stairways. Check any carpeting to make sure that it is firmly attached to the stairs. Fix any carpet that is loose or worn. Avoid having throw rugs at the top or bottom of the stairs. If you do have throw rugs, attach them to the floor with carpet tape. Make sure that you have a light switch at the top of the stairs and the bottom of the stairs. If you do not have them, ask someone to add them for you. What else can I do to help prevent falls? Wear shoes that: Do not have high heels. Have rubber bottoms. Are comfortable and fit you well. Are closed at the toe. Do not wear sandals. If you use a stepladder: Make sure that it is fully opened. Do not climb a closed stepladder. Make sure that both sides of the stepladder are locked into place. Ask someone to hold it for you, if possible. Clearly mark and make sure that you can see: Any grab bars or handrails. First and last steps. Where the edge of each step is. Use tools that help you move around (mobility aids) if they are needed. These include: Canes. Walkers. Scooters. Crutches. Turn on the lights when you go into a dark area. Replace any light bulbs as soon as they burn out. Set up your furniture so you have a clear path. Avoid moving your furniture around. If any of your floors are uneven, fix them. If there are any pets around you, be aware of where they are. Review your medicines with your doctor. Some medicines can make you feel dizzy. This can increase your chance of falling. Ask your doctor what other things that you can do to help prevent falls. This information is not intended to replace advice given to you by your health care provider. Make sure you discuss any questions you have with your health care provider. Document Released: 12/02/2008 Document Revised: 07/14/2015 Document Reviewed:  03/12/2014 Elsevier Interactive Patient Education  2017 [employer].

## 2021-03-07 NOTE — Progress Notes (Signed)
Subjective:   Heather Dawson is a 66 y.o. female who presents for Medicare Annual (Subsequent) preventive examination.  Review of Systems     Cardiac Risk Factors include: advanced age (>61men, >42 women);dyslipidemia;family history of premature cardiovascular disease;hypertension;obesity (BMI >30kg/m2)     Objective:    Today's Vitals   03/07/21 1108  BP: 118/70  Pulse: (!) 57  Temp: 98.1 F (36.7 C)  SpO2: 98%  Weight: 182 lb 12.8 oz (82.9 kg)  Height: 5\' 2"  (1.575 m)  PainSc: 0-No pain   Body mass index is 33.43 kg/m.  Advanced Directives 03/07/2021 10/13/2020 10/12/2020 11/13/2019 09/10/2017 12/01/2015 08/25/2014  Does Patient Have a Medical Advance Directive? No No No No No No No  Would patient like information on creating a medical advance directive? No - Patient declined No - Patient declined - No - Patient declined - No - patient declined information No - patient declined information    Current Medications (verified) Outpatient Encounter Medications as of 03/07/2021  Medication Sig   acetaminophen (TYLENOL) 500 MG tablet Take 1,000 mg by mouth every 8 (eight) hours as needed (pain).   amLODipine (NORVASC) 10 MG tablet Take 1 tablet (10 mg total) by mouth daily.   Ascorbic Acid (VITAMIN C) 1000 MG tablet Take 1,000 mg by mouth daily.   aspirin EC 81 MG tablet Take 81 mg by mouth daily. Swallow whole.   Cholecalciferol (VITAMIN D3) 1000 units CAPS Take 2 capsules (2,000 Units total) by mouth daily.   clobetasol (TEMOVATE) 0.05 % external solution Apply 1 application topically daily as needed (scalp psoriasis).    diclofenac sodium (VOLTAREN) 1 % GEL Apply 4 g topically 4 (four) times daily as needed. (Patient taking differently: Apply 4 g topically 4 (four) times daily as needed (pain).)   hydrALAZINE (APRESOLINE) 100 MG tablet Take 1 tablet (100 mg total) by mouth 3 (three) times daily.   Insulin Pen Needle (NOVOFINE PEN NEEDLE) 32G X 6 MM MISC Use as directed once weekly  E11.9   levocetirizine (XYZAL) 5 MG tablet Take 5 mg by mouth every evening.   levothyroxine (SYNTHROID) 75 MCG tablet TAKE ONE TABLET BY MOUTH DAILY (Patient taking differently: Take 75 mcg by mouth daily before breakfast.)   lisinopril (ZESTRIL) 20 MG tablet Take 20 mg by mouth daily.   lovastatin (MEVACOR) 40 MG tablet Take 2 tablets (80 mg total) by mouth at bedtime.   pantoprazole (PROTONIX) 40 MG tablet TAKE ONE TABLET BY MOUTH DAILY (Patient taking differently: Take 40 mg by mouth daily.)   sucralfate (CARAFATE) 1 g tablet Take 1 tablet (1 g total) by mouth 4 (four) times daily -  with meals and at bedtime.   SUMAtriptan (IMITREX) 100 MG tablet TAKE ONE TABLET BY MOUTH AT ONSET OF MIGRAINE OR HEADACHE; MAY REPEAT ONE TABLET IN 2 HOURS IF NEEDED. (Patient taking differently: Take 100 mg by mouth daily as needed for migraine. TAKE ONE TABLET BY MOUTH AT ONSET OF MIGRAINE OR HEADACHE; MAY REPEAT ONE TABLET IN 2 HOURS IF NEEDED.)   triamcinolone cream (KENALOG) 0.1 % Apply 1 application topically daily.   TURMERIC CURCUMIN PO Take 1 tablet by mouth daily.   VICTOZA 18 MG/3ML SOPN DIAL AND INJECT UNDER THE SKIN 0.6 MG DAILY FOR 1 WEEK THEN 1.2 MG DAILY THEREAFTER   No facility-administered encounter medications on file as of 03/07/2021.    Allergies (verified) Bactrim [sulfamethoxazole-trimethoprim], Cefuroxime axetil, Ciprofloxacin, Doxycycline, Levaquin [levofloxacin in d5w], Lipitor [atorvastatin], Lyrica [pregabalin], Macrodantin [nitrofurantoin  macrocrystal], Phentermine, and Gabapentin   History: Past Medical History:  Diagnosis Date   Abdominal pain, epigastric 12/20/2006   ALLERGIC RHINITIS 12/25/2006   Allergy    ANXIETY 10/16/2006   no per pt   Arthritis    right shoulder   BACK PAIN 06/15/2008   Blood transfusion without reported diagnosis    BRADYCARDIA 05/04/2008   C. difficile diarrhea    Carpal tunnel syndrome 10/16/2006   Cellulitis and abscess of other specified site  06/15/2008   CHEST PAIN 09/08/2009   Chronic low back pain    CIRRHOSIS 10/16/2006   CKD (chronic kidney disease)    2008- had Ecoli and caused renal failure, no problems since then   CKD (chronic kidney disease) stage 3, GFR 30-59 ml/min ([place]) 10/16/2006   Qualifier: Diagnosis of  By: Jenny Reichmann MD, [place] 13/09/6576   Complication of anesthesia    awake during 2 surgeries   CONTUSION, LOWER LEG 09/15/2008   DEPRESSION 10/16/2006   no per pt   Diabetes mellitus without complication ([place])    Esophageal reflux 12/25/2006   HYPERLIPIDEMIA 12/25/2006   Hyperlipidemia    HYPERTENSION 08/15/2006   Hypertension    HYPOTHYROIDISM 08/15/2006   Kidney failure, acute ([place]) 2009   as a result of severe E Coli infection   Left-sided carotid artery disease ([place])    MIGRAINE HEADACHE 10/16/2006   Obesity 10/16/2006   OVARIAN CYST 12/20/2006   Psoriasis 01/03/2011   SHINGLES, HX OF 12/20/2006   Stroke ([place])    3 strokes 2008-no deficits, only on ASA   TRANSIENT ISCHEMIC ATTACKS, HX OF 12/20/2006   VERTIGO 09/15/2007   VITAMIN B12 DEFICIENCY 01/09/2007   Past Surgical History:  Procedure Laterality Date   ABDOMINAL HYSTERECTOMY  02/2006   ANTERIOR CERVICAL DECOMP/DISCECTOMY FUSION  2005   CESAREAN SECTION     CHOLECYSTECTOMY     COLONOSCOPY     ENDARTERECTOMY Left 08/25/2014   Procedure: LEFT CAROTID ENDARTERECTOMY WITH HEMASHIELD PATCH ANGIOPLASTY;  Surgeon: Elam Dutch, MD;  Location: [facility] OR;  Service: Vascular;  Laterality: Left;   NECK SURGERY  2005   ROTATOR CUFF REPAIR Right 02/2014   Dr Veverly Fells   Family History  Problem Relation Age of Onset   Stroke Father    Heart disease Father    Diabetes Father    Lupus Mother    Raynaud syndrome Mother    Heart disease Mother    Diabetes Sister    Heart disease Brother    Hypertension Brother    Heart disease Brother    Hypertension Brother    Diabetes Other    Hypertension Other    Colon cancer Neg Hx    Esophageal cancer  Neg Hx    Rectal cancer Neg Hx    Stomach cancer Neg Hx    Social History   Socioeconomic History   Marital status: Married    Spouse name: [name]   Number of children: Not on file   Years of education: Not on file   Highest education level: Not on file  Occupational History   Occupation: retired  Tobacco Use   Smoking status: Former   Smokeless tobacco: Never   Tobacco comments:    quit smoking over 30 years ago  Vaping Use   Vaping Use: Never used  Substance and Sexual Activity   Alcohol use: No   Drug use: No   Sexual activity: Not on file  Other Topics Concern  Not on file  Social History Narrative   Lives with husband   Right Handed   Drinks 2-4 cups coffee   Social Determinants of Health   Financial Resource Strain: Low Risk    Difficulty of Paying Living Expenses: Not hard at all  Food Insecurity: No Food Insecurity   Worried About Charity fundraiser in the Last Year: Never true   Arboriculturist in the Last Year: Never true  Transportation Needs: No Transportation Needs   Lack of Transportation (Medical): No   Lack of Transportation (Non-Medical): No  Physical Activity: Sufficiently Active   Days of Exercise per Week: 5 days   Minutes of Exercise per Session: 30 min  Stress: No Stress Concern Present   Feeling of Stress : Not at all  Social Connections: Socially Integrated   Frequency of Communication with Friends and Family: More than three times a week   Frequency of Social Gatherings with Friends and Family: More than three times a week   Attends Religious Services: More than 4 times per year   Active Member of [employer] or Organizations: Yes   Attends Music therapist: More than 4 times per year   Marital Status: Married    Tobacco Counseling Counseling given: Not Answered Tobacco comments: quit smoking over 30 years ago   Clinical Intake:  Pre-visit preparation completed: Yes  Pain : No/denies pain Pain Score: 0-No pain      BMI - recorded: 33.43 Nutritional Status: BMI > 30  Obese Nutritional Risks: None Diabetes: Yes CBG done?: No Did pt. bring in CBG monitor from home?: No  How often do you need to have someone help you when you read instructions, pamphlets, or other written materials from your doctor or pharmacy?: 1 - Never What is the last grade level you completed in school?: High School Graduate  Diabetic? yes  Interpreter Needed?: No  Information entered by :: Lisette Abu, LPN   Activities of Daily Living In your present state of health, do you have any difficulty performing the following activities: 03/07/2021 10/13/2020  Hearing? N N  Vision? N N  Difficulty concentrating or making decisions? Y N  Walking or climbing stairs? N Y  Dressing or bathing? N N  Doing errands, shopping? N N  Preparing Food and eating ? N -  Using the Toilet? N -  In the past six months, have you accidently leaked urine? Y -  Comment only when laughing, sneezing or coughing -  Do you have problems with loss of bowel control? N -  Managing your Medications? N -  Managing your Finances? N -  Housekeeping or managing your Housekeeping? N -  Some recent data might be hidden    Patient Care Team: Biagio Borg, MD as PCP - General Gwenlyn Found Pearletha Forge, MD as Consulting Physician (Cardiology) Inda Castle, MD (Inactive) as Consulting Physician (Gastroenterology) Netta Cedars, MD as Consulting Physician (Orthopedic Surgery) Melina Schools, MD as Consulting Physician (Orthopedic Surgery)  Indicate any recent Medical Services you may have received from other than [facility] providers in the past year (date may be approximate).     Assessment:   This is a routine wellness examination for Tangi.  Hearing/Vision screen Hearing Screening - Comments:: Patient denied any hearing difficulty.   No hearing aids.  Vision Screening - Comments:: Patient wears corrective glasses/contacts.  Eye exam done annually by:  Dr.Howard McFarland at [facility].   Dietary issues and exercise activities discussed: Current  Exercise Habits: Home exercise routine, Type of exercise: walking, Time (Minutes): 30, Frequency (Times/Week): 5, Weekly Exercise (Minutes/Week): 150, Intensity: Mild, Exercise limited by: orthopedic condition(s);cardiac condition(s)   Goals Addressed               This Visit's Progress     Patient Stated (pt-stated)        My goal is to stay healthy and look after my kidney.      Depression Screen PHQ 2/9 Scores 03/07/2021 03/23/2020 11/13/2019 07/03/2019 04/30/2018 04/23/2017 04/28/2014  PHQ - 2 Score 0 0 0 0 0 0 0  PHQ- 9 Score - 0 - - - - -    Fall Risk Fall Risk  03/07/2021 03/23/2020 11/13/2019 07/03/2019 04/30/2018  Falls in the past year? 0 0 0 0 0  Number falls in past yr: 0 0 0 0 -  Injury with Fall? 0 0 0 0 -  Risk for fall due to : No Fall Risks - No Fall Risks No Fall Risks -  Follow up Falls evaluation completed - Falls evaluation completed Falls evaluation completed -    FALL RISK PREVENTION PERTAINING TO THE HOME:  Any stairs in or around the home? No  If so, are there any without handrails? No  Home free of loose throw rugs in walkways, pet beds, electrical cords, etc? Yes  Adequate lighting in your home to reduce risk of falls? Yes   ASSISTIVE DEVICES UTILIZED TO PREVENT FALLS:  Life alert? No  Use of a cane, walker or w/c? No  Grab bars in the bathroom? Yes  Shower chair or bench in shower? Yes  Elevated toilet seat or a handicapped toilet? Yes   TIMED UP AND GO:  Was the test performed? Yes .  Length of time to ambulate 10 feet: 6 sec.   Gait steady and fast without use of assistive device  Cognitive Function: Normal cognitive status assessed by direct observation by this Nurse Health Advisor. No abnormalities found.          Immunizations Immunization History  Administered Date(s) Administered   Influenza Split 01/03/2011   Influenza Whole  12/28/2005, 01/07/2007, 11/18/2007, 11/18/2008   Influenza,inj,Quad PF,6+ Mos 12/30/2012, 11/06/2013, 11/10/2014, 12/20/2015, 11/13/2016, 10/24/2017, 10/31/2018, 03/23/2020, 12/27/2020   PFIZER Comirnaty(Gray Top)Covid-19 Tri-Sucrose Vaccine 07/03/2019, 07/24/2019, 02/04/2020   Pneumococcal Conjugate-13 05/03/2016   Pneumococcal Polysaccharide-23 04/23/2017   Td 08/30/2009   Zoster Recombinat (Shingrix) 04/23/2017    TDAP status: Due, Education has been provided regarding the importance of this vaccine. Advised may receive this vaccine at local pharmacy or Health Dept. Aware to provide a copy of the vaccination record if obtained from local pharmacy or Health Dept. Verbalized acceptance and understanding.  Flu Vaccine status: Up to date  Pneumococcal vaccine status: Up to date  Covid-19 vaccine status: Completed vaccines  Qualifies for Shingles Vaccine? Yes   Zostavax completed No   Shingrix Completed?: No.    Education has been provided regarding the importance of this vaccine. Patient has been advised to call insurance company to determine out of pocket expense if they have not yet received this vaccine. Advised may also receive vaccine at local pharmacy or Health Dept. Verbalized acceptance and understanding.  Screening Tests Health Maintenance  Topic Date Due   Zoster Vaccines- Shingrix (2 of 2) 06/18/2017   COVID-19 Vaccine (4 - Booster for Pfizer series) 03/31/2020   FOOT EXAM  07/02/2020   TETANUS/TDAP  05/10/2021 (Originally 08/31/2019)   HEMOGLOBIN A1C  04/18/2021   OPHTHALMOLOGY  EXAM  04/20/2021   MAMMOGRAM  03/22/2022   Pneumococcal Vaccine 81-66 Years old (3 - PPSV23 if available, else PCV20) 04/24/2022   COLONOSCOPY (Pts 45-67yrs Insurance coverage will need to be confirmed)  12/16/2023   INFLUENZA VACCINE  Completed   Hepatitis C Screening  Completed   HIV Screening  Completed   HPV VACCINES  Aged Out    Health Maintenance  Health Maintenance Due  Topic Date Due    Zoster Vaccines- Shingrix (2 of 2) 06/18/2017   COVID-19 Vaccine (4 - Booster for Pfizer series) 03/31/2020   FOOT EXAM  07/02/2020    Colorectal cancer screening: Type of screening: Colonoscopy. Completed 12/16/2018. Repeat every 5 years  Mammogram status: Completed 03/22/2020. Repeat every year  Bone density status: never done  Lung Cancer Screening: (Low Dose CT Chest recommended if Age 40-80 years, 30 pack-year currently smoking OR have quit w/in 15years.) does not qualify.   Lung Cancer Screening Referral: no  Additional Screening:  Hepatitis C Screening: does qualify; Completed yes  Vision Screening: Recommended annual ophthalmology exams for early detection of glaucoma and other disorders of the eye. Is the patient up to date with their annual eye exam?  Yes  Who is the provider or what is the name of the office in which the patient attends annual eye exams? Dr. Webb Laws at [facility]. If pt is not established with a provider, would they like to be referred to a provider to establish care? No .   Dental Screening: Recommended annual dental exams for proper oral hygiene  Community Resource Referral / Chronic Care Management: CRR required this visit?  No   CCM required this visit?  No      Plan:     I have personally reviewed and noted the following in the patients chart:   Medical and social history Use of alcohol, tobacco or illicit drugs  Current medications and supplements including opioid prescriptions.  Functional ability and status Nutritional status Physical activity Advanced directives List of other physicians Hospitalizations, surgeries, and ER visits in previous 12 months Vitals Screenings to include cognitive, depression, and falls Referrals and appointments  In addition, I have reviewed and discussed with patient certain preventive protocols, quality metrics, and best practice recommendations. A written personalized care plan  for preventive services as well as general preventive health recommendations were provided to patient.     Sheral Flow, LPN   6/46/8032   Nurse Notes:  Hearing Screening - Comments:: Patient denied any hearing difficulty.   No hearing aids.  Vision Screening - Comments:: Patient wears corrective glasses/contacts.  Eye exam done annually by: Dr.Howard McFarland at [facility].

## 2021-03-21 DIAGNOSIS — M25551 Pain in right hip: Secondary | ICD-10-CM | POA: Diagnosis not present

## 2021-03-29 ENCOUNTER — Encounter: Payer: Self-pay | Admitting: Internal Medicine

## 2021-03-29 ENCOUNTER — Ambulatory Visit: Payer: Medicare Other | Admitting: Internal Medicine

## 2021-03-29 VITALS — BP 112/68 | HR 73 | Ht 62.0 in | Wt 181.0 lb

## 2021-03-29 DIAGNOSIS — R195 Other fecal abnormalities: Secondary | ICD-10-CM | POA: Diagnosis not present

## 2021-03-29 DIAGNOSIS — K559 Vascular disorder of intestine, unspecified: Secondary | ICD-10-CM | POA: Diagnosis not present

## 2021-03-29 DIAGNOSIS — K219 Gastro-esophageal reflux disease without esophagitis: Secondary | ICD-10-CM

## 2021-03-29 NOTE — Progress Notes (Signed)
HISTORY OF PRESENT ILLNESS:  Heather Dawson is a 66 y.o. female with a history of C. difficile associated diarrhea, GERD, and adenomatous colon polyps.  She also has a history of acute segmental colitis.  Last evaluated in this office October 2022.  See that dictation.  She scheduled this appointment for herself today after she was having difficulties with loose stools and nausea.  Approximately 4 weeks ago her nephrologist suggested that she try a probiotic.  She has been taking align once daily.  She states that she is feeling much better since that time.  She denies problems with nocturnal loose stools.  She generally has bowel movement after drinking her coffee in the morning and again after finishing breakfast.  No new issues or complaints.  She is accompanied today by her husband  REVIEW OF SYSTEMS:  All non-GI ROS negative unless otherwise stated in the HPI except for arthritis, back pain, fatigue  Past Medical History:  Diagnosis Date   Abdominal pain, epigastric 12/20/2006   ALLERGIC RHINITIS 12/25/2006   Allergy    ANXIETY 10/16/2006   no per pt   Arthritis    right shoulder   BACK PAIN 06/15/2008   Blood transfusion without reported diagnosis    BRADYCARDIA 05/04/2008   C. difficile diarrhea    Carpal tunnel syndrome 10/16/2006   Cellulitis and abscess of other specified site 06/15/2008   CHEST PAIN 09/08/2009   Chronic low back pain    CIRRHOSIS 10/16/2006   CKD (chronic kidney disease)    2008- had Ecoli and caused renal failure, no problems since then   CKD (chronic kidney disease) stage 3, GFR 30-59 ml/min ([place]) 10/16/2006   Qualifier: Diagnosis of  By: Jenny Reichmann MD, [place] 08/19/1599   Complication of anesthesia    awake during 2 surgeries   CONTUSION, LOWER LEG 09/15/2008   DEPRESSION 10/16/2006   no per pt   Diabetes mellitus without complication ([place])    Esophageal reflux 12/25/2006   HYPERLIPIDEMIA 12/25/2006   Hyperlipidemia    HYPERTENSION 08/15/2006    Hypertension    HYPOTHYROIDISM 08/15/2006   Kidney failure, acute ([place]) 2009   as a result of severe E Coli infection   Left-sided carotid artery disease ([place])    MIGRAINE HEADACHE 10/16/2006   Obesity 10/16/2006   OVARIAN CYST 12/20/2006   Psoriasis 01/03/2011   SHINGLES, HX OF 12/20/2006   Stroke ([place])    3 strokes 2008-no deficits, only on ASA   TRANSIENT ISCHEMIC ATTACKS, HX OF 12/20/2006   VERTIGO 09/15/2007   VITAMIN B12 DEFICIENCY 01/09/2007    Past Surgical History:  Procedure Laterality Date   ABDOMINAL HYSTERECTOMY  02/2006   ANTERIOR CERVICAL DECOMP/DISCECTOMY FUSION  2005   CESAREAN SECTION     CHOLECYSTECTOMY     COLONOSCOPY     ENDARTERECTOMY Left 08/25/2014   Procedure: LEFT CAROTID ENDARTERECTOMY WITH HEMASHIELD PATCH ANGIOPLASTY;  Surgeon: Elam Dutch, MD;  Location: [facility];  Service: Vascular;  Laterality: Left;   NECK SURGERY  2005   ROTATOR CUFF REPAIR Right 02/2014   Dr Veverly Fells    Social History Heather Dawson  reports that she has quit smoking. She has never used smokeless tobacco. She reports that she does not drink alcohol and does not use drugs.  family history includes Diabetes in her father, sister, and another family member; Heart disease in her brother, brother, father, and mother; Hypertension in her brother, brother and another family member; Lupus  in her mother; Raynaud syndrome in her mother; Stroke in her father.  Allergies  Allergen Reactions   Bactrim [Sulfamethoxazole-Trimethoprim] Nausea And Vomiting   Cefuroxime Axetil Swelling     edema/swelling   Ciprofloxacin Swelling   Doxycycline Nausea Only   Levaquin [Levofloxacin In D5w] Other (See Comments)    GI upset   Lipitor [Atorvastatin] Other (See Comments)    Myalgia    Lyrica [Pregabalin] Other (See Comments)    HA, confusion, swelling   Macrodantin [Nitrofurantoin Macrocrystal] Other (See Comments)    GI upset, diarrhea and sob   Phentermine Other (See Comments)    All the side  effects listed on the paper   Gabapentin Rash       PHYSICAL EXAMINATION: Vital signs: BP 112/68    Pulse 73    Ht 5\' 2"  (1.575 m)    Wt 181 lb (82.1 kg)    BMI 33.11 kg/m   Constitutional: Pleasant generally well-appearing, no acute distress Psychiatric: alert and oriented x3, cooperative Eyes: extraocular movements intact, anicteric, conjunctiva pink Mouth: oral pharynx moist, no lesions Neck: supple no lymphadenopathy Cardiovascular: heart regular rate and rhythm, no murmur Lungs: clear to auscultation bilaterally Abdomen: soft, obese, nontender, nondistended, no obvious ascites, no peritoneal signs, normal bowel sounds, no organomegaly Rectal: Omitted Extremities: no clubbing, cyanosis, or lower extremity edema bilaterally Skin: no lesions on visible extremities Neuro: No focal deficits.    ASSESSMENT:  1.  Nonspecific complaints of loose stools, generally in the morning.  Improved after probiotic 2.  Nausea.  Improved 3.  History of C. difficile related diarrhea 4.  History of ischemic colitis 5.  History of adenomatous colon polyps.  Surveillance up-to-date 6.  GERD.  Asymptomatic on PPI   PLAN:  1.  Okay to continue 2.  Reflux precautions 3.  Continue PPI 4.  Surveillance colonoscopy around October 2027 5.  Interval follow-up as needed

## 2021-03-29 NOTE — Patient Instructions (Signed)
If you are age 65 or older, your body mass index should be between 23-30. Your Body mass index is 33.11 kg/m. If this is out of the aforementioned range listed, please consider follow up with your Primary Care Provider.  If you are age 65 or younger, your body mass index should be between 19-25. Your Body mass index is 33.11 kg/m. If this is out of the aformentioned range listed, please consider follow up with your Primary Care Provider.   ________________________________________________________  The [facility] providers would like to encourage you to use [facility] to communicate with providers for non-urgent requests or questions.  Due to long hold times on the telephone, sending your provider a message by [facility] may be a faster and more efficient way to get a response.  Please allow 48 business hours for a response.  Please remember that this is for non-urgent requests.  _______________________________________________________   Please follow up in one year

## 2021-03-30 ENCOUNTER — Encounter: Payer: Self-pay | Admitting: Internal Medicine

## 2021-03-30 DIAGNOSIS — J3489 Other specified disorders of nose and nasal sinuses: Secondary | ICD-10-CM

## 2021-03-30 DIAGNOSIS — M25551 Pain in right hip: Secondary | ICD-10-CM | POA: Diagnosis not present

## 2021-04-03 DIAGNOSIS — Z1231 Encounter for screening mammogram for malignant neoplasm of breast: Secondary | ICD-10-CM | POA: Diagnosis not present

## 2021-04-03 DIAGNOSIS — Z01419 Encounter for gynecological examination (general) (routine) without abnormal findings: Secondary | ICD-10-CM | POA: Diagnosis not present

## 2021-04-03 DIAGNOSIS — Z6832 Body mass index (BMI) 32.0-32.9, adult: Secondary | ICD-10-CM | POA: Diagnosis not present

## 2021-04-03 DIAGNOSIS — Z1272 Encounter for screening for malignant neoplasm of vagina: Secondary | ICD-10-CM | POA: Diagnosis not present

## 2021-04-04 DIAGNOSIS — M5416 Radiculopathy, lumbar region: Secondary | ICD-10-CM | POA: Diagnosis not present

## 2021-04-17 ENCOUNTER — Encounter: Payer: Self-pay | Admitting: Internal Medicine

## 2021-04-18 ENCOUNTER — Other Ambulatory Visit: Payer: Self-pay | Admitting: *Deleted

## 2021-04-18 DIAGNOSIS — I6523 Occlusion and stenosis of bilateral carotid arteries: Secondary | ICD-10-CM

## 2021-04-19 DIAGNOSIS — M25551 Pain in right hip: Secondary | ICD-10-CM | POA: Diagnosis not present

## 2021-04-20 ENCOUNTER — Encounter: Payer: Self-pay | Admitting: Internal Medicine

## 2021-04-21 ENCOUNTER — Telehealth (INDEPENDENT_AMBULATORY_CARE_PROVIDER_SITE_OTHER): Payer: Medicare Other | Admitting: Internal Medicine

## 2021-04-21 DIAGNOSIS — E669 Obesity, unspecified: Secondary | ICD-10-CM

## 2021-04-21 DIAGNOSIS — U071 COVID-19: Secondary | ICD-10-CM

## 2021-04-21 DIAGNOSIS — E1169 Type 2 diabetes mellitus with other specified complication: Secondary | ICD-10-CM | POA: Diagnosis not present

## 2021-04-21 DIAGNOSIS — I10 Essential (primary) hypertension: Secondary | ICD-10-CM

## 2021-04-21 MED ORDER — NIRMATRELVIR/RITONAVIR (PAXLOVID) TABLET (RENAL DOSING): 2.0000 | ORAL_TABLET | Freq: Two times a day (BID) | ORAL | 0 refills | Status: AC

## 2021-04-21 NOTE — Progress Notes (Signed)
Patient ID: Heather Dawson, female   DOB: [birth year], 66 y.o.   MRN: [id number] ? ?Virtual Visit via Video Note ? ?I connected with Heather Dawson on 04/23/21 at 10:00 AM EST by a video enabled telemedicine application and verified that I am speaking with the correct person using two identifiers. ? ?Location of all participants today ?Patient:  at home ?Provider:  at office ?  ?I discussed the limitations of evaluation and management by telemedicine and the availability of in person appointments. The patient expressed understanding and agreed to proceed. ? ?History of Present Illness: ?Here to f/u after 2 days onset URI symptoms and found covid positive by home testing; symptos include fever, sweats, non prod cough, HA but taste and smell ok, no sob or n/v/d.  Pt denies chest pain, increased sob or doe, wheezing, orthopnea, PND, increased LE swelling, palpitations, dizziness or syncope.  Denies worsening reflux, abd pain, dysphagia, n/v, bowel change or blood.  Last o2 sat this am at home is 96%.  Temp 97.5 this am.  BP 104/67    Did have phizer covid vax x 2 and booster.   Pt denies polydipsia, polyuria, or new focal neuro s/s.  ? ?Past Medical History:  ?Diagnosis Date  ? Abdominal pain, epigastric 12/20/2006  ? ALLERGIC RHINITIS 12/25/2006  ? Allergy   ? ANXIETY 10/16/2006  ? no per pt  ? Arthritis   ? right shoulder  ? BACK PAIN 06/15/2008  ? Blood transfusion without reported diagnosis   ? BRADYCARDIA 05/04/2008  ? C. difficile diarrhea   ? Carpal tunnel syndrome 10/16/2006  ? Cellulitis and abscess of other specified site 06/15/2008  ? CHEST PAIN 09/08/2009  ? Chronic low back pain   ? CIRRHOSIS 10/16/2006  ? CKD (chronic kidney disease)   ? 2008- had Ecoli and caused renal failure, no problems since then  ? CKD (chronic kidney disease) stage 3, GFR 30-59 ml/min (HCC) 10/16/2006  ? Qualifier: Diagnosis of  By: Jenny Reichmann MD, [place] Oris   ? COMMON MIGRAINE 12/20/2006  ? Complication of anesthesia   ? awake during 2 surgeries  ?  CONTUSION, LOWER LEG 09/15/2008  ? DEPRESSION 10/16/2006  ? no per pt  ? Diabetes mellitus without complication ([place])   ? Esophageal reflux 12/25/2006  ? HYPERLIPIDEMIA 12/25/2006  ? Hyperlipidemia   ? HYPERTENSION 08/15/2006  ? Hypertension   ? HYPOTHYROIDISM 08/15/2006  ? Kidney failure, acute ([place]) 2009  ? as a result of severe E Coli infection  ? Left-sided carotid artery disease ([place])   ? MIGRAINE HEADACHE 10/16/2006  ? Obesity 10/16/2006  ? OVARIAN CYST 12/20/2006  ? Psoriasis 01/03/2011  ? SHINGLES, HX OF 12/20/2006  ? Stroke [facility])   ? 3 strokes 2008-no deficits, only on ASA  ? TRANSIENT ISCHEMIC ATTACKS, HX OF 12/20/2006  ? VERTIGO 09/15/2007  ? VITAMIN B12 DEFICIENCY 01/09/2007  ? ?Past Surgical History:  ?Procedure Laterality Date  ? ABDOMINAL HYSTERECTOMY  02/2006  ? ANTERIOR CERVICAL DECOMP/DISCECTOMY FUSION  2005  ? CESAREAN SECTION    ? CHOLECYSTECTOMY    ? COLONOSCOPY    ? ENDARTERECTOMY Left 08/25/2014  ? Procedure: LEFT CAROTID ENDARTERECTOMY WITH HEMASHIELD PATCH ANGIOPLASTY;  Surgeon: Elam Dutch, MD;  Location: [place];  Service: Vascular;  Laterality: Left;  ? NECK SURGERY  2005  ? ROTATOR CUFF REPAIR Right 02/2014  ? Dr Veverly Fells  ? ? reports that she has quit smoking. She has never used smokeless tobacco. She reports that she does not  drink alcohol and does not use drugs. ?family history includes Diabetes in her father, sister, and another family member; Heart disease in her brother, brother, father, and mother; Hypertension in her brother, brother and another family member; Lupus in her mother; Raynaud syndrome in her mother; Stroke in her father. ?Allergies  ?Allergen Reactions  ? Bactrim [Sulfamethoxazole-Trimethoprim] Nausea And Vomiting  ? Cefuroxime Axetil Swelling  ?   edema/swelling  ? Ciprofloxacin Swelling  ? Doxycycline Nausea Only  ? Levaquin [Levofloxacin In D5w] Other (See Comments)  ?  GI upset  ? Lipitor [Atorvastatin] Other (See Comments)  ?  Myalgia ?  ? Lyrica [Pregabalin] Other (See  Comments)  ?  HA, confusion, swelling  ? Macrodantin [Nitrofurantoin Macrocrystal] Other (See Comments)  ?  GI upset, diarrhea and sob  ? Phentermine Other (See Comments)  ?  All the side effects listed on the paper  ? Gabapentin Rash  ? ?Current Outpatient Medications on File Prior to Visit  ?Medication Sig Dispense Refill  ? acetaminophen (TYLENOL) 500 MG tablet Take 1,000 mg by mouth every 8 (eight) hours as needed (pain).    ? amLODipine (NORVASC) 10 MG tablet Take 1 tablet (10 mg total) by mouth daily. 90 tablet 3  ? Ascorbic Acid (VITAMIN C) 1000 MG tablet Take 1,000 mg by mouth daily.    ? aspirin EC 81 MG tablet Take 81 mg by mouth daily. Swallow whole.    ? Cholecalciferol (VITAMIN D3) 1000 units CAPS Take 2 capsules (2,000 Units total) by mouth daily. 60 capsule 0  ? clobetasol (TEMOVATE) 0.05 % external solution Apply 1 application topically daily as needed (scalp psoriasis).   0  ? diclofenac sodium (VOLTAREN) 1 % GEL Apply 4 g topically 4 (four) times daily as needed. (Patient taking differently: Apply 4 g topically 4 (four) times daily as needed (pain).) 400 g 11  ? hydrALAZINE (APRESOLINE) 100 MG tablet Take 1 tablet (100 mg total) by mouth 3 (three) times daily. 90 tablet 11  ? Insulin Pen Needle (NOVOFINE PEN NEEDLE) 32G X 6 MM MISC Use as directed once weekly E11.9 12 each 3  ? levocetirizine (XYZAL) 5 MG tablet Take 5 mg by mouth every evening.    ? levothyroxine (SYNTHROID) 75 MCG tablet TAKE ONE TABLET BY MOUTH DAILY (Patient taking differently: Take 75 mcg by mouth daily before breakfast.) 90 tablet 3  ? lisinopril (ZESTRIL) 20 MG tablet Take 20 mg by mouth daily.    ? lovastatin (MEVACOR) 40 MG tablet Take 2 tablets (80 mg total) by mouth at bedtime. 180 tablet 3  ? pantoprazole (PROTONIX) 40 MG tablet TAKE ONE TABLET BY MOUTH DAILY (Patient taking differently: Take 40 mg by mouth daily.) 90 tablet 3  ? SUMAtriptan (IMITREX) 100 MG tablet TAKE ONE TABLET BY MOUTH AT ONSET OF MIGRAINE OR  HEADACHE; MAY REPEAT ONE TABLET IN 2 HOURS IF NEEDED. (Patient taking differently: Take 100 mg by mouth daily as needed for migraine. TAKE ONE TABLET BY MOUTH AT ONSET OF MIGRAINE OR HEADACHE; MAY REPEAT ONE TABLET IN 2 HOURS IF NEEDED.) 10 tablet 2  ? triamcinolone cream (KENALOG) 0.1 % Apply 1 application topically daily.    ? TURMERIC CURCUMIN PO Take 1 tablet by mouth daily.    ? ?No current facility-administered medications on file prior to visit.  ?  ?Observations/Objective: ?Alert, NAD, appropriate mood and affect, resps normal, cn 2-12 intact, moves all 4s, no visible rash or swelling ?Lab Results  ?Component Value Date  ?  WBC 6.8 01/10/2021  ? HGB 12.0 01/10/2021  ? HCT 36.0 01/10/2021  ? PLT 212.0 01/10/2021  ? GLUCOSE 127 (H) 01/10/2021  ? CHOL 139 10/18/2020  ? TRIG 199.0 (H) 10/18/2020  ? HDL 33.00 (L) 10/18/2020  ? LDLDIRECT 140.0 08/05/2018  ? [place] 67 10/18/2020  ? ALT 10 10/18/2020  ? AST 13 10/18/2020  ? NA 138 01/10/2021  ? K 4.4 01/10/2021  ? CL 108 01/10/2021  ? CREATININE 2.19 (H) 01/10/2021  ? BUN 39 (H) 01/10/2021  ? CO2 20 01/10/2021  ? TSH 2.79 05/10/2020  ? INR 0.99 08/17/2014  ? HGBA1C 5.6 10/18/2020  ? MICROALBUR 3.3 (H) 05/01/2019  ? ?Assessment and Plan: ?See notes ? ?Follow Up Instructions: ?See notes ?  ?I discussed the assessment and treatment plan with the patient. The patient was provided an opportunity to ask questions and all were answered. The patient agreed with the plan and demonstrated an understanding of the instructions. ?  ?The patient was advised to call back or seek an in-person evaluation if the symptoms worsen or if the condition fails to improve as anticipated. ? ?[name], MD ? ?

## 2021-04-23 ENCOUNTER — Encounter: Payer: Self-pay | Admitting: Internal Medicine

## 2021-04-23 DIAGNOSIS — U071 COVID-19: Secondary | ICD-10-CM | POA: Insufficient documentation

## 2021-04-23 NOTE — Assessment & Plan Note (Signed)
New onset, good candidate for paxlovid renal dosed, delsym otc prn cough, self isolate x 5 days, hold lovastatin on paxlovid,  to f/u any worsening symptoms or concerns ? ?

## 2021-04-23 NOTE — Assessment & Plan Note (Signed)
BP Readings from Last 3 Encounters:  ?03/29/21 112/68  ?03/07/21 118/70  ?12/27/20 130/72  ? ?Stable, pt to continue medical treatment norvasc, lisinopril ? ?

## 2021-04-23 NOTE — Patient Instructions (Signed)
Please take all new medication as prescribed 

## 2021-04-23 NOTE — Assessment & Plan Note (Signed)
Lab Results  ?Component Value Date  ? HGBA1C 5.6 10/18/2020  ? ?Stable, pt to continue current medical treatment  - diet ? ?

## 2021-04-26 ENCOUNTER — Other Ambulatory Visit: Payer: Self-pay | Admitting: Internal Medicine

## 2021-04-26 NOTE — Telephone Encounter (Signed)
Please refill as per office routine med refill policy (all routine meds to be refilled for 3 mo or monthly (per pt preference) up to one year from last visit, then month to month grace period for 3 mo, then further med refills will have to be denied) ? ?

## 2021-04-28 ENCOUNTER — Encounter (HOSPITAL_COMMUNITY): Payer: Medicare Other

## 2021-04-28 ENCOUNTER — Ambulatory Visit: Payer: Medicare Other

## 2021-04-28 ENCOUNTER — Encounter: Payer: Self-pay | Admitting: Internal Medicine

## 2021-04-28 DIAGNOSIS — R070 Pain in throat: Secondary | ICD-10-CM

## 2021-04-28 DIAGNOSIS — J3489 Other specified disorders of nose and nasal sinuses: Secondary | ICD-10-CM

## 2021-05-03 ENCOUNTER — Encounter: Payer: Self-pay | Admitting: Cardiovascular Disease

## 2021-05-03 ENCOUNTER — Ambulatory Visit: Payer: Medicare Other | Admitting: Cardiovascular Disease

## 2021-05-03 ENCOUNTER — Other Ambulatory Visit: Payer: Self-pay

## 2021-05-03 VITALS — BP 128/74 | HR 58 | Ht 62.0 in | Wt 177.0 lb

## 2021-05-03 DIAGNOSIS — I350 Nonrheumatic aortic (valve) stenosis: Secondary | ICD-10-CM

## 2021-05-03 DIAGNOSIS — I10 Essential (primary) hypertension: Secondary | ICD-10-CM | POA: Diagnosis not present

## 2021-05-03 DIAGNOSIS — R072 Precordial pain: Secondary | ICD-10-CM | POA: Diagnosis not present

## 2021-05-03 DIAGNOSIS — E782 Mixed hyperlipidemia: Secondary | ICD-10-CM

## 2021-05-03 DIAGNOSIS — R0789 Other chest pain: Secondary | ICD-10-CM

## 2021-05-03 DIAGNOSIS — I6522 Occlusion and stenosis of left carotid artery: Secondary | ICD-10-CM

## 2021-05-03 MED ORDER — METOPROLOL TARTRATE 25 MG PO TABS: 25.0000 mg | ORAL_TABLET | Freq: Once | ORAL | 0 refills | Status: DC

## 2021-05-03 NOTE — Assessment & Plan Note (Signed)
Heather Dawson continues to complain of atypical chest pain.  She did have a low risk Myoview 07/02/2010.  I am going to get a coronary CTA to further evaluate. ?

## 2021-05-03 NOTE — Assessment & Plan Note (Signed)
Her last 2D echocardiogram performed 07/25/2020 revealed normal LV systolic function with a valve area of 1.58 cm? with a peak gradient of 20 mmHg.  This will be repeated on an annual basis. ?

## 2021-05-03 NOTE — Patient Instructions (Addendum)
Medication Instructions:  ?Your physician recommends that you continue on your current medications as directed. Please refer to the Current Medication list given to you today. ? ?*If you need a refill on your cardiac medications before your next appointment, please call your pharmacy* ? ? ?Testing/Procedures: ?Your physician has requested that you have an echocardiogram. Echocardiography is a painless test that uses sound waves to create images of your heart. It provides your doctor with information about the size and shape of your heart and how well your heart?s chambers and valves are working. This procedure takes approximately one hour. There are no restrictions for this procedure. To be done in June. This procedure will be done at [address] ? ? ?Dr. Gwenlyn Found has ordered a CT coronary calcium score.  ? ?Test locations:  ?[facility] ([address] [place], [place] [address]) ?[facility] ([address] [place], [place] [address])  ? ?This is $99 out of pocket. ? ? ?Coronary CalciumScan ?A coronary calcium scan is an imaging test used to look for deposits of calcium and other fatty materials (plaques) in the inner lining of the blood vessels of the heart (coronary arteries). These deposits of calcium and plaques can partly clog and narrow the coronary arteries without producing any symptoms or warning signs. This puts a person at risk for a heart attack. This test can detect these deposits before symptoms develop. ?Tell a health care provider about: ?Any allergies you have. ?All medicines you are taking, including vitamins, herbs, eye drops, creams, and over-the-counter medicines. ?Any problems you or family members have had with anesthetic medicines. ?Any blood disorders you have. ?Any surgeries you have had. ?Any medical conditions you have. ?Whether you are pregnant or may be pregnant. ?What are the risks? ?Generally, this is a safe procedure. However, problems may occur,  including: ?Harm to a pregnant woman and her unborn baby. This test involves the use of radiation. Radiation exposure can be dangerous to a pregnant woman and her unborn baby. If you are pregnant, you generally should not have this procedure done. ?Slight increase in the risk of cancer. This is because of the radiation involved in the test. ?What happens before the procedure? ?No preparation is needed for this procedure. ?What happens during the procedure? ?You will undress and remove any jewelry around your neck or chest. ?You will put on a hospital gown. ?Sticky electrodes will be placed on your chest. The electrodes will be connected to an electrocardiogram (ECG) machine to record a tracing of the electrical activity of your heart. ?A CT scanner will take pictures of your heart. During this time, you will be asked to lie still and hold your breath for 2-3 seconds while a picture of your heart is being taken. ?The procedure may vary among health care providers and hospitals. ?What happens after the procedure? ?You can get dressed. ?You can return to your normal activities. ?It is up to you to get the results of your test. Ask your health care provider, or the department that is doing the test, when your results will be ready. ?Summary ?A coronary calcium scan is an imaging test used to look for deposits of calcium and other fatty materials (plaques) in the inner lining of the blood vessels of the heart (coronary arteries). ?Generally, this is a safe procedure. Tell your health care provider if you are pregnant or may be pregnant. ?No preparation is needed for this procedure. ?A CT scanner will take pictures of  your heart. ?You can return to your normal activities after the scan is done. ?This information is not intended to replace advice given to you by your health care provider. Make sure you discuss any questions you have with your health care provider. ?Document Released: 08/04/2007 Document Revised: 12/26/2015  Document Reviewed: 12/26/2015 ?Elsevier Interactive Patient Education ? 2017 Elsevier Inc. ? ? ? ? ?Follow-Up: ?At [facility], you and your health needs are our priority.  As part of our continuing mission to provide you with exceptional heart care, we have created designated Provider Care Teams.  These Care Teams include your primary Cardiologist (physician) and Advanced Practice Providers (APPs -  Physician Assistants and Nurse Practitioners) who all work together to provide you with the care you need, when you need it. ? ?We recommend signing up for the patient portal called "MyChart".  Sign up information is provided on this After Visit Summary.  MyChart is used to connect with patients for Virtual Visits (Telemedicine).  Patients are able to view lab/test results, encounter notes, upcoming appointments, etc.  Non-urgent messages can be sent to your provider as well.   ?To learn more about what you can do with MyChart, go to [email or web address].   ? ?Your next appointment:   ?12 month(s) ? ?The format for your next appointment:   ?In Person ? ?Provider:   ?Quay Burow, MD ?

## 2021-05-03 NOTE — Assessment & Plan Note (Signed)
History of carotid artery disease status post endarterectomy performed by Dr. Oneida Alar remotely back in 2016.  He follows this by duplex ultrasound. ?

## 2021-05-03 NOTE — Progress Notes (Signed)
? ? ? ?05/03/2021 ?Heather Dawson   ?Jul 04, 1955  ?[id number] ? ?Primary Physician Biagio Borg, MD ?Primary Cardiologist: Lorretta Harp MD [name], [place] ? ?HPI:  Heather Dawson is a 65 y.o.  moderately overweight married Caucasian female mother of 2 children, grandmother and 4 grandchildren referred by Dr. Oneida Alar for cardiovascular clearance prior to elective left carotid endarterectomy.  She is accompanied by her husband Heather Dawson who is also a patient of mine.  Her primary care physician is Dr. [name]. I last saw her in the office is 06/28/2020.  Her cardiac risk factor profile is notable for treated hypertension and hyperlipidemia. Both her parents had myocardial infarctions and are deceased. Her brother had a myocardial infarction as well. 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 She has never had a heart attack. She denies chest pain or shortness of breath. She had carotid Dopplers performed recently that showed high-grade asymptomatic left internal carotid artery stenosis and was referred to Dr. Oneida Alar. She had a Myoview stress test performed 08/12/14 which was low risk with normal ejection fraction. She separately underwent uncomplicated elective left carotid endarterectomy. ?  ?She did have an uncomplicated carotid endarterectomy by Dr. Oneida Alar.  This is followed by duplex ultrasound in the vascular surgical office.  Because of atypical chest pain she had an negative Myoview performed 07/01/2020.  She also has mild aortic stenosis by 2D echo performed 07/25/2020 with a valve area of 1.59 cm?.  She continues to complain of some atypical chest pain. ?  ? ? ?No outpatient medications have been marked as taking for the 05/03/21 encounter (Office Visit) with Lorretta Harp, MD.  ?  ? ?Allergies  ?Allergen Reactions  ? Bactrim [Sulfamethoxazole-Trimethoprim] Nausea And Vomiting  ? Cefuroxime Axetil Swelling  ?   edema/swelling  ? Ciprofloxacin Swelling  ?  Doxycycline Nausea Only  ? Levaquin [Levofloxacin In D5w] Other (See Comments)  ?  GI upset  ? Lipitor [Atorvastatin] Other (See Comments)  ?  Myalgia ?  ? Lyrica [Pregabalin] Other (See Comments)  ?  HA, confusion, swelling  ? Macrodantin [Nitrofurantoin Macrocrystal] Other (See Comments)  ?  GI upset, diarrhea and sob  ? Phentermine Other (See Comments)  ?  All the side effects listed on the paper  ? Gabapentin Rash  ? ? ?Social History  ? ?Socioeconomic History  ? Marital status: Married  ?  Spouse name: Heather Dawson  ? Number of children: Not on file  ? Years of education: Not on file  ? Highest education level: Not on file  ?Occupational History  ? Occupation: retired  ?Tobacco Use  ? Smoking status: Former  ? Smokeless tobacco: Never  ? Tobacco comments:  ?  quit smoking over 30 years ago  ?Vaping Use  ? Vaping Use: Never used  ?Substance and Sexual Activity  ? Alcohol use: No  ? Drug use: No  ? Sexual activity: Not on file  ?Other Topics Concern  ? Not on file  ?Social History Narrative  ? Lives with husband  ? Right Handed  ? Drinks 2-4 cups coffee  ? ?Social Determinants of Health  ? ?Financial Resource Strain: Low Risk   ? Difficulty of Paying Living Expenses: Not hard at all  ?Food Insecurity: No Food Insecurity  ? Worried About Charity fundraiser in the Last Year: Never true  ? Ran Out of Food in the Last Year: Never true  ?Transportation Needs: No  Transportation Needs  ? Lack of Transportation (Medical): No  ? Lack of Transportation (Non-Medical): No  ?Physical Activity: Sufficiently Active  ? Days of Exercise per Week: 5 days  ? Minutes of Exercise per Session: 30 min  ?Stress: No Stress Concern Present  ? Feeling of Stress : Not at all  ?Social Connections: Socially Integrated  ? Frequency of Communication with Friends and Family: More than three times a week  ? Frequency of Social Gatherings with Friends and Family: More than three times a week  ? Attends Religious Services: More than 4 times per year  ?  Active Member of Clubs or Organizations: Yes  ? Attends Archivist Meetings: More than 4 times per year  ? Marital Status: Married  ?Intimate Partner Violence: Not At Risk  ? Fear of Current or Ex-Partner: No  ? Emotionally Abused: No  ? Physically Abused: No  ? Sexually Abused: No  ?  ? ?Review of Systems: ?General: negative for chills, fever, night sweats or weight changes.  ?Cardiovascular: negative for chest pain, dyspnea on exertion, edema, orthopnea, palpitations, paroxysmal nocturnal dyspnea or shortness of breath ?Dermatological: negative for rash ?Respiratory: negative for cough or wheezing ?Urologic: negative for hematuria ?Abdominal: negative for nausea, vomiting, diarrhea, bright red blood per rectum, melena, or hematemesis ?Neurologic: negative for visual changes, syncope, or dizziness ?All other systems reviewed and are otherwise negative except as noted above. ? ? ? ?Blood pressure 128/74, pulse (!) 58, height '5\' 2"'$  (1.575 m), weight 177 lb (80.3 kg), SpO2 99 %.  ?General appearance: alert and no distress ?Neck: no adenopathy, no carotid bruit, no JVD, supple, symmetrical, trachea midline, and thyroid not enlarged, symmetric, no tenderness/mass/nodules ?Lungs: clear to auscultation bilaterally ?Heart: regular rate and rhythm, S1, S2 normal, no murmur, click, rub or gallop ?Extremities: extremities normal, atraumatic, no cyanosis or edema ?Pulses: 2+ and symmetric ?Skin: Skin color, texture, turgor normal. No rashes or lesions ?Neurologic: Grossly normal ? ?EKG sinus bradycardia 58 without ST or T wave changes.  I personally reviewed this EKG. ? ?ASSESSMENT AND PLAN:  ? ?Hyperlipidemia ?History of hyperlipidemia on statin therapy with lipid profile performed 10/18/2020 revealing total cholesterol of 139, LDL 67 and HDL 33. ? ?Essential hypertension ?History of essential hypertension blood pressure measured today at 128/74.  She is on amlodipine, hydralazine, and lisinopril. ? ?Left-sided  carotid artery disease ([place]) ?History of carotid artery disease status post endarterectomy performed by Dr. Oneida Alar remotely back in 2016.  He follows this by duplex ultrasound. ? ?Mild aortic stenosis ?Her last 2D echocardiogram performed 07/25/2020 revealed normal LV systolic function with a valve area of 1.58 cm? with a peak gradient of 20 mmHg.  This will be repeated on an annual basis. ? ?Atypical chest pain ?Ms. Wandrey continues to complain of atypical chest pain.  She did have a low risk Myoview 07/02/2010.  I am going to get a coronary CTA to further evaluate. ? ? ? ? ?Lorretta Harp MD FACP,FACC,FAHA, FSCAI ?05/03/2021 ?2:02 PM ?

## 2021-05-03 NOTE — Assessment & Plan Note (Signed)
History of essential hypertension blood pressure measured today at 128/74.  She is on amlodipine, hydralazine, and lisinopril. ?

## 2021-05-03 NOTE — Assessment & Plan Note (Signed)
History of hyperlipidemia on statin therapy with lipid profile performed 10/18/2020 revealing total cholesterol of 139, LDL 67 and HDL 33. ?

## 2021-05-05 ENCOUNTER — Other Ambulatory Visit: Payer: Self-pay | Admitting: Internal Medicine

## 2021-05-05 ENCOUNTER — Telehealth: Payer: Self-pay | Admitting: Podiatry

## 2021-05-05 NOTE — Telephone Encounter (Signed)
Pt called and wanted to come in today for an injection in her foot. No availability today. Scheduled for Monday to see Dr. Amalia Hailey. She wanted to get a message to you to see if there is anything she can do to help with the pain and get her thru the weekend. She is having a lot of pain on the top, side, and bottom of right foot.  ? ?Please advise ?

## 2021-05-05 NOTE — Telephone Encounter (Signed)
Please refill as per office routine med refill policy (all routine meds to be refilled for 3 mo or monthly (per pt preference) up to one year from last visit, then month to month grace period for 3 mo, then further med refills will have to be denied) ? ?

## 2021-05-08 ENCOUNTER — Ambulatory Visit (INDEPENDENT_AMBULATORY_CARE_PROVIDER_SITE_OTHER): Payer: Medicare Other

## 2021-05-08 ENCOUNTER — Ambulatory Visit: Payer: Medicare Other | Admitting: Podiatry

## 2021-05-08 ENCOUNTER — Other Ambulatory Visit: Payer: Self-pay

## 2021-05-08 DIAGNOSIS — M7671 Peroneal tendinitis, right leg: Secondary | ICD-10-CM | POA: Diagnosis not present

## 2021-05-08 MED ORDER — BETAMETHASONE SOD PHOS & ACET 6 (3-3) MG/ML IJ SUSP
3.0000 mg | Freq: Once | INTRAMUSCULAR | Status: AC
  Administered 2021-05-08: 3 mg via INTRA_ARTICULAR

## 2021-05-08 NOTE — Progress Notes (Signed)
? ?HPI: 66 y.o. female presenting today for evaluation of right lateral foot pain this been going on for few weeks now.  Patient says that she has received injections in the past that have helped significantly alleviate her pain.  She does admit to going barefoot around the house.  She presents for further treatment and evaluation ? ?Past Medical History:  ?Diagnosis Date  ? Abdominal pain, epigastric 12/20/2006  ? ALLERGIC RHINITIS 12/25/2006  ? Allergy   ? ANXIETY 10/16/2006  ? no per pt  ? Arthritis   ? right shoulder  ? BACK PAIN 06/15/2008  ? Blood transfusion without reported diagnosis   ? BRADYCARDIA 05/04/2008  ? C. difficile diarrhea   ? Carpal tunnel syndrome 10/16/2006  ? Cellulitis and abscess of other specified site 06/15/2008  ? CHEST PAIN 09/08/2009  ? Chronic low back pain   ? CIRRHOSIS 10/16/2006  ? CKD (chronic kidney disease)   ? 2008- had Ecoli and caused renal failure, no problems since then  ? CKD (chronic kidney disease) stage 3, GFR 30-59 ml/min (HCC) 10/16/2006  ? Qualifier: Diagnosis of  By: Jenny Reichmann MD, Hunt Oris   ? COMMON MIGRAINE 12/20/2006  ? Complication of anesthesia   ? awake during 2 surgeries  ? CONTUSION, LOWER LEG 09/15/2008  ? DEPRESSION 10/16/2006  ? no per pt  ? Diabetes mellitus without complication ([place])   ? Esophageal reflux 12/25/2006  ? HYPERLIPIDEMIA 12/25/2006  ? Hyperlipidemia   ? HYPERTENSION 08/15/2006  ? Hypertension   ? HYPOTHYROIDISM 08/15/2006  ? Kidney failure, acute ([place]) 2009  ? as a result of severe E Coli infection  ? Left-sided carotid artery disease ([place])   ? MIGRAINE HEADACHE 10/16/2006  ? Obesity 10/16/2006  ? OVARIAN CYST 12/20/2006  ? Psoriasis 01/03/2011  ? SHINGLES, HX OF 12/20/2006  ? Stroke [facility])   ? 3 strokes 2008-no deficits, only on ASA  ? TRANSIENT ISCHEMIC ATTACKS, HX OF 12/20/2006  ? VERTIGO 09/15/2007  ? VITAMIN B12 DEFICIENCY 01/09/2007  ? ? ?Past Surgical History:  ?Procedure Laterality Date  ? ABDOMINAL HYSTERECTOMY  02/2006  ? ANTERIOR CERVICAL  DECOMP/DISCECTOMY FUSION  2005  ? CESAREAN SECTION    ? CHOLECYSTECTOMY    ? COLONOSCOPY    ? ENDARTERECTOMY Left 08/25/2014  ? Procedure: LEFT CAROTID ENDARTERECTOMY WITH HEMASHIELD PATCH ANGIOPLASTY;  Surgeon: Elam Dutch, MD;  Location: [place];  Service: Vascular;  Laterality: Left;  ? NECK SURGERY  2005  ? ROTATOR CUFF REPAIR Right 02/2014  ? Dr Veverly Fells  ? ? ?Allergies  ?Allergen Reactions  ? Bactrim [Sulfamethoxazole-Trimethoprim] Nausea And Vomiting  ? Cefuroxime Axetil Swelling  ?   edema/swelling  ? Ciprofloxacin Swelling  ? Doxycycline Nausea Only  ? Levaquin [Levofloxacin In D5w] Other (See Comments)  ?  GI upset  ? Lipitor [Atorvastatin] Other (See Comments)  ?  Myalgia ?  ? Lyrica [Pregabalin] Other (See Comments)  ?  HA, confusion, swelling  ? Macrodantin [Nitrofurantoin Macrocrystal] Other (See Comments)  ?  GI upset, diarrhea and sob  ? Phentermine Other (See Comments)  ?  All the side effects listed on the paper  ? Gabapentin Rash  ? ?  ?Physical Exam: ?General: The patient is alert and oriented x3 in no acute distress. ? ?Dermatology: Skin is warm, dry and supple bilateral lower extremities. Negative for open lesions or macerations. ? ?Vascular: Palpable pedal pulses bilaterally. Capillary refill within normal limits.  Negative for any significant edema or erythema ? ?Neurological: Light touch and  protective threshold grossly intact ? ?Musculoskeletal Exam: No pedal deformities noted.  There is some pain on palpation along the fifth metatarsal tubercle at the insertion of the peroneal tendon right ? ?Assessment: ?1.  Insertional peroneal tendinitis right ? ? ?Plan of Care:  ?1. Patient evaluated.  ?2.  Injection of 0.5 cc Celestone Soluspan injected along the peroneal tendon sheath right ?3.  Advised against going barefoot.  Recommend good supportive shoes and sneakers ?4.  Return to clinic as needed ?  ?  ?Edrick Kins, DPM ?[place] ? ?Dr. Edrick Kins, DPM  ?  ?[address]                                        ?[place], [place] [address]                ?Office [phone]  ?Fax [phone] ? ? ? ? ?

## 2021-05-10 DIAGNOSIS — H5203 Hypermetropia, bilateral: Secondary | ICD-10-CM | POA: Diagnosis not present

## 2021-05-16 ENCOUNTER — Encounter: Payer: Self-pay | Admitting: Internal Medicine

## 2021-05-16 ENCOUNTER — Ambulatory Visit (INDEPENDENT_AMBULATORY_CARE_PROVIDER_SITE_OTHER): Payer: Medicare Other | Admitting: Internal Medicine

## 2021-05-16 VITALS — BP 120/76 | HR 57 | Temp 98.9°F | Ht 62.0 in | Wt 176.0 lb

## 2021-05-16 DIAGNOSIS — Z0001 Encounter for general adult medical examination with abnormal findings: Secondary | ICD-10-CM

## 2021-05-16 DIAGNOSIS — I10 Essential (primary) hypertension: Secondary | ICD-10-CM

## 2021-05-16 DIAGNOSIS — E538 Deficiency of other specified B group vitamins: Secondary | ICD-10-CM | POA: Diagnosis not present

## 2021-05-16 DIAGNOSIS — E782 Mixed hyperlipidemia: Secondary | ICD-10-CM | POA: Diagnosis not present

## 2021-05-16 DIAGNOSIS — E559 Vitamin D deficiency, unspecified: Secondary | ICD-10-CM | POA: Diagnosis not present

## 2021-05-16 DIAGNOSIS — E1165 Type 2 diabetes mellitus with hyperglycemia: Secondary | ICD-10-CM | POA: Diagnosis not present

## 2021-05-16 DIAGNOSIS — N1832 Chronic kidney disease, stage 3b: Secondary | ICD-10-CM

## 2021-05-16 DIAGNOSIS — H9191 Unspecified hearing loss, right ear: Secondary | ICD-10-CM | POA: Insufficient documentation

## 2021-05-16 LAB — CBC WITH DIFFERENTIAL/PLATELET
Basophils Absolute: 0 10*3/uL (ref 0.0–0.1)
Basophils Relative: 0.4 % (ref 0.0–3.0)
Eosinophils Absolute: 0.2 10*3/uL (ref 0.0–0.7)
Eosinophils Relative: 3.1 % (ref 0.0–5.0)
HCT: 36.6 % (ref 36.0–46.0)
Hemoglobin: 12.3 g/dL (ref 12.0–15.0)
Lymphocytes Relative: 16.2 % (ref 12.0–46.0)
Lymphs Abs: 1 10*3/uL (ref 0.7–4.0)
MCHC: 33.7 g/dL (ref 30.0–36.0)
MCV: 96.7 fl (ref 78.0–100.0)
Monocytes Absolute: 0.5 10*3/uL (ref 0.1–1.0)
Monocytes Relative: 8 % (ref 3.0–12.0)
Neutro Abs: 4.6 10*3/uL (ref 1.4–7.7)
Neutrophils Relative %: 72.3 % (ref 43.0–77.0)
Platelets: 208 10*3/uL (ref 150.0–400.0)
RBC: 3.78 Mil/uL — ABNORMAL LOW (ref 3.87–5.11)
RDW: 13 % (ref 11.5–15.5)
WBC: 6.4 10*3/uL (ref 4.0–10.5)

## 2021-05-16 LAB — URINALYSIS, ROUTINE W REFLEX MICROSCOPIC
Bilirubin Urine: NEGATIVE
Hgb urine dipstick: NEGATIVE
Ketones, ur: NEGATIVE
Leukocytes,Ua: NEGATIVE
Nitrite: NEGATIVE
Specific Gravity, Urine: 1.015 (ref 1.000–1.030)
Total Protein, Urine: NEGATIVE
Urine Glucose: NEGATIVE
Urobilinogen, UA: 0.2 (ref 0.0–1.0)
pH: 5.5 (ref 5.0–8.0)

## 2021-05-16 LAB — HEPATIC FUNCTION PANEL
ALT: 13 U/L (ref 0–35)
AST: 13 U/L (ref 0–37)
Albumin: 4.6 g/dL (ref 3.5–5.2)
Alkaline Phosphatase: 49 U/L (ref 39–117)
Bilirubin, Direct: 0.1 mg/dL (ref 0.0–0.3)
Total Bilirubin: 0.6 mg/dL (ref 0.2–1.2)
Total Protein: 6.9 g/dL (ref 6.0–8.3)

## 2021-05-16 LAB — BASIC METABOLIC PANEL
BUN: 39 mg/dL — ABNORMAL HIGH (ref 6–23)
CO2: 22 mEq/L (ref 19–32)
Calcium: 9.5 mg/dL (ref 8.4–10.5)
Chloride: 106 mEq/L (ref 96–112)
Creatinine, Ser: 1.96 mg/dL — ABNORMAL HIGH (ref 0.40–1.20)
GFR: 26.56 mL/min — ABNORMAL LOW (ref >60.00)
Glucose, Bld: 99 mg/dL (ref 70–99)
Potassium: 4.9 mEq/L (ref 3.5–5.1)
Sodium: 137 mEq/L (ref 135–145)

## 2021-05-16 LAB — LIPID PANEL
Cholesterol: 193 mg/dL (ref 0–200)
HDL: 45.7 mg/dL (ref >39.00)
LDL Cholesterol: 113 mg/dL — ABNORMAL HIGH (ref 0–99)
NonHDL: 147.31
Total CHOL/HDL Ratio: 4
Triglycerides: 171 mg/dL — ABNORMAL HIGH (ref 0.0–149.0)
VLDL: 34.2 mg/dL (ref 0.0–40.0)

## 2021-05-16 LAB — HEMOGLOBIN A1C: Hgb A1c MFr Bld: 6.1 % (ref 4.6–6.5)

## 2021-05-16 LAB — MICROALBUMIN / CREATININE URINE RATIO
Creatinine,U: 90.4 mg/dL
Microalb Creat Ratio: 0.8 mg/g (ref 0.0–30.0)
Microalb, Ur: <0.7 mg/dL (ref 0.0–1.9)

## 2021-05-16 LAB — TSH: TSH: 3.07 u[IU]/mL (ref 0.35–5.50)

## 2021-05-16 LAB — VITAMIN D 25 HYDROXY (VIT D DEFICIENCY, FRACTURES): VITD: 50.43 ng/mL (ref 30.00–100.00)

## 2021-05-16 LAB — VITAMIN B12: Vitamin B-12: 186 pg/mL — ABNORMAL LOW (ref 211–911)

## 2021-05-16 MED ORDER — AMLODIPINE BESYLATE 10 MG PO TABS: ORAL_TABLET | ORAL | 3 refills | Status: DC

## 2021-05-16 MED ORDER — PANTOPRAZOLE SODIUM 40 MG PO TBEC: 40.0000 mg | DELAYED_RELEASE_TABLET | Freq: Every day | ORAL | 3 refills | Status: DC

## 2021-05-16 MED ORDER — LOVASTATIN 40 MG PO TABS: 80.0000 mg | ORAL_TABLET | Freq: Every day | ORAL | 3 refills | Status: DC

## 2021-05-16 MED ORDER — HYDRALAZINE HCL 100 MG PO TABS: 100.0000 mg | ORAL_TABLET | Freq: Three times a day (TID) | ORAL | 11 refills | Status: DC

## 2021-05-16 MED ORDER — LISINOPRIL 20 MG PO TABS
20.0000 mg | ORAL_TABLET | Freq: Every day | ORAL | 3 refills | Status: DC
Start: 2021-05-16 — End: 2021-11-27

## 2021-05-16 MED ORDER — LEVOTHYROXINE SODIUM 75 MCG PO TABS: 75.0000 ug | ORAL_TABLET | Freq: Every day | ORAL | 3 refills | Status: DC

## 2021-05-16 NOTE — Assessment & Plan Note (Signed)
Lab Results  ?Component Value Date  ? CREATININE 1.96 (H) 05/16/2021  ? ?Stable overall, cont to avoid nephrotoxins ? ?

## 2021-05-16 NOTE — Assessment & Plan Note (Addendum)
Lab Results  ?Component Value Date  ? VITAMINB12 186 (L) 05/16/2021  ? ?Low, to start oral replacement - b12 bimonhtly IM ? ?

## 2021-05-16 NOTE — Assessment & Plan Note (Signed)
BP Readings from Last 3 Encounters:  ?05/16/21 120/76  ?05/03/21 128/74  ?03/29/21 112/68  ? ?Stable, pt to continue medical treatment norvasc, apresoline, lisinopril ?

## 2021-05-16 NOTE — Assessment & Plan Note (Signed)
Age and sex appropriate education and counseling updated with regular exercise and diet ?Referrals for preventative services - none needed ?Immunizations addressed - declines shingrix, tdap  ?Smoking counseling  - none needed ?Evidence for depression or other mood disorder - chronic anxiety stable ?Most recent labs reviewed. ?I have personally reviewed and have noted: ?1) the patient's medical and social history ?2) The patient's current medications and supplements ?3) The patient's height, weight, and BMI have been recorded in the chart ? ?

## 2021-05-16 NOTE — Progress Notes (Signed)
Patient ID: Heather Dawson, female   DOB: 1956-01-03, 66 y.o.   MRN: [id number] ? ? ? ?     Chief Complaint:: wellness exam and htn, hld, ckd, b12 deficiency ? ?     HPI:  Heather Dawson is a 66 y.o. female here for wellness exam; declines shingrix, tdap ow up to date ?         ?              Also s/p covid infection x 3 wks, still with fatigue.  S/p coritsone for right foot tendonitis, and also left plantar fasciitis as well doing better.  Saw optho last wk who plans to retire in 2 yrs , Dr Einar Gip.  Also Dr Oneida Alar retired, also due for carotid f/u testing this Thursday.  Providers include cards, renal, GI, orthopedic, podiatry, ent, vascular surgury.Pt denies chest pain, increased sob or doe, wheezing, orthopnea, PND, increased LE swelling, palpitations, dizziness or syncope.   Pt denies polydipsia, polyuria, or new focal neuro s/s.    Pt denies fever, wt loss, night sweats, loss of appetite, or other constitutional symptoms  Denies worsening depressive symptoms, suicidal ideation, or panic; has ongoing anxiety ; not taking B12 shots bimonthly recently, also with 1 wk reduced hearwing right ear without pain, fever, d/c.    ?  ?Wt Readings from Last 3 Encounters:  ?05/16/21 176 lb (79.8 kg)  ?05/03/21 177 lb (80.3 kg)  ?03/29/21 181 lb (82.1 kg)  ? ?BP Readings from Last 3 Encounters:  ?05/16/21 120/76  ?05/03/21 128/74  ?03/29/21 112/68  ? ?Immunization History  ?Administered Date(s) Administered  ? Influenza Split 01/03/2011  ? Influenza Whole 12/28/2005, 01/07/2007, 11/18/2007, 11/18/2008  ? Influenza,inj,Quad PF,6+ Mos 12/30/2012, 11/06/2013, 11/10/2014, 12/20/2015, 11/13/2016, 10/24/2017, 10/31/2018, 03/23/2020, 12/27/2020  ? PFIZER Comirnaty(Gray Top)Covid-19 Tri-Sucrose Vaccine 07/03/2019, 07/24/2019, 02/04/2020  ? Pneumococcal Conjugate-13 05/03/2016  ? Pneumococcal Polysaccharide-23 04/23/2017  ? Td 08/30/2009  ? Zoster Recombinat (Shingrix) 04/23/2017  ? ?There are no preventive care reminders to display for  this patient. ? ?  ? ?Past Medical History:  ?Diagnosis Date  ? Abdominal pain, epigastric 12/20/2006  ? ALLERGIC RHINITIS 12/25/2006  ? Allergy   ? ANXIETY 10/16/2006  ? no per pt  ? Arthritis   ? right shoulder  ? BACK PAIN 06/15/2008  ? Blood transfusion without reported diagnosis   ? BRADYCARDIA 05/04/2008  ? C. difficile diarrhea   ? Carpal tunnel syndrome 10/16/2006  ? Cellulitis and abscess of other specified site 06/15/2008  ? CHEST PAIN 09/08/2009  ? Chronic low back pain   ? CIRRHOSIS 10/16/2006  ? CKD (chronic kidney disease)   ? 2008- had Ecoli and caused renal failure, no problems since then  ? CKD (chronic kidney disease) stage 3, GFR 30-59 ml/min (HCC) 10/16/2006  ? Qualifier: Diagnosis of  By: Jenny Reichmann MD, Hunt Oris   ? COMMON MIGRAINE 12/20/2006  ? Complication of anesthesia   ? awake during 2 surgeries  ? CONTUSION, LOWER LEG 09/15/2008  ? DEPRESSION 10/16/2006  ? no per pt  ? Diabetes mellitus without complication ([place])   ? Esophageal reflux 12/25/2006  ? HYPERLIPIDEMIA 12/25/2006  ? Hyperlipidemia   ? HYPERTENSION 08/15/2006  ? Hypertension   ? HYPOTHYROIDISM 08/15/2006  ? Kidney failure, acute ([place]) 2009  ? as a result of severe E Coli infection  ? Left-sided carotid artery disease ([place])   ? MIGRAINE HEADACHE 10/16/2006  ? Obesity 10/16/2006  ? OVARIAN CYST 12/20/2006  ? Psoriasis 01/03/2011  ?  SHINGLES, HX OF 12/20/2006  ? Stroke [facility])   ? 3 strokes 2008-no deficits, only on ASA  ? TRANSIENT ISCHEMIC ATTACKS, HX OF 12/20/2006  ? VERTIGO 09/15/2007  ? VITAMIN B12 DEFICIENCY 01/09/2007  ? ?Past Surgical History:  ?Procedure Laterality Date  ? ABDOMINAL HYSTERECTOMY  02/2006  ? ANTERIOR CERVICAL DECOMP/DISCECTOMY FUSION  2005  ? CESAREAN SECTION    ? CHOLECYSTECTOMY    ? COLONOSCOPY    ? ENDARTERECTOMY Left 08/25/2014  ? Procedure: LEFT CAROTID ENDARTERECTOMY WITH HEMASHIELD PATCH ANGIOPLASTY;  Surgeon: Elam Dutch, MD;  Location: [place];  Service: Vascular;  Laterality: Left;  ? NECK SURGERY  2005  ? ROTATOR CUFF  REPAIR Right 02/2014  ? Dr Veverly Fells  ? ? reports that she has quit smoking. She has never used smokeless tobacco. She reports that she does not drink alcohol and does not use drugs. ?family history includes Diabetes in her father, sister, and another family member; Heart disease in her brother, brother, father, and mother; Hypertension in her brother, brother and another family member; Lupus in her mother; Raynaud syndrome in her mother; Stroke in her father. ?Allergies  ?Allergen Reactions  ? Bactrim [Sulfamethoxazole-Trimethoprim] Nausea And Vomiting  ? Cefuroxime Axetil Swelling  ?   edema/swelling  ? Ciprofloxacin Swelling  ? Doxycycline Nausea Only  ? Levaquin [Levofloxacin In D5w] Other (See Comments)  ?  GI upset  ? Lipitor [Atorvastatin] Other (See Comments)  ?  Myalgia ?  ? Lyrica [Pregabalin] Other (See Comments)  ?  HA, confusion, swelling  ? Macrodantin [Nitrofurantoin Macrocrystal] Other (See Comments)  ?  GI upset, diarrhea and sob  ? Phentermine Other (See Comments)  ?  All the side effects listed on the paper  ? Gabapentin Rash  ? ?Current Outpatient Medications on File Prior to Visit  ?Medication Sig Dispense Refill  ? acetaminophen (TYLENOL) 500 MG tablet Take 1,000 mg by mouth every 8 (eight) hours as needed (pain).    ? Ascorbic Acid (VITAMIN C) 1000 MG tablet Take 1,000 mg by mouth daily.    ? aspirin EC 81 MG tablet Take 81 mg by mouth daily. Swallow whole.    ? Cholecalciferol (VITAMIN D3) 1000 units CAPS Take 2 capsules (2,000 Units total) by mouth daily. 60 capsule 0  ? clobetasol (TEMOVATE) 0.05 % external solution Apply 1 application topically daily as needed (scalp psoriasis).   0  ? diclofenac sodium (VOLTAREN) 1 % GEL Apply 4 g topically 4 (four) times daily as needed. (Patient taking differently: Apply 4 g topically 4 (four) times daily as needed (pain).) 400 g 11  ? estradiol (ESTRACE) 0.1 MG/GM vaginal cream Place vaginally.    ? levocetirizine (XYZAL) 5 MG tablet Take 5 mg by mouth  every evening.    ? SUMAtriptan (IMITREX) 100 MG tablet TAKE ONE TABLET BY MOUTH AT ONSET OF MIGRAINE OR HEADACHE; MAY REPEAT ONE TABLET IN 2 HOURS IF NEEDED. (Patient taking differently: Take 100 mg by mouth daily as needed for migraine. TAKE ONE TABLET BY MOUTH AT ONSET OF MIGRAINE OR HEADACHE; MAY REPEAT ONE TABLET IN 2 HOURS IF NEEDED.) 10 tablet 2  ? triamcinolone cream (KENALOG) 0.1 % Apply 1 application topically daily.    ? TURMERIC CURCUMIN PO Take 1 tablet by mouth daily.    ? ?No current facility-administered medications on file prior to visit.  ? ?     ROS:  All others reviewed and negative. ? ?Objective  ? ?     PE:  BP 120/76 (BP Location: Left Arm, Patient Position: Sitting, Cuff Size: Large)   Pulse (!) 57   Temp 98.9 ?F (37.2 ?C) (Oral)   Ht '5\' 2"'$  (1.575 m)   Wt 176 lb (79.8 kg)   SpO2 98%   BMI 32.19 kg/m?  ? ?              Constitutional: Pt appears in NAD ?              HENT: Head: NCAT.  ?              Right Ear: External ear normal.   ?              Left Ear: External ear normal.  ?              Eyes: . Pupils are equal, round, and reactive to light. Conjunctivae and EOM are normal ?              Nose: without d/c or deformity ?              Neck: Neck supple. Gross normal ROM ?              Cardiovascular: Normal rate and regular rhythm.   ?              Pulmonary/Chest: Effort normal and breath sounds without rales or wheezing.  ?              Abd:  Soft, NT, ND, + BS, no organomegaly ?              Neurological: Pt is alert. At baseline orientation, motor grossly intact ?              Skin: Skin is warm. No rashes, no other new lesions, LE edema - none ?              Psychiatric: Pt behavior is normal without agitation  ? ?Micro: none ? ?Cardiac tracings I have personally interpreted today:  none ? ?Pertinent Radiological findings (summarize): none  ? ?Lab Results  ?Component Value Date  ? WBC 6.4 05/16/2021  ? HGB 12.3 05/16/2021  ? HCT 36.6 05/16/2021  ? PLT 208.0 05/16/2021  ?  GLUCOSE 99 05/16/2021  ? CHOL 193 05/16/2021  ? TRIG 171.0 (H) 05/16/2021  ? HDL 45.70 05/16/2021  ? LDLDIRECT 140.0 08/05/2018  ? LDLCALC 113 (H) 05/16/2021  ? ALT 13 05/16/2021  ? AST 13 05/16/2021  ? NA 137 03/28/202

## 2021-05-16 NOTE — Assessment & Plan Note (Signed)
Improved s/p irrigation  Ceruminosis is noted.  Wax is removed by syringing and manual debridement. Instructions for home care to prevent wax buildup are given.  

## 2021-05-16 NOTE — Assessment & Plan Note (Signed)
Lab Results  ?Component Value Date  ? LDLCALC 113 (H) 05/16/2021  ? ?Uncontrolled, goal ldl < 70 given ckd, pt to continue current statin lovastatin 40 as declines change for now ? ?

## 2021-05-16 NOTE — Patient Instructions (Addendum)
Please check with insurance about the Shingrix coverage ? ?Your right ear was cleared of wax today ? ?Please continue all other medications as before, and refills have been done if requested. ? ?Please have the pharmacy call with any other refills you may need. ? ?Please continue your efforts at being more active, low cholesterol diet, and weight control. ? ?You are otherwise up to date with prevention measures today. ? ?Please keep your appointments with your specialists as you may have planned ? ?Please go to the LAB at the blood drawing area for the tests to be done ? ?You will be contacted by phone if any changes need to be made immediately.  Otherwise, you will receive a letter about your results with an explanation, but please check with MyChart first. ? ?Please remember to sign up for MyChart if you have not done so, as this will be important to you in the future with finding out test results, communicating by private email, and scheduling acute appointments online when needed. ? ?Please make an Appointment to return in 6 months, or sooner if needed ?

## 2021-05-16 NOTE — Assessment & Plan Note (Signed)
Lab Results  ?Component Value Date  ? HGBA1C 6.1 05/16/2021  ? ?Stable, pt to continue current medical treatment  - diet ? ?

## 2021-05-17 ENCOUNTER — Encounter: Payer: Self-pay | Admitting: Internal Medicine

## 2021-05-17 NOTE — Telephone Encounter (Signed)
Oh no, not at all.  I would not affect any of the testing done ?

## 2021-05-18 ENCOUNTER — Ambulatory Visit (HOSPITAL_COMMUNITY)
Admission: RE | Admit: 2021-05-18 | Discharge: 2021-05-18 | Disposition: A | Discharge status: Home / Self Care | Payer: Medicare Other | Source: Ambulatory Visit | Attending: Physician Assistant | Admitting: Physician Assistant

## 2021-05-18 ENCOUNTER — Ambulatory Visit: Payer: Medicare Other | Admitting: Physician Assistant

## 2021-05-18 VITALS — BP 144/73 | HR 69 | Temp 97.0°F | Resp 18 | Ht 62.0 in | Wt 176.0 lb

## 2021-05-18 DIAGNOSIS — I6523 Occlusion and stenosis of bilateral carotid arteries: Secondary | ICD-10-CM | POA: Insufficient documentation

## 2021-05-18 NOTE — Progress Notes (Signed)
?Office Note  ? ? ? ?CC:  follow up ?Requesting Provider:  Biagio Borg, MD ? ?HPI: Heather Dawson is a 66 y.o. ([birth year]) female who presents for surveillance of carotid artery stenosis.  She underwent left carotid endarterectomy by Dr. Oneida Alar on 08/25/2014 due to asymptomatic high-grade stenosis.  She denies any CVA or TIA since last office visit.  She also denies any current strokelike symptoms including slurring speech, changes in vision, or one-sided weakness.  She is a former smoker.  She is taking aspirin and statin. ? ? ?Past Medical History:  ?Diagnosis Date  ? Abdominal pain, epigastric 12/20/2006  ? ALLERGIC RHINITIS 12/25/2006  ? Allergy   ? ANXIETY 10/16/2006  ? no per pt  ? Arthritis   ? right shoulder  ? BACK PAIN 06/15/2008  ? Blood transfusion without reported diagnosis   ? BRADYCARDIA 05/04/2008  ? C. difficile diarrhea   ? Carpal tunnel syndrome 10/16/2006  ? Cellulitis and abscess of other specified site 06/15/2008  ? CHEST PAIN 09/08/2009  ? Chronic low back pain   ? CIRRHOSIS 10/16/2006  ? CKD (chronic kidney disease)   ? 2008- had Ecoli and caused renal failure, no problems since then  ? CKD (chronic kidney disease) stage 3, GFR 30-59 ml/min (HCC) 10/16/2006  ? Qualifier: Diagnosis of  By: Jenny Reichmann MD, Hunt Oris   ? COMMON MIGRAINE 12/20/2006  ? Complication of anesthesia   ? awake during 2 surgeries  ? CONTUSION, LOWER LEG 09/15/2008  ? DEPRESSION 10/16/2006  ? no per pt  ? Diabetes mellitus without complication ([place])   ? Esophageal reflux 12/25/2006  ? HYPERLIPIDEMIA 12/25/2006  ? Hyperlipidemia   ? HYPERTENSION 08/15/2006  ? Hypertension   ? HYPOTHYROIDISM 08/15/2006  ? Kidney failure, acute ([place]) 2009  ? as a result of severe E Coli infection  ? Left-sided carotid artery disease ([place])   ? MIGRAINE HEADACHE 10/16/2006  ? Obesity 10/16/2006  ? OVARIAN CYST 12/20/2006  ? Psoriasis 01/03/2011  ? SHINGLES, HX OF 12/20/2006  ? Stroke [facility])   ? 3 strokes 2008-no deficits, only on ASA  ? TRANSIENT ISCHEMIC ATTACKS, HX OF  12/20/2006  ? VERTIGO 09/15/2007  ? VITAMIN B12 DEFICIENCY 01/09/2007  ? ? ?Past Surgical History:  ?Procedure Laterality Date  ? ABDOMINAL HYSTERECTOMY  02/2006  ? ANTERIOR CERVICAL DECOMP/DISCECTOMY FUSION  2005  ? CESAREAN SECTION    ? CHOLECYSTECTOMY    ? COLONOSCOPY    ? ENDARTERECTOMY Left 08/25/2014  ? Procedure: LEFT CAROTID ENDARTERECTOMY WITH HEMASHIELD PATCH ANGIOPLASTY;  Surgeon: Elam Dutch, MD;  Location: [place];  Service: Vascular;  Laterality: Left;  ? NECK SURGERY  2005  ? ROTATOR CUFF REPAIR Right 02/2014  ? Dr Veverly Fells  ? ? ?Social History  ? ?Socioeconomic History  ? Marital status: Married  ?  Spouse name: [name]  ? Number of children: Not on file  ? Years of education: Not on file  ? Highest education level: Not on file  ?Occupational History  ? Occupation: retired  ?Tobacco Use  ? Smoking status: Former  ? Smokeless tobacco: Never  ? Tobacco comments:  ?  quit smoking over 30 years ago  ?Vaping Use  ? Vaping Use: Never used  ?Substance and Sexual Activity  ? Alcohol use: No  ? Drug use: No  ? Sexual activity: Not on file  ?Other Topics Concern  ? Not on file  ?Social History Narrative  ? Lives with husband  ? Right Handed  ?  Drinks 2-4 cups coffee  ? ?Social Determinants of Health  ? ?Financial Resource Strain: Low Risk   ? Difficulty of Paying Living Expenses: Not hard at all  ?Food Insecurity: No Food Insecurity  ? Worried About Charity fundraiser in the Last Year: Never true  ? Ran Out of Food in the Last Year: Never true  ?Transportation Needs: No Transportation Needs  ? Lack of Transportation (Medical): No  ? Lack of Transportation (Non-Medical): No  ?Physical Activity: Sufficiently Active  ? Days of Exercise per Week: 5 days  ? Minutes of Exercise per Session: 30 min  ?Stress: No Stress Concern Present  ? Feeling of Stress : Not at all  ?Social Connections: Socially Integrated  ? Frequency of Communication with Friends and Family: More than three times a week  ? Frequency of Social  Gatherings with Friends and Family: More than three times a week  ? Attends Religious Services: More than 4 times per year  ? Active Member of Clubs or Organizations: Yes  ? Attends Archivist Meetings: More than 4 times per year  ? Marital Status: Married  ?Intimate Partner Violence: Not At Risk  ? Fear of Current or Ex-Partner: No  ? Emotionally Abused: No  ? Physically Abused: No  ? Sexually Abused: No  ? ? ?Family History  ?Problem Relation Age of Onset  ? Stroke Father   ? Heart disease Father   ? Diabetes Father   ? Lupus Mother   ? Raynaud syndrome Mother   ? Heart disease Mother   ? Diabetes Sister   ? Heart disease Brother   ? Hypertension Brother   ? Heart disease Brother   ? Hypertension Brother   ? Diabetes Other   ? Hypertension Other   ? Colon cancer Neg Hx   ? Esophageal cancer Neg Hx   ? Rectal cancer Neg Hx   ? Stomach cancer Neg Hx   ? ? ?Current Outpatient Medications  ?Medication Sig Dispense Refill  ? acetaminophen (TYLENOL) 500 MG tablet Take 1,000 mg by mouth every 8 (eight) hours as needed (pain).    ? amLODipine (NORVASC) 10 MG tablet 1 tab by mouth once daily 90 tablet 3  ? Ascorbic Acid (VITAMIN C) 1000 MG tablet Take 1,000 mg by mouth daily.    ? aspirin EC 81 MG tablet Take 81 mg by mouth daily. Swallow whole.    ? Cholecalciferol (VITAMIN D3) 1000 units CAPS Take 2 capsules (2,000 Units total) by mouth daily. 60 capsule 0  ? clobetasol (TEMOVATE) 0.05 % external solution Apply 1 application topically daily as needed (scalp psoriasis).   0  ? diclofenac sodium (VOLTAREN) 1 % GEL Apply 4 g topically 4 (four) times daily as needed. (Patient taking differently: Apply 4 g topically 4 (four) times daily as needed (pain).) 400 g 11  ? estradiol (ESTRACE) 0.1 MG/GM vaginal cream Place vaginally.    ? hydrALAZINE (APRESOLINE) 100 MG tablet Take 1 tablet (100 mg total) by mouth 3 (three) times daily. 90 tablet 11  ? levocetirizine (XYZAL) 5 MG tablet Take 5 mg by mouth every evening.     ? levothyroxine (SYNTHROID) 75 MCG tablet Take 1 tablet (75 mcg total) by mouth daily. 90 tablet 3  ? lisinopril (ZESTRIL) 20 MG tablet Take 1 tablet (20 mg total) by mouth daily. 90 tablet 3  ? lovastatin (MEVACOR) 40 MG tablet Take 2 tablets (80 mg total) by mouth at bedtime. 180 tablet 3  ?  pantoprazole (PROTONIX) 40 MG tablet Take 1 tablet (40 mg total) by mouth daily. 90 tablet 3  ? SUMAtriptan (IMITREX) 100 MG tablet TAKE ONE TABLET BY MOUTH AT ONSET OF MIGRAINE OR HEADACHE; MAY REPEAT ONE TABLET IN 2 HOURS IF NEEDED. (Patient taking differently: Take 100 mg by mouth daily as needed for migraine. TAKE ONE TABLET BY MOUTH AT ONSET OF MIGRAINE OR HEADACHE; MAY REPEAT ONE TABLET IN 2 HOURS IF NEEDED.) 10 tablet 2  ? triamcinolone cream (KENALOG) 0.1 % Apply 1 application topically daily.    ? TURMERIC CURCUMIN PO Take 1 tablet by mouth daily.    ? ?No current facility-administered medications for this visit.  ? ? ?Allergies  ?Allergen Reactions  ? Bactrim [Sulfamethoxazole-Trimethoprim] Nausea And Vomiting  ? Cefuroxime Axetil Swelling  ?   edema/swelling  ? Ciprofloxacin Swelling  ? Doxycycline Nausea Only  ? Levaquin [Levofloxacin In D5w] Other (See Comments)  ?  GI upset  ? Lipitor [Atorvastatin] Other (See Comments)  ?  Myalgia ?  ? Lyrica [Pregabalin] Other (See Comments)  ?  HA, confusion, swelling  ? Macrodantin [Nitrofurantoin Macrocrystal] Other (See Comments)  ?  GI upset, diarrhea and sob  ? Phentermine Other (See Comments)  ?  All the side effects listed on the paper  ? Gabapentin Rash  ? ? ? ?REVIEW OF SYSTEMS:  ? ?'[X]'$  denotes positive finding, '[ ]'$  denotes negative finding ?Cardiac  Comments:  ?Chest pain or chest pressure:    ?Shortness of breath upon exertion:    ?Short of breath when lying flat:    ?Irregular heart rhythm:    ?    ?Vascular    ?Pain in calf, thigh, or hip brought on by ambulation:    ?Pain in feet at night that wakes you up from your sleep:     ?Blood clot in your veins:    ?Leg  swelling:     ?    ?Pulmonary    ?Oxygen at home:    ?Productive cough:     ?Wheezing:     ?    ?Neurologic    ?Sudden weakness in arms or legs:     ?Sudden numbness in arms or legs:     ?Sudden onset of di

## 2021-05-24 ENCOUNTER — Other Ambulatory Visit (HOSPITAL_COMMUNITY): Payer: Medicare Other

## 2021-05-30 DIAGNOSIS — M25512 Pain in left shoulder: Secondary | ICD-10-CM | POA: Diagnosis not present

## 2021-06-07 ENCOUNTER — Other Ambulatory Visit: Payer: Self-pay | Admitting: Internal Medicine

## 2021-06-07 ENCOUNTER — Ambulatory Visit (INDEPENDENT_AMBULATORY_CARE_PROVIDER_SITE_OTHER): Payer: Medicare Other

## 2021-06-07 ENCOUNTER — Encounter: Payer: Self-pay | Admitting: Internal Medicine

## 2021-06-07 ENCOUNTER — Ambulatory Visit (INDEPENDENT_AMBULATORY_CARE_PROVIDER_SITE_OTHER): Payer: Medicare Other | Admitting: Internal Medicine

## 2021-06-07 VITALS — BP 128/70 | HR 83 | Temp 98.2°F | Ht 62.0 in | Wt 180.0 lb

## 2021-06-07 DIAGNOSIS — R051 Acute cough: Secondary | ICD-10-CM | POA: Diagnosis not present

## 2021-06-07 DIAGNOSIS — E1165 Type 2 diabetes mellitus with hyperglycemia: Secondary | ICD-10-CM | POA: Diagnosis not present

## 2021-06-07 DIAGNOSIS — J309 Allergic rhinitis, unspecified: Secondary | ICD-10-CM | POA: Diagnosis not present

## 2021-06-07 DIAGNOSIS — R062 Wheezing: Secondary | ICD-10-CM

## 2021-06-07 DIAGNOSIS — R059 Cough, unspecified: Secondary | ICD-10-CM | POA: Diagnosis not present

## 2021-06-07 DIAGNOSIS — R300 Dysuria: Secondary | ICD-10-CM | POA: Diagnosis not present

## 2021-06-07 DIAGNOSIS — N1832 Chronic kidney disease, stage 3b: Secondary | ICD-10-CM

## 2021-06-07 LAB — CBC WITH DIFFERENTIAL/PLATELET
Basophils Absolute: 0 10*3/uL (ref 0.0–0.1)
Basophils Relative: 0.2 % (ref 0.0–3.0)
Eosinophils Absolute: 0.1 10*3/uL (ref 0.0–0.7)
Eosinophils Relative: 1.4 % (ref 0.0–5.0)
HCT: 39 % (ref 36.0–46.0)
Hemoglobin: 13 g/dL (ref 12.0–15.0)
Lymphocytes Relative: 11.7 % — ABNORMAL LOW (ref 12.0–46.0)
Lymphs Abs: 1.2 10*3/uL (ref 0.7–4.0)
MCHC: 33.3 g/dL (ref 30.0–36.0)
MCV: 97.8 fl (ref 78.0–100.0)
Monocytes Absolute: 0.8 10*3/uL (ref 0.1–1.0)
Monocytes Relative: 7.7 % (ref 3.0–12.0)
Neutro Abs: 8.2 10*3/uL — ABNORMAL HIGH (ref 1.4–7.7)
Neutrophils Relative %: 79 % — ABNORMAL HIGH (ref 43.0–77.0)
Platelets: 253 10*3/uL (ref 150.0–400.0)
RBC: 3.99 Mil/uL (ref 3.87–5.11)
RDW: 13.2 % (ref 11.5–15.5)
WBC: 10.4 10*3/uL (ref 4.0–10.5)

## 2021-06-07 LAB — HEPATIC FUNCTION PANEL
ALT: 13 U/L (ref 0–35)
AST: 14 U/L (ref 0–37)
Albumin: 4.3 g/dL (ref 3.5–5.2)
Alkaline Phosphatase: 54 U/L (ref 39–117)
Bilirubin, Direct: 0.1 mg/dL (ref 0.0–0.3)
Total Bilirubin: 0.5 mg/dL (ref 0.2–1.2)
Total Protein: 7.2 g/dL (ref 6.0–8.3)

## 2021-06-07 LAB — URINALYSIS, ROUTINE W REFLEX MICROSCOPIC
Bilirubin Urine: NEGATIVE
Hgb urine dipstick: NEGATIVE
Ketones, ur: NEGATIVE
Nitrite: POSITIVE — AB
RBC / HPF: NONE SEEN (ref 0–?)
Specific Gravity, Urine: 1.01 (ref 1.000–1.030)
Total Protein, Urine: NEGATIVE
Urine Glucose: NEGATIVE
Urobilinogen, UA: 1 (ref 0.0–1.0)
pH: 6.5 (ref 5.0–8.0)

## 2021-06-07 LAB — BASIC METABOLIC PANEL
BUN: 33 mg/dL — ABNORMAL HIGH (ref 6–23)
CO2: 25 mEq/L (ref 19–32)
Calcium: 9.5 mg/dL (ref 8.4–10.5)
Chloride: 104 mEq/L (ref 96–112)
Creatinine, Ser: 1.89 mg/dL — ABNORMAL HIGH (ref 0.40–1.20)
GFR: 27.73 mL/min — ABNORMAL LOW (ref >60.00)
Glucose, Bld: 110 mg/dL — ABNORMAL HIGH (ref 70–99)
Potassium: 4.5 mEq/L (ref 3.5–5.1)
Sodium: 139 mEq/L (ref 135–145)

## 2021-06-07 LAB — HEMOGLOBIN A1C: Hgb A1c MFr Bld: 6.1 % (ref 4.6–6.5)

## 2021-06-07 MED ORDER — CEFDINIR 300 MG PO CAPS: 300.0000 mg | ORAL_CAPSULE | Freq: Two times a day (BID) | ORAL | 0 refills | Status: DC

## 2021-06-07 MED ORDER — PREDNISONE 10 MG PO TABS: ORAL_TABLET | ORAL | 0 refills | Status: DC

## 2021-06-07 MED ORDER — AZITHROMYCIN 250 MG PO TABS: ORAL_TABLET | ORAL | 1 refills | Status: AC

## 2021-06-07 MED ORDER — NITROFURANTOIN MACROCRYSTAL 50 MG PO CAPS: 50.0000 mg | ORAL_CAPSULE | Freq: Two times a day (BID) | ORAL | 0 refills | Status: DC

## 2021-06-07 MED ORDER — HYDROCODONE BIT-HOMATROP MBR 5-1.5 MG/5ML PO SOLN
5.0000 mL | Freq: Four times a day (QID) | ORAL | 0 refills | Status: AC | PRN
Start: 2021-06-07 — End: 2021-06-17

## 2021-06-07 NOTE — Progress Notes (Signed)
Patient ID: Heather Dawson, female   DOB: [birth year], 66 y.o.   MRN: [id number] ? ? ? ?    Chief Complaint: acutely ill with cough 4 days, also dysuria recurrent, ckd, dm ? ?     HPI:  Heather Dawson is a 66 y.o. female  Here with 4 days acute onset fever, facial pain, pressure, headache, general weakness and malaise, and greenish d/c, with mild ST and cough and wheezing with mild sob, but pt denies chest pain, orthopnea, PND, increased LE swelling, palpitations, dizziness or syncope.   Pt denies polydipsia, polyuria, or new focal neuro s/s.    Pt denies fever, wt loss, night sweats, loss of appetite, or other constitutional symptoms  also incidentally also with 2 days onset mild dysuria with lower abd pain, and frequency, but Denies urinary symptoms such as flank pain, hematuria or n/v, or confusion.  COVID home testing neg 3 days ago.  Also, Does have several wks ongoing nasal allergy symptoms with clearish congestion, itch and sneezing, ?      ?Wt Readings from Last 3 Encounters:  ?06/07/21 180 lb (81.6 kg)  ?05/18/21 176 lb (79.8 kg)  ?05/16/21 176 lb (79.8 kg)  ? ?BP Readings from Last 3 Encounters:  ?06/07/21 128/70  ?05/18/21 (!) 144/73  ?05/16/21 120/76  ? ?      ?Past Medical History:  ?Diagnosis Date  ? Abdominal pain, epigastric 12/20/2006  ? ALLERGIC RHINITIS 12/25/2006  ? Allergy   ? ANXIETY 10/16/2006  ? no per pt  ? Arthritis   ? right shoulder  ? BACK PAIN 06/15/2008  ? Blood transfusion without reported diagnosis   ? BRADYCARDIA 05/04/2008  ? C. difficile diarrhea   ? Carpal tunnel syndrome 10/16/2006  ? Cellulitis and abscess of other specified site 06/15/2008  ? CHEST PAIN 09/08/2009  ? Chronic low back pain   ? CIRRHOSIS 10/16/2006  ? CKD (chronic kidney disease)   ? 2008- had Ecoli and caused renal failure, no problems since then  ? CKD (chronic kidney disease) stage 3, GFR 30-59 ml/min (HCC) 10/16/2006  ? Qualifier: Diagnosis of  By: Jenny Reichmann MD, [place]   ? COMMON MIGRAINE 12/20/2006  ? Complication of  anesthesia   ? awake during 2 surgeries  ? CONTUSION, LOWER LEG 09/15/2008  ? DEPRESSION 10/16/2006  ? no per pt  ? Diabetes mellitus without complication ([place])   ? Esophageal reflux 12/25/2006  ? HYPERLIPIDEMIA 12/25/2006  ? Hyperlipidemia   ? HYPERTENSION 08/15/2006  ? Hypertension   ? HYPOTHYROIDISM 08/15/2006  ? Kidney failure, acute ([place]) 2009  ? as a result of severe E Coli infection  ? Left-sided carotid artery disease ([place])   ? MIGRAINE HEADACHE 10/16/2006  ? Obesity 10/16/2006  ? OVARIAN CYST 12/20/2006  ? Psoriasis 01/03/2011  ? SHINGLES, HX OF 12/20/2006  ? Stroke [facility])   ? 3 strokes 2008-no deficits, only on ASA  ? TRANSIENT ISCHEMIC ATTACKS, HX OF 12/20/2006  ? VERTIGO 09/15/2007  ? VITAMIN B12 DEFICIENCY 01/09/2007  ? ?Past Surgical History:  ?Procedure Laterality Date  ? ABDOMINAL HYSTERECTOMY  02/2006  ? ANTERIOR CERVICAL DECOMP/DISCECTOMY FUSION  2005  ? CESAREAN SECTION    ? CHOLECYSTECTOMY    ? COLONOSCOPY    ? ENDARTERECTOMY Left 08/25/2014  ? Procedure: LEFT CAROTID ENDARTERECTOMY WITH HEMASHIELD PATCH ANGIOPLASTY;  Surgeon: Elam Dutch, MD;  Location: [facility];  Service: Vascular;  Laterality: Left;  ? NECK SURGERY  2005  ? ROTATOR CUFF REPAIR Right 02/2014  ?  Dr Veverly Fells  ? ? reports that she has quit smoking. She has never used smokeless tobacco. She reports that she does not drink alcohol and does not use drugs. ?family history includes Diabetes in her father, sister, and another family member; Heart disease in her brother, brother, father, and mother; Hypertension in her brother, brother and another family member; Lupus in her mother; Raynaud syndrome in her mother; Stroke in her father. ?Allergies  ?Allergen Reactions  ? Bactrim [Sulfamethoxazole-Trimethoprim] Nausea And Vomiting  ? Cefuroxime Axetil Swelling  ?   edema/swelling  ? Ciprofloxacin Swelling  ? Doxycycline Nausea Only  ? Levaquin [Levofloxacin In D5w] Other (See Comments)  ?  GI upset  ? Lipitor [Atorvastatin] Other (See Comments)  ?   Myalgia ?  ? Lyrica [Pregabalin] Other (See Comments)  ?  HA, confusion, swelling  ? Macrodantin [Nitrofurantoin Macrocrystal] Other (See Comments)  ?  GI upset, diarrhea and sob  ? Phentermine Other (See Comments)  ?  All the side effects listed on the paper  ? Gabapentin Rash  ? ?Current Outpatient Medications on File Prior to Visit  ?Medication Sig Dispense Refill  ? acetaminophen (TYLENOL) 500 MG tablet Take 1,000 mg by mouth every 8 (eight) hours as needed (pain).    ? amLODipine (NORVASC) 10 MG tablet 1 tab by mouth once daily 90 tablet 3  ? Ascorbic Acid (VITAMIN C) 1000 MG tablet Take 1,000 mg by mouth daily.    ? aspirin EC 81 MG tablet Take 81 mg by mouth daily. Swallow whole.    ? Cholecalciferol (VITAMIN D3) 1000 units CAPS Take 2 capsules (2,000 Units total) by mouth daily. 60 capsule 0  ? clobetasol (TEMOVATE) 0.05 % external solution Apply 1 application topically daily as needed (scalp psoriasis).   0  ? diclofenac sodium (VOLTAREN) 1 % GEL Apply 4 g topically 4 (four) times daily as needed. (Patient taking differently: Apply 4 g topically 4 (four) times daily as needed (pain).) 400 g 11  ? estradiol (ESTRACE) 0.1 MG/GM vaginal cream Place vaginally.    ? hydrALAZINE (APRESOLINE) 100 MG tablet Take 1 tablet (100 mg total) by mouth 3 (three) times daily. 90 tablet 11  ? levocetirizine (XYZAL) 5 MG tablet Take 5 mg by mouth every evening.    ? levothyroxine (SYNTHROID) 75 MCG tablet Take 1 tablet (75 mcg total) by mouth daily. 90 tablet 3  ? lisinopril (ZESTRIL) 20 MG tablet Take 1 tablet (20 mg total) by mouth daily. 90 tablet 3  ? lovastatin (MEVACOR) 40 MG tablet Take 2 tablets (80 mg total) by mouth at bedtime. 180 tablet 3  ? pantoprazole (PROTONIX) 40 MG tablet Take 1 tablet (40 mg total) by mouth daily. 90 tablet 3  ? SUMAtriptan (IMITREX) 100 MG tablet TAKE ONE TABLET BY MOUTH AT ONSET OF MIGRAINE OR HEADACHE; MAY REPEAT ONE TABLET IN 2 HOURS IF NEEDED. (Patient taking differently: Take 100 mg  by mouth daily as needed for migraine. TAKE ONE TABLET BY MOUTH AT ONSET OF MIGRAINE OR HEADACHE; MAY REPEAT ONE TABLET IN 2 HOURS IF NEEDED.) 10 tablet 2  ? triamcinolone cream (KENALOG) 0.1 % Apply 1 application topically daily.    ? TURMERIC CURCUMIN PO Take 1 tablet by mouth daily.    ? ?No current facility-administered medications on file prior to visit.  ? ?     ROS:  All others reviewed and negative. ? ?Objective  ? ?     PE:  BP 128/70 (BP Location: Left  Arm, Patient Position: Sitting, Cuff Size: Large)   Pulse 83   Temp 98.2 ?F (36.8 ?C) (Oral)   Ht '5\' 2"'$  (1.575 m)   Wt 180 lb (81.6 kg)   SpO2 99%   BMI 32.92 kg/m?  ? ?              Constitutional: Pt appears in NAD, mild ill ?              HENT: Head: NCAT.  ?              Right Ear: External ear normal.   ?              Left Ear: External ear normal.  Bilat tm's with mild erythema.  Max sinus areas mild tender.  Pharynx with mild erythema, no exudat ?              Eyes: . Pupils are equal, round, and reactive to light. Conjunctivae and EOM are normal ?              Nose: without d/c or deformity ?              Neck: Neck supple. Gross normal ROM ?              Cardiovascular: Normal rate and regular rhythm.   ?              Pulmonary/Chest: Effort normal and breath sounds without rales obut with few bilateral wheezing.  ?              Abd:  Soft,mild low mid abd tender, ND, + BS, no organomegaly ?              Neurological: Pt is alert. At baseline orientation, motor grossly intact ?              Skin: Skin is warm. No rashes, no other new lesions, LE edema - trace bilateral ?              Psychiatric: Pt behavior is normal without agitation  ? ?Micro: none ? ?Cardiac tracings I have personally interpreted today:  none ? ?Pertinent Radiological findings (summarize): none  ? ?Lab Results  ?Component Value Date  ? WBC 10.4 06/07/2021  ? HGB 13.0 06/07/2021  ? HCT 39.0 06/07/2021  ? PLT 253.0 06/07/2021  ? GLUCOSE 110 (H) 06/07/2021  ? CHOL 193  05/16/2021  ? TRIG 171.0 (H) 05/16/2021  ? HDL 45.70 05/16/2021  ? LDLDIRECT 140.0 08/05/2018  ? LDLCALC 113 (H) 05/16/2021  ? ALT 13 06/07/2021  ? AST 14 06/07/2021  ? NA 139 06/07/2021  ? K 4.5 06/07/2021  ? CL 104 06/07/2021

## 2021-06-07 NOTE — Patient Instructions (Signed)
Please take all new medication as prescribed - the antibiotic, cough medicine, and prednisone ? ?Please continue all other medications as before, and refills have been done if requested. ? ?Please have the pharmacy call with any other refills you may need. ? ?Please continue your efforts at being more active, low cholesterol diet, and weight control ? ?Please keep your appointments with your specialists as you may have planned ? ?Please go to the XRAY Department in the first floor for the x-ray testing ? ?Please go to the LAB at the blood drawing area for the tests to be done ? ?You will be contacted by phone if any changes need to be made immediately.  Otherwise, you will receive a letter about your results with an explanation, but please check with MyChart first. ? ?Please remember to sign up for MyChart if you have not done so, as this will be important to you in the future with finding out test results, communicating by private email, and scheduling acute appointments online when needed. ? ? ?

## 2021-06-08 ENCOUNTER — Encounter: Payer: Self-pay | Admitting: Internal Medicine

## 2021-06-10 LAB — URINE CULTURE

## 2021-06-11 ENCOUNTER — Encounter: Payer: Self-pay | Admitting: Internal Medicine

## 2021-06-11 DIAGNOSIS — R062 Wheezing: Secondary | ICD-10-CM | POA: Insufficient documentation

## 2021-06-11 NOTE — Assessment & Plan Note (Addendum)
Suspect bronchitis, cant r/o pna, for cxr, for cough med, antibx, and  to f/u any worsening symptoms or concerns ?

## 2021-06-11 NOTE — Assessment & Plan Note (Signed)
Mild with acute onset infectious illness it seems, likely bronchospasm in etiology doubt volume overload today, for prednisone short course taper,  to f/u any worsening symptoms or concerns, declines inhaler prn for now ?

## 2021-06-11 NOTE — Assessment & Plan Note (Signed)
Lab Results  ?Component Value Date  ? HGBA1C 6.1 06/07/2021  ? ?Stable, pt to continue current medical treatment  - diet, wt control ? ?

## 2021-06-11 NOTE — Assessment & Plan Note (Signed)
Mild to mod uncontrolled seasonal - for prednisone taper as above ?

## 2021-06-11 NOTE — Assessment & Plan Note (Signed)
Also for urine studies including culture, but has multiple allergy and may need to consider referral to ED for tx if not covered or not improving with current tx ?

## 2021-06-12 ENCOUNTER — Encounter: Payer: Self-pay | Admitting: Internal Medicine

## 2021-06-13 ENCOUNTER — Encounter: Payer: Self-pay | Admitting: Internal Medicine

## 2021-06-14 ENCOUNTER — Other Ambulatory Visit: Payer: Self-pay | Admitting: Internal Medicine

## 2021-06-14 MED ORDER — FOSFOMYCIN TROMETHAMINE 3 G PO PACK: 3.0000 g | PACK | Freq: Once | ORAL | 0 refills | Status: AC

## 2021-06-19 ENCOUNTER — Encounter: Payer: Self-pay | Admitting: Internal Medicine

## 2021-06-21 ENCOUNTER — Telehealth: Payer: Self-pay | Admitting: Internal Medicine

## 2021-06-21 NOTE — Telephone Encounter (Signed)
Rep w/ [employer] requesting a cb regarding questions for a PA ? ?Phone [phone] option [phone] ? ? ?

## 2021-06-21 NOTE — Telephone Encounter (Signed)
PA submitted to plan via phone. Insurance will provider coverage determination update soon. ?

## 2021-06-26 DIAGNOSIS — I129 Hypertensive chronic kidney disease with stage 1 through stage 4 chronic kidney disease, or unspecified chronic kidney disease: Secondary | ICD-10-CM | POA: Diagnosis not present

## 2021-06-26 DIAGNOSIS — E785 Hyperlipidemia, unspecified: Secondary | ICD-10-CM | POA: Diagnosis not present

## 2021-06-26 DIAGNOSIS — N183 Chronic kidney disease, stage 3 unspecified: Secondary | ICD-10-CM | POA: Diagnosis not present

## 2021-06-26 DIAGNOSIS — N179 Acute kidney failure, unspecified: Secondary | ICD-10-CM | POA: Diagnosis not present

## 2021-06-29 DIAGNOSIS — M6281 Muscle weakness (generalized): Secondary | ICD-10-CM | POA: Diagnosis not present

## 2021-06-29 DIAGNOSIS — M25612 Stiffness of left shoulder, not elsewhere classified: Secondary | ICD-10-CM | POA: Diagnosis not present

## 2021-06-29 DIAGNOSIS — S46012D Strain of muscle(s) and tendon(s) of the rotator cuff of left shoulder, subsequent encounter: Secondary | ICD-10-CM | POA: Diagnosis not present

## 2021-07-06 ENCOUNTER — Ambulatory Visit: Payer: Medicare Other | Admitting: Internal Medicine

## 2021-07-17 ENCOUNTER — Encounter: Payer: Self-pay | Admitting: Internal Medicine

## 2021-07-18 MED ORDER — OZEMPIC (0.25 OR 0.5 MG/DOSE) 2 MG/3ML ~~LOC~~ SOPN: PEN_INJECTOR | SUBCUTANEOUS | 11 refills | Status: DC

## 2021-07-19 ENCOUNTER — Encounter: Payer: Self-pay | Admitting: Podiatry

## 2021-07-19 ENCOUNTER — Ambulatory Visit: Payer: Medicare Other | Admitting: Podiatry

## 2021-07-19 DIAGNOSIS — M722 Plantar fascial fibromatosis: Secondary | ICD-10-CM | POA: Diagnosis not present

## 2021-07-19 MED ORDER — TRIAMCINOLONE ACETONIDE 10 MG/ML IJ SUSP
10.0000 mg | Freq: Once | INTRAMUSCULAR | Status: AC
  Administered 2021-07-19: 10 mg

## 2021-07-19 NOTE — Progress Notes (Signed)
Subjective:   Patient ID: Heather Dawson, female   DOB: 66 y.o.   MRN: [id number]   HPI Patient presents with severe discomfort plantar aspect left heel stating it is bad after periods of sitting and then getting up in the morning neuro   ROS      Objective:  Physical Exam  Vascular status intact with exquisite discomfort of the plantar fascia left at the insertional point tendon calcaneus with inflammation fluid in the medial band     Assessment:  Acute plantar fasciitis left with inflammation fluid of the medial band     Plan:  H&P reviewed condition and discussed long-term stretching and mobilization and placed into night splint with all instructions on usage it was fitted properly and instructed on heat ice therapy and did sterile prep injected the medial band fascia 3 mg Kenalog 5 mg Xylocaine

## 2021-07-20 ENCOUNTER — Telehealth (HOSPITAL_COMMUNITY): Payer: Self-pay | Admitting: Cardiovascular Disease

## 2021-07-20 NOTE — Telephone Encounter (Signed)
Patient cancelled echocardiogram for reason below:  07/20/2021 9:54 AM [id number], Heather Dawson  Cancel Rsn: Patient (patient said she did not need the test, not having any problems at this time)  Order will be removed from the ECHO WQ.

## 2021-07-25 ENCOUNTER — Other Ambulatory Visit (HOSPITAL_COMMUNITY): Payer: Medicare Other

## 2021-07-25 ENCOUNTER — Other Ambulatory Visit: Payer: Medicare Other

## 2021-08-02 ENCOUNTER — Encounter: Payer: Self-pay | Admitting: Podiatry

## 2021-08-02 ENCOUNTER — Ambulatory Visit: Payer: Medicare Other | Admitting: Podiatry

## 2021-08-02 DIAGNOSIS — M722 Plantar fascial fibromatosis: Secondary | ICD-10-CM | POA: Diagnosis not present

## 2021-08-02 MED ORDER — TRIAMCINOLONE ACETONIDE 10 MG/ML IJ SUSP
10.0000 mg | Freq: Once | INTRAMUSCULAR | Status: AC
  Administered 2021-08-02: 10 mg

## 2021-08-02 NOTE — Progress Notes (Signed)
Subjective:   Patient ID: Heather Dawson, female   DOB: 66 y.o.   MRN: [id number]   HPI Patient states been doing pretty well but the left heel has been sore again and I just want to see if we can get it over the top   ROS      Objective:  Physical Exam  Neurovascular status intact area of exquisite discomfort medial side left heel     Assessment:  Acute fasciitis left with inflammation     Plan:  Sterile prep reinjected the fascia 3 mg Kenalog 5 mg Xylocaine advised on supportive shoes reappoint to recheck

## 2021-09-11 ENCOUNTER — Ambulatory Visit (INDEPENDENT_AMBULATORY_CARE_PROVIDER_SITE_OTHER): Payer: Medicare Other | Admitting: Internal Medicine

## 2021-09-11 ENCOUNTER — Encounter: Payer: Self-pay | Admitting: Internal Medicine

## 2021-09-11 DIAGNOSIS — R197 Diarrhea, unspecified: Secondary | ICD-10-CM

## 2021-09-11 DIAGNOSIS — E039 Hypothyroidism, unspecified: Secondary | ICD-10-CM | POA: Diagnosis not present

## 2021-09-11 DIAGNOSIS — E1169 Type 2 diabetes mellitus with other specified complication: Secondary | ICD-10-CM | POA: Diagnosis not present

## 2021-09-11 DIAGNOSIS — I10 Essential (primary) hypertension: Secondary | ICD-10-CM

## 2021-09-11 DIAGNOSIS — E669 Obesity, unspecified: Secondary | ICD-10-CM

## 2021-09-11 MED ORDER — RYBELSUS 3 MG PO TABS: 3.0000 mg | ORAL_TABLET | Freq: Every day | ORAL | 3 refills | Status: DC

## 2021-09-11 NOTE — Assessment & Plan Note (Signed)
Lab Results  Component Value Date   TSH 3.07 05/16/2021   Stable, pt to continue levothyroxine 75 qd

## 2021-09-11 NOTE — Patient Instructions (Addendum)
Please take all new medication as prescribed - the rybelsus 3 mg per day  Please continue all other medications as before, and refills have been done if requested.  Please have the pharmacy call with any other refills you may need.  Please continue your efforts at being more active, low cholesterol diabetic diet, and weight control.  Please keep your appointments with your specialists as you may have planned - Kidney doctor  Please make an Appointment to return in 6 months, or sooner if needed

## 2021-09-11 NOTE — Assessment & Plan Note (Signed)
Severe worsening, for rybelsus 3 mg qd, lower calories

## 2021-09-11 NOTE — Progress Notes (Signed)
Patient ID: Heather Dawson, female   DOB: [birth year], 66 y.o.   MRN: [id number]        Chief Complaint: follow up dm, obesity, diarrhea, htn       HPI:  Heather Dawson is a 66 y.o. female here overall doing ok; Pt denies chest pain, increased sob or doe, wheezing, orthopnea, PND, increased LE swelling, palpitations, dizziness or syncope.   Pt denies polydipsia, polyuria, or new focal neuro s/s.   Denies worsening reflux, abd pain, dysphagia, n/v, or blood, but has had mild persistent "weepy" loose stools with episode of incontinence recently. Spoke with renal but metamucil makes worse. No pain, fever, blood.   Cannot seem to lose wt, in faact continues to regain all she lost when she could be more active, but now has persistent right hip, right lower back, and bilateral feet pain unable to tolerate.  Has regained overall 45 lbs.  [employer] has denies coverage for ozempic.  Conts to follow with Dr Arty Baumgartner renal. Denies hyper or hypo thyroid symptoms such as voice, skin or hair change.       Wt Readings from Last 3 Encounters:  09/11/21 192 lb (87.1 kg)  06/07/21 180 lb (81.6 kg)  05/18/21 176 lb (79.8 kg)   BP Readings from Last 3 Encounters:  09/11/21 118/60  06/07/21 128/70  05/18/21 (!) 144/73         Past Medical History:  Diagnosis Date   Abdominal pain, epigastric 12/20/2006   ALLERGIC RHINITIS 12/25/2006   Allergy    ANXIETY 10/16/2006   no per pt   Arthritis    right shoulder   BACK PAIN 06/15/2008   Blood transfusion without reported diagnosis    BRADYCARDIA 05/04/2008   C. difficile diarrhea    Carpal tunnel syndrome 10/16/2006   Cellulitis and abscess of other specified site 06/15/2008   CHEST PAIN 09/08/2009   Chronic low back pain    CIRRHOSIS 10/16/2006   CKD (chronic kidney disease)    2008- had Ecoli and caused renal failure, no problems since then   CKD (chronic kidney disease) stage 3, GFR 30-59 ml/min ([place]) 10/16/2006   Qualifier: Diagnosis of  By: Jenny Reichmann MD, [place] 89/38/1017   Complication of anesthesia    awake during 2 surgeries   CONTUSION, LOWER LEG 09/15/2008   DEPRESSION 10/16/2006   no per pt   Diabetes mellitus without complication ([place])    Esophageal reflux 12/25/2006   HYPERLIPIDEMIA 12/25/2006   Hyperlipidemia    HYPERTENSION 08/15/2006   Hypertension    HYPOTHYROIDISM 08/15/2006   Kidney failure, acute ([place]) 2009   as a result of severe E Coli infection   Left-sided carotid artery disease ([place])    MIGRAINE HEADACHE 10/16/2006   Obesity 10/16/2006   OVARIAN CYST 12/20/2006   Psoriasis 01/03/2011   SHINGLES, HX OF 12/20/2006   Stroke ([place])    3 strokes 2008-no deficits, only on ASA   TRANSIENT ISCHEMIC ATTACKS, HX OF 12/20/2006   VERTIGO 09/15/2007   VITAMIN B12 DEFICIENCY 01/09/2007   Past Surgical History:  Procedure Laterality Date   ABDOMINAL HYSTERECTOMY  02/2006   ANTERIOR CERVICAL DECOMP/DISCECTOMY FUSION  2005   CESAREAN SECTION     CHOLECYSTECTOMY     COLONOSCOPY     ENDARTERECTOMY Left 08/25/2014   Procedure: LEFT CAROTID ENDARTERECTOMY WITH HEMASHIELD PATCH ANGIOPLASTY;  Surgeon: Elam Dutch, MD;  Location: [facility] OR;  Service: Vascular;  Laterality: Left;   NECK  SURGERY  2005   ROTATOR CUFF REPAIR Right 02/2014   Dr Veverly Fells    reports that she has quit smoking. She has never used smokeless tobacco. She reports that she does not drink alcohol and does not use drugs. family history includes Diabetes in her father, sister, and another family member; Heart disease in her brother, brother, father, and mother; Hypertension in her brother, brother and another family member; Lupus in her mother; Raynaud syndrome in her mother; Stroke in her father. Allergies  Allergen Reactions   Bactrim [Sulfamethoxazole-Trimethoprim] Nausea And Vomiting   Cefuroxime Axetil Swelling     edema/swelling   Ciprofloxacin Swelling   Doxycycline Nausea Only   Levaquin [Levofloxacin In D5w] Other (See Comments)    GI upset   Lipitor  [Atorvastatin] Other (See Comments)    Myalgia    Lyrica [Pregabalin] Other (See Comments)    HA, confusion, swelling   Macrodantin [Nitrofurantoin Macrocrystal] Other (See Comments)    GI upset, diarrhea and sob   Phentermine Other (See Comments)    All the side effects listed on the paper   Gabapentin Rash   Current Outpatient Medications on File Prior to Visit  Medication Sig Dispense Refill   acetaminophen (TYLENOL) 500 MG tablet Take 1,000 mg by mouth every 8 (eight) hours as needed (pain).     amLODipine (NORVASC) 10 MG tablet 1 tab by mouth once daily 90 tablet 3   Ascorbic Acid (VITAMIN C) 1000 MG tablet Take 1,000 mg by mouth daily.     aspirin EC 81 MG tablet Take 81 mg by mouth daily. Swallow whole.     Cholecalciferol (VITAMIN D3) 1000 units CAPS Take 2 capsules (2,000 Units total) by mouth daily. 60 capsule 0   clobetasol (TEMOVATE) 0.05 % external solution Apply 1 application topically daily as needed (scalp psoriasis).   0   diclofenac sodium (VOLTAREN) 1 % GEL Apply 4 g topically 4 (four) times daily as needed. (Patient taking differently: Apply 4 g topically 4 (four) times daily as needed (pain).) 400 g 11   estradiol (ESTRACE) 0.1 MG/GM vaginal cream Place vaginally.     hydrALAZINE (APRESOLINE) 100 MG tablet Take 1 tablet (100 mg total) by mouth 3 (three) times daily. 90 tablet 11   levocetirizine (XYZAL) 5 MG tablet Take 5 mg by mouth every evening.     levothyroxine (SYNTHROID) 75 MCG tablet Take 1 tablet (75 mcg total) by mouth daily. 90 tablet 3   lisinopril (ZESTRIL) 20 MG tablet Take 1 tablet (20 mg total) by mouth daily. 90 tablet 3   lovastatin (MEVACOR) 40 MG tablet Take 2 tablets (80 mg total) by mouth at bedtime. 180 tablet 3   pantoprazole (PROTONIX) 40 MG tablet Take 1 tablet (40 mg total) by mouth daily. 90 tablet 3   SUMAtriptan (IMITREX) 100 MG tablet TAKE ONE TABLET BY MOUTH AT ONSET OF MIGRAINE OR HEADACHE; MAY REPEAT ONE TABLET IN 2 HOURS IF NEEDED.  (Patient taking differently: Take 100 mg by mouth daily as needed for migraine. TAKE ONE TABLET BY MOUTH AT ONSET OF MIGRAINE OR HEADACHE; MAY REPEAT ONE TABLET IN 2 HOURS IF NEEDED.) 10 tablet 2   triamcinolone cream (KENALOG) 0.1 % Apply 1 application topically daily.     TURMERIC CURCUMIN PO Take 1 tablet by mouth daily.     No current facility-administered medications on file prior to visit.        ROS:  All others reviewed and negative.  Objective  PE:  BP 118/60 (BP Location: Right Arm, Patient Position: Sitting, Cuff Size: Large)   Pulse 81   Ht '5\' 2"'$  (1.575 m)   Wt 192 lb (87.1 kg)   SpO2 99%   BMI 35.12 kg/m                 Constitutional: Pt appears in NAD               HENT: Head: NCAT.                Right Ear: External ear normal.                 Left Ear: External ear normal.                Eyes: . Pupils are equal, round, and reactive to light. Conjunctivae and EOM are normal               Nose: without d/c or deformity               Neck: Neck supple. Gross normal ROM               Cardiovascular: Normal rate and regular rhythm.                 Pulmonary/Chest: Effort normal and breath sounds without rales or wheezing.                Abd:  Soft, NT, ND, + BS, no organomegaly               Neurological: Pt is alert. At baseline orientation, motor grossly intact               Skin: Skin is warm. No rashes, no other new lesions, LE edema - none               Psychiatric: Pt behavior is normal without agitation   Micro: none  Cardiac tracings I have personally interpreted today:  none  Pertinent Radiological findings (summarize): none   Lab Results  Component Value Date   WBC 10.4 06/07/2021   HGB 13.0 06/07/2021   HCT 39.0 06/07/2021   PLT 253.0 06/07/2021   GLUCOSE 110 (H) 06/07/2021   CHOL 193 05/16/2021   TRIG 171.0 (H) 05/16/2021   HDL 45.70 05/16/2021   LDLDIRECT 140.0 08/05/2018   LDLCALC 113 (H) 05/16/2021   ALT 13 06/07/2021   AST 14  06/07/2021   NA 139 06/07/2021   K 4.5 06/07/2021   CL 104 06/07/2021   CREATININE 1.89 (H) 06/07/2021   BUN 33 (H) 06/07/2021   CO2 25 06/07/2021   TSH 3.07 05/16/2021   INR 0.99 08/17/2014   HGBA1C 6.1 06/07/2021   MICROALBUR <0.7 05/16/2021   Assessment/Plan:  Heather Dawson is a 65 y.o. White or Caucasian [1] female with  has a past medical history of Abdominal pain, epigastric (12/20/2006), ALLERGIC RHINITIS (12/25/2006), Allergy, ANXIETY (10/16/2006), Arthritis, BACK PAIN (06/15/2008), Blood transfusion without reported diagnosis, BRADYCARDIA (05/04/2008), C. difficile diarrhea, Carpal tunnel syndrome (10/16/2006), Cellulitis and abscess of other specified site (06/15/2008), CHEST PAIN (09/08/2009), Chronic low back pain, CIRRHOSIS (10/16/2006), CKD (chronic kidney disease), CKD (chronic kidney disease) stage 3, GFR 30-59 ml/min (HCC) (10/16/2006), COMMON MIGRAINE (02/54/2706), Complication of anesthesia, CONTUSION, LOWER LEG (09/15/2008), DEPRESSION (10/16/2006), Diabetes mellitus without complication ([place]), Esophageal reflux (12/25/2006), HYPERLIPIDEMIA (12/25/2006), Hyperlipidemia, HYPERTENSION (08/15/2006), Hypertension, HYPOTHYROIDISM (08/15/2006), Kidney failure, acute ([place]) (2009), Left-sided carotid artery disease ([place]), MIGRAINE HEADACHE (  10/16/2006), Obesity (10/16/2006), OVARIAN CYST (12/20/2006), Psoriasis (01/03/2011), SHINGLES, HX OF (12/20/2006), Stroke ([place]), TRANSIENT ISCHEMIC ATTACKS, HX OF (12/20/2006), VERTIGO (09/15/2007), and VITAMIN B12 DEFICIENCY (01/09/2007).  Morbid obesity ([place]) Severe worsening, for rybelsus 3 mg qd, lower calories  Hypothyroidism Lab Results  Component Value Date   TSH 3.07 05/16/2021   Stable, pt to continue levothyroxine 75 qd   Essential hypertension BP Readings from Last 3 Encounters:  09/11/21 118/60  06/07/21 128/70  05/18/21 (!) 144/73   Stable, pt to continue medical treatment norvasc 10 qd, hydralzine 100 tid, lisinopril 20 qd   Diabetes  mellitus type 2 in obese [facility]) Lab Results  Component Value Date   HGBA1C 6.1 06/07/2021   Fair control, pt to start medical treatment rybelsus 3 qd   Diarrhea Mild recurrent, non toxic, benign exam, for immodium prn  Followup: Return in about 6 months (around 03/14/2022).  [name], MD 09/11/2021 8:29 PM [facility]

## 2021-09-11 NOTE — Assessment & Plan Note (Signed)
BP Readings from Last 3 Encounters:  09/11/21 118/60  06/07/21 128/70  05/18/21 (!) 144/73   Stable, pt to continue medical treatment norvasc 10 qd, hydralzine 100 tid, lisinopril 20 qd

## 2021-09-11 NOTE — Assessment & Plan Note (Signed)
Mild recurrent, non toxic, benign exam, for immodium prn

## 2021-09-11 NOTE — Assessment & Plan Note (Signed)
Lab Results  Component Value Date   HGBA1C 6.1 06/07/2021   Fair control, pt to start medical treatment rybelsus 3 qd

## 2021-09-15 ENCOUNTER — Telehealth: Payer: Self-pay | Admitting: Internal Medicine

## 2021-09-15 NOTE — Telephone Encounter (Signed)
Ursala from [employer] called and advised that pt PA has been approved for Semaglutide (RYBELSUS) 3 MG TABS. Approval effective from 09/14/2021-09/15/2022. She stated pt has been notified of the approval and will receive a letter in the mail.   Fyi

## 2021-09-20 ENCOUNTER — Encounter: Payer: Self-pay | Admitting: Internal Medicine

## 2021-10-06 ENCOUNTER — Encounter: Payer: Self-pay | Admitting: Internal Medicine

## 2021-10-06 ENCOUNTER — Ambulatory Visit (INDEPENDENT_AMBULATORY_CARE_PROVIDER_SITE_OTHER): Payer: Medicare Other | Admitting: Internal Medicine

## 2021-10-06 VITALS — BP 128/80 | HR 87 | Temp 98.7°F | Ht 62.0 in | Wt 189.0 lb

## 2021-10-06 DIAGNOSIS — R197 Diarrhea, unspecified: Secondary | ICD-10-CM | POA: Diagnosis not present

## 2021-10-06 DIAGNOSIS — E669 Obesity, unspecified: Secondary | ICD-10-CM | POA: Diagnosis not present

## 2021-10-06 DIAGNOSIS — Z8619 Personal history of other infectious and parasitic diseases: Secondary | ICD-10-CM

## 2021-10-06 DIAGNOSIS — R1013 Epigastric pain: Secondary | ICD-10-CM | POA: Diagnosis not present

## 2021-10-06 DIAGNOSIS — E1169 Type 2 diabetes mellitus with other specified complication: Secondary | ICD-10-CM | POA: Diagnosis not present

## 2021-10-06 DIAGNOSIS — K921 Melena: Secondary | ICD-10-CM

## 2021-10-06 LAB — CBC WITH DIFFERENTIAL/PLATELET
Basophils Absolute: 0 10*3/uL (ref 0.0–0.1)
Basophils Relative: 0.3 % (ref 0.0–3.0)
Eosinophils Absolute: 0.2 10*3/uL (ref 0.0–0.7)
Eosinophils Relative: 2.6 % (ref 0.0–5.0)
HCT: 38.4 % (ref 36.0–46.0)
Hemoglobin: 12.7 g/dL (ref 12.0–15.0)
Lymphocytes Relative: 19.6 % (ref 12.0–46.0)
Lymphs Abs: 1.6 10*3/uL (ref 0.7–4.0)
MCHC: 33 g/dL (ref 30.0–36.0)
MCV: 95.7 fl (ref 78.0–100.0)
Monocytes Absolute: 0.5 10*3/uL (ref 0.1–1.0)
Monocytes Relative: 6.8 % (ref 3.0–12.0)
Neutro Abs: 5.6 10*3/uL (ref 1.4–7.7)
Neutrophils Relative %: 70.7 % (ref 43.0–77.0)
Platelets: 250 10*3/uL (ref 150.0–400.0)
RBC: 4.01 Mil/uL (ref 3.87–5.11)
RDW: 12.7 % (ref 11.5–15.5)
WBC: 8 10*3/uL (ref 4.0–10.5)

## 2021-10-06 LAB — IBC PANEL
Iron: 75 ug/dL (ref 42–145)
Saturation Ratios: 18.3 % — ABNORMAL LOW (ref 20.0–50.0)
TIBC: 410.2 ug/dL (ref 250.0–450.0)
Transferrin: 293 mg/dL (ref 212.0–360.0)

## 2021-10-06 LAB — BASIC METABOLIC PANEL
BUN: 36 mg/dL — ABNORMAL HIGH (ref 6–23)
CO2: 24 mEq/L (ref 19–32)
Calcium: 9.3 mg/dL (ref 8.4–10.5)
Chloride: 106 mEq/L (ref 96–112)
Creatinine, Ser: 2.42 mg/dL — ABNORMAL HIGH (ref 0.40–1.20)
GFR: 20.56 mL/min — ABNORMAL LOW (ref >60.00)
Glucose, Bld: 109 mg/dL — ABNORMAL HIGH (ref 70–99)
Potassium: 4.7 mEq/L (ref 3.5–5.1)
Sodium: 141 mEq/L (ref 135–145)

## 2021-10-06 LAB — LIPID PANEL
Cholesterol: 173 mg/dL (ref 0–200)
HDL: 40.7 mg/dL (ref >39.00)
NonHDL: 132.78
Total CHOL/HDL Ratio: 4
Triglycerides: 247 mg/dL — ABNORMAL HIGH (ref 0.0–149.0)
VLDL: 49.4 mg/dL — ABNORMAL HIGH (ref 0.0–40.0)

## 2021-10-06 LAB — HEPATIC FUNCTION PANEL
ALT: 16 U/L (ref 0–35)
AST: 18 U/L (ref 0–37)
Albumin: 4.6 g/dL (ref 3.5–5.2)
Alkaline Phosphatase: 53 U/L (ref 39–117)
Bilirubin, Direct: 0.1 mg/dL (ref 0.0–0.3)
Total Bilirubin: 0.4 mg/dL (ref 0.2–1.2)
Total Protein: 7 g/dL (ref 6.0–8.3)

## 2021-10-06 LAB — LIPASE: Lipase: 23 U/L (ref 11.0–59.0)

## 2021-10-06 LAB — LDL CHOLESTEROL, DIRECT: Direct LDL: 107 mg/dL

## 2021-10-06 LAB — HEMOGLOBIN A1C: Hgb A1c MFr Bld: 6.3 % (ref 4.6–6.5)

## 2021-10-06 LAB — FERRITIN: Ferritin: 53.3 ng/mL (ref 10.0–291.0)

## 2021-10-06 NOTE — Assessment & Plan Note (Signed)
Small volume on wiping, has f/u appt with Gi, for CBC with labs

## 2021-10-06 NOTE — Progress Notes (Signed)
Patient ID: Heather Dawson, female   DOB: 1955-06-09, 66 y.o.   MRN: [id number]        Chief Complaint: follow up DM with worsening abd pain, diarrhea with accidents, and small blood on wiping       HPI:  Heather Dawson is a 66 y.o. female here with c/o recent UTI April 2023 tx with antibx zpack, since then has had gradually worsening now mod to severe crampy mostly epigastic but also generalized abd pain with diarrhea and foul smelling stools, now with recent increased frequency, severity of pain and several accidents yesterday on a 15 min drive home from taking her grandchild to college.  Has noted some anal pain and small volume BRBPR with wiping yesterday. Called and has appt with Dr Henrene Pastor oct 2023 but not sooner.  Somewhat tearful today.  Denies fever, chills, n/v though did have an episode of burning SSCP last night with lying down despite taking PPI.  Also is taking prn kaopectate and Align probiotic. Lost 4 lbs with current symtoms recently.  Has hx of C diff colitis in past Wt Readings from Last 3 Encounters:  10/06/21 189 lb (85.7 kg)  09/11/21 192 lb (87.1 kg)  06/07/21 180 lb (81.6 kg)   BP Readings from Last 3 Encounters:  10/06/21 128/80  09/11/21 118/60  06/07/21 128/70         Past Medical History:  Diagnosis Date   Abdominal pain, epigastric 12/20/2006   ALLERGIC RHINITIS 12/25/2006   Allergy    ANXIETY 10/16/2006   no per pt   Arthritis    right shoulder   BACK PAIN 06/15/2008   Blood transfusion without reported diagnosis    BRADYCARDIA 05/04/2008   C. difficile diarrhea    Carpal tunnel syndrome 10/16/2006   Cellulitis and abscess of other specified site 06/15/2008   CHEST PAIN 09/08/2009   Chronic low back pain    CIRRHOSIS 10/16/2006   CKD (chronic kidney disease)    2008- had Ecoli and caused renal failure, no problems since then   CKD (chronic kidney disease) stage 3, GFR 30-59 ml/min ([place]) 10/16/2006   Qualifier: Diagnosis of  By: Jenny Reichmann MD, [place]  51/76/1607   Complication of anesthesia    awake during 2 surgeries   CONTUSION, LOWER LEG 09/15/2008   DEPRESSION 10/16/2006   no per pt   Diabetes mellitus without complication ([place])    Esophageal reflux 12/25/2006   HYPERLIPIDEMIA 12/25/2006   Hyperlipidemia    HYPERTENSION 08/15/2006   Hypertension    HYPOTHYROIDISM 08/15/2006   Kidney failure, acute ([place]) 2009   as a result of severe E Coli infection   Left-sided carotid artery disease ([place])    MIGRAINE HEADACHE 10/16/2006   Obesity 10/16/2006   OVARIAN CYST 12/20/2006   Psoriasis 01/03/2011   SHINGLES, HX OF 12/20/2006   Stroke ([place])    3 strokes 2008-no deficits, only on ASA   TRANSIENT ISCHEMIC ATTACKS, HX OF 12/20/2006   VERTIGO 09/15/2007   VITAMIN B12 DEFICIENCY 01/09/2007   Past Surgical History:  Procedure Laterality Date   ABDOMINAL HYSTERECTOMY  02/2006   ANTERIOR CERVICAL DECOMP/DISCECTOMY FUSION  2005   CESAREAN SECTION     CHOLECYSTECTOMY     COLONOSCOPY     ENDARTERECTOMY Left 08/25/2014   Procedure: LEFT CAROTID ENDARTERECTOMY WITH HEMASHIELD PATCH ANGIOPLASTY;  Surgeon: Elam Dutch, MD;  Location: [facility];  Service: Vascular;  Laterality: Left;   NECK SURGERY  2005  ROTATOR CUFF REPAIR Right 02/2014   Dr Veverly Fells    reports that she has quit smoking. She has never used smokeless tobacco. She reports that she does not drink alcohol and does not use drugs. family history includes Diabetes in her father, sister, and another family member; Heart disease in her brother, brother, father, and mother; Hypertension in her brother, brother and another family member; Lupus in her mother; Raynaud syndrome in her mother; Stroke in her father. Allergies  Allergen Reactions   Bactrim [Sulfamethoxazole-Trimethoprim] Nausea And Vomiting   Cefuroxime Axetil Swelling     edema/swelling   Ciprofloxacin Swelling   Doxycycline Nausea Only   Levaquin [Levofloxacin In D5w] Other (See Comments)    GI upset   Lipitor [Atorvastatin]  Other (See Comments)    Myalgia    Lyrica [Pregabalin] Other (See Comments)    HA, confusion, swelling   Macrodantin [Nitrofurantoin Macrocrystal] Other (See Comments)    GI upset, diarrhea and sob   Phentermine Other (See Comments)    All the side effects listed on the paper   Gabapentin Rash   Current Outpatient Medications on File Prior to Visit  Medication Sig Dispense Refill   acetaminophen (TYLENOL) 500 MG tablet Take 1,000 mg by mouth every 8 (eight) hours as needed (pain).     amLODipine (NORVASC) 10 MG tablet 1 tab by mouth once daily 90 tablet 3   Ascorbic Acid (VITAMIN C) 1000 MG tablet Take 1,000 mg by mouth daily.     aspirin EC 81 MG tablet Take 81 mg by mouth daily. Swallow whole.     Cholecalciferol (VITAMIN D3) 1000 units CAPS Take 2 capsules (2,000 Units total) by mouth daily. 60 capsule 0   clobetasol (TEMOVATE) 0.05 % external solution Apply 1 application topically daily as needed (scalp psoriasis).   0   diclofenac sodium (VOLTAREN) 1 % GEL Apply 4 g topically 4 (four) times daily as needed. (Patient taking differently: Apply 4 g topically 4 (four) times daily as needed (pain).) 400 g 11   estradiol (ESTRACE) 0.1 MG/GM vaginal cream Place vaginally.     hydrALAZINE (APRESOLINE) 100 MG tablet Take 1 tablet (100 mg total) by mouth 3 (three) times daily. 90 tablet 11   levocetirizine (XYZAL) 5 MG tablet Take 5 mg by mouth every evening.     levothyroxine (SYNTHROID) 75 MCG tablet Take 1 tablet (75 mcg total) by mouth daily. 90 tablet 3   lisinopril (ZESTRIL) 20 MG tablet Take 1 tablet (20 mg total) by mouth daily. 90 tablet 3   lovastatin (MEVACOR) 40 MG tablet Take 2 tablets (80 mg total) by mouth at bedtime. 180 tablet 3   pantoprazole (PROTONIX) 40 MG tablet Take 1 tablet (40 mg total) by mouth daily. 90 tablet 3   SUMAtriptan (IMITREX) 100 MG tablet TAKE ONE TABLET BY MOUTH AT ONSET OF MIGRAINE OR HEADACHE; MAY REPEAT ONE TABLET IN 2 HOURS IF NEEDED. (Patient taking  differently: Take 100 mg by mouth daily as needed for migraine. TAKE ONE TABLET BY MOUTH AT ONSET OF MIGRAINE OR HEADACHE; MAY REPEAT ONE TABLET IN 2 HOURS IF NEEDED.) 10 tablet 2   triamcinolone cream (KENALOG) 0.1 % Apply 1 application topically daily.     TURMERIC CURCUMIN PO Take 1 tablet by mouth daily.     No current facility-administered medications on file prior to visit.        ROS:  All others reviewed and negative.  Objective  PE:  BP 128/80 (BP Location: Right Arm, Patient Position: Sitting, Cuff Size: Large)   Pulse 87   Temp 98.7 F (37.1 C) (Oral)   Ht 5' 2"  (1.575 m)   Wt 189 lb (85.7 kg)   SpO2 98%   BMI 34.57 kg/m                 Constitutional: Pt appears in NAD               HENT: Head: NCAT.                Right Ear: External ear normal.                 Left Ear: External ear normal.                Eyes: . Pupils are equal, round, and reactive to light. Conjunctivae and EOM are normal               Nose: without d/c or deformity               Neck: Neck supple. Gross normal ROM               Cardiovascular: Normal rate and regular rhythm.                 Pulmonary/Chest: Effort normal and breath sounds without rales or wheezing.                Abd:  Soft,mild diffuse tender, ND, + BS, no organomegaly, no guarding or rebound               Neurological: Pt is alert. At baseline orientation, motor grossly intact               Skin: Skin is warm. No rashes, no other new lesions, LE edema - none               Psychiatric: Pt behavior is normal without agitation, tearful today   Micro: none  Cardiac tracings I have personally interpreted today:  none  Pertinent Radiological findings (summarize): none   Lab Results  Component Value Date   WBC 10.4 06/07/2021   HGB 13.0 06/07/2021   HCT 39.0 06/07/2021   PLT 253.0 06/07/2021   GLUCOSE 110 (H) 06/07/2021   CHOL 193 05/16/2021   TRIG 171.0 (H) 05/16/2021   HDL 45.70 05/16/2021   LDLDIRECT 140.0  08/05/2018   LDLCALC 113 (H) 05/16/2021   ALT 13 06/07/2021   AST 14 06/07/2021   NA 139 06/07/2021   K 4.5 06/07/2021   CL 104 06/07/2021   CREATININE 1.89 (H) 06/07/2021   BUN 33 (H) 06/07/2021   CO2 25 06/07/2021   TSH 3.07 05/16/2021   INR 0.99 08/17/2014   HGBA1C 6.1 06/07/2021   MICROALBUR <0.7 05/16/2021   Assessment/Plan:  Heather Dawson is a 66 y.o. White or Caucasian [1] female with  has a past medical history of Abdominal pain, epigastric (12/20/2006), ALLERGIC RHINITIS (12/25/2006), Allergy, ANXIETY (10/16/2006), Arthritis, BACK PAIN (06/15/2008), Blood transfusion without reported diagnosis, BRADYCARDIA (05/04/2008), C. difficile diarrhea, Carpal tunnel syndrome (10/16/2006), Cellulitis and abscess of other specified site (06/15/2008), CHEST PAIN (09/08/2009), Chronic low back pain, CIRRHOSIS (10/16/2006), CKD (chronic kidney disease), CKD (chronic kidney disease) stage 3, GFR 30-59 ml/min ([facility]) (10/16/2006), COMMON MIGRAINE (16/94/5038), Complication of anesthesia, CONTUSION, LOWER LEG (09/15/2008), DEPRESSION (10/16/2006), Diabetes mellitus without complication ([place]), Esophageal reflux (12/25/2006), HYPERLIPIDEMIA (12/25/2006), Hyperlipidemia, HYPERTENSION (08/15/2006),  Hypertension, HYPOTHYROIDISM (08/15/2006), Kidney failure, acute ([place]) (2009), Left-sided carotid artery disease ([place]), MIGRAINE HEADACHE (10/16/2006), Obesity (10/16/2006), OVARIAN CYST (12/20/2006), Psoriasis (01/03/2011), SHINGLES, HX OF (12/20/2006), Stroke ([place]), TRANSIENT ISCHEMIC ATTACKS, HX OF (12/20/2006), VERTIGO (09/15/2007), and VITAMIN B12 DEFICIENCY (01/09/2007).  Diabetes mellitus type 2 in obese [facility]) Lab Results  Component Value Date   HGBA1C 6.1 06/07/2021   Stable, pt to continue current medical treatment  - diet, wt control, excercise   Abdominal pain, epigastric etilogy unclear, pt to continue PPI, for labs today including cbc, lipase  Diarrhea With hx of c diff and now somewhat similar symptoms - for Gi  panel including c diff pcr, consider vancocin  Hematochezia Small volume on wiping, has f/u appt with Gi, for CBC with labs  Followup: Return in about 6 months (around 04/08/2022).  [name], MD 10/06/2021 10:14 AM [facility]

## 2021-10-06 NOTE — Assessment & Plan Note (Signed)
With hx of c diff and now somewhat similar symptoms - for Gi panel including c diff pcr, consider vancocin

## 2021-10-06 NOTE — Patient Instructions (Signed)

## 2021-10-06 NOTE — Assessment & Plan Note (Signed)
etilogy unclear, pt to continue PPI, for labs today including cbc, lipase

## 2021-10-06 NOTE — Assessment & Plan Note (Signed)
Lab Results  Component Value Date   HGBA1C 6.1 06/07/2021   Stable, pt to continue current medical treatment  - diet, wt control, excercise

## 2021-10-11 ENCOUNTER — Encounter: Payer: Self-pay | Admitting: Internal Medicine

## 2021-10-11 LAB — GI PATHOGEN PANEL BY PCR, STOOL
Adenovirus F40/41: NEGATIVE
Astrovirus: NEGATIVE
C.DIFFICILE TOXIN: NEGATIVE
CRYPTOSPORIDIUM SPECIES: NEGATIVE
Campylobacter species: NEGATIVE
Cyclospora cayetanensis: NEGATIVE
ENTEROAGGREGATIVE E. COLI (EAEC): NEGATIVE
ENTEROPATHOGENIC E. COLI (EPEC): NEGATIVE
ENTEROTOXIGENIC E. COLI (ETEC): NEGATIVE
Entamoeba histolytica: NEGATIVE
GIARDIA: NEGATIVE
Norovirus GI/GII: NEGATIVE
Plesiomonas shigelloides: NEGATIVE
ROTAVIRUS: NEGATIVE
SHIGA TOXIN PRODUCING E. COLI: NEGATIVE
SHIGELLA/ENTEROINVASIVE E. COLI: NEGATIVE
Salmonella species: NEGATIVE
Sapovirus: NEGATIVE
Vibrio cholerae: NEGATIVE
Vibrio species: NEGATIVE
YERSINIA SPECIES: NEGATIVE

## 2021-10-12 ENCOUNTER — Encounter: Payer: Self-pay | Admitting: Internal Medicine

## 2021-10-12 MED ORDER — PANTOPRAZOLE SODIUM 40 MG PO TBEC: 40.0000 mg | DELAYED_RELEASE_TABLET | Freq: Two times a day (BID) | ORAL | 3 refills | Status: DC

## 2021-10-13 ENCOUNTER — Telehealth: Payer: Self-pay | Admitting: Cardiovascular Disease

## 2021-10-13 NOTE — Telephone Encounter (Signed)
Pt c/o of Chest Pain: STAT if CP now or developed within 24 hours  1. Are you having CP right now?  Burning in her chest, might be acid reflux- this morning chest had a little tightness-now it is sore in her chest also at this time having pains between her shoulder blades,   2. Are you experiencing any other symptoms (ex. SOB, nausea, vomiting, sweating)?  Nausea, comes and goes, get tired easy  3. How long have you been experiencing CP?   4. Is your CP continuous or coming and going?  Comes and goes  5. Have you taken Nitroglycerin?  She does not have any, never had any ?

## 2021-10-13 NOTE — Telephone Encounter (Signed)
Sorry I dont have anything else to offer, except I believe she had mentioned she has an appt with GI coming soon.  thanks

## 2021-10-13 NOTE — Telephone Encounter (Signed)
Spoke with patient who states she was having increased epigastric pain with burning into her esophagus. She denies SOB and N/V. Her Protonix was recently increased to BID but she did not start taking it 2 x day until yesterday.   Advised patient to try some OTC antiacids such as Mylanta for immediate symptoms to help reduce the acid and give the Protonix time to take effect and to continue to take it BID. Also advised to avoid foods and beverages that can increase symptoms of reflux with education on items to avoid.   Advised if symptoms worsen or she develops additional symptoms such as SOB, CP or NV to go to ED for evaluation. Patient verbalized understanding.

## 2021-10-18 ENCOUNTER — Telehealth: Payer: Self-pay | Admitting: Internal Medicine

## 2021-10-18 NOTE — Telephone Encounter (Signed)
Inbound call from patient stating that she needs a sooner appointment than 10/11. Patient is having issues with abd pain and weaping bowels. Patient stated that she ate dinner last night and feels like it is just stuck in her stomach. Patient is requesting a call back, please advise.

## 2021-10-18 NOTE — Telephone Encounter (Signed)
Pt states she has been having issues with abd pain after eating, feels like food is just sitting in her epigastric region. Also states she has been having diarrhea. States PCP has done work up for diarrhea for Cdiff and other pathogens and all has been negative. Pt scheduled to see Amy Esterwood PA 9/19@1 :30pm.  Pt aware of appt.

## 2021-10-26 ENCOUNTER — Telehealth: Payer: Self-pay | Admitting: Internal Medicine

## 2021-10-31 ENCOUNTER — Ambulatory Visit: Payer: Medicare Other | Admitting: Internal Medicine

## 2021-11-03 DIAGNOSIS — I129 Hypertensive chronic kidney disease with stage 1 through stage 4 chronic kidney disease, or unspecified chronic kidney disease: Secondary | ICD-10-CM | POA: Diagnosis not present

## 2021-11-03 DIAGNOSIS — K219 Gastro-esophageal reflux disease without esophagitis: Secondary | ICD-10-CM | POA: Diagnosis not present

## 2021-11-03 DIAGNOSIS — N183 Chronic kidney disease, stage 3 unspecified: Secondary | ICD-10-CM | POA: Diagnosis not present

## 2021-11-03 DIAGNOSIS — E039 Hypothyroidism, unspecified: Secondary | ICD-10-CM | POA: Diagnosis not present

## 2021-11-06 ENCOUNTER — Telehealth: Payer: Self-pay | Admitting: Cardiovascular Disease

## 2021-11-06 NOTE — Telephone Encounter (Signed)
Called patient, advised that she has been having some swelling in her legs/feet/and some in her hands. She states she has been having this ongoing issue for about 2 weeks. She states she seen her kidney doctor and they ruled out a few different options and recommended that she contact her cardiologist. Appointment made with Dr.Berry on 11/21/21. She is elevating her legs and they improve, but with activities or walking the swelling returns. She denies SOB/discomfort. She states she went to a doctor visit in August and her most recent visit last week and she had gain about 4 lbs. She does mention having issues with her GI- but she has an appointment with them tomorrow to discuss further. I did advise to keep this appointment and the one with Dr.Berry in October. Her kidney doctor did send her in a fluid pill, she is unaware of which medication or dosage. I did ask her to send this to [place] via mychart so we can update Dr.Berry on this to see if he recommends any other changes.

## 2021-11-06 NOTE — Telephone Encounter (Signed)
FYI. Patient stated nephrologist placed her on chlorthalidone 25 mg daily. Should I add this to her med list?

## 2021-11-06 NOTE — Telephone Encounter (Signed)
Patient sent pt schedule mychart message to the scheduling team of her fluid pill medication. "Fluid pill is Chlorthalidone 25 mg 1 a day for [facility]"

## 2021-11-06 NOTE — Telephone Encounter (Signed)
Pt c/o swelling: STAT is pt has developed SOB within 24 hours  If swelling, where is the swelling located? From knees down   How much weight have you gained and in what time span?  4 lbs in a month   Have you gained 3 pounds in a day or 5 pounds in a week? No   Do you have a log of your daily weights (if so, list)? No   Are you currently taking a fluid pill? Is starting one today  Are you currently SOB? No   Have you traveled recently? No  Patient states she saw [facility] regarding swelling and they ruled out her kidney's, thyroid, and liver so she was advised to f/u with Dr. Gwenlyn Found regarding it. Patient has been scheduled for 10/03. She reports she is picking up a fluid pill today she will start taking per [facility].

## 2021-11-07 ENCOUNTER — Telehealth: Payer: Self-pay | Admitting: Internal Medicine

## 2021-11-07 ENCOUNTER — Other Ambulatory Visit: Payer: Self-pay

## 2021-11-07 ENCOUNTER — Ambulatory Visit: Payer: PPO | Admitting: Physician Assistant

## 2021-11-07 MED ORDER — CHLORTHALIDONE 25 MG PO TABS: 25.0000 mg | ORAL_TABLET | Freq: Every day | ORAL | 1 refills | Status: DC

## 2021-11-07 NOTE — Telephone Encounter (Signed)
Patient called requesting to speak with a nurse her appt today had to be canceled and she is seeking advise.

## 2021-11-07 NOTE — Telephone Encounter (Signed)
Left message for pt that she has been rescheduled to see Carl Best NP 11/23/21 at 1:30pm. Suggested she take her protonix bid and Imodium otc for the diarrhea.

## 2021-11-07 NOTE — Telephone Encounter (Signed)
Patient advised that chlorthalidone 42m daily was added to her medication list. She will contact our clinic when she needs refills.

## 2021-11-09 DIAGNOSIS — M545 Low back pain, unspecified: Secondary | ICD-10-CM | POA: Diagnosis not present

## 2021-11-16 DIAGNOSIS — E785 Hyperlipidemia, unspecified: Secondary | ICD-10-CM | POA: Diagnosis not present

## 2021-11-16 DIAGNOSIS — N183 Chronic kidney disease, stage 3 unspecified: Secondary | ICD-10-CM | POA: Diagnosis not present

## 2021-11-16 DIAGNOSIS — I129 Hypertensive chronic kidney disease with stage 1 through stage 4 chronic kidney disease, or unspecified chronic kidney disease: Secondary | ICD-10-CM | POA: Diagnosis not present

## 2021-11-16 DIAGNOSIS — E039 Hypothyroidism, unspecified: Secondary | ICD-10-CM | POA: Diagnosis not present

## 2021-11-16 DIAGNOSIS — K219 Gastro-esophageal reflux disease without esophagitis: Secondary | ICD-10-CM | POA: Diagnosis not present

## 2021-11-21 ENCOUNTER — Encounter: Payer: Self-pay | Admitting: Cardiovascular Disease

## 2021-11-21 ENCOUNTER — Ambulatory Visit: Payer: PPO | Attending: Cardiovascular Disease | Admitting: Cardiovascular Disease

## 2021-11-21 VITALS — BP 130/72 | HR 57 | Ht 61.0 in | Wt 181.6 lb

## 2021-11-21 DIAGNOSIS — E782 Mixed hyperlipidemia: Secondary | ICD-10-CM

## 2021-11-21 DIAGNOSIS — I10 Essential (primary) hypertension: Secondary | ICD-10-CM

## 2021-11-21 DIAGNOSIS — R0789 Other chest pain: Secondary | ICD-10-CM

## 2021-11-21 DIAGNOSIS — R600 Localized edema: Secondary | ICD-10-CM | POA: Diagnosis not present

## 2021-11-21 DIAGNOSIS — I350 Nonrheumatic aortic (valve) stenosis: Secondary | ICD-10-CM

## 2021-11-21 DIAGNOSIS — I6522 Occlusion and stenosis of left carotid artery: Secondary | ICD-10-CM | POA: Diagnosis not present

## 2021-11-21 NOTE — Assessment & Plan Note (Signed)
History of essential hypertension a blood pressure measured today at 130/72.  She is on amlodipine, chlorthalidone, and lisinopril.

## 2021-11-21 NOTE — Assessment & Plan Note (Signed)
History of aortic stenosis by 2D echo last performed 07/25/2020.  At that time her EF was normal.  She had grade 1 diastolic dysfunction.  Her aortic valve area was 1.58 cm with a peak gradient of 20 mmHg.  We will continue to follow this on an annual basis.

## 2021-11-21 NOTE — Assessment & Plan Note (Signed)
Bilateral lower extremity edema markedly improved with the addition of chlorthalidone.  She now has no significant edema on exam.

## 2021-11-21 NOTE — Patient Instructions (Signed)
  Lab Work:  Your physician recommends that you return for lab work in: 3 MONTHS-FASTING  If you have labs (blood work) drawn today and your tests are completely normal, you will receive your results only by: [employer] (if you have [place]) OR A paper copy in the mail If you have any lab test that is abnormal or we need to change your treatment, we will call you to review the results.   Testing/Procedures: Your physician has requested that you have an echocardiogram. Echocardiography is a painless test that uses sound waves to create images of your heart. It provides your doctor with information about the size and shape of your heart and how well your heart's chambers and valves are working. This procedure takes approximately one hour. There are no restrictions for this procedure. [place], you and your health needs are our priority.  As part of our continuing mission to provide you with exceptional heart care, we have created designated Provider Care Teams.  These Care Teams include your primary Cardiologist (physician) and Advanced Practice Providers (APPs -  Physician Assistants and Nurse Practitioners) who all work together to provide you with the care you need, when you need it.  We recommend signing up for the patient portal called "MyChart".  Sign up information is provided on this After Visit Summary.  MyChart is used to connect with patients for Virtual Visits (Telemedicine).  Patients are able to view lab/test results, encounter notes, upcoming appointments, etc.  Non-urgent messages can be sent to your provider as well.   To learn more about what you can do with MyChart, go to [email or web address].    Your next appointment:   6 month(s)  The format for your next appointment:   In Person  Provider:   APP IN 6 MONTHS AND DR Gwenlyn Found IN [place]

## 2021-11-21 NOTE — Assessment & Plan Note (Signed)
History of hyperlipidemia on lovastatin with recent lipid profile performed 10/06/2021 revealing total cholesterol 173, LDL 113 and HDL of 40.  This represents a significant increase compared to her last lipid profile performed 10/18/2020 at which time her total cholesterol was 139 with LDL 67.  We will recheck a lipid liver profile 3 months.

## 2021-11-21 NOTE — Progress Notes (Signed)
11/21/2021 Heather Dawson   08-17-1955  [id number]  Primary Physician Biagio Borg, MD Primary Cardiologist: Lorretta Harp MD [name], [place]  HPI:  Heather Dawson is a 66 y.o.  moderately overweight married Caucasian female mother of 2 children, grandmother and 4 grandchildren referred by Dr. Oneida Alar for cardiovascular clearance prior to elective left carotid endarterectomy.  She is accompanied by her husband Heather Dawson who is also a patient of mine.  Her primary care physician is Dr. [name]. I last saw her in the office is 05/03/2021.  Her cardiac risk factor profile is notable for treated hypertension and hyperlipidemia. Both her parents had myocardial infarctions and are deceased. Her brother had a myocardial infarction as well. 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 She has never had a heart attack. She denies chest pain or shortness of breath. She had carotid Dopplers performed recently that showed high-grade asymptomatic left internal carotid artery stenosis and was referred to Dr. Oneida Alar. She had a Myoview stress test performed 08/12/14 which was low risk with normal ejection fraction. She separately underwent uncomplicated elective left carotid endarterectomy.   She did have an uncomplicated carotid endarterectomy by Dr. Oneida Alar.  This is followed by duplex ultrasound in the vascular surgical office.  Because of atypical chest pain she had an negative Myoview performed 07/01/2020.  She also has mild aortic stenosis by 2D echo performed 07/25/2020 with a valve area of 1.59 cm.    Since I saw her 6 months ago she continues to do well.  She did have a UTI treated with antibiotics which resulted in significant gastrointestinal complications.  She was complaining of some lower extreme edema improved with addition of chlorthalidone.  She denies chest pain or shortness of breath.   Current Meds  Medication Sig   acetaminophen (TYLENOL)  500 MG tablet Take 1,000 mg by mouth every 8 (eight) hours as needed (pain).   amLODipine (NORVASC) 10 MG tablet 1 tab by mouth once daily   Ascorbic Acid (VITAMIN C) 1000 MG tablet Take 1,000 mg by mouth daily.   aspirin EC 81 MG tablet Take 81 mg by mouth daily. Swallow whole.   chlorthalidone (HYGROTON) 25 MG tablet Take 1 tablet (25 mg total) by mouth daily.   Cholecalciferol (VITAMIN D3) 1000 units CAPS Take 2 capsules (2,000 Units total) by mouth daily.   clobetasol (TEMOVATE) 0.05 % external solution Apply 1 application topically daily as needed (scalp psoriasis).    estradiol (ESTRACE) 0.1 MG/GM vaginal cream Place vaginally.   hydrALAZINE (APRESOLINE) 100 MG tablet Take 1 tablet (100 mg total) by mouth 3 (three) times daily.   levocetirizine (XYZAL) 5 MG tablet Take 5 mg by mouth every evening.   levothyroxine (SYNTHROID) 75 MCG tablet Take 1 tablet (75 mcg total) by mouth daily.   lisinopril (ZESTRIL) 20 MG tablet Take 1 tablet (20 mg total) by mouth daily.   lovastatin (MEVACOR) 40 MG tablet Take 2 tablets (80 mg total) by mouth at bedtime.   pantoprazole (PROTONIX) 40 MG tablet Take 1 tablet (40 mg total) by mouth 2 (two) times daily before a meal.   predniSONE (STERAPRED UNI-PAK 21 TAB) 10 MG (21) TBPK tablet Take 10 mg by mouth as directed.   triamcinolone cream (KENALOG) 0.1 % Apply 1 application topically daily.   TURMERIC CURCUMIN PO Take 1 tablet by mouth daily.     Allergies  Allergen Reactions   Bactrim [  Sulfamethoxazole-Trimethoprim] Nausea And Vomiting   Cefuroxime Axetil Swelling     edema/swelling   Ciprofloxacin Swelling   Doxycycline Nausea Only   Levaquin [Levofloxacin In D5w] Other (See Comments)    GI upset   Lipitor [Atorvastatin] Other (See Comments)    Myalgia    Lyrica [Pregabalin] Other (See Comments)    HA, confusion, swelling   Macrodantin [Nitrofurantoin Macrocrystal] Other (See Comments)    GI upset, diarrhea and sob   Phentermine Other (See  Comments)    All the side effects listed on the paper   Gabapentin Rash    Social History   Socioeconomic History   Marital status: Married    Spouse name: Heather Dawson   Number of children: Not on file   Years of education: Not on file   Highest education level: Not on file  Occupational History   Occupation: retired  Tobacco Use   Smoking status: Former   Smokeless tobacco: Never   Tobacco comments:    quit smoking over 30 years ago  Media planner   Vaping Use: Never used  Substance and Sexual Activity   Alcohol use: No   Drug use: No   Sexual activity: Not on file  Other Topics Concern   Not on file  Social History Narrative   Lives with husband   Right Handed   Drinks 2-4 cups coffee   Social Determinants of Health   Financial Resource Strain: Low Risk  (03/07/2021)   Overall Financial Resource Strain (CARDIA)    Difficulty of Paying Living Expenses: Not hard at all  Food Insecurity: No Food Insecurity (03/07/2021)   Hunger Vital Sign    Worried About Running Out of Food in the Last Year: Never true    Ran Out of Food in the Last Year: Never true  Transportation Needs: No Transportation Needs (03/07/2021)   PRAPARE - Hydrologist (Medical): No    Lack of Transportation (Non-Medical): No  Physical Activity: Sufficiently Active (03/07/2021)   Exercise Vital Sign    Days of Exercise per Week: 5 days    Minutes of Exercise per Session: 30 min  Stress: No Stress Concern Present (03/07/2021)   [place]    Feeling of Stress : Not at all  Social Connections: [place] (03/07/2021)   Social Connection and Isolation Panel [NHANES]    Frequency of Communication with Friends and Family: More than three times a week    Frequency of Social Gatherings with Friends and Family: More than three times a week    Attends Religious Services: More than 4 times per year    Active Member of  [employer] or Organizations: Yes    Attends Music therapist: More than 4 times per year    Marital Status: Married  Human resources officer Violence: Not At Risk (03/07/2021)   Humiliation, Afraid, Rape, and Kick questionnaire    Fear of Current or Ex-Partner: No    Emotionally Abused: No    Physically Abused: No    Sexually Abused: No     Review of Systems: General: negative for chills, fever, night sweats or weight changes.  Cardiovascular: negative for chest pain, dyspnea on exertion, edema, orthopnea, palpitations, paroxysmal nocturnal dyspnea or shortness of breath Dermatological: negative for rash Respiratory: negative for cough or wheezing Urologic: negative for hematuria Abdominal: negative for nausea, vomiting, diarrhea, bright red blood per rectum, melena, or hematemesis Neurologic: negative for visual changes, syncope,  or dizziness All other systems reviewed and are otherwise negative except as noted above.    Blood pressure 130/72, pulse (!) 57, height 5' 1"  (1.549 m), weight 181 lb 9.6 oz (82.4 kg), SpO2 97 %.  General appearance: alert and no distress Neck: no adenopathy, no JVD, supple, symmetrical, trachea midline, thyroid not enlarged, symmetric, no tenderness/mass/nodules, and soft right carotid bruit Lungs: clear to auscultation bilaterally Heart: Soft outflow tract murmur consistent with aortic stenosis. Extremities: extremities normal, atraumatic, no cyanosis or edema Pulses: 2+ and symmetric Skin: Skin color, texture, turgor normal. No rashes or lesions Neurologic: Grossly normal  EKG sinus bradycardia 57 without ST or T wave changes.  Personally reviewed this EKG.  ASSESSMENT AND PLAN:   Hyperlipidemia History of hyperlipidemia on lovastatin with recent lipid profile performed 10/06/2021 revealing total cholesterol 173, LDL 113 and HDL of 40.  This represents a significant increase compared to her last lipid profile performed 10/18/2020 at which time her  total cholesterol was 139 with LDL 67.  We will recheck a lipid liver profile 3 months.  Essential hypertension History of essential hypertension a blood pressure measured today at 130/72.  She is on amlodipine, chlorthalidone, and lisinopril.  Atypical chest pain History of atypical chest pain in the past which she no longer complains of.  She did have a negative Myoview stress test performed 08/12/2014.  Left-sided carotid artery disease ([place]) History of left carotid endarterectomy performed by Dr. Oneida Alar back in 2016.  Her most recent carotid Doppler study performed 05/18/2021 revealed this to be widely patent.  Mild aortic stenosis History of aortic stenosis by 2D echo last performed 07/25/2020.  At that time her EF was normal.  She had grade 1 diastolic dysfunction.  Her aortic valve area was 1.58 cm with a peak gradient of 20 mmHg.  We will continue to follow this on an annual basis.  Bilateral lower extremity edema Bilateral lower extremity edema markedly improved with the addition of chlorthalidone.  She now has no significant edema on exam.     Lorretta Harp MD [facility], [facility] 11/21/2021 8:24 AM

## 2021-11-21 NOTE — Assessment & Plan Note (Signed)
History of left carotid endarterectomy performed by Dr. Oneida Alar back in 2016.  Her most recent carotid Doppler study performed 05/18/2021 revealed this to be widely patent.

## 2021-11-21 NOTE — Assessment & Plan Note (Signed)
History of atypical chest pain in the past which she no longer complains of.  She did have a negative Myoview stress test performed 08/12/2014.

## 2021-11-23 ENCOUNTER — Encounter: Payer: Self-pay | Admitting: Nurse Practitioner

## 2021-11-23 ENCOUNTER — Ambulatory Visit: Payer: PPO | Admitting: Nurse Practitioner

## 2021-11-23 ENCOUNTER — Other Ambulatory Visit (INDEPENDENT_AMBULATORY_CARE_PROVIDER_SITE_OTHER): Payer: PPO

## 2021-11-23 VITALS — BP 110/72 | HR 80 | Ht 61.0 in | Wt 185.0 lb

## 2021-11-23 DIAGNOSIS — R197 Diarrhea, unspecified: Secondary | ICD-10-CM

## 2021-11-23 DIAGNOSIS — R1013 Epigastric pain: Secondary | ICD-10-CM

## 2021-11-23 DIAGNOSIS — K219 Gastro-esophageal reflux disease without esophagitis: Secondary | ICD-10-CM

## 2021-11-23 LAB — CBC WITH DIFFERENTIAL/PLATELET
Basophils Absolute: 0.1 10*3/uL (ref 0.0–0.1)
Basophils Relative: 0.5 % (ref 0.0–3.0)
Eosinophils Absolute: 0.1 10*3/uL (ref 0.0–0.7)
Eosinophils Relative: 0.6 % (ref 0.0–5.0)
HCT: 39.3 % (ref 36.0–46.0)
Hemoglobin: 13 g/dL (ref 12.0–15.0)
Lymphocytes Relative: 7.9 % — ABNORMAL LOW (ref 12.0–46.0)
Lymphs Abs: 1.1 10*3/uL (ref 0.7–4.0)
MCHC: 33.1 g/dL (ref 30.0–36.0)
MCV: 94.7 fl (ref 78.0–100.0)
Monocytes Absolute: 0.8 10*3/uL (ref 0.1–1.0)
Monocytes Relative: 5.9 % (ref 3.0–12.0)
Neutro Abs: 12 10*3/uL — ABNORMAL HIGH (ref 1.4–7.7)
Neutrophils Relative %: 85.1 % — ABNORMAL HIGH (ref 43.0–77.0)
Platelets: 272 10*3/uL (ref 150.0–400.0)
RBC: 4.15 Mil/uL (ref 3.87–5.11)
RDW: 13.1 % (ref 11.5–15.5)
WBC: 14 10*3/uL — ABNORMAL HIGH (ref 4.0–10.5)

## 2021-11-23 LAB — C-REACTIVE PROTEIN: CRP: <1 mg/dL (ref 0.5–20.0)

## 2021-11-23 LAB — BASIC METABOLIC PANEL
BUN: 79 mg/dL — ABNORMAL HIGH (ref 6–23)
CO2: 22 mEq/L (ref 19–32)
Calcium: 8.8 mg/dL (ref 8.4–10.5)
Chloride: 99 mEq/L (ref 96–112)
Creatinine, Ser: 3.24 mg/dL — ABNORMAL HIGH (ref 0.40–1.20)
GFR: 14.48 mL/min — CL (ref >60.00)
Glucose, Bld: 188 mg/dL — ABNORMAL HIGH (ref 70–99)
Potassium: 4.3 mEq/L (ref 3.5–5.1)
Sodium: 133 mEq/L — ABNORMAL LOW (ref 135–145)

## 2021-11-23 LAB — TSH: TSH: 0.25 u[IU]/mL — ABNORMAL LOW (ref 0.35–5.50)

## 2021-11-23 MED ORDER — SUTAB 1479-225-188 MG PO TABS: 1.0000 | ORAL_TABLET | ORAL | 0 refills | Status: DC

## 2021-11-23 NOTE — Progress Notes (Signed)
11/23/2021 Heather Dawson [id number] 05-21-1955   Chief Complaint: Diarrhea   History of Present Illness: Heather Dawson is a 66 year old female with a past medical history of arthritis, anxiety, hypertension, mild aortic stenosis, TIA, carotid artery disease, hypothyroidism, hypothyroidism, CKD stage IV, GERD, C. difficile, acute segmental colitis and adenomatous colon polyps.  She is followed by Dr. Henrene Pastor.  She presents to our office today for further evaluation regarding diarrhea, epigastric pain and acid reflux since June 2023.  She is accompanied by her husband.  She started having nonbloody watery to mud like diarrhea 3 times daily in June which has progressively worsened over the past few weeks.  She was prescribed a Z-Pak 05/2021 for UTI, no further antibiotics since then. She was seen by her PCP and a GI pathogen panel including C.diff toxin 10/09/2021 was negative.  She initially took Kaopectate then switched to taking Imodium as needed.  She is passing 5-6 episodes of diarrhea most days. Sunday 10/1 she had diarrhea 8 times.  Monday 10/2 and Tuesday 10/2 she had 1 episode of diarrhea and took 2 Imodium tablets, no BM since then.  She saw a small amount of bright red blood on the toilet tissue x 1 episode 5 days ago without recurrence. In general, she feels unwell.  She continues to have active acid reflux.  Pantoprazole was increased to 40 mg twice daily about 1 month ago by her PCP.  She complains of having epigastric pain which is worse after eating.  No specific food triggers.  No nausea or vomiting.  She has lost 8 pounds over the past few weeks. She stated she is tired of feeling this way and she wants to schedule an EGD and colonoscopy as soon as possible.  Her most recent colonoscopy was 12/16/2018 which identified one 4 mm tubular adenomatous polyp removed from the sigmoid and diverticulosis to the sigmoid colon otherwise was normal.  She was advised to repeat a colonoscopy in 7 years.  She  underwent an EGD 04/13/2009 which was normal. She reported her father, paternal uncle and paternal grandparent had a "stomach aneurysm".  She developed lower extremity edema 2 to 3 weeks ago and was started on Chlorthalidone per nephrology.  She was seen by her nephrologist Dr. Marval Regal 1 week ago and she reported her GFR was 24 therefore Lisinopril was discontinued at that time.  She had a little dizziness with cramping in her stomach and to her fingers yesterday so she did not take Chlorthalidone today.  She was admitted to the hospital 10/12/2020 with abdominal pain and bloody diarrhea.  Hemoglobin was 12.4 down from 11.5.  WBC 12.2.  She received Zosyn.  C. difficile was negative.  GI pathogen panel was negative.  CTAP showed segmental colitis to the descending colon, differential diagnosis included infectious colitis versus ischemic colitis versus IBD.  She was seen by Dr. Henrene Pastor for posthospital follow-up 11/30/2020 and he says she likely had acute ischemic colitis which resolved.   She stated her current diarrhea and upper abdominal pain are not similar to the symptoms she had prior to her hospital admission 10/12/2020.  Current Outpatient Medications on File Prior to Visit  Medication Sig Dispense Refill   acetaminophen (TYLENOL) 500 MG tablet Take 1,000 mg by mouth every 8 (eight) hours as needed (pain).     amLODipine (NORVASC) 10 MG tablet 1 tab by mouth once daily 90 tablet 3   Ascorbic Acid (VITAMIN C) 1000 MG tablet Take  1,000 mg by mouth daily.     aspirin EC 81 MG tablet Take 81 mg by mouth daily. Swallow whole.     chlorthalidone (HYGROTON) 25 MG tablet Take 1 tablet (25 mg total) by mouth daily. 90 tablet 1   Cholecalciferol (VITAMIN D3) 1000 units CAPS Take 2 capsules (2,000 Units total) by mouth daily. 60 capsule 0   clobetasol (TEMOVATE) 0.05 % external solution Apply 1 application topically daily as needed (scalp psoriasis).   0   estradiol (ESTRACE) 0.1 MG/GM vaginal cream Place  vaginally.     hydrALAZINE (APRESOLINE) 100 MG tablet Take 1 tablet (100 mg total) by mouth 3 (three) times daily. 90 tablet 11   levocetirizine (XYZAL) 5 MG tablet Take 5 mg by mouth every evening.     levothyroxine (SYNTHROID) 75 MCG tablet Take 1 tablet (75 mcg total) by mouth daily. 90 tablet 3   lisinopril (ZESTRIL) 20 MG tablet Take 1 tablet (20 mg total) by mouth daily. 90 tablet 3   lovastatin (MEVACOR) 40 MG tablet Take 2 tablets (80 mg total) by mouth at bedtime. 180 tablet 3   pantoprazole (PROTONIX) 40 MG tablet Take 1 tablet (40 mg total) by mouth 2 (two) times daily before a meal. 180 tablet 3   predniSONE (STERAPRED UNI-PAK 21 TAB) 10 MG (21) TBPK tablet Take 10 mg by mouth as directed.     triamcinolone cream (KENALOG) 0.1 % Apply 1 application topically daily.     TURMERIC CURCUMIN PO Take 1 tablet by mouth daily.     diclofenac sodium (VOLTAREN) 1 % GEL Apply 4 g topically 4 (four) times daily as needed. (Patient not taking: Reported on 11/21/2021) 400 g 11   SUMAtriptan (IMITREX) 100 MG tablet TAKE ONE TABLET BY MOUTH AT ONSET OF MIGRAINE OR HEADACHE; MAY REPEAT ONE TABLET IN 2 HOURS IF NEEDED. (Patient not taking: Reported on 11/21/2021) 10 tablet 2   No current facility-administered medications on file prior to visit.   Allergies  Allergen Reactions   Bactrim [Sulfamethoxazole-Trimethoprim] Nausea And Vomiting   Cefuroxime Axetil Swelling     edema/swelling   Ciprofloxacin Swelling   Doxycycline Nausea Only   Levaquin [Levofloxacin In D5w] Other (See Comments)    GI upset   Lipitor [Atorvastatin] Other (See Comments)    Myalgia    Lyrica [Pregabalin] Other (See Comments)    HA, confusion, swelling   Macrodantin [Nitrofurantoin Macrocrystal] Other (See Comments)    GI upset, diarrhea and sob   Phentermine Other (See Comments)    All the side effects listed on the paper   Gabapentin Rash    Current Medications, Allergies, Past Medical History, Past Surgical  History, Family History and Social History were reviewed in [employer] record.  Review of Systems:   Constitutional: + 7lbs weight loss.  Respiratory: Negative for shortness of breath.   Cardiovascular: + Leg swelling. Negative for chest pain or palpitations. Gastrointestinal: See HPI.  Musculoskeletal: Negative for back pain or muscle aches.  Neurological: Negative for dizziness, headaches or paresthesias.   Physical Exam: BP 110/72   Pulse 80   Ht 5' 1"  (1.549 m)   Wt 185 lb (83.9 kg)   BMI 34.96 kg/m  Wt Readings from Last 3 Encounters:  11/23/21 185 lb (83.9 kg)  11/21/21 181 lb 9.6 oz (82.4 kg)  10/06/21 189 lb (85.7 kg)    General: 66 year old female no acute distress. Head: Normocephalic and atraumatic. Eyes: No scleral icterus. Conjunctiva pink . Ears:  Normal auditory acuity. Mouth: Dentition intact. No ulcers or lesions.  Lungs: Clear throughout to auscultation. Heart: Regular rate and rhythm.  Soft systolic murmur.  No rub. Abdomen: Soft, nondistended. Epigastric tenderness without rebound or guarding. No masses or hepatomegaly. Normal bowel sounds x 4 quadrants.  Rectal: Deferred.  Musculoskeletal: Symmetrical with no gross deformities. Extremities: No edema. Neurological: Alert oriented x 4. No focal deficits.  Psychological: Alert and cooperative. Normal mood and affect  Assessment and Recommendations:  31) 66 year old female with a history of ischemic colitis and C. Diff with diarrhea since 07/2021. Negative GI pathogen panel 10/09/2021.  She last took Imodium 10/3, no BM since then. -Hold Imodium -C. difficile PCR with next loose stool -CBC, TSH, BMP, TTG, IgA -Push fluids, bland diet -Diagnostic colonoscopy to rule out microscopic colitis vs IBD, low suspicion for recurrent ischemic colitis at this time.  Patient and spouse informed I will consult with Dr. Henrene Pastor prior to the patient proceeding with a colonoscopy.   2) GERD, worsening  reflux symptoms and epigastric pain since 07/2021.  Pantoprazole was increased from 27m QD to bid one month ago.  -EGD benefits and risks discussed including risk with sedation, risk of bleeding, perforation and infection  -I discussed decreasing PPI to once daily if her renal status continues to worsen  3) CKD stage IV, patient reported GFR was 24 one week ago on Lisinopril was discontinued by her nephrologist Dr. CMarval Regal  -BMP as ordered above -Follow-up with nephrologist   ADDENDUM: Today's lab results showed a creatinine level 3.24 with a GFR 14.48.  Sodium 133.  WBC 14.0.  Hemoglobin 13.0.  TSH 0.25.  CRP less than 1.  I called Mrs. Sligar and discussed the above lab results.  I called her nephrologist Dr. CMarval Regaland we discussed her creatinine level of 3.24 and GFR 14.48.  Dr. CMarval Regalstated his office will contact the patient to schedule IV NS infusion tomorrow.   If diarrhea recurs, she will complete the C. difficile stool test ASAP.  Consider empiric treatment with vancomycin with WBC count 14.0.  She will call me tomorrow with an update and to verify if diarrhea recurred.  Mrs. Cancelliere will follow-up with her PCP to discuss reducing her Levothyroxine dose secondary to TSH level of 0.25.  She was instructed to go to the emergency room if she develops severe abdominal pain, bloody diarrhea or decreased urine output.

## 2021-11-23 NOTE — Patient Instructions (Signed)
Your provider has requested that you go to the basement level for lab work before leaving today. Press "B" on the elevator. The lab is located at the first door on the left as you exit the elevator.  Due to recent changes in healthcare laws, you may see the results of your imaging and laboratory studies on MyChart before your provider has had a chance to review them.  We understand that in some cases there may be results that are confusing or concerning to you. Not all laboratory results come back in the same time frame and the provider may be waiting for multiple results in order to interpret others.  Please give [place] 48 hours in order for your provider to thoroughly review all the results before contacting the office for clarification of your results.   You have been scheduled for an endoscopy and colonoscopy. Please follow the written instructions given to you at your visit today. Please pick up your prep supplies at the pharmacy within the next 1-3 days. If you use inhalers (even only as needed), please bring them with you on the day of your procedure.  PUSH FLUIDS AND FOLLOW A BLAND DIET.  I appreciate the opportunity to care for you. Carl Best, CRNP

## 2021-11-24 ENCOUNTER — Inpatient Hospital Stay (HOSPITAL_BASED_OUTPATIENT_CLINIC_OR_DEPARTMENT_OTHER)
Admission: EM | Admit: 2021-11-24 | Discharge: 2021-11-27 | DRG: 684 | Disposition: A | Discharge status: Home / Self Care | Payer: PPO | Attending: Internal Medicine | Admitting: Internal Medicine

## 2021-11-24 ENCOUNTER — Other Ambulatory Visit: Payer: Self-pay

## 2021-11-24 ENCOUNTER — Encounter (HOSPITAL_COMMUNITY): Payer: Self-pay

## 2021-11-24 ENCOUNTER — Encounter (HOSPITAL_BASED_OUTPATIENT_CLINIC_OR_DEPARTMENT_OTHER): Payer: Self-pay

## 2021-11-24 ENCOUNTER — Telehealth: Payer: Self-pay | Admitting: Nurse Practitioner

## 2021-11-24 DIAGNOSIS — Z87891 Personal history of nicotine dependence: Secondary | ICD-10-CM | POA: Diagnosis not present

## 2021-11-24 DIAGNOSIS — Z6833 Body mass index (BMI) 33.0-33.9, adult: Secondary | ICD-10-CM | POA: Diagnosis not present

## 2021-11-24 DIAGNOSIS — E1169 Type 2 diabetes mellitus with other specified complication: Secondary | ICD-10-CM | POA: Diagnosis present

## 2021-11-24 DIAGNOSIS — Z833 Family history of diabetes mellitus: Secondary | ICD-10-CM

## 2021-11-24 DIAGNOSIS — K449 Diaphragmatic hernia without obstruction or gangrene: Secondary | ICD-10-CM | POA: Diagnosis present

## 2021-11-24 DIAGNOSIS — Z8249 Family history of ischemic heart disease and other diseases of the circulatory system: Secondary | ICD-10-CM | POA: Diagnosis not present

## 2021-11-24 DIAGNOSIS — Z87440 Personal history of urinary (tract) infections: Secondary | ICD-10-CM

## 2021-11-24 DIAGNOSIS — R1013 Epigastric pain: Secondary | ICD-10-CM | POA: Diagnosis not present

## 2021-11-24 DIAGNOSIS — N184 Chronic kidney disease, stage 4 (severe): Secondary | ICD-10-CM | POA: Diagnosis present

## 2021-11-24 DIAGNOSIS — E039 Hypothyroidism, unspecified: Secondary | ICD-10-CM | POA: Diagnosis present

## 2021-11-24 DIAGNOSIS — Z886 Allergy status to analgesic agent status: Secondary | ICD-10-CM | POA: Diagnosis not present

## 2021-11-24 DIAGNOSIS — E669 Obesity, unspecified: Secondary | ICD-10-CM | POA: Diagnosis present

## 2021-11-24 DIAGNOSIS — K529 Noninfective gastroenteritis and colitis, unspecified: Secondary | ICD-10-CM | POA: Diagnosis present

## 2021-11-24 DIAGNOSIS — Z832 Family history of diseases of the blood and blood-forming organs and certain disorders involving the immune mechanism: Secondary | ICD-10-CM

## 2021-11-24 DIAGNOSIS — E1122 Type 2 diabetes mellitus with diabetic chronic kidney disease: Secondary | ICD-10-CM | POA: Diagnosis present

## 2021-11-24 DIAGNOSIS — N189 Chronic kidney disease, unspecified: Secondary | ICD-10-CM | POA: Diagnosis not present

## 2021-11-24 DIAGNOSIS — E785 Hyperlipidemia, unspecified: Secondary | ICD-10-CM | POA: Diagnosis present

## 2021-11-24 DIAGNOSIS — I10 Essential (primary) hypertension: Secondary | ICD-10-CM | POA: Diagnosis not present

## 2021-11-24 DIAGNOSIS — I350 Nonrheumatic aortic (valve) stenosis: Secondary | ICD-10-CM | POA: Diagnosis present

## 2021-11-24 DIAGNOSIS — Z888 Allergy status to other drugs, medicaments and biological substances status: Secondary | ICD-10-CM

## 2021-11-24 DIAGNOSIS — I959 Hypotension, unspecified: Secondary | ICD-10-CM | POA: Diagnosis present

## 2021-11-24 DIAGNOSIS — N179 Acute kidney failure, unspecified: Secondary | ICD-10-CM | POA: Diagnosis present

## 2021-11-24 DIAGNOSIS — Z882 Allergy status to sulfonamides status: Secondary | ICD-10-CM | POA: Diagnosis not present

## 2021-11-24 DIAGNOSIS — Z823 Family history of stroke: Secondary | ICD-10-CM | POA: Diagnosis not present

## 2021-11-24 DIAGNOSIS — Z86010 Personal history of colonic polyps: Secondary | ICD-10-CM | POA: Diagnosis not present

## 2021-11-24 DIAGNOSIS — Z794 Long term (current) use of insulin: Secondary | ICD-10-CM | POA: Diagnosis not present

## 2021-11-24 DIAGNOSIS — L409 Psoriasis, unspecified: Secondary | ICD-10-CM | POA: Diagnosis present

## 2021-11-24 DIAGNOSIS — D72829 Elevated white blood cell count, unspecified: Secondary | ICD-10-CM | POA: Diagnosis present

## 2021-11-24 DIAGNOSIS — I129 Hypertensive chronic kidney disease with stage 1 through stage 4 chronic kidney disease, or unspecified chronic kidney disease: Secondary | ICD-10-CM | POA: Diagnosis present

## 2021-11-24 DIAGNOSIS — R600 Localized edema: Secondary | ICD-10-CM | POA: Diagnosis present

## 2021-11-24 DIAGNOSIS — Z79899 Other long term (current) drug therapy: Secondary | ICD-10-CM

## 2021-11-24 DIAGNOSIS — E860 Dehydration: Secondary | ICD-10-CM | POA: Diagnosis present

## 2021-11-24 DIAGNOSIS — N261 Atrophy of kidney (terminal): Secondary | ICD-10-CM | POA: Diagnosis not present

## 2021-11-24 DIAGNOSIS — K317 Polyp of stomach and duodenum: Secondary | ICD-10-CM | POA: Diagnosis present

## 2021-11-24 DIAGNOSIS — Q402 Other specified congenital malformations of stomach: Secondary | ICD-10-CM | POA: Diagnosis not present

## 2021-11-24 DIAGNOSIS — R197 Diarrhea, unspecified: Secondary | ICD-10-CM | POA: Diagnosis present

## 2021-11-24 DIAGNOSIS — K5730 Diverticulosis of large intestine without perforation or abscess without bleeding: Secondary | ICD-10-CM | POA: Diagnosis present

## 2021-11-24 DIAGNOSIS — K219 Gastro-esophageal reflux disease without esophagitis: Secondary | ICD-10-CM | POA: Diagnosis present

## 2021-11-24 DIAGNOSIS — K6289 Other specified diseases of anus and rectum: Secondary | ICD-10-CM | POA: Diagnosis not present

## 2021-11-24 DIAGNOSIS — K6389 Other specified diseases of intestine: Secondary | ICD-10-CM | POA: Diagnosis not present

## 2021-11-24 DIAGNOSIS — N289 Disorder of kidney and ureter, unspecified: Secondary | ICD-10-CM | POA: Diagnosis not present

## 2021-11-24 DIAGNOSIS — Z7982 Long term (current) use of aspirin: Secondary | ICD-10-CM

## 2021-11-24 DIAGNOSIS — Z79890 Hormone replacement therapy (postmenopausal): Secondary | ICD-10-CM

## 2021-11-24 LAB — URINALYSIS, ROUTINE W REFLEX MICROSCOPIC
Bilirubin Urine: NEGATIVE
Glucose, UA: NEGATIVE mg/dL
Hgb urine dipstick: NEGATIVE
Ketones, ur: NEGATIVE mg/dL
Leukocytes,Ua: NEGATIVE
Nitrite: NEGATIVE
Protein, ur: NEGATIVE mg/dL
Specific Gravity, Urine: 1.009 (ref 1.005–1.030)
pH: 5 (ref 5.0–8.0)

## 2021-11-24 LAB — CBC
HCT: 38.8 % (ref 36.0–46.0)
Hemoglobin: 13.4 g/dL (ref 12.0–15.0)
MCH: 32.4 pg (ref 26.0–34.0)
MCHC: 34.5 g/dL (ref 30.0–36.0)
MCV: 93.7 fL (ref 80.0–100.0)
Platelets: 271 10*3/uL (ref 150–400)
RBC: 4.14 MIL/uL (ref 3.87–5.11)
RDW: 12.9 % (ref 11.5–15.5)
WBC: 17.1 10*3/uL — ABNORMAL HIGH (ref 4.0–10.5)
nRBC: 0 % (ref 0.0–0.2)

## 2021-11-24 LAB — COMPREHENSIVE METABOLIC PANEL
ALT: 13 U/L (ref 0–44)
AST: 10 U/L — ABNORMAL LOW (ref 15–41)
Albumin: 3.9 g/dL (ref 3.5–5.0)
Alkaline Phosphatase: 54 U/L (ref 38–126)
Anion gap: 14 (ref 5–15)
BUN: 88 mg/dL — ABNORMAL HIGH (ref 8–23)
CO2: 19 mmol/L — ABNORMAL LOW (ref 22–32)
Calcium: 8.7 mg/dL — ABNORMAL LOW (ref 8.9–10.3)
Chloride: 101 mmol/L (ref 98–111)
Creatinine, Ser: 3.38 mg/dL — ABNORMAL HIGH (ref 0.44–1.00)
GFR, Estimated: 14 mL/min — ABNORMAL LOW (ref >60)
Glucose, Bld: 220 mg/dL — ABNORMAL HIGH (ref 70–99)
Potassium: 4.1 mmol/L (ref 3.5–5.1)
Sodium: 134 mmol/L — ABNORMAL LOW (ref 135–145)
Total Bilirubin: 0.3 mg/dL (ref 0.3–1.2)
Total Protein: 5.7 g/dL — ABNORMAL LOW (ref 6.5–8.1)

## 2021-11-24 LAB — IGA: Immunoglobulin A: 155 mg/dL (ref 70–320)

## 2021-11-24 LAB — TISSUE TRANSGLUTAMINASE, IGA: (tTG) Ab, IgA: <1 U/mL

## 2021-11-24 LAB — LIPASE, BLOOD: Lipase: 66 U/L — ABNORMAL HIGH (ref 11–51)

## 2021-11-24 MED ORDER — LACTATED RINGERS IV BOLUS
1000.0000 mL | Freq: Once | INTRAVENOUS | Status: AC
  Administered 2021-11-24: 1000 mL via INTRAVENOUS

## 2021-11-24 NOTE — Telephone Encounter (Signed)
Heather Dawson and Dr. Henrene Pastor, I attempted to call the patient at this time and she did not answer.  I left a detailed message on her voicemail to go to the emergency room for repeat labs and IV hydration.

## 2021-11-24 NOTE — Telephone Encounter (Signed)
Patient called states she has to heard anything from the kidney doctor, but she did get in touch with someone and they told her they would send an order. She stated all her results are off but she does need IV with fluids and they would call her back. So she wanted to let Carl Best NP aware. She also states she feels light headed, weak and dizzy. She did have a solid BM today since Tuesday morning. Seeking advise.

## 2021-11-24 NOTE — ED Triage Notes (Signed)
Patient here POV from Home.  Endorses Diarrhea for approximately 2 Weeks. Associated with ABD Pain. Seen by GI Specialist recently and told she was dehydrated and was sent for IV Hydration.   No N/V.   NAD Noted during Triage. A&Ox4. GCS 15. Ambulatory.

## 2021-11-24 NOTE — ED Provider Notes (Signed)
[facility]ERG[facility] DEPT Provider Note   CSN: [id number] Arrival date & time: 11/24/21  1925     History Chief Complaint  Patient presents with   Diarrhea    HPI Heather Dawson is a 66 y.o. female presenting for the lab.  She states that she was seen at gastroenterology clinic yesterday for checkup prior to her colonoscopy and she was found to have worsening of her kidney dysfunction on screening labs.  Notably, she has had severe diarrhea over the last 3 weeks with 3-4 liquid bowel movements per day.  She was also found to have concern for worsening of her lower extremity edema and was started on Lasix 40 mg daily leading into these labs yesterday. Labs yesterday were notable for a decrease of her GFR from her baseline of 26-14 and an elevation of her BUN.  Today she endorses muscle aches fatigue, poor p.o. intake and intermittent confusion.  She is otherwise ambulatory and tolerating p.o. intake..  Patient's recorded medical, surgical, social, medication list and allergies were reviewed in the Snapshot window as part of the initial history.   Review of Systems   Review of Systems  Constitutional:  Positive for fatigue. Negative for chills and fever.  HENT:  Negative for ear pain and sore throat.   Eyes:  Negative for pain and visual disturbance.  Respiratory:  Negative for cough and shortness of breath.   Cardiovascular:  Negative for chest pain and palpitations.  Gastrointestinal:  Positive for nausea. Negative for abdominal pain and vomiting.  Genitourinary:  Negative for dysuria and hematuria.  Musculoskeletal:  Positive for arthralgias. Negative for back pain.  Skin:  Negative for color change and rash.  Neurological:  Negative for seizures and syncope.  All other systems reviewed and are negative.   Physical Exam Updated Vital Signs BP (!) 98/57   Pulse 69   Temp 97.6 F (36.4 C) (Oral)   Resp 18   Ht 5' 1"  (1.549 m)   Wt 83.9 kg   SpO2 94%   BMI  34.95 kg/m  Physical Exam Vitals and nursing note reviewed.  Constitutional:      General: She is not in acute distress.    Appearance: She is well-developed.  HENT:     Head: Normocephalic and atraumatic.  Eyes:     Conjunctiva/sclera: Conjunctivae normal.  Cardiovascular:     Rate and Rhythm: Normal rate and regular rhythm.     Heart sounds: No murmur heard. Pulmonary:     Effort: Pulmonary effort is normal. No respiratory distress.     Breath sounds: Normal breath sounds.  Abdominal:     Palpations: Abdomen is soft.     Tenderness: There is no abdominal tenderness.  Musculoskeletal:        General: No swelling.     Cervical back: Neck supple.  Skin:    General: Skin is warm and dry.     Capillary Refill: Capillary refill takes less than 2 seconds.  Neurological:     Mental Status: She is alert.  Psychiatric:        Mood and Affect: Mood normal.      ED Course/ Medical Decision Making/ A&P    Procedures Procedures   Medications Ordered in ED Medications  lactated ringers bolus 1,000 mL (1,000 mLs Intravenous New Bag/Given 11/24/21 2310)    Medical Decision Making:    Heather Dawson is a 66 y.o. female who presented to the ED today with abnormal labs detailed above.  Patient's presentation is complicated by their history of advanced age, CKD, elevated BMI.  Patient placed on continuous vitals and telemetry monitoring while in ED which was reviewed periodically.   Complete initial physical exam performed, notably the patient  was hemodynamically stable in no acute distress.  She is GCS 15 at this time.      Reviewed and confirmed nursing documentation for past medical history, family history, social history.    Initial Assessment:   This is most consistent with an acute life/limb threatening illness complicated by underlying chronic conditions. Patient's history of present onset physical exam findings are most consistent with likely prerenal etiology given  multiple underlying provoking factors including diarrheal illness and recent diuretic use.   Initial Plan:  Screening labs including CBC and Metabolic panel to evaluate for infectious or metabolic etiology of disease.  Urinalysis with reflex culture ordered to evaluate for UTI or relevant urologic/nephrologic pathology.  Objective evaluation as below reviewed with plan for close reassessment  Initial Study Results:   Laboratory  Multifocal nephrologic abnormalities including elevated creatinine, elevated BUN, decreased GFR  Final Assessment and Plan:   Ultimately, patient with multifocal kidney dysfunction concerning for developing renal failure.  Patient is having sequelae of her hyperuricemia with intermittent confusion per the husband.  Given developing AKI, will plan to patient admitted to local facility, started on IV fluids with plan for close reassessment daily.   Clinical Impression:  1. AKI (acute kidney injury) ([place])      Admit   Final Clinical Impression(s) / ED Diagnoses Final diagnoses:  AKI (acute kidney injury) [facility])    Rx / [place] Orders ED Discharge Orders     None         Tretha Sciara, MD 11/24/21 2312

## 2021-11-24 NOTE — Telephone Encounter (Signed)
Pt states that she had office visit with Cottie Banda NP Tamala Julian yesterday and she had labs drawn:  Pt stated that today she is feeling lightheaded, dizzy, and week: Chart reviewed from Office visit yesterday and observed Carl Best NP documentation:  "I called Mrs. Vannostrand and discussed the above lab results.  I called her nephrologist Dr. Marval Regal and we discussed her creatinine level of 3.24 and GFR 14.48.  Dr. Marval Regal stated his office will contact the patient to schedule IV NS infusion tomorrow."  Pt stated that someone from Dr. Marval Regal office contacted the pt and notified that they will have to contact a company for her to receive IV fluids and that it will not be done today or Monday: Pt stated that she feels that she needs IV fluids; Pt was notified that she will more than likely have to go to the ED for IV hydration: Please advise if any other recommendations:

## 2021-11-25 ENCOUNTER — Inpatient Hospital Stay (HOSPITAL_COMMUNITY): Payer: PPO

## 2021-11-25 DIAGNOSIS — I959 Hypotension, unspecified: Secondary | ICD-10-CM

## 2021-11-25 DIAGNOSIS — K5730 Diverticulosis of large intestine without perforation or abscess without bleeding: Secondary | ICD-10-CM | POA: Diagnosis present

## 2021-11-25 DIAGNOSIS — Z886 Allergy status to analgesic agent status: Secondary | ICD-10-CM | POA: Diagnosis not present

## 2021-11-25 DIAGNOSIS — Z6833 Body mass index (BMI) 33.0-33.9, adult: Secondary | ICD-10-CM | POA: Diagnosis not present

## 2021-11-25 DIAGNOSIS — E039 Hypothyroidism, unspecified: Secondary | ICD-10-CM | POA: Diagnosis present

## 2021-11-25 DIAGNOSIS — E860 Dehydration: Secondary | ICD-10-CM | POA: Diagnosis present

## 2021-11-25 DIAGNOSIS — K219 Gastro-esophageal reflux disease without esophagitis: Secondary | ICD-10-CM

## 2021-11-25 DIAGNOSIS — Z888 Allergy status to other drugs, medicaments and biological substances status: Secondary | ICD-10-CM | POA: Diagnosis not present

## 2021-11-25 DIAGNOSIS — E669 Obesity, unspecified: Secondary | ICD-10-CM | POA: Diagnosis present

## 2021-11-25 DIAGNOSIS — R1013 Epigastric pain: Secondary | ICD-10-CM | POA: Diagnosis not present

## 2021-11-25 DIAGNOSIS — Z823 Family history of stroke: Secondary | ICD-10-CM | POA: Diagnosis not present

## 2021-11-25 DIAGNOSIS — D72829 Elevated white blood cell count, unspecified: Secondary | ICD-10-CM

## 2021-11-25 DIAGNOSIS — Q402 Other specified congenital malformations of stomach: Secondary | ICD-10-CM | POA: Diagnosis not present

## 2021-11-25 DIAGNOSIS — N179 Acute kidney failure, unspecified: Principal | ICD-10-CM

## 2021-11-25 DIAGNOSIS — K529 Noninfective gastroenteritis and colitis, unspecified: Secondary | ICD-10-CM | POA: Diagnosis present

## 2021-11-25 DIAGNOSIS — I129 Hypertensive chronic kidney disease with stage 1 through stage 4 chronic kidney disease, or unspecified chronic kidney disease: Secondary | ICD-10-CM | POA: Diagnosis present

## 2021-11-25 DIAGNOSIS — I10 Essential (primary) hypertension: Secondary | ICD-10-CM | POA: Diagnosis not present

## 2021-11-25 DIAGNOSIS — L409 Psoriasis, unspecified: Secondary | ICD-10-CM | POA: Diagnosis present

## 2021-11-25 DIAGNOSIS — R197 Diarrhea, unspecified: Secondary | ICD-10-CM

## 2021-11-25 DIAGNOSIS — E1169 Type 2 diabetes mellitus with other specified complication: Secondary | ICD-10-CM

## 2021-11-25 DIAGNOSIS — K6289 Other specified diseases of anus and rectum: Secondary | ICD-10-CM | POA: Diagnosis not present

## 2021-11-25 DIAGNOSIS — E1122 Type 2 diabetes mellitus with diabetic chronic kidney disease: Secondary | ICD-10-CM | POA: Diagnosis present

## 2021-11-25 DIAGNOSIS — N189 Chronic kidney disease, unspecified: Secondary | ICD-10-CM | POA: Diagnosis not present

## 2021-11-25 DIAGNOSIS — I350 Nonrheumatic aortic (valve) stenosis: Secondary | ICD-10-CM | POA: Diagnosis present

## 2021-11-25 DIAGNOSIS — K449 Diaphragmatic hernia without obstruction or gangrene: Secondary | ICD-10-CM | POA: Diagnosis not present

## 2021-11-25 DIAGNOSIS — Z8249 Family history of ischemic heart disease and other diseases of the circulatory system: Secondary | ICD-10-CM | POA: Diagnosis not present

## 2021-11-25 DIAGNOSIS — N184 Chronic kidney disease, stage 4 (severe): Secondary | ICD-10-CM | POA: Diagnosis present

## 2021-11-25 DIAGNOSIS — Z86010 Personal history of colonic polyps: Secondary | ICD-10-CM | POA: Diagnosis not present

## 2021-11-25 DIAGNOSIS — Z87440 Personal history of urinary (tract) infections: Secondary | ICD-10-CM | POA: Diagnosis not present

## 2021-11-25 DIAGNOSIS — Z87891 Personal history of nicotine dependence: Secondary | ICD-10-CM | POA: Diagnosis not present

## 2021-11-25 DIAGNOSIS — Z882 Allergy status to sulfonamides status: Secondary | ICD-10-CM | POA: Diagnosis not present

## 2021-11-25 DIAGNOSIS — E785 Hyperlipidemia, unspecified: Secondary | ICD-10-CM | POA: Diagnosis present

## 2021-11-25 DIAGNOSIS — Z794 Long term (current) use of insulin: Secondary | ICD-10-CM | POA: Diagnosis not present

## 2021-11-25 DIAGNOSIS — K317 Polyp of stomach and duodenum: Secondary | ICD-10-CM | POA: Diagnosis present

## 2021-11-25 LAB — BASIC METABOLIC PANEL
Anion gap: 9 (ref 5–15)
BUN: 78 mg/dL — ABNORMAL HIGH (ref 8–23)
CO2: 18 mmol/L — ABNORMAL LOW (ref 22–32)
Calcium: 8.3 mg/dL — ABNORMAL LOW (ref 8.9–10.3)
Chloride: 107 mmol/L (ref 98–111)
Creatinine, Ser: 2.62 mg/dL — ABNORMAL HIGH (ref 0.44–1.00)
GFR, Estimated: 20 mL/min — ABNORMAL LOW (ref >60)
Glucose, Bld: 246 mg/dL — ABNORMAL HIGH (ref 70–99)
Potassium: 4.3 mmol/L (ref 3.5–5.1)
Sodium: 134 mmol/L — ABNORMAL LOW (ref 135–145)

## 2021-11-25 LAB — GASTROINTESTINAL PANEL BY PCR, STOOL (REPLACES STOOL CULTURE)

## 2021-11-25 LAB — C DIFFICILE QUICK SCREEN W PCR REFLEX
C Diff antigen: NEGATIVE
C Diff interpretation: NOT DETECTED
C Diff toxin: NEGATIVE

## 2021-11-25 LAB — HIV ANTIBODY (ROUTINE TESTING W REFLEX): HIV Screen 4th Generation wRfx: NONREACTIVE

## 2021-11-25 LAB — SODIUM, URINE, RANDOM: Sodium, Ur: 46 mmol/L

## 2021-11-25 LAB — GLUCOSE, CAPILLARY
Glucose-Capillary: 201 mg/dL — ABNORMAL HIGH (ref 70–99)
Glucose-Capillary: 120 mg/dL — ABNORMAL HIGH (ref 70–99)
Glucose-Capillary: 168 mg/dL — ABNORMAL HIGH (ref 70–99)
Glucose-Capillary: 142 mg/dL — ABNORMAL HIGH (ref 70–99)

## 2021-11-25 LAB — CREATININE, URINE, RANDOM: Creatinine, Urine: 30 mg/dL

## 2021-11-25 MED ORDER — ACETAMINOPHEN 325 MG PO TABS: 650.0000 mg | ORAL_TABLET | Freq: Four times a day (QID) | ORAL | Status: DC | PRN

## 2021-11-25 MED ORDER — ONDANSETRON HCL 4 MG PO TABS: 4.0000 mg | ORAL_TABLET | Freq: Four times a day (QID) | ORAL | Status: DC | PRN

## 2021-11-25 MED ORDER — ACETAMINOPHEN 650 MG RE SUPP: 650.0000 mg | Freq: Four times a day (QID) | RECTAL | Status: DC | PRN

## 2021-11-25 MED ORDER — AMLODIPINE BESYLATE 10 MG PO TABS
10.0000 mg | ORAL_TABLET | Freq: Every day | ORAL | Status: DC
  Administered 2021-11-26 – 2021-11-27 (×2): 10 mg via ORAL
  Filled 2021-11-25 (×2): qty 1

## 2021-11-25 MED ORDER — IOHEXOL 9 MG/ML PO SOLN
ORAL | Status: AC
  Administered 2021-11-25: 500 mL
  Filled 2021-11-25: qty 1000

## 2021-11-25 MED ORDER — HYDRALAZINE HCL 50 MG PO TABS
100.0000 mg | ORAL_TABLET | Freq: Three times a day (TID) | ORAL | Status: DC
  Administered 2021-11-26 – 2021-11-27 (×4): 100 mg via ORAL
  Filled 2021-11-25 (×5): qty 2

## 2021-11-25 MED ORDER — LEVOTHYROXINE SODIUM 75 MCG PO TABS
75.0000 ug | ORAL_TABLET | Freq: Every day | ORAL | Status: DC
  Administered 2021-11-26 – 2021-11-27 (×2): 75 ug via ORAL
  Filled 2021-11-25 (×2): qty 1

## 2021-11-25 MED ORDER — CALCIUM GLUCONATE-NACL 2-0.675 GM/100ML-% IV SOLN
2.0000 g | Freq: Once | INTRAVENOUS | Status: AC
  Administered 2021-11-25: 2000 mg via INTRAVENOUS
  Filled 2021-11-25: qty 100

## 2021-11-25 MED ORDER — TRIAMCINOLONE ACETONIDE 0.1 % EX CREA: 1.0000 | TOPICAL_CREAM | Freq: Every day | CUTANEOUS | Status: DC | PRN

## 2021-11-25 MED ORDER — ONDANSETRON HCL 4 MG/2ML IJ SOLN: 4.0000 mg | Freq: Four times a day (QID) | INTRAMUSCULAR | Status: DC | PRN

## 2021-11-25 MED ORDER — SODIUM CHLORIDE 0.9 % IV SOLN: INTRAVENOUS | Status: AC

## 2021-11-25 MED ORDER — INSULIN ASPART 100 UNIT/ML IJ SOLN
0.0000 [IU] | Freq: Three times a day (TID) | INTRAMUSCULAR | Status: DC
  Administered 2021-11-25: 3 [IU] via SUBCUTANEOUS
  Administered 2021-11-26: 1 [IU] via SUBCUTANEOUS

## 2021-11-25 MED ORDER — PANTOPRAZOLE SODIUM 40 MG PO TBEC
40.0000 mg | DELAYED_RELEASE_TABLET | Freq: Two times a day (BID) | ORAL | Status: DC
  Administered 2021-11-25 – 2021-11-27 (×5): 40 mg via ORAL
  Filled 2021-11-25 (×5): qty 1

## 2021-11-25 MED ORDER — PEG 3350-KCL-NA BICARB-NACL 420 G PO SOLR
4000.0000 mL | Freq: Once | ORAL | Status: AC
  Administered 2021-11-26: 4000 mL via ORAL
  Filled 2021-11-25: qty 4000

## 2021-11-25 MED ORDER — HYDRALAZINE HCL 20 MG/ML IJ SOLN: 10.0000 mg | INTRAMUSCULAR | Status: DC | PRN

## 2021-11-25 MED ORDER — CALCIUM GLUCONATE-NACL 1-0.675 GM/50ML-% IV SOLN: 1.0000 g | Freq: Once | INTRAVENOUS | Status: DC

## 2021-11-25 MED ORDER — CLOBETASOL PROPIONATE 0.05 % EX CREA: 1.0000 | TOPICAL_CREAM | Freq: Every day | CUTANEOUS | Status: DC | PRN

## 2021-11-25 MED ORDER — SODIUM CHLORIDE 0.9% FLUSH
3.0000 mL | Freq: Two times a day (BID) | INTRAVENOUS | Status: DC
  Administered 2021-11-25 – 2021-11-27 (×3): 3 mL via INTRAVENOUS

## 2021-11-25 MED ORDER — ALBUTEROL SULFATE (2.5 MG/3ML) 0.083% IN NEBU: 2.5000 mg | INHALATION_SOLUTION | Freq: Four times a day (QID) | RESPIRATORY_TRACT | Status: DC | PRN

## 2021-11-25 MED ORDER — INSULIN ASPART 100 UNIT/ML IJ SOLN: 0.0000 [IU] | Freq: Every day | INTRAMUSCULAR | Status: DC

## 2021-11-25 MED ORDER — ASPIRIN 81 MG PO TBEC
81.0000 mg | DELAYED_RELEASE_TABLET | Freq: Every day | ORAL | Status: DC
  Administered 2021-11-25 – 2021-11-27 (×3): 81 mg via ORAL
  Filled 2021-11-25 (×3): qty 1

## 2021-11-25 MED ORDER — CETIRIZINE HCL 10 MG PO TABS: 5.0000 mg | ORAL_TABLET | Freq: Every day | ORAL | Status: DC | PRN

## 2021-11-25 MED ORDER — PRAVASTATIN SODIUM 40 MG PO TABS
80.0000 mg | ORAL_TABLET | Freq: Every day | ORAL | Status: DC
  Administered 2021-11-25 – 2021-11-26 (×2): 80 mg via ORAL
  Filled 2021-11-25 (×2): qty 2

## 2021-11-25 MED ORDER — HEPARIN SODIUM (PORCINE) 5000 UNIT/ML IJ SOLN
5000.0000 [IU] | Freq: Three times a day (TID) | INTRAMUSCULAR | Status: DC
  Administered 2021-11-25 – 2021-11-27 (×6): 5000 [IU] via SUBCUTANEOUS
  Filled 2021-11-25 (×6): qty 1

## 2021-11-25 NOTE — Consult Note (Signed)
CONSULT NOTE FOR [facility]  Reason for Consult: Diarrhea Referring Physician: [facility]  Heather Dawson HPI: This is a 66 year old female with a PMH of CKD stage IV, hyperlipidemia, HTN, TIA, GERD, and a history of C diff infection admitted for worsening diarrhea.  A few weeks ago she was reporting 5-6 episodes of watery diarrhea, on average.  The diarrhea was not associated with bleeding or abdominal pain, but she did some some minor blood on the toilet tissue.  This was presumed to be from a hemorrhoidal source.  She was able to control her diarrhea to some degree, but she was tired of her symptoms.  Arrangements with [facility] were made for her to undergo a repeat EGD/colonoscopy.  Her most recent colonoscopy, for routine purposes, was on 12/16/2018 with a finding of a 4 mm TA.  An EGD was performed on 04/13/2009 for GERD complaints and it was normal.  With her persistent diarrhea she presented in a dehydrated state.  Her creatinine increased up to 3.38 from a baseline of 1.89 - 2.42 previously this year.  With IV hydration her creatinine did drop down to 2.62.  Interestingly she reports that she did not have diarrhea since Tuesday this past week.  At that time she was using Imodium, but she denies taking any further Imodium.  With the IV hydration she is feeling much better.  Past Medical History:  Diagnosis Date   Abdominal pain, epigastric 12/20/2006   ALLERGIC RHINITIS 12/25/2006   Allergy    ANXIETY 10/16/2006   no per pt   Arthritis    right shoulder   BACK PAIN 06/15/2008   Blood transfusion without reported diagnosis    BRADYCARDIA 05/04/2008   C. difficile diarrhea    Carpal tunnel syndrome 10/16/2006   Cellulitis and abscess of other specified site 06/15/2008   CHEST PAIN 09/08/2009   Chronic low back pain    CIRRHOSIS 10/16/2006   CKD (chronic kidney disease)    2008- had Ecoli and caused renal failure, no problems since then   CKD (chronic kidney disease) stage 3, GFR 30-59  ml/min ([place]) 10/16/2006   Qualifier: Diagnosis of  By: Jenny Reichmann MD, [place] 91/69/4503   Complication of anesthesia    awake during 2 surgeries   CONTUSION, LOWER LEG 09/15/2008   DEPRESSION 10/16/2006   no per pt   Diabetes mellitus without complication ([place])    Esophageal reflux 12/25/2006   HYPERLIPIDEMIA 12/25/2006   Hyperlipidemia    HYPERTENSION 08/15/2006   Hypertension    HYPOTHYROIDISM 08/15/2006   Kidney failure, acute ([place]) 2009   as a result of severe E Coli infection   Left-sided carotid artery disease ([place])    MIGRAINE HEADACHE 10/16/2006   Obesity 10/16/2006   OVARIAN CYST 12/20/2006   Psoriasis 01/03/2011   SHINGLES, HX OF 12/20/2006   Stroke ([place])    3 strokes 2008-no deficits, only on ASA   TRANSIENT ISCHEMIC ATTACKS, HX OF 12/20/2006   VERTIGO 09/15/2007   VITAMIN B12 DEFICIENCY 01/09/2007    Past Surgical History:  Procedure Laterality Date   ABDOMINAL HYSTERECTOMY  02/2006   ANTERIOR CERVICAL DECOMP/DISCECTOMY FUSION  2005   CESAREAN SECTION     CHOLECYSTECTOMY     COLONOSCOPY     ENDARTERECTOMY Left 08/25/2014   Procedure: LEFT CAROTID ENDARTERECTOMY WITH HEMASHIELD PATCH ANGIOPLASTY;  Surgeon: Elam Dutch, MD;  Location: [facility] OR;  Service: Vascular;  Laterality: Left;   NECK SURGERY  2005   ROTATOR CUFF REPAIR Right 02/2014   Dr Veverly Fells    Family History  Problem Relation Age of Onset   Stroke Father    Heart disease Father    Diabetes Father    Lupus Mother    Raynaud syndrome Mother    Heart disease Mother    Diabetes Sister    Heart disease Brother    Hypertension Brother    Heart disease Brother    Hypertension Brother    Diabetes Other    Hypertension Other    Colon cancer Neg Hx    Esophageal cancer Neg Hx    Rectal cancer Neg Hx    Stomach cancer Neg Hx     Social History:  reports that she has quit smoking. She has never used smokeless tobacco. She reports that she does not drink alcohol and does not use  drugs.  Allergies:  Allergies  Allergen Reactions   Macrodantin [Nitrofurantoin Macrocrystal] Shortness Of Breath, Diarrhea and Other (See Comments)    GI upset   Adipex-P [Phentermine] Other (See Comments)    "All the side effects listed on the paper"   Asa [Aspirin] Other (See Comments)    Told to avoid due to kidney function   Bactrim [Sulfamethoxazole-Trimethoprim] Nausea And Vomiting   Cipro [Ciprofloxacin Hcl] Swelling   Levaquin [Levofloxacin In D5w] Other (See Comments)    GI upset   Lipitor [Atorvastatin] Other (See Comments)    Myalgia    Lyrica [Pregabalin] Swelling and Other (See Comments)    Headaches Confusion   Nsaids Other (See Comments)    Told to avoid due to kidney function   Vibramycin [Doxycycline] Nausea Only   Zinacef [Cefuroxime] Swelling   Neurontin [Gabapentin] Rash    Medications: Scheduled:  [START ON 11/26/2021] amLODipine  10 mg Oral Daily   aspirin EC  81 mg Oral Daily   heparin  5,000 Units Subcutaneous Q8H   [START ON 11/26/2021] hydrALAZINE  100 mg Oral TID   insulin aspart  0-5 Units Subcutaneous QHS   insulin aspart  0-9 Units Subcutaneous TID WC   levothyroxine  75 mcg Oral Daily   pantoprazole  40 mg Oral BID   pravastatin  80 mg Oral q1800   sodium chloride flush  3 mL Intravenous Q12H   Continuous:  sodium chloride 75 mL/hr at 11/25/21 0601   calcium gluconate      Results for orders placed or performed during the hospital encounter of 11/24/21 (from the past 24 hour(s))  CBC     Status: Abnormal   Collection Time: 11/24/21  7:45 PM  Result Value Ref Range   WBC 17.1 (H) 4.0 - 10.5 K/uL   RBC 4.14 3.87 - 5.11 MIL/uL   Hemoglobin 13.4 12.0 - 15.0 g/dL   HCT 38.8 36.0 - 46.0 %   MCV 93.7 80.0 - 100.0 fL   MCH 32.4 26.0 - 34.0 pg   MCHC 34.5 30.0 - 36.0 g/dL   RDW 12.9 11.5 - 15.5 %   Platelets 271 150 - 400 K/uL   nRBC 0.0 0.0 - 0.2 %  Urinalysis, Routine w reflex microscopic Urine, Clean Catch     Status: None    Collection Time: 11/24/21  7:45 PM  Result Value Ref Range   Color, Urine YELLOW YELLOW   APPearance CLEAR CLEAR   Specific Gravity, Urine 1.009 1.005 - 1.030   pH 5.0 5.0 - 8.0   Glucose, UA NEGATIVE NEGATIVE mg/dL   Hgb urine  dipstick NEGATIVE NEGATIVE   Bilirubin Urine NEGATIVE NEGATIVE   Ketones, ur NEGATIVE NEGATIVE mg/dL   Protein, ur NEGATIVE NEGATIVE mg/dL   Nitrite NEGATIVE NEGATIVE   Leukocytes,Ua NEGATIVE NEGATIVE  Comprehensive metabolic panel     Status: Abnormal   Collection Time: 11/24/21  9:53 PM  Result Value Ref Range   Sodium 134 (L) 135 - 145 mmol/L   Potassium 4.1 3.5 - 5.1 mmol/L   Chloride 101 98 - 111 mmol/L   CO2 19 (L) 22 - 32 mmol/L   Glucose, Bld 220 (H) 70 - 99 mg/dL   BUN 88 (H) 8 - 23 mg/dL   Creatinine, Ser 3.38 (H) 0.44 - 1.00 mg/dL   Calcium 8.7 (L) 8.9 - 10.3 mg/dL   Total Protein 5.7 (L) 6.5 - 8.1 g/dL   Albumin 3.9 3.5 - 5.0 g/dL   AST 10 (L) 15 - 41 U/L   ALT 13 0 - 44 U/L   Alkaline Phosphatase 54 38 - 126 U/L   Total Bilirubin 0.3 0.3 - 1.2 mg/dL   GFR, Estimated 14 (L) >60 mL/min   Anion gap 14 5 - 15  Lipase, blood     Status: Abnormal   Collection Time: 11/24/21  9:53 PM  Result Value Ref Range   Lipase 66 (H) 11 - 51 U/L  Creatinine, urine, random     Status: None   Collection Time: 11/25/21  8:15 AM  Result Value Ref Range   Creatinine, Urine 30 mg/dL  Sodium, urine, random     Status: None   Collection Time: 11/25/21  8:15 AM  Result Value Ref Range   Sodium, Ur 46 mmol/L  Glucose, capillary     Status: Abnormal   Collection Time: 11/25/21  8:55 AM  Result Value Ref Range   Glucose-Capillary 201 (H) 70 - 99 mg/dL  HIV Antibody (routine testing w rflx)     Status: None   Collection Time: 11/25/21  9:56 AM  Result Value Ref Range   HIV Screen 4th Generation wRfx Non Reactive Non Reactive  Basic metabolic panel     Status: Abnormal   Collection Time: 11/25/21  9:56 AM  Result Value Ref Range   Sodium 134 (L) 135 - 145  mmol/L   Potassium 4.3 3.5 - 5.1 mmol/L   Chloride 107 98 - 111 mmol/L   CO2 18 (L) 22 - 32 mmol/L   Glucose, Bld 246 (H) 70 - 99 mg/dL   BUN 78 (H) 8 - 23 mg/dL   Creatinine, Ser 2.62 (H) 0.44 - 1.00 mg/dL   Calcium 8.3 (L) 8.9 - 10.3 mg/dL   GFR, Estimated 20 (L) >60 mL/min   Anion gap 9 5 - 15  Glucose, capillary     Status: Abnormal   Collection Time: 11/25/21 11:51 AM  Result Value Ref Range   Glucose-Capillary 120 (H) 70 - 99 mg/dL     CT ABDOMEN PELVIS WO CONTRAST  Result Date: 11/25/2021 CLINICAL DATA:  Diarrhea.  Intestinal malabsorption. EXAM: CT ABDOMEN AND PELVIS WITHOUT CONTRAST TECHNIQUE: Multidetector CT imaging of the abdomen and pelvis was performed following the standard protocol without IV contrast. RADIATION DOSE REDUCTION: This exam was performed according to the departmental dose-optimization program which includes automated exposure control, adjustment of the mA and/or kV according to patient size and/or use of iterative reconstruction technique. COMPARISON:  CT head without scratched at CT abdomen pelvis without contrast 10/12/2020 FINDINGS: Lower chest: The lung bases are clear without focal nodule, mass,  or airspace disease. The heart size is normal. No significant pleural or pericardial effusion is present. Hepatobiliary: No focal liver abnormality is seen. Status post cholecystectomy. No biliary dilatation. Pancreas: Unremarkable. No pancreatic ductal dilatation or surrounding inflammatory changes. Spleen: Normal in size without focal abnormality. Adrenals/Urinary Tract: Adrenal glands are normal bilaterally. Asymmetric right-sided renal atrophy again noted. No stone or mass lesion is present. Ureters are within normal limits. No obstruction is present. The urinary bladder is normal. Stomach/Bowel: The stomach and duodenum are within normal limits. There is some wall thickening within the distal small bowel. No focal inflammation or stranding is present. The appendix is  visualized and normal. The ascending and transverse colon are within normal limits. Descending and sigmoid colon are normal. Vascular/Lymphatic: Atherosclerotic calcifications are present in the aorta and branch vessels. No aneurysm is present. No significant adenopathy is present. Reproductive: Status post hysterectomy. No adnexal masses. Other: Paraumbilical hernia contains fat but no bowel. Opening is 18 mm, stable no other significant ventral hernias are present. No free fluid or free air is present. Musculoskeletal: Vertebral body heights and alignment are normal. No focal osseous lesions are present. Lumbar disc disease is noted at L2-3 and L3-4. Chronic loss of disc height and endplate spurring is present at L5-S1. Bony pelvis is within normal limits. The hips are located and within normal limits. IMPRESSION: 1. Mild wall thickening within the distal small bowel without focal inflammation or stranding. This may represent a nonspecific enteritis. Involvement of the terminal ileum can be seen with inflammatory bowel disease. 2. No other acute or focal lesion to explain the patient's symptoms. 3. Stable periumbilical hernia containing fat but no bowel. 4. Stable asymmetric right-sided renal atrophy. 5. Cholecystectomy and hysterectomy. 6. Multilevel degenerative changes in the lumbar spine. 7.  Aortic Atherosclerosis (ICD10-I70.0). Electronically Signed   By: San Morelle M.D.   On: 11/25/2021 11:54    ROS:  As stated above in the HPI otherwise negative.  Blood pressure 113/63, pulse 72, temperature 97.9 F (36.6 C), temperature source Oral, resp. rate 19, height 5' 2"  (1.575 m), weight 83.9 kg, SpO2 97 %.    PE: Gen: NAD, Alert and Oriented HEENT:  [place]/AT, EOMI Neck: Supple, no LAD Lungs: CTA Bilaterally CV: RRR without M/G/R ABD: Soft, NTND, +BS Ext: No C/C/E  Assessment/Plan: 1) Diarrhea. 2) Stage IV CKD. 3) Weight loss - 8 lbs with the diarrhea.   Clinically she is feeling much  better with the IV hydration.  Her creatinine is approaching her baseline.  With her CKD, it will be prudent to wait until Monday to perform the EGD/colonoscopy as the prep will dehydrate her, to a certain degree, again.  Her prior work up for an infectious source was negative, but she is being rechecked again.  Plan: 1) Continue with IV hydration. 2) Follow creatinine. 3) Plan on EGD/colonoscopy on Monday with Dr. Havery Moros.  Heather Dawson D 11/25/2021, 2:27 PM

## 2021-11-25 NOTE — H&P (Signed)
History and Physical    Patient: Heather Dawson MRN:[id number] DOB: 06/13/1955 DOA: 11/24/2021 DOS: the patient was seen and examined on 11/25/2021 PCP: System, Provider Not In  Patient coming from: [facility] via [facility]  Chief Complaint:  Chief Complaint  Patient presents with   Diarrhea   HPI: [name] is a 66 y.o. female with medical history significant of hypertension, hyperlipidemia, TIA, CKD stage IV (baseline creatinine 2-2.2), hypothyroidism, C. difficile infection, segmental colitis, colonic polyps, and GERD who presents after having abnormal lab work which showed worsening kidney function in preparation for colonoscopy and EGD.  Patient reports that she was given antibiotics for UTI in April and subsequently started having diarrhea in June.  Reported having 3 or more liquid bowel movements per day on average.  She had been taking Kaopectate and it had helped initially some.  However, over the last 2 weeks reports increased frequency of diarrhea for which she had been feeling dehydrated.  She had been placed on chlorthalidone by her nephrologist, but this medication had recently been discontinued due to kidney function.  She was advised also to stop taking the lisinopril due to the diarrhea.  Associated symptoms included significant heartburn, epigastric discomfort,  lower extremity swelling, and mild confusion.  Eating seems to worsen epigastric discomfort.  She had also been advised to increase her occasions of Protonix without significant improvement.  Patient had just recently she stopped taking the Kaopectate and started Imodium 4 days ago.  Notes having 2 normal stools yesterday.  She has not seen any blood present in her stools.  She had followed up with [facility] and was scheduled for EGD/colonoscopy., and underwent outpatient screening labs as ordered by GI in preparation for the scopes, with these labs notable for interval increase in serum creatinine to 3.24.  Patient  has been urinating normally and denies having any dysuria symptoms.  She was encouraged to present to the emergency department for further evaluation and management of this interval change in her renal function.  On admission to the emergency department patient was noted to be afebrile with blood pressure 91/50 with improvement after IV fluid bolus with MAPs maintaining greater than 65.  Labs significant for WBC 17.1 with left shift noted prior, sodium 134, potassium 4.1, creatinine  3.38, BUN 88, glucose 220, calcium 8.7, and lipase 66.  Urinalysis did not show any signs of infection.  Patient has been given 1 L of normal saline IV fluids.   Review of Systems: As mentioned in the history of present illness. All other systems reviewed and are negative. Past Medical History:  Diagnosis Date   Abdominal pain, epigastric 12/20/2006   ALLERGIC RHINITIS 12/25/2006   Allergy    ANXIETY 10/16/2006   no per pt   Arthritis    right shoulder   BACK PAIN 06/15/2008   Blood transfusion without reported diagnosis    BRADYCARDIA 05/04/2008   C. difficile diarrhea    Carpal tunnel syndrome 10/16/2006   Cellulitis and abscess of other specified site 06/15/2008   CHEST PAIN 09/08/2009   Chronic low back pain    CIRRHOSIS 10/16/2006   CKD (chronic kidney disease)    2008- had Ecoli and caused renal failure, no problems since then   CKD (chronic kidney disease) stage 3, GFR 30-59 ml/min (HCC) 10/16/2006   Qualifier: Diagnosis of  By: John MD, James W    COMMON MIGRAINE 12/20/2006   Complication of anesthesia    awake during 2 surgeries     CONTUSION, LOWER LEG 09/15/2008   DEPRESSION 10/16/2006   no per pt   Diabetes mellitus without complication (HCC)    Esophageal reflux 12/25/2006   HYPERLIPIDEMIA 12/25/2006   Hyperlipidemia    HYPERTENSION 08/15/2006   Hypertension    HYPOTHYROIDISM 08/15/2006   Kidney failure, acute (HCC) 2009   as a result of severe E Coli infection   Left-sided carotid artery disease  (HCC)    MIGRAINE HEADACHE 10/16/2006   Obesity 10/16/2006   OVARIAN CYST 12/20/2006   Psoriasis 01/03/2011   SHINGLES, HX OF 12/20/2006   Stroke (HCC)    3 strokes 2008-no deficits, only on ASA   TRANSIENT ISCHEMIC ATTACKS, HX OF 12/20/2006   VERTIGO 09/15/2007   VITAMIN B12 DEFICIENCY 01/09/2007   Past Surgical History:  Procedure Laterality Date   ABDOMINAL HYSTERECTOMY  02/2006   ANTERIOR CERVICAL DECOMP/DISCECTOMY FUSION  2005   CESAREAN SECTION     CHOLECYSTECTOMY     COLONOSCOPY     ENDARTERECTOMY Left 08/25/2014   Procedure: LEFT CAROTID ENDARTERECTOMY WITH HEMASHIELD PATCH ANGIOPLASTY;  Surgeon: Charles E Fields, MD;  Location: [facility] OR;  Service: Vascular;  Laterality: Left;   NECK SURGERY  2005   ROTATOR CUFF REPAIR Right 02/2014   Dr Norris   Social History:  reports that she has quit smoking. She has never used smokeless tobacco. She reports that she does not drink alcohol and does not use drugs.  Allergies  Allergen Reactions   Bactrim [Sulfamethoxazole-Trimethoprim] Nausea And Vomiting   Cefuroxime Axetil Swelling     edema/swelling   Ciprofloxacin Swelling   Doxycycline Nausea Only   Levaquin [Levofloxacin In D5w] Other (See Comments)    GI upset   Lipitor [Atorvastatin] Other (See Comments)    Myalgia    Lyrica [Pregabalin] Other (See Comments)    HA, confusion, swelling   Macrodantin [Nitrofurantoin Macrocrystal] Other (See Comments)    GI upset, diarrhea and sob   Phentermine Other (See Comments)    All the side effects listed on the paper   Gabapentin Rash    Family History  Problem Relation Age of Onset   Stroke Father    Heart disease Father    Diabetes Father    Lupus Mother    Raynaud syndrome Mother    Heart disease Mother    Diabetes Sister    Heart disease Brother    Hypertension Brother    Heart disease Brother    Hypertension Brother    Diabetes Other    Hypertension Other    Colon cancer Neg Hx    Esophageal cancer Neg Hx    Rectal  cancer Neg Hx    Stomach cancer Neg Hx     Prior to Admission medications   Medication Sig Start Date End Date Taking? Authorizing Provider  acetaminophen (TYLENOL) 500 MG tablet Take 1,000 mg by mouth every 8 (eight) hours as needed (pain).    [provider]  amLODipine (NORVASC) 10 MG tablet 1 tab by mouth once daily 05/16/21   John, James W, MD  Ascorbic Acid (VITAMIN C) 1000 MG tablet Take 1,000 mg by mouth daily.    [provider]  aspirin EC 81 MG tablet Take 81 mg by mouth daily. Swallow whole.    [provider]  chlorthalidone (HYGROTON) 25 MG tablet Take 1 tablet (25 mg total) by mouth daily. 11/07/21   Berry, Jonathan J, MD  Cholecalciferol (VITAMIN D3) 1000 units CAPS Take 2 capsules (2,000 Units total) by mouth   daily. 04/27/15   John, James W, MD  clobetasol (TEMOVATE) 0.05 % external solution Apply 1 application topically daily as needed (scalp psoriasis).  12/07/15   [provider]  diclofenac sodium (VOLTAREN) 1 % GEL Apply 4 g topically 4 (four) times daily as needed. Patient not taking: Reported on 11/21/2021 10/31/18   John, James W, MD  estradiol (ESTRACE) 0.1 MG/GM vaginal cream Place vaginally. 03/15/21   [provider]  hydrALAZINE (APRESOLINE) 100 MG tablet Take 1 tablet (100 mg total) by mouth 3 (three) times daily. 05/16/21   John, James W, MD  levocetirizine (XYZAL) 5 MG tablet Take 5 mg by mouth every evening. 09/12/17   [provider]  levothyroxine (SYNTHROID) 75 MCG tablet Take 1 tablet (75 mcg total) by mouth daily. 05/16/21   John, James W, MD  lisinopril (ZESTRIL) 20 MG tablet Take 1 tablet (20 mg total) by mouth daily. 05/16/21   John, James W, MD  lovastatin (MEVACOR) 40 MG tablet Take 2 tablets (80 mg total) by mouth at bedtime. 05/16/21   John, James W, MD  pantoprazole (PROTONIX) 40 MG tablet Take 1 tablet (40 mg total) by mouth 2 (two) times daily before a meal. 10/12/21   John, James W, MD  predniSONE  (STERAPRED UNI-PAK 21 TAB) 10 MG (21) TBPK tablet Take 10 mg by mouth as directed. 11/09/21   [provider]  Sodium Sulfate-Mag Sulfate-KCl (SUTAB) 1479-225-188 MG TABS Take 1 kit by mouth as directed. 11/23/21   Kennedy-, Colleen M, NP  SUMAtriptan (IMITREX) 100 MG tablet TAKE ONE TABLET BY MOUTH AT ONSET OF MIGRAINE OR HEADACHE; MAY REPEAT ONE TABLET IN 2 HOURS IF NEEDED. Patient not taking: Reported on 11/21/2021 08/09/20   John, James W, MD  triamcinolone cream (KENALOG) 0.1 % Apply 1 application topically daily. 06/03/19   [provider]  TURMERIC CURCUMIN PO Take 1 tablet by mouth daily.    [provider]    Physical Exam: Vitals:   11/24/21 2200 11/24/21 2230 11/25/21 0411 11/25/21 0500  BP: (!) 91/50 (!) 98/57 131/67 129/70  Pulse: 73 69 82 84  Resp: 18 18 18 20  Temp:   98.6 F (37 C) 98.7 F (37.1 C)  TempSrc:   Oral Oral  SpO2: 94% 94% 97% 99%  Weight:    83.9 kg  Height:    5' 2" (1.575 m)   Constitutional: Older adult female currently in no acute distress Eyes: PERRL, lids and conjunctivae normal ENMT: Mucous membranes are moist.   Neck: normal, supple, no masses, no thyromegaly Respiratory: clear to auscultation bilaterally, no wheezing, no crackles. Normal respiratory effort. No accessory muscle use.  Cardiovascular: Regular rate and rhythm, mild 2/6 systolic murmur appreciated.  Trace lower extremity swelling. Abdomen: no tenderness, no masses palpated. Bowel sounds positive.  Musculoskeletal: no clubbing / cyanosis. No joint deformity upper and lower extremities. Good ROM, no contractures. Normal muscle tone.  Skin: no rashes, lesions, ulcers. No induration Neurologic: CN 2-12 grossly intact. Strength 5/5 in all 4.  Psychiatric: Normal judgment and insight. Alert and oriented x 3. Normal mood.   Data Reviewed:  EKG from 10/3 noted sinus bradycardia at 57 bpm  Assessment and Plan: Acute renal failure superimposed on chronic kidney  disease stage IV Patient noted to have worsening kidney function with creatinine elevated to 3.38 with BUN 88.  Baseline creatinine previously ranged 1.8-2.4.  This is greater than 0.3 increased to suggest acute kidney injury.  Patient has been given   1 L of normal saline IV fluids and then placed on 75 mL/h.  Urinalysis had shown no acute abnormality.  Patient followed by Dr. Coladonato nephrology in outpatient setting. -Admit to a medical telemetry bed -Strict I&O's  -Check urine studies -Hold nephrotoxic agents -Continue IV fluids at 75 mL/h -Continue to monitor kidney function daily  Leukocytosis Acute.  WBC elevated at 17.1 on arrival.  Urinalysis did not show signs of infection.  Patient did not meet any other sirs criteria.  Thought to be related to patient's GI symptoms. -Will monitor off of antibiotics at this time unless source appreciated or patient meets SIRS/sepsis criteria -Follow-up repeat CBC tomorrow morning  Transient hypotension essential hypertension Resolved.  Patient initially noted to have blood pressures as low as 91/50.  Blood pressures improved after IV fluids, but still appear to be soft.  Medications of chlorthalidone and lisinopril had recently been held. -Goal MAP greater than 65 -Continue IV fluids -Held lisinopril due to AKI -Orders placed to resume home blood pressure medications of hydralazine and amlodipine in a.m.  Epigastric abdominal pain Patient reports having epigastric abdominal pain with heartburn.  Lipase was mildly elevated at 66.  Differential included gastritis, PUD, or enteritis.  Lower suspicion for pancreatitis.  Plan admission for EGD and colonoscopy for further work-up. -Follow-up CT scan of the abdomen pelvis -GI consulted, will follow-up for any further recommendations  Diarrhea Acute on chronic.  Patient reports a couple month history of diarrhea that acutely worsened over the last 2 weeks.  She was scheduled to have colonoscopy with  [facility].  Prior stool studies were negative.  Unclear cause of symptoms at this time. -Enteric precautions -Check C. difficile and GI panel -Check CT scan -Any additional work-up per GI  Acute metabolic encephalopathy Resolved.  Thought secondary to patient being dehydrated as symptoms have improved with IV fluids.  Controlled diabetes mellitus type 2 On admission glucose elevated up to 220.  Last hemoglobin A1c 6.3 on 10/06/2021.  Patient managed without need of medications. -Hypoglycemic protocols -CBGs before every meal and at bedtime with sensitive SSI -Adjust regimen as needed  Hypothyroidism TSH 0.25 on 10/5. -Continue levothyroxine.  Consider rechecking labs in 4 to 6 weeks and adjust dose if needed.  GERD -Continue Protonix  Psoriasis -Continue ointments as needed  Obesity BMI 33.83 kg/m   Advance Care Planning:   Code Status: Full Code    Consults: Dr. Hung of GI covering for [place]  Family Communication: Husband updated at bedside  Severity of Illness: The appropriate patient status for this patient is INPATIENT. Inpatient status is judged to be reasonable and necessary in order to provide the required intensity of service to ensure the patient's safety. The patient's presenting symptoms, physical exam findings, and initial radiographic and laboratory data in the context of their chronic comorbidities is felt to place them at high risk for further clinical deterioration. Furthermore, it is not anticipated that the patient will be medically stable for discharge from the hospital within 2 midnights of admission.   * I certify that at the point of admission it is my clinical judgment that the patient will require inpatient hospital care spanning beyond 2 midnights from the point of admission due to high intensity of service, high risk for further deterioration and high frequency of surveillance required.*  Author:  A , MD 11/25/2021 7:57 AM  For on  call review [email or web address].  

## 2021-11-25 NOTE — ED Notes (Signed)
[facility] arrived to transport pt. Pt stable at time of departure ?

## 2021-11-25 NOTE — Progress Notes (Signed)
Transferring facility: [facility] Requesting provider: Dr. Oswald Hillock (EDP at [facility]) Reason for transfer: admission for further evaluation and management of aki on ckd 60.   66 year old female with medical history notable for CKD stage IV associated with baseline creatinine range 2.0-2.4, who presented to [facility] ED for further evaluation/management of outpatient laboratory abnormalities, specifically interval increase in serum creatinine relative to the above baseline.   The patient has been experiencing loose stool over the course of the last 2 weeks, with decreasing frequency of these episodes over the last few days.  Additionally, in the context of some lower extremity edema, she was reportedly started on Lasix by her nephrologist last week.  She is scheduled for EGD/colonoscopy as an outpatient GI in the near future, and underwent outpatient screening labs as ordered by GI in preparation for the scopes, with these labs notable for interval increase in serum creatinine to 3.38.  She was encouraged to present to the emergency department for further evaluation and management of this interval change in her renal function.  Patient's husband also has noted the patient to be slightly confused relative to baseline over the last few days.  Labs at [facility] today confirm the above elevation creatinine, and are also notable for acute prerenal azotemia serum potassium level 4.1.  Started on IVF's [facility] this evening.  Subsequently, I accepted this patient for transfer for inpatient admission to a med-tele bed at  [facility] for further work-up and management of acute kidney injury superimposed on stage IV CKD.       Check [email or web address] for on-call coverage.   Nursing staff, Please call [place] number on Amion as soon as patient's arrival, so appropriate admitting provider can evaluate the pt.     Babs Bertin, DO Hospitalist

## 2021-11-25 NOTE — Progress Notes (Signed)
Pt arrived to 5n25 by [facility]. Ambulated from hallway to bathroom-gait steady. Dr. [place] Crane notified of arrival to unit. Pt denies nausea/pain at this time.

## 2021-11-26 DIAGNOSIS — N189 Chronic kidney disease, unspecified: Secondary | ICD-10-CM | POA: Diagnosis not present

## 2021-11-26 DIAGNOSIS — N179 Acute kidney failure, unspecified: Secondary | ICD-10-CM | POA: Diagnosis not present

## 2021-11-26 LAB — BASIC METABOLIC PANEL
Anion gap: 5 (ref 5–15)
BUN: 57 mg/dL — ABNORMAL HIGH (ref 8–23)
CO2: 21 mmol/L — ABNORMAL LOW (ref 22–32)
Calcium: 8.5 mg/dL — ABNORMAL LOW (ref 8.9–10.3)
Chloride: 113 mmol/L — ABNORMAL HIGH (ref 98–111)
Creatinine, Ser: 2.39 mg/dL — ABNORMAL HIGH (ref 0.44–1.00)
GFR, Estimated: 22 mL/min — ABNORMAL LOW (ref >60)
Glucose, Bld: 180 mg/dL — ABNORMAL HIGH (ref 70–99)
Potassium: 4.8 mmol/L (ref 3.5–5.1)
Sodium: 139 mmol/L (ref 135–145)

## 2021-11-26 LAB — CBC
HCT: 32.3 % — ABNORMAL LOW (ref 36.0–46.0)
Hemoglobin: 10.8 g/dL — ABNORMAL LOW (ref 12.0–15.0)
MCH: 32 pg (ref 26.0–34.0)
MCHC: 33.4 g/dL (ref 30.0–36.0)
MCV: 95.6 fL (ref 80.0–100.0)
Platelets: 168 10*3/uL (ref 150–400)
RBC: 3.38 MIL/uL — ABNORMAL LOW (ref 3.87–5.11)
RDW: 12.8 % (ref 11.5–15.5)
WBC: 8.8 10*3/uL (ref 4.0–10.5)
nRBC: 0 % (ref 0.0–0.2)

## 2021-11-26 LAB — GLUCOSE, CAPILLARY
Glucose-Capillary: 145 mg/dL — ABNORMAL HIGH (ref 70–99)
Glucose-Capillary: 103 mg/dL — ABNORMAL HIGH (ref 70–99)
Glucose-Capillary: 116 mg/dL — ABNORMAL HIGH (ref 70–99)
Glucose-Capillary: 112 mg/dL — ABNORMAL HIGH (ref 70–99)

## 2021-11-26 MED ORDER — SODIUM CHLORIDE 0.9 % IV SOLN: INTRAVENOUS | Status: DC

## 2021-11-26 NOTE — Plan of Care (Signed)
  Problem: Activity: Goal: Risk for activity intolerance will decrease Outcome: Progressing   Problem: Nutrition: Goal: Adequate nutrition will be maintained Outcome: Progressing   Problem: Coping: Goal: Level of anxiety will decrease Outcome: Progressing   Problem: Elimination: Goal: Will not experience complications related to bowel motility Outcome: Progressing   Problem: Elimination: Goal: Will not experience complications related to urinary retention Outcome: Progressing

## 2021-11-26 NOTE — Progress Notes (Signed)
PROGRESS NOTE  Heather Dawson  AYO:[id number] DOB: December 07, 1955 DOA: 11/24/2021 PCP: System, Provider Not In   Brief Narrative:  Patient is a 66 year old female with history of hypertension, hyperlipidemia, TIA, CKD stage IV with baseline creatinine ranging from 2-2.2, hypothyroidism, C. difficile colitis, GERD who presented to the emergency department at [facility] for the evaluation of worsening kidney function found during lab work for preparation of colonoscopy/EGD.  She was having persistent diarrhea at home reporting 3 or more liquid bowel movement per day.  Also complained of heartburn, epigastric discomfort, lower extremity swelling, confusion.  On presentation she was mildly hypotensive but blood pressure improved with IV fluids.  Lab work showed leukocytosis of 17.1, creatinine of 3.38.  Patient was admitted for further work-up.  Assessment & Plan:  Principal Problem:   Acute kidney injury superimposed on chronic kidney disease ([place]) Active Problems:   Leukocytosis   Essential hypertension   Diarrhea   Abdominal pain, epigastric   Transient hypotension   Hypothyroidism   Psoriasis   GERD (gastroesophageal reflux disease)   Diabetes mellitus type 2 in obese (HCC)   Obesity (BMI 30-39.9)   AKI on CKD stage IV: Found to have elevated creatinine from baseline.  On presentation creatinine was 3.3.  Baseline creatinine ranges from 2-2.2.  Kidney function has returned close to baseline with IV fluids.  Follows with [facility].  Continue gentle IV fluids.  Leukocytosis: Resolved.  Diarrhea: C. difficile, GI pathogen panel negative.  Diarrhea has resolved  History of hypertension: Initially hypotensive on presentation, now blood pressure stable.  Was taking chlorthalidone, lisinopril at home, now on hold as per her nephrologist.  Taking hydralazine and amlodipine which will resume when appropriate  Epigastric abdominal pain: GI already following.  Lipase is mildly elevated at 66.   Could be gastritis, enteritis or peptic ulcer disease , low suspicion for pancreatitis.  Plan for EGD .  CT abdomen/pelvis showed mild wall thickening within the distal small bowel without focal inflammation or stranding suspicious for  enteritis. Her abdominal pain has resolved now.  Confusion: Could be secondary to AKI.  Completely resolved, remains alert and oriented  Diabetes type 2: A1c of 6.3 as of 8/18.  Continue current insulin regimen.  Monitor blood sugars  Hypothyroidism: Continue Synthyroid  GERD: Continue Protonix  Obesity: BMI of 33.8         DVT prophylaxis:heparin injection 5,000 Units Start: 11/25/21 0915     Code Status: Full Code  Family Communication: None at the bedside  Patient status:Inpatient  Patient is from :Home  Anticipated discharge EL:TRVU  Estimated DC date: Tomorrow   Consultants: GI  Procedures:None yet  Antimicrobials:  Anti-infectives (From admission, onward)    None       Subjective: Patient seen and examined at the bedside.  She looks very comfortable, sitting in the chair.  Alert and oriented.  Denies any abdomen pain, nausea or vomiting.  Diarrhea has resolved  Objective: Vitals:   11/25/21 1532 11/25/21 2117 11/26/21 0430 11/26/21 0748  BP: (!) 116/91 133/65 126/62 (!) 150/71  Pulse: 72 68 65 69  Resp:  18 16 17   Temp: 98 F (36.7 C) 98.5 F (36.9 C) 98.5 F (36.9 C) 98.6 F (37 C)  TempSrc: Oral Oral Oral Oral  SpO2: 99% 97% 97% 99%  Weight:      Height:        Intake/Output Summary (Last 24 hours) at 11/26/2021 0806 Last data filed at 11/25/2021 2245 Gross per 24 hour  Intake 1564.29 ml  Output --  Net 1564.29 ml   Filed Weights   11/24/21 1939 11/25/21 0500  Weight: 83.9 kg 83.9 kg    Examination:  General exam: Overall comfortable, not in distress,obese HEENT: PERRL Respiratory system:  no wheezes or crackles  Cardiovascular system: S1 & S2 heard, RRR.  Gastrointestinal system: Abdomen is  nondistended, soft and nontender. Central nervous system: Alert and oriented Extremities: No edema, no clubbing ,no cyanosis Skin: No rashes, no ulcers,no icterus     Data Reviewed: I have personally reviewed following labs and imaging studies  CBC: Recent Labs  Lab 11/23/21 1438 11/24/21 1945 11/26/21 0317  WBC 14.0* 17.1* 8.8  NEUTROABS 12.0*  --   --   HGB 13.0 13.4 10.8*  HCT 39.3 38.8 32.3*  MCV 94.7 93.7 95.6  PLT 272.0 271 465   Basic Metabolic Panel: Recent Labs  Lab 11/23/21 1438 11/24/21 2153 11/25/21 0956 11/26/21 0317  NA 133* 134* 134* 139  K 4.3 4.1 4.3 4.8  CL 99 101 107 113*  CO2 22 19* 18* 21*  GLUCOSE 188* 220* 246* 180*  BUN 79* 88* 78* 57*  CREATININE 3.24* 3.38* 2.62* 2.39*  CALCIUM 8.8 8.7* 8.3* 8.5*     Recent Results (from the past 240 hour(s))  Gastrointestinal Panel by PCR , Stool     Status: None   Collection Time: 11/25/21  8:17 AM   Specimen: Stool  Result Value Ref Range Status   Campylobacter species NOT DETECTED NOT DETECTED Final   Plesimonas shigelloides NOT DETECTED NOT DETECTED Final   Salmonella species NOT DETECTED NOT DETECTED Final   Yersinia enterocolitica NOT DETECTED NOT DETECTED Final   Vibrio species NOT DETECTED NOT DETECTED Final   Vibrio cholerae NOT DETECTED NOT DETECTED Final   Enteroaggregative E coli (EAEC) NOT DETECTED NOT DETECTED Final   Enteropathogenic E coli (EPEC) NOT DETECTED NOT DETECTED Final   Enterotoxigenic E coli (ETEC) NOT DETECTED NOT DETECTED Final   Shiga like toxin producing E coli (STEC) NOT DETECTED NOT DETECTED Final   Shigella/Enteroinvasive E coli (EIEC) NOT DETECTED NOT DETECTED Final   Cryptosporidium NOT DETECTED NOT DETECTED Final   Cyclospora cayetanensis NOT DETECTED NOT DETECTED Final   Entamoeba histolytica NOT DETECTED NOT DETECTED Final   Giardia lamblia NOT DETECTED NOT DETECTED Final   Adenovirus F40/41 NOT DETECTED NOT DETECTED Final   Astrovirus NOT DETECTED NOT  DETECTED Final   Norovirus GI/GII NOT DETECTED NOT DETECTED Final   Rotavirus A NOT DETECTED NOT DETECTED Final   Sapovirus (I, II, IV, and V) NOT DETECTED NOT DETECTED Final    Comment: Performed at [facility], [address] [place], [place] [address]  C Difficile Quick Screen w PCR reflex     Status: None   Collection Time: 11/25/21  1:15 PM   Specimen: Stool  Result Value Ref Range Status   C Diff antigen NEGATIVE NEGATIVE Final   C Diff toxin NEGATIVE NEGATIVE Final   C Diff interpretation No C. difficile detected.  Final    Comment: Performed at [facility], [place] [address] [place], [place] [address]     Radiology Studies: CT ABDOMEN PELVIS WO CONTRAST  Result Date: 11/25/2021 CLINICAL DATA:  Diarrhea.  Intestinal malabsorption. EXAM: CT ABDOMEN AND PELVIS WITHOUT CONTRAST TECHNIQUE: Multidetector CT imaging of the abdomen and pelvis was performed following the standard protocol without IV contrast. RADIATION DOSE REDUCTION: This exam was performed according to the departmental dose-optimization program  which includes automated exposure control, adjustment of the mA and/or kV according to patient size and/or use of iterative reconstruction technique. COMPARISON:  CT head without scratched at CT abdomen pelvis without contrast 10/12/2020 FINDINGS: Lower chest: The lung bases are clear without focal nodule, mass, or airspace disease. The heart size is normal. No significant pleural or pericardial effusion is present. Hepatobiliary: No focal liver abnormality is seen. Status post cholecystectomy. No biliary dilatation. Pancreas: Unremarkable. No pancreatic ductal dilatation or surrounding inflammatory changes. Spleen: Normal in size without focal abnormality. Adrenals/Urinary Tract: Adrenal glands are normal bilaterally. Asymmetric right-sided renal atrophy again noted. No stone or mass lesion is present. Ureters are within normal limits. No obstruction is present. The  urinary bladder is normal. Stomach/Bowel: The stomach and duodenum are within normal limits. There is some wall thickening within the distal small bowel. No focal inflammation or stranding is present. The appendix is visualized and normal. The ascending and transverse colon are within normal limits. Descending and sigmoid colon are normal. Vascular/Lymphatic: Atherosclerotic calcifications are present in the aorta and branch vessels. No aneurysm is present. No significant adenopathy is present. Reproductive: Status post hysterectomy. No adnexal masses. Other: Paraumbilical hernia contains fat but no bowel. Opening is 18 mm, stable no other significant ventral hernias are present. No free fluid or free air is present. Musculoskeletal: Vertebral body heights and alignment are normal. No focal osseous lesions are present. Lumbar disc disease is noted at L2-3 and L3-4. Chronic loss of disc height and endplate spurring is present at L5-S1. Bony pelvis is within normal limits. The hips are located and within normal limits. IMPRESSION: 1. Mild wall thickening within the distal small bowel without focal inflammation or stranding. This may represent a nonspecific enteritis. Involvement of the terminal ileum can be seen with inflammatory bowel disease. 2. No other acute or focal lesion to explain the patient's symptoms. 3. Stable periumbilical hernia containing fat but no bowel. 4. Stable asymmetric right-sided renal atrophy. 5. Cholecystectomy and hysterectomy. 6. Multilevel degenerative changes in the lumbar spine. 7.  Aortic Atherosclerosis (ICD10-I70.0). Electronically Signed   By: San Morelle M.D.   On: 11/25/2021 11:54    Scheduled Meds:  amLODipine  10 mg Oral Daily   aspirin EC  81 mg Oral Daily   heparin  5,000 Units Subcutaneous Q8H   hydrALAZINE  100 mg Oral TID   insulin aspart  0-5 Units Subcutaneous QHS   insulin aspart  0-9 Units Subcutaneous TID WC   levothyroxine  75 mcg Oral Daily    pantoprazole  40 mg Oral BID   polyethylene glycol-electrolytes  4,000 mL Oral Once   pravastatin  80 mg Oral q1800   sodium chloride flush  3 mL Intravenous Q12H   Continuous Infusions:   LOS: 1 day   Shelly Coss, MD [facility] P10/09/2021, 8:06 AM

## 2021-11-26 NOTE — Anesthesia Preprocedure Evaluation (Signed)
Anesthesia Evaluation  Patient identified by MRN, date of birth, ID band Patient awake    Reviewed: Allergy & Precautions, NPO status , Patient's Chart, lab work & pertinent test results  History of Anesthesia Complications Negative for: history of anesthetic complications  Airway Mallampati: II  TM Distance: >3 FB Neck ROM: Full    Dental  (+) Missing, Dental Advisory Given, Caps   Pulmonary former smoker,    breath sounds clear to auscultation       Cardiovascular hypertension, Pt. on medications (-) angina Rhythm:Regular Rate:Normal  07/2020 ECHO: EF 60-65%, mild LVH, grade 1 DD, normal RVF, mild AS with mean gradient 19 mmHg   Neuro/Psych  Headaches, Anxiety Depression CVA, No Residual Symptoms    GI/Hepatic GERD  Medicated and Controlled,(+) Cirrhosis       ,   Endo/Other  diabetes (glu 107)Hypothyroidism BMI 33  Renal/GU Renal InsufficiencyRenal disease     Musculoskeletal  (+) Arthritis ,   Abdominal (+) + obese,   Peds  Hematology  (+) Blood dyscrasia (Hb 10.8), anemia ,   Anesthesia Other Findings   Reproductive/Obstetrics                            Anesthesia Physical Anesthesia Plan  ASA: 3  Anesthesia Plan: MAC   Post-op Pain Management: Minimal or no pain anticipated   Induction:   PONV Risk Score and Plan: 2 and Ondansetron and Treatment may vary due to age or medical condition  Airway Management Planned: Natural Airway and Simple Face Mask  Additional Equipment: None  Intra-op Plan:   Post-operative Plan:   Informed Consent: I have reviewed the patients History and Physical, chart, labs and discussed the procedure including the risks, benefits and alternatives for the proposed anesthesia with the patient or authorized representative who has indicated his/her understanding and acceptance.     Dental advisory given  Plan Discussed with: CRNA and  Surgeon  Anesthesia Plan Comments:        Anesthesia Quick Evaluation

## 2021-11-26 NOTE — H&P (View-Only) (Signed)
PROGRESS NOTE FOR [facility]  Subjective: Feeling well.  No reports of any diarrhea.  Objective: Vital signs in last 24 hours: Temp:  [97.9 F (36.6 C)-98.6 F (37 C)] 98.6 F (37 C) (10/08 0748) Pulse Rate:  [65-72] 69 (10/08 0748) Resp:  [16-19] 17 (10/08 0748) BP: (113-150)/(62-91) 150/71 (10/08 0748) SpO2:  [97 %-99 %] 99 % (10/08 0748) Last BM Date : 11/24/21  Intake/Output from previous day: 10/07 0701 - 10/08 0700 In: 1564.3 [P.O.:900; I.V.:664.3] Out: -  Intake/Output this shift: No intake/output data recorded.  General appearance: alert and no distress GI: soft, non-tender; bowel sounds normal; no masses,  no organomegaly  Lab Results: Recent Labs    11/23/21 1438 11/24/21 1945 11/26/21 0317  WBC 14.0* 17.1* 8.8  HGB 13.0 13.4 10.8*  HCT 39.3 38.8 32.3*  PLT 272.0 271 168   BMET Recent Labs    11/24/21 2153 11/25/21 0956 11/26/21 0317  NA 134* 134* 139  K 4.1 4.3 4.8  CL 101 107 113*  CO2 19* 18* 21*  GLUCOSE 220* 246* 180*  BUN 88* 78* 57*  CREATININE 3.38* 2.62* 2.39*  CALCIUM 8.7* 8.3* 8.5*   LFT Recent Labs    11/24/21 2153  PROT 5.7*  ALBUMIN 3.9  AST 10*  ALT 13  ALKPHOS 54  BILITOT 0.3   PT/INR No results for input(s): "LABPROT", "INR" in the last 72 hours. Hepatitis Panel No results for input(s): "HEPBSAG", "HCVAB", "HEPAIGM", "HEPBIGM" in the last 72 hours. C-Diff Recent Labs    11/25/21 1315  CDIFFTOX NEGATIVE   Fecal Lactopherrin No results for input(s): "FECLLACTOFRN" in the last 72 hours.  Studies/Results: CT ABDOMEN PELVIS WO CONTRAST  Result Date: 11/25/2021 CLINICAL DATA:  Diarrhea.  Intestinal malabsorption. EXAM: CT ABDOMEN AND PELVIS WITHOUT CONTRAST TECHNIQUE: Multidetector CT imaging of the abdomen and pelvis was performed following the standard protocol without IV contrast. RADIATION DOSE REDUCTION: This exam was performed according to the departmental dose-optimization program which includes automated  exposure control, adjustment of the mA and/or kV according to patient size and/or use of iterative reconstruction technique. COMPARISON:  CT head without scratched at CT abdomen pelvis without contrast 10/12/2020 FINDINGS: Lower chest: The lung bases are clear without focal nodule, mass, or airspace disease. The heart size is normal. No significant pleural or pericardial effusion is present. Hepatobiliary: No focal liver abnormality is seen. Status post cholecystectomy. No biliary dilatation. Pancreas: Unremarkable. No pancreatic ductal dilatation or surrounding inflammatory changes. Spleen: Normal in size without focal abnormality. Adrenals/Urinary Tract: Adrenal glands are normal bilaterally. Asymmetric right-sided renal atrophy again noted. No stone or mass lesion is present. Ureters are within normal limits. No obstruction is present. The urinary bladder is normal. Stomach/Bowel: The stomach and duodenum are within normal limits. There is some wall thickening within the distal small bowel. No focal inflammation or stranding is present. The appendix is visualized and normal. The ascending and transverse colon are within normal limits. Descending and sigmoid colon are normal. Vascular/Lymphatic: Atherosclerotic calcifications are present in the aorta and branch vessels. No aneurysm is present. No significant adenopathy is present. Reproductive: Status post hysterectomy. No adnexal masses. Other: Paraumbilical hernia contains fat but no bowel. Opening is 18 mm, stable no other significant ventral hernias are present. No free fluid or free air is present. Musculoskeletal: Vertebral body heights and alignment are normal. No focal osseous lesions are present. Lumbar disc disease is noted at L2-3 and L3-4. Chronic loss of disc height and endplate spurring is present  at L5-S1. Bony pelvis is within normal limits. The hips are located and within normal limits. IMPRESSION: 1. Mild wall thickening within the distal small  bowel without focal inflammation or stranding. This may represent a nonspecific enteritis. Involvement of the terminal ileum can be seen with inflammatory bowel disease. 2. No other acute or focal lesion to explain the patient's symptoms. 3. Stable periumbilical hernia containing fat but no bowel. 4. Stable asymmetric right-sided renal atrophy. 5. Cholecystectomy and hysterectomy. 6. Multilevel degenerative changes in the lumbar spine. 7.  Aortic Atherosclerosis (ICD10-I70.0). Electronically Signed   By: San Morelle M.D.   On: 11/25/2021 11:54    Medications: Scheduled:  amLODipine  10 mg Oral Daily   aspirin EC  81 mg Oral Daily   heparin  5,000 Units Subcutaneous Q8H   hydrALAZINE  100 mg Oral TID   insulin aspart  0-5 Units Subcutaneous QHS   insulin aspart  0-9 Units Subcutaneous TID WC   levothyroxine  75 mcg Oral Daily   pantoprazole  40 mg Oral BID   polyethylene glycol-electrolytes  4,000 mL Oral Once   pravastatin  80 mg Oral q1800   sodium chloride flush  3 mL Intravenous Q12H   Continuous:  Assessment/Plan: 1) Chronic diarrhea. 2) CKD.   She continues to improve clinically.  Her creatinine also declined and she does not report any diarrhea, however, she has a chronic history of diarrhea of late.    Plan: 1) EGD/colonoscopy with [facility] tomorrow.  LOS: 1 day   Ulys Favia D 11/26/2021, 7:54 AM

## 2021-11-26 NOTE — Progress Notes (Signed)
PROGRESS NOTE FOR [name]  Subjective: Feeling well.  No reports of any diarrhea.  Objective: Vital signs in last 24 hours: Temp:  [97.9 F (36.6 C)-98.6 F (37 C)] 98.6 F (37 C) (10/08 0748) Pulse Rate:  [65-72] 69 (10/08 0748) Resp:  [16-19] 17 (10/08 0748) BP: (113-150)/(62-91) 150/71 (10/08 0748) SpO2:  [97 %-99 %] 99 % (10/08 0748) Last BM Date : 11/24/21  Intake/Output from previous day: 10/07 0701 - 10/08 0700 In: 1564.3 [P.O.:900; I.V.:664.3] Out: -  Intake/Output this shift: No intake/output data recorded.  General appearance: alert and no distress GI: soft, non-tender; bowel sounds normal; no masses,  no organomegaly  Lab Results: Recent Labs    11/23/21 1438 11/24/21 1945 11/26/21 0317  WBC 14.0* 17.1* 8.8  HGB 13.0 13.4 10.8*  HCT 39.3 38.8 32.3*  PLT 272.0 271 168   BMET Recent Labs    11/24/21 2153 11/25/21 0956 11/26/21 0317  NA 134* 134* 139  K 4.1 4.3 4.8  CL 101 107 113*  CO2 19* 18* 21*  GLUCOSE 220* 246* 180*  BUN 88* 78* 57*  CREATININE 3.38* 2.62* 2.39*  CALCIUM 8.7* 8.3* 8.5*   LFT Recent Labs    11/24/21 2153  PROT 5.7*  ALBUMIN 3.9  AST 10*  ALT 13  ALKPHOS 54  BILITOT 0.3   PT/INR No results for input(s): "LABPROT", "INR" in the last 72 hours. Hepatitis Panel No results for input(s): "HEPBSAG", "HCVAB", "HEPAIGM", "HEPBIGM" in the last 72 hours. C-Diff Recent Labs    11/25/21 1315  CDIFFTOX NEGATIVE   Fecal Lactopherrin No results for input(s): "FECLLACTOFRN" in the last 72 hours.  Studies/Results: CT ABDOMEN PELVIS WO CONTRAST  Result Date: 11/25/2021 CLINICAL DATA:  Diarrhea.  Intestinal malabsorption. EXAM: CT ABDOMEN AND PELVIS WITHOUT CONTRAST TECHNIQUE: Multidetector CT imaging of the abdomen and pelvis was performed following the standard protocol without IV contrast. RADIATION DOSE REDUCTION: This exam was performed according to the departmental dose-optimization program which includes automated  exposure control, adjustment of the mA and/or kV according to patient size and/or use of iterative reconstruction technique. COMPARISON:  CT head without scratched at CT abdomen pelvis without contrast 10/12/2020 FINDINGS: Lower chest: The lung bases are clear without focal nodule, mass, or airspace disease. The heart size is normal. No significant pleural or pericardial effusion is present. Hepatobiliary: No focal liver abnormality is seen. Status post cholecystectomy. No biliary dilatation. Pancreas: Unremarkable. No pancreatic ductal dilatation or surrounding inflammatory changes. Spleen: Normal in size without focal abnormality. Adrenals/Urinary Tract: Adrenal glands are normal bilaterally. Asymmetric right-sided renal atrophy again noted. No stone or mass lesion is present. Ureters are within normal limits. No obstruction is present. The urinary bladder is normal. Stomach/Bowel: The stomach and duodenum are within normal limits. There is some wall thickening within the distal small bowel. No focal inflammation or stranding is present. The appendix is visualized and normal. The ascending and transverse colon are within normal limits. Descending and sigmoid colon are normal. Vascular/Lymphatic: Atherosclerotic calcifications are present in the aorta and branch vessels. No aneurysm is present. No significant adenopathy is present. Reproductive: Status post hysterectomy. No adnexal masses. Other: Paraumbilical hernia contains fat but no bowel. Opening is 18 mm, stable no other significant ventral hernias are present. No free fluid or free air is present. Musculoskeletal: Vertebral body heights and alignment are normal. No focal osseous lesions are present. Lumbar disc disease is noted at L2-3 and L3-4. Chronic loss of disc height and endplate spurring is present  at L5-S1. Bony pelvis is within normal limits. The hips are located and within normal limits. IMPRESSION: 1. Mild wall thickening within the distal small  bowel without focal inflammation or stranding. This may represent a nonspecific enteritis. Involvement of the terminal ileum can be seen with inflammatory bowel disease. 2. No other acute or focal lesion to explain the patient's symptoms. 3. Stable periumbilical hernia containing fat but no bowel. 4. Stable asymmetric right-sided renal atrophy. 5. Cholecystectomy and hysterectomy. 6. Multilevel degenerative changes in the lumbar spine. 7.  Aortic Atherosclerosis (ICD10-I70.0). Electronically Signed   By: San Morelle M.D.   On: 11/25/2021 11:54    Medications: Scheduled:  amLODipine  10 mg Oral Daily   aspirin EC  81 mg Oral Daily   heparin  5,000 Units Subcutaneous Q8H   hydrALAZINE  100 mg Oral TID   insulin aspart  0-5 Units Subcutaneous QHS   insulin aspart  0-9 Units Subcutaneous TID WC   levothyroxine  75 mcg Oral Daily   pantoprazole  40 mg Oral BID   polyethylene glycol-electrolytes  4,000 mL Oral Once   pravastatin  80 mg Oral q1800   sodium chloride flush  3 mL Intravenous Q12H   Continuous:  Assessment/Plan: 1) Chronic diarrhea. 2) CKD.   She continues to improve clinically.  Her creatinine also declined and she does not report any diarrhea, however, she has a chronic history of diarrhea of late.    Plan: 1) EGD/colonoscopy with [facility] tomorrow.  LOS: 1 day   Heather Dawson D 11/26/2021, 7:54 AM

## 2021-11-27 ENCOUNTER — Ambulatory Visit (HOSPITAL_COMMUNITY): Payer: PPO | Attending: Cardiology

## 2021-11-27 ENCOUNTER — Inpatient Hospital Stay (HOSPITAL_COMMUNITY): Payer: PPO | Admitting: Anesthesiology

## 2021-11-27 ENCOUNTER — Encounter (HOSPITAL_COMMUNITY)
Admission: EM | Disposition: A | Discharge status: Home / Self Care | Payer: Self-pay | Source: Home / Self Care | Attending: Internal Medicine

## 2021-11-27 ENCOUNTER — Encounter (HOSPITAL_COMMUNITY): Payer: Self-pay | Admitting: Internal Medicine

## 2021-11-27 DIAGNOSIS — K449 Diaphragmatic hernia without obstruction or gangrene: Secondary | ICD-10-CM

## 2021-11-27 DIAGNOSIS — I10 Essential (primary) hypertension: Secondary | ICD-10-CM

## 2021-11-27 DIAGNOSIS — N179 Acute kidney failure, unspecified: Secondary | ICD-10-CM | POA: Diagnosis not present

## 2021-11-27 DIAGNOSIS — Q402 Other specified congenital malformations of stomach: Secondary | ICD-10-CM

## 2021-11-27 DIAGNOSIS — K5730 Diverticulosis of large intestine without perforation or abscess without bleeding: Secondary | ICD-10-CM

## 2021-11-27 DIAGNOSIS — I350 Nonrheumatic aortic (valve) stenosis: Secondary | ICD-10-CM | POA: Diagnosis not present

## 2021-11-27 DIAGNOSIS — K6289 Other specified diseases of anus and rectum: Secondary | ICD-10-CM

## 2021-11-27 DIAGNOSIS — K317 Polyp of stomach and duodenum: Secondary | ICD-10-CM

## 2021-11-27 DIAGNOSIS — N189 Chronic kidney disease, unspecified: Secondary | ICD-10-CM | POA: Diagnosis not present

## 2021-11-27 HISTORY — PX: BIOPSY: SHX5522

## 2021-11-27 HISTORY — PX: COLONOSCOPY WITH PROPOFOL: SHX5780

## 2021-11-27 HISTORY — PX: ESOPHAGOGASTRODUODENOSCOPY (EGD) WITH PROPOFOL: SHX5813

## 2021-11-27 LAB — CBC
HCT: 31.1 % — ABNORMAL LOW (ref 36.0–46.0)
Hemoglobin: 10.1 g/dL — ABNORMAL LOW (ref 12.0–15.0)
MCH: 31.7 pg (ref 26.0–34.0)
MCHC: 32.5 g/dL (ref 30.0–36.0)
MCV: 97.5 fL (ref 80.0–100.0)
Platelets: 165 10*3/uL (ref 150–400)
RBC: 3.19 MIL/uL — ABNORMAL LOW (ref 3.87–5.11)
RDW: 12.4 % (ref 11.5–15.5)
WBC: 8.4 10*3/uL (ref 4.0–10.5)
nRBC: 0 % (ref 0.0–0.2)

## 2021-11-27 LAB — ECHOCARDIOGRAM COMPLETE
Area-P 1/2: 3.17 cm2
Height: 62 in
S' Lateral: 2.9 cm
Weight: 2959.46 oz

## 2021-11-27 LAB — GLUCOSE, CAPILLARY
Glucose-Capillary: 107 mg/dL — ABNORMAL HIGH (ref 70–99)
Glucose-Capillary: 121 mg/dL — ABNORMAL HIGH (ref 70–99)

## 2021-11-27 LAB — BASIC METABOLIC PANEL
Anion gap: 9 (ref 5–15)
BUN: 28 mg/dL — ABNORMAL HIGH (ref 8–23)
CO2: 24 mmol/L (ref 22–32)
Calcium: 8.7 mg/dL — ABNORMAL LOW (ref 8.9–10.3)
Chloride: 108 mmol/L (ref 98–111)
Creatinine, Ser: 1.64 mg/dL — ABNORMAL HIGH (ref 0.44–1.00)
GFR, Estimated: 35 mL/min — ABNORMAL LOW (ref >60)
Glucose, Bld: 115 mg/dL — ABNORMAL HIGH (ref 70–99)
Potassium: 4.4 mmol/L (ref 3.5–5.1)
Sodium: 141 mmol/L (ref 135–145)

## 2021-11-27 LAB — UREA NITROGEN, URINE: Urea Nitrogen, Ur: 328 mg/dL

## 2021-11-27 SURGERY — COLONOSCOPY WITH PROPOFOL
Anesthesia: Monitor Anesthesia Care

## 2021-11-27 MED ORDER — SODIUM CHLORIDE 0.9 % IV SOLN: INTRAVENOUS | Status: DC

## 2021-11-27 MED ORDER — PROPOFOL 10 MG/ML IV BOLUS
INTRAVENOUS | Status: DC | PRN
  Administered 2021-11-27 (×2): 10 mg via INTRAVENOUS
  Administered 2021-11-27: 20 mg via INTRAVENOUS
  Administered 2021-11-27 (×2): 10 mg via INTRAVENOUS

## 2021-11-27 MED ORDER — SODIUM CHLORIDE 0.9 % IV SOLN: INTRAVENOUS | Status: DC | PRN

## 2021-11-27 MED ORDER — PROPOFOL 500 MG/50ML IV EMUL
INTRAVENOUS | Status: DC | PRN
  Administered 2021-11-27: 100 ug/kg/min via INTRAVENOUS

## 2021-11-27 SURGICAL SUPPLY — 25 items

## 2021-11-27 NOTE — Op Note (Signed)
[facility] Patient Name: Heather Dawson Procedure Date : 11/27/2021 MRN: [id number] Attending MD: Heather Dawson. Heather Dawson , MD Date of Birth: 10/02/1955 CSN: [id number] Age: 66 Admit Type: Inpatient Procedure:                Upper GI endoscopy Indications:              history of gastro-esophageal reflux disease,                            persistent diarrhea of unexplained etiology Providers:                Heather Dawson P. Heather Moros, MD, Heather Carrel, RN,                            Heather Dawson, Technician Referring MD:              Medicines:                Monitored Anesthesia Care Complications:            No immediate complications. Estimated blood loss:                            Minimal. Estimated Blood Loss:     Estimated blood loss was minimal. Procedure:                Pre-Anesthesia Assessment:                           - Prior to the procedure, a History and Physical                            was performed, and patient medications and                            allergies were reviewed. The patient's tolerance of                            previous anesthesia was also reviewed. The risks                            and benefits of the procedure and the sedation                            options and risks were discussed with the patient.                            All questions were answered, and informed consent                            was obtained. Prior Anticoagulants: The patient has                            taken no previous anticoagulant or antiplatelet                            agents.  ASA Grade Assessment: III - A patient with                            severe systemic disease. After reviewing the risks                            and benefits, the patient was deemed in                            satisfactory condition to undergo the procedure.                           After obtaining informed consent, the endoscope was                            passed  under direct vision. Throughout the                            procedure, the patient's blood pressure, pulse, and                            oxygen saturations were monitored continuously. The                            [id number]-H190 ([id number] Olympus endoscope was introduced                            through the mouth, and advanced to the second part                            of duodenum. The upper GI endoscopy was                            accomplished without difficulty. The patient                            tolerated the procedure well. Scope In: Scope Out: Findings:      Esophagogastric landmarks were identified: the Z-line was found at 34       cm, the gastroesophageal junction was found at 34 cm and the upper       extent of the gastric folds was found at 35 cm from the incisors.      A 1 cm hiatal hernia was present.      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      The exam of the esophagus was otherwise normal.      Multiple small sessile polyps were found in the gastric fundus and in       the gastric body. Suspect benign fundic gland polyps. One representative       polyp was removed with a cold biopsy forceps. Resection and retrieval       were complete.      The exam of the stomach was otherwise normal.      The examined duodenum was normal. Biopsies  for histology were taken with       a cold forceps for evaluation of celiac disease. Impression:               - Esophagogastric landmarks identified.                           - 1 cm hiatal hernia.                           - Ectopic gastric mucosa in the upper third of the                            esophagus.                           - Normal esophagus otherwise                           - Multiple gastric polyps. Resected and retrieved.                           - Normal examined duodenum. Biopsied. Recommendation:           - Return to the hospital [facility] for ongoing  care                           - Resume previous diet.                           - Continue present medications.                           - Await pathology results.                           - Recommendations per colonoscopy note Procedure Code(s):        --- Professional ---                           [phone], Esophagogastroduodenoscopy, flexible,                            transoral; with biopsy, single or multiple Diagnosis Code(s):        --- Professional ---                           K44.9, Diaphragmatic hernia without obstruction or                            gangrene                           Q40.2, Other specified congenital malformations of                            stomach  K31.7, Polyp of stomach and duodenum                           K21.9, Gastro-esophageal reflux disease without                            esophagitis                           R19.7, Diarrhea, unspecified CPT copyright 2019 [employer]. All rights reserved. The codes documented in this report are preliminary and upon coder review may  be revised to meet current compliance requirements. Heather Dawson P. Heather Michl, MD 11/27/2021 9:05:30 AM This report has been signed electronically. Number of Addenda: 0

## 2021-11-27 NOTE — Anesthesia Procedure Notes (Signed)
Procedure Name: MAC Date/Time: 11/27/2021 8:05 AM  Performed by: Harden Mo, CRNAPre-anesthesia Checklist: Patient identified, Emergency Drugs available, Suction available and Patient being monitored Patient Re-evaluated:Patient Re-evaluated prior to induction Oxygen Delivery Method: Nasal cannula Preoxygenation: Pre-oxygenation with 100% oxygen Induction Type: IV induction Placement Confirmation: positive ETCO2 and breath sounds checked- equal and bilateral Dental Injury: Teeth and Oropharynx as per pre-operative assessment

## 2021-11-27 NOTE — Transfer of Care (Signed)
Immediate Anesthesia Transfer of Care Note  Patient: Jesusa Stenerson Shuffield  Procedure(s) Performed: COLONOSCOPY WITH PROPOFOL ESOPHAGOGASTRODUODENOSCOPY (EGD) WITH PROPOFOL BIOPSY  Patient Location: PACU  Anesthesia Type:MAC  Level of Consciousness: awake, alert , and oriented  Airway & Oxygen Therapy: Patient Spontanous Breathing and Patient connected to nasal cannula oxygen  Post-op Assessment: Report given to RN, Post -op Vital signs reviewed and stable, and Patient moving all extremities X 4  Post vital signs: Reviewed and stable  Last Vitals:  Vitals Value Taken Time  BP 107/53 11/27/21 0907  Temp    Pulse 76 11/27/21 0908  Resp 17 11/27/21 0908  SpO2 96 % 11/27/21 0908  Vitals shown include unvalidated device data.  Last Pain:  Vitals:   11/27/21 0751  TempSrc: Temporal  PainSc: 0-No pain         Complications: No notable events documented.

## 2021-11-27 NOTE — Plan of Care (Signed)
Problem: Education: Goal: Knowledge of General Education information will improve Description: Including pain rating scale, medication(s)/side effects and non-pharmacologic comfort measures Outcome: Adequate for Discharge   Problem: Health Behavior/Discharge Planning: Goal: Ability to manage health-related needs will improve Outcome: Adequate for Discharge   Problem: Clinical Measurements: Goal: Ability to maintain clinical measurements within normal limits will improve Outcome: Adequate for Discharge Goal: Will remain free from infection Outcome: Adequate for Discharge Goal: Diagnostic test results will improve Outcome: Adequate for Discharge Goal: Respiratory complications will improve Outcome: Adequate for Discharge Goal: Cardiovascular complication will be avoided Outcome: Adequate for Discharge   Problem: Activity: Goal: Risk for activity intolerance will decrease Outcome: Adequate for Discharge   Problem: Nutrition: Goal: Adequate nutrition will be maintained Outcome: Adequate for Discharge   Problem: Coping: Goal: Level of anxiety will decrease Outcome: Adequate for Discharge   Problem: Elimination: Goal: Will not experience complications related to bowel motility Outcome: Adequate for Discharge Goal: Will not experience complications related to urinary retention Outcome: Adequate for Discharge   Problem: Pain Managment: Goal: General experience of comfort will improve Outcome: Adequate for Discharge   Problem: Safety: Goal: Ability to remain free from injury will improve Outcome: Adequate for Discharge   Problem: Skin Integrity: Goal: Risk for impaired skin integrity will decrease Outcome: Adequate for Discharge   Problem: Education: Goal: Knowledge of General Education information will improve Description: Including pain rating scale, medication(s)/side effects and non-pharmacologic comfort measures Outcome: Adequate for Discharge   Problem: Health  Behavior/Discharge Planning: Goal: Ability to manage health-related needs will improve Outcome: Adequate for Discharge   Problem: Clinical Measurements: Goal: Ability to maintain clinical measurements within normal limits will improve Outcome: Adequate for Discharge Goal: Will remain free from infection Outcome: Adequate for Discharge Goal: Diagnostic test results will improve Outcome: Adequate for Discharge Goal: Respiratory complications will improve Outcome: Adequate for Discharge Goal: Cardiovascular complication will be avoided Outcome: Adequate for Discharge   Problem: Activity: Goal: Risk for activity intolerance will decrease Outcome: Adequate for Discharge   Problem: Nutrition: Goal: Adequate nutrition will be maintained Outcome: Adequate for Discharge   Problem: Coping: Goal: Level of anxiety will decrease Outcome: Adequate for Discharge   Problem: Elimination: Goal: Will not experience complications related to bowel motility Outcome: Adequate for Discharge Goal: Will not experience complications related to urinary retention Outcome: Adequate for Discharge   Problem: Pain Managment: Goal: General experience of comfort will improve Outcome: Adequate for Discharge   Problem: Safety: Goal: Ability to remain free from injury will improve Outcome: Adequate for Discharge   Problem: Skin Integrity: Goal: Risk for impaired skin integrity will decrease Outcome: Adequate for Discharge   Problem: Education: Goal: Ability to describe self-care measures that may prevent or decrease complications (Diabetes Survival Skills Education) will improve Outcome: Adequate for Discharge Goal: Individualized Educational Video(s) Outcome: Adequate for Discharge   Problem: Coping: Goal: Ability to adjust to condition or change in health will improve Outcome: Adequate for Discharge   Problem: Fluid Volume: Goal: Ability to maintain a balanced intake and output will  improve Outcome: Adequate for Discharge   Problem: Health Behavior/Discharge Planning: Goal: Ability to identify and utilize available resources and services will improve Outcome: Adequate for Discharge Goal: Ability to manage health-related needs will improve Outcome: Adequate for Discharge   Problem: Metabolic: Goal: Ability to maintain appropriate glucose levels will improve Outcome: Adequate for Discharge   Problem: Nutritional: Goal: Maintenance of adequate nutrition will improve Outcome: Adequate for Discharge Goal: Progress toward achieving an optimal  weight will improve Outcome: Adequate for Discharge   Problem: Skin Integrity: Goal: Risk for impaired skin integrity will decrease Outcome: Adequate for Discharge   Problem: Tissue Perfusion: Goal: Adequacy of tissue perfusion will improve Outcome: Adequate for Discharge

## 2021-11-27 NOTE — Plan of Care (Signed)
  Problem: Clinical Measurements: Goal: Will remain free from infection Outcome: Progressing   Problem: Activity: Goal: Risk for activity intolerance will decrease Outcome: Progressing   Problem: Nutrition: Goal: Adequate nutrition will be maintained Outcome: Progressing   Problem: Coping: Goal: Level of anxiety will decrease Outcome: Progressing   Problem: Pain Managment: Goal: General experience of comfort will improve Outcome: Progressing   Problem: Safety: Goal: Ability to remain free from injury will improve Outcome: Progressing   Problem: Skin Integrity: Goal: Risk for impaired skin integrity will decrease Outcome: Progressing

## 2021-11-27 NOTE — Discharge Summary (Signed)
Physician Discharge Summary  Heather Dawson TDS:[id number] DOB: [birth year] DOA: 11/24/2021  PCP: System, Provider Not In  Admit date: 11/24/2021 Discharge date: 11/27/2021  Admitted From: Home Disposition:  Home  Discharge Condition:Stable CODE STATUS:FULL Diet recommendation:  Carb Modified  Brief/Interim Summary:  Patient is a 66 year old female with history of hypertension, hyperlipidemia, TIA, CKD stage IV with baseline creatinine ranging from 2-2.2, hypothyroidism, C. difficile colitis, GERD who presented to the emergency department at [facility] for the evaluation of worsening kidney function found during lab work for preparation of colonoscopy/EGD.  She was having persistent diarrhea at home reporting 3 or more liquid bowel movement per day.  Also complained of heartburn, epigastric discomfort, lower extremity swelling, confusion.  On presentation she was mildly hypotensive but blood pressure improved with IV fluids.  Lab work showed leukocytosis of 17.1, creatinine of 3.38.  Patient was admitted for further work-up.  Her kidney function and leukocytosis significantly improved.  Patient was also seen by GI during this hospitalization, underwent EGD and colonoscopy without any significant findings.  Her diarrhea has stopped , abdomen pain has resolved.  Medically stable for discharge home today.  Following problems were addressed during her hospitalization:  AKI on CKD stage IV: Found to have elevated creatinine from baseline.  On presentation creatinine was 3.3.  Baseline creatinine ranges from 2-2.2.  Kidney function is better than baseline with IV fluids.  Follow with [facility].    Leukocytosis: Resolved.   Diarrhea: C. difficile, GI pathogen panel negative.  Diarrhea has resolved   History of hypertension: Initially hypotensive on presentation, now blood pressure stable.  Was taking chlorthalidone, lisinopril at home, now on hold as per her nephrologist.  Taking hydralazine and  amlodipine   Epigastric abdominal pain: GI already following.  Lipase was mildly elevated at 66.  Could be gastritis, enteritis or peptic ulcer disease , low suspicion for pancreatitis.  .  CT abdomen/pelvis showed mild wall thickening within the distal small bowel without focal inflammation or stranding suspicious for  enteritis. Her abdominal pain has resolved now.  She is tolerating solid diet.  Colonoscopy showed diverticulosis.  EGD showed multiple gastric polyps, s/p resection.  GI cleared for discharge.   Confusion: Could be secondary to AKI.  Completely resolved, remains alert and oriented   Diabetes type 2: A1c of 6.3 as of 8/18.  Continue home insulin regimen.    Hypothyroidism: Continue Synthyroid   GERD: Continue Protonix   Obesity: BMI of 33.8   Discharge Diagnoses:  Principal Problem:   Acute kidney injury superimposed on chronic kidney disease ([place]) Active Problems:   Leukocytosis   Essential hypertension   Diarrhea   Abdominal pain, epigastric   Transient hypotension   Hypothyroidism   Psoriasis   GERD (gastroesophageal reflux disease)   Diabetes mellitus type 2 in obese (HCC)   Obesity (BMI 30-39.9)    Discharge Instructions  Discharge Instructions     Diet Carb Modified   Complete by: As directed    Discharge instructions   Complete by: As directed    1)Please take your medications as instructed 2)Follow up with your PCP in a week.   Increase activity slowly   Complete by: As directed       Allergies as of 11/27/2021       Reactions   Macrodantin [nitrofurantoin Macrocrystal] Shortness Of Breath, Diarrhea, Other (See Comments)   GI upset   Adipex-p [phentermine] Other (See Comments)   "All the side effects listed on the paper"  Asa [aspirin] Other (See Comments)   Told to avoid due to kidney function   Bactrim [sulfamethoxazole-trimethoprim] Nausea And Vomiting   Cipro [ciprofloxacin Hcl] Swelling   Levaquin [levofloxacin In D5w] Other  (See Comments)   GI upset   Lipitor [atorvastatin] Other (See Comments)   Myalgia   Lyrica [pregabalin] Swelling, Other (See Comments)   Headaches Confusion   Nsaids Other (See Comments)   Told to avoid due to kidney function   Vibramycin [doxycycline] Nausea Only   Zinacef [cefuroxime] Swelling   Neurontin [gabapentin] Rash        Medication List     STOP taking these medications    lisinopril 20 MG tablet Commonly known as: ZESTRIL   Sutab [phone] MG Tabs Generic drug: Sodium Sulfate-Mag Sulfate-KCl       TAKE these medications    acetaminophen 500 MG tablet Commonly known as: TYLENOL Take 1,000 mg by mouth every 8 (eight) hours as needed (pain).   amLODipine 10 MG tablet Commonly known as: NORVASC 1 tab by mouth once daily What changed:  how much to take how to take this when to take this additional instructions   aspirin EC 81 MG tablet Take 81 mg by mouth daily.   clobetasol 0.05 % external solution Commonly known as: TEMOVATE Apply 1 application topically daily as needed (scalp psoriasis).   estradiol 0.1 MG/GM vaginal cream Commonly known as: ESTRACE Place 1 Applicatorful vaginally See admin instructions. Apply to vagina twice weekly on Monday, Thursday.   hydrALAZINE 100 MG tablet Commonly known as: APRESOLINE Take 1 tablet (100 mg total) by mouth 3 (three) times daily.   levocetirizine 5 MG tablet Commonly known as: XYZAL Take 5 mg by mouth every evening.   levothyroxine 75 MCG tablet Commonly known as: SYNTHROID Take 1 tablet (75 mcg total) by mouth daily.   lovastatin 40 MG tablet Commonly known as: MEVACOR Take 2 tablets (80 mg total) by mouth at bedtime.   pantoprazole 40 MG tablet Commonly known as: PROTONIX Take 1 tablet (40 mg total) by mouth 2 (two) times daily before a meal. What changed: when to take this   triamcinolone cream 0.1 % Commonly known as: KENALOG Apply 1 application  topically daily as needed  (psoriasis).   TURMERIC CURCUMIN PO Take 1 tablet by mouth daily.   vitamin C 1000 MG tablet Take 1,000 mg by mouth daily.   Vitamin D3 25 MCG (1000 UT) Caps Take 2 capsules (2,000 Units total) by mouth daily.        Allergies  Allergen Reactions   Macrodantin [Nitrofurantoin Macrocrystal] Shortness Of Breath, Diarrhea and Other (See Comments)    GI upset   Adipex-P [Phentermine] Other (See Comments)    "All the side effects listed on the paper"   Asa [Aspirin] Other (See Comments)    Told to avoid due to kidney function   Bactrim [Sulfamethoxazole-Trimethoprim] Nausea And Vomiting   Cipro [Ciprofloxacin Hcl] Swelling   Levaquin [Levofloxacin In D5w] Other (See Comments)    GI upset   Lipitor [Atorvastatin] Other (See Comments)    Myalgia    Lyrica [Pregabalin] Swelling and Other (See Comments)    Headaches Confusion   Nsaids Other (See Comments)    Told to avoid due to kidney function   Vibramycin [Doxycycline] Nausea Only   Zinacef [Cefuroxime] Swelling   Neurontin [Gabapentin] Rash    Consultations: Gastroenterology   Procedures/Studies: CT ABDOMEN PELVIS WO CONTRAST  Result Date: 11/25/2021 CLINICAL DATA:  Diarrhea.  Intestinal  malabsorption. EXAM: CT ABDOMEN AND PELVIS WITHOUT CONTRAST TECHNIQUE: Multidetector CT imaging of the abdomen and pelvis was performed following the standard protocol without IV contrast. RADIATION DOSE REDUCTION: This exam was performed according to the departmental dose-optimization program which includes automated exposure control, adjustment of the mA and/or kV according to patient size and/or use of iterative reconstruction technique. COMPARISON:  CT head without scratched at CT abdomen pelvis without contrast 10/12/2020 FINDINGS: Lower chest: The lung bases are clear without focal nodule, mass, or airspace disease. The heart size is normal. No significant pleural or pericardial effusion is present. Hepatobiliary: No focal liver  abnormality is seen. Status post cholecystectomy. No biliary dilatation. Pancreas: Unremarkable. No pancreatic ductal dilatation or surrounding inflammatory changes. Spleen: Normal in size without focal abnormality. Adrenals/Urinary Tract: Adrenal glands are normal bilaterally. Asymmetric right-sided renal atrophy again noted. No stone or mass lesion is present. Ureters are within normal limits. No obstruction is present. The urinary bladder is normal. Stomach/Bowel: The stomach and duodenum are within normal limits. There is some wall thickening within the distal small bowel. No focal inflammation or stranding is present. The appendix is visualized and normal. The ascending and transverse colon are within normal limits. Descending and sigmoid colon are normal. Vascular/Lymphatic: Atherosclerotic calcifications are present in the aorta and branch vessels. No aneurysm is present. No significant adenopathy is present. Reproductive: Status post hysterectomy. No adnexal masses. Other: Paraumbilical hernia contains fat but no bowel. Opening is 18 mm, stable no other significant ventral hernias are present. No free fluid or free air is present. Musculoskeletal: Vertebral body heights and alignment are normal. No focal osseous lesions are present. Lumbar disc disease is noted at L2-3 and L3-4. Chronic loss of disc height and endplate spurring is present at L5-S1. Bony pelvis is within normal limits. The hips are located and within normal limits. IMPRESSION: 1. Mild wall thickening within the distal small bowel without focal inflammation or stranding. This may represent a nonspecific enteritis. Involvement of the terminal ileum can be seen with inflammatory bowel disease. 2. No other acute or focal lesion to explain the patient's symptoms. 3. Stable periumbilical hernia containing fat but no bowel. 4. Stable asymmetric right-sided renal atrophy. 5. Cholecystectomy and hysterectomy. 6. Multilevel degenerative changes in the  lumbar spine. 7.  Aortic Atherosclerosis (ICD10-I70.0). Electronically Signed   By: San Morelle M.D.   On: 11/25/2021 11:54      Subjective: Patient seen and examined at bedside today.  Hemodynamically stable for discharge.  Discharge Exam: Vitals:   11/27/21 0931 11/27/21 1008  BP: 101/85 138/70  Pulse: 67 (!) 53  Resp: 14 16  Temp: (!) 97.3 F (36.3 C) 97.9 F (36.6 C)  SpO2: 96% 99%   Vitals:   11/27/21 0907 11/27/21 0915 11/27/21 0931 11/27/21 1008  BP: (!) 107/53 (!) 128/57 101/85 138/70  Pulse: 78 69 67 (!) 53  Resp: 16 (!) 22 14 16   Temp: (!) 97.3 F (36.3 C)  (!) 97.3 F (36.3 C) 97.9 F (36.6 C)  TempSrc:    Oral  SpO2: 97% 98% 96% 99%  Weight:      Height:        General: Pt is alert, awake, not in acute distress Cardiovascular: RRR, S1/S2 +, no rubs, no gallops Respiratory: CTA bilaterally, no wheezing, no rhonchi Abdominal: Soft, NT, ND, bowel sounds + Extremities: no edema, no cyanosis    The results of significant diagnostics from this hospitalization (including imaging, microbiology, ancillary and laboratory) are listed below  for reference.     Microbiology: Recent Results (from the past 240 hour(s))  Gastrointestinal Panel by PCR , Stool     Status: None   Collection Time: 11/25/21  8:17 AM   Specimen: Stool  Result Value Ref Range Status   Campylobacter species NOT DETECTED NOT DETECTED Final   Plesimonas shigelloides NOT DETECTED NOT DETECTED Final   Salmonella species NOT DETECTED NOT DETECTED Final   Yersinia enterocolitica NOT DETECTED NOT DETECTED Final   Vibrio species NOT DETECTED NOT DETECTED Final   Vibrio cholerae NOT DETECTED NOT DETECTED Final   Enteroaggregative E coli (EAEC) NOT DETECTED NOT DETECTED Final   Enteropathogenic E coli (EPEC) NOT DETECTED NOT DETECTED Final   Enterotoxigenic E coli (ETEC) NOT DETECTED NOT DETECTED Final   Shiga like toxin producing E coli (STEC) NOT DETECTED NOT DETECTED Final    Shigella/Enteroinvasive E coli (EIEC) NOT DETECTED NOT DETECTED Final   Cryptosporidium NOT DETECTED NOT DETECTED Final   Cyclospora cayetanensis NOT DETECTED NOT DETECTED Final   Entamoeba histolytica NOT DETECTED NOT DETECTED Final   Giardia lamblia NOT DETECTED NOT DETECTED Final   Adenovirus F40/41 NOT DETECTED NOT DETECTED Final   Astrovirus NOT DETECTED NOT DETECTED Final   Norovirus GI/GII NOT DETECTED NOT DETECTED Final   Rotavirus A NOT DETECTED NOT DETECTED Final   Sapovirus (I, II, IV, and V) NOT DETECTED NOT DETECTED Final    Comment: Performed at [facility], [address] [place], [place] [address]  C Difficile Quick Screen w PCR reflex     Status: None   Collection Time: 11/25/21  1:15 PM   Specimen: Stool  Result Value Ref Range Status   C Diff antigen NEGATIVE NEGATIVE Final   C Diff toxin NEGATIVE NEGATIVE Final   C Diff interpretation No C. difficile detected.  Final    Comment: Performed at [facility], [place] [address], [place], [place] [address]     Labs: BNP (last 3 results) No results for input(s): "BNP" in the last 8760 hours. Basic Metabolic Panel: Recent Labs  Lab 11/23/21 1438 11/24/21 2153 11/25/21 0956 11/26/21 0317 11/27/21 0158  NA 133* 134* 134* 139 141  K 4.3 4.1 4.3 4.8 4.4  CL 99 101 107 113* 108  CO2 22 19* 18* 21* 24  GLUCOSE 188* 220* 246* 180* 115*  BUN 79* 88* 78* 57* 28*  CREATININE 3.24* 3.38* 2.62* 2.39* 1.64*  CALCIUM 8.8 8.7* 8.3* 8.5* 8.7*   Liver Function Tests: Recent Labs  Lab 11/24/21 2153  AST 10*  ALT 13  ALKPHOS 54  BILITOT 0.3  PROT 5.7*  ALBUMIN 3.9   Recent Labs  Lab 11/24/21 2153  LIPASE 66*   No results for input(s): "AMMONIA" in the last 168 hours. CBC: Recent Labs  Lab 11/23/21 1438 11/24/21 1945 11/26/21 0317 11/27/21 0158  WBC 14.0* 17.1* 8.8 8.4  NEUTROABS 12.0*  --   --   --   HGB 13.0 13.4 10.8* 10.1*  HCT 39.3 38.8 32.3* 31.1*  MCV 94.7 93.7 95.6 97.5  PLT 272.0  271 168 165   Cardiac Enzymes: No results for input(s): "CKTOTAL", "CKMB", "CKMBINDEX", "TROPONINI" in the last 168 hours. BNP: Invalid input(s): "POCBNP" CBG: Recent Labs  Lab 11/26/21 1150 11/26/21 1624 11/26/21 2132 11/27/21 0754 11/27/21 0908  GLUCAP 103* 116* 112* 107* 121*   D-Dimer No results for input(s): "DDIMER" in the last 72 hours. Hgb A1c No results for input(s): "HGBA1C" in the last 72  hours. Lipid Profile No results for input(s): "CHOL", "HDL", "LDLCALC", "TRIG", "CHOLHDL", "LDLDIRECT" in the last 72 hours. Thyroid function studies No results for input(s): "TSH", "T4TOTAL", "T3FREE", "THYROIDAB" in the last 72 hours.  Invalid input(s): "FREET3" Anemia work up No results for input(s): "VITAMINB12", "FOLATE", "FERRITIN", "TIBC", "IRON", "RETICCTPCT" in the last 72 hours. Urinalysis    Component Value Date/Time   COLORURINE YELLOW 11/24/2021 1945   APPEARANCEUR CLEAR 11/24/2021 1945   LABSPEC 1.009 11/24/2021 1945   PHURINE 5.0 11/24/2021 1945   GLUCOSEU NEGATIVE 11/24/2021 1945   GLUCOSEU NEGATIVE 06/07/2021 [place] 11/24/2021 1945   HGBUR negative 01/10/2009 [place] 11/24/2021 1945   BILIRUBINUR neg 04/12/2020 [place] 11/24/2021 1945   PROTEINUR NEGATIVE 11/24/2021 1945   UROBILINOGEN 1.0 06/07/2021 1058   NITRITE NEGATIVE 11/24/2021 1945   LEUKOCYTESUR NEGATIVE 11/24/2021 1945   Sepsis Labs Recent Labs  Lab 11/23/21 1438 11/24/21 1945 11/26/21 0317 11/27/21 0158  WBC 14.0* 17.1* 8.8 8.4   Microbiology Recent Results (from the past 240 hour(s))  Gastrointestinal Panel by PCR , Stool     Status: None   Collection Time: 11/25/21  8:17 AM   Specimen: Stool  Result Value Ref Range Status   Campylobacter species NOT DETECTED NOT DETECTED Final   Plesimonas shigelloides NOT DETECTED NOT DETECTED Final   Salmonella species NOT DETECTED NOT DETECTED Final   Yersinia enterocolitica NOT DETECTED  NOT DETECTED Final   Vibrio species NOT DETECTED NOT DETECTED Final   Vibrio cholerae NOT DETECTED NOT DETECTED Final   Enteroaggregative E coli (EAEC) NOT DETECTED NOT DETECTED Final   Enteropathogenic E coli (EPEC) NOT DETECTED NOT DETECTED Final   Enterotoxigenic E coli (ETEC) NOT DETECTED NOT DETECTED Final   Shiga like toxin producing E coli (STEC) NOT DETECTED NOT DETECTED Final   Shigella/Enteroinvasive E coli (EIEC) NOT DETECTED NOT DETECTED Final   Cryptosporidium NOT DETECTED NOT DETECTED Final   Cyclospora cayetanensis NOT DETECTED NOT DETECTED Final   Entamoeba histolytica NOT DETECTED NOT DETECTED Final   Giardia lamblia NOT DETECTED NOT DETECTED Final   Adenovirus F40/41 NOT DETECTED NOT DETECTED Final   Astrovirus NOT DETECTED NOT DETECTED Final   Norovirus GI/GII NOT DETECTED NOT DETECTED Final   Rotavirus A NOT DETECTED NOT DETECTED Final   Sapovirus (I, II, IV, and V) NOT DETECTED NOT DETECTED Final    Comment: Performed at [facility], [address] [place], [place] [address]  C Difficile Quick Screen w PCR reflex     Status: None   Collection Time: 11/25/21  1:15 PM   Specimen: Stool  Result Value Ref Range Status   C Diff antigen NEGATIVE NEGATIVE Final   C Diff toxin NEGATIVE NEGATIVE Final   C Diff interpretation No C. difficile detected.  Final    Comment: Performed at [facility], [place] [address] [place], [place] [address]    Please note: You were cared for by a hospitalist during your hospital stay. Once you are discharged, your primary care physician will handle any further medical issues. Please note that NO REFILLS for any discharge medications will be authorized once you are discharged, as it is imperative that you return to your primary care physician (or establish a relationship with a primary care physician if you do not have one) for your post hospital discharge needs so that they can reassess your need for medications and monitor  your lab values.  Time coordinating discharge: 40 minutes  SIGNED:   Shelly Coss, MD  [facility] 11/27/2021, 10:49 AM Pager [phone]  If 7PM-7AM, please contact night-coverage [email or web address] Password [email or web address]

## 2021-11-27 NOTE — Interval H&P Note (Signed)
History and Physical Interval Note: Patient feeling well this AM. She reports diarrhea has significantly improved since she has been in the hospital. Did okay with prep. EGD and colonoscopy to evaluate refractory diarrhea and ongoing GERD symptoms. I have discussed risks / benefits of EGD, colonoscopy, and anesthesia with her and she wishes to proceed. Otherwise no interval changes from yesterday. She denies any cardiopulmonary symptoms. She wants to go home later today if no high risk pathology on her exams.  11/27/2021 7:32 AM  Michae Kava Wynes  has presented today for surgery, with the diagnosis of Diarrhea and GERD.  The various methods of treatment have been discussed with the patient and family. After consideration of risks, benefits and other options for treatment, the patient has consented to  Procedure(s): COLONOSCOPY WITH PROPOFOL (N/A) ESOPHAGOGASTRODUODENOSCOPY (EGD) WITH PROPOFOL (N/A) as a surgical intervention.  The patient's history has been reviewed, patient examined, no change in status, stable for surgery.  I have reviewed the patient's chart and labs.  Questions were answered to the patient's satisfaction.     Farnam

## 2021-11-27 NOTE — Anesthesia Postprocedure Evaluation (Signed)
Anesthesia Post Note  Patient: Heather Dawson  Procedure(s) Performed: COLONOSCOPY WITH PROPOFOL ESOPHAGOGASTRODUODENOSCOPY (EGD) WITH PROPOFOL BIOPSY     Patient location during evaluation: PACU Anesthesia Type: MAC Level of consciousness: awake and alert, patient cooperative and oriented Pain management: pain level controlled Vital Signs Assessment: post-procedure vital signs reviewed and stable Respiratory status: spontaneous breathing, nonlabored ventilation, respiratory function stable and patient connected to nasal cannula oxygen Cardiovascular status: blood pressure returned to baseline and stable Postop Assessment: no apparent nausea or vomiting Anesthetic complications: no   No notable events documented.  Last Vitals:  Vitals:   11/27/21 0915 11/27/21 0931  BP: (!) 128/57 101/85  Pulse: 69 67  Resp: (!) 22 14  Temp:  (!) 36.3 C  SpO2: 98% 96%    Last Pain:  Vitals:   11/27/21 0915  TempSrc:   PainSc: 0-No pain                 Eudell Mcphee,E. Candi Profit

## 2021-11-27 NOTE — Op Note (Addendum)
[facility] Patient Name: Heather Dawson Procedure Date : 11/27/2021 MRN: [id number] Attending MD: Carlota Raspberry. Havery Moros , MD Date of Birth: 1955/11/10 CSN: [id number] Age: 66 Admit Type: Inpatient Procedure:                Colonoscopy Indications:              Clinically significant diarrhea of unexplained                            origin - negative infectious workup, worsening led                            to hospitalization. CT shows ileal thickening. Providers:                Carlota Raspberry. Havery Moros, MD, Jaci Carrel, RN,                            Darliss Cheney, Technician Referring MD:              Medicines:                Monitored Anesthesia Care Complications:            No immediate complications. Estimated blood loss:                            Minimal. Estimated Blood Loss:     Estimated blood loss was minimal. Procedure:                Pre-Anesthesia Assessment:                           - Prior to the procedure, a History and Physical                            was performed, and patient medications and                            allergies were reviewed. The patient's tolerance of                            previous anesthesia was also reviewed. The risks                            and benefits of the procedure and the sedation                            options and risks were discussed with the patient.                            All questions were answered, and informed consent                            was obtained. Prior Anticoagulants: The patient has                            taken  no previous anticoagulant or antiplatelet                            agents. ASA Grade Assessment: III - A patient with                            severe systemic disease. After reviewing the risks                            and benefits, the patient was deemed in                            satisfactory condition to undergo the procedure.                           After  obtaining informed consent, the colonoscope                            was passed under direct vision. Throughout the                            procedure, the patient's blood pressure, pulse, and                            oxygen saturations were monitored continuously. The                            [id number]-HQ190TL ([id number]) Olympus peds colonoscope was                            introduced through the anus and advanced to the the                            terminal ileum, with identification of the                            appendiceal orifice and IC valve. The colonoscopy                            was performed without difficulty. The patient                            tolerated the procedure well. The quality of the                            bowel preparation was good. The terminal ileum,                            ileocecal valve, appendiceal orifice, and rectum                            were photographed. The CF-[id number] ([id number]) Olympus  coloscope was introduced through the and advanced                            to the. Scope In: 8:25:25 AM Scope Out: 8:59:08 AM Scope Withdrawal Time: 0 hours 9 minutes 25 seconds  Total Procedure Duration: 0 hours 33 minutes 43 seconds  Findings:      The perianal and digital rectal examinations were normal.      The terminal ileum appeared normal.      A few small-mouthed diverticula were found in the sigmoid colon.      The colon revealed excessive looping with the pediatric colonoscope       which was used initially. Despite positional changes and abdominal       pressure, pediatric colonoscope was not successful in achieving       intubation and prolonged the exam. This scope was removed, adult scope       used for cecal intubation which worked much better for this purpose.      Anal papilla(e) were hypertrophied.      The exam was otherwise without abnormality.      Biopsies for histology were taken with a cold  forceps from the right       colon, left colon and transverse colon for evaluation of microscopic       colitis. Impression:               - The examined portion of the ileum was normal.                           - Diverticulosis in the sigmoid colon.                           - There was significant looping of the colon with                            the pediatric colonoscope.                           - Anal papilla(e) were hypertrophied.                           - The examination was otherwise normal.                           - Biopsies were taken with a cold forceps from the                            right colon, left colon and transverse colon for                            evaluation of microscopic colitis.                           No overt cause for symptoms on this exam, biopsies                            taken to rule out microscopic colitis which remains  possible. Patient is much better at this time,                            requesting to go home today which I think is                            reasonable. Would place on scheduled immodium as                            she has not had that previously. Can add                            cholestyramine or colestid empirically if symptoms                            worsen while biopsies pending. Recommendation:           - Return patient to hospital [facility] for ongoing care.                            I think okay for discharge home today                           - Resume previous diet.                           - Continue present medications.                           - Start immodium 2 tabs q AM and additional tablets                            as needed                           - Consider starting empiric colestid or                            cholestyramine if symptoms persist despite immodium                            while pathology results pending                           - Await  pathology results with further                            recommendations                           - Our office will call to coordinate outpatient                            follow up Procedure Code(s):        --- Professional ---  45380, Colonoscopy, flexible; with biopsy, single                            or multiple Diagnosis Code(s):        --- Professional ---                           K62.89, Other specified diseases of anus and rectum                           R19.7, Diarrhea, unspecified                           K57.30, Diverticulosis of large intestine without                            perforation or abscess without bleeding CPT copyright 2019 American Medical Association. All rights reserved. The codes documented in this report are preliminary and upon coder review may  be revised to meet current compliance requirements. Remo Lipps P. Jakobee Brackins, MD 11/27/2021 9:12:07 AM This report has been signed electronically. Number of Addenda: 0

## 2021-11-27 NOTE — Telephone Encounter (Signed)
Chart reviewed. Pt did proceed to the ED and receive IV fluids as advised:

## 2021-11-28 ENCOUNTER — Encounter (HOSPITAL_COMMUNITY): Payer: Self-pay | Admitting: Gastroenterology

## 2021-11-28 LAB — SURGICAL PATHOLOGY

## 2021-11-29 ENCOUNTER — Ambulatory Visit: Payer: Medicare Other | Admitting: Internal Medicine

## 2021-12-01 ENCOUNTER — Telehealth: Payer: Self-pay | Admitting: *Deleted

## 2021-12-01 ENCOUNTER — Encounter: Payer: Self-pay | Admitting: *Deleted

## 2021-12-01 NOTE — Patient Outreach (Signed)
  Care Coordination [facility] Note Transition Care Management Unsuccessful Follow-up Telephone Call  Date of discharge and from where:  Monday, November 27, 2021- [place]; AKI; diarrhea, endoscopy/ colonoscopy  Attempts:  1st Attempt  Reason for unsuccessful TCM follow-up call:  Left voice message- attempted calls to both numbers on file for patient; left voice message on mobile number; home phone rang without physical or voice mail pick up  Oneta Rack, RN, BSN, [place] RN CM Care Coordination/ Transition of [place] Management [phone]: direct office

## 2021-12-04 ENCOUNTER — Telehealth: Payer: Self-pay | Admitting: *Deleted

## 2021-12-04 NOTE — Patient Outreach (Addendum)
  Care Coordination [facility] Note Transition Care Management Unsuccessful Follow-up Telephone Call  Date of discharge and from where:  [facility] [phone]   Attempts:  2nd Attempt  Reason for unsuccessful TCM follow-up call:  Left voice message  [place] [facility] [phone]

## 2021-12-04 NOTE — Patient Outreach (Signed)
  Care Coordination TOC Note Transition Care Management Follow-up Telephone Call Date of discharge and from where: [phone] [facility] How have you been since you were released from the hospital? I am still having a lot of burning when I eat. I am having to eat small amounts. RN discussed also eating sitting up and not lie down for at least 1-2 hours after meals Any questions or concerns? No  Items Reviewed: Did the pt receive and understand the discharge instructions provided? Yes  Medications obtained and verified? Yes  Other? No  Any new allergies since your discharge? No  Dietary orders reviewed? Yes Carb Modifies Do you have support at home? Yes   Home Care and Equipment/Supplies: Were home health services ordered? no If so, what is the name of the agency? N/a  Has the agency set up a time to come to the patient's home? no Were any new equipment or medical supplies ordered?  No What is the name of the medical supply agency? N/a Were you able to get the supplies/equipment? no Do you have any questions related to the use of the equipment or supplies? N  Functional Questionnaire: (I = Independent and D = Dependent) ADLs: I  Bathing/Dressing- I  Meal Prep- I  Eating- I  Maintaining continence- I  Transferring/Ambulation- I  Managing Meds- I  Follow up appointments reviewed:  PCP Hospital f/u appt confirmed? Yes  Scheduled to see Dr Jenny Reichmann [phone] 8:30 Specialist Hospital f/u appt confirmed? Yes  Scheduled to see Dr Scarlette Shorts [phone] 9:00 Are transportation arrangements needed? No  If their condition worsens, is the pt aware to call PCP or go to the Emergency Dept.? Yes Was the patient provided with contact information for the PCP's office or ED? Yes Was to pt encouraged to call back with questions or concerns? Yes  SDOH assessments and interventions completed:   Yes  Care Coordination Interventions Activated:  Yes   Care Coordination Interventions:  No Care Coordination  interventions needed at this time.   Encounter Outcome:  Pt. Visit Completed    [place] [phone]

## 2021-12-05 ENCOUNTER — Ambulatory Visit (INDEPENDENT_AMBULATORY_CARE_PROVIDER_SITE_OTHER): Payer: PPO | Admitting: Internal Medicine

## 2021-12-05 VITALS — BP 168/94 | HR 72 | Temp 97.9°F | Ht 62.0 in | Wt 186.0 lb

## 2021-12-05 DIAGNOSIS — Z23 Encounter for immunization: Secondary | ICD-10-CM

## 2021-12-05 DIAGNOSIS — K219 Gastro-esophageal reflux disease without esophagitis: Secondary | ICD-10-CM | POA: Diagnosis not present

## 2021-12-05 DIAGNOSIS — N189 Chronic kidney disease, unspecified: Secondary | ICD-10-CM | POA: Diagnosis not present

## 2021-12-05 DIAGNOSIS — N179 Acute kidney failure, unspecified: Secondary | ICD-10-CM

## 2021-12-05 DIAGNOSIS — I10 Essential (primary) hypertension: Secondary | ICD-10-CM

## 2021-12-05 DIAGNOSIS — R600 Localized edema: Secondary | ICD-10-CM

## 2021-12-05 DIAGNOSIS — N184 Chronic kidney disease, stage 4 (severe): Secondary | ICD-10-CM | POA: Diagnosis not present

## 2021-12-05 DIAGNOSIS — E669 Obesity, unspecified: Secondary | ICD-10-CM

## 2021-12-05 DIAGNOSIS — E1169 Type 2 diabetes mellitus with other specified complication: Secondary | ICD-10-CM | POA: Diagnosis not present

## 2021-12-05 LAB — BASIC METABOLIC PANEL
BUN: 16 mg/dL (ref 6–23)
CO2: 26 mEq/L (ref 19–32)
Calcium: 9.6 mg/dL (ref 8.4–10.5)
Chloride: 104 mEq/L (ref 96–112)
Creatinine, Ser: 1.7 mg/dL — ABNORMAL HIGH (ref 0.40–1.20)
GFR: 31.38 mL/min — ABNORMAL LOW (ref >60.00)
Glucose, Bld: 137 mg/dL — ABNORMAL HIGH (ref 70–99)
Potassium: 3.7 mEq/L (ref 3.5–5.1)
Sodium: 139 mEq/L (ref 135–145)

## 2021-12-05 MED ORDER — FAMOTIDINE 20 MG PO TABS: 20.0000 mg | ORAL_TABLET | Freq: Two times a day (BID) | ORAL | 3 refills | Status: DC

## 2021-12-05 MED ORDER — METOPROLOL SUCCINATE ER 50 MG PO TB24: 50.0000 mg | ORAL_TABLET | Freq: Every day | ORAL | 3 refills | Status: DC

## 2021-12-05 NOTE — Patient Instructions (Addendum)
You had the flu shot today  Please take all new medication as prescribed - the pepcid 20 mg twice per day  Ok to stay off the lisinopril for now  Please take all new medication as prescribed - the toprol xL 50 mg per day for blood pressure  Please call in 7--10 days if your BP is not at least less than 140/90 (and note the heart rate as well)  Please continue all other medications as before, and refills have been done if requested.  Please have the pharmacy call with any other refills you may need.  Please continue your efforts at being more active, low cholesterol diet, and weight control  Please keep your appointments with your specialists as you may have planned - kidney doctor in december  Please go to the LAB at the blood drawing area for the tests to be done  You will be contacted by phone if any changes need to be made immediately.  Otherwise, you will receive a letter about your results with an explanation, but please check with MyChart first.  Please remember to sign up for MyChart if you have not done so, as this will be important to you in the future with finding out test results, communicating by private email, and scheduling acute appointments online when needed.  Please make an Appointment to return in 4 months, or sooner if needed

## 2021-12-05 NOTE — Progress Notes (Unsigned)
Patient ID: Heather Dawson, female   DOB: 04-12-1955, 66 y.o.   MRN: [id number]        Chief Complaint: follow up recent hospn oct 6 - 9 with low volume       HPI:  Heather Dawson is a 66 y.o. female here with rent low volume and AKI after started new diuretic per renal per pt, but also off lsinopril and diuretic for now, also had diarrhea and abd pain at the outset and had EGD and colonoscopy negative for acute findings.  Does have persistent reflux not controlled with bid PPi.  Pt denies chest pain, increased sob or doe, wheezing, orthopnea, PND, increased LE swelling, palpitations, dizziness or syncope.   Pt denies polydipsia, polyuria, or new focal neuro s/s.          Wt Readings from Last 3 Encounters:  12/05/21 186 lb (84.4 kg)  11/27/21 184 lb 15.5 oz (83.9 kg)  11/23/21 185 lb (83.9 kg)   BP Readings from Last 3 Encounters:  12/05/21 (!) 168/94  11/27/21 138/70  11/23/21 110/72         Past Medical History:  Diagnosis Date   Abdominal pain, epigastric 12/20/2006   ALLERGIC RHINITIS 12/25/2006   Allergy    ANXIETY 10/16/2006   no per pt   Arthritis    right shoulder   BACK PAIN 06/15/2008   Blood transfusion without reported diagnosis    BRADYCARDIA 05/04/2008   C. difficile diarrhea    Carpal tunnel syndrome 10/16/2006   Cellulitis and abscess of other specified site 06/15/2008   CHEST PAIN 09/08/2009   Chronic low back pain    CIRRHOSIS 10/16/2006   CKD (chronic kidney disease)    2008- had Ecoli and caused renal failure, no problems since then   CKD (chronic kidney disease) stage 3, GFR 30-59 ml/min ([place]) 10/16/2006   Qualifier: Diagnosis of  By: Jenny Reichmann MD, [place] 46/65/9935   Complication of anesthesia    awake during 2 surgeries   CONTUSION, LOWER LEG 09/15/2008   DEPRESSION 10/16/2006   no per pt   Diabetes mellitus without complication ([place])    Esophageal reflux 12/25/2006   HYPERLIPIDEMIA 12/25/2006   Hyperlipidemia    HYPERTENSION 08/15/2006    Hypertension    HYPOTHYROIDISM 08/15/2006   Kidney failure, acute ([place]) 2009   as a result of severe E Coli infection   Left-sided carotid artery disease ([place])    MIGRAINE HEADACHE 10/16/2006   Obesity 10/16/2006   OVARIAN CYST 12/20/2006   Psoriasis 01/03/2011   SHINGLES, HX OF 12/20/2006   Stroke ([place])    3 strokes 2008-no deficits, only on ASA   TRANSIENT ISCHEMIC ATTACKS, HX OF 12/20/2006   VERTIGO 09/15/2007   VITAMIN B12 DEFICIENCY 01/09/2007   Past Surgical History:  Procedure Laterality Date   ABDOMINAL HYSTERECTOMY  02/2006   ANTERIOR CERVICAL DECOMP/DISCECTOMY FUSION  2005   BIOPSY  11/27/2021   Procedure: BIOPSY;  Surgeon: Yetta Flock, MD;  Location: [facility];  Service: Gastroenterology;;   CESAREAN SECTION     CHOLECYSTECTOMY     COLONOSCOPY     COLONOSCOPY WITH PROPOFOL N/A 11/27/2021   Procedure: COLONOSCOPY WITH PROPOFOL;  Surgeon: Yetta Flock, MD;  Location: [facility];  Service: Gastroenterology;  Laterality: N/A;   ENDARTERECTOMY Left 08/25/2014   Procedure: LEFT CAROTID ENDARTERECTOMY WITH HEMASHIELD PATCH ANGIOPLASTY;  Surgeon: Elam Dutch, MD;  Location: [facility];  Service: Vascular;  Laterality: Left;  ESOPHAGOGASTRODUODENOSCOPY (EGD) WITH PROPOFOL N/A 11/27/2021   Procedure: ESOPHAGOGASTRODUODENOSCOPY (EGD) WITH PROPOFOL;  Surgeon: Yetta Flock, MD;  Location: [facility];  Service: Gastroenterology;  Laterality: N/A;   NECK SURGERY  2005   ROTATOR CUFF REPAIR Right 02/2014   Dr Veverly Fells    reports that she has quit smoking. She has never used smokeless tobacco. She reports that she does not drink alcohol and does not use drugs. family history includes Diabetes in her father, sister, and another family member; Heart disease in her brother, brother, father, and mother; Hypertension in her brother, brother and another family member; Lupus in her mother; Raynaud syndrome in her mother; Stroke in her father. Allergies  Allergen Reactions    Macrodantin [Nitrofurantoin Macrocrystal] Shortness Of Breath, Diarrhea and Other (See Comments)    GI upset   Adipex-P [Phentermine] Other (See Comments)    "All the side effects listed on the paper"   Asa [Aspirin] Other (See Comments)    Told to avoid due to kidney function   Bactrim [Sulfamethoxazole-Trimethoprim] Nausea And Vomiting   Cipro [Ciprofloxacin Hcl] Swelling   Levaquin [Levofloxacin In D5w] Other (See Comments)    GI upset   Lipitor [Atorvastatin] Other (See Comments)    Myalgia    Lyrica [Pregabalin] Swelling and Other (See Comments)    Headaches Confusion   Nsaids Other (See Comments)    Told to avoid due to kidney function   Vibramycin [Doxycycline] Nausea Only   Zinacef [Cefuroxime] Swelling   Neurontin [Gabapentin] Rash   Current Outpatient Medications on File Prior to Visit  Medication Sig Dispense Refill   acetaminophen (TYLENOL) 500 MG tablet Take 1,000 mg by mouth every 8 (eight) hours as needed (pain).     amLODipine (NORVASC) 10 MG tablet 1 tab by mouth once daily (Patient taking differently: Take 10 mg by mouth daily.) 90 tablet 3   Ascorbic Acid (VITAMIN C) 1000 MG tablet Take 1,000 mg by mouth daily.     aspirin EC 81 MG tablet Take 81 mg by mouth daily.     Cholecalciferol (VITAMIN D3) 1000 units CAPS Take 2 capsules (2,000 Units total) by mouth daily. 60 capsule 0   clobetasol (TEMOVATE) 0.05 % external solution Apply 1 application topically daily as needed (scalp psoriasis).   0   estradiol (ESTRACE) 0.1 MG/GM vaginal cream Place 1 Applicatorful vaginally See admin instructions. Apply to vagina twice weekly on Monday, Thursday.     hydrALAZINE (APRESOLINE) 100 MG tablet Take 1 tablet (100 mg total) by mouth 3 (three) times daily. 90 tablet 11   levocetirizine (XYZAL) 5 MG tablet Take 5 mg by mouth every evening.     levothyroxine (SYNTHROID) 75 MCG tablet Take 1 tablet (75 mcg total) by mouth daily. 90 tablet 3   lovastatin (MEVACOR) 40 MG tablet  Take 2 tablets (80 mg total) by mouth at bedtime. 180 tablet 3   pantoprazole (PROTONIX) 40 MG tablet Take 1 tablet (40 mg total) by mouth 2 (two) times daily before a meal. (Patient taking differently: Take 40 mg by mouth 2 (two) times daily.) 180 tablet 3   triamcinolone cream (KENALOG) 0.1 % Apply 1 application  topically daily as needed (psoriasis).     TURMERIC CURCUMIN PO Take 1 tablet by mouth daily.     No current facility-administered medications on file prior to visit.        ROS:  All others reviewed and negative.  Objective        PE:  BP (!) 168/94 (BP Location: Left Arm, Patient Position: Sitting, Cuff Size: Large)   Pulse 72   Temp 97.9 F (36.6 C) (Oral)   Ht 5' 2"  (1.575 m)   Wt 186 lb (84.4 kg)   SpO2 96%   BMI 34.02 kg/m                 Constitutional: Pt appears in NAD               HENT: Head: NCAT.                Right Ear: External ear normal.                 Left Ear: External ear normal.                Eyes: . Pupils are equal, round, and reactive to light. Conjunctivae and EOM are normal               Nose: without d/c or deformity               Neck: Neck supple. Gross normal ROM               Cardiovascular: Normal rate and regular rhythm.                 Pulmonary/Chest: Effort normal and breath sounds without rales or wheezing.                Abd:  Soft, NT, ND, + BS, no organomegaly               Neurological: Pt is alert. At baseline orientation, motor grossly intact               Skin: Skin is warm. No rashes, no other new lesions, LE edema - none               Psychiatric: Pt behavior is normal without agitation   Micro: none  Cardiac tracings I have personally interpreted today:  none  Pertinent Radiological findings (summarize): none   Lab Results  Component Value Date   WBC 8.4 11/27/2021   HGB 10.1 (L) 11/27/2021   HCT 31.1 (L) 11/27/2021   PLT 165 11/27/2021   GLUCOSE 137 (H) 12/05/2021   CHOL 173 10/06/2021   TRIG 247.0 (H)  10/06/2021   HDL 40.70 10/06/2021   LDLDIRECT 107.0 10/06/2021   LDLCALC 113 (H) 05/16/2021   ALT 13 11/24/2021   AST 10 (L) 11/24/2021   NA 139 12/05/2021   K 3.7 12/05/2021   CL 104 12/05/2021   CREATININE 1.70 (H) 12/05/2021   BUN 16 12/05/2021   CO2 26 12/05/2021   TSH 0.25 (L) 11/23/2021   INR 0.99 08/17/2014   HGBA1C 6.3 10/06/2021   MICROALBUR <0.7 05/16/2021   Assessment/Plan:  Heather Dawson is a 66 y.o. White or Caucasian [1] female with  has a past medical history of Abdominal pain, epigastric (12/20/2006), ALLERGIC RHINITIS (12/25/2006), Allergy, ANXIETY (10/16/2006), Arthritis, BACK PAIN (06/15/2008), Blood transfusion without reported diagnosis, BRADYCARDIA (05/04/2008), C. difficile diarrhea, Carpal tunnel syndrome (10/16/2006), Cellulitis and abscess of other specified site (06/15/2008), CHEST PAIN (09/08/2009), Chronic low back pain, CIRRHOSIS (10/16/2006), CKD (chronic kidney disease), CKD (chronic kidney disease) stage 3, GFR 30-59 ml/min (H[facility]) (10/16/2006), COMMON MIGRAINE (84/69/6295), Complication of anesthesia, CONTUSION, LOWER LEG (09/15/2008), DEPRESSION (10/16/2006), Diabetes mellitus without complication ([place]), Esophageal reflux (12/25/2006), HYPERLIPIDEMIA (12/25/2006), Hyperlipidemia, HYPERTENSION (08/15/2006), Hypertension, HYPOTHYROIDISM (08/15/2006), Kidney failure,  acute ([place]) (2009), Left-sided carotid artery disease ([place]), MIGRAINE HEADACHE (10/16/2006), Obesity (10/16/2006), OVARIAN CYST (12/20/2006), Psoriasis (01/03/2011), SHINGLES, HX OF (12/20/2006), Stroke ([place]), TRANSIENT ISCHEMIC ATTACKS, HX OF (12/20/2006), VERTIGO (09/15/2007), and VITAMIN B12 DEFICIENCY (01/09/2007).  Essential hypertension BP Readings from Last 3 Encounters:  12/05/21 (!) 168/94  11/27/21 138/70  11/23/21 110/72   Uncontrolled after taken off ACE and diuretic last hospn, pt to continue medical treatment norvasc 10 mg , hydralzine 100 mg tid, and add toprol xl 50 mg qd for better  control   Diabetes mellitus type 2 in obese [facility]) Lab Results  Component Value Date   HGBA1C 6.3 10/06/2021   Stable, pt to continue current medical treatment  - diet wt control, excercise   CKD (chronic kidney disease) stage 4, GFR 15-29 ml/min (HCC) Lab Results  Component Value Date   CREATININE 1.70 (H) 12/05/2021   Stable overall today now back to baseline,, cont to avoid nephrotoxins, f/u renal as planned  Bilateral lower extremity edema Resolved, none today,  to f/u any worsening symptoms or concerns  GERD (gastroesophageal reflux disease) Uncontrolled symptoms per pt; s/p recent EGd without gastritis or esophagitis, for decreased PPI to once daily, also ad pepcid 20 mg bid  Followup: No follow-ups on file.  [name], MD 12/06/2021 8:56 PM [facility]

## 2021-12-06 ENCOUNTER — Encounter: Payer: Self-pay | Admitting: Internal Medicine

## 2021-12-06 NOTE — Assessment & Plan Note (Signed)
Uncontrolled symptoms per pt; s/p recent EGd without gastritis or esophagitis, for decreased PPI to once daily, also ad pepcid 20 mg bid

## 2021-12-06 NOTE — Assessment & Plan Note (Signed)
Lab Results  Component Value Date   HGBA1C 6.3 10/06/2021   Stable, pt to continue current medical treatment  - diet wt control, excercise

## 2021-12-06 NOTE — Assessment & Plan Note (Signed)
Lab Results  Component Value Date   CREATININE 1.70 (H) 12/05/2021   Stable overall today now back to baseline,, cont to avoid nephrotoxins, f/u renal as planned

## 2021-12-06 NOTE — Assessment & Plan Note (Signed)
Resolved, none today,  to f/u any worsening symptoms or concerns

## 2021-12-06 NOTE — Assessment & Plan Note (Signed)
BP Readings from Last 3 Encounters:  12/05/21 (!) 168/94  11/27/21 138/70  11/23/21 110/72   Uncontrolled after taken off ACE and diuretic last hospn, pt to continue medical treatment norvasc 10 mg , hydralzine 100 mg tid, and add toprol xl 50 mg qd for better control

## 2021-12-07 ENCOUNTER — Encounter: Payer: Self-pay | Admitting: Internal Medicine

## 2021-12-15 ENCOUNTER — Ambulatory Visit (INDEPENDENT_AMBULATORY_CARE_PROVIDER_SITE_OTHER): Payer: PPO | Admitting: Internal Medicine

## 2021-12-15 ENCOUNTER — Other Ambulatory Visit: Payer: Self-pay | Admitting: Internal Medicine

## 2021-12-15 DIAGNOSIS — N184 Chronic kidney disease, stage 4 (severe): Secondary | ICD-10-CM | POA: Diagnosis not present

## 2021-12-15 DIAGNOSIS — I10 Essential (primary) hypertension: Secondary | ICD-10-CM

## 2021-12-15 DIAGNOSIS — R600 Localized edema: Secondary | ICD-10-CM | POA: Diagnosis not present

## 2021-12-15 MED ORDER — METOPROLOL SUCCINATE ER 100 MG PO TB24: 100.0000 mg | ORAL_TABLET | Freq: Every day | ORAL | 3 refills | Status: DC

## 2021-12-15 MED ORDER — CHLORTHALIDONE 25 MG PO TABS: 25.0000 mg | ORAL_TABLET | Freq: Every day | ORAL | 3 refills | Status: DC

## 2021-12-15 NOTE — Telephone Encounter (Signed)
Pt here with husband - see other OV note today

## 2021-12-15 NOTE — Patient Instructions (Signed)
Ok to increase the toprol xl to 100 mg per day  Please wear compression stockings during the daytime for the swelling  Please continue all other medications as before, and refills have been done if requested.  Please have the pharmacy call with any other refills you may need.  Please continue your efforts at being more active, low cholesterol diet, and weight control.  Please keep your appointments with your specialists as you may have planned

## 2021-12-15 NOTE — Progress Notes (Unsigned)
Patient ID: Heather Dawson, female   DOB: February 07, 1956, 66 y.o.   MRN: [id number]        Chief Complaint: follow up HTN, edema, ckd4       HPI:  Heather Dawson is a 66 y.o. female here with husband, but also has multiple BPs over the past wk  - all mildly elevated, HR 60's consistently.  Pt denies chest pain, increased sob or doe, wheezing, orthopnea, PND, palpitations, dizziness or syncope, but has pedal edema bilateral as well.  .   Pt denies polydipsia, polyuria, or new focal neuro s/s.          Wt Readings from Last 3 Encounters:  12/05/21 186 lb (84.4 kg)  11/27/21 184 lb 15.5 oz (83.9 kg)  11/23/21 185 lb (83.9 kg)   BP Readings from Last 3 Encounters:  12/05/21 (!) 168/94  11/27/21 138/70  11/23/21 110/72         Past Medical History:  Diagnosis Date   Abdominal pain, epigastric 12/20/2006   ALLERGIC RHINITIS 12/25/2006   Allergy    ANXIETY 10/16/2006   no per pt   Arthritis    right shoulder   BACK PAIN 06/15/2008   Blood transfusion without reported diagnosis    BRADYCARDIA 05/04/2008   C. difficile diarrhea    Carpal tunnel syndrome 10/16/2006   Cellulitis and abscess of other specified site 06/15/2008   CHEST PAIN 09/08/2009   Chronic low back pain    CIRRHOSIS 10/16/2006   CKD (chronic kidney disease)    2008- had Ecoli and caused renal failure, no problems since then   CKD (chronic kidney disease) stage 3, GFR 30-59 ml/min ([place]) 10/16/2006   Qualifier: Diagnosis of  By: Jenny Reichmann MD, [place] 71/69/6789   Complication of anesthesia    awake during 2 surgeries   CONTUSION, LOWER LEG 09/15/2008   DEPRESSION 10/16/2006   no per pt   Diabetes mellitus without complication ([place])    Esophageal reflux 12/25/2006   HYPERLIPIDEMIA 12/25/2006   Hyperlipidemia    HYPERTENSION 08/15/2006   Hypertension    HYPOTHYROIDISM 08/15/2006   Kidney failure, acute ([place]) 2009   as a result of severe E Coli infection   Left-sided carotid artery disease ([place])    MIGRAINE HEADACHE  10/16/2006   Obesity 10/16/2006   OVARIAN CYST 12/20/2006   Psoriasis 01/03/2011   SHINGLES, HX OF 12/20/2006   Stroke ([place])    3 strokes 2008-no deficits, only on ASA   TRANSIENT ISCHEMIC ATTACKS, HX OF 12/20/2006   VERTIGO 09/15/2007   VITAMIN B12 DEFICIENCY 01/09/2007   Past Surgical History:  Procedure Laterality Date   ABDOMINAL HYSTERECTOMY  02/2006   ANTERIOR CERVICAL DECOMP/DISCECTOMY FUSION  2005   BIOPSY  11/27/2021   Procedure: BIOPSY;  Surgeon: Yetta Flock, MD;  Location: [facility];  Service: Gastroenterology;;   CESAREAN SECTION     CHOLECYSTECTOMY     COLONOSCOPY     COLONOSCOPY WITH PROPOFOL N/A 11/27/2021   Procedure: COLONOSCOPY WITH PROPOFOL;  Surgeon: Yetta Flock, MD;  Location: [facility];  Service: Gastroenterology;  Laterality: N/A;   ENDARTERECTOMY Left 08/25/2014   Procedure: LEFT CAROTID ENDARTERECTOMY WITH HEMASHIELD PATCH ANGIOPLASTY;  Surgeon: Elam Dutch, MD;  Location: [facility];  Service: Vascular;  Laterality: Left;   ESOPHAGOGASTRODUODENOSCOPY (EGD) WITH PROPOFOL N/A 11/27/2021   Procedure: ESOPHAGOGASTRODUODENOSCOPY (EGD) WITH PROPOFOL;  Surgeon: Yetta Flock, MD;  Location: [place];  Service: Gastroenterology;  Laterality: N/A;  NECK SURGERY  2005   ROTATOR CUFF REPAIR Right 02/2014   Dr Veverly Fells    reports that she has quit smoking. She has never used smokeless tobacco. She reports that she does not drink alcohol and does not use drugs. family history includes Diabetes in her father, sister, and another family member; Heart disease in her brother, brother, father, and mother; Hypertension in her brother, brother and another family member; Lupus in her mother; Raynaud syndrome in her mother; Stroke in her father. Allergies  Allergen Reactions   Macrodantin [Nitrofurantoin Macrocrystal] Shortness Of Breath, Diarrhea and Other (See Comments)    GI upset   Adipex-P [Phentermine] Other (See Comments)    "All the side  effects listed on the paper"   Asa [Aspirin] Other (See Comments)    Told to avoid due to kidney function   Bactrim [Sulfamethoxazole-Trimethoprim] Nausea And Vomiting   Cipro [Ciprofloxacin Hcl] Swelling   Levaquin [Levofloxacin In D5w] Other (See Comments)    GI upset   Lipitor [Atorvastatin] Other (See Comments)    Myalgia    Lyrica [Pregabalin] Swelling and Other (See Comments)    Headaches Confusion   Nsaids Other (See Comments)    Told to avoid due to kidney function   Vibramycin [Doxycycline] Nausea Only   Zinacef [Cefuroxime] Swelling   Neurontin [Gabapentin] Rash   Current Outpatient Medications on File Prior to Visit  Medication Sig Dispense Refill   acetaminophen (TYLENOL) 500 MG tablet Take 1,000 mg by mouth every 8 (eight) hours as needed (pain).     amLODipine (NORVASC) 10 MG tablet 1 tab by mouth once daily (Patient taking differently: Take 10 mg by mouth daily.) 90 tablet 3   Ascorbic Acid (VITAMIN C) 1000 MG tablet Take 1,000 mg by mouth daily.     aspirin EC 81 MG tablet Take 81 mg by mouth daily.     chlorthalidone (HYGROTON) 25 MG tablet Take 1 tablet (25 mg total) by mouth daily. 90 tablet 3   Cholecalciferol (VITAMIN D3) 1000 units CAPS Take 2 capsules (2,000 Units total) by mouth daily. 60 capsule 0   clobetasol (TEMOVATE) 0.05 % external solution Apply 1 application topically daily as needed (scalp psoriasis).   0   estradiol (ESTRACE) 0.1 MG/GM vaginal cream Place 1 Applicatorful vaginally See admin instructions. Apply to vagina twice weekly on Monday, Thursday.     famotidine (PEPCID) 20 MG tablet Take 1 tablet (20 mg total) by mouth 2 (two) times daily. 180 tablet 3   hydrALAZINE (APRESOLINE) 100 MG tablet Take 1 tablet (100 mg total) by mouth 3 (three) times daily. 90 tablet 11   levocetirizine (XYZAL) 5 MG tablet Take 5 mg by mouth every evening.     levothyroxine (SYNTHROID) 75 MCG tablet Take 1 tablet (75 mcg total) by mouth daily. 90 tablet 3    lovastatin (MEVACOR) 40 MG tablet Take 2 tablets (80 mg total) by mouth at bedtime. 180 tablet 3   metoprolol succinate (TOPROL-XL) 100 MG 24 hr tablet Take 1 tablet (100 mg total) by mouth daily. Take with or immediately following a meal. 90 tablet 3   pantoprazole (PROTONIX) 40 MG tablet Take 1 tablet (40 mg total) by mouth 2 (two) times daily before a meal. (Patient taking differently: Take 40 mg by mouth 2 (two) times daily.) 180 tablet 3   triamcinolone cream (KENALOG) 0.1 % Apply 1 application  topically daily as needed (psoriasis).     TURMERIC CURCUMIN PO Take 1 tablet by mouth  daily.     No current facility-administered medications on file prior to visit.        ROS:  All others reviewed and negative.  Objective        PE:  There were no vitals taken for this visit.                Constitutional: Pt appears in NAD               HENT: Head: NCAT.                Right Ear: External ear normal.                 Left Ear: External ear normal.                Eyes: . Pupils are equal, round, and reactive to light. Conjunctivae and EOM are normal               Nose: without d/c or deformity               Neck: Neck supple. Gross normal ROM               Cardiovascular: Normal rate and regular rhythm.                 Pulmonary/Chest: Effort normal and breath sounds without rales or wheezing.                Abd:  Soft, NT, ND, + BS, no organomegaly               Neurological: Pt is alert. At baseline orientation, motor grossly intact               Skin: Skin is warm. No rashes, no other new lesions, LE edema - trace to 1+ bilateral               Psychiatric: Pt behavior is normal without agitation   Micro: none  Cardiac tracings I have personally interpreted today:  none  Pertinent Radiological findings (summarize): none   Lab Results  Component Value Date   WBC 8.4 11/27/2021   HGB 10.1 (L) 11/27/2021   HCT 31.1 (L) 11/27/2021   PLT 165 11/27/2021   GLUCOSE 137 (H) 12/05/2021    CHOL 173 10/06/2021   TRIG 247.0 (H) 10/06/2021   HDL 40.70 10/06/2021   LDLDIRECT 107.0 10/06/2021   LDLCALC 113 (H) 05/16/2021   ALT 13 11/24/2021   AST 10 (L) 11/24/2021   NA 139 12/05/2021   K 3.7 12/05/2021   CL 104 12/05/2021   CREATININE 1.70 (H) 12/05/2021   BUN 16 12/05/2021   CO2 26 12/05/2021   TSH 0.25 (L) 11/23/2021   INR 0.99 08/17/2014   HGBA1C 6.3 10/06/2021   MICROALBUR <0.7 05/16/2021   Assessment/Plan:  Heather Dawson is a 66 y.o. White or Caucasian [1] female with  has a past medical history of Abdominal pain, epigastric (12/20/2006), ALLERGIC RHINITIS (12/25/2006), Allergy, ANXIETY (10/16/2006), Arthritis, BACK PAIN (06/15/2008), Blood transfusion without reported diagnosis, BRADYCARDIA (05/04/2008), C. difficile diarrhea, Carpal tunnel syndrome (10/16/2006), Cellulitis and abscess of other specified site (06/15/2008), CHEST PAIN (09/08/2009), Chronic low back pain, CIRRHOSIS (10/16/2006), CKD (chronic kidney disease), CKD (chronic kidney disease) stage 3, GFR 30-59 ml/min ([place]) (10/16/2006), COMMON MIGRAINE (82/95/6213), Complication of anesthesia, CONTUSION, LOWER LEG (09/15/2008), DEPRESSION (10/16/2006), Diabetes mellitus without complication ([place]), Esophageal reflux (12/25/2006), HYPERLIPIDEMIA (12/25/2006), Hyperlipidemia, HYPERTENSION (08/15/2006), Hypertension, HYPOTHYROIDISM (08/15/2006), Kidney  failure, acute ([place]) (2009), Left-sided carotid artery disease ([place]), MIGRAINE HEADACHE (10/16/2006), Obesity (10/16/2006), OVARIAN CYST (12/20/2006), Psoriasis (01/03/2011), SHINGLES, HX OF (12/20/2006), Stroke ([place]), TRANSIENT ISCHEMIC ATTACKS, HX OF (12/20/2006), VERTIGO (09/15/2007), and VITAMIN B12 DEFICIENCY (01/09/2007).  Essential hypertension BP Readings from Last 3 Encounters:  12/05/21 (!) 168/94  11/27/21 138/70  11/23/21 110/72   uncontroled, pt to continue medical treatment norvasc 10 mg qd, chlorthalidone 25 qd , hydralazine 100 tid, but increase the BB to toprol xl 100  mg qd   Bilateral lower extremity edema C/w venous insufficiency , for compressoin stocking daytime  CKD (chronic kidney disease) stage 4, GFR 15-29 ml/min (H[facility]) Lab Results  Component Value Date   CREATININE 1.70 (H) 12/05/2021   Stable overall, cont to avoid nephrotoxins, f/u renal as planned  Followup: Return in 3 months (on 03/17/2022), or if symptoms worsen or fail to improve.  [name], MD 12/16/2021 8:13 PM [facility]

## 2021-12-16 ENCOUNTER — Encounter: Payer: Self-pay | Admitting: Internal Medicine

## 2021-12-16 NOTE — Assessment & Plan Note (Signed)
BP Readings from Last 3 Encounters:  12/05/21 (!) 168/94  11/27/21 138/70  11/23/21 110/72   uncontroled, pt to continue medical treatment norvasc 10 mg qd, chlorthalidone 25 qd , hydralazine 100 tid, but increase the BB to toprol xl 100 mg qd

## 2021-12-16 NOTE — Assessment & Plan Note (Signed)
Lab Results  Component Value Date   CREATININE 1.70 (H) 12/05/2021   Stable overall, cont to avoid nephrotoxins, f/u renal as planned

## 2021-12-16 NOTE — Assessment & Plan Note (Signed)
C/w venous insufficiency , for compressoin stocking daytime

## 2021-12-19 ENCOUNTER — Encounter: Payer: PPO | Admitting: Internal Medicine

## 2021-12-21 ENCOUNTER — Encounter: Payer: Self-pay | Admitting: Internal Medicine

## 2021-12-21 MED ORDER — METOPROLOL SUCCINATE ER 100 MG PO TB24: 150.0000 mg | ORAL_TABLET | Freq: Every day | ORAL | 3 refills | Status: DC

## 2021-12-21 NOTE — Telephone Encounter (Signed)
Patient states the metoprolol is still not working, still running in the 160's and 170's blood pressure. The lower is good

## 2021-12-21 NOTE — Addendum Note (Signed)
Addended by: Biagio Borg on: 12/21/2021 02:59 PM   Modules accepted: Orders

## 2021-12-22 ENCOUNTER — Encounter: Payer: Self-pay | Admitting: Internal Medicine

## 2021-12-22 ENCOUNTER — Ambulatory Visit (INDEPENDENT_AMBULATORY_CARE_PROVIDER_SITE_OTHER): Payer: PPO | Admitting: Internal Medicine

## 2021-12-22 VITALS — BP 124/72 | HR 61 | Ht 62.0 in | Wt 184.0 lb

## 2021-12-22 DIAGNOSIS — K219 Gastro-esophageal reflux disease without esophagitis: Secondary | ICD-10-CM | POA: Diagnosis not present

## 2021-12-22 DIAGNOSIS — R197 Diarrhea, unspecified: Secondary | ICD-10-CM | POA: Diagnosis not present

## 2021-12-22 NOTE — Patient Instructions (Signed)
_______________________________________________________  If you are age 65 or older, your body mass index should be between 23-30. Your Body mass index is 33.65 kg/m. If this is out of the aforementioned range listed, please consider follow up with your Primary Care Provider.  If you are age 20 or younger, your body mass index should be between 19-25. Your Body mass index is 33.65 kg/m. If this is out of the aformentioned range listed, please consider follow up with your Primary Care Provider.   ________________________________________________________  The [place] [facility] providers would like to encourage you to use [facility] to communicate with providers for non-urgent requests or questions.  Due to long hold times on the telephone, sending your provider a message by [facility] may be a faster and more efficient way to get a response.  Please allow 48 business hours for a response.  Please remember that this is for non-urgent requests.  _______________________________________________________  Please follow up as needed

## 2021-12-22 NOTE — Progress Notes (Signed)
HISTORY OF PRESENT ILLNESS:  Heather Dawson is a 66 y.o. female with a history of GERD, adenomatous colon polyps, and C. difficile associated diarrhea.  She presents today for posthospitalization follow-up.  I last saw the patient February 2023 regarding loose stools and nausea.  See that dictation.  She also has a history of ischemic colitis.  Patient reports having had severe diarrhea since June.  She was evaluated in the office by the GI nurse practitioner November 23, 2021.  At that time she was found to have acute kidney injury.  She was subsequently admitted to the hospital November 24, 2021.  She was treated with IV fluids for dehydration.  Lisinopril had been stopped.  GI pathogen panel was negative.  GI was consulted and she underwent both colonoscopy and upper endoscopy on November 27, 2021.  Colonoscopy was unremarkable including intubation of the terminal ileum.  Incidental diverticulosis noted.  Random colon biopsies were unremarkable.  No microscopic colitis.  Upper endoscopy revealed no significant abnormalities.  Duodenal biopsies were unremarkable.  No evidence for sprue or sprue-like change.  At the at the time of hospital discharge on November 27, 2021, her diarrhea had resolved.  This follow-up appointment was arranged.  She is accompanied today by her husband Heather Dawson.  Patient tells me that she has had no problems with diarrhea since her hospital discharge.  She had loose bowels 1 day, as did her husband.  Otherwise, she has 1 formed bowel movement per day.  She tells me that probiotic has helped her problems with bloating and gas.  She is pleased.  She recently saw her PCP.  Reviewed.  Blood work from December 05, 2021 revealed a creatinine 1.7.  Hemoglobin 10.1.  REVIEW OF SYSTEMS:  All non-GI ROS negative unless otherwise stated in the HPI except for sinus and allergy, arthritis, cough, lower extremity swelling, urinary leakage  Past Medical History:  Diagnosis Date   Abdominal pain,  epigastric 12/20/2006   ALLERGIC RHINITIS 12/25/2006   Allergy    ANXIETY 10/16/2006   no per pt   Arthritis    right shoulder   BACK PAIN 06/15/2008   Blood transfusion without reported diagnosis    BRADYCARDIA 05/04/2008   C. difficile diarrhea    Carpal tunnel syndrome 10/16/2006   Cellulitis and abscess of other specified site 06/15/2008   CHEST PAIN 09/08/2009   Chronic low back pain    CIRRHOSIS 10/16/2006   CKD (chronic kidney disease)    2008- had Ecoli and caused renal failure, no problems since then   CKD (chronic kidney disease) stage 3, GFR 30-59 ml/min ([place]) 10/16/2006   Qualifier: Diagnosis of  By: Jenny Reichmann MD, [place] 20/94/7096   Complication of anesthesia    awake during 2 surgeries   CONTUSION, LOWER LEG 09/15/2008   DEPRESSION 10/16/2006   no per pt   Diabetes mellitus without complication ([place])    Esophageal reflux 12/25/2006   HYPERLIPIDEMIA 12/25/2006   Hyperlipidemia    HYPERTENSION 08/15/2006   Hypertension    HYPOTHYROIDISM 08/15/2006   Kidney failure, acute ([place]) 2009   as a result of severe E Coli infection   Left-sided carotid artery disease ([place])    MIGRAINE HEADACHE 10/16/2006   Obesity 10/16/2006   OVARIAN CYST 12/20/2006   Psoriasis 01/03/2011   SHINGLES, HX OF 12/20/2006   Stroke ([place])    3 strokes 2008-no deficits, only on ASA   TRANSIENT ISCHEMIC ATTACKS, HX OF 12/20/2006  VERTIGO 09/15/2007   VITAMIN B12 DEFICIENCY 01/09/2007    Past Surgical History:  Procedure Laterality Date   ABDOMINAL HYSTERECTOMY  02/2006   ANTERIOR CERVICAL DECOMP/DISCECTOMY FUSION  2005   BIOPSY  11/27/2021   Procedure: BIOPSY;  Surgeon: Yetta Flock, MD;  Location: [facility];  Service: Gastroenterology;;   CESAREAN SECTION     CHOLECYSTECTOMY     COLONOSCOPY     COLONOSCOPY WITH PROPOFOL N/A 11/27/2021   Procedure: COLONOSCOPY WITH PROPOFOL;  Surgeon: Yetta Flock, MD;  Location: [place];  Service: Gastroenterology;  Laterality:  N/A;   ENDARTERECTOMY Left 08/25/2014   Procedure: LEFT CAROTID ENDARTERECTOMY WITH HEMASHIELD PATCH ANGIOPLASTY;  Surgeon: Elam Dutch, MD;  Location[facility];  Service: Vascular;  Laterality: Left;   ESOPHAGOGASTRODUODENOSCOPY (EGD) WITH PROPOFOL N/A 11/27/2021   Procedure: ESOPHAGOGASTRODUODENOSCOPY (EGD) WITH PROPOFOL;  Surgeon: Yetta Flock, MD;  Location: [place];  Service: Gastroenterology;  Laterality: N/A;   NECK SURGERY  2005   ROTATOR CUFF REPAIR Right 02/2014   Dr Veverly Fells    Social History Michae Kava Hutchinson  reports that she has quit smoking. She has never used smokeless tobacco. She reports that she does not drink alcohol and does not use drugs.  family history includes Diabetes in her father, sister, and another family member; Heart disease in her brother, brother, father, and mother; Hypertension in her brother, brother and another family member; Lupus in her mother; Raynaud syndrome in her mother; Stroke in her father.  Allergies  Allergen Reactions   Macrodantin [Nitrofurantoin Macrocrystal] Shortness Of Breath, Diarrhea and Other (See Comments)    GI upset   Adipex-P [Phentermine] Other (See Comments)    "All the side effects listed on the paper"   Asa [Aspirin] Other (See Comments)    Told to avoid due to kidney function   Bactrim [Sulfamethoxazole-Trimethoprim] Nausea And Vomiting   Cipro [Ciprofloxacin Hcl] Swelling   Levaquin [Levofloxacin In D5w] Other (See Comments)    GI upset   Lipitor [Atorvastatin] Other (See Comments)    Myalgia    Lyrica [Pregabalin] Swelling and Other (See Comments)    Headaches Confusion   Nsaids Other (See Comments)    Told to avoid due to kidney function   Vibramycin [Doxycycline] Nausea Only   Zinacef [Cefuroxime] Swelling   Neurontin [Gabapentin] Rash       PHYSICAL EXAMINATION: Vital signs: BP 124/72   Pulse 61   Ht _0  (1.575 m)   Wt 184 lb (83.5 kg)   BMI 33.65 kg/m   Constitutional: generally  well-appearing, no acute distress Psychiatric: alert and oriented x3, cooperative Eyes: extraocular movements intact, anicteric, conjunctiva pink Mouth: oral pharynx moist, no lesions Neck: supple no lymphadenopathy Cardiovascular: heart regular rate and rhythm, no murmur Lungs: clear to auscultation bilaterally Abdomen: soft, obese, nontender, nondistended, no obvious ascites, no peritoneal signs, normal bowel sounds, no organomegaly Rectal: Omitted Extremities: no clubbing, cyanosis, or lower extremity edema bilaterally Skin: no lesions on visible extremities Neuro: No focal deficits.  Cranial nerves intact  ASSESSMENT:  1.  Diarrheal illness.  Resolved. 2.  Prior history of C. difficile associated diarrhea 3.  History of ischemic colitis 4.  History of adenomatous colon polyps.  Recent colonoscopy as described. 5.  GERD.  Asymptomatic on pantoprazole  PLAN:  1.  Reviewed her work-up.  Questions answered. 2.  Reflux precautions 3.  Continue pantoprazole 4.  Routine office follow-up 1 year 5.  Change surveillance colonoscopy date to 2033 A total  time of 30 minutes was spent preparing to see the patient, reviewing the myriad of data, obtaining comprehensive interval history, performing medically appropriate physical examination, counseling the patient and her husband regarding the above listed issues, and documenting clinical information in the medical record

## 2022-01-02 ENCOUNTER — Emergency Department (HOSPITAL_COMMUNITY)
Admission: EM | Admit: 2022-01-02 | Discharge: 2022-01-02 | Disposition: A | Discharge status: Home / Self Care | Payer: PPO | Attending: Emergency Medicine | Admitting: Emergency Medicine

## 2022-01-02 ENCOUNTER — Other Ambulatory Visit: Payer: Self-pay

## 2022-01-02 ENCOUNTER — Encounter: Payer: Self-pay | Admitting: Family Medicine

## 2022-01-02 ENCOUNTER — Emergency Department (HOSPITAL_COMMUNITY): Payer: PPO

## 2022-01-02 ENCOUNTER — Encounter (HOSPITAL_COMMUNITY): Payer: Self-pay

## 2022-01-02 ENCOUNTER — Ambulatory Visit (INDEPENDENT_AMBULATORY_CARE_PROVIDER_SITE_OTHER): Payer: PPO | Admitting: Family Medicine

## 2022-01-02 VITALS — BP 170/98 | HR 55 | Temp 97.6°F | Ht 62.0 in | Wt 185.0 lb

## 2022-01-02 DIAGNOSIS — Z7982 Long term (current) use of aspirin: Secondary | ICD-10-CM | POA: Diagnosis not present

## 2022-01-02 DIAGNOSIS — N390 Urinary tract infection, site not specified: Secondary | ICD-10-CM | POA: Diagnosis not present

## 2022-01-02 DIAGNOSIS — N184 Chronic kidney disease, stage 4 (severe): Secondary | ICD-10-CM | POA: Diagnosis not present

## 2022-01-02 DIAGNOSIS — R339 Retention of urine, unspecified: Secondary | ICD-10-CM

## 2022-01-02 DIAGNOSIS — I10 Essential (primary) hypertension: Secondary | ICD-10-CM

## 2022-01-02 DIAGNOSIS — Z79899 Other long term (current) drug therapy: Secondary | ICD-10-CM | POA: Diagnosis not present

## 2022-01-02 DIAGNOSIS — N189 Chronic kidney disease, unspecified: Secondary | ICD-10-CM | POA: Diagnosis not present

## 2022-01-02 DIAGNOSIS — I129 Hypertensive chronic kidney disease with stage 1 through stage 4 chronic kidney disease, or unspecified chronic kidney disease: Secondary | ICD-10-CM | POA: Diagnosis not present

## 2022-01-02 DIAGNOSIS — Z905 Acquired absence of kidney: Secondary | ICD-10-CM

## 2022-01-02 DIAGNOSIS — R338 Other retention of urine: Secondary | ICD-10-CM | POA: Diagnosis not present

## 2022-01-02 DIAGNOSIS — N281 Cyst of kidney, acquired: Secondary | ICD-10-CM | POA: Diagnosis not present

## 2022-01-02 DIAGNOSIS — N261 Atrophy of kidney (terminal): Secondary | ICD-10-CM | POA: Diagnosis not present

## 2022-01-02 LAB — CBC WITH DIFFERENTIAL/PLATELET
Abs Immature Granulocytes: 0.03 10*3/uL (ref 0.00–0.07)
Basophils Absolute: 0 10*3/uL (ref 0.0–0.1)
Basophils Relative: 0 %
Eosinophils Absolute: 0.3 10*3/uL (ref 0.0–0.5)
Eosinophils Relative: 3 %
HCT: 39.4 % (ref 36.0–46.0)
Hemoglobin: 13.2 g/dL (ref 12.0–15.0)
Immature Granulocytes: 0 %
Lymphocytes Relative: 18 %
Lymphs Abs: 1.7 10*3/uL (ref 0.7–4.0)
MCH: 31.6 pg (ref 26.0–34.0)
MCHC: 33.5 g/dL (ref 30.0–36.0)
MCV: 94.3 fL (ref 80.0–100.0)
Monocytes Absolute: 0.7 10*3/uL (ref 0.1–1.0)
Monocytes Relative: 7 %
Neutro Abs: 6.6 10*3/uL (ref 1.7–7.7)
Neutrophils Relative %: 72 %
Platelets: 218 10*3/uL (ref 150–400)
RBC: 4.18 MIL/uL (ref 3.87–5.11)
RDW: 13.1 % (ref 11.5–15.5)
WBC: 9.3 10*3/uL (ref 4.0–10.5)
nRBC: 0 % (ref 0.0–0.2)

## 2022-01-02 LAB — COMPREHENSIVE METABOLIC PANEL
ALT: 26 U/L (ref 0–44)
AST: 24 U/L (ref 15–41)
Albumin: 4.1 g/dL (ref 3.5–5.0)
Alkaline Phosphatase: 56 U/L (ref 38–126)
Anion gap: 8 (ref 5–15)
BUN: 20 mg/dL (ref 8–23)
CO2: 25 mmol/L (ref 22–32)
Calcium: 9.4 mg/dL (ref 8.9–10.3)
Chloride: 110 mmol/L (ref 98–111)
Creatinine, Ser: 1.47 mg/dL — ABNORMAL HIGH (ref 0.44–1.00)
GFR, Estimated: 39 mL/min — ABNORMAL LOW (ref >60)
Glucose, Bld: 105 mg/dL — ABNORMAL HIGH (ref 70–99)
Potassium: 3.8 mmol/L (ref 3.5–5.1)
Sodium: 143 mmol/L (ref 135–145)
Total Bilirubin: 0.8 mg/dL (ref 0.3–1.2)
Total Protein: 6.9 g/dL (ref 6.5–8.1)

## 2022-01-02 LAB — URINALYSIS, ROUTINE W REFLEX MICROSCOPIC
Bilirubin Urine: NEGATIVE
Glucose, UA: NEGATIVE mg/dL
Hgb urine dipstick: NEGATIVE
Ketones, ur: NEGATIVE mg/dL
Nitrite: NEGATIVE
Protein, ur: NEGATIVE mg/dL
Specific Gravity, Urine: 1.01 (ref 1.005–1.030)
pH: 7 (ref 5.0–8.0)

## 2022-01-02 MED ORDER — FOSFOMYCIN TROMETHAMINE 3 G PO PACK
3.0000 g | PACK | Freq: Once | ORAL | Status: AC
  Administered 2022-01-02: 3 g via ORAL
  Filled 2022-01-02 (×2): qty 3

## 2022-01-02 NOTE — ED Provider Notes (Signed)
[place] DEPT Provider Note   CSN: [id number] Arrival date & time: 01/02/22  1243     History {Add pertinent medical, surgical, social history, OB history to HPI:1} Chief Complaint  Patient presents with   Urinary Retention    Heather Dawson is a 66 y.o. female.  HPI     Home Medications Prior to Admission medications   Medication Sig Start Date End Date Taking? Authorizing Provider  acetaminophen (TYLENOL) 500 MG tablet Take 1,000 mg by mouth every 8 (eight) hours as needed (pain).    [provider]  amLODipine (NORVASC) 10 MG tablet 1 tab by mouth once daily Patient taking differently: Take 10 mg by mouth daily. 05/16/21   Biagio Borg, MD  Ascorbic Acid (VITAMIN C) 1000 MG tablet Take 1,000 mg by mouth daily.    [provider]  aspirin EC 81 MG tablet Take 81 mg by mouth daily.    [provider]  Cholecalciferol (VITAMIN D3) 1000 units CAPS Take 2 capsules (2,000 Units total) by mouth daily. 04/27/15   Biagio Borg, MD  clobetasol (TEMOVATE) 0.05 % external solution Apply 1 application topically daily as needed (scalp psoriasis).  12/07/15   [provider]  estradiol (ESTRACE) 0.1 MG/GM vaginal cream Place 1 Applicatorful vaginally See admin instructions. Apply to vagina twice weekly on Monday, Thursday. 03/15/21   [provider]  famotidine (PEPCID) 20 MG tablet Take 1 tablet (20 mg total) by mouth 2 (two) times daily. 12/05/21   Biagio Borg, MD  hydrALAZINE (APRESOLINE) 100 MG tablet Take 1 tablet (100 mg total) by mouth 3 (three) times daily. 05/16/21   Biagio Borg, MD  levocetirizine (XYZAL) 5 MG tablet Take 5 mg by mouth every evening. 09/12/17   [provider]  levothyroxine (SYNTHROID) 75 MCG tablet Take 1 tablet (75 mcg total) by mouth daily. 05/16/21   Biagio Borg, MD  lovastatin (MEVACOR) 40 MG tablet Take 2 tablets (80 mg total) by mouth at bedtime. 05/16/21   Biagio Borg,  MD  metoprolol succinate (TOPROL-XL) 100 MG 24 hr tablet Take 1.5 tablets (150 mg total) by mouth daily. Take with or immediately following a meal. 12/21/21   Biagio Borg, MD  pantoprazole (PROTONIX) 40 MG tablet Take 1 tablet (40 mg total) by mouth 2 (two) times daily before a meal. Patient taking differently: Take 40 mg by mouth 2 (two) times daily. 10/12/21   Biagio Borg, MD  triamcinolone cream (KENALOG) 0.1 % Apply 1 application  topically daily as needed (psoriasis). 06/03/19   [provider]  TURMERIC CURCUMIN PO Take 1 tablet by mouth daily.    [provider]      Allergies    Macrodantin [nitrofurantoin macrocrystal], Adipex-p [phentermine], Asa [aspirin], Bactrim [sulfamethoxazole-trimethoprim], Cipro [ciprofloxacin hcl], Levaquin [levofloxacin in d5w], Lipitor [atorvastatin], Lyrica [pregabalin], Nsaids, Vibramycin [doxycycline], Zinacef [cefuroxime], and Neurontin [gabapentin]    Review of Systems   Review of Systems  Physical Exam Updated Vital Signs BP (!) 196/86   Pulse 61   Temp 97.9 F (36.6 C) (Oral)   Resp 18   Ht 5' 2"  (1.575 m)   Wt 83.9 kg   SpO2 97%   BMI 33.84 kg/m  Physical Exam  ED Results / Procedures / Treatments   Labs (all labs ordered are listed, but only abnormal results are displayed) Labs Reviewed  COMPREHENSIVE METABOLIC PANEL - Abnormal; Notable for the following components:  Result Value   Glucose, Bld 105 (*)    Creatinine, Ser 1.47 (*)    GFR, Estimated 39 (*)    All other components within normal limits  CBC WITH DIFFERENTIAL/PLATELET  URINALYSIS, ROUTINE W REFLEX MICROSCOPIC    EKG None  Radiology No results found.  Procedures Procedures  {Document cardiac monitor, telemetry assessment procedure when appropriate:1}  Medications Ordered in ED Medications - No data to display  ED Course/ Medical Decision Making/ A&P                           Medical Decision Making Amount and/or Complexity of  Data Reviewed Radiology: ordered.  Risk Prescription drug management.   ***  Patient refused offer of ABX for her UA results citing a hx of bad diarrhea for weeks. Explained risks of possible development of UTI and pyelonephritis.  {Document critical care time when appropriate:1} {Document review of labs and clinical decision tools ie heart score, Chads2Vasc2 etc:1}  {Document your independent review of radiology images, and any outside records:1} {Document your discussion with family members, caretakers, and with consultants:1} {Document social determinants of health affecting pt's care:1} {Document your decision making why or why not admission, treatments were needed:1} Final Clinical Impression(s) / ED Diagnoses Final diagnoses:  None    Rx / DC Orders ED Discharge Orders     None

## 2022-01-02 NOTE — ED Notes (Signed)
Ultrasound at bedside

## 2022-01-02 NOTE — Progress Notes (Signed)
Subjective:     Patient ID: Heather Dawson, female    DOB: 13-Nov-1955, 66 y.o.   MRN: [id number]  Chief Complaint  Patient presents with   Hypertension    Dr. Jenny Reichmann recently moved up her metoprolol  to 150 mg and still running in the 160s-170s. Reports starting Friday she started taking 200 mg and it has not lowered BP.   Urinary Retention    Has not been able to void this morning, when she goes it only has a few drops. Recently dx stage 4 CKD    HPI Patient is in today for urinary retention. States "I can't pee". Tried to urinate this morning and could not. Only a few drops trickled on the toilet paper. No blood.  Abdominal pain, pressure.   States she has stage 4 or 3 renal function.   Under the care of nephrologist at [facility].   BP elevated since mid October.   She was taken off of lisinopril, it was causing diarrhea.   She is under the care of GI   Denies fever, chills, dizziness, chest pain, palpitations, shortness of breath,  N/V/D, LE edema.      Health Maintenance Due  Topic Date Due   DEXA SCAN  Never done    Past Medical History:  Diagnosis Date   Abdominal pain, epigastric 12/20/2006   ALLERGIC RHINITIS 12/25/2006   Allergy    ANXIETY 10/16/2006   no per pt   Arthritis    right shoulder   BACK PAIN 06/15/2008   Blood transfusion without reported diagnosis    BRADYCARDIA 05/04/2008   C. difficile diarrhea    Carpal tunnel syndrome 10/16/2006   Cellulitis and abscess of other specified site 06/15/2008   CHEST PAIN 09/08/2009   Chronic low back pain    CIRRHOSIS 10/16/2006   CKD (chronic kidney disease)    2008- had Ecoli and caused renal failure, no problems since then   CKD (chronic kidney disease) stage 3, GFR 30-59 ml/min ([place]) 10/16/2006   Qualifier: Diagnosis of  By: Jenny Reichmann MD, [place] 21/22/4825   Complication of anesthesia    awake during 2 surgeries   CONTUSION, LOWER LEG 09/15/2008   DEPRESSION 10/16/2006   no per pt    Diabetes mellitus without complication ([place])    Esophageal reflux 12/25/2006   HYPERLIPIDEMIA 12/25/2006   Hyperlipidemia    HYPERTENSION 08/15/2006   Hypertension    HYPOTHYROIDISM 08/15/2006   Kidney failure, acute ([place]) 2009   as a result of severe E Coli infection   Left-sided carotid artery disease ([place])    MIGRAINE HEADACHE 10/16/2006   Obesity 10/16/2006   OVARIAN CYST 12/20/2006   Psoriasis 01/03/2011   SHINGLES, HX OF 12/20/2006   Stroke ([place])    3 strokes 2008-no deficits, only on ASA   TRANSIENT ISCHEMIC ATTACKS, HX OF 12/20/2006   VERTIGO 09/15/2007   VITAMIN B12 DEFICIENCY 01/09/2007    Past Surgical History:  Procedure Laterality Date   ABDOMINAL HYSTERECTOMY  02/2006   ANTERIOR CERVICAL DECOMP/DISCECTOMY FUSION  2005   BIOPSY  11/27/2021   Procedure: BIOPSY;  Surgeon: Yetta Flock, MD;  Location: [place] [facility];  Service: Gastroenterology;;   CESAREAN SECTION     CHOLECYSTECTOMY     COLONOSCOPY     COLONOSCOPY WITH PROPOFOL N/A 11/27/2021   Procedure: COLONOSCOPY WITH PROPOFOL;  Surgeon: Yetta Flock, MD;  Location: [place];  Service: Gastroenterology;  Laterality: N/A;   ENDARTERECTOMY  Left 08/25/2014   Procedure: LEFT CAROTID ENDARTERECTOMY WITH HEMASHIELD PATCH ANGIOPLASTY;  Surgeon: Elam Dutch, MD;  Location: [facility];  Service: Vascular;  Laterality: Left;   ESOPHAGOGASTRODUODENOSCOPY (EGD) WITH PROPOFOL N/A 11/27/2021   Procedure: ESOPHAGOGASTRODUODENOSCOPY (EGD) WITH PROPOFOL;  Surgeon: Yetta Flock, MD;  Location: [place];  Service: Gastroenterology;  Laterality: N/A;   NECK SURGERY  2005   ROTATOR CUFF REPAIR Right 02/2014   Dr Veverly Fells    Family History  Problem Relation Age of Onset   Stroke Father    Heart disease Father    Diabetes Father    Lupus Mother    Raynaud syndrome Mother    Heart disease Mother    Diabetes Sister    Heart disease Brother    Hypertension Brother    Heart disease Brother    Hypertension  Brother    Diabetes Other    Hypertension Other    Colon cancer Neg Hx    Esophageal cancer Neg Hx    Rectal cancer Neg Hx    Stomach cancer Neg Hx     Social History   Socioeconomic History   Marital status: Married    Spouse name: [name]   Number of children: 2   Years of education: Not on file   Highest education level: Not on file  Occupational History   Occupation: retired  Tobacco Use   Smoking status: Former   Smokeless tobacco: Never   Tobacco comments:    quit smoking over 30 years ago  Vaping Use   Vaping Use: Never used  Substance and Sexual Activity   Alcohol use: No   Drug use: No   Sexual activity: Not on file  Other Topics Concern   Not on file  Social History Narrative   Lives with husband   Right Handed   Drinks 2-4 cups coffee   Social Determinants of Health   Financial Resource Strain: Low Risk  (03/07/2021)   Overall Financial Resource Strain (CARDIA)    Difficulty of Paying Living Expenses: Not hard at all  Food Insecurity: No Food Insecurity (03/07/2021)   Hunger Vital Sign    Worried About Running Out of Food in the Last Year: Never true    Ran Out of Food in the Last Year: Never true  Transportation Needs: No Transportation Needs (03/07/2021)   PRAPARE - Hydrologist (Medical): No    Lack of Transportation (Non-Medical): No  Physical Activity: Sufficiently Active (03/07/2021)   Exercise Vital Sign    Days of Exercise per Week: 5 days    Minutes of Exercise per Session: 30 min  Stress: No Stress Concern Present (03/07/2021)   [place]    Feeling of Stress : Not at all  Social Connections: [place] (03/07/2021)   Social Connection and Isolation Panel [NHANES]    Frequency of Communication with Friends and Family: More than three times a week    Frequency of Social Gatherings with Friends and Family: More than three times a week     Attends Religious Services: More than 4 times per year    Active Member of [employer] or Organizations: Yes    Attends Archivist Meetings: More than 4 times per year    Marital Status: Married  Human resources officer Violence: Not At Risk (03/07/2021)   Humiliation, Afraid, Rape, and Kick questionnaire    Fear of Current or Ex-Partner: No    Emotionally  Abused: No    Physically Abused: No    Sexually Abused: No    Outpatient Medications Prior to Visit  Medication Sig Dispense Refill   acetaminophen (TYLENOL) 500 MG tablet Take 1,000 mg by mouth every 8 (eight) hours as needed (pain).     amLODipine (NORVASC) 10 MG tablet 1 tab by mouth once daily (Patient taking differently: Take 10 mg by mouth daily.) 90 tablet 3   Ascorbic Acid (VITAMIN C) 1000 MG tablet Take 1,000 mg by mouth daily.     aspirin EC 81 MG tablet Take 81 mg by mouth daily.     Cholecalciferol (VITAMIN D3) 1000 units CAPS Take 2 capsules (2,000 Units total) by mouth daily. 60 capsule 0   clobetasol (TEMOVATE) 0.05 % external solution Apply 1 application topically daily as needed (scalp psoriasis).   0   estradiol (ESTRACE) 0.1 MG/GM vaginal cream Place 1 Applicatorful vaginally See admin instructions. Apply to vagina twice weekly on Monday, Thursday.     famotidine (PEPCID) 20 MG tablet Take 1 tablet (20 mg total) by mouth 2 (two) times daily. 180 tablet 3   hydrALAZINE (APRESOLINE) 100 MG tablet Take 1 tablet (100 mg total) by mouth 3 (three) times daily. 90 tablet 11   levocetirizine (XYZAL) 5 MG tablet Take 5 mg by mouth every evening.     levothyroxine (SYNTHROID) 75 MCG tablet Take 1 tablet (75 mcg total) by mouth daily. 90 tablet 3   lovastatin (MEVACOR) 40 MG tablet Take 2 tablets (80 mg total) by mouth at bedtime. 180 tablet 3   metoprolol succinate (TOPROL-XL) 100 MG 24 hr tablet Take 1.5 tablets (150 mg total) by mouth daily. Take with or immediately following a meal. 135 tablet 3   pantoprazole (PROTONIX) 40 MG  tablet Take 1 tablet (40 mg total) by mouth 2 (two) times daily before a meal. (Patient taking differently: Take 40 mg by mouth 2 (two) times daily.) 180 tablet 3   triamcinolone cream (KENALOG) 0.1 % Apply 1 application  topically daily as needed (psoriasis).     TURMERIC CURCUMIN PO Take 1 tablet by mouth daily.     No facility-administered medications prior to visit.    Allergies  Allergen Reactions   Macrodantin [Nitrofurantoin Macrocrystal] Shortness Of Breath, Diarrhea and Other (See Comments)    GI upset   Adipex-P [Phentermine] Other (See Comments)    "All the side effects listed on the paper"   Asa [Aspirin] Other (See Comments)    Told to avoid due to kidney function   Bactrim [Sulfamethoxazole-Trimethoprim] Nausea And Vomiting   Cipro [Ciprofloxacin Hcl] Swelling   Levaquin [Levofloxacin In D5w] Other (See Comments)    GI upset   Lipitor [Atorvastatin] Other (See Comments)    Myalgia    Lyrica [Pregabalin] Swelling and Other (See Comments)    Headaches Confusion   Nsaids Other (See Comments)    Told to avoid due to kidney function   Vibramycin [Doxycycline] Nausea Only   Zinacef [Cefuroxime] Swelling   Neurontin [Gabapentin] Rash    ROS     Objective:    Physical Exam Constitutional:      General: She is not in acute distress. Cardiovascular:     Rate and Rhythm: Normal rate and regular rhythm.  Pulmonary:     Effort: Pulmonary effort is normal.     Breath sounds: Normal breath sounds.  Abdominal:     General: Bowel sounds are normal.     Palpations: Abdomen is soft.  Tenderness: There is abdominal tenderness in the suprapubic area and left lower quadrant. There is guarding. There is no right CVA tenderness, left CVA tenderness or rebound.  Skin:    General: Skin is warm and dry.  Neurological:     Mental Status: She is alert and oriented to person, place, and time.  Psychiatric:        Attention and Perception: Attention normal.        Mood and  Affect: Mood normal.        Speech: Speech normal.        Behavior: Behavior normal.     BP (!) 170/98 (BP Location: Left Arm, Patient Position: Sitting, Cuff Size: Large)   Pulse (!) 55   Temp 97.6 F (36.4 C) (Temporal)   Ht 5' 2"  (1.575 m)   Wt 185 lb (83.9 kg)   SpO2 98%   BMI 33.84 kg/m  Wt Readings from Last 3 Encounters:  01/02/22 185 lb (83.9 kg)  12/22/21 184 lb (83.5 kg)  12/05/21 186 lb (84.4 kg)       Assessment & Plan:   Problem List Items Addressed This Visit       Cardiovascular and Mediastinum   Essential hypertension     Genitourinary   CKD (chronic kidney disease) stage 4, GFR 15-29 ml/min ([place])   Other Visit Diagnoses     Acute urinary retention    -  Primary   Unable to void       Single kidney          Patient here due to acute urinary retention. Unable to void since last night. She drank fluids this morning. Having abdominal pain in the lobby but has since improved since being in the exam room.  Blood pressure elevated and reportedly has been elevated x 4 weeks.  States she has a single kidney due to infection and had stage 4 renal disease.  Lisinopril stopped due to diarrhea.  Continue amlodipine, metoprolol and hydralazine.   Under the care of nephrologist   Her husband will take her to the [facility] ED for urinary retention. Still unable to void in the office.   I am having Cereniti P. Plasencia maintain her acetaminophen, Vitamin D3, clobetasol, levocetirizine, triamcinolone cream, vitamin C, TURMERIC CURCUMIN PO, aspirin EC, estradiol, amLODipine, hydrALAZINE, levothyroxine, lovastatin, pantoprazole, famotidine, and metoprolol succinate.  No orders of the defined types were placed in this encounter.

## 2022-01-02 NOTE — Discharge Instructions (Addendum)
Recommend you maintain a foley catheter until follow-up with urology.

## 2022-01-02 NOTE — Patient Instructions (Signed)
Please go to the [facility] emergency department for acute urinary retention.

## 2022-01-02 NOTE — ED Triage Notes (Signed)
Pt c/o urinary retention. Pt states she has CKD, has one kidney, and was told by her PCP to come in today. Pt states she started retaining this morning, has only had "a trickle every time I go".

## 2022-01-02 NOTE — ED Provider Triage Note (Signed)
Emergency Medicine Provider Triage Evaluation Note  Heather Dawson , a 66 y.o. female  was evaluated in triage.  Pt complains of not being able to urinate.  Patient reports she feels like she urinates but he has a few drops come out patient has kidney failure.  Patient's primary care MD sent her here for evaluation  Review of Systems  Positive: decreased urine output Negative: No fever or chills  Physical Exam  BP (!) 196/86   Pulse 61   Temp 97.9 F (36.6 C) (Oral)   Resp 18   Ht 5' 2"  (1.575 m)   Wt 83.9 kg   SpO2 97%   BMI 33.84 kg/m  Gen:   Awake, no distress   Resp:  Normal effort  MSK:   Moves extremities without difficulty  Other:    Medical Decision Making  Medically screening exam initiated at 1:03 PM.  Appropriate orders placed.  Heather Dawson was informed that the remainder of the evaluation will be completed by another provider, this initial triage assessment does not replace that evaluation, and the importance of remaining in the ED until their evaluation is complete.     Fransico Meadow, [place] 01/02/22 1304

## 2022-01-09 ENCOUNTER — Ambulatory Visit (INDEPENDENT_AMBULATORY_CARE_PROVIDER_SITE_OTHER): Payer: PPO | Admitting: Internal Medicine

## 2022-01-09 VITALS — BP 160/88 | HR 67 | Temp 97.7°F | Ht 62.0 in | Wt 186.0 lb

## 2022-01-09 DIAGNOSIS — N3001 Acute cystitis with hematuria: Secondary | ICD-10-CM

## 2022-01-09 DIAGNOSIS — I10 Essential (primary) hypertension: Secondary | ICD-10-CM | POA: Diagnosis not present

## 2022-01-09 DIAGNOSIS — N184 Chronic kidney disease, stage 4 (severe): Secondary | ICD-10-CM | POA: Diagnosis not present

## 2022-01-09 DIAGNOSIS — N1832 Chronic kidney disease, stage 3b: Secondary | ICD-10-CM | POA: Diagnosis not present

## 2022-01-09 DIAGNOSIS — E1169 Type 2 diabetes mellitus with other specified complication: Secondary | ICD-10-CM | POA: Diagnosis not present

## 2022-01-09 DIAGNOSIS — E669 Obesity, unspecified: Secondary | ICD-10-CM

## 2022-01-09 MED ORDER — LABETALOL HCL 200 MG PO TABS: 400.0000 mg | ORAL_TABLET | Freq: Two times a day (BID) | ORAL | 3 refills | Status: DC

## 2022-01-09 NOTE — Progress Notes (Unsigned)
Patient ID: Heather Dawson, female   DOB: June 03, 1955, 66 y.o.   MRN: [id number]        Chief Complaint: follow up UTI with gross clots, urinary retention, htn, ckd       HPI:  Heather Dawson is a 66 y.o. female here after recent ED visit with abd pain, felt to have UTI based on UA (no culture done), tx with one dose fosfomycin and this appears to have resolved her abd pain, urinary retention and further gross hematuria.  Was d/c form ED with indwelling catheter but fell out by itself the next day. Has appt with urology dec 5 wondering if she needs to keep this.  BP at home has been consistently elevated during this period and persists despite improved urinary symptoms, with SBP often > 160.  Also has f/u with renal Dr Arty Baumgartner Dec 14  Pt denies chest pain, increased sob or doe, wheezing, orthopnea, PND, increased LE swelling, palpitations, dizziness or syncope.   Pt denies polydipsia, polyuria, or new focal neuro s/s.        Wt Readings from Last 3 Encounters:  01/09/22 186 lb (84.4 kg)  01/02/22 185 lb (83.9 kg)  01/02/22 185 lb (83.9 kg)   BP Readings from Last 3 Encounters:  01/09/22 (!) 160/88  01/02/22 (!) 155/85  01/02/22 (!) 170/98         Past Medical History:  Diagnosis Date   Abdominal pain, epigastric 12/20/2006   ALLERGIC RHINITIS 12/25/2006   Allergy    ANXIETY 10/16/2006   no per pt   Arthritis    right shoulder   BACK PAIN 06/15/2008   Blood transfusion without reported diagnosis    BRADYCARDIA 05/04/2008   C. difficile diarrhea    Carpal tunnel syndrome 10/16/2006   Cellulitis and abscess of other specified site 06/15/2008   CHEST PAIN 09/08/2009   Chronic low back pain    CIRRHOSIS 10/16/2006   CKD (chronic kidney disease)    2008- had Ecoli and caused renal failure, no problems since then   CKD (chronic kidney disease) stage 3, GFR 30-59 ml/min ([place]) 10/16/2006   Qualifier: Diagnosis of  By: Jenny Reichmann MD, [place] 45/36/4680   Complication of anesthesia     awake during 2 surgeries   CONTUSION, LOWER LEG 09/15/2008   DEPRESSION 10/16/2006   no per pt   Diabetes mellitus without complication ([place])    Esophageal reflux 12/25/2006   HYPERLIPIDEMIA 12/25/2006   Hyperlipidemia    HYPERTENSION 08/15/2006   Hypertension    HYPOTHYROIDISM 08/15/2006   Kidney failure, acute ([place]) 2009   as a result of severe E Coli infection   Left-sided carotid artery disease ([place])    MIGRAINE HEADACHE 10/16/2006   Obesity 10/16/2006   OVARIAN CYST 12/20/2006   Psoriasis 01/03/2011   SHINGLES, HX OF 12/20/2006   Stroke ([place])    3 strokes 2008-no deficits, only on ASA   TRANSIENT ISCHEMIC ATTACKS, HX OF 12/20/2006   VERTIGO 09/15/2007   VITAMIN B12 DEFICIENCY 01/09/2007   Past Surgical History:  Procedure Laterality Date   ABDOMINAL HYSTERECTOMY  02/2006   ANTERIOR CERVICAL DECOMP/DISCECTOMY FUSION  2005   BIOPSY  11/27/2021   Procedure: BIOPSY;  Surgeon: Yetta Flock, MD;  Location: [place] [facility];  Service: Gastroenterology;;   CESAREAN SECTION     CHOLECYSTECTOMY     COLONOSCOPY     COLONOSCOPY WITH PROPOFOL N/A 11/27/2021   Procedure: COLONOSCOPY WITH PROPOFOL;  Surgeon:  Armbruster, Carlota Raspberry, MD;  Location: [place];  Service: Gastroenterology;  Laterality: N/A;   ENDARTERECTOMY Left 08/25/2014   Procedure: LEFT CAROTID ENDARTERECTOMY WITH HEMASHIELD PATCH ANGIOPLASTY;  Surgeon: Elam Dutch, MD;  Location: [facility] OR;  Service: Vascular;  Laterality: Left;   ESOPHAGOGASTRODUODENOSCOPY (EGD) WITH PROPOFOL N/A 11/27/2021   Procedure: ESOPHAGOGASTRODUODENOSCOPY (EGD) WITH PROPOFOL;  Surgeon: Yetta Flock, MD;  Location: [place];  Service: Gastroenterology;  Laterality: N/A;   NECK SURGERY  2005   ROTATOR CUFF REPAIR Right 02/2014   Dr Veverly Fells    reports that she has quit smoking. She has never used smokeless tobacco. She reports that she does not drink alcohol and does not use drugs. family history includes Diabetes in her father, sister,  and another family member; Heart disease in her brother, brother, father, and mother; Hypertension in her brother, brother and another family member; Lupus in her mother; Raynaud syndrome in her mother; Stroke in her father. Allergies  Allergen Reactions   Macrodantin [Nitrofurantoin Macrocrystal] Shortness Of Breath, Diarrhea and Other (See Comments)    GI upset   Adipex-P [Phentermine] Other (See Comments)    "All the side effects listed on the paper"   Asa [Aspirin] Other (See Comments)    Told to avoid due to kidney function   Bactrim [Sulfamethoxazole-Trimethoprim] Nausea And Vomiting   Cipro [Ciprofloxacin Hcl] Swelling   Levaquin [Levofloxacin In D5w] Other (See Comments)    GI upset   Lipitor [Atorvastatin] Other (See Comments)    Myalgia    Lyrica [Pregabalin] Swelling and Other (See Comments)    Headaches Confusion   Nsaids Other (See Comments)    Told to avoid due to kidney function   Vibramycin [Doxycycline] Nausea Only   Zinacef [Cefuroxime] Swelling   Neurontin [Gabapentin] Rash   Current Outpatient Medications on File Prior to Visit  Medication Sig Dispense Refill   acetaminophen (TYLENOL) 500 MG tablet Take 1,000 mg by mouth every 8 (eight) hours as needed (pain).     amLODipine (NORVASC) 10 MG tablet 1 tab by mouth once daily (Patient taking differently: Take 10 mg by mouth daily.) 90 tablet 3   Ascorbic Acid (VITAMIN C) 1000 MG tablet Take 1,000 mg by mouth daily.     aspirin EC 81 MG tablet Take 81 mg by mouth daily.     Cholecalciferol (VITAMIN D3) 1000 units CAPS Take 2 capsules (2,000 Units total) by mouth daily. 60 capsule 0   clobetasol (TEMOVATE) 0.05 % external solution Apply 1 application topically daily as needed (scalp psoriasis).   0   estradiol (ESTRACE) 0.1 MG/GM vaginal cream Place 1 Applicatorful vaginally See admin instructions. Apply to vagina twice weekly on Monday, Thursday.     famotidine (PEPCID) 20 MG tablet Take 1 tablet (20 mg total) by  mouth 2 (two) times daily. 180 tablet 3   hydrALAZINE (APRESOLINE) 100 MG tablet Take 1 tablet (100 mg total) by mouth 3 (three) times daily. 90 tablet 11   levocetirizine (XYZAL) 5 MG tablet Take 5 mg by mouth every evening.     levothyroxine (SYNTHROID) 75 MCG tablet Take 1 tablet (75 mcg total) by mouth daily. 90 tablet 3   lovastatin (MEVACOR) 40 MG tablet Take 2 tablets (80 mg total) by mouth at bedtime. 180 tablet 3   pantoprazole (PROTONIX) 40 MG tablet Take 1 tablet (40 mg total) by mouth 2 (two) times daily before a meal. (Patient taking differently: Take 40 mg by mouth 2 (two) times daily.) 180  tablet 3   triamcinolone cream (KENALOG) 0.1 % Apply 1 application  topically daily as needed (psoriasis).     TURMERIC CURCUMIN PO Take 1 tablet by mouth daily.     No current facility-administered medications on file prior to visit.        ROS:  All others reviewed and negative.  Objective        PE:  BP (!) 160/88 (BP Location: Left Arm, Patient Position: Sitting, Cuff Size: Large)   Pulse 67   Temp 97.7 F (36.5 C) (Oral)   Ht 5' 2"  (1.575 m)   Wt 186 lb (84.4 kg)   SpO2 98%   BMI 34.02 kg/m                 Constitutional: Pt appears in NAD, obese - regained some of her wt               HENT: Head: NCAT.                Right Ear: External ear normal.                 Left Ear: External ear normal.                Eyes: . Pupils are equal, round, and reactive to light. Conjunctivae and EOM are normal               Nose: without d/c or deformity               Neck: Neck supple. Gross normal ROM               Cardiovascular: Normal rate and regular rhythm.                 Pulmonary/Chest: Effort normal and breath sounds without rales or wheezing.                Abd:  Soft, NT, ND, + BS, no organomegaly               Neurological: Pt is alert. At baseline orientation, motor grossly intact               Skin: Skin is warm. No rashes, no other new lesions, LE edema - none                Psychiatric: Pt behavior is normal without agitation   Micro: none  Cardiac tracings I have personally interpreted today:  none  Pertinent Radiological findings (summarize): none   Lab Results  Component Value Date   WBC 9.3 01/02/2022   HGB 13.2 01/02/2022   HCT 39.4 01/02/2022   PLT 218 01/02/2022   GLUCOSE 105 (H) 01/02/2022   CHOL 173 10/06/2021   TRIG 247.0 (H) 10/06/2021   HDL 40.70 10/06/2021   LDLDIRECT 107.0 10/06/2021   LDLCALC 113 (H) 05/16/2021   ALT 26 01/02/2022   AST 24 01/02/2022   NA 143 01/02/2022   K 3.8 01/02/2022   CL 110 01/02/2022   CREATININE 1.47 (H) 01/02/2022   BUN 20 01/02/2022   CO2 25 01/02/2022   TSH 0.25 (L) 11/23/2021   INR 0.99 08/17/2014   HGBA1C 6.3 10/06/2021   MICROALBUR <0.7 05/16/2021   Assessment/Plan:  Shreshta Medley Nakagawa is a 66 y.o. White or Caucasian [1] female with  has a past medical history of Abdominal pain, epigastric (12/20/2006), ALLERGIC RHINITIS (12/25/2006), Allergy, ANXIETY (10/16/2006), Arthritis, BACK PAIN (06/15/2008), Blood transfusion without  reported diagnosis, BRADYCARDIA (05/04/2008), C. difficile diarrhea, Carpal tunnel syndrome (10/16/2006), Cellulitis and abscess of other specified site (06/15/2008), CHEST PAIN (09/08/2009), Chronic low back pain, CIRRHOSIS (10/16/2006), CKD (chronic kidney disease), CKD (chronic kidney disease) stage 3, GFR 30-59 ml/min ([place]) (10/16/2006), COMMON MIGRAINE (36/46/8032), Complication of anesthesia, CONTUSION, LOWER LEG (09/15/2008), DEPRESSION (10/16/2006), Diabetes mellitus without complication ([place]), Esophageal reflux (12/25/2006), HYPERLIPIDEMIA (12/25/2006), Hyperlipidemia, HYPERTENSION (08/15/2006), Hypertension, HYPOTHYROIDISM (08/15/2006), Kidney failure, acute ([place]) (2009), Left-sided carotid artery disease ([place]), MIGRAINE HEADACHE (10/16/2006), Obesity (10/16/2006), OVARIAN CYST (12/20/2006), Psoriasis (01/03/2011), SHINGLES, HX OF (12/20/2006), Stroke ([place]), TRANSIENT ISCHEMIC ATTACKS, HX OF  (12/20/2006), VERTIGO (09/15/2007), and VITAMIN B12 DEFICIENCY (01/09/2007).  UTI (urinary tract infection) Presumed infection as resolved with fosfomycin dose x 1; did have gross clots and retention with this, indwelling catheter already out on its own, and pt encouraged to f/u with urology dec 5 as planned as may need cysto to further evaluate  CKD (chronic kidney disease) stage 3, GFR 30-59 ml/min (H[facility]) Lab Results  Component Value Date   CREATININE 1.47 (H) 01/02/2022   Stable overall, cont to avoid nephrotoxins   Diabetes mellitus type 2 in obese [facility]) Lab Results  Component Value Date   HGBA1C 6.3 10/06/2021   Stable, pt to continue current medical treatment  - diet, wt control, excercise   Essential hypertension BP Readings from Last 3 Encounters:  01/09/22 (!) 160/88  01/02/22 (!) 155/85  01/02/22 (!) 170/98   Uncontrolled with CKD,  pt to continue medical treatment norvasc 10 mg qd, hydralazine 100 tid, but change toprol xl to Labetolol 400 bid   CKD (chronic kidney disease) stage 4, GFR 15-29 ml/min (HCC) Lab Results  Component Value Date   CREATININE 1.47 (H) 01/02/2022   Stable overall, cont to avoid nephrotoxins, to f/u with renal as planned  Followup: Return in about 3 months (around 04/11/2022).  [name], MD 01/11/2022 1:23 PM [facility]

## 2022-01-09 NOTE — Patient Instructions (Signed)
Ok to change the toprol Xl to Labetolol 400 mg twice per day  Please continue all other medications as before, and refills have been done if requested.  Please have the pharmacy call with any other refills you may need.  Please continue your efforts at being more active, low cholesterol diet, and weight control.  You are otherwise up to date with prevention measures today.  Please keep your appointments with your specialists as you may have planned - urology dec 5, and renal dec 14  Please make an Appointment to return in 3 months, or sooner if needed

## 2022-01-11 DIAGNOSIS — N390 Urinary tract infection, site not specified: Secondary | ICD-10-CM | POA: Insufficient documentation

## 2022-01-11 NOTE — Assessment & Plan Note (Signed)
BP Readings from Last 3 Encounters:  01/09/22 (!) 160/88  01/02/22 (!) 155/85  01/02/22 (!) 170/98   Uncontrolled with CKD,  pt to continue medical treatment norvasc 10 mg qd, hydralazine 100 tid, but change toprol xl to Labetolol 400 bid

## 2022-01-11 NOTE — Assessment & Plan Note (Signed)
Presumed infection as resolved with fosfomycin dose x 1; did have gross clots and retention with this, indwelling catheter already out on its own, and pt encouraged to f/u with urology dec 5 as planned as may need cysto to further evaluate

## 2022-01-11 NOTE — Assessment & Plan Note (Signed)
Lab Results  Component Value Date   CREATININE 1.47 (H) 01/02/2022   Stable overall, cont to avoid nephrotoxins, to f/u with renal as planned

## 2022-01-11 NOTE — Assessment & Plan Note (Signed)
Lab Results  Component Value Date   CREATININE 1.47 (H) 01/02/2022   Stable overall, cont to avoid nephrotoxins

## 2022-01-11 NOTE — Assessment & Plan Note (Signed)
Lab Results  Component Value Date   HGBA1C 6.3 10/06/2021   Stable, pt to continue current medical treatment  - diet, wt control, excercise

## 2022-01-15 ENCOUNTER — Encounter: Payer: Self-pay | Admitting: Internal Medicine

## 2022-01-23 DIAGNOSIS — R338 Other retention of urine: Secondary | ICD-10-CM | POA: Diagnosis not present

## 2022-02-01 DIAGNOSIS — I10 Essential (primary) hypertension: Secondary | ICD-10-CM | POA: Diagnosis not present

## 2022-02-01 DIAGNOSIS — I129 Hypertensive chronic kidney disease with stage 1 through stage 4 chronic kidney disease, or unspecified chronic kidney disease: Secondary | ICD-10-CM | POA: Diagnosis not present

## 2022-02-01 DIAGNOSIS — K219 Gastro-esophageal reflux disease without esophagitis: Secondary | ICD-10-CM | POA: Diagnosis not present

## 2022-02-01 DIAGNOSIS — N183 Chronic kidney disease, stage 3 unspecified: Secondary | ICD-10-CM | POA: Diagnosis not present

## 2022-02-01 DIAGNOSIS — E039 Hypothyroidism, unspecified: Secondary | ICD-10-CM | POA: Diagnosis not present

## 2022-02-14 ENCOUNTER — Encounter: Payer: Self-pay | Admitting: Family Medicine

## 2022-02-14 ENCOUNTER — Ambulatory Visit (INDEPENDENT_AMBULATORY_CARE_PROVIDER_SITE_OTHER): Payer: PPO | Admitting: Family Medicine

## 2022-02-14 VITALS — BP 120/68 | HR 87 | Temp 98.9°F | Ht 62.0 in | Wt 185.0 lb

## 2022-02-14 DIAGNOSIS — R058 Other specified cough: Secondary | ICD-10-CM

## 2022-02-14 DIAGNOSIS — R0989 Other specified symptoms and signs involving the circulatory and respiratory systems: Secondary | ICD-10-CM

## 2022-02-14 DIAGNOSIS — J069 Acute upper respiratory infection, unspecified: Secondary | ICD-10-CM | POA: Diagnosis not present

## 2022-02-14 LAB — POC COVID19 BINAXNOW: SARS Coronavirus 2 Ag: NEGATIVE

## 2022-02-14 MED ORDER — AZITHROMYCIN 250 MG PO TABS: ORAL_TABLET | ORAL | 0 refills | Status: AC

## 2022-02-14 NOTE — Patient Instructions (Signed)
Your COVID test is negative.  Take the antibiotic as prescribed.  I also recommend taking over-the-counter medications such as Mucinex and Flonase nasal spray for nasal congestion as needed.  You can take Tylenol.  Stay well-hydrated.  Follow-up if you are getting worse or not improving in the next 2 to 3 days.

## 2022-02-14 NOTE — Progress Notes (Signed)
Subjective:  Heather Dawson is a 66 y.o. female who presents for 4-5 day hx of worsening cough productive of yellow mucus, chest congestion, sneezing, nasal congestion and rhinorrhea. Shortness of breath at times.   Denies fever, chills, body aches, chest pain, abdominal pain, N/V/D.    No other aggravating or relieving factors.  No other c/o.  ROS as in subjective.   Objective: Vitals:   02/14/22 1609  BP: 120/68  Pulse: 87  Temp: 98.9 F (37.2 C)  SpO2: 97%    General appearance: Alert, WD/WN, no distress, mildly ill appearing                             Skin: warm, no rash                           Head: no sinus tenderness                            Eyes: conjunctiva normal, corneas clear, PERRLA                            Ears: pearly TMs, external ear canals normal                          Nose: septum midline, turbinates swollen, with erythema and clear discharge             Mouth/throat: MMM, tongue normal, mild pharyngeal erythema                           Neck: supple, no adenopathy, no thyromegaly, nontender                          Heart: RRR                         Lungs: CTA bilaterally, no wheezes, rales, or rhonchi      Assessment: Productive cough - Plan: azithromycin (ZITHROMAX) 250 MG tablet, POC COVID-19  Chest congestion - Plan: POC COVID-19  Acute URI - Plan: POC COVID-19   Plan: Covid test is negative.  Azithromycin prescribed.  Suggested symptomatic OTC remedies. Nasal saline spray for congestion.   Tylenol prn Follow up if worsening or not improving in the next 2-3 days.

## 2022-02-16 ENCOUNTER — Encounter: Payer: Self-pay | Admitting: Family Medicine

## 2022-02-16 ENCOUNTER — Other Ambulatory Visit: Payer: Self-pay | Admitting: Family Medicine

## 2022-02-16 DIAGNOSIS — R062 Wheezing: Secondary | ICD-10-CM

## 2022-02-16 DIAGNOSIS — R0989 Other specified symptoms and signs involving the circulatory and respiratory systems: Secondary | ICD-10-CM

## 2022-02-16 MED ORDER — PREDNISONE 20 MG PO TABS: 40.0000 mg | ORAL_TABLET | Freq: Every day | ORAL | 0 refills | Status: DC

## 2022-02-16 NOTE — Telephone Encounter (Signed)
Pt feeling a little better from antibiotics on 12/27 but still has a lot of chest congestion and is wheezing. Please advise

## 2022-02-22 ENCOUNTER — Encounter: Payer: Self-pay | Admitting: Family Medicine

## 2022-02-22 NOTE — Telephone Encounter (Signed)
Pt finished prednisone and is still having cough and runny nose. She is wondering if you recommend another round?

## 2022-02-26 ENCOUNTER — Telehealth: Payer: Self-pay | Admitting: Internal Medicine

## 2022-02-26 NOTE — Telephone Encounter (Signed)
Left message for patient to call back to schedule Medicare Annual Wellness Visit   Last AWV  03/07/21  Please schedule at anytime with [facility] if patient calls the office back.     Any questions, please call me at [phone]

## 2022-03-01 DIAGNOSIS — E782 Mixed hyperlipidemia: Secondary | ICD-10-CM | POA: Diagnosis not present

## 2022-03-01 LAB — HEPATIC FUNCTION PANEL
ALT: 15 IU/L (ref 0–32)
AST: 13 IU/L (ref 0–40)
Albumin: 4.4 g/dL (ref 3.9–4.9)
Alkaline Phosphatase: 67 IU/L (ref 44–121)
Bilirubin Total: 0.4 mg/dL (ref 0.0–1.2)
Bilirubin, Direct: 0.12 mg/dL (ref 0.00–0.40)
Total Protein: 6.3 g/dL (ref 6.0–8.5)

## 2022-03-01 LAB — LIPID PANEL
Chol/HDL Ratio: 5.9 ratio — ABNORMAL HIGH (ref 0.0–4.4)
Cholesterol, Total: 259 mg/dL — ABNORMAL HIGH (ref 100–199)
HDL: 44 mg/dL (ref >39)
LDL Chol Calc (NIH): 179 mg/dL — ABNORMAL HIGH (ref 0–99)
Triglycerides: 193 mg/dL — ABNORMAL HIGH (ref 0–149)
VLDL Cholesterol Cal: 36 mg/dL (ref 5–40)

## 2022-03-02 ENCOUNTER — Other Ambulatory Visit: Payer: Self-pay

## 2022-03-02 ENCOUNTER — Encounter: Payer: Self-pay | Admitting: Cardiovascular Disease

## 2022-03-02 DIAGNOSIS — E782 Mixed hyperlipidemia: Secondary | ICD-10-CM

## 2022-03-02 DIAGNOSIS — I10 Essential (primary) hypertension: Secondary | ICD-10-CM | POA: Diagnosis not present

## 2022-03-02 DIAGNOSIS — N183 Chronic kidney disease, stage 3 unspecified: Secondary | ICD-10-CM | POA: Diagnosis not present

## 2022-03-04 ENCOUNTER — Encounter: Payer: Self-pay | Admitting: Family Medicine

## 2022-03-04 DIAGNOSIS — N390 Urinary tract infection, site not specified: Secondary | ICD-10-CM

## 2022-03-05 ENCOUNTER — Other Ambulatory Visit: Payer: Self-pay | Admitting: Family Medicine

## 2022-03-05 ENCOUNTER — Other Ambulatory Visit (INDEPENDENT_AMBULATORY_CARE_PROVIDER_SITE_OTHER): Payer: PPO

## 2022-03-05 DIAGNOSIS — N390 Urinary tract infection, site not specified: Secondary | ICD-10-CM

## 2022-03-05 LAB — URINALYSIS, ROUTINE W REFLEX MICROSCOPIC
Bilirubin Urine: NEGATIVE
Hgb urine dipstick: NEGATIVE
Ketones, ur: NEGATIVE
Nitrite: NEGATIVE
Specific Gravity, Urine: 1.01 (ref 1.000–1.030)
Urine Glucose: NEGATIVE
Urobilinogen, UA: 0.2 (ref 0.0–1.0)
pH: 6.5 (ref 5.0–8.0)

## 2022-03-05 MED ORDER — FOSFOMYCIN TROMETHAMINE 3 G PO PACK: 3.0000 g | PACK | Freq: Once | ORAL | 0 refills | Status: AC

## 2022-03-05 NOTE — Progress Notes (Signed)
Please let her know that I sent in an antibiotic for her urinary tract symptoms. It is a one time dose in a powder form called Fosfomycin. She does not have an allergy to this medication per the record. If she is not improving in the next 2-3 days or if she is getting worse, please have her come in to be seen.

## 2022-03-06 ENCOUNTER — Encounter: Payer: Self-pay | Admitting: Family Medicine

## 2022-03-19 ENCOUNTER — Ambulatory Visit (INDEPENDENT_AMBULATORY_CARE_PROVIDER_SITE_OTHER): Payer: PPO

## 2022-03-19 VITALS — Ht 62.0 in | Wt 185.0 lb

## 2022-03-19 DIAGNOSIS — Z0000 Encounter for general adult medical examination without abnormal findings: Secondary | ICD-10-CM | POA: Diagnosis not present

## 2022-03-19 NOTE — Progress Notes (Signed)
Virtual Visit via Telephone Note  I connected with  Heather Dawson on 03/19/22 at  8:45 AM EST by telephone and verified that I am speaking with the correct person using two identifiers.  Location: Patient: Home Provider: [place] Persons participating in the virtual visit: [place]   I discussed the limitations, risks, security and privacy concerns of performing an evaluation and management service by telephone and the availability of in person appointments. The patient expressed understanding and agreed to proceed.  Interactive audio and video telecommunications were attempted between this nurse and patient, however failed, due to patient having technical difficulties OR patient did not have access to video capability.  We continued and completed visit with audio only.  Some vital signs may be absent or patient reported.   Sheral Flow, LPN  Subjective:   Heather Dawson is a 67 y.o. female who presents for Medicare Annual (Subsequent) preventive examination.  Review of Systems     Cardiac Risk Factors include: advanced age (>50mn, >>18women);family history of premature cardiovascular disease;dyslipidemia;hypertension;obesity (BMI >30kg/m2);sedentary lifestyle     Objective:    Today's Vitals   03/19/22 0847  Dawson: 185 lb (83.9 kg)  Height: '5\' 2"'$  (1.575 m)  PainSc: 0-No pain   Body mass index is 33.84 kg/m.     03/19/2022    8:51 AM 01/02/2022   12:54 PM 11/25/2021   10:00 PM 11/24/2021    7:40 PM 03/07/2021   11:54 AM 10/13/2020    6:00 AM 10/12/2020    1:06 PM  Advanced Directives  Does Patient Have a Medical Advance Directive? Yes No Yes No No No No  Type of [place]  Living will;Healthcare Power of Attorney      Does patient want to make changes to medical advance directive?   No - Patient declined      Copy of [place] Chart? No - copy requested        Would  patient like information on creating a medical advance directive?  No - Patient declined  No - Patient declined No - Patient declined No - Patient declined     Current Medications (verified) Outpatient Encounter Medications as of 03/19/2022  Medication Sig   carvedilol (COREG) 3.125 MG tablet Take 3.125 mg by mouth 2 (two) times daily.   olmesartan (BENICAR) 5 MG tablet Take 5 mg by mouth daily.   acetaminophen (TYLENOL) 500 MG tablet Take 1,000 mg by mouth every 8 (eight) hours as needed (pain).   amLODipine (NORVASC) 10 MG tablet 1 tab by mouth once daily (Patient taking differently: Take 10 mg by mouth daily.)   Ascorbic Acid (VITAMIN C) 1000 MG tablet Take 1,000 mg by mouth daily.   aspirin EC 81 MG tablet Take 81 mg by mouth daily.   Cholecalciferol (VITAMIN D3) 1000 units CAPS Take 2 capsules (2,000 Units total) by mouth daily.   clobetasol (TEMOVATE) 0.05 % external solution Apply 1 application topically daily as needed (scalp psoriasis).    estradiol (ESTRACE) 0.1 MG/GM vaginal cream Place 1 Applicatorful vaginally See admin instructions. Apply to vagina twice weekly on Monday, Thursday.   famotidine (PEPCID) 20 MG tablet Take 1 tablet (20 mg total) by mouth 2 (two) times daily.   hydrALAZINE (APRESOLINE) 100 MG tablet Take 1 tablet (100 mg total) by mouth 3 (three) times daily.   levocetirizine (XYZAL) 5 MG tablet Take 5 mg by mouth every evening.  levothyroxine (SYNTHROID) 75 MCG tablet Take 1 tablet (75 mcg total) by mouth daily.   lovastatin (MEVACOR) 40 MG tablet Take 2 tablets (80 mg total) by mouth at bedtime.   pantoprazole (PROTONIX) 40 MG tablet Take 1 tablet (40 mg total) by mouth 2 (two) times daily before a meal. (Patient taking differently: Take 40 mg by mouth 2 (two) times daily.)   predniSONE (DELTASONE) 20 MG tablet Take 2 tablets (40 mg total) by mouth daily with breakfast.   triamcinolone cream (KENALOG) 0.1 % Apply 1 application  topically daily as needed  (psoriasis).   TURMERIC CURCUMIN PO Take 1 tablet by mouth daily.   [DISCONTINUED] labetalol (NORMODYNE) 200 MG tablet Take 2 tablets (400 mg total) by mouth 2 (two) times daily. (Patient not taking: Reported on 02/14/2022)   No facility-administered encounter medications on file as of 03/19/2022.    Allergies (verified) Macrodantin [nitrofurantoin macrocrystal], Adipex-p [phentermine], Asa [aspirin], Bactrim [sulfamethoxazole-trimethoprim], Cipro [ciprofloxacin hcl], Levaquin [levofloxacin in d5w], Lipitor [atorvastatin], Lyrica [pregabalin], Nsaids, Vibramycin [doxycycline], Zinacef [cefuroxime], and Neurontin [gabapentin]   History: Past Medical History:  Diagnosis Date   Abdominal pain, epigastric 12/20/2006   ALLERGIC RHINITIS 12/25/2006   Allergy    ANXIETY 10/16/2006   no per pt   Arthritis    right shoulder   BACK PAIN 06/15/2008   Blood transfusion without reported diagnosis    BRADYCARDIA 05/04/2008   C. difficile diarrhea    Carpal tunnel syndrome 10/16/2006   Cellulitis and abscess of other specified site 06/15/2008   CHEST PAIN 09/08/2009   Chronic low back pain    CIRRHOSIS 10/16/2006   CKD (chronic kidney disease)    2008- had Ecoli and caused renal failure, no problems since then   CKD (chronic kidney disease) stage 3, GFR 30-59 ml/min ([place]) 10/16/2006   Qualifier: Diagnosis of  By: Jenny Reichmann MD, [place] 03/00/9233   Complication of anesthesia    awake during 2 surgeries   CONTUSION, LOWER LEG 09/15/2008   DEPRESSION 10/16/2006   no per pt   Diabetes mellitus without complication ([place])    Esophageal reflux 12/25/2006   HYPERLIPIDEMIA 12/25/2006   Hyperlipidemia    HYPERTENSION 08/15/2006   Hypertension    HYPOTHYROIDISM 08/15/2006   Kidney failure, acute ([place]) 2009   as a result of severe E Coli infection   Left-sided carotid artery disease ([place])    MIGRAINE HEADACHE 10/16/2006   Obesity 10/16/2006   OVARIAN CYST 12/20/2006   Psoriasis 01/03/2011    SHINGLES, HX OF 12/20/2006   Stroke ([place])    3 strokes 2008-no deficits, only on ASA   TRANSIENT ISCHEMIC ATTACKS, HX OF 12/20/2006   VERTIGO 09/15/2007   VITAMIN B12 DEFICIENCY 01/09/2007   Past Surgical History:  Procedure Laterality Date   ABDOMINAL HYSTERECTOMY  02/2006   ANTERIOR CERVICAL DECOMP/DISCECTOMY FUSION  2005   BIOPSY  11/27/2021   Procedure: BIOPSY;  Surgeon: Yetta Flock, MD;  Location: [facility];  Service: Gastroenterology;;   CESAREAN SECTION     CHOLECYSTECTOMY     COLONOSCOPY     COLONOSCOPY WITH PROPOFOL N/A 11/27/2021   Procedure: COLONOSCOPY WITH PROPOFOL;  Surgeon: Yetta Flock, MD;  Location: [facility];  Service: Gastroenterology;  Laterality: N/A;   ENDARTERECTOMY Left 08/25/2014   Procedure: LEFT CAROTID ENDARTERECTOMY WITH HEMASHIELD PATCH ANGIOPLASTY;  Surgeon: Elam Dutch, MD;  Location: [facility];  Service: Vascular;  Laterality: Left;   ESOPHAGOGASTRODUODENOSCOPY (EGD) WITH PROPOFOL N/A 11/27/2021  Procedure: ESOPHAGOGASTRODUODENOSCOPY (EGD) WITH PROPOFOL;  Surgeon: Yetta Flock, MD;  Location: [facility];  Service: Gastroenterology;  Laterality: N/A;   NECK SURGERY  2005   ROTATOR CUFF REPAIR Right 02/2014   Dr Veverly Fells   Family History  Problem Relation Age of Onset   Stroke Father    Heart disease Father    Diabetes Father    Lupus Mother    Raynaud syndrome Mother    Heart disease Mother    Diabetes Sister    Heart disease Brother    Hypertension Brother    Heart disease Brother    Hypertension Brother    Diabetes Other    Hypertension Other    Colon cancer Neg Hx    Esophageal cancer Neg Hx    Rectal cancer Neg Hx    Stomach cancer Neg Hx    Social History   Socioeconomic History   Marital status: Married    Spouse name: [name]   Number of children: 2   Years of education: Not on file   Highest education level: Not on file  Occupational History   Occupation: retired  Tobacco Use   Smoking status:  Former   Smokeless tobacco: Never   Tobacco comments:    quit smoking over 30 years ago  Vaping Use   Vaping Use: Never used  Substance and Sexual Activity   Alcohol use: No   Drug use: No   Sexual activity: Not on file  Other Topics Concern   Not on file  Social History Narrative   Lives with husband   Right Handed   Drinks 2-4 cups coffee   Social Determinants of Health   Financial Resource Strain: Low Risk  (03/19/2022)   Overall Financial Resource Strain (CARDIA)    Difficulty of Paying Living Expenses: Not hard at all  Food Insecurity: No Food Insecurity (03/19/2022)   Hunger Vital Sign    Worried About Running Out of Food in the Last Year: Never true    Ran Out of Food in the Last Year: Never true  Transportation Needs: No Transportation Needs (03/19/2022)   PRAPARE - Hydrologist (Medical): No    Lack of Transportation (Non-Medical): No  Physical Activity: Inactive (03/19/2022)   Exercise Vital Sign    Days of Exercise per Week: 0 days    Minutes of Exercise per Session: 0 min  Stress: No Stress Concern Present (03/19/2022)   [place]    Feeling of Stress : Not at all  Social Connections: [place] (03/19/2022)   Social Connection and Isolation Panel [NHANES]    Frequency of Communication with Friends and Family: More than three times a week    Frequency of Social Gatherings with Friends and Family: More than three times a week    Attends Religious Services: More than 4 times per year    Active Member of [employer] or Organizations: Yes    Attends Music therapist: More than 4 times per year    Marital Status: Married    Tobacco Counseling Counseling given: Not Answered Tobacco comments: quit smoking over 30 years ago   Clinical Intake:  Pre-visit preparation completed: Yes  Pain : No/denies pain Pain Score: 0-No pain     BMI - recorded:  33.84 Nutritional Status: BMI > 30  Obese Nutritional Risks: None Diabetes: Yes (controlled with diet) CBG done?: No Did pt. bring in CBG monitor from home?: No  How often do you need to have someone help you when you read instructions, pamphlets, or other written materials from your doctor or pharmacy?: 1 - Never What is the last grade level you completed in school?: HSG  Diabetic? Yes; controlled by diet  Interpreter Needed?: No  Information entered by :: Lisette Abu, LPN.   Activities of Daily Living    03/19/2022    8:53 AM 11/25/2021   10:00 PM  In your present state of health, do you have any difficulty performing the following activities:  Hearing? 0 0  Vision? 0 0  Difficulty concentrating or making decisions? 0 0  Walking or climbing stairs? 0   Dressing or bathing? 0   Doing errands, shopping? 0 1  Preparing Food and eating ? N   Using the Toilet? N   In the past six months, have you accidently leaked urine? N   Do you have problems with loss of bowel control? N   Managing your Medications? N   Managing your Finances? N   Housekeeping or managing your Housekeeping? N     Patient Care Team: Biagio Borg, MD as PCP - General (Internal Medicine) Lorretta Harp, MD as PCP - Cardiology (Cardiology) Lorretta Harp, MD as Consulting Physician (Cardiology) Inda Castle, MD (Inactive) as Consulting Physician (Gastroenterology) Netta Cedars, MD as Consulting Physician (Orthopedic Surgery) Melina Schools, MD as Consulting Physician (Orthopedic Surgery)  Indicate any recent Medical Services you may have received from other than [facility] providers in the past year (date may be approximate).     Assessment:   This is a routine wellness examination for Heather Dawson.  Hearing/Vision screen Hearing Screening - Comments:: Denies hearing difficulties   Vision Screening - Comments:: Wears rx glasses - up to date with routine eye exams with Webb Laws,  OD.   Dietary issues and exercise activities discussed: Current Exercise Habits: The patient does not participate in regular exercise at present, Exercise limited by: None identified   Goals Addressed             This Visit's Progress    Client understands the importance of follow-up with providers by attending scheduled visits        Depression Screen    03/19/2022    8:53 AM 01/09/2022    8:04 AM 12/05/2021    8:15 AM 10/06/2021    9:29 AM 05/16/2021   11:15 AM 05/16/2021   10:49 AM 03/07/2021   11:56 AM  PHQ 2/9 Scores  PHQ - 2 Score 0 0  0 0 0 0  PHQ- 9 Score  0  0     Exception Documentation   Patient refusal        Fall Risk    03/19/2022    8:52 AM 01/09/2022    8:04 AM 12/05/2021    8:15 AM 10/06/2021    9:29 AM 05/16/2021   11:15 AM  Fall Risk   Falls in the past year? 0 0 0 0 0  Number falls in past yr: 0    0  Injury with Fall? 0    0  Risk for fall due to : No Fall Risks No Fall Risks No Fall Risks No Fall Risks   Follow up Falls prevention discussed Falls evaluation completed Falls evaluation completed Falls evaluation completed     [place]:  Any stairs in or around the home? No  If so, are there any without handrails? No  Home free of loose throw rugs in walkways, pet beds, electrical cords, etc? Yes  Adequate lighting in your home to reduce risk of falls? Yes   ASSISTIVE DEVICES UTILIZED TO PREVENT FALLS:  Life alert? No  Use of a cane, walker or w/c? No  Grab bars in the bathroom? No  Shower chair or bench in shower? Yes  Elevated toilet seat or a handicapped toilet? Yes   TIMED UP AND GO: Phone Visit  Was the test performed? No .   Cognitive Function:        03/19/2022    8:53 AM  6CIT Screen  What Year? 0 points  What month? 0 points  What time? 0 points  Count back from 20 0 points  Months in reverse 0 points  Repeat phrase 0 points  Total Score 0 points    Immunizations Immunization  History  Administered Date(s) Administered   Fluad Quad(high Dose 65+) 12/05/2021   Influenza Split 01/03/2011   Influenza Whole 12/28/2005, 01/07/2007, 11/18/2007, 11/18/2008   Influenza,inj,Quad PF,6+ Mos 12/30/2012, 11/06/2013, 11/10/2014, 12/20/2015, 11/13/2016, 10/24/2017, 10/31/2018, 03/23/2020, 12/27/2020   Influenza-Unspecified 10/29/2018, 11/19/2020   PFIZER Comirnaty(Gray Top)Covid-19 Tri-Sucrose Vaccine 07/03/2019, 07/24/2019, 02/04/2020   PFIZER(Purple Top)SARS-COV-2 Vaccination 07/03/2019, 07/24/2019, 02/04/2020   Pneumococcal Conjugate-13 05/03/2016   Pneumococcal Polysaccharide-23 04/23/2017   Td 08/30/2009   Td (Adult), 2 Lf Tetanus Toxid, Preservative Free 08/30/2009   Zoster Recombinat (Shingrix) 04/23/2017    TDAP status: Due, Education has been provided regarding the importance of this vaccine. Advised may receive this vaccine at local pharmacy or Health Dept. Aware to provide a copy of the vaccination record if obtained from local pharmacy or Health Dept. Verbalized acceptance and understanding.  Flu Vaccine status: Up to date  Pneumococcal vaccine status: Up to date  Covid-19 vaccine status: Completed vaccines  Qualifies for Shingles Vaccine? Yes   Zostavax completed No   Shingrix Completed?: No.    Education has been provided regarding the importance of this vaccine. Patient has been advised to call insurance company to determine out of pocket expense if they have not yet received this vaccine. Advised may also receive vaccine at local pharmacy or Health Dept. Verbalized acceptance and understanding.  Screening Tests Health Maintenance  Topic Date Due   DTaP/Tdap/Td (3 - Tdap) 08/31/2019   DEXA SCAN  Never done   Pneumonia Vaccine 13+ Years old (3 - PPSV23 or PCV20) 04/24/2022   MAMMOGRAM  03/22/2022   HEMOGLOBIN A1C  04/08/2022   OPHTHALMOLOGY EXAM  05/10/2022   Diabetic kidney evaluation - Urine ACR  05/17/2022   FOOT EXAM  05/17/2022   Diabetic kidney  evaluation - eGFR measurement  01/03/2023   Medicare Annual Wellness (AWV)  03/20/2023   COLONOSCOPY (Pts 45-49yr Insurance coverage will need to be confirmed)  11/28/2026   INFLUENZA VACCINE  Completed   Hepatitis C Screening  Completed   HIV Screening  Completed   HPV VACCINES  Aged Out   COVID-19 Vaccine  Discontinued   Zoster Vaccines- Shingrix  Discontinued    Health Maintenance  Health Maintenance Due  Topic Date Due   DTaP/Tdap/Td (3 - Tdap) 08/31/2019   DEXA SCAN  Never done   Pneumonia Vaccine 66 Years old (377- PPSV23 or PCV20) 04/24/2022    Colorectal cancer screening: Type of screening: Colonoscopy. Completed 11/27/2021. Repeat every 5-7 years  Mammogram status: Completed 03/22/2020. Repeat every year; scheduled for 03/2022   Bone Density status: Never done  Lung Cancer Screening: (Low Dose CT Chest  recommended if Age 91-80 years, 43 pack-year currently smoking OR have quit w/in 15years.) does not qualify.   Lung Cancer Screening Referral: no  Additional Screening:  Hepatitis C Screening: does qualify; Completed 04/27/2015  Vision Screening: Recommended annual ophthalmology exams for early detection of glaucoma and other disorders of the eye. Is the patient up to date with their annual eye exam?  Yes  Who is the provider or what is the name of the office in which the patient attends annual eye exams? Howard McFarland, OD. If pt is not established with a provider, would they like to be referred to a provider to establish care? No .   Dental Screening: Recommended annual dental exams for proper oral hygiene  Community Resource Referral / Chronic Care Management: CRR required this visit?  No   CCM required this visit?  No      Plan:     I have personally reviewed and noted the following in the patient's chart:   Medical and social history Use of alcohol, tobacco or illicit drugs  Current medications and supplements including opioid prescriptions. Patient is  not currently taking opioid prescriptions. Functional ability and status Nutritional status Physical activity Advanced directives List of other physicians Hospitalizations, surgeries, and ER visits in previous 12 months Vitals Screenings to include cognitive, depression, and falls Referrals and appointments  In addition, I have reviewed and discussed with patient certain preventive protocols, quality metrics, and best practice recommendations. A written personalized care plan for preventive services as well as general preventive health recommendations were provided to patient.     Sheral Flow, LPN   [id number]   Nurse Notes: N/A

## 2022-03-19 NOTE — Patient Instructions (Signed)
[name] , Thank you for taking time to come for your Medicare Wellness Visit. I appreciate your ongoing commitment to your health goals. Please review the following plan we discussed and let me know if I can assist you in the future.   These are the goals we discussed:  Goals      Client understands the importance of follow-up with providers by attending scheduled visits        This is a list of the screening recommended for you and due dates:  Health Maintenance  Topic Date Due   DTaP/Tdap/Td vaccine (3 - Tdap) 08/31/2019   DEXA scan (bone density measurement)  Never done   Pneumonia Vaccine (3 - PPSV23 or PCV20) 04/24/2022   Mammogram  03/22/2022   Hemoglobin A1C  04/08/2022   Eye exam for diabetics  05/10/2022   Yearly kidney health urinalysis for diabetes  05/17/2022   Complete foot exam   05/17/2022   Yearly kidney function blood test for diabetes  01/03/2023   Medicare Annual Wellness Visit  03/20/2023   Colon Cancer Screening  11/28/2026   Flu Shot  Completed   Hepatitis C Screening: USPSTF Recommendation to screen - Ages 18-79 yo.  Completed   HIV Screening  Completed   HPV Vaccine  Aged Out   COVID-19 Vaccine  Discontinued   Zoster (Shingles) Vaccine  Discontinued    Advanced directives: Yes; Please bring a copy of your health care power of attorney and living will to the office at your convenience.  Conditions/risks identified: Yes  Next appointment: Follow up in one year for your annual wellness visit.   Preventive Care 32 Years and Older, Female Preventive care refers to lifestyle choices and visits with your health care provider that can promote health and wellness. What does preventive care include? A yearly physical exam. This is also called an annual well check. Dental exams once or twice a year. Routine eye exams. Ask your health care provider how often you should have your eyes checked. Personal lifestyle choices, including: Daily care of your teeth  and gums. Regular physical activity. Eating a healthy diet. Avoiding tobacco and drug use. Limiting alcohol use. Practicing safe sex. Taking low-dose aspirin every day. Taking vitamin and mineral supplements as recommended by your health care provider. What happens during an annual well check? The services and screenings done by your health care provider during your annual well check will depend on your age, overall health, lifestyle risk factors, and family history of disease. Counseling  Your health care provider may ask you questions about your: Alcohol use. Tobacco use. Drug use. Emotional well-being. Home and relationship well-being. Sexual activity. Eating habits. History of falls. Memory and ability to understand (cognition). Work and work Statistician. Reproductive health. Screening  You may have the following tests or measurements: Height, weight, and BMI. Blood pressure. Lipid and cholesterol levels. These may be checked every 5 years, or more frequently if you are over 64 years old. Skin check. Lung cancer screening. You may have this screening every year starting at age 85 if you have a 30-pack-year history of smoking and currently smoke or have quit within the past 15 years. Fecal occult blood test (FOBT) of the stool. You may have this test every year starting at age 16. Flexible sigmoidoscopy or colonoscopy. You may have a sigmoidoscopy every 5 years or a colonoscopy every 10 years starting at age 60. Hepatitis C blood test. Hepatitis B blood test. Sexually transmitted disease (STD) testing. Diabetes  screening. This is done by checking your blood sugar (glucose) after you have not eaten for a while (fasting). You may have this done every 1-3 years. Bone density scan. This is done to screen for osteoporosis. You may have this done starting at age 32. Mammogram. This may be done every 1-2 years. Talk to your health care provider about how often you should have regular  mammograms. Talk with your health care provider about your test results, treatment options, and if necessary, the need for more tests. Vaccines  Your health care provider may recommend certain vaccines, such as: Influenza vaccine. This is recommended every year. Tetanus, diphtheria, and acellular pertussis (Tdap, Td) vaccine. You may need a Td booster every 10 years. Zoster vaccine. You may need this after age 1. Pneumococcal 13-valent conjugate (PCV13) vaccine. One dose is recommended after age 78. Pneumococcal polysaccharide (PPSV23) vaccine. One dose is recommended after age 84. Talk to your health care provider about which screenings and vaccines you need and how often you need them. This information is not intended to replace advice given to you by your health care provider. Make sure you discuss any questions you have with your health care provider. Document Released: 03/04/2015 Document Revised: 10/26/2015 Document Reviewed: 12/07/2014 Elsevier Interactive Patient Education  2017 [place] Prevention in the Home Falls can cause injuries. They can happen to people of all ages. There are many things you can do to make your home safe and to help prevent falls. What can I do on the outside of my home? Regularly fix the edges of walkways and driveways and fix any cracks. Remove anything that might make you trip as you walk through a door, such as a raised step or threshold. Trim any bushes or trees on the path to your home. Use bright outdoor lighting. Clear any walking paths of anything that might make someone trip, such as rocks or tools. Regularly check to see if handrails are loose or broken. Make sure that both sides of any steps have handrails. Any raised decks and porches should have guardrails on the edges. Have any leaves, snow, or ice cleared regularly. Use sand or salt on walking paths during winter. Clean up any spills in your garage right away. This includes oil  or grease spills. What can I do in the bathroom? Use night lights. Install grab bars by the toilet and in the tub and shower. Do not use towel bars as grab bars. Use non-skid mats or decals in the tub or shower. If you need to sit down in the shower, use a plastic, non-slip stool. Keep the floor dry. Clean up any water that spills on the floor as soon as it happens. Remove soap buildup in the tub or shower regularly. Attach bath mats securely with double-sided non-slip rug tape. Do not have throw rugs and other things on the floor that can make you trip. What can I do in the bedroom? Use night lights. Make sure that you have a light by your bed that is easy to reach. Do not use any sheets or blankets that are too big for your bed. They should not hang down onto the floor. Have a firm chair that has side arms. You can use this for support while you get dressed. Do not have throw rugs and other things on the floor that can make you trip. What can I do in the kitchen? Clean up any spills right away. Avoid walking on wet floors. Keep  items that you use a lot in easy-to-reach places. If you need to reach something above you, use a strong step stool that has a grab bar. Keep electrical cords out of the way. Do not use floor polish or wax that makes floors slippery. If you must use wax, use non-skid floor wax. Do not have throw rugs and other things on the floor that can make you trip. What can I do with my stairs? Do not leave any items on the stairs. Make sure that there are handrails on both sides of the stairs and use them. Fix handrails that are broken or loose. Make sure that handrails are as long as the stairways. Check any carpeting to make sure that it is firmly attached to the stairs. Fix any carpet that is loose or worn. Avoid having throw rugs at the top or bottom of the stairs. If you do have throw rugs, attach them to the floor with carpet tape. Make sure that you have a light  switch at the top of the stairs and the bottom of the stairs. If you do not have them, ask someone to add them for you. What else can I do to help prevent falls? Wear shoes that: Do not have high heels. Have rubber bottoms. Are comfortable and fit you well. Are closed at the toe. Do not wear sandals. If you use a stepladder: Make sure that it is fully opened. Do not climb a closed stepladder. Make sure that both sides of the stepladder are locked into place. Ask someone to hold it for you, if possible. Clearly mark and make sure that you can see: Any grab bars or handrails. First and last steps. Where the edge of each step is. Use tools that help you move around (mobility aids) if they are needed. These include: Canes. Walkers. Scooters. Crutches. Turn on the lights when you go into a dark area. Replace any light bulbs as soon as they burn out. Set up your furniture so you have a clear path. Avoid moving your furniture around. If any of your floors are uneven, fix them. If there are any pets around you, be aware of where they are. Review your medicines with your doctor. Some medicines can make you feel dizzy. This can increase your chance of falling. Ask your doctor what other things that you can do to help prevent falls. This information is not intended to replace advice given to you by your health care provider. Make sure you discuss any questions you have with your health care provider. Document Released: 12/02/2008 Document Revised: 07/14/2015 Document Reviewed: 03/12/2014 Elsevier Interactive Patient Education  2017 [employer].

## 2022-03-26 DIAGNOSIS — I10 Essential (primary) hypertension: Secondary | ICD-10-CM | POA: Diagnosis not present

## 2022-04-03 DIAGNOSIS — M25552 Pain in left hip: Secondary | ICD-10-CM | POA: Diagnosis not present

## 2022-04-04 ENCOUNTER — Other Ambulatory Visit: Payer: Self-pay | Admitting: Physician Assistant

## 2022-04-04 DIAGNOSIS — M5416 Radiculopathy, lumbar region: Secondary | ICD-10-CM

## 2022-04-04 DIAGNOSIS — M545 Low back pain, unspecified: Secondary | ICD-10-CM | POA: Diagnosis not present

## 2022-04-09 ENCOUNTER — Encounter: Payer: Self-pay | Admitting: Internal Medicine

## 2022-04-09 ENCOUNTER — Other Ambulatory Visit: Payer: Self-pay | Admitting: Internal Medicine

## 2022-04-09 ENCOUNTER — Ambulatory Visit (INDEPENDENT_AMBULATORY_CARE_PROVIDER_SITE_OTHER): Payer: Medicare Other | Admitting: Internal Medicine

## 2022-04-09 VITALS — BP 130/78 | HR 60 | Temp 97.6°F | Ht 62.0 in | Wt 187.0 lb

## 2022-04-09 DIAGNOSIS — E538 Deficiency of other specified B group vitamins: Secondary | ICD-10-CM

## 2022-04-09 DIAGNOSIS — I10 Essential (primary) hypertension: Secondary | ICD-10-CM

## 2022-04-09 DIAGNOSIS — N390 Urinary tract infection, site not specified: Secondary | ICD-10-CM

## 2022-04-09 DIAGNOSIS — I700 Atherosclerosis of aorta: Secondary | ICD-10-CM

## 2022-04-09 DIAGNOSIS — E1169 Type 2 diabetes mellitus with other specified complication: Secondary | ICD-10-CM | POA: Diagnosis not present

## 2022-04-09 DIAGNOSIS — E782 Mixed hyperlipidemia: Secondary | ICD-10-CM

## 2022-04-09 DIAGNOSIS — E559 Vitamin D deficiency, unspecified: Secondary | ICD-10-CM

## 2022-04-09 DIAGNOSIS — R490 Dysphonia: Secondary | ICD-10-CM

## 2022-04-09 DIAGNOSIS — N184 Chronic kidney disease, stage 4 (severe): Secondary | ICD-10-CM

## 2022-04-09 DIAGNOSIS — Z0001 Encounter for general adult medical examination with abnormal findings: Secondary | ICD-10-CM | POA: Diagnosis not present

## 2022-04-09 DIAGNOSIS — E669 Obesity, unspecified: Secondary | ICD-10-CM

## 2022-04-09 LAB — URINALYSIS, ROUTINE W REFLEX MICROSCOPIC
Bilirubin Urine: NEGATIVE
Hgb urine dipstick: NEGATIVE
Ketones, ur: NEGATIVE
Leukocytes,Ua: NEGATIVE
Nitrite: NEGATIVE
RBC / HPF: NONE SEEN (ref 0–?)
Specific Gravity, Urine: 1.02 (ref 1.000–1.030)
Total Protein, Urine: NEGATIVE
Urine Glucose: NEGATIVE
Urobilinogen, UA: 0.2 (ref 0.0–1.0)
pH: 6 (ref 5.0–8.0)

## 2022-04-09 LAB — HEPATIC FUNCTION PANEL
ALT: 14 U/L (ref 0–35)
AST: 12 U/L (ref 0–37)
Albumin: 4.4 g/dL (ref 3.5–5.2)
Alkaline Phosphatase: 60 U/L (ref 39–117)
Bilirubin, Direct: 0.1 mg/dL (ref 0.0–0.3)
Total Bilirubin: 0.5 mg/dL (ref 0.2–1.2)
Total Protein: 6.9 g/dL (ref 6.0–8.3)

## 2022-04-09 LAB — CBC WITH DIFFERENTIAL/PLATELET
Basophils Absolute: 0 10*3/uL (ref 0.0–0.1)
Basophils Relative: 0.3 % (ref 0.0–3.0)
Eosinophils Absolute: 0.2 10*3/uL (ref 0.0–0.7)
Eosinophils Relative: 1.7 % (ref 0.0–5.0)
HCT: 40 % (ref 36.0–46.0)
Hemoglobin: 13.7 g/dL (ref 12.0–15.0)
Lymphocytes Relative: 17.4 % (ref 12.0–46.0)
Lymphs Abs: 1.7 10*3/uL (ref 0.7–4.0)
MCHC: 34.3 g/dL (ref 30.0–36.0)
MCV: 91.9 fl (ref 78.0–100.0)
Monocytes Absolute: 0.6 10*3/uL (ref 0.1–1.0)
Monocytes Relative: 6.7 % (ref 3.0–12.0)
Neutro Abs: 7.1 10*3/uL (ref 1.4–7.7)
Neutrophils Relative %: 73.9 % (ref 43.0–77.0)
Platelets: 261 10*3/uL (ref 150.0–400.0)
RBC: 4.35 Mil/uL (ref 3.87–5.11)
RDW: 14.2 % (ref 11.5–15.5)
WBC: 9.6 10*3/uL (ref 4.0–10.5)

## 2022-04-09 LAB — LIPID PANEL
Cholesterol: 156 mg/dL (ref 0–200)
HDL: 47.8 mg/dL (ref >39.00)
LDL Cholesterol: 89 mg/dL (ref 0–99)
NonHDL: 108.02
Total CHOL/HDL Ratio: 3
Triglycerides: 97 mg/dL (ref 0.0–149.0)
VLDL: 19.4 mg/dL (ref 0.0–40.0)

## 2022-04-09 LAB — BASIC METABOLIC PANEL
BUN: 33 mg/dL — ABNORMAL HIGH (ref 6–23)
CO2: 24 mEq/L (ref 19–32)
Calcium: 9.4 mg/dL (ref 8.4–10.5)
Chloride: 107 mEq/L (ref 96–112)
Creatinine, Ser: 1.74 mg/dL — ABNORMAL HIGH (ref 0.40–1.20)
GFR: 30.44 mL/min — ABNORMAL LOW (ref >60.00)
Glucose, Bld: 117 mg/dL — ABNORMAL HIGH (ref 70–99)
Potassium: 4.1 mEq/L (ref 3.5–5.1)
Sodium: 142 mEq/L (ref 135–145)

## 2022-04-09 LAB — VITAMIN D 25 HYDROXY (VIT D DEFICIENCY, FRACTURES): VITD: 43.74 ng/mL (ref 30.00–100.00)

## 2022-04-09 LAB — MICROALBUMIN / CREATININE URINE RATIO
Creatinine,U: 97.9 mg/dL
Microalb Creat Ratio: 7.1 mg/g (ref 0.0–30.0)
Microalb, Ur: 7 mg/dL — ABNORMAL HIGH (ref 0.0–1.9)

## 2022-04-09 LAB — VITAMIN B12: Vitamin B-12: 175 pg/mL — ABNORMAL LOW (ref 211–911)

## 2022-04-09 LAB — TSH: TSH: 6.71 u[IU]/mL — ABNORMAL HIGH (ref 0.35–5.50)

## 2022-04-09 LAB — HEMOGLOBIN A1C: Hgb A1c MFr Bld: 6.7 % — ABNORMAL HIGH (ref 4.6–6.5)

## 2022-04-09 MED ORDER — EMPAGLIFLOZIN 25 MG PO TABS: 25.0000 mg | ORAL_TABLET | Freq: Every day | ORAL | 3 refills | Status: DC

## 2022-04-09 NOTE — Patient Instructions (Addendum)
Please have your Shingrix (shingles) shots done at your local pharmacy.  Please continue all other medications as before, and refills have been done if requested.  Please have the pharmacy call with any other refills you may need.  Please continue your efforts at being more active, low cholesterol diet, and weight control.  You are otherwise up to date with prevention measures today.  Please keep your appointments with your specialists as you may have planned - MRI for the lower back, kidney doctor and urology soon  Please go to the LAB at the blood drawing area for the tests to be done, including the urine culture  You will be contacted by phone if any changes need to be made immediately.  Otherwise, you will receive a letter about your results with an explanation, but please check with MyChart first.  Please remember to sign up for MyChart if you have not done so, as this will be important to you in the future with finding out test results, communicating by private email, and scheduling acute appointments online when needed.  Please make an Appointment to return in 6 months, or sooner if needed, also with Lab Appointment for testing done 3-5 days before at the [facility] (so this is for TWO appointments - please see the scheduling desk as you leave)

## 2022-04-09 NOTE — Progress Notes (Signed)
Patient ID: Heather Dawson, female   DOB: [birth year], 67 y.o.   MRN: [id number]         Chief Complaint:: wellness exam and f/u uti, aortic atherosclerosis, low B12, Ckd 3a, dm, htn,  hld       HPI:  Heather Dawson is a 67 y.o. female here for wellness exam; for shignrix and tdap at pharmacy, plans to f/u with GYN for mammogram, dxa; o/w up to date                        Also did see office NP with recent uti, now with improved dysuria  Has f/u renal later this mo, and urolo.gujgy apr 3 for recurrent uti.  Had recent cortisone that helped greatly to left lower back, and now for MRI later this Monday.  Pt denies chest pain, increased sob or doe, wheezing, orthopnea, PND, increased LE swelling, palpitations, dizziness or syncope.   Pt denies polydipsia, polyuria, or new focal neuro s/s.    Pt denies fever, wt loss, night sweats, loss of appetite, or other constitutional symptoms     Wt Readings from Last 3 Encounters:  04/09/22 187 lb (84.8 kg)  03/19/22 185 lb (83.9 kg)  02/14/22 185 lb (83.9 kg)   BP Readings from Last 3 Encounters:  04/09/22 130/78  02/14/22 120/68  01/09/22 (!) 160/88   Immunization History  Administered Date(s) Administered   Fluad Quad(high Dose 65+) 12/05/2021   Influenza Split 01/03/2011   Influenza Whole 12/28/2005, 01/07/2007, 11/18/2007, 11/18/2008   Influenza,inj,Quad PF,6+ Mos 12/30/2012, 11/06/2013, 11/10/2014, 12/20/2015, 11/13/2016, 10/24/2017, 10/31/2018, 03/23/2020, 12/27/2020   Influenza-Unspecified 10/29/2018, 11/19/2020   PFIZER Comirnaty(Gray Top)Covid-19 Tri-Sucrose Vaccine 07/03/2019, 07/24/2019, 02/04/2020   PFIZER(Purple Top)SARS-COV-2 Vaccination 07/03/2019, 07/24/2019, 02/04/2020   Pneumococcal Conjugate-13 05/03/2016   Pneumococcal Polysaccharide-23 04/23/2017   Td 08/30/2009   Td (Adult), 2 Lf Tetanus Toxid, Preservative Free 08/30/2009   Zoster Recombinat (Shingrix) 04/23/2017   Health Maintenance Due  Topic Date Due   DTaP/Tdap/Td (3 -  Tdap) 08/31/2019   Pneumonia Vaccine 28+ Years old (42 of 3 - PPSV23 or PCV20) 04/24/2022      Past Medical History:  Diagnosis Date   Abdominal pain, epigastric 12/20/2006   ALLERGIC RHINITIS 12/25/2006   Allergy    ANXIETY 10/16/2006   no per pt   Arthritis    right shoulder   BACK PAIN 06/15/2008   Blood transfusion without reported diagnosis    BRADYCARDIA 05/04/2008   C. difficile diarrhea    Carpal tunnel syndrome 10/16/2006   Cellulitis and abscess of other specified site 06/15/2008   CHEST PAIN 09/08/2009   Chronic low back pain    CIRRHOSIS 10/16/2006   CKD (chronic kidney disease)    2008- had Ecoli and caused renal failure, no problems since then   CKD (chronic kidney disease) stage 3, GFR 30-59 ml/min ([place]) 10/16/2006   Qualifier: Diagnosis of  By: Jenny Reichmann MD, [place] [id number]   Complication of anesthesia    awake during 2 surgeries   CONTUSION, LOWER LEG 09/15/2008   DEPRESSION 10/16/2006   no per pt   Diabetes mellitus without complication ([place])    Esophageal reflux 12/25/2006   HYPERLIPIDEMIA 12/25/2006   Hyperlipidemia    HYPERTENSION 08/15/2006   Hypertension    HYPOTHYROIDISM 08/15/2006   Kidney failure, acute ([facility]) 2009   as a result of severe E Coli infection   Left-sided carotid artery disease ([place])  MIGRAINE HEADACHE 10/16/2006   Obesity 10/16/2006   OVARIAN CYST 12/20/2006   Psoriasis 01/03/2011   SHINGLES, HX OF 12/20/2006   Stroke ([place])    3 strokes 2008-no deficits, only on ASA   TRANSIENT ISCHEMIC ATTACKS, HX OF 12/20/2006   VERTIGO 09/15/2007   VITAMIN B12 DEFICIENCY 01/09/2007   Past Surgical History:  Procedure Laterality Date   ABDOMINAL HYSTERECTOMY  02/2006   ANTERIOR CERVICAL DECOMP/DISCECTOMY FUSION  2005   BIOPSY  11/27/2021   Procedure: BIOPSY;  Surgeon: Yetta Flock, MD;  Location: [facility];  Service: Gastroenterology;;   CESAREAN SECTION     CHOLECYSTECTOMY     COLONOSCOPY     COLONOSCOPY WITH PROPOFOL  N/A 11/27/2021   Procedure: COLONOSCOPY WITH PROPOFOL;  Surgeon: Yetta Flock, MD;  Location: [facility];  Service: Gastroenterology;  Laterality: N/A;   ENDARTERECTOMY Left 08/25/2014   Procedure: LEFT CAROTID ENDARTERECTOMY WITH HEMASHIELD PATCH ANGIOPLASTY;  Surgeon: Elam Dutch, MD;  Location: [facility];  Service: Vascular;  Laterality: Left;   ESOPHAGOGASTRODUODENOSCOPY (EGD) WITH PROPOFOL N/A 11/27/2021   Procedure: ESOPHAGOGASTRODUODENOSCOPY (EGD) WITH PROPOFOL;  Surgeon: Yetta Flock, MD;  Location: [facility];  Service: Gastroenterology;  Laterality: N/A;   NECK SURGERY  2005   ROTATOR CUFF REPAIR Right 02/2014   Dr Veverly Fells    reports that she has quit smoking. She has never used smokeless tobacco. She reports that she does not drink alcohol and does not use drugs. family history includes Diabetes in her father, sister, and another family member; Heart disease in her brother, brother, father, and mother; Hypertension in her brother, brother and another family member; Lupus in her mother; Raynaud syndrome in her mother; Stroke in her father. Allergies  Allergen Reactions   Macrodantin [Nitrofurantoin Macrocrystal] Shortness Of Breath, Diarrhea and Other (See Comments)    GI upset   Adipex-P [Phentermine] Other (See Comments)    "All the side effects listed on the paper"   Asa [Aspirin] Other (See Comments)    Told to avoid due to kidney function   Bactrim [Sulfamethoxazole-Trimethoprim] Nausea And Vomiting   Cipro [Ciprofloxacin Hcl] Swelling   Levaquin [Levofloxacin In D5w] Other (See Comments)    GI upset   Lipitor [Atorvastatin] Other (See Comments)    Myalgia    Lyrica [Pregabalin] Swelling and Other (See Comments)    Headaches Confusion   Nsaids Other (See Comments)    Told to avoid due to kidney function   Vibramycin [Doxycycline] Nausea Only   Zinacef [Cefuroxime] Swelling   Neurontin [Gabapentin] Rash   Current Outpatient Medications on File Prior to  Visit  Medication Sig Dispense Refill   acetaminophen (TYLENOL) 500 MG tablet Take 1,000 mg by mouth every 8 (eight) hours as needed (pain).     amLODipine (NORVASC) 10 MG tablet 1 tab by mouth once daily (Patient taking differently: Take 10 mg by mouth daily.) 90 tablet 3   Ascorbic Acid (VITAMIN C) 1000 MG tablet Take 1,000 mg by mouth daily.     aspirin EC 81 MG tablet Take 81 mg by mouth daily.     carvedilol (COREG) 3.125 MG tablet Take 3.125 mg by mouth 2 (two) times daily.     Cholecalciferol (VITAMIN D3) 1000 units CAPS Take 2 capsules (2,000 Units total) by mouth daily. 60 capsule 0   clobetasol (TEMOVATE) 0.05 % external solution Apply 1 application topically daily as needed (scalp psoriasis).   0   diazepam (VALIUM) 5 MG tablet Take 5 mg by  mouth 3 (three) times daily as needed.     estradiol (ESTRACE) 0.1 MG/GM vaginal cream Place 1 Applicatorful vaginally See admin instructions. Apply to vagina twice weekly on Monday, Thursday.     famotidine (PEPCID) 20 MG tablet Take 1 tablet (20 mg total) by mouth 2 (two) times daily. 180 tablet 3   hydrALAZINE (APRESOLINE) 100 MG tablet Take 1 tablet (100 mg total) by mouth 3 (three) times daily. 90 tablet 11   levocetirizine (XYZAL) 5 MG tablet Take 5 mg by mouth every evening.     levothyroxine (SYNTHROID) 75 MCG tablet Take 1 tablet (75 mcg total) by mouth daily. 90 tablet 3   lovastatin (MEVACOR) 40 MG tablet Take 2 tablets (80 mg total) by mouth at bedtime. 180 tablet 3   olmesartan (BENICAR) 5 MG tablet Take 5 mg by mouth daily.     pantoprazole (PROTONIX) 40 MG tablet Take 1 tablet (40 mg total) by mouth 2 (two) times daily before a meal. (Patient taking differently: Take 40 mg by mouth 2 (two) times daily.) 180 tablet 3   predniSONE (DELTASONE) 20 MG tablet Take 2 tablets (40 mg total) by mouth daily with breakfast. 10 tablet 0   triamcinolone cream (KENALOG) 0.1 % Apply 1 application  topically daily as needed (psoriasis).     TURMERIC  CURCUMIN PO Take 1 tablet by mouth daily.     No current facility-administered medications on file prior to visit.        ROS:  All others reviewed and negative.  Objective        PE:  BP 130/78 (BP Location: Left Arm, Patient Position: Sitting, Cuff Size: Large)   Pulse 60   Temp 97.6 F (36.4 C) (Oral)   Ht '5\' 2"'$  (1.575 m)   Wt 187 lb (84.8 kg)   SpO2 96%   BMI 34.20 kg/m                 Constitutional: Pt appears in NAD               HENT: Head: NCAT.                Right Ear: External ear normal.                 Left Ear: External ear normal.                Eyes: . Pupils are equal, round, and reactive to light. Conjunctivae and EOM are normal               Nose: without d/c or deformity               Neck: Neck supple. Gross normal ROM               Cardiovascular: Normal rate and regular rhythm.                 Pulmonary/Chest: Effort normal and breath sounds without rales or wheezing.                Abd:  Soft, NT, ND, + BS, no organomegaly               Neurological: Pt is alert. At baseline orientation, motor grossly intact               Skin: Skin is warm. No rashes, no other new lesions, LE edema - trace bilateral  Psychiatric: Pt behavior is normal without agitation   Micro: none  Cardiac tracings I have personally interpreted today:  none  Pertinent Radiological findings (summarize): none   Lab Results  Component Value Date   WBC 9.6 04/09/2022   HGB 13.7 04/09/2022   HCT 40.0 04/09/2022   PLT 261.0 04/09/2022   GLUCOSE 117 (H) 04/09/2022   CHOL 156 04/09/2022   TRIG 97.0 04/09/2022   HDL 47.80 04/09/2022   LDLDIRECT 107.0 10/06/2021   LDLCALC 89 04/09/2022   ALT 14 04/09/2022   AST 12 04/09/2022   NA 142 04/09/2022   K 4.1 04/09/2022   CL 107 04/09/2022   CREATININE 1.74 (H) 04/09/2022   BUN 33 (H) 04/09/2022   CO2 24 04/09/2022   TSH 6.71 (H) 04/09/2022   INR 0.99 08/17/2014   HGBA1C 6.7 (H) 04/09/2022   MICROALBUR 7.0 (H)  04/09/2022   Assessment/Plan:  Ahlana Rott Hashman is a 67 y.o. White or Caucasian [1] female with  has a past medical history of Abdominal pain, epigastric (12/20/2006), ALLERGIC RHINITIS (12/25/2006), Allergy, ANXIETY (10/16/2006), Arthritis, BACK PAIN (06/15/2008), Blood transfusion without reported diagnosis, BRADYCARDIA (05/04/2008), C. difficile diarrhea, Carpal tunnel syndrome (10/16/2006), Cellulitis and abscess of other specified site (06/15/2008), CHEST PAIN (09/08/2009), Chronic low back pain, CIRRHOSIS (10/16/2006), CKD (chronic kidney disease), CKD (chronic kidney disease) stage 3, GFR 30-59 ml/min ([facility]) (10/16/2006), COMMON MIGRAINE (99991111), Complication of anesthesia, CONTUSION, LOWER LEG (09/15/2008), DEPRESSION (10/16/2006), Diabetes mellitus without complication ([place]), Esophageal reflux (12/25/2006), HYPERLIPIDEMIA (12/25/2006), Hyperlipidemia, HYPERTENSION (08/15/2006), Hypertension, HYPOTHYROIDISM (08/15/2006), Kidney failure, acute ([place]) (2009), Left-sided carotid artery disease ([place]), MIGRAINE HEADACHE (10/16/2006), Obesity (10/16/2006), OVARIAN CYST (12/20/2006), Psoriasis (01/03/2011), SHINGLES, HX OF (12/20/2006), Stroke ([place]), TRANSIENT ISCHEMIC ATTACKS, HX OF (12/20/2006), VERTIGO (09/15/2007), and VITAMIN B12 DEFICIENCY (01/09/2007).  Aortic atherosclerosis ([facility]) Pt to continue low chold diet, exercise, lovastatin 40 mg per day  Encounter for well adult exam with abnormal findings Age and sex appropriate education and counseling updated with regular exercise and diet Referrals for preventative services d - pt to fu with GYN for mammograma nd dxa Immunizations addressed - for shingrix and tdap at pharmacy Smoking counseling  - none needed Evidence for depression or other mood disorder - none significant Most recent labs reviewed. I have personally reviewed and have noted: 1) the patient's medical and social history 2) The patient's current medications and supplements 3) The patient's height,  weight, and BMI have been recorded in the chart   B12 deficiency Lab Results  Component Value Date   VITAMINB12 175 (L) 04/09/2022   Low, to start oral replacement - b12 1000 mcg qd   CKD (chronic kidney disease) stage 4, GFR 15-29 ml/min ([facility]) Lab Results  Component Value Date   CREATININE 1.74 (H) 04/09/2022   Stable overall, cont to avoid nephrotoxins, f/u renal as planned  Diabetes mellitus type 2 in obese [facility]) Lab Results  Component Value Date   HGBA1C 6.7 (H) 04/09/2022   Mild uncontrolled with A1c ,ess than 7, pt to continue current medical treatment glucotrl xl 2.5 mg qd   Essential hypertension BP Readings from Last 3 Encounters:  04/09/22 130/78  02/14/22 120/68  01/09/22 (!) 160/88   Stable, pt to continue medical treatment norvassc 10 mg qd, coreg 3.125 mg bid, hydralazine 100 tid   Hyperlipidemia Lab Results  Component Value Date   LDLCALC 89 04/09/2022   Uncontrolled, goal ldl < 7- e, pt to continue current statin lovastatin 80 mg qd and for lower chol diet, declines  other change in tx at this time   UTI (urinary tract infection) Recent onset, exam bening, pt to finish antibx,  to f/u any worsening symptoms or concerns    Followup: Return in about 6 months (around 10/08/2022).  [name], MD 04/14/2022 1:36 PM [facility]

## 2022-04-10 ENCOUNTER — Encounter: Payer: Self-pay | Admitting: Internal Medicine

## 2022-04-10 DIAGNOSIS — Z6834 Body mass index (BMI) 34.0-34.9, adult: Secondary | ICD-10-CM | POA: Diagnosis not present

## 2022-04-10 DIAGNOSIS — Z01419 Encounter for gynecological examination (general) (routine) without abnormal findings: Secondary | ICD-10-CM | POA: Diagnosis not present

## 2022-04-10 DIAGNOSIS — Z1231 Encounter for screening mammogram for malignant neoplasm of breast: Secondary | ICD-10-CM | POA: Diagnosis not present

## 2022-04-10 LAB — URINE CULTURE

## 2022-04-10 MED ORDER — GLIPIZIDE ER 2.5 MG PO TB24: 2.5000 mg | ORAL_TABLET | Freq: Every day | ORAL | 3 refills | Status: DC

## 2022-04-14 NOTE — Assessment & Plan Note (Signed)
Lab Results  Component Value Date   VITAMINB12 175 (L) 04/09/2022   Low, to start oral replacement - b12 1000 mcg qd

## 2022-04-14 NOTE — Assessment & Plan Note (Signed)
Recent onset, exam bening, pt to finish antibx,  to f/u any worsening symptoms or concerns

## 2022-04-14 NOTE — Assessment & Plan Note (Signed)
Lab Results  Component Value Date   CREATININE 1.74 (H) 04/09/2022   Stable overall, cont to avoid nephrotoxins, f/u renal as planned

## 2022-04-14 NOTE — Assessment & Plan Note (Signed)
Lab Results  Component Value Date   HGBA1C 6.7 (H) 04/09/2022   Mild uncontrolled with A1c ,ess than 7, pt to continue current medical treatment glucotrl xl 2.5 mg qd

## 2022-04-14 NOTE — Assessment & Plan Note (Addendum)
Age and sex appropriate education and counseling updated with regular exercise and diet Referrals for preventative services d - pt to fu with GYN for mammograma nd dxa Immunizations addressed - for shingrix and tdap at pharmacy Smoking counseling  - none needed Evidence for depression or other mood disorder - none significant Most recent labs reviewed. I have personally reviewed and have noted: 1) the patient's medical and social history 2) The patient's current medications and supplements 3) The patient's height, weight, and BMI have been recorded in the chart

## 2022-04-14 NOTE — Assessment & Plan Note (Signed)
Lab Results  Component Value Date   LDLCALC 89 04/09/2022   Uncontrolled, goal ldl < 7- e, pt to continue current statin lovastatin 80 mg qd and for lower chol diet, declines other change in tx at this time

## 2022-04-14 NOTE — Assessment & Plan Note (Signed)
BP Readings from Last 3 Encounters:  04/09/22 130/78  02/14/22 120/68  01/09/22 (!) 160/88   Stable, pt to continue medical treatment norvassc 10 mg qd, coreg 3.125 mg bid, hydralazine 100 tid

## 2022-04-14 NOTE — Assessment & Plan Note (Signed)
Pt to continue low chold diet, exercise, lovastatin 40 mg per day

## 2022-04-19 ENCOUNTER — Ambulatory Visit
Admission: RE | Admit: 2022-04-19 | Discharge: 2022-04-19 | Disposition: A | Discharge status: Home / Self Care | Payer: Medicare Other | Source: Ambulatory Visit | Attending: Physician Assistant | Admitting: Physician Assistant

## 2022-04-19 DIAGNOSIS — M5416 Radiculopathy, lumbar region: Secondary | ICD-10-CM

## 2022-04-19 DIAGNOSIS — M5136 Other intervertebral disc degeneration, lumbar region: Secondary | ICD-10-CM | POA: Diagnosis not present

## 2022-04-20 DIAGNOSIS — K080 Exfoliation of teeth due to systemic causes: Secondary | ICD-10-CM | POA: Diagnosis not present

## 2022-04-25 DIAGNOSIS — M545 Low back pain, unspecified: Secondary | ICD-10-CM | POA: Diagnosis not present

## 2022-04-26 ENCOUNTER — Encounter: Payer: Self-pay | Admitting: Internal Medicine

## 2022-04-30 ENCOUNTER — Telehealth: Payer: Self-pay | Admitting: Internal Medicine

## 2022-04-30 NOTE — Telephone Encounter (Signed)
Patient needs a script for the dexcom G6 called in so she can check her sugar.  Insurance said it does not need prior approval.  Please send to:  [facility] on [address] - It has to be G6 for them to approve not the [place]

## 2022-05-01 MED ORDER — DEXCOM G6 SENSOR MISC: 3 refills | Status: DC

## 2022-05-01 MED ORDER — DEXCOM G6 RECEIVER DEVI: 0 refills | Status: DC

## 2022-05-01 NOTE — Telephone Encounter (Signed)
Sent Dexcom G6 to [facility].Marland KitchenJohny Chess

## 2022-05-02 ENCOUNTER — Encounter: Payer: Self-pay | Admitting: Internal Medicine

## 2022-05-03 MED ORDER — HYDROCOD POLI-CHLORPHE POLI ER 10-8 MG/5ML PO SUER: 5.0000 mL | Freq: Two times a day (BID) | ORAL | 0 refills | Status: DC | PRN

## 2022-05-03 NOTE — Telephone Encounter (Signed)
LOV 04/09/22, patient requesting Rx for cough, please advise

## 2022-05-04 ENCOUNTER — Ambulatory Visit (INDEPENDENT_AMBULATORY_CARE_PROVIDER_SITE_OTHER): Payer: Medicare Other | Admitting: Internal Medicine

## 2022-05-04 ENCOUNTER — Ambulatory Visit (INDEPENDENT_AMBULATORY_CARE_PROVIDER_SITE_OTHER): Payer: Medicare Other

## 2022-05-04 VITALS — BP 134/82 | HR 65 | Temp 98.8°F | Ht 62.0 in | Wt 188.0 lb

## 2022-05-04 DIAGNOSIS — E1169 Type 2 diabetes mellitus with other specified complication: Secondary | ICD-10-CM

## 2022-05-04 DIAGNOSIS — I10 Essential (primary) hypertension: Secondary | ICD-10-CM

## 2022-05-04 DIAGNOSIS — R051 Acute cough: Secondary | ICD-10-CM

## 2022-05-04 DIAGNOSIS — E669 Obesity, unspecified: Secondary | ICD-10-CM

## 2022-05-04 DIAGNOSIS — R059 Cough, unspecified: Secondary | ICD-10-CM | POA: Diagnosis not present

## 2022-05-04 DIAGNOSIS — R6889 Other general symptoms and signs: Secondary | ICD-10-CM

## 2022-05-04 DIAGNOSIS — J019 Acute sinusitis, unspecified: Secondary | ICD-10-CM

## 2022-05-04 DIAGNOSIS — N1832 Chronic kidney disease, stage 3b: Secondary | ICD-10-CM

## 2022-05-04 LAB — POC INFLUENZA A&B (BINAX/QUICKVUE)
Influenza A, POC: NEGATIVE
Influenza B, POC: NEGATIVE

## 2022-05-04 LAB — POCT RESPIRATORY SYNCYTIAL VIRUS: RSV Rapid Ag: NEGATIVE

## 2022-05-04 LAB — POC SOFIA SARS ANTIGEN FIA: SARS Coronavirus 2 Ag: NEGATIVE

## 2022-05-04 MED ORDER — DOXYCYCLINE HYCLATE 100 MG PO TABS: 100.0000 mg | ORAL_TABLET | Freq: Two times a day (BID) | ORAL | 0 refills | Status: DC

## 2022-05-04 NOTE — Progress Notes (Unsigned)
Patient ID: Heather Dawson, female   DOB: [birth year], 67 y.o.   MRN: [id number]        Chief Complaint: follow up sinus infection, dm, htn, ckd3a       HPI:  Heather Dawson is a 67 y.o. female  Here with 2-3 days acute onset fever, facial pain, pressure, headache, general weakness and malaise, and greenish d/c, with mild ST and cough, but pt denies chest pain, wheezing, increased sob or doe, orthopnea, PND, increased LE swelling, palpitations, dizziness or syncope.   Pt denies polydipsia, polyuria, or new focal neuro s/s.    Pt denies recent wt loss, night sweats, loss of appetite, or other constitutional symptoms         Wt Readings from Last 3 Encounters:  05/04/22 188 lb (85.3 kg)  04/09/22 187 lb (84.8 kg)  03/19/22 185 lb (83.9 kg)   BP Readings from Last 3 Encounters:  05/04/22 134/82  04/09/22 130/78  02/14/22 120/68         Past Medical History:  Diagnosis Date   Abdominal pain, epigastric 12/20/2006   ALLERGIC RHINITIS 12/25/2006   Allergy    ANXIETY 10/16/2006   no per pt   Arthritis    right shoulder   BACK PAIN 06/15/2008   Blood transfusion without reported diagnosis    BRADYCARDIA 05/04/2008   C. difficile diarrhea    Carpal tunnel syndrome 10/16/2006   Cellulitis and abscess of other specified site 06/15/2008   CHEST PAIN 09/08/2009   Chronic low back pain    CIRRHOSIS 10/16/2006   CKD (chronic kidney disease)    2008- had Ecoli and caused renal failure, no problems since then   CKD (chronic kidney disease) stage 3, GFR 30-59 ml/min ([place]) 10/16/2006   Qualifier: Diagnosis of  By: Jenny Reichmann MD, [place] [id number]   Complication of anesthesia    awake during 2 surgeries   CONTUSION, LOWER LEG 09/15/2008   DEPRESSION 10/16/2006   no per pt   Diabetes mellitus without complication ([place])    Esophageal reflux 12/25/2006   HYPERLIPIDEMIA 12/25/2006   Hyperlipidemia    HYPERTENSION 08/15/2006   Hypertension    HYPOTHYROIDISM 08/15/2006   Kidney failure, acute  ([place]) 2009   as a result of severe E Coli infection   Left-sided carotid artery disease ([place])    MIGRAINE HEADACHE 10/16/2006   Obesity 10/16/2006   OVARIAN CYST 12/20/2006   Psoriasis 01/03/2011   SHINGLES, HX OF 12/20/2006   Stroke ([place])    3 strokes 2008-no deficits, only on ASA   TRANSIENT ISCHEMIC ATTACKS, HX OF 12/20/2006   VERTIGO 09/15/2007   VITAMIN B12 DEFICIENCY 01/09/2007   Past Surgical History:  Procedure Laterality Date   ABDOMINAL HYSTERECTOMY  02/2006   ANTERIOR CERVICAL DECOMP/DISCECTOMY FUSION  2005   BIOPSY  11/27/2021   Procedure: BIOPSY;  Surgeon: Yetta Flock, MD;  Location: [facility];  Service: Gastroenterology;;   CESAREAN SECTION     CHOLECYSTECTOMY     COLONOSCOPY     COLONOSCOPY WITH PROPOFOL N/A 11/27/2021   Procedure: COLONOSCOPY WITH PROPOFOL;  Surgeon: Yetta Flock, MD;  Location[facility] END[facility]COPY;  Service: Gastroenterology;  Laterality: N/A;   ENDARTERECTOMY Left 08/25/2014   Procedure: LEFT CAROTID ENDARTERECTOMY WITH HEMASHIELD PATCH ANGIOPLASTY;  Surgeon: Elam Dutch, MD;  Location: [facility];  Service: Vascular;  Laterality: Left;   ESOPHAGOGASTRODUODENOSCOPY (EGD) WITH PROPOFOL N/A 11/27/2021   Procedure: ESOPHAGOGASTRODUODENOSCOPY (EGD) WITH PROPOFOL;  Surgeon: Havery Moros,  Carlota Raspberry, MD;  Location: [facility] ENDOSCOPY;  Service: Gastroenterology;  Laterality: N/A;   NECK SURGERY  2005   ROTATOR CUFF REPAIR Right 02/2014   Dr Veverly Fells    reports that she has quit smoking. She has never used smokeless tobacco. She reports that she does not drink alcohol and does not use drugs. family history includes Diabetes in her father, sister, and another family member; Heart disease in her brother, brother, father, and mother; Hypertension in her brother, brother and another family member; Lupus in her mother; Raynaud syndrome in her mother; Stroke in her father. Allergies  Allergen Reactions   Macrodantin [Nitrofurantoin Macrocrystal] Shortness Of Breath,  Diarrhea and Other (See Comments)    GI upset   Adipex-P [Phentermine] Other (See Comments)    "All the side effects listed on the paper"   Asa [Aspirin] Other (See Comments)    Told to avoid due to kidney function   Bactrim [Sulfamethoxazole-Trimethoprim] Nausea And Vomiting   Cipro [Ciprofloxacin Hcl] Swelling   Levaquin [Levofloxacin In D5w] Other (See Comments)    GI upset   Lipitor [Atorvastatin] Other (See Comments)    Myalgia    Lyrica [Pregabalin] Swelling and Other (See Comments)    Headaches Confusion   Nsaids Other (See Comments)    Told to avoid due to kidney function   Vibramycin [Doxycycline] Nausea Only   Zinacef [Cefuroxime] Swelling   Neurontin [Gabapentin] Rash   Current Outpatient Medications on File Prior to Visit  Medication Sig Dispense Refill   acetaminophen (TYLENOL) 500 MG tablet Take 1,000 mg by mouth every 8 (eight) hours as needed (pain).     amLODipine (NORVASC) 10 MG tablet 1 tab by mouth once daily (Patient taking differently: Take 10 mg by mouth daily.) 90 tablet 3   Ascorbic Acid (VITAMIN C) 1000 MG tablet Take 1,000 mg by mouth daily.     aspirin EC 81 MG tablet Take 81 mg by mouth daily.     carvedilol (COREG) 3.125 MG tablet Take 3.125 mg by mouth 2 (two) times daily.     chlorpheniramine-HYDROcodone (TUSSIONEX) 10-8 MG/5ML Take 5 mLs by mouth every 12 (twelve) hours as needed for cough. 115 mL 0   Cholecalciferol (VITAMIN D3) 1000 units CAPS Take 2 capsules (2,000 Units total) by mouth daily. 60 capsule 0   clobetasol (TEMOVATE) 0.05 % external solution Apply 1 application topically daily as needed (scalp psoriasis).   0   Continuous Blood Gluc Receiver (DEXCOM G6 RECEIVER) DEVI Use to check blood sugars daily 1 each 0   Continuous Blood Gluc Sensor (DEXCOM G6 SENSOR) MISC Use to check blood sugars daily 3 each 3   estradiol (ESTRACE) 0.1 MG/GM vaginal cream Place 1 Applicatorful vaginally See admin instructions. Apply to vagina twice weekly on  Monday, Thursday.     famotidine (PEPCID) 20 MG tablet Take 1 tablet (20 mg total) by mouth 2 (two) times daily. 180 tablet 3   glipiZIDE (GLUCOTROL XL) 2.5 MG 24 hr tablet Take 1 tablet (2.5 mg total) by mouth daily with breakfast. 90 tablet 3   hydrALAZINE (APRESOLINE) 100 MG tablet Take 1 tablet (100 mg total) by mouth 3 (three) times daily. 90 tablet 11   levocetirizine (XYZAL) 5 MG tablet Take 5 mg by mouth every evening.     levothyroxine (SYNTHROID) 75 MCG tablet Take 1 tablet (75 mcg total) by mouth daily. 90 tablet 3   lovastatin (MEVACOR) 40 MG tablet Take 2 tablets (80 mg total) by mouth at  bedtime. 180 tablet 3   olmesartan (BENICAR) 5 MG tablet Take 5 mg by mouth daily.     pantoprazole (PROTONIX) 40 MG tablet Take 1 tablet (40 mg total) by mouth 2 (two) times daily before a meal. (Patient taking differently: Take 40 mg by mouth 2 (two) times daily.) 180 tablet 3   triamcinolone cream (KENALOG) 0.1 % Apply 1 application  topically daily as needed (psoriasis).     TURMERIC CURCUMIN PO Take 1 tablet by mouth daily.     No current facility-administered medications on file prior to visit.        ROS:  All others reviewed and negative.  Objective        PE:  BP 134/82   Pulse 65   Temp 98.8 F (37.1 C) (Oral)   Ht 5\' 2"  (1.575 m)   Wt 188 lb (85.3 kg)   SpO2 94%   BMI 34.39 kg/m                 Constitutional: Pt appears in NAD               HENT: Head: NCAT.                Right Ear: External ear normal.                 Left Ear: External ear normal. Bilat tm's with mild erythema.  Max sinus areas mild tender.  Pharynx with mild erythema, no exudate               Eyes: . Pupils are equal, round, and reactive to light. Conjunctivae and EOM are normal               Nose: without d/c or deformity               Neck: Neck supple. Gross normal ROM               Cardiovascular: Normal rate and regular rhythm.                 Pulmonary/Chest: Effort normal and breath sounds  without rales or wheezing.                               Neurological: Pt is alert. At baseline orientation, motor grossly intact               Skin: Skin is warm. No rashes, no other new lesions, LE edema - none               Psychiatric: Pt behavior is normal without agitation   Micro: none  Cardiac tracings I have personally interpreted today:  none  Pertinent Radiological findings (summarize): none   Lab Results  Component Value Date   WBC 9.6 04/09/2022   HGB 13.7 04/09/2022   HCT 40.0 04/09/2022   PLT 261.0 04/09/2022   GLUCOSE 117 (H) 04/09/2022   CHOL 156 04/09/2022   TRIG 97.0 04/09/2022   HDL 47.80 04/09/2022   LDLDIRECT 107.0 10/06/2021   LDLCALC 89 04/09/2022   ALT 14 04/09/2022   AST 12 04/09/2022   NA 142 04/09/2022   K 4.1 04/09/2022   CL 107 04/09/2022   CREATININE 1.74 (H) 04/09/2022   BUN 33 (H) 04/09/2022   CO2 24 04/09/2022   TSH 6.71 (H) 04/09/2022   INR 0.99 08/17/2014   HGBA1C 6.7 (H) 04/09/2022  MICROALBUR 7.0 (H) 04/09/2022   POCT - Covid- neg, Flu - neg, RSV - neg  Assessment/Plan:  Heather Dawson is a 67 y.o. White or Caucasian [1] female with  has a past medical history of Abdominal pain, epigastric (12/20/2006), ALLERGIC RHINITIS (12/25/2006), Allergy, ANXIETY (10/16/2006), Arthritis, BACK PAIN (06/15/2008), Blood transfusion without reported diagnosis, BRADYCARDIA (05/04/2008), C. difficile diarrhea, Carpal tunnel syndrome (10/16/2006), Cellulitis and abscess of other specified site (06/15/2008), CHEST PAIN (09/08/2009), Chronic low back pain, CIRRHOSIS (10/16/2006), CKD (chronic kidney disease), CKD (chronic kidney disease) stage 3, GFR 30-59 ml/min ([facility]) (10/16/2006), COMMON MIGRAINE (99991111), Complication of anesthesia, CONTUSION, LOWER LEG (09/15/2008), DEPRESSION (10/16/2006), Diabetes mellitus without complication ([place]), Esophageal reflux (12/25/2006), HYPERLIPIDEMIA (12/25/2006), Hyperlipidemia, HYPERTENSION (08/15/2006), Hypertension, HYPOTHYROIDISM  (08/15/2006), Kidney failure, acute ([place]) (2009), Left-sided carotid artery disease ([place]), MIGRAINE HEADACHE (10/16/2006), Obesity (10/16/2006), OVARIAN CYST (12/20/2006), Psoriasis (01/03/2011), SHINGLES, HX OF (12/20/2006), Stroke ([place]), TRANSIENT ISCHEMIC ATTACKS, HX OF (12/20/2006), VERTIGO (09/15/2007), and VITAMIN B12 DEFICIENCY (01/09/2007).  Acute sinus infection Mild to mod, for antibx course doxycycline 100 bid,  also for cxr due to cough r/o pna, to f/u any worsening symptoms or concerns  Essential hypertension BP Readings from Last 3 Encounters:  05/04/22 134/82  04/09/22 130/78  02/14/22 120/68   Stable, pt to continue medical treatment norvasc 10 qd, benicar 5 mg qd, coreg 3.125 mg qd, hdyralazine 100 tid   Diabetes mellitus type 2 in obese [facility]) Lab Results  Component Value Date   HGBA1C 6.7 (H) 04/09/2022   Stable, pt to continue current medical treatment glucotrol xl 2.5 qd,    CKD (chronic kidney disease) stage 3, GFR 30-59 ml/min ([facility]) Lab Results  Component Value Date   CREATININE 1.74 (H) 04/09/2022   Stable overall, cont to avoid nephrotoxins  Followup: Return if symptoms worsen or fail to improve.  [name], MD 05/06/2022 3:44 PM [facility]

## 2022-05-04 NOTE — Patient Instructions (Signed)
Please take all new medication as prescribed   - the antibiotic, and cough medicine  Please continue all other medications as before, and refills have been done if requested.  Please have the pharmacy call with any other refills you may need.  Please keep your appointments with your specialists as you may have planned  Please go to the XRAY Department in the first floor for the x-ray testing  You will be contacted by phone if any changes need to be made immediately.  Otherwise, you will receive a letter about your results with an explanation, but please check with MyChart first.

## 2022-05-06 ENCOUNTER — Encounter: Payer: Self-pay | Admitting: Internal Medicine

## 2022-05-06 NOTE — Assessment & Plan Note (Signed)
Lab Results  Component Value Date   CREATININE 1.74 (H) 04/09/2022   Stable overall, cont to avoid nephrotoxins

## 2022-05-06 NOTE — Assessment & Plan Note (Signed)
BP Readings from Last 3 Encounters:  05/04/22 134/82  04/09/22 130/78  02/14/22 120/68   Stable, pt to continue medical treatment norvasc 10 qd, benicar 5 mg qd, coreg 3.125 mg qd, hdyralazine 100 tid

## 2022-05-06 NOTE — Assessment & Plan Note (Signed)
Lab Results  Component Value Date   HGBA1C 6.7 (H) 04/09/2022   Stable, pt to continue current medical treatment glucotrol xl 2.5 qd,

## 2022-05-06 NOTE — Assessment & Plan Note (Addendum)
Mild to mod, for antibx course doxycycline 100 bid,  also for cxr due to cough r/o pna, to f/u any worsening symptoms or concerns

## 2022-05-07 ENCOUNTER — Encounter: Payer: Self-pay | Admitting: Internal Medicine

## 2022-05-07 MED ORDER — DEXCOM G6 SENSOR MISC: 3 refills | Status: DC

## 2022-05-07 MED ORDER — DEXCOM G6 TRANSMITTER MISC: 3 refills | Status: DC

## 2022-05-07 NOTE — Telephone Encounter (Signed)
Pt was rx doxycycline (VIBRA-TABS) 100 MG tablet .Marland KitchenJohny Dawson

## 2022-05-07 NOTE — Addendum Note (Signed)
Addended by: Earnstine Regal on: 05/07/2022 01:00 PM   Modules accepted: Orders

## 2022-05-07 NOTE — Telephone Encounter (Signed)
Sent rx to [facility] for transmitter.Marland KitchenJohny Dawson

## 2022-05-07 NOTE — Telephone Encounter (Signed)
Patient picked up Dexcom from her pharmacy, but they said the transmitter for it was not prescribed. Her pharmacy has sent a fax to get that filled, but she called just to make sure it was received.  Best callback is [phone].

## 2022-05-10 DIAGNOSIS — N183 Chronic kidney disease, stage 3 unspecified: Secondary | ICD-10-CM | POA: Diagnosis not present

## 2022-05-10 DIAGNOSIS — I129 Hypertensive chronic kidney disease with stage 1 through stage 4 chronic kidney disease, or unspecified chronic kidney disease: Secondary | ICD-10-CM | POA: Diagnosis not present

## 2022-05-10 DIAGNOSIS — E039 Hypothyroidism, unspecified: Secondary | ICD-10-CM | POA: Diagnosis not present

## 2022-05-10 DIAGNOSIS — K219 Gastro-esophageal reflux disease without esophagitis: Secondary | ICD-10-CM | POA: Diagnosis not present

## 2022-05-14 DIAGNOSIS — R490 Dysphonia: Secondary | ICD-10-CM | POA: Insufficient documentation

## 2022-05-23 DIAGNOSIS — R338 Other retention of urine: Secondary | ICD-10-CM | POA: Diagnosis not present

## 2022-05-23 DIAGNOSIS — N281 Cyst of kidney, acquired: Secondary | ICD-10-CM | POA: Diagnosis not present

## 2022-05-28 ENCOUNTER — Ambulatory Visit: Payer: Medicare Other

## 2022-05-30 ENCOUNTER — Ambulatory Visit: Payer: Medicare Other | Attending: Physician Assistant

## 2022-05-30 DIAGNOSIS — R498 Other voice and resonance disorders: Secondary | ICD-10-CM | POA: Insufficient documentation

## 2022-05-30 NOTE — Therapy (Signed)
OUTPATIENT SPEECH LANGUAGE PATHOLOGY VOICE EVALUATION   Patient Name: Heather Dawson MRN: [id number] DOB:08-24-1955, 67 y.o., female Today's Date: 05/30/2022  PCP: Corwin Levins MD REFERRING PROVIDER: Vivianne Master, PA  END OF SESSION:  End of Session - 05/30/22 1102     Visit Number 1    Number of Visits 13    Date for SLP Re-Evaluation 08/22/22    Authorization Type [employer] $10 copay    SLP Start Time 1015    SLP Stop Time  1058    SLP Time Calculation (min) 43 min    Activity Tolerance Patient tolerated treatment well             Past Medical History:  Diagnosis Date   Abdominal pain, epigastric 12/20/2006   ALLERGIC RHINITIS 12/25/2006   Allergy    ANXIETY 10/16/2006   no per pt   Arthritis    right shoulder   BACK PAIN 06/15/2008   Blood transfusion without reported diagnosis    BRADYCARDIA 05/04/2008   C. difficile diarrhea    Carpal tunnel syndrome 10/16/2006   Cellulitis and abscess of other specified site 06/15/2008   CHEST PAIN 09/08/2009   Chronic low back pain    CIRRHOSIS 10/16/2006   CKD (chronic kidney disease)    2008- had Ecoli and caused renal failure, no problems since then   CKD (chronic kidney disease) stage 3, GFR 30-59 ml/min 10/16/2006   Qualifier: Diagnosis of  By: Jonny Ruiz MD, Len Blalock    COMMON MIGRAINE 12/20/2006   Complication of anesthesia    awake during 2 surgeries   CONTUSION, LOWER LEG 09/15/2008   DEPRESSION 10/16/2006   no per pt   Diabetes mellitus without complication    Esophageal reflux 12/25/2006   HYPERLIPIDEMIA 12/25/2006   Hyperlipidemia    HYPERTENSION 08/15/2006   Hypertension    HYPOTHYROIDISM 08/15/2006   Kidney failure, acute 2009   as a result of severe E Coli infection   Left-sided carotid artery disease    MIGRAINE HEADACHE 10/16/2006   Obesity 10/16/2006   OVARIAN CYST 12/20/2006   Psoriasis 01/03/2011   SHINGLES, HX OF 12/20/2006   Stroke    3 strokes 2008-no deficits, only on ASA   TRANSIENT ISCHEMIC ATTACKS, HX  OF 12/20/2006   VERTIGO 09/15/2007   VITAMIN B12 DEFICIENCY 01/09/2007   Past Surgical History:  Procedure Laterality Date   ABDOMINAL HYSTERECTOMY  02/2006   ANTERIOR CERVICAL DECOMP/DISCECTOMY FUSION  2005   BIOPSY  11/27/2021   Procedure: BIOPSY;  Surgeon: Benancio Deeds, MD;  Location: [facility];  Service: Gastroenterology;;   CESAREAN SECTION     CHOLECYSTECTOMY     COLONOSCOPY     COLONOSCOPY WITH PROPOFOL N/A 11/27/2021   Procedure: COLONOSCOPY WITH PROPOFOL;  Surgeon: Benancio Deeds, MD;  Location: [facility];  Service: Gastroenterology;  Laterality: N/A;   ENDARTERECTOMY Left 08/25/2014   Procedure: LEFT CAROTID ENDARTERECTOMY WITH HEMASHIELD PATCH ANGIOPLASTY;  Surgeon: Sherren Kerns, MD;  Location: [facility] OR;  Service: Vascular;  Laterality: Left;   ESOPHAGOGASTRODUODENOSCOPY (EGD) WITH PROPOFOL N/A 11/27/2021   Procedure: ESOPHAGOGASTRODUODENOSCOPY (EGD) WITH PROPOFOL;  Surgeon: Benancio Deeds, MD;  Location: [facility];  Service: Gastroenterology;  Laterality: N/A;   NECK SURGERY  2005   ROTATOR CUFF REPAIR Right 02/2014   [name]   Patient Active Problem List   Diagnosis Date Noted   UTI (urinary tract infection) 01/11/2022   Acute kidney injury superimposed on chronic kidney disease 11/25/2021  Transient hypotension 11/25/2021   Obesity (BMI 30-39.9) 11/25/2021   Leukocytosis 11/25/2021   Bilateral lower extremity edema 11/21/2021   Wheezing 06/11/2021   Acute hearing loss, right 05/16/2021   COVID-19 virus infection 04/23/2021   Colitis 10/12/2020   Gastrointestinal hemorrhage 10/12/2020   Left lower quadrant abdominal pain 10/12/2020   Diabetes mellitus type 2 in obese 10/12/2020   CKD (chronic kidney disease) stage 4, GFR 15-29 ml/min 10/12/2020   Aortic atherosclerosis 08/12/2020   Left-sided face pain 03/27/2020   Left groin pain 09/29/2019   Fatigue 09/29/2019   Syncope 08/04/2018   Pain in finger of left hand 06/23/2018   Mild  aortic stenosis 05/06/2018   Trigger finger, left 04/30/2018   Insomnia 04/30/2018   Thoracic back pain 10/24/2017   Left shoulder pain 04/23/2017   Dysuria 04/04/2017   Bloating symptom 04/04/2017   Acute pain of left shoulder 02/06/2017   Urinary urgency 11/24/2016   Rash 10/05/2016   Bunion of right foot 09/22/2016   Trigger middle finger of left hand 09/22/2016   Arthritis of hand, degenerative 09/22/2016   Left foot pain 02/22/2016   Pain and swelling of right lower leg 02/22/2016   Neck pain 02/01/2016   Postop carotid endarterectomy surveillance, encounter for 12/01/2015   Acute sinus infection 04/27/2015   Left carotid artery stenosis 08/25/2014   Left-sided carotid artery disease 08/10/2014   Stenosis of right carotid artery 07/27/2014   Chronic low back pain 07/14/2014   Neurogenic claudication 04/28/2014   Dizziness and giddiness 11/06/2013   GERD (gastroesophageal reflux disease) 11/06/2013   Influenza 02/21/2013   Paronychia 12/30/2012   Encounter for long-term (current) use of high-risk medication 03/02/2011   Psoriasis 01/03/2011   Encounter for well adult exam with abnormal findings 12/30/2010   Atypical chest pain 09/08/2009   LEG CRAMPS 06/20/2009   Diarrhea 06/20/2009   HEMATOCHEZIA 03/21/2009   Hematochezia 03/21/2009   GANGLION CYST, TENDON SHEATH 11/18/2008   Abdominal pain in female 08/31/2008   Low back pain 06/15/2008   Transient cerebral ischemia 10/14/2007   ESOPHAGITIS 03/13/2007   B12 deficiency 01/09/2007   Hyperlipidemia 12/25/2006   Allergic rhinitis 12/25/2006   COMMON MIGRAINE 12/20/2006   OVARIAN CYST 12/20/2006   Other malaise and fatigue 12/20/2006   Abdominal pain, epigastric 12/20/2006   SHINGLES, HX OF 12/20/2006   CHOLERA, D/T VIBRIO CHOLERAE EL TOR 10/16/2006   Morbid obesity (HCC) 10/16/2006   Anxiety state 10/16/2006   Depression 10/16/2006   Carpal tunnel syndrome 10/16/2006   CKD (chronic kidney disease) stage 3, GFR  30-59 ml/min 10/16/2006   Hypothyroidism 08/15/2006   Essential hypertension 08/15/2006    Onset date: ~1.5 years; 05/16/2022 (referral date)  REFERRING DIAG: R49.0 (ICD-10-CM) - Dysphonia  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 Schleicher presents today with complaint of hoarseness. Her husband accompanies her on today's visit. She states that this has bothered her for about 1 to 1-1/2 years. She does not smoke. She does have a history of reflux and is on Pepcid twice daily and Protonix daily. Some mild allergies for which she takes Xyzal. She does sing when able. However, she finds that her voice gives out frequently. PHMX: GERD  PAIN: Are you having pain? No  FALLS: Has patient fallen in last 6 months? No  LIVING ENVIRONMENT: Lives with: lives  with their family Lives in: House/apartment  PLOF: Independent  PATIENT GOALS: improve voice and return to singing  OBJECTIVE:   DIAGNOSTIC FINDINGS: Flexible nasal Endoscopy (05/14/22): Slight bowing of the true vocal folds. No nasopharyngeal or laryngeal lesions   COGNITION:Overall cognitive status: Within functional limits for tasks assessed  SOCIAL HISTORY: Occupation: Retired  Counsellor intake: optimal Caffeine/alcohol intake: minimal Daily voice use: moderate  PERCEPTUAL VOICE ASSESSMENT: Voice quality: hoarse, breathy, vocal fatigue, and aphonic Vocal abuse: habitual throat clearing and abnormal breathing pattern Resonance: normal Respiratory function: clavicular breathing  OBJECTIVE VOICE ASSESSMENT: Maximum phonation time for sustained "ah": 10 seconds Conversational loudness average: 71 dB Conversational loudness range: 71-74 dB S/z ratio: 1 (Suggestive of dysfunction >1.0)  PATIENT REPORTED OUTCOME MEASURES (PROM): V-RQOL: 25  (good)  TODAY'S TREATMENT:                                                                                                                                         05-30-22: Educated patient on vocal hygiene protocol due to suspected increased irritation secondary to usual coughing/throat clearing. Handout provided. Plan to educate and train PhoRTE to address slight vocal cord bowing resulting hoarse, breathy vocal quality. Pt verbalized understanding and agreement with SLP POC and recommendations. Denied further questions at this time.    PATIENT EDUCATION: Education details: see above Person educated: Patient and Spouse Education method: Explanation, Demonstration, and Handouts Education comprehension: verbalized understanding, returned demonstration, and needs further education  HOME EXERCISE PROGRAM: Vocal Hygiene, Abdominal Breathing, PhoRTE  GOALS: Goals reviewed with patient? Yes  SHORT TERM GOALS: Target date: 06/27/2022  Pt will carryover HEP as recommended > 1 week Baseline: Goal status: INITIAL  2.  Pt will employ throat clear alternatives and cough suppression techniques to reduce laryngeal irritation for 80% of opportunities given occasional min A over 2 sessions  Baseline:  Goal status: INITIAL  3.  Pt will demo abdominal breathing and exuberant voice on structured tasks for 80% of opportunities given occasional min A over 2 sessions Baseline:  Goal status: INITIAL  4.  Pt will ID and attempt to self-correct hoarseness in short structured conversation given occasional min A over 2 sessions  Baseline:  Goal status: INITIAL   LONG TERM GOALS: Target date: 08/22/2022  Pt will carryover HEP as recommended > 2 weeks Baseline:  Goal status: INITIAL  2.  Pt will ID hoarseness and correct with learned techniques in unstructured conversations for 90% of opportunities given rare min A Baseline:  Goal status: INITIAL  3.  Pt will consistently carryover vocal hygiene  protocol given rare min A by last ST session  Baseline:  Goal status: INITIAL  4.  Pt will report increased satisfaction with vocal quality via PROM by 2 points at last ST session Baseline: V-RQOL 25 Goal status: INITIAL  ASSESSMENT:  CLINICAL IMPRESSION: Patient is a 67 y.o. F who was seen today  for dysphonia. ENT recently scoped and noted slight bowing of vocal cords, which appears age-related. Today, pt presents with mild hoarse, breathy vocal quality and reduced breath support. Reported onset ~1.5 years ago, which has impacted her ability to converse and sing in church choir. On occasional, pt reports aphonia as pt stated "my voice just gives out." Recently treated for bronchitis, although frequent coughing reported over last several years. Coughing triggered by seasonal changes, dust, and air conditioning. Using honey cough drops to aid irritation. Some occasional excessive voice use and loudness reported as her husband is HOH. Pt would benefit from skilled ST intervention to optimize vocal hygiene and maximize vocal cord closure for improved voicing and overall increased communication effectiveness.   OBJECTIVE IMPAIRMENTS: include voice disorder. These impairments are limiting patient from effectively communicating at home and in community. Factors affecting potential to achieve goals and functional outcome are  N/A . Patient will benefit from skilled SLP services to address above impairments and improve overall function.  REHAB POTENTIAL: Good  PLAN:  SLP FREQUENCY: 1x/week  SLP DURATION: 12 weeks  PLANNED INTERVENTIONS: Environmental controls, Cueing hierachy, Internal/external aids, Functional tasks, SLP instruction and feedback, Compensatory strategies, and Re-evaluation    Gracy RacerKatherine I Johnson, CCC-SLP 05/30/2022, 11:23 AM

## 2022-05-30 NOTE — Patient Instructions (Signed)
         VOICE CONSERVATION PROGRAM  1. Avoid overuse of voice or excessive use of the voice The vocal cords can become easily fatigued. Try to sort out what is "necessary" versus "unnecessary" talking in your environment. You do not need to STOP talking, but try to limit it as much as possible.  Think of resting your voice just as long as you talk. For example, if you talk for 5 minutes, rest your voice completely for 5 minutes. If your voice feels "tired" during the day, try to rest it as much as possible.  Avoid using an excessively loud voice or shouting/raising your voice When you yell or raise your voice, the vocal cords slam into each other, much like a strong hand clap. This causes irritation, and if this irritation continues, hoarseness may increase. If people in your home talk loudly, ask them to reduce the volume of their voices to help you decrease your volume as well. Sit near or face the person to whom you are speaking.   3. Avoid talking over background noise When talking in background noise, speech automatically has increased loudness, and as in number 2 above, continued loud speech can result in increased hoarseness due to irritation to the vocal cords.  Do not talk over the radio or the TV. Mute them before speaking, go to a quieter place to talk, or sit next to the person with whom you are watching.  4. Talk in a voice that is soft, smooth, and gentle This allows the vocal cords to come together in a gentle way and allows the air being exhaled from the lungs to do most/all of the work when you are speaking.  5. Avoid excessive throat clearing, coughing, and loud laughter During these activities the vocal cords can also be slammed together in a hard way and foster irritation or swelling, which can cause hoarseness. Try taking sips of room temperature/cool water with hard swallows to clear secretions from the throat, instead of throat clearing or coughing. If you absolutely  must clear your throat, do so as gently as possible. If you find yourself clearing your throat or coughing a lot, consult your physician. You will want to laugh. Continue to do so, but softly and gently. Do not laugh loudly for long periods of time because this can increase the chances for vocal fold irritation and thus hoarseness. Lozenges can help reduce the need to clear throat/cough during cold/allergy season.  6. Keep the mouth and throat lubricated Drink at least 8-10 8 oz. glasses of water per day (64-80 oz.). This water can come in the form of drink mixes.  Caffeinated beverages such as colas, coffee, and tea (hot tea AND sweet tea) dry out the vocal cords and then can cause irritation and thus hoarseness.  Drinking water will keep your body hydrated. This will help to decrease secretions in the throat, which cause people to clear their throats or cough. If you are exercising outside (especially during drier weather), try to breathe through your nose as much as possible. If this is not possible, lifting the tongue up behind the upper teeth when breathing through the mouth will add some moisture to the air breathed in past the vocal cords.  7. Avoid mouth breathing - breathe through your nose Your nose serves as a natural filter for dust and dirt particles from the air, and as a humidifier to moisten the air. Vocal cords like to work in moistened, filtered   air. When you breathe through your mouth you lose the air filtering and moistening benefits of breathing through the nose. Be aware of breathing patterns when sitting quietly (e.g., reading or watching TV). Increase your awareness and try to change habits from mouth breathing to nose breathing during those times.  8. Avoid environmental and/or ingested irritants Try to avoid smoke-filled and or dusty environments. These items dry out the vocal cords and cause irritation.  9. Use an air filter if the home is dusty, and/or a humidifier if the  air is dry  This will help to maintain clean, humid air to breathe. Remember, this type of air is what the vocal cords like best.  

## 2022-05-31 DIAGNOSIS — K080 Exfoliation of teeth due to systemic causes: Secondary | ICD-10-CM | POA: Diagnosis not present

## 2022-06-09 ENCOUNTER — Other Ambulatory Visit: Payer: Self-pay | Admitting: Internal Medicine

## 2022-06-10 ENCOUNTER — Other Ambulatory Visit: Payer: Self-pay | Admitting: Internal Medicine

## 2022-06-11 ENCOUNTER — Other Ambulatory Visit: Payer: Self-pay | Admitting: Internal Medicine

## 2022-06-11 DIAGNOSIS — M545 Low back pain, unspecified: Secondary | ICD-10-CM | POA: Diagnosis not present

## 2022-06-13 ENCOUNTER — Ambulatory Visit: Payer: Medicare Other

## 2022-06-13 DIAGNOSIS — R498 Other voice and resonance disorders: Secondary | ICD-10-CM

## 2022-06-13 NOTE — Patient Instructions (Addendum)
ABDOMINAL BREATHING  Shoulders down - this is a cue to relax Place your hand on your abdomen - this helps you focus on easy abdominal breath support - the best and most relaxed way to breathe Breathe in through your nose and fill your belly with air, watching your hand move outward Breathe out through your mouth and watch your belly move in. An audible "sh"  may help  Think of your belly as a balloon, when you fill with air (inhale), the balloon gets bigger. As the air goes out (exhale), the balloon deflates.  If you are having difficulty coordinating this, lay on your back with a plastic cup on your belly and repeat the above steps, watching you belly move up with inhalation and down with exhalations  Practice breathing in and out in front of a mirror, watching your belly Breathe in for a count of 5 and breathe out for a count of 5  Semi-occluded vocal tract exercises (SOVTE)  These allow your vocal folds to vibrate without excess tension and promotes high placement of the voice  Use SOVTE as a warm up before prolonged speaking and vocal exercises  Watch Vocal Straw Exercises with Lenox Ahr on YouTube: [email or web address]  Deep belly breath in, blow out through rounded lips/straw (long and strong) Deep belly breath in, hum through rounded lips/straw (long and strong) Deep belly breath in, hum pitch from low to high  Deep belly breath in, hum pitch from high to low   A goal would be 2-3 minutes several times a day and prior to vocal exercises  As always, use good belly breathing while completing SOVTE  Remember: breathe in, speak out! Make sure you have some power and are sending your voice forward! Think about that buzzy feeling at the front of your face

## 2022-06-13 NOTE — Therapy (Signed)
OUTPATIENT SPEECH LANGUAGE PATHOLOGY VOICE TREATMENT   Patient Name: Heather Dawson MRN: [id number] DOB:[birth year], 67 y.o., female Today's Date: 06/13/2022  PCP: Corwin Levins MD REFERRING PROVIDER: Vivianne Master, PA  END OF SESSION:  End of Session - 06/13/22 1100     Visit Number 2    Number of Visits 13    Date for SLP Re-Evaluation 08/22/22    Authorization Type [employer] $10 copay    SLP Start Time 1017    SLP Stop Time  1058    SLP Time Calculation (min) 41 min    Activity Tolerance Patient tolerated treatment well              Past Medical History:  Diagnosis Date   Abdominal pain, epigastric 12/20/2006   ALLERGIC RHINITIS 12/25/2006   Allergy    ANXIETY 10/16/2006   no per pt   Arthritis    right shoulder   BACK PAIN 06/15/2008   Blood transfusion without reported diagnosis    BRADYCARDIA 05/04/2008   C. difficile diarrhea    Carpal tunnel syndrome 10/16/2006   Cellulitis and abscess of other specified site 06/15/2008   CHEST PAIN 09/08/2009   Chronic low back pain    CIRRHOSIS 10/16/2006   CKD (chronic kidney disease)    2008- had Ecoli and caused renal failure, no problems since then   CKD (chronic kidney disease) stage 3, GFR 30-59 ml/min 10/16/2006   Qualifier: Diagnosis of  By: Jonny Ruiz MD, Len Blalock    COMMON MIGRAINE 12/20/2006   Complication of anesthesia    awake during 2 surgeries   CONTUSION, LOWER LEG 09/15/2008   DEPRESSION 10/16/2006   no per pt   Diabetes mellitus without complication    Esophageal reflux 12/25/2006   HYPERLIPIDEMIA 12/25/2006   Hyperlipidemia    HYPERTENSION 08/15/2006   Hypertension    HYPOTHYROIDISM 08/15/2006   Kidney failure, acute 2009   as a result of severe E Coli infection   Left-sided carotid artery disease    MIGRAINE HEADACHE 10/16/2006   Obesity 10/16/2006   OVARIAN CYST 12/20/2006   Psoriasis 01/03/2011   SHINGLES, HX OF 12/20/2006   Stroke    3 strokes 2008-no deficits, only on ASA   TRANSIENT ISCHEMIC ATTACKS,  HX OF 12/20/2006   VERTIGO 09/15/2007   VITAMIN B12 DEFICIENCY 01/09/2007   Past Surgical History:  Procedure Laterality Date   ABDOMINAL HYSTERECTOMY  02/2006   ANTERIOR CERVICAL DECOMP/DISCECTOMY FUSION  2005   BIOPSY  11/27/2021   Procedure: BIOPSY;  Surgeon: Benancio Deeds, MD;  Location: [facility];  Service: Gastroenterology;;   CESAREAN SECTION     CHOLECYSTECTOMY     COLONOSCOPY     COLONOSCOPY WITH PROPOFOL N/A 11/27/2021   Procedure: COLONOSCOPY WITH PROPOFOL;  Surgeon: Benancio Deeds, MD;  Location: [facility];  Service: Gastroenterology;  Laterality: N/A;   ENDARTERECTOMY Left 08/25/2014   Procedure: LEFT CAROTID ENDARTERECTOMY WITH HEMASHIELD PATCH ANGIOPLASTY;  Surgeon: Sherren Kerns, MD;  Location: [facility] OR;  Service: Vascular;  Laterality: Left;   ESOPHAGOGASTRODUODENOSCOPY (EGD) WITH PROPOFOL N/A 11/27/2021   Procedure: ESOPHAGOGASTRODUODENOSCOPY (EGD) WITH PROPOFOL;  Surgeon: Benancio Deeds, MD;  Location: [facility];  Service: Gastroenterology;  Laterality: N/A;   NECK SURGERY  2005   ROTATOR CUFF REPAIR Right 02/2014   [name]   Patient Active Problem List   Diagnosis Date Noted   UTI (urinary tract infection) 01/11/2022   Acute kidney injury superimposed on chronic kidney disease 11/25/2021  Transient hypotension 11/25/2021   Obesity (BMI 30-39.9) 11/25/2021   Leukocytosis 11/25/2021   Bilateral lower extremity edema 11/21/2021   Wheezing 06/11/2021   Acute hearing loss, right 05/16/2021   COVID-19 virus infection 04/23/2021   Colitis 10/12/2020   Gastrointestinal hemorrhage 10/12/2020   Left lower quadrant abdominal pain 10/12/2020   Diabetes mellitus type 2 in obese 10/12/2020   CKD (chronic kidney disease) stage 4, GFR 15-29 ml/min 10/12/2020   Aortic atherosclerosis 08/12/2020   Left-sided face pain 03/27/2020   Left groin pain 09/29/2019   Fatigue 09/29/2019   Syncope 08/04/2018   Pain in finger of left hand 06/23/2018   Mild  aortic stenosis 05/06/2018   Trigger finger, left 04/30/2018   Insomnia 04/30/2018   Thoracic back pain 10/24/2017   Left shoulder pain 04/23/2017   Dysuria 04/04/2017   Bloating symptom 04/04/2017   Acute pain of left shoulder 02/06/2017   Urinary urgency 11/24/2016   Rash 10/05/2016   Bunion of right foot 09/22/2016   Trigger middle finger of left hand 09/22/2016   Arthritis of hand, degenerative 09/22/2016   Left foot pain 02/22/2016   Pain and swelling of right lower leg 02/22/2016   Neck pain 02/01/2016   Postop carotid endarterectomy surveillance, encounter for 12/01/2015   Acute sinus infection 04/27/2015   Left carotid artery stenosis 08/25/2014   Left-sided carotid artery disease 08/10/2014   Stenosis of right carotid artery 07/27/2014   Chronic low back pain 07/14/2014   Neurogenic claudication 04/28/2014   Dizziness and giddiness 11/06/2013   GERD (gastroesophageal reflux disease) 11/06/2013   Influenza 02/21/2013   Paronychia 12/30/2012   Encounter for long-term (current) use of high-risk medication 03/02/2011   Psoriasis 01/03/2011   Encounter for well adult exam with abnormal findings 12/30/2010   Atypical chest pain 09/08/2009   LEG CRAMPS 06/20/2009   Diarrhea 06/20/2009   HEMATOCHEZIA 03/21/2009   Hematochezia 03/21/2009   GANGLION CYST, TENDON SHEATH 11/18/2008   Abdominal pain in female 08/31/2008   Low back pain 06/15/2008   Transient cerebral ischemia 10/14/2007   ESOPHAGITIS 03/13/2007   B12 deficiency 01/09/2007   Hyperlipidemia 12/25/2006   Allergic rhinitis 12/25/2006   COMMON MIGRAINE 12/20/2006   OVARIAN CYST 12/20/2006   Other malaise and fatigue 12/20/2006   Abdominal pain, epigastric 12/20/2006   SHINGLES, HX OF 12/20/2006   CHOLERA, D/T VIBRIO CHOLERAE EL TOR 10/16/2006   Morbid obesity (HCC) 10/16/2006   Anxiety state 10/16/2006   Depression 10/16/2006   Carpal tunnel syndrome 10/16/2006   CKD (chronic kidney disease) stage 3, GFR  30-59 ml/min 10/16/2006   Hypothyroidism 08/15/2006   Essential hypertension 08/15/2006    Onset date: ~1.5 years; 05/16/2022 (referral date)  REFERRING DIAG: R49.0 (ICD-10-CM) - Dysphonia  THERAPY DIAG: Other voice and resonance disorders  Rationale for Evaluation and Treatment: Rehabilitation  SUBJECTIVE:   SUBJECTIVE STATEMENT: "done better with the coughing"  Pt accompanied by: significant other - Harold   PERTINENT HISTORY: Ms. Weyrauch presents today with complaint of hoarseness. Her husband accompanies her on today's visit. She states that this has bothered her for about 1 to 1-1/2 years. She does not smoke. She does have a history of reflux and is on Pepcid twice daily and Protonix daily. Some mild allergies for which she takes Xyzal. She does sing when able. However, she finds that her voice gives out frequently. PHMX: GERD  PAIN: Are you having pain? No  FALLS: Has patient fallen in last 6 months? No  LIVING ENVIRONMENT: Lives  with: lives with their family Lives in: House/apartment  PLOF: Independent  PATIENT GOALS: improve voice and return to singing  OBJECTIVE:   DIAGNOSTIC FINDINGS: Flexible nasal Endoscopy (05/14/22): Slight bowing of the true vocal folds. No nasopharyngeal or laryngeal lesions   COGNITION:Overall cognitive status: Within functional limits for tasks assessed  SOCIAL HISTORY: Occupation: Retired  Counsellor intake: optimal Caffeine/alcohol intake: minimal Daily voice use: moderate  PERCEPTUAL VOICE ASSESSMENT: Voice quality: hoarse, breathy, vocal fatigue, and aphonic Vocal abuse: habitual throat clearing and abnormal breathing pattern Resonance: normal Respiratory function: clavicular breathing   TODAY'S TREATMENT:                                                                                                                                         06-13-22: Reports significant improvements in cough and throat clearing reduction with use of  trained techniques. Good carryover exhibited in today's session with rare cues x2 to employ throat clear alternatives. Educated and instructed abdominal breathing and SOVT exercises to optimize breath support and forward focused phonation to reduce hoarseness. Some atypical tongue thrusting noted when using straw during SOVT trials, in which SLP modified task to rounded lips, which was beneficial. Demonstrated increasing awareness of breath support and vocal quality as tasks progressed. Targeted oral reading with focus on breath support and clear, strong voice with overt benefit on structured task. Pt verbalized awareness of improved vocal quality. Updated HEP to increase abdominal breathing, SOVT, and oral reading to focus on trained techniques.   05-30-22: Educated patient on vocal hygiene protocol due to suspected increased irritation secondary to usual coughing/throat clearing. Handout provided. Plan to educate and train PhoRTE to address slight vocal cord bowing resulting hoarse, breathy vocal quality. Pt verbalized understanding and agreement with SLP POC and recommendations. Denied further questions at this time.    PATIENT EDUCATION: Education details: see above Person educated: Patient and Spouse Education method: Explanation, Demonstration, and Handouts Education comprehension: verbalized understanding, returned demonstration, and needs further education  HOME EXERCISE PROGRAM: Vocal Hygiene, Abdominal Breathing, PhoRTE  GOALS: Goals reviewed with patient? Yes  SHORT TERM GOALS: Target date: 06/27/2022  Pt will carryover HEP as recommended > 1 week Baseline: Goal status: IN PROGRESS  2.  Pt will employ throat clear alternatives and cough suppression techniques to reduce laryngeal irritation for 80% of opportunities given occasional min A over 2 sessions  Baseline: 06-13-22 Goal status: IN PROGRESS  3.  Pt will demo abdominal breathing and exuberant voice on structured tasks for 80%  of opportunities given occasional min A over 2 sessions Baseline: 06-13-22 Goal status: IN PROGRESS  4.  Pt will ID and attempt to self-correct hoarseness in short structured conversation given occasional min A over 2 sessions  Baseline:  Goal status: IN PROGRESS   LONG TERM GOALS: Target date: 08/22/2022  Pt will carryover HEP as recommended > 2 weeks Baseline:  Goal status:  IN PROGRESS  2.  Pt will ID hoarseness and correct with learned techniques in unstructured conversations for 90% of opportunities given rare min A Baseline:  Goal status: IN PROGRESS  3.  Pt will consistently carryover vocal hygiene protocol given rare min A by last ST session  Baseline:  Goal status: IN PROGRESS  4.  Pt will report increased satisfaction with vocal quality via PROM by 2 points at last ST session Baseline: V-RQOL 25 Goal status: IN PROGRESS  ASSESSMENT:  CLINICAL IMPRESSION: Patient is a 67 y.o. F who was seen today for dysphonia. ENT recently scoped and noted slight bowing of vocal cords, which appears age-related. Conducted education and instruction of voice therapy exercises to optimize breath support and vocal quality with overt benefit exhibited on structured tasks today. Pt would continue to benefit from skilled ST intervention to optimize vocal hygiene and maximize vocal cord closure for improved voicing and overall increased communication effectiveness.   OBJECTIVE IMPAIRMENTS: include voice disorder. These impairments are limiting patient from effectively communicating at home and in community. Factors affecting potential to achieve goals and functional outcome are  N/A . Patient will benefit from skilled SLP services to address above impairments and improve overall function.  REHAB POTENTIAL: Good  PLAN:  SLP FREQUENCY: 1x/week  SLP DURATION: 12 weeks  PLANNED INTERVENTIONS: Environmental controls, Cueing hierachy, Internal/external aids, Functional tasks, SLP instruction and  feedback, Compensatory strategies, and Re-evaluation    Gracy Racer, CCC-SLP 06/13/2022, 11:37 AM

## 2022-06-14 DIAGNOSIS — H5203 Hypermetropia, bilateral: Secondary | ICD-10-CM | POA: Diagnosis not present

## 2022-06-14 LAB — HM DIABETES EYE EXAM

## 2022-06-18 ENCOUNTER — Other Ambulatory Visit: Payer: Self-pay

## 2022-06-18 ENCOUNTER — Encounter: Payer: Self-pay | Admitting: Internal Medicine

## 2022-06-19 NOTE — Therapy (Unsigned)
OUTPATIENT SPEECH LANGUAGE PATHOLOGY VOICE TREATMENT   Patient Name: Heather Dawson MRN: [id number] DOB:Jul 09, 1955, 67 y.o., female Today's Date: 06/20/2022  PCP: Corwin Levins MD REFERRING PROVIDER: Vivianne Master, PA  END OF SESSION:  End of Session - 06/20/22 1105     Visit Number 3    Number of Visits 13    Date for SLP Re-Evaluation 08/22/22    Authorization Type [employer] $10 copay    SLP Start Time 1018    SLP Stop Time  1049    SLP Time Calculation (min) 31 min    Activity Tolerance Patient tolerated treatment well               Past Medical History:  Diagnosis Date   Abdominal pain, epigastric 12/20/2006   ALLERGIC RHINITIS 12/25/2006   Allergy    ANXIETY 10/16/2006   no per pt   Arthritis    right shoulder   BACK PAIN 06/15/2008   Blood transfusion without reported diagnosis    BRADYCARDIA 05/04/2008   C. difficile diarrhea    Carpal tunnel syndrome 10/16/2006   Cellulitis and abscess of other specified site 06/15/2008   CHEST PAIN 09/08/2009   Chronic low back pain    CIRRHOSIS 10/16/2006   CKD (chronic kidney disease)    2008- had Ecoli and caused renal failure, no problems since then   CKD (chronic kidney disease) stage 3, GFR 30-59 ml/min (HCC) 10/16/2006   Qualifier: Diagnosis of  By: Jonny Ruiz MD, Len Blalock    COMMON MIGRAINE 12/20/2006   Complication of anesthesia    awake during 2 surgeries   CONTUSION, LOWER LEG 09/15/2008   DEPRESSION 10/16/2006   no per pt   Diabetes mellitus without complication (HCC)    Esophageal reflux 12/25/2006   HYPERLIPIDEMIA 12/25/2006   Hyperlipidemia    HYPERTENSION 08/15/2006   Hypertension    HYPOTHYROIDISM 08/15/2006   Kidney failure, acute (HCC) 2009   as a result of severe E Coli infection   Left-sided carotid artery disease (HCC)    MIGRAINE HEADACHE 10/16/2006   Obesity 10/16/2006   OVARIAN CYST 12/20/2006   Psoriasis 01/03/2011   SHINGLES, HX OF 12/20/2006   Stroke (HCC)    3 strokes 2008-no deficits, only on ASA    TRANSIENT ISCHEMIC ATTACKS, HX OF 12/20/2006   VERTIGO 09/15/2007   VITAMIN B12 DEFICIENCY 01/09/2007   Past Surgical History:  Procedure Laterality Date   ABDOMINAL HYSTERECTOMY  02/2006   ANTERIOR CERVICAL DECOMP/DISCECTOMY FUSION  2005   BIOPSY  11/27/2021   Procedure: BIOPSY;  Surgeon: Benancio Deeds, MD;  Location: [facility];  Service: Gastroenterology;;   CESAREAN SECTION     CHOLECYSTECTOMY     COLONOSCOPY     COLONOSCOPY WITH PROPOFOL N/A 11/27/2021   Procedure: COLONOSCOPY WITH PROPOFOL;  Surgeon: Benancio Deeds, MD;  Location: [facility];  Service: Gastroenterology;  Laterality: N/A;   ENDARTERECTOMY Left 08/25/2014   Procedure: LEFT CAROTID ENDARTERECTOMY WITH HEMASHIELD PATCH ANGIOPLASTY;  Surgeon: Sherren Kerns, MD;  Location: [facility] OR;  Service: Vascular;  Laterality: Left;   ESOPHAGOGASTRODUODENOSCOPY (EGD) WITH PROPOFOL N/A 11/27/2021   Procedure: ESOPHAGOGASTRODUODENOSCOPY (EGD) WITH PROPOFOL;  Surgeon: Benancio Deeds, MD;  Location: [facility] ENDOSCOPY;  Service: Gastroenterology;  Laterality: N/A;   NECK SURGERY  2005   ROTATOR CUFF REPAIR Right 02/2014   [name]   Patient Active Problem List   Diagnosis Date Noted   UTI (urinary tract infection) 01/11/2022   Acute kidney injury superimposed  on chronic kidney disease (HCC) 11/25/2021   Transient hypotension 11/25/2021   Obesity (BMI 30-39.9) 11/25/2021   Leukocytosis 11/25/2021   Bilateral lower extremity edema 11/21/2021   Wheezing 06/11/2021   Acute hearing loss, right 05/16/2021   COVID-19 virus infection 04/23/2021   Colitis 10/12/2020   Gastrointestinal hemorrhage 10/12/2020   Left lower quadrant abdominal pain 10/12/2020   Diabetes mellitus type 2 in obese 10/12/2020   CKD (chronic kidney disease) stage 4, GFR 15-29 ml/min (HCC) 10/12/2020   Aortic atherosclerosis (HCC) 08/12/2020   Left-sided face pain 03/27/2020   Left groin pain 09/29/2019   Fatigue 09/29/2019   Syncope 08/04/2018    Pain in finger of left hand 06/23/2018   Mild aortic stenosis 05/06/2018   Trigger finger, left 04/30/2018   Insomnia 04/30/2018   Thoracic back pain 10/24/2017   Left shoulder pain 04/23/2017   Dysuria 04/04/2017   Bloating symptom 04/04/2017   Acute pain of left shoulder 02/06/2017   Urinary urgency 11/24/2016   Rash 10/05/2016   Bunion of right foot 09/22/2016   Trigger middle finger of left hand 09/22/2016   Arthritis of hand, degenerative 09/22/2016   Left foot pain 02/22/2016   Pain and swelling of right lower leg 02/22/2016   Neck pain 02/01/2016   Postop carotid endarterectomy surveillance, encounter for 12/01/2015   Acute sinus infection 04/27/2015   Left carotid artery stenosis 08/25/2014   Left-sided carotid artery disease (HCC) 08/10/2014   Stenosis of right carotid artery 07/27/2014   Chronic low back pain 07/14/2014   Neurogenic claudication 04/28/2014   Dizziness and giddiness 11/06/2013   GERD (gastroesophageal reflux disease) 11/06/2013   Influenza 02/21/2013   Paronychia 12/30/2012   Encounter for long-term (current) use of high-risk medication 03/02/2011   Psoriasis 01/03/2011   Encounter for well adult exam with abnormal findings 12/30/2010   Atypical chest pain 09/08/2009   LEG CRAMPS 06/20/2009   Diarrhea 06/20/2009   HEMATOCHEZIA 03/21/2009   Hematochezia 03/21/2009   GANGLION CYST, TENDON SHEATH 11/18/2008   Abdominal pain in female 08/31/2008   Low back pain 06/15/2008   Transient cerebral ischemia 10/14/2007   ESOPHAGITIS 03/13/2007   B12 deficiency 01/09/2007   Hyperlipidemia 12/25/2006   Allergic rhinitis 12/25/2006   COMMON MIGRAINE 12/20/2006   OVARIAN CYST 12/20/2006   Other malaise and fatigue 12/20/2006   Abdominal pain, epigastric 12/20/2006   SHINGLES, HX OF 12/20/2006   CHOLERA, D/T VIBRIO CHOLERAE EL TOR 10/16/2006   Morbid obesity (HCC) 10/16/2006   Anxiety state 10/16/2006   Depression 10/16/2006   Carpal tunnel syndrome  10/16/2006   CKD (chronic kidney disease) stage 3, GFR 30-59 ml/min (HCC) 10/16/2006   Hypothyroidism 08/15/2006   Essential hypertension 08/15/2006    Onset date: ~1.5 years; 05/16/2022 (referral date)  REFERRING DIAG: R49.0 (ICD-10-CM) - Dysphonia  THERAPY DIAG: Other voice and resonance disorders  Rationale for Evaluation and Treatment: Rehabilitation  SUBJECTIVE:   SUBJECTIVE STATEMENT: "I had a good week with the breathing and singing but today may have to be last week" Pt accompanied by: significant other - Harold   PERTINENT HISTORY: Ms. Stalker presents today with complaint of hoarseness. Her husband accompanies her on today's visit. She states that this has bothered her for about 1 to 1-1/2 years. She does not smoke. She does have a history of reflux and is on Pepcid twice daily and Protonix daily. Some mild allergies for which she takes Xyzal. She does sing when able. However, she finds that her voice gives out  frequently. PHMX: GERD  PAIN: Are you having pain? No  FALLS: Has patient fallen in last 6 months? 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 No nasopharyngeal or laryngeal lesions   COGNITION:Overall cognitive status: Within functional limits for tasks assessed  SOCIAL HISTORY: Occupation: Retired  Counsellor intake: optimal Caffeine/alcohol intake: minimal Daily voice use: moderate  PERCEPTUAL VOICE ASSESSMENT: Voice quality: hoarse, breathy, vocal fatigue, and aphonic Vocal abuse: habitual throat clearing and abnormal breathing pattern Resonance: normal Respiratory function: clavicular breathing   TODAY'S TREATMENT:                                                                                                                                          06-20-22: Pt enters with clear vocal quality and improved breath support in conversation. Reported good carryover of abdominal breath support and forward focused phonation outside of therapy, with success reported for both speaking and singing. Given upcoming PT evaluation and unknown financial obligations, pt may request to cancel remaining appointments; therefore, provided introductory education and instruction of PhoRTE exercises to elicit exuberant voice d/t vocal cord bowing. Pt may benefit from additional education and instruction of PhoRTE program to optimize accuracy and completion.   06-13-22: Reports significant improvements in cough and throat clearing reduction with use of trained techniques. Good carryover exhibited in today's session with rare cues x2 to employ throat clear alternatives. Educated and instructed abdominal breathing and SOVT exercises to optimize breath support and forward focused phonation to reduce hoarseness. Some atypical tongue thrusting noted when using straw during SOVT trials, in which SLP modified task to rounded lips, which was beneficial. Demonstrated increasing awareness of breath support and vocal quality as tasks progressed. Targeted oral reading with focus on breath support and clear, strong voice with overt benefit on structured task. Pt verbalized awareness of improved vocal quality. Updated HEP to increase abdominal breathing, SOVT, and oral reading to focus on trained techniques.   05-30-22: Educated patient on vocal hygiene protocol due to suspected increased irritation secondary to usual coughing/throat clearing. Handout provided. Plan to educate and train PhoRTE to address slight vocal cord bowing resulting hoarse, breathy vocal quality. Pt verbalized understanding and agreement with SLP POC and recommendations. Denied further questions at this time.    PATIENT EDUCATION: Education details: see above Person educated: Patient and  Spouse Education method: Explanation, Demonstration, and Handouts Education comprehension: verbalized understanding, returned demonstration, and needs further education  HOME EXERCISE PROGRAM: Vocal Hygiene, Abdominal Breathing, PhoRTE  GOALS: Goals reviewed with patient? Yes  SHORT TERM GOALS: Target date: 06/27/2022  Pt will carryover HEP as recommended > 1 week Baseline: Goal status: MET  2.  Pt will employ throat clear alternatives  and cough suppression techniques to reduce laryngeal irritation for 80% of opportunities given occasional min A over 2 sessions  Baseline: 06-13-22, 06-20-22 Goal status: MET  3.  Pt will demo abdominal breathing and exuberant voice on structured tasks for 80% of opportunities given occasional min A over 2 sessions Baseline: 06-13-22, 06-20-22 Goal status: MET  4.  Pt will ID and attempt to self-correct hoarseness in short structured conversation given occasional min A over 2 sessions  Baseline: 06-20-22 Goal status: IN PROGRESS   LONG TERM GOALS: Target date: 08/22/2022  Pt will carryover HEP as recommended > 2 weeks Baseline:  Goal status: IN PROGRESS  2.  Pt will ID hoarseness and correct with learned techniques in unstructured conversations for 90% of opportunities given rare min A Baseline:  Goal status: IN PROGRESS  3.  Pt will consistently carryover vocal hygiene protocol given rare min A by last ST session  Baseline:  Goal status: IN PROGRESS  4.  Pt will report increased satisfaction with vocal quality via PROM by 2 points at last ST session Baseline: V-RQOL= 25 Goal status: IN PROGRESS  ASSESSMENT:  CLINICAL IMPRESSION: Patient is a 67 y.o. F who was seen today for dysphonia. ENT recently scoped and noted slight bowing of vocal cords, which appears age-related. Conducted education and instruction of voice therapy exercises to optimize breath support and vocal quality with overt benefit exhibited on structured tasks and conversation  today. Pt would continue to benefit from skilled ST intervention to optimize vocal hygiene and maximize vocal cord closure for improved voicing and overall increased communication effectiveness.   OBJECTIVE IMPAIRMENTS: include voice disorder. These impairments are limiting patient from effectively communicating at home and in community. Factors affecting potential to achieve goals and functional outcome are  N/A . Patient will benefit from skilled SLP services to address above impairments and improve overall function.  REHAB POTENTIAL: Good  PLAN:  SLP FREQUENCY: 1x/week  SLP DURATION: 12 weeks  PLANNED INTERVENTIONS: Environmental controls, Cueing hierachy, Internal/external aids, Functional tasks, SLP instruction and feedback, Compensatory strategies, and Re-evaluation    Gracy Racer, CCC-SLP 06/20/2022, 11:06 AM

## 2022-06-20 ENCOUNTER — Ambulatory Visit: Payer: Medicare Other | Attending: Physician Assistant

## 2022-06-20 DIAGNOSIS — R498 Other voice and resonance disorders: Secondary | ICD-10-CM | POA: Diagnosis not present

## 2022-06-20 NOTE — Patient Instructions (Addendum)
Primary Focus:  Reading aloud 5-10 minutes/day Conversation/Singing: focus on breath support and projection (strong, clear voice)  Straw phonation 2 minutes prior to PHoRTE and throughout the day and before any prolonged speaking  PHoRTE - twice a day  [email or web address]   10 Loud AH's as loud as you can and as long as you can  2. 10 Pitch glides up, 10 pitch glides down   3. 10 sentences in loud high pitch voice, like you are calling your neighbor over the phone  4. 10 sentences in loud low authoritative pitch, like you are the boss  Use a good belly breath before each exercise- feel your abs contract in as you use your voice  Use a good abdominal breath to power your voice when talking  Keep reducing throat clears

## 2022-06-25 ENCOUNTER — Other Ambulatory Visit: Payer: Self-pay | Admitting: Internal Medicine

## 2022-06-25 DIAGNOSIS — M5136 Other intervertebral disc degeneration, lumbar region: Secondary | ICD-10-CM | POA: Diagnosis not present

## 2022-06-25 DIAGNOSIS — M6281 Muscle weakness (generalized): Secondary | ICD-10-CM | POA: Diagnosis not present

## 2022-06-25 DIAGNOSIS — M5416 Radiculopathy, lumbar region: Secondary | ICD-10-CM | POA: Diagnosis not present

## 2022-06-27 ENCOUNTER — Ambulatory Visit: Payer: Medicare Other

## 2022-06-28 ENCOUNTER — Encounter: Payer: Self-pay | Admitting: Internal Medicine

## 2022-06-29 ENCOUNTER — Other Ambulatory Visit: Payer: Self-pay

## 2022-07-03 DIAGNOSIS — M5416 Radiculopathy, lumbar region: Secondary | ICD-10-CM | POA: Diagnosis not present

## 2022-07-03 DIAGNOSIS — M6281 Muscle weakness (generalized): Secondary | ICD-10-CM | POA: Diagnosis not present

## 2022-07-03 DIAGNOSIS — M5136 Other intervertebral disc degeneration, lumbar region: Secondary | ICD-10-CM | POA: Diagnosis not present

## 2022-07-03 LAB — AMB REFERRAL TO ENT
Albumin, Urine POC: 47
Creatinine, POC: 137.5 mg/dL
EGFR (Non-African Amer.): 31

## 2022-07-04 ENCOUNTER — Ambulatory Visit: Payer: Medicare Other

## 2022-07-04 NOTE — Therapy (Signed)
[facility] [address] [place], [place], [address] Phone: [phone]   Fax:  [phone]  Patient Details  Name: Heather Dawson MRN: [id number] Date of Birth: 1955-05-15 Referring Provider:  Vivianne Master, [place]   Encounter Date: 07/04/2022  SPEECH THERAPY DISCHARGE SUMMARY  Visits from Start of Care: 3  Current functional level related to goals / functional outcomes: Pt cancelled remaining appointments d/t improvements in voicing given ST education and intervention   Remaining deficits: Suspect none   Education / Equipment: Voice hygiene protocol, SOVT, throat clear alternatives, PhoRTE   Patient agrees to discharge. Patient goals were met. Patient is being discharged due to the patient's request.  SHORT TERM GOALS: Target date: 06/27/2022   Pt will carryover HEP as recommended > 1 week Baseline: Goal status: MET   2.  Pt will employ throat clear alternatives and cough suppression techniques to reduce laryngeal irritation for 80% of opportunities given occasional min A over 2 sessions  Baseline: 06-13-22, 06-20-22 Goal status: MET   3.  Pt will demo abdominal breathing and exuberant voice on structured tasks for 80% of opportunities given occasional min A over 2 sessions Baseline: 06-13-22, 06-20-22 Goal status: MET   4.  Pt will ID and attempt to self-correct hoarseness in short structured conversation given occasional min A over 2 sessions  Baseline: 06-20-22 Goal status: PARTIALLY MET     LONG TERM GOALS: Target date: 08/22/2022   Pt will carryover HEP as recommended > 2 weeks Baseline:  Goal status: MET   2.  Pt will ID hoarseness and correct with learned techniques in unstructured conversations for 90% of opportunities given rare min A Baseline:  Goal status: MET   3.  Pt will consistently carryover vocal hygiene protocol given rare min A by last ST session  Baseline:  Goal status: MET   4.  Pt will report increased  satisfaction with vocal quality via PROM by 2 points at last ST session Baseline: V-RQOL= 25 Goal status: MET      Gracy Racer, CCC-SLP 07/04/2022, 12:42 PM  [facility] [address] [place], [place], [address] Phone: [phone]   Fax:  [phone]

## 2022-07-10 DIAGNOSIS — M6281 Muscle weakness (generalized): Secondary | ICD-10-CM | POA: Diagnosis not present

## 2022-07-10 DIAGNOSIS — M5416 Radiculopathy, lumbar region: Secondary | ICD-10-CM | POA: Diagnosis not present

## 2022-07-10 DIAGNOSIS — M5136 Other intervertebral disc degeneration, lumbar region: Secondary | ICD-10-CM | POA: Diagnosis not present

## 2022-07-11 ENCOUNTER — Ambulatory Visit: Payer: Medicare Other

## 2022-07-17 DIAGNOSIS — M5136 Other intervertebral disc degeneration, lumbar region: Secondary | ICD-10-CM | POA: Diagnosis not present

## 2022-07-18 ENCOUNTER — Ambulatory Visit: Payer: Medicare Other

## 2022-07-27 ENCOUNTER — Other Ambulatory Visit: Payer: Self-pay

## 2022-07-27 ENCOUNTER — Other Ambulatory Visit: Payer: Self-pay | Admitting: Internal Medicine

## 2022-07-28 ENCOUNTER — Other Ambulatory Visit: Payer: Self-pay | Admitting: Internal Medicine

## 2022-08-03 ENCOUNTER — Encounter: Payer: Self-pay | Admitting: Internal Medicine

## 2022-08-06 ENCOUNTER — Telehealth: Payer: Self-pay

## 2022-08-06 ENCOUNTER — Other Ambulatory Visit: Payer: Self-pay | Admitting: Internal Medicine

## 2022-08-06 ENCOUNTER — Other Ambulatory Visit (HOSPITAL_COMMUNITY): Payer: Self-pay

## 2022-08-06 MED ORDER — OZEMPIC (0.25 OR 0.5 MG/DOSE) 2 MG/3ML ~~LOC~~ SOPN
PEN_INJECTOR | SUBCUTANEOUS | 11 refills | Status: DC
Start: 2022-08-06 — End: 2022-08-10

## 2022-08-06 NOTE — Telephone Encounter (Signed)
PA has been APPROVED from 08/06/2022-08/06/2023

## 2022-08-06 NOTE — Telephone Encounter (Signed)
*  Primary   PA request received for Ozempic (0.25 or 0.5 MG/DOSE) 2MG /3ML pen-injectors  PA submitted to [facility] Medicare and is pending determination  *patient has type 2 diabetes, multiple cardiovascular risks/conditions  Key: [id number]

## 2022-08-06 NOTE — Telephone Encounter (Signed)
Pa for Ozempic has been submitted and is pending determination, will be updated in additional encounter.

## 2022-08-06 NOTE — Telephone Encounter (Signed)
Patient is requesting a PA to be started for the [facility]

## 2022-08-10 ENCOUNTER — Other Ambulatory Visit: Payer: Self-pay

## 2022-08-10 ENCOUNTER — Encounter: Payer: Self-pay | Admitting: Internal Medicine

## 2022-08-10 MED ORDER — OZEMPIC (0.25 OR 0.5 MG/DOSE) 2 MG/3ML ~~LOC~~ SOPN: PEN_INJECTOR | SUBCUTANEOUS | 11 refills | Status: DC

## 2022-08-14 DIAGNOSIS — E1122 Type 2 diabetes mellitus with diabetic chronic kidney disease: Secondary | ICD-10-CM | POA: Diagnosis not present

## 2022-08-14 DIAGNOSIS — N183 Chronic kidney disease, stage 3 unspecified: Secondary | ICD-10-CM | POA: Diagnosis not present

## 2022-08-14 DIAGNOSIS — E039 Hypothyroidism, unspecified: Secondary | ICD-10-CM | POA: Diagnosis not present

## 2022-08-14 DIAGNOSIS — I129 Hypertensive chronic kidney disease with stage 1 through stage 4 chronic kidney disease, or unspecified chronic kidney disease: Secondary | ICD-10-CM | POA: Diagnosis not present

## 2022-08-15 LAB — LAB REPORT - SCANNED
Albumin, Urine POC: 46.5
Albumin/Creatinine Ratio, Urine, POC: 50
Creatinine, POC: 92.5 mg/dL
EGFR: 26

## 2022-08-28 DIAGNOSIS — L400 Psoriasis vulgaris: Secondary | ICD-10-CM | POA: Diagnosis not present

## 2022-08-28 DIAGNOSIS — C44622 Squamous cell carcinoma of skin of right upper limb, including shoulder: Secondary | ICD-10-CM | POA: Diagnosis not present

## 2022-08-28 DIAGNOSIS — L570 Actinic keratosis: Secondary | ICD-10-CM | POA: Diagnosis not present

## 2022-08-30 IMAGING — MR MR LUMBAR SPINE W/O CM
4 of 5 series · 26 of 48 positions shown · non-contrast
Comparison: CT abdomen and pelvis 10/10/2010.

CLINICAL DATA: Low back and right buttock pain for 1 year. No known
injury.

EXAM:
MRI LUMBAR SPINE WITHOUT CONTRAST
TECHNIQUE: Multiplanar, multisequence MR imaging of the lumbar spine was
performed. No intravenous contrast was administered.

[Series 3: T2 · sagittal · 4.0mm · 0.53mm/px · 6 of 16 slices shown (1 of 2)]
[im 1/16]
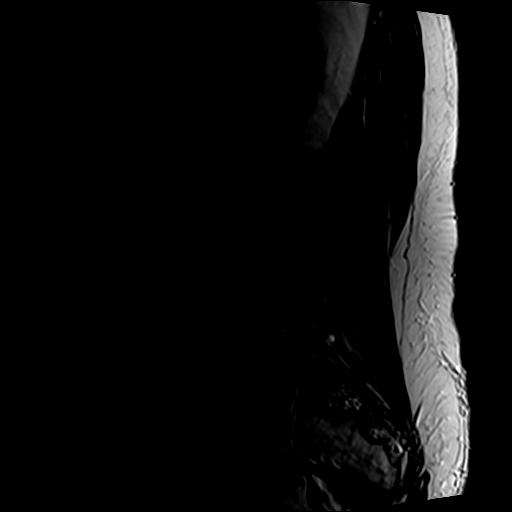
[im 4/16]
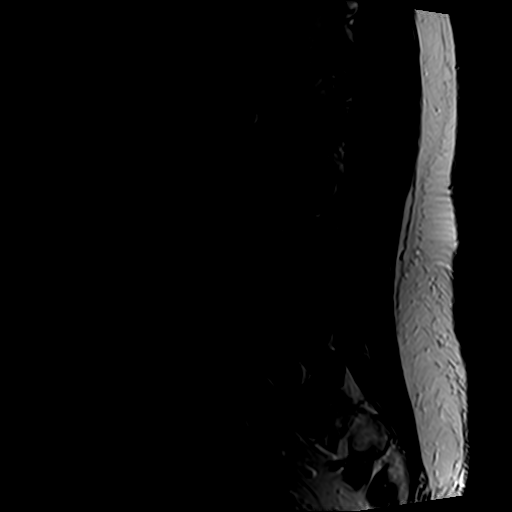
[im 7/16]
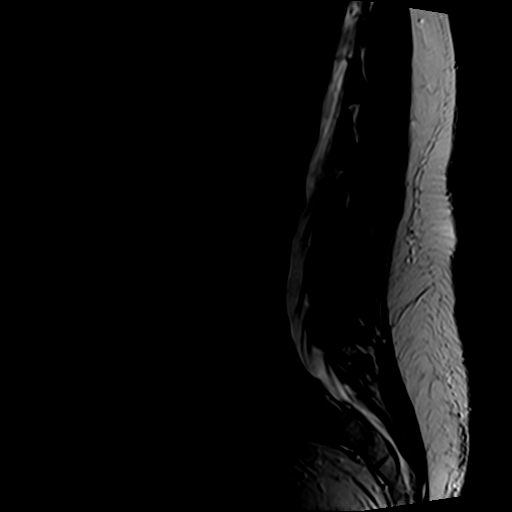
[im 10/16]
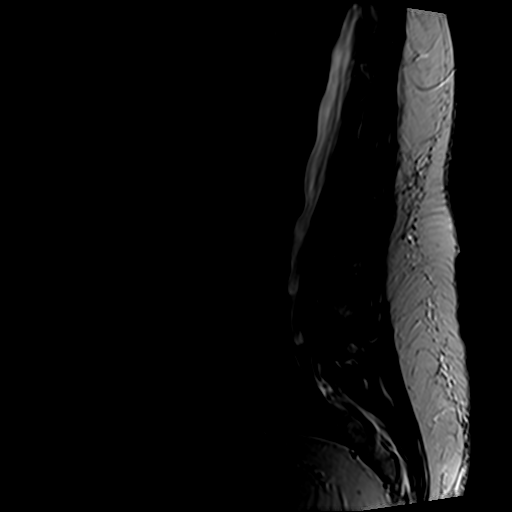
[im 13/16]
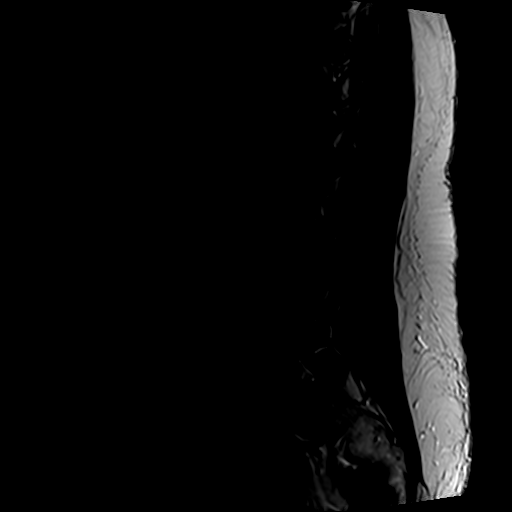
[im 16/16]
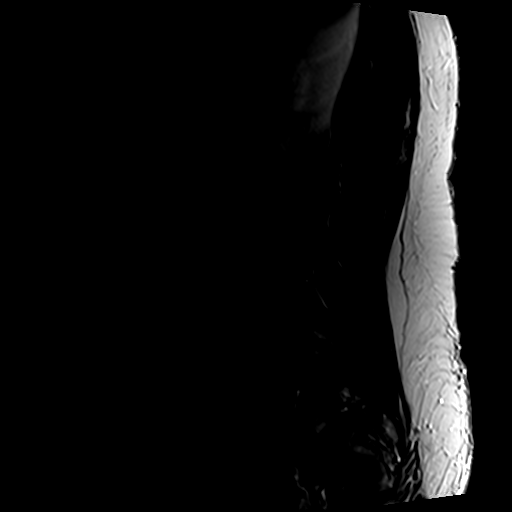

[Series 5: T1 · sagittal · 4.0mm · 0.53mm/px · 6 of 16 slices shown (1 of 2)]
[im 1/16]
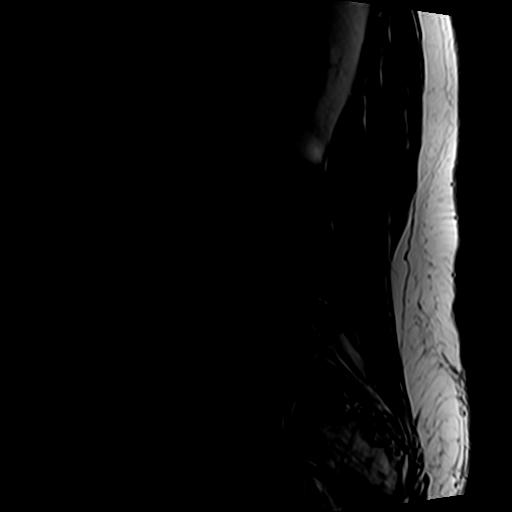
[im 4/16]
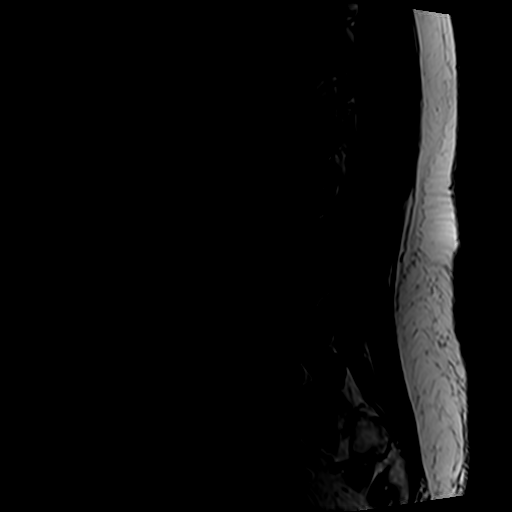
[im 7/16]
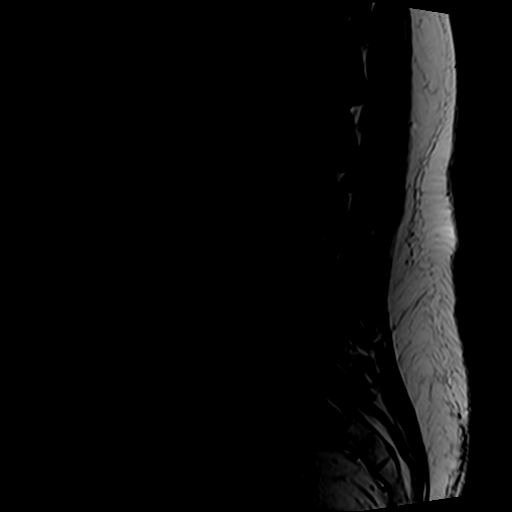
[im 10/16]
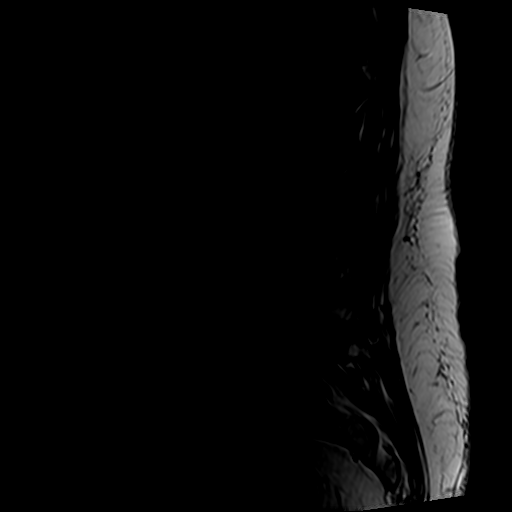
[im 13/16]
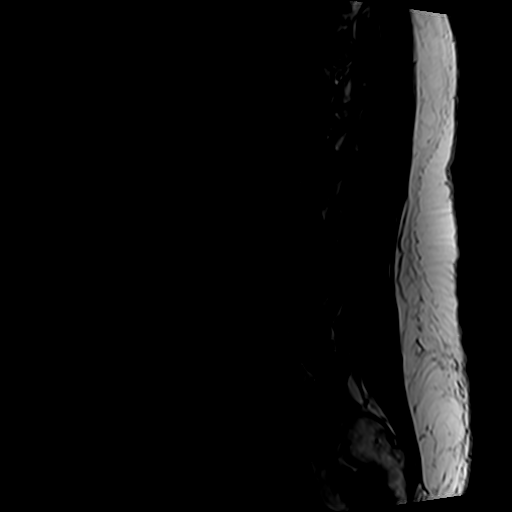
[im 16/16]
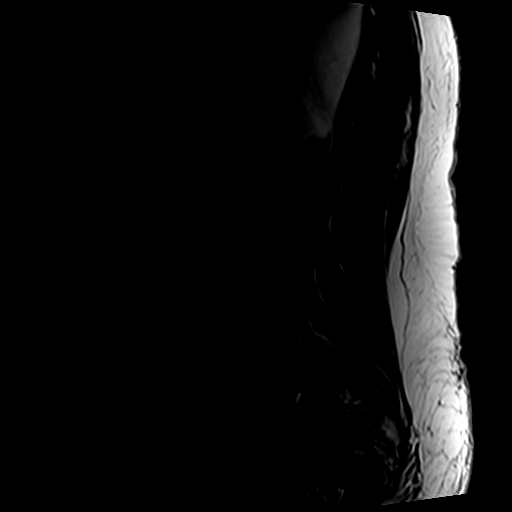

[Series 6: T2 · axial · 4.0mm · 0.70mm/px · z∈[-57,+159]mm · 9 of 38 slices shown (2 of 2)]
[im 1/38]
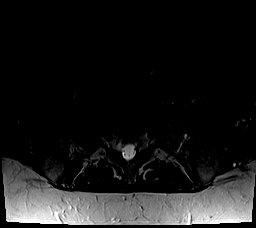
[im 6/38]
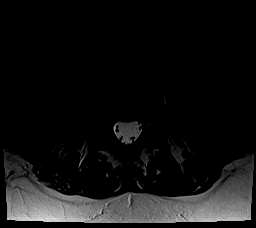
[im 11/38]
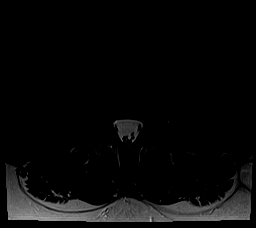
[im 16/38]
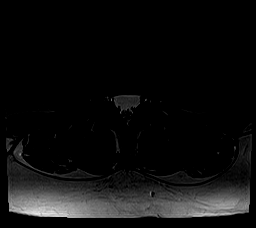
[im 19/38]
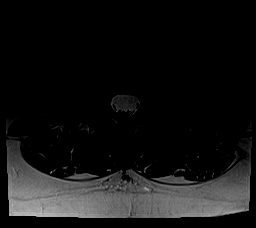
[im 22/38]
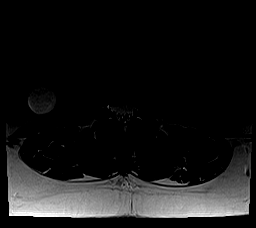
[im 27/38]
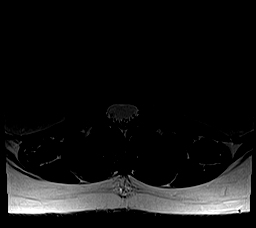
[im 32/38]
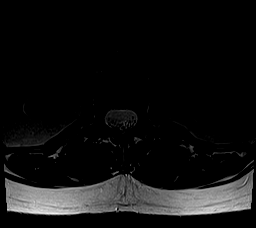
[im 38/38]
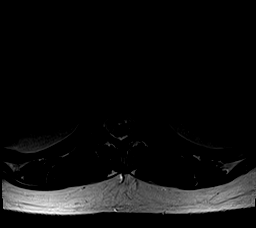

[Series 7: T1 · axial · 4.0mm · 0.35mm/px · z∈[-57,+128]mm · 5 of 38 slices shown (2 of 2)]
[im 1/38]
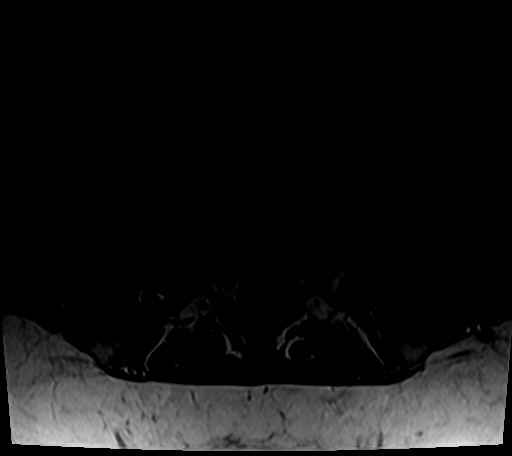
[im 6/38]
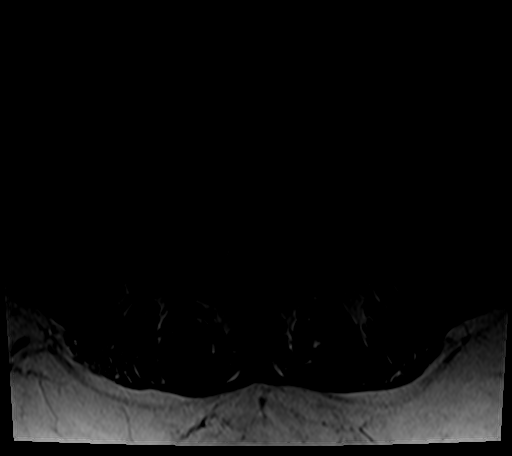
[im 11/38]
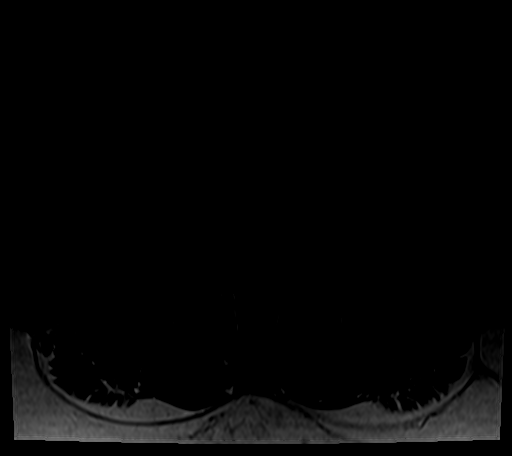
[im 19/38]
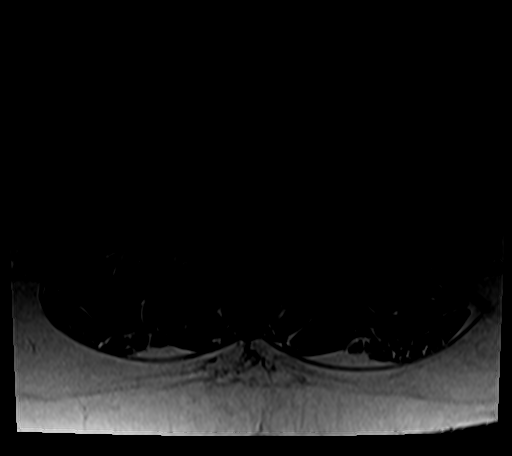
[im 32/38]
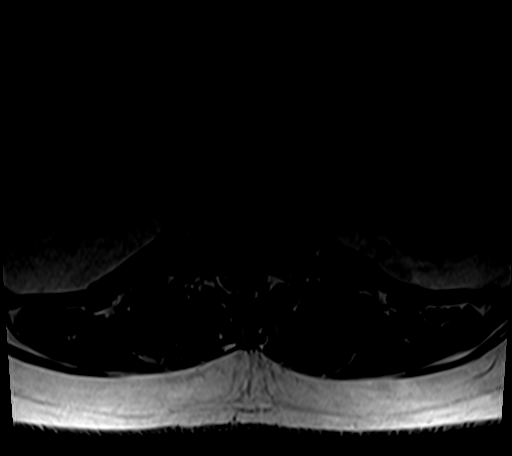

[26 of 48 positions shown; findings below may reference images not displayed]

FINDINGS: Segmentation:  Standard.

Alignment:  Normal.

Vertebrae:  No fracture, evidence of discitis, or bone lesion.

Conus medullaris and cauda equina: Conus extends to the L1 level.
Conus and cauda equina appear normal.

Paraspinal and other soft tissues: There is right renal atrophy
which is unchanged since the patient's 4594 CT. Small cyst in lower
pole the right kidney is noted.

Disc levels:

T11-12 is imaged in the sagittal plane only and negative.

T12-L1: Negative.

L1-2: Negative.

L2-3: Shallow disc bulge without central canal or foraminal
stenosis.

L3-4: Shallow disc bulge without stenosis.

L4-5: Mild facet degenerative change and a very shallow disc bulge.
No stenosis.

L5-S1: Negative.
IMPRESSION: Mild degenerative change without central canal or foraminal
stenosis. No finding to explain the patient's symptoms.

## 2022-09-19 ENCOUNTER — Encounter (INDEPENDENT_AMBULATORY_CARE_PROVIDER_SITE_OTHER): Payer: Self-pay

## 2022-09-24 DIAGNOSIS — C44622 Squamous cell carcinoma of skin of right upper limb, including shoulder: Secondary | ICD-10-CM | POA: Diagnosis not present

## 2022-09-25 ENCOUNTER — Encounter: Payer: Self-pay | Admitting: Internal Medicine

## 2022-10-08 ENCOUNTER — Encounter: Payer: Self-pay | Admitting: Internal Medicine

## 2022-10-08 ENCOUNTER — Ambulatory Visit (INDEPENDENT_AMBULATORY_CARE_PROVIDER_SITE_OTHER): Payer: Medicare Other | Admitting: Internal Medicine

## 2022-10-08 VITALS — BP 126/78 | HR 67 | Temp 98.3°F | Ht 62.0 in | Wt 183.0 lb

## 2022-10-08 DIAGNOSIS — N184 Chronic kidney disease, stage 4 (severe): Secondary | ICD-10-CM | POA: Diagnosis not present

## 2022-10-08 DIAGNOSIS — E669 Obesity, unspecified: Secondary | ICD-10-CM

## 2022-10-08 DIAGNOSIS — E119 Type 2 diabetes mellitus without complications: Secondary | ICD-10-CM

## 2022-10-08 DIAGNOSIS — Z23 Encounter for immunization: Secondary | ICD-10-CM

## 2022-10-08 DIAGNOSIS — E782 Mixed hyperlipidemia: Secondary | ICD-10-CM

## 2022-10-08 DIAGNOSIS — E1165 Type 2 diabetes mellitus with hyperglycemia: Secondary | ICD-10-CM | POA: Diagnosis not present

## 2022-10-08 DIAGNOSIS — E559 Vitamin D deficiency, unspecified: Secondary | ICD-10-CM

## 2022-10-08 DIAGNOSIS — E039 Hypothyroidism, unspecified: Secondary | ICD-10-CM | POA: Diagnosis not present

## 2022-10-08 DIAGNOSIS — R7989 Other specified abnormal findings of blood chemistry: Secondary | ICD-10-CM

## 2022-10-08 DIAGNOSIS — E538 Deficiency of other specified B group vitamins: Secondary | ICD-10-CM | POA: Diagnosis not present

## 2022-10-08 DIAGNOSIS — I10 Essential (primary) hypertension: Secondary | ICD-10-CM | POA: Diagnosis not present

## 2022-10-08 DIAGNOSIS — Z7985 Long-term (current) use of injectable non-insulin antidiabetic drugs: Secondary | ICD-10-CM

## 2022-10-08 DIAGNOSIS — E1169 Type 2 diabetes mellitus with other specified complication: Secondary | ICD-10-CM

## 2022-10-08 LAB — LIPID PANEL
Cholesterol: 151 mg/dL (ref 0–200)
HDL: 37.9 mg/dL — ABNORMAL LOW (ref >39.00)
LDL Cholesterol: 74 mg/dL (ref 0–99)
NonHDL: 113.13
Total CHOL/HDL Ratio: 4
Triglycerides: 197 mg/dL — ABNORMAL HIGH (ref 0.0–149.0)
VLDL: 39.4 mg/dL (ref 0.0–40.0)

## 2022-10-08 LAB — BASIC METABOLIC PANEL
BUN: 25 mg/dL — ABNORMAL HIGH (ref 6–23)
CO2: 24 mEq/L (ref 19–32)
Calcium: 9.6 mg/dL (ref 8.4–10.5)
Chloride: 107 mEq/L (ref 96–112)
Creatinine, Ser: 1.65 mg/dL — ABNORMAL HIGH (ref 0.40–1.20)
GFR: 32.33 mL/min — ABNORMAL LOW (ref >60.00)
Glucose, Bld: 105 mg/dL — ABNORMAL HIGH (ref 70–99)
Potassium: 4 mEq/L (ref 3.5–5.1)
Sodium: 142 mEq/L (ref 135–145)

## 2022-10-08 LAB — T4, FREE: Free T4: 1.47 ng/dL (ref 0.60–1.60)

## 2022-10-08 LAB — HEPATIC FUNCTION PANEL
ALT: 13 U/L (ref 0–35)
AST: 15 U/L (ref 0–37)
Albumin: 4.5 g/dL (ref 3.5–5.2)
Alkaline Phosphatase: 51 U/L (ref 39–117)
Bilirubin, Direct: 0.1 mg/dL (ref 0.0–0.3)
Total Bilirubin: 0.4 mg/dL (ref 0.2–1.2)
Total Protein: 7 g/dL (ref 6.0–8.3)

## 2022-10-08 LAB — VITAMIN B12: Vitamin B-12: 138 pg/mL — ABNORMAL LOW (ref 211–911)

## 2022-10-08 LAB — HEMOGLOBIN A1C: Hgb A1c MFr Bld: 5.8 % (ref 4.6–6.5)

## 2022-10-08 LAB — TSH: TSH: 2.72 u[IU]/mL (ref 0.35–5.50)

## 2022-10-08 MED ORDER — SEMAGLUTIDE (1 MG/DOSE) 4 MG/3ML ~~LOC~~ SOPN: 1.0000 mg | PEN_INJECTOR | SUBCUTANEOUS | 11 refills | Status: DC

## 2022-10-08 NOTE — Patient Instructions (Addendum)
You had the Prevnar 20 pneumonia shot today  Ok to increase the ozempic to 1 mg weekly   Please take OTC B12 1000 mcg per day for at least 6 months  Please continue all other medications as before, and refills have been done if requested.  Please have the pharmacy call with any other refills you may need.  Please continue your efforts at being more active, low cholesterol diet, and weight control.  Please keep your appointments with your specialists as you may have planned  Please go to the LAB at the blood drawing area for the tests to be done  You will be contacted by phone if any changes need to be made immediately.  Otherwise, you will receive a letter about your results with an explanation, but please check with MyChart first.  Please make an Appointment to return in 6 months, or sooner if needed, also with Lab Appointment for testing done 3-5 days before at the FIRST FLOOR Lab (so this is for TWO appointments - please see the scheduling desk as you leave)

## 2022-10-08 NOTE — Progress Notes (Unsigned)
Patient ID: Heather Dawson, female   DOB: [birth year], 67 y.o.   MRN: [id number]        Chief Complaint: follow up HTN, HLD and hyperglycemia       HPI:  Heather Dawson is a 67 y.o. female here with c/o          Wt down 5 lbs, overall 13 lbs since ozempic start.   Wt Readings from Last 3 Encounters:  10/08/22 183 lb (83 kg)  05/04/22 188 lb (85.3 kg)  04/09/22 187 lb (84.8 kg)   BP Readings from Last 3 Encounters:  10/08/22 126/78  05/04/22 134/82  04/09/22 130/78         Past Medical History:  Diagnosis Date   Abdominal pain, epigastric 12/20/2006   ALLERGIC RHINITIS 12/25/2006   Allergy    ANXIETY 10/16/2006   no per pt   Arthritis    right shoulder   BACK PAIN 06/15/2008   Blood transfusion without reported diagnosis    BRADYCARDIA 05/04/2008   C. difficile diarrhea    Carpal tunnel syndrome 10/16/2006   Cellulitis and abscess of other specified site 06/15/2008   CHEST PAIN 09/08/2009   Chronic low back pain    CIRRHOSIS 10/16/2006   CKD (chronic kidney disease)    2008- had Ecoli and caused renal failure, no problems since then   CKD (chronic kidney disease) stage 3, GFR 30-59 ml/min (HCC) 10/16/2006   Qualifier: Diagnosis of  By: Jonny Ruiz MD, Len Blalock    COMMON MIGRAINE 12/20/2006   Complication of anesthesia    awake during 2 surgeries   CONTUSION, LOWER LEG 09/15/2008   DEPRESSION 10/16/2006   no per pt   Diabetes mellitus without complication (HCC)    Esophageal reflux 12/25/2006   HYPERLIPIDEMIA 12/25/2006   Hyperlipidemia    HYPERTENSION 08/15/2006   Hypertension    HYPOTHYROIDISM 08/15/2006   Kidney failure, acute (HCC) 2009   as a result of severe E Coli infection   Left-sided carotid artery disease (HCC)    MIGRAINE HEADACHE 10/16/2006   Obesity 10/16/2006   OVARIAN CYST 12/20/2006   Psoriasis 01/03/2011   SHINGLES, HX OF 12/20/2006   Stroke (HCC)    3 strokes 2008-no deficits, only on ASA   TRANSIENT ISCHEMIC ATTACKS, HX OF 12/20/2006   VERTIGO 09/15/2007    VITAMIN B12 DEFICIENCY 01/09/2007   Past Surgical History:  Procedure Laterality Date   ABDOMINAL HYSTERECTOMY  02/2006   ANTERIOR CERVICAL DECOMP/DISCECTOMY FUSION  2005   BIOPSY  11/27/2021   Procedure: BIOPSY;  Surgeon: Benancio Deeds, MD;  Location: [facility] ENDOSCOPY;  Service: Gastroenterology;;   CESAREAN SECTION     CHOLECYSTECTOMY     COLONOSCOPY     COLONOSCOPY WITH PROPOFOL N/A 11/27/2021   Procedure: COLONOSCOPY WITH PROPOFOL;  Surgeon: Benancio Deeds, MD;  Location: [facility] ENDOSCOPY;  Service: Gastroenterology;  Laterality: N/A;   ENDARTERECTOMY Left 08/25/2014   Procedure: LEFT CAROTID ENDARTERECTOMY WITH HEMASHIELD PATCH ANGIOPLASTY;  Surgeon: Sherren Kerns, MD;  Location: [facility] OR;  Service: Vascular;  Laterality: Left;   ESOPHAGOGASTRODUODENOSCOPY (EGD) WITH PROPOFOL N/A 11/27/2021   Procedure: ESOPHAGOGASTRODUODENOSCOPY (EGD) WITH PROPOFOL;  Surgeon: Benancio Deeds, MD;  Location: [facility] ENDOSCOPY;  Service: Gastroenterology;  Laterality: N/A;   NECK SURGERY  2005   ROTATOR CUFF REPAIR Right 02/2014   [name]    reports that she has quit smoking. She has never used smokeless tobacco. She reports that she does not drink alcohol and does not  use drugs. family history includes Diabetes in her father, sister, and another family member; Heart disease in her brother, brother, father, and mother; Hypertension in her brother, brother and another family member; Lupus in her mother; Raynaud syndrome in her mother; Stroke in her father. Allergies  Allergen Reactions   Macrodantin [Nitrofurantoin Macrocrystal] Shortness Of Breath, Diarrhea and Other (See Comments)    GI upset   Adipex-P [Phentermine] Other (See Comments)    "All the side effects listed on the paper"   Asa [Aspirin] Other (See Comments)    Told to avoid due to kidney function   Bactrim [Sulfamethoxazole-Trimethoprim] Nausea And Vomiting   Cipro [Ciprofloxacin Hcl] Swelling   Levaquin [Levofloxacin In D5w] Other  (See Comments)    GI upset   Lipitor [Atorvastatin] Other (See Comments)    Myalgia    Lyrica [Pregabalin] Swelling and Other (See Comments)    Headaches Confusion   Nsaids Other (See Comments)    Told to avoid due to kidney function   Vibramycin [Doxycycline] Nausea Only   Zinacef [Cefuroxime] Swelling   Neurontin [Gabapentin] Rash   Current Outpatient Medications on File Prior to Visit  Medication Sig Dispense Refill   acetaminophen (TYLENOL) 500 MG tablet Take 1,000 mg by mouth every 8 (eight) hours as needed (pain).     amLODipine (NORVASC) 10 MG tablet TAKE ONE TABLET BY MOUTH DAILY 90 tablet 3   Ascorbic Acid (VITAMIN C) 1000 MG tablet Take 1,000 mg by mouth daily.     aspirin EC 81 MG tablet Take 81 mg by mouth daily.     carvedilol (COREG) 3.125 MG tablet Take 3.125 mg by mouth 2 (two) times daily.     chlorpheniramine-HYDROcodone (TUSSIONEX) 10-8 MG/5ML Take 5 mLs by mouth every 12 (twelve) hours as needed for cough. 115 mL 0   Cholecalciferol (VITAMIN D3) 1000 units CAPS Take 2 capsules (2,000 Units total) by mouth daily. 60 capsule 0   clobetasol (TEMOVATE) 0.05 % external solution Apply 1 application topically daily as needed (scalp psoriasis).   0   Continuous Blood Gluc Receiver (DEXCOM G6 RECEIVER) DEVI Use to check blood sugars daily 1 each 0   Continuous Blood Gluc Transmit (DEXCOM G6 TRANSMITTER) MISC Use as directed to check blood sugars 1 each 3   Continuous Glucose Sensor (DEXCOM G6 SENSOR) MISC APPLY SENSOR TO SKIN FOR CONTINUOUS BLOOD SUGAR MONITORING, REPLACE EVERY 10 DAYS 9 each 5   doxycycline (VIBRA-TABS) 100 MG tablet Take 1 tablet (100 mg total) by mouth 2 (two) times daily. 20 tablet 0   estradiol (ESTRACE) 0.1 MG/GM vaginal cream Place 1 Applicatorful vaginally See admin instructions. Apply to vagina twice weekly on Monday, Thursday.     famotidine (PEPCID) 20 MG tablet Take 1 tablet (20 mg total) by mouth 2 (two) times daily. 180 tablet 3   hydrALAZINE  (APRESOLINE) 100 MG tablet TAKE ONE TABLET BY MOUTH THREE TIMES A DAY 90 tablet 11   levocetirizine (XYZAL) 5 MG tablet Take 5 mg by mouth every evening.     levothyroxine (SYNTHROID) 75 MCG tablet TAKE ONE TABLET BY MOUTH DAILY 90 tablet 3   lovastatin (MEVACOR) 40 MG tablet TAKE TWO TABLETS BY MOUTH AT BEDTIME 180 tablet 2   olmesartan (BENICAR) 5 MG tablet Take 5 mg by mouth daily.     pantoprazole (PROTONIX) 40 MG tablet Take 1 tablet (40 mg total) by mouth 2 (two) times daily before a meal. (Patient taking differently: Take 40 mg by mouth 2 (  two) times daily.) 180 tablet 3   Semaglutide,0.25 or 0.5MG /DOS, (OZEMPIC, 0.25 OR 0.5 MG/DOSE,) 2 MG/3ML SOPN Take 0.25 mg subcutaneous once weekly 3 mL 11   triamcinolone cream (KENALOG) 0.1 % Apply 1 application  topically daily as needed (psoriasis).     TURMERIC CURCUMIN PO Take 1 tablet by mouth daily.     No current facility-administered medications on file prior to visit.        ROS:  All others reviewed and negative.  Objective        PE:  BP 126/78 (BP Location: Left Arm, Patient Position: Sitting, Cuff Size: Normal)   Pulse 67   Temp 98.3 F (36.8 C) (Oral)   Ht 5\' 2"  (1.575 m)   Wt 183 lb (83 kg)   SpO2 97%   BMI 33.47 kg/m                 Constitutional: Pt appears in NAD               HENT: Head: NCAT.                Right Ear: External ear normal.                 Left Ear: External ear normal.                Eyes: . Pupils are equal, round, and reactive to light. Conjunctivae and EOM are normal               Nose: without d/c or deformity               Neck: Neck supple. Gross normal ROM               Cardiovascular: Normal rate and regular rhythm.                 Pulmonary/Chest: Effort normal and breath sounds without rales or wheezing.                Abd:  Soft, NT, ND, + BS, no organomegaly               Neurological: Pt is alert. At baseline orientation, motor grossly intact               Skin: Skin is warm. No  rashes, no other new lesions, LE edema - ***               Psychiatric: Pt behavior is normal without agitation   Micro: none  Cardiac tracings I have personally interpreted today:  none  Pertinent Radiological findings (summarize): none   Lab Results  Component Value Date   WBC 9.6 04/09/2022   HGB 13.7 04/09/2022   HCT 40.0 04/09/2022   PLT 261.0 04/09/2022   GLUCOSE 117 (H) 04/09/2022   CHOL 156 04/09/2022   TRIG 97.0 04/09/2022   HDL 47.80 04/09/2022   LDLDIRECT 107.0 10/06/2021   LDLCALC 89 04/09/2022   ALT 14 04/09/2022   AST 12 04/09/2022   NA 142 04/09/2022   K 4.1 04/09/2022   CL 107 04/09/2022   CREATININE 1.74 (H) 04/09/2022   BUN 33 (H) 04/09/2022   CO2 24 04/09/2022   TSH 6.71 (H) 04/09/2022   INR 0.99 08/17/2014   HGBA1C 6.7 (H) 04/09/2022   MICROALBUR 7.0 (H) 04/09/2022   Assessment/Plan:  Keonta Byfield Sime is a 67 y.o. White or Caucasian [1] female with  has a past medical  history of Abdominal pain, epigastric (12/20/2006), ALLERGIC RHINITIS (12/25/2006), Allergy, ANXIETY (10/16/2006), Arthritis, BACK PAIN (06/15/2008), Blood transfusion without reported diagnosis, BRADYCARDIA (05/04/2008), C. difficile diarrhea, Carpal tunnel syndrome (10/16/2006), Cellulitis and abscess of other specified site (06/15/2008), CHEST PAIN (09/08/2009), Chronic low back pain, CIRRHOSIS (10/16/2006), CKD (chronic kidney disease), CKD (chronic kidney disease) stage 3, GFR 30-59 ml/min (HCC) (10/16/2006), COMMON MIGRAINE (12/20/2006), Complication of anesthesia, CONTUSION, LOWER LEG (09/15/2008), DEPRESSION (10/16/2006), Diabetes mellitus without complication (HCC), Esophageal reflux (12/25/2006), HYPERLIPIDEMIA (12/25/2006), Hyperlipidemia, HYPERTENSION (08/15/2006), Hypertension, HYPOTHYROIDISM (08/15/2006), Kidney failure, acute (HCC) (2009), Left-sided carotid artery disease (HCC), MIGRAINE HEADACHE (10/16/2006), Obesity (10/16/2006), OVARIAN CYST (12/20/2006), Psoriasis (01/03/2011), SHINGLES, HX OF  (12/20/2006), Stroke (HCC), TRANSIENT ISCHEMIC ATTACKS, HX OF (12/20/2006), VERTIGO (09/15/2007), and VITAMIN B12 DEFICIENCY (01/09/2007).  No problem-specific Assessment & Plan notes found for this encounter.  Followup: No follow-ups on file.  Oliver Barre, MD 10/08/2022 10:12 AM [facility] Internal Medicine

## 2022-10-09 MED ORDER — ONDANSETRON 4 MG PO TBDP: 4.0000 mg | ORAL_TABLET | Freq: Three times a day (TID) | ORAL | 1 refills | Status: DC | PRN

## 2022-10-09 NOTE — Telephone Encounter (Signed)
No nausea med was sent..please advise../l,mb

## 2022-10-10 ENCOUNTER — Other Ambulatory Visit: Payer: Self-pay | Admitting: Internal Medicine

## 2022-10-10 ENCOUNTER — Encounter: Payer: Self-pay | Admitting: Internal Medicine

## 2022-10-11 ENCOUNTER — Encounter: Payer: Self-pay | Admitting: Internal Medicine

## 2022-10-11 NOTE — Assessment & Plan Note (Signed)
Lab Results  Component Value Date   HGBA1C 5.8 10/08/2022   With uncontrolled obesity, pt to increase ozempic 1 mg weekly

## 2022-10-11 NOTE — Assessment & Plan Note (Signed)
Lab Results  Component Value Date   CREATININE 1.65 (H) 10/08/2022   Stable overall, cont to avoid nephrotoxins

## 2022-10-11 NOTE — Assessment & Plan Note (Signed)
Lab Results  Component Value Date   TSH 2.72 10/08/2022   Stable, pt to continue levothyroxine 75 mcg every day,

## 2022-10-11 NOTE — Assessment & Plan Note (Signed)
Lab Results  Component Value Date   LDLCALC 74 10/08/2022   Uncontrolled goal ldl < 70,, pt to continue current statin lovastatin 80 every day, for lower chol diet, declines other change for now

## 2022-10-11 NOTE — Assessment & Plan Note (Signed)
BP Readings from Last 3 Encounters:  10/08/22 126/78  05/04/22 134/82  04/09/22 130/78   Stable, pt to continue medical treatment norvasc 10 every day, coreg 3.125 bid, hydralazine 100 tid, benicar 5 qd

## 2022-10-11 NOTE — Assessment & Plan Note (Signed)
Lab Results  Component Value Date   VITAMINB12 138 (L) 10/08/2022   Low, to start oral replacement - b12 1000 mcg qd

## 2022-10-19 DIAGNOSIS — E782 Mixed hyperlipidemia: Secondary | ICD-10-CM | POA: Diagnosis not present

## 2022-10-20 LAB — LIPID PANEL
Chol/HDL Ratio: 4 ratio (ref 0.0–4.4)
Cholesterol, Total: 151 mg/dL (ref 100–199)
HDL: 38 mg/dL — ABNORMAL LOW (ref >39)
LDL Chol Calc (NIH): 84 mg/dL (ref 0–99)
Triglycerides: 168 mg/dL — ABNORMAL HIGH (ref 0–149)
VLDL Cholesterol Cal: 29 mg/dL (ref 5–40)

## 2022-10-26 DIAGNOSIS — M5416 Radiculopathy, lumbar region: Secondary | ICD-10-CM | POA: Diagnosis not present

## 2022-11-09 DIAGNOSIS — K080 Exfoliation of teeth due to systemic causes: Secondary | ICD-10-CM | POA: Diagnosis not present

## 2022-11-15 ENCOUNTER — Telehealth (INDEPENDENT_AMBULATORY_CARE_PROVIDER_SITE_OTHER): Payer: Medicare Other | Admitting: Internal Medicine

## 2022-11-15 DIAGNOSIS — K219 Gastro-esophageal reflux disease without esophagitis: Secondary | ICD-10-CM | POA: Diagnosis not present

## 2022-11-15 DIAGNOSIS — K5900 Constipation, unspecified: Secondary | ICD-10-CM

## 2022-11-15 DIAGNOSIS — I10 Essential (primary) hypertension: Secondary | ICD-10-CM

## 2022-11-15 DIAGNOSIS — E1169 Type 2 diabetes mellitus with other specified complication: Secondary | ICD-10-CM

## 2022-11-15 DIAGNOSIS — Z7985 Long-term (current) use of injectable non-insulin antidiabetic drugs: Secondary | ICD-10-CM | POA: Diagnosis not present

## 2022-11-15 DIAGNOSIS — E669 Obesity, unspecified: Secondary | ICD-10-CM

## 2022-11-15 MED ORDER — LINACLOTIDE 145 MCG PO CAPS: 145.0000 ug | ORAL_CAPSULE | Freq: Every day | ORAL | 2 refills | Status: DC | PRN

## 2022-11-15 NOTE — Patient Instructions (Signed)
Please take all new medication as prescribed  - the linzess as needed, if the miralax up to twice per day is not helpful  Please continue all other medications as before, including the ozempic as is the current dose  Please have the pharmacy call with any other refills you may need.  Please continue your efforts at being more active, low cholesterol diet, and weight control..  Please keep your appointments with your specialists as you may have planned

## 2022-11-15 NOTE — Progress Notes (Signed)
Patient ID: Heather Dawson, female   DOB: 05/17/1955, 67 y.o.   MRN: [id number]  Virtual Visit via Video Note  I connected with Heather Dawson on sept 26 2024 at  9:40 AM EDT by a video enabled telemedicine application and verified that I am speaking with the correct person using two identifiers.  Location of all participants today Patient: at home Provider: at office   I discussed the limitations of evaluation and management by telemedicine and the availability of in person appointments. The patient expressed understanding and agreed to proceed.      Chief Complaint: follow up obesity with wt loss with ozempic, constipation, htn, dm       HPI:  Heather Dawson is a 67 y.o. female here with c/o worsening consipation for 5 days while taking ozemipic for sugar, also successful in losing about 18 lbs in 14 wks per pt;  Pt denies chest pain, increased sob or doe, wheezing, orthopnea, PND, increased LE swelling, palpitations, dizziness or syncope.   Pt denies polydipsia, polyuria, or new focal neuro s/s.    Pt denies fever, night sweats, loss of appetite, or other constitutional symptoms  Denies worsening reflux, dysphagia, n/v, diarrhea or blood.   Past Medical History:  Diagnosis Date   Abdominal pain, epigastric 12/20/2006   ALLERGIC RHINITIS 12/25/2006   Allergy    ANXIETY 10/16/2006   no per pt   Arthritis    right shoulder   BACK PAIN 06/15/2008   Blood transfusion without reported diagnosis    BRADYCARDIA 05/04/2008   C. difficile diarrhea    Carpal tunnel syndrome 10/16/2006   Cellulitis and abscess of other specified site 06/15/2008   CHEST PAIN 09/08/2009   Chronic low back pain    CIRRHOSIS 10/16/2006   CKD (chronic kidney disease)    2008- had Ecoli and caused renal failure, no problems since then   CKD (chronic kidney disease) stage 3, GFR 30-59 ml/min (HCC) 10/16/2006   Qualifier: Diagnosis of  By: Jonny Ruiz MD, Len Blalock    COMMON MIGRAINE 12/20/2006   Complication of anesthesia    awake  during 2 surgeries   CONTUSION, LOWER LEG 09/15/2008   DEPRESSION 10/16/2006   no per pt   Diabetes mellitus without complication (HCC)    Esophageal reflux 12/25/2006   HYPERLIPIDEMIA 12/25/2006   Hyperlipidemia    HYPERTENSION 08/15/2006   Hypertension    HYPOTHYROIDISM 08/15/2006   Kidney failure, acute (HCC) 2009   as a result of severe E Coli infection   Left-sided carotid artery disease (HCC)    MIGRAINE HEADACHE 10/16/2006   Obesity 10/16/2006   OVARIAN CYST 12/20/2006   Psoriasis 01/03/2011   SHINGLES, HX OF 12/20/2006   Stroke (HCC)    3 strokes 2008-no deficits, only on ASA   TRANSIENT ISCHEMIC ATTACKS, HX OF 12/20/2006   VERTIGO 09/15/2007   VITAMIN B12 DEFICIENCY 01/09/2007   Past Surgical History:  Procedure Laterality Date   ABDOMINAL HYSTERECTOMY  02/2006   ANTERIOR CERVICAL DECOMP/DISCECTOMY FUSION  2005   BIOPSY  11/27/2021   Procedure: BIOPSY;  Surgeon: Benancio Deeds, MD;  Location: [facility] ENDOSCOPY;  Service: Gastroenterology;;   CESAREAN SECTION     CHOLECYSTECTOMY     COLONOSCOPY     COLONOSCOPY WITH PROPOFOL N/A 11/27/2021   Procedure: COLONOSCOPY WITH PROPOFOL;  Surgeon: Benancio Deeds, MD;  Location: [facility] ENDOSCOPY;  Service: Gastroenterology;  Laterality: N/A;   ENDARTERECTOMY Left 08/25/2014   Procedure: LEFT CAROTID ENDARTERECTOMY WITH HEMASHIELD PATCH  ANGIOPLASTY;  Surgeon: Sherren Kerns, MD;  Location: [facility] OR;  Service: Vascular;  Laterality: Left;   ESOPHAGOGASTRODUODENOSCOPY (EGD) WITH PROPOFOL N/A 11/27/2021   Procedure: ESOPHAGOGASTRODUODENOSCOPY (EGD) WITH PROPOFOL;  Surgeon: Benancio Deeds, MD;  Location: [facility] ENDOSCOPY;  Service: Gastroenterology;  Laterality: N/A;   NECK SURGERY  2005   ROTATOR CUFF REPAIR Right 02/2014   [name]    reports that she has quit smoking. She has never used smokeless tobacco. She reports that she does not drink alcohol and does not use drugs. family history includes Diabetes in her father, sister, and  another family member; Heart disease in her brother, brother, father, and mother; Hypertension in her brother, brother and another family member; Lupus in her mother; Raynaud syndrome in her mother; Stroke in her father. Allergies  Allergen Reactions   Macrodantin [Nitrofurantoin Macrocrystal] Shortness Of Breath, Diarrhea and Other (See Comments)    GI upset   Adipex-P [Phentermine] Other (See Comments)    "All the side effects listed on the paper"   Asa [Aspirin] Other (See Comments)    Told to avoid due to kidney function   Bactrim [Sulfamethoxazole-Trimethoprim] Nausea And Vomiting   Cipro [Ciprofloxacin Hcl] Swelling   Levaquin [Levofloxacin In D5w] Other (See Comments)    GI upset   Lipitor [Atorvastatin] Other (See Comments)    Myalgia    Lyrica [Pregabalin] Swelling and Other (See Comments)    Headaches Confusion   Nsaids Other (See Comments)    Told to avoid due to kidney function   Vibramycin [Doxycycline] Nausea Only   Zinacef [Cefuroxime] Swelling   Neurontin [Gabapentin] Rash   Current Outpatient Medications on File Prior to Visit  Medication Sig Dispense Refill   acetaminophen (TYLENOL) 500 MG tablet Take 1,000 mg by mouth every 8 (eight) hours as needed (pain).     amLODipine (NORVASC) 10 MG tablet TAKE ONE TABLET BY MOUTH DAILY 90 tablet 3   Ascorbic Acid (VITAMIN C) 1000 MG tablet Take 1,000 mg by mouth daily.     aspirin EC 81 MG tablet Take 81 mg by mouth daily.     carvedilol (COREG) 3.125 MG tablet Take 3.125 mg by mouth 2 (two) times daily.     chlorpheniramine-HYDROcodone (TUSSIONEX) 10-8 MG/5ML Take 5 mLs by mouth every 12 (twelve) hours as needed for cough. 115 mL 0   Cholecalciferol (VITAMIN D3) 1000 units CAPS Take 2 capsules (2,000 Units total) by mouth daily. 60 capsule 0   clobetasol (TEMOVATE) 0.05 % external solution Apply 1 application topically daily as needed (scalp psoriasis).   0   estradiol (ESTRACE) 0.1 MG/GM vaginal cream Place 1  Applicatorful vaginally See admin instructions. Apply to vagina twice weekly on Monday, Thursday.     famotidine (PEPCID) 20 MG tablet Take 1 tablet (20 mg total) by mouth 2 (two) times daily. 180 tablet 3   hydrALAZINE (APRESOLINE) 100 MG tablet TAKE ONE TABLET BY MOUTH THREE TIMES A DAY 90 tablet 11   levocetirizine (XYZAL) 5 MG tablet Take 5 mg by mouth every evening.     levothyroxine (SYNTHROID) 75 MCG tablet TAKE ONE TABLET BY MOUTH DAILY 90 tablet 3   lovastatin (MEVACOR) 40 MG tablet TAKE TWO TABLETS BY MOUTH AT BEDTIME 180 tablet 2   olmesartan (BENICAR) 5 MG tablet Take 5 mg by mouth daily.     ondansetron (ZOFRAN-ODT) 4 MG disintegrating tablet Take 1 tablet (4 mg total) by mouth every 8 (eight) hours as needed for nausea or vomiting.  30 tablet 1   pantoprazole (PROTONIX) 40 MG tablet Take 1 tablet (40 mg total) by mouth 2 (two) times daily before a meal. (Patient taking differently: Take 40 mg by mouth 2 (two) times daily.) 180 tablet 3   Semaglutide, 1 MG/DOSE, 4 MG/3ML SOPN Inject 1 mg as directed once a week. 3 mL 11   triamcinolone cream (KENALOG) 0.1 % Apply 1 application  topically daily as needed (psoriasis).     TURMERIC CURCUMIN PO Take 1 tablet by mouth daily.     No current facility-administered medications on file prior to visit.    Observations/Objective: Alert, NAD, appropriate mood and affect, resps normal, cn 2-12 intact, moves all 4s, no visible rash or swelling Lab Results  Component Value Date   WBC 9.6 04/09/2022   HGB 13.7 04/09/2022   HCT 40.0 04/09/2022   PLT 261.0 04/09/2022   GLUCOSE 105 (H) 10/08/2022   CHOL 151 10/19/2022   TRIG 168 (H) 10/19/2022   HDL 38 (L) 10/19/2022   LDLDIRECT 107.0 10/06/2021   LDLCALC 84 10/19/2022   ALT 13 10/08/2022   AST 15 10/08/2022   NA 142 10/08/2022   K 4.0 10/08/2022   CL 107 10/08/2022   CREATININE 1.65 (H) 10/08/2022   BUN 25 (H) 10/08/2022   CO2 24 10/08/2022   TSH 2.72 10/08/2022   INR 0.99 08/17/2014    HGBA1C 5.8 10/08/2022   MICROALBUR 7.0 (H) 04/09/2022   Assessment and Plan: See notes  Follow Up Instructions: See notes   I discussed the assessment and treatment plan with the patient. The patient was provided an opportunity to ask questions and all were answered. The patient agreed with the plan and demonstrated an understanding of the instructions.   The patient was advised to call back or seek an in-person evaluation if the symptoms worsen or if the condition fails to improve as anticipated.  Oliver Barre, MD

## 2022-11-18 ENCOUNTER — Encounter: Payer: Self-pay | Admitting: Internal Medicine

## 2022-11-18 DIAGNOSIS — K5900 Constipation, unspecified: Secondary | ICD-10-CM | POA: Insufficient documentation

## 2022-11-18 NOTE — Assessment & Plan Note (Signed)
Stable overall, cont current med tx protonix 40 bid

## 2022-11-18 NOTE — Assessment & Plan Note (Signed)
Likeley related to ozempic use and wt loss - for miralax trial, but also linzess prn backup if not helpful.

## 2022-11-18 NOTE — Assessment & Plan Note (Signed)
Lab Results  Component Value Date   HGBA1C 5.8 10/08/2022   Stable, pt to continue current medical treatment ozempic 1 mg weekly

## 2022-11-19 DIAGNOSIS — N183 Chronic kidney disease, stage 3 unspecified: Secondary | ICD-10-CM | POA: Diagnosis not present

## 2022-11-19 DIAGNOSIS — I129 Hypertensive chronic kidney disease with stage 1 through stage 4 chronic kidney disease, or unspecified chronic kidney disease: Secondary | ICD-10-CM | POA: Diagnosis not present

## 2022-11-19 DIAGNOSIS — E039 Hypothyroidism, unspecified: Secondary | ICD-10-CM | POA: Diagnosis not present

## 2022-11-19 DIAGNOSIS — E1122 Type 2 diabetes mellitus with diabetic chronic kidney disease: Secondary | ICD-10-CM | POA: Diagnosis not present

## 2022-11-20 LAB — LAB REPORT - SCANNED
Albumin, Urine POC: 109.4
Albumin/Creatinine Ratio, Urine, POC: 38
Creatinine, POC: 285.9 mg/dL
EGFR: 25

## 2022-11-20 NOTE — Progress Notes (Signed)
Cardiology Clinic Note   Patient Name: Heather Dawson Date of Encounter: 11/23/2022  Primary Care Provider:  Corwin Levins, MD Primary Cardiologist:  Nanetta Batty, MD  Patient Profile    Heather Dawson 67 year old female presents the clinic today for follow-up evaluation of her essential hypertension and aortic atherosclerosis.  Past Medical History    Past Medical History:  Diagnosis Date   Abdominal pain, epigastric 12/20/2006   ALLERGIC RHINITIS 12/25/2006   Allergy    ANXIETY 10/16/2006   no per pt   Arthritis    right shoulder   BACK PAIN 06/15/2008   Blood transfusion without reported diagnosis    BRADYCARDIA 05/04/2008   C. difficile diarrhea    Carpal tunnel syndrome 10/16/2006   Cellulitis and abscess of other specified site 06/15/2008   CHEST PAIN 09/08/2009   Chronic low back pain    CIRRHOSIS 10/16/2006   CKD (chronic kidney disease)    2008- had Ecoli and caused renal failure, no problems since then   CKD (chronic kidney disease) stage 3, GFR 30-59 ml/min (HCC) 10/16/2006   Qualifier: Diagnosis of  By: Jonny Ruiz MD, Len Blalock    COMMON MIGRAINE 12/20/2006   Complication of anesthesia    awake during 2 surgeries   CONTUSION, LOWER LEG 09/15/2008   DEPRESSION 10/16/2006   no per pt   Diabetes mellitus without complication (HCC)    Esophageal reflux 12/25/2006   HYPERLIPIDEMIA 12/25/2006   Hyperlipidemia    HYPERTENSION 08/15/2006   Hypertension    HYPOTHYROIDISM 08/15/2006   Kidney failure, acute (HCC) 2009   as a result of severe E Coli infection   Left-sided carotid artery disease (HCC)    MIGRAINE HEADACHE 10/16/2006   Obesity 10/16/2006   OVARIAN CYST 12/20/2006   Psoriasis 01/03/2011   SHINGLES, HX OF 12/20/2006   Stroke (HCC)    3 strokes 2008-no deficits, only on ASA   TRANSIENT ISCHEMIC ATTACKS, HX OF 12/20/2006   VERTIGO 09/15/2007   VITAMIN B12 DEFICIENCY 01/09/2007   Past Surgical History:  Procedure Laterality Date   ABDOMINAL HYSTERECTOMY  02/2006    ANTERIOR CERVICAL DECOMP/DISCECTOMY FUSION  2005   BIOPSY  11/27/2021   Procedure: BIOPSY;  Surgeon: Benancio Deeds, MD;  Location: [facility] ENDOSCOPY;  Service: Gastroenterology;;   CESAREAN SECTION     CHOLECYSTECTOMY     COLONOSCOPY     COLONOSCOPY WITH PROPOFOL N/A 11/27/2021   Procedure: COLONOSCOPY WITH PROPOFOL;  Surgeon: Benancio Deeds, MD;  Location: [facility] ENDOSCOPY;  Service: Gastroenterology;  Laterality: N/A;   ENDARTERECTOMY Left 08/25/2014   Procedure: LEFT CAROTID ENDARTERECTOMY WITH HEMASHIELD PATCH ANGIOPLASTY;  Surgeon: Sherren Kerns, MD;  Location: [facility] OR;  Service: Vascular;  Laterality: Left;   ESOPHAGOGASTRODUODENOSCOPY (EGD) WITH PROPOFOL N/A 11/27/2021   Procedure: ESOPHAGOGASTRODUODENOSCOPY (EGD) WITH PROPOFOL;  Surgeon: Benancio Deeds, MD;  Location: [facility] ENDOSCOPY;  Service: Gastroenterology;  Laterality: N/A;   NECK SURGERY  2005   ROTATOR CUFF REPAIR Right 02/2014   [name]    Allergies  Allergies  Allergen Reactions   Macrodantin [Nitrofurantoin Macrocrystal] Shortness Of Breath, Diarrhea and Other (See Comments)    GI upset   Adipex-P [Phentermine] Other (See Comments)    "All the side effects listed on the paper"   Asa [Aspirin] Other (See Comments)    Told to avoid due to kidney function   Bactrim [Sulfamethoxazole-Trimethoprim] Nausea And Vomiting   Cipro [Ciprofloxacin Hcl] Swelling   Levaquin [Levofloxacin In D5w] Other (See  Comments)    GI upset   Lipitor [Atorvastatin] Other (See Comments)    Myalgia    Lyrica [Pregabalin] Swelling and Other (See Comments)    Headaches Confusion   Nsaids Other (See Comments)    Told to avoid due to kidney function   Vibramycin [Doxycycline] Nausea Only   Zinacef [Cefuroxime] Swelling   Neurontin [Gabapentin] Rash    History of Present Illness    Heather Dawson has a PMH of hypertension, left-sided carotid artery disease, mild aortic stenosis, transient hypotension, allergic rhinitis, GERD,  colitis, hypothyroidism, type 2 diabetes, psoriasis, CKD stage IV, hyperlipidemia, anxiety, mild AS, and G1 DD.  She was initially seen and evaluated by cardiology for left carotid endarterectomy.  During that time she reported that both of her parents had MIs.  Both parents were deceased.  She had a brother who had MI as well.  She reported that she had several strokes in 2009 with left-sided hemiplegia which had resolved.  She subsequently had carotid Doppler studies which showed high-grade asymptomatic left intercrural carotid artery stenosis.  She had been referred to Dr. Darrick Penna.  She underwent stress testing 6/16 which was normal and low risk.  She underwent uncomplicated left carotid endarterectomy.  She was seen by Dr. Allyson Sabal on 11/21/2021 for follow-up evaluation.  She continued to do well at that time.  She reported that she had been on some antibiotics for UTI.  This resulted in GI complications.  She did note some lower extremity swelling that had improved with the addition of chlorthalidone.  She denied chest pain shortness of breath.  She presents to the clinic today for follow-up evaluation and states she feels well.  She continues to follow with her PCP and nephrology.  She recently had her on losartan discontinued by nephrology.  She reports a 20 pound weight loss.  This has been planned and was through dietary changes and the use of Ozempic.  She did report constipation with up titration of Ozempic.  This is since resolved.  She notes that she has some limits in her physical activity due to chronic hip and low back pain.  She has seen orthopedics for this.  I have asked her to continue her current weight loss, we will continue her current medication regimen and plan follow-up in 12 months.  Today she denies chest pain, shortness of breath, lower extremity edema, fatigue, palpitations, melena, hematuria, hemoptysis, diaphoresis, weakness, presyncope, syncope, orthopnea, and PND.     Home  Medications    Prior to Admission medications   Medication Sig Start Date End Date Taking? Authorizing Provider  acetaminophen (TYLENOL) 500 MG tablet Take 1,000 mg by mouth every 8 (eight) hours as needed (pain).    [provider]  amLODipine (NORVASC) 10 MG tablet TAKE ONE TABLET BY MOUTH DAILY 06/18/22   Corwin Levins, MD  Ascorbic Acid (VITAMIN C) 1000 MG tablet Take 1,000 mg by mouth daily.    [provider]  aspirin EC 81 MG tablet Take 81 mg by mouth daily.    [provider]  carvedilol (COREG) 3.125 MG tablet Take 3.125 mg by mouth 2 (two) times daily. 03/12/22   [provider]  chlorpheniramine-HYDROcodone (TUSSIONEX) 10-8 MG/5ML Take 5 mLs by mouth every 12 (twelve) hours as needed for cough. 05/03/22   Corwin Levins, MD  Cholecalciferol (VITAMIN D3) 1000 units CAPS Take 2 capsules (2,000 Units total) by mouth daily. 04/27/15   Corwin Levins, MD  clobetasol (TEMOVATE)  0.05 % external solution Apply 1 application topically daily as needed (scalp psoriasis).  12/07/15   [provider]  estradiol (ESTRACE) 0.1 MG/GM vaginal cream Place 1 Applicatorful vaginally See admin instructions. Apply to vagina twice weekly on Monday, Thursday. 03/15/21   [provider]  famotidine (PEPCID) 20 MG tablet Take 1 tablet (20 mg total) by mouth 2 (two) times daily. 12/05/21   Corwin Levins, MD  hydrALAZINE (APRESOLINE) 100 MG tablet TAKE ONE TABLET BY MOUTH THREE TIMES A DAY 06/29/22   Corwin Levins, MD  levocetirizine (XYZAL) 5 MG tablet Take 5 mg by mouth every evening. 09/12/17   [provider]  levothyroxine (SYNTHROID) 75 MCG tablet TAKE ONE TABLET BY MOUTH DAILY 07/27/22   Corwin Levins, MD  linaclotide [facility]) 145 MCG CAPS capsule Take 1 capsule (145 mcg total) by mouth daily as needed. 11/15/22   Corwin Levins, MD  lovastatin (MEVACOR) 40 MG tablet TAKE TWO TABLETS BY MOUTH AT BEDTIME 07/31/22   Corwin Levins, MD  olmesartan (BENICAR) 5 MG  tablet Take 5 mg by mouth daily. 02/20/22   [provider]  ondansetron (ZOFRAN-ODT) 4 MG disintegrating tablet Take 1 tablet (4 mg total) by mouth every 8 (eight) hours as needed for nausea or vomiting. 10/09/22   Corwin Levins, MD  pantoprazole (PROTONIX) 40 MG tablet Take 1 tablet (40 mg total) by mouth 2 (two) times daily before a meal. Patient taking differently: Take 40 mg by mouth 2 (two) times daily. 10/12/21   Corwin Levins, MD  Semaglutide, 1 MG/DOSE, 4 MG/3ML SOPN Inject 1 mg as directed once a week. 10/08/22   Corwin Levins, MD  triamcinolone cream (KENALOG) 0.1 % Apply 1 application  topically daily as needed (psoriasis). 06/03/19   [provider]  TURMERIC CURCUMIN PO Take 1 tablet by mouth daily.    [provider]    Family History    Family History  Problem Relation Age of Onset   Stroke Father    Heart disease Father    Diabetes Father    Lupus Mother    Raynaud syndrome Mother    Heart disease Mother    Diabetes Sister    Heart disease Brother    Hypertension Brother    Heart disease Brother    Hypertension Brother    Diabetes Other    Hypertension Other    Colon cancer Neg Hx    Esophageal cancer Neg Hx    Rectal cancer Neg Hx    Stomach cancer Neg Hx    She indicated that her mother is deceased. She indicated that her father is deceased. She indicated that her sister is alive. She indicated that both of her brothers are alive. She indicated that the status of her neg hx is unknown. She indicated that the status of her other is unknown.  Social History    Social History   Socioeconomic History   Marital status: Married    Spouse name: Jake Shark   Number of children: 2   Years of education: Not on file   Highest education level: Not on file  Occupational History   Occupation: retired  Tobacco Use   Smoking status: Former   Smokeless tobacco: Never   Tobacco comments:    quit smoking over 30 years ago  Vaping Use   Vaping  status: Never Used  Substance and Sexual Activity   Alcohol use: No   Drug use: No  Sexual activity: Not on file  Other Topics Concern   Not on file  Social History Narrative   Lives with husband   Right Handed   Drinks 2-4 cups coffee   Social Determinants of Health   Financial Resource Strain: Low Risk  (03/19/2022)   Overall Financial Resource Strain (CARDIA)    Difficulty of Paying Living Expenses: Not hard at all  Food Insecurity: No Food Insecurity (03/19/2022)   Hunger Vital Sign    Worried About Running Out of Food in the Last Year: Never true    Ran Out of Food in the Last Year: Never true  Transportation Needs: No Transportation Needs (03/19/2022)   PRAPARE - Administrator, Civil Service (Medical): No    Lack of Transportation (Non-Medical): No  Physical Activity: Inactive (03/19/2022)   Exercise Vital Sign    Days of Exercise per Week: 0 days    Minutes of Exercise per Session: 0 min  Stress: No Stress Concern Present (03/19/2022)   [employer] of Occupational Health - Occupational Stress Questionnaire    Feeling of Stress : Not at all  Social Connections: Socially Integrated (03/19/2022)   Social Connection and Isolation Panel [NHANES]    Frequency of Communication with Friends and Family: More than three times a week    Frequency of Social Gatherings with Friends and Family: More than three times a week    Attends Religious Services: More than 4 times per year    Active Member of [employer] or Organizations: Yes    Attends Engineer, structural: More than 4 times per year    Marital Status: Married  Catering manager Violence: Not At Risk (03/19/2022)   Humiliation, Afraid, Rape, and Kick questionnaire    Fear of Current or Ex-Partner: No    Emotionally Abused: No    Physically Abused: No    Sexually Abused: No     Review of Systems    General:  No chills, fever, night sweats or weight changes.  Cardiovascular:  No chest pain, dyspnea on  exertion, edema, orthopnea, palpitations, paroxysmal nocturnal dyspnea. Dermatological: No rash, lesions/masses Respiratory: No cough, dyspnea Urologic: No hematuria, dysuria Abdominal:   No nausea, vomiting, diarrhea, bright red blood per rectum, melena, or hematemesis Neurologic:  No visual changes, wkns, changes in mental status. All other systems reviewed and are otherwise negative except as noted above.  Physical Exam    VS:  BP 120/80 (BP Location: Right Arm, Patient Position: Sitting, Cuff Size: Normal)   Pulse 62   Ht 5\' 2"  (1.575 m)   Wt 174 lb (78.9 kg)   SpO2 98%   BMI 31.83 kg/m  , BMI Body mass index is 31.83 kg/m. GEN: Well nourished, well developed, in no acute distress. HEENT: normal. Neck: Supple, no JVD, carotid bruits, or masses. Cardiac: RRR, no murmurs, rubs, or gallops. No clubbing, cyanosis, edema.  Radials/DP/PT 2+ and equal bilaterally.  Respiratory:  Respirations regular and unlabored, clear to auscultation bilaterally. GI: Soft, nontender, nondistended, BS + x 4. MS: no deformity or atrophy. Skin: warm and dry, no rash. Neuro:  Strength and sensation are intact. Psych: Normal affect.  Accessory Clinical Findings    Recent Labs: 04/09/2022: Hemoglobin 13.7; Platelets 261.0 10/08/2022: ALT 13; BUN 25; Creatinine, Ser 1.65; Potassium 4.0; Sodium 142; TSH 2.72   Recent Lipid Panel    Component Value Date/Time   CHOL 151 10/19/2022 1113   TRIG 168 (H) 10/19/2022 1113   HDL 38 (L) 10/19/2022  1113   CHOLHDL 4.0 10/19/2022 1113   CHOLHDL 4 10/08/2022 1048   VLDL 39.4 10/08/2022 1048   LDLCALC 84 10/19/2022 1113   LDLCALC 83 09/29/2019 0905   LDLDIRECT 107.0 10/06/2021 1009         ECG personally reviewed by me today- EKG Interpretation Date/Time:  Friday November 23 2022 08:05:15 EDT Ventricular Rate:  62 PR Interval:  148 QRS Duration:  88 QT Interval:  422 QTC Calculation: 428 R Axis:   3  Text Interpretation: Normal sinus rhythm Low  voltage QRS Nonspecific ST abnormality When compared with ECG of 12-Oct-2020 13:03, PREVIOUS ECG IS PRESENT Confirmed by Edd Fabian [phone]) on 11/23/2022 8:06:21 AM   Echocardiogram 11/27/2021  IMPRESSIONS     1. Left ventricular ejection fraction by 3D volume is 61 %. The left  ventricle has normal function. The left ventricle has no regional wall  motion abnormalities. Left ventricular diastolic parameters are consistent  with Grade I diastolic dysfunction  (impaired relaxation).   2. Right ventricular systolic function is normal. The right ventricular  size is normal. There is mildly elevated pulmonary artery systolic  pressure.   3. Left atrial size was mildly dilated.   4. The mitral valve is normal in structure. Trivial mitral valve  regurgitation. No evidence of mitral stenosis.   5. Tricuspid valve regurgitation is mild to moderate.   6. The aortic valve is normal in structure. Aortic valve regurgitation is  trivial. Aortic valve sclerosis/calcification is present, without any  evidence of aortic stenosis.   7. The inferior vena cava is dilated in size with >50% respiratory  variability, suggesting right atrial pressure of 8 mmHg.   Comparison(s): 07/25/20 EF 60-65%. PA pressure . Mild AS mean  PG, peak PG.   FINDINGS   Left Ventricle: Left ventricular ejection fraction by 3D volume is 61 %.  The left ventricle has normal function. The left ventricle has no regional  wall motion abnormalities. The left ventricular internal cavity size was  normal in size. There is no left   ventricular hypertrophy. Left ventricular diastolic parameters are  consistent with Grade I diastolic dysfunction (impaired relaxation).   Right Ventricle: The right ventricular size is normal. No increase in  right ventricular wall thickness. Right ventricular systolic function is  normal. There is mildly elevated pulmonary artery systolic pressure. The  tricuspid regurgitant  velocity is 2.68   m/s, and with an assumed right atrial pressure of 8 mmHg, the estimated  right ventricular systolic pressure is 36.7 mmHg.   Left Atrium: Left atrial size was mildly dilated.   Right Atrium: Right atrial size was normal in size.   Pericardium: There is no evidence of pericardial effusion.   Mitral Valve: The mitral valve is normal in structure. Trivial mitral  valve regurgitation. No evidence of mitral valve stenosis.   Tricuspid Valve: The tricuspid valve is normal in structure. Tricuspid  valve regurgitation is mild to moderate. No evidence of tricuspid  stenosis.   Aortic Valve: The aortic valve is normal in structure. Aortic valve  regurgitation is trivial. Aortic valve sclerosis/calcification is present,  without any evidence of aortic stenosis.   Pulmonic Valve: The pulmonic valve was not well visualized. Pulmonic valve  regurgitation is trivial. No evidence of pulmonic stenosis.   Aorta: The aortic root is normal in size and structure.   Venous: The inferior vena cava is dilated in size with greater than 50%  respiratory variability, suggesting right atrial pressure of 8  mmHg.   IAS/Shunts: No atrial level shunt detected by color flow Doppler.        Assessment & Plan   1.  Atypical chest pain-denies recent episodes of arm neck back or chest discomfort.  Reassuring stress testing in 2016 which showed low risk and no ischemia. No plans for ischemic evaluation at this time.  Essential hypertension-BP today 120/80. Maintain blood pressure log Continue carvedilol, olmesartan Heart healthy low-sodium diet  G1 DD-breathing at baseline.  Able to do all of her normal daily activities.  Echocardiogram 11/27/2021 showed an LVEF of 61%, G1 DD, trivial mitral valve regurgitation, no evidence of aortic stenosis. Heart healthy low-sodium diet Continue current medical therapy Increase physical activity as tolerated Elevate lower extremities when not  active  Hyperlipidemia-LDL 84 on 10/19/22. High-fiber diet Continue lovastatin  CKD stage IV-creatinine 1.65 on 10/08/2022 Follows with PCP  Disposition: Follow-up with Dr.Berry or me in 1 year.   Thomasene Ripple. Anderia Lorenzo NP-C     11/23/2022, 8:21 AM [facility] [address] Office [phone] Fax [phone]    I spent 14 minutes examining this patient, reviewing medications, and using patient centered shared decision making involving her cardiac care.  Prior to her visit I spent greater than 20 minutes reviewing her past medical history,  medications, and prior cardiac tests.

## 2022-11-23 ENCOUNTER — Ambulatory Visit: Payer: Medicare Other | Attending: General Practice | Admitting: General Practice

## 2022-11-23 ENCOUNTER — Encounter: Payer: Self-pay | Admitting: General Practice

## 2022-11-23 VITALS — BP 120/80 | HR 62 | Ht 62.0 in | Wt 174.0 lb

## 2022-11-23 DIAGNOSIS — E782 Mixed hyperlipidemia: Secondary | ICD-10-CM | POA: Diagnosis not present

## 2022-11-23 DIAGNOSIS — I5189 Other ill-defined heart diseases: Secondary | ICD-10-CM | POA: Diagnosis not present

## 2022-11-23 DIAGNOSIS — R0789 Other chest pain: Secondary | ICD-10-CM

## 2022-11-23 DIAGNOSIS — I10 Essential (primary) hypertension: Secondary | ICD-10-CM

## 2022-11-23 DIAGNOSIS — N184 Chronic kidney disease, stage 4 (severe): Secondary | ICD-10-CM

## 2022-11-23 NOTE — Patient Instructions (Signed)
Medication Instructions:  Your physician recommends that you continue on your current medications as directed. Please refer to the Current Medication list given to you today.  *If you need a refill on your cardiac medications before your next appointment, please call your pharmacy*   Lab Work: NONE ordered at this time of appointment   Testing/Procedures: NONE ordered at this time of appointment   Follow-Up: At [facility], you and your health needs are our priority.  As part of our continuing mission to provide you with exceptional heart care, we have created designated Provider Care Teams.  These Care Teams include your primary Cardiologist (physician) and Advanced Practice Providers (APPs -  Physician Assistants and Nurse Practitioners) who all work together to provide you with the care you need, when you need it.  We recommend signing up for the patient portal called "MyChart".  Sign up information is provided on this After Visit Summary.  MyChart is used to connect with patients for Virtual Visits (Telemedicine).  Patients are able to view lab/test results, encounter notes, upcoming appointments, etc.  Non-urgent messages can be sent to your provider as well.   To learn more about what you can do with MyChart, go to [email or web address].    Your next appointment:   1 year(s)  Provider:   Nanetta Batty, MD  or Edd Fabian, FNP

## 2022-11-24 ENCOUNTER — Other Ambulatory Visit: Payer: Self-pay | Admitting: Internal Medicine

## 2022-11-26 ENCOUNTER — Other Ambulatory Visit: Payer: Self-pay

## 2022-12-05 DIAGNOSIS — N183 Chronic kidney disease, stage 3 unspecified: Secondary | ICD-10-CM | POA: Diagnosis not present

## 2022-12-05 DIAGNOSIS — I10 Essential (primary) hypertension: Secondary | ICD-10-CM | POA: Diagnosis not present

## 2022-12-10 ENCOUNTER — Encounter: Payer: Self-pay | Admitting: Internal Medicine

## 2022-12-11 ENCOUNTER — Other Ambulatory Visit: Payer: Self-pay | Admitting: Internal Medicine

## 2022-12-12 ENCOUNTER — Other Ambulatory Visit: Payer: Self-pay

## 2022-12-24 ENCOUNTER — Encounter: Payer: Self-pay | Admitting: Internal Medicine

## 2022-12-24 NOTE — Progress Notes (Unsigned)
Virtual Visit via Video Note  I connected with Heather Dawson on 12/24/22 at 10:20 AM EST by a video enabled telemedicine application and verified that I am speaking with the correct person using two identifiers.   I discussed the limitations of evaluation and management by telemedicine and the availability of in person appointments. The patient expressed understanding and agreed to proceed.  Present for the visit:  Myself, Dr Cheryll Cockayne, Darvin Neighbours.  The patient is currently at home and I am in the office.    No referring provider.    History of Present Illness: This is an acute visit for respiratory issues.   ROS    Social History   Socioeconomic History   Marital status: Married    Spouse name: Jake Shark   Number of children: 2   Years of education: Not on file   Highest education level: Not on file  Occupational History   Occupation: retired  Tobacco Use   Smoking status: Former   Smokeless tobacco: Never   Tobacco comments:    quit smoking over 30 years ago  Vaping Use   Vaping status: Never Used  Substance and Sexual Activity   Alcohol use: No   Drug use: No   Sexual activity: Not on file  Other Topics Concern   Not on file  Social History Narrative   Lives with husband   Right Handed   Drinks 2-4 cups coffee   Social Determinants of Health   Financial Resource Strain: Low Risk  (03/19/2022)   Overall Financial Resource Strain (CARDIA)    Difficulty of Paying Living Expenses: Not hard at all  Food Insecurity: No Food Insecurity (03/19/2022)   Hunger Vital Sign    Worried About Running Out of Food in the Last Year: Never true    Ran Out of Food in the Last Year: Never true  Transportation Needs: No Transportation Needs (03/19/2022)   PRAPARE - Administrator, Civil Service (Medical): No    Lack of Transportation (Non-Medical): No  Physical Activity: Inactive (03/19/2022)   Exercise Vital Sign    Days of Exercise per Week: 0 days    Minutes of  Exercise per Session: 0 min  Stress: No Stress Concern Present (03/19/2022)   [employer] of Occupational Health - Occupational Stress Questionnaire    Feeling of Stress : Not at all  Social Connections: Socially Integrated (03/19/2022)   Social Connection and Isolation Panel [NHANES]    Frequency of Communication with Friends and Family: More than three times a week    Frequency of Social Gatherings with Friends and Family: More than three times a week    Attends Religious Services: More than 4 times per year    Active Member of [employer] or Organizations: Yes    Attends Engineer, structural: More than 4 times per year    Marital Status: Married     Observations/Objective: Appears well in NAD   Assessment and Plan:  See Problem List for Assessment and Plan of chronic medical problems.   Follow Up Instructions:    I discussed the assessment and treatment plan with the patient. The patient was provided an opportunity to ask questions and all were answered. The patient agreed with the plan and demonstrated an understanding of the instructions.   The patient was advised to call back or seek an in-person evaluation if the symptoms worsen or if the condition fails to improve as anticipated.    Pincus Sanes,  MD

## 2022-12-25 ENCOUNTER — Telehealth (INDEPENDENT_AMBULATORY_CARE_PROVIDER_SITE_OTHER): Payer: Medicare Other | Admitting: Internal Medicine

## 2022-12-25 DIAGNOSIS — J019 Acute sinusitis, unspecified: Secondary | ICD-10-CM | POA: Diagnosis not present

## 2022-12-25 MED ORDER — AMOXICILLIN-POT CLAVULANATE 875-125 MG PO TABS: 1.0000 | ORAL_TABLET | Freq: Two times a day (BID) | ORAL | 0 refills | Status: AC

## 2023-01-01 ENCOUNTER — Encounter: Payer: Self-pay | Admitting: Internal Medicine

## 2023-01-10 ENCOUNTER — Telehealth: Payer: Self-pay | Admitting: Internal Medicine

## 2023-01-10 NOTE — Telephone Encounter (Signed)
Patient dropped off document Patient Assistance Application, to be filled out by provider. Patient requested to send it back via Call Patient to pick up within 7-days. Document is located in providers tray at front office.Please advise at Mobile [phone] (mobile)

## 2023-01-11 NOTE — Telephone Encounter (Signed)
Placed on providers desk

## 2023-01-11 NOTE — Telephone Encounter (Signed)
Patient has picked up forms.

## 2023-01-11 NOTE — Telephone Encounter (Signed)
Called and let Pt know, paperwork is ready for pickup.

## 2023-02-01 ENCOUNTER — Encounter: Payer: Self-pay | Admitting: Internal Medicine

## 2023-02-08 ENCOUNTER — Telehealth: Payer: Self-pay

## 2023-02-08 NOTE — Telephone Encounter (Signed)
Pt Ozempic has arrived in office for pick up and has been placed in nurses station fridge.

## 2023-02-18 DIAGNOSIS — M5416 Radiculopathy, lumbar region: Secondary | ICD-10-CM | POA: Diagnosis not present

## 2023-02-21 DIAGNOSIS — Z85828 Personal history of other malignant neoplasm of skin: Secondary | ICD-10-CM | POA: Diagnosis not present

## 2023-02-21 DIAGNOSIS — L400 Psoriasis vulgaris: Secondary | ICD-10-CM | POA: Diagnosis not present

## 2023-02-26 DIAGNOSIS — E039 Hypothyroidism, unspecified: Secondary | ICD-10-CM | POA: Diagnosis not present

## 2023-02-26 DIAGNOSIS — I129 Hypertensive chronic kidney disease with stage 1 through stage 4 chronic kidney disease, or unspecified chronic kidney disease: Secondary | ICD-10-CM | POA: Diagnosis not present

## 2023-02-26 DIAGNOSIS — I10 Essential (primary) hypertension: Secondary | ICD-10-CM | POA: Diagnosis not present

## 2023-02-26 DIAGNOSIS — N183 Chronic kidney disease, stage 3 unspecified: Secondary | ICD-10-CM | POA: Diagnosis not present

## 2023-02-26 DIAGNOSIS — K219 Gastro-esophageal reflux disease without esophagitis: Secondary | ICD-10-CM | POA: Diagnosis not present

## 2023-02-26 DIAGNOSIS — E1122 Type 2 diabetes mellitus with diabetic chronic kidney disease: Secondary | ICD-10-CM | POA: Diagnosis not present

## 2023-02-26 LAB — LAB REPORT - SCANNED: EGFR: 39

## 2023-03-11 ENCOUNTER — Ambulatory Visit (INDEPENDENT_AMBULATORY_CARE_PROVIDER_SITE_OTHER): Payer: Medicare Other

## 2023-03-11 DIAGNOSIS — Z0000 Encounter for general adult medical examination without abnormal findings: Secondary | ICD-10-CM

## 2023-03-11 NOTE — Patient Instructions (Signed)
Heather Dawson , Thank you for taking time to come for your Medicare Wellness Visit. I appreciate your ongoing commitment to your health goals. Please review the following plan we discussed and let me know if I can assist you in the future.   Referrals/Orders/Follow-Ups/Clinician Recommendations: You are due for a Tetanus vaccine.  It was very nice to meet you today.  Each day, aim for 6 glasses of water, plenty of protein in your diet and try to get up and walk/ stretch every hour for 5-10 minutes at a time.    This is a list of the screening recommended for you and due dates:  Health Maintenance  Topic Date Due   DTaP/Tdap/Td vaccine (3 - Tdap) 08/31/2019   Flu Shot  09/20/2022   Mammogram  04/10/2023*   DEXA scan (bone density measurement)  04/10/2023*   Complete foot exam   04/10/2023   Hemoglobin A1C  04/10/2023   Eye exam for diabetics  06/14/2023   Yearly kidney health urinalysis for diabetes  11/20/2023   Yearly kidney function blood test for diabetes  02/26/2024   Medicare Annual Wellness Visit  03/10/2024   Colon Cancer Screening  11/28/2026   Pneumonia Vaccine  Completed   Hepatitis C Screening  Completed   HPV Vaccine  Aged Out   COVID-19 Vaccine  Discontinued   Zoster (Shingles) Vaccine  Discontinued  *Topic was postponed. The date shown is not the original due date.    Advanced directives: (Copy Requested) Please bring a copy of your health care power of attorney and living will to the office to be added to your chart at your convenience.  Next Medicare Annual Wellness Visit scheduled for next year: Yes

## 2023-03-11 NOTE — Progress Notes (Signed)
Subjective:   Heather Dawson is a 68 y.o. female who presents for Medicare Annual (Subsequent) preventive examination.  Visit Complete: In person   Cardiac Risk Factors include: advanced age (>11men, >72 women);diabetes mellitus;hypertension;Other (see comment);dyslipidemia, Risk factor comments: Aortic atherosclerosis, CKD, Transient cerebral ischemia     Objective:    There were no vitals filed for this visit. There is no height or weight on file to calculate BMI.     03/11/2023   10:56 AM 03/19/2022    8:51 AM 01/02/2022   12:54 PM 11/25/2021   10:00 PM 11/24/2021    7:40 PM 03/07/2021   11:54 AM 10/13/2020    6:00 AM  Advanced Directives  Does Patient Have a Medical Advance Directive? Yes Yes No Yes No No No  Type of Estate agent of [place];Living will Healthcare Power of [place];Living will  Living will;Healthcare Power of Attorney     Does patient want to make changes to medical advance directive?    No - Patient declined     Copy of Healthcare Power of Attorney in Chart? No - copy requested No - copy requested       Would patient like information on creating a medical advance directive?   No - Patient declined  No - Patient declined No - Patient declined No - Patient declined    Current Medications (verified) Outpatient Encounter Medications as of 03/11/2023  Medication Sig   acetaminophen (TYLENOL) 500 MG tablet Take 1,000 mg by mouth every 8 (eight) hours as needed (pain).   amLODipine (NORVASC) 10 MG tablet TAKE ONE TABLET BY MOUTH DAILY   Ascorbic Acid (VITAMIN C) 1000 MG tablet Take 1,000 mg by mouth daily.   aspirin EC 81 MG tablet Take 81 mg by mouth daily.   carvedilol (COREG) 3.125 MG tablet Take 3.125 mg by mouth 2 (two) times daily.   chlorpheniramine-HYDROcodone (TUSSIONEX) 10-8 MG/5ML Take 5 mLs by mouth every 12 (twelve) hours as needed for cough.   Cholecalciferol (VITAMIN D3) 1000 units CAPS Take 2 capsules (2,000 Units total) by mouth  daily.   clobetasol (TEMOVATE) 0.05 % external solution Apply 1 application topically daily as needed (scalp psoriasis).    estradiol (ESTRACE) 0.1 MG/GM vaginal cream Place 1 Applicatorful vaginally See admin instructions. Apply to vagina twice weekly on Monday, Thursday.   famotidine (PEPCID) 20 MG tablet TAKE 1 TABLET BY MOUTH TWICE A DAY   hydrALAZINE (APRESOLINE) 100 MG tablet TAKE ONE TABLET BY MOUTH THREE TIMES A DAY   levocetirizine (XYZAL) 5 MG tablet Take 5 mg by mouth every evening.   levothyroxine (SYNTHROID) 75 MCG tablet TAKE ONE TABLET BY MOUTH DAILY   lovastatin (MEVACOR) 40 MG tablet TAKE TWO TABLETS BY MOUTH AT BEDTIME   ondansetron (ZOFRAN-ODT) 4 MG disintegrating tablet Take 1 tablet (4 mg total) by mouth every 8 (eight) hours as needed for nausea or vomiting.   pantoprazole (PROTONIX) 40 MG tablet TAKE 1 TABLET BY MOUTH TWICE A DAY BEFORE A MEAL (Patient taking differently: 40 mg daily.)   Semaglutide, 1 MG/DOSE, 4 MG/3ML SOPN Inject 1 mg as directed once a week.   triamcinolone cream (KENALOG) 0.1 % Apply 1 application  topically daily as needed (psoriasis).   TURMERIC CURCUMIN PO Take 1 tablet by mouth daily.   No facility-administered encounter medications on file as of 03/11/2023.    Allergies (verified) Macrodantin [nitrofurantoin macrocrystal], Adipex-p [phentermine], Asa [aspirin], Bactrim [sulfamethoxazole-trimethoprim], Cipro [ciprofloxacin hcl], Levaquin [levofloxacin in d5w], Lipitor [atorvastatin],  Lyrica [pregabalin], Nsaids, Vibramycin [doxycycline], Zinacef [cefuroxime], and Neurontin [gabapentin]   History: Past Medical History:  Diagnosis Date   Abdominal pain, epigastric 12/20/2006   ALLERGIC RHINITIS 12/25/2006   Allergy    ANXIETY 10/16/2006   no per pt   Arthritis    right shoulder   BACK PAIN 06/15/2008   Blood transfusion without reported diagnosis    BRADYCARDIA 05/04/2008   C. difficile diarrhea    Carpal tunnel syndrome 10/16/2006    Cellulitis and abscess of other specified site 06/15/2008   CHEST PAIN 09/08/2009   Chronic low back pain    CIRRHOSIS 10/16/2006   CKD (chronic kidney disease)    2008- had Ecoli and caused renal failure, no problems since then   CKD (chronic kidney disease) stage 3, GFR 30-59 ml/min (HCC) 10/16/2006   Qualifier: Diagnosis of  By: Jonny Ruiz MD, Len Blalock    COMMON MIGRAINE 12/20/2006   Complication of anesthesia    awake during 2 surgeries   CONTUSION, LOWER LEG 09/15/2008   DEPRESSION 10/16/2006   no per pt   Diabetes mellitus without complication (HCC)    Esophageal reflux 12/25/2006   HYPERLIPIDEMIA 12/25/2006   Hyperlipidemia    HYPERTENSION 08/15/2006   Hypertension    HYPOTHYROIDISM 08/15/2006   Kidney failure, acute (HCC) 2009   as a result of severe E Coli infection   Left-sided carotid artery disease (HCC)    MIGRAINE HEADACHE 10/16/2006   Obesity 10/16/2006   OVARIAN CYST 12/20/2006   Psoriasis 01/03/2011   SHINGLES, HX OF 12/20/2006   Stroke (HCC)    3 strokes 2008-no deficits, only on ASA   TRANSIENT ISCHEMIC ATTACKS, HX OF 12/20/2006   VERTIGO 09/15/2007   VITAMIN B12 DEFICIENCY 01/09/2007   Past Surgical History:  Procedure Laterality Date   ABDOMINAL HYSTERECTOMY  02/2006   ANTERIOR CERVICAL DECOMP/DISCECTOMY FUSION  2005   BIOPSY  11/27/2021   Procedure: BIOPSY;  Surgeon: Benancio Deeds, MD;  Location: [facility] ENDOSCOPY;  Service: Gastroenterology;;   CESAREAN SECTION     CHOLECYSTECTOMY     COLONOSCOPY     COLONOSCOPY WITH PROPOFOL N/A 11/27/2021   Procedure: COLONOSCOPY WITH PROPOFOL;  Surgeon: Benancio Deeds, MD;  Location: [facility] ENDOSCOPY;  Service: Gastroenterology;  Laterality: N/A;   ENDARTERECTOMY Left 08/25/2014   Procedure: LEFT CAROTID ENDARTERECTOMY WITH HEMASHIELD PATCH ANGIOPLASTY;  Surgeon: Sherren Kerns, MD;  Location[facility] OR;  Service: Vascular;  Laterality: Left;   ESOPHAGOGASTRODUODENOSCOPY (EGD) WITH PROPOFOL N/A 11/27/2021   Procedure:  ESOPHAGOGASTRODUODENOSCOPY (EGD) WITH PROPOFOL;  Surgeon: Benancio Deeds, MD;  Location: [facility] ENDOSCOPY;  Service: Gastroenterology;  Laterality: N/A;   NECK SURGERY  2005   ROTATOR CUFF REPAIR Right 02/2014   [name]   Family History  Problem Relation Age of Onset   Stroke Father    Heart disease Father    Diabetes Father    Lupus Mother    Raynaud syndrome Mother    Heart disease Mother    Diabetes Sister    Heart disease Brother    Hypertension Brother    Heart disease Brother    Hypertension Brother    Diabetes Other    Hypertension Other    Colon cancer Neg Hx    Esophageal cancer Neg Hx    Rectal cancer Neg Hx    Stomach cancer Neg Hx    Social History   Socioeconomic History   Marital status: Married    Spouse name: Jake Shark   Number of children:  2   Years of education: Not on file   Highest education level: Not on file  Occupational History   Occupation: diabled/RETIRED  Tobacco Use   Smoking status: Former   Smokeless tobacco: Never   Tobacco comments:    quit smoking over 30 years ago  Vaping Use   Vaping status: Never Used  Substance and Sexual Activity   Alcohol use: No   Drug use: No   Sexual activity: Not on file  Other Topics Concern   Not on file  Social History Narrative   Lives with husband-2025   Right Handed   Drinks 2-4 cups coffee   Social Drivers of Health   Financial Resource Strain: Low Risk  (03/11/2023)   Overall Financial Resource Strain (CARDIA)    Difficulty of Paying Living Expenses: Not hard at all  Food Insecurity: No Food Insecurity (03/11/2023)   Hunger Vital Sign    Worried About Running Out of Food in the Last Year: Never true    Ran Out of Food in the Last Year: Never true  Transportation Needs: No Transportation Needs (03/11/2023)   PRAPARE - Administrator, Civil Service (Medical): No    Lack of Transportation (Non-Medical): No  Physical Activity: Inactive (03/11/2023)   Exercise Vital Sign    Days  of Exercise per Week: 0 days    Minutes of Exercise per Session: 0 min  Stress: No Stress Concern Present (03/11/2023)   [employer] of Occupational Health - Occupational Stress Questionnaire    Feeling of Stress : Not at all  Social Connections: Socially Integrated (03/11/2023)   Social Connection and Isolation Panel [NHANES]    Frequency of Communication with Friends and Family: More than three times a week    Frequency of Social Gatherings with Friends and Family: Twice a week    Attends Religious Services: More than 4 times per year    Active Member of [employer] or Organizations: Yes    Attends Banker Meetings: Never    Marital Status: Married    Tobacco Counseling Counseling given: Not Answered Tobacco comments: quit smoking over 30 years ago   Clinical Intake:  Pre-visit preparation completed: Yes  Pain : No/denies pain     Nutritional Risks: None Diabetes: Yes CBG done?: Yes (wears a dexcom-128) CBG resulted in Enter/ Edit results?: No Did pt. bring in CBG monitor from home?: No  How often do you need to have someone help you when you read instructions, pamphlets, or other written materials from your doctor or pharmacy?: 1 - Never  Interpreter Needed?: No  Information entered by :: Kylea Berrong, RMA   Activities of Daily Living    03/11/2023   10:44 AM 03/19/2022    8:53 AM  In your present state of health, do you have any difficulty performing the following activities:  Hearing? 0 0  Vision? 0 0  Difficulty concentrating or making decisions? 0 0  Walking or climbing stairs? 0 0  Dressing or bathing? 0 0  Doing errands, shopping? 0 0  Preparing Food and eating ? N N  Using the Toilet? N N  In the past six months, have you accidently leaked urine? Y N  Do you have problems with loss of bowel control? N N  Managing your Medications? N N  Managing your Finances? N N  Housekeeping or managing your Housekeeping? N N    Patient Care  Team: Corwin Levins, MD as PCP - General (Internal Medicine)  Runell Gess, MD as PCP - Cardiology (Cardiology) Runell Gess, MD as Consulting Physician (Cardiology) Louis Meckel, MD (Inactive) as Consulting Physician (Gastroenterology) Beverely Low, MD as Consulting Physician (Orthopedic Surgery) Venita Lick, MD as Consulting Physician (Orthopedic Surgery)  Indicate any recent Medical Services you may have received from other than [facility] providers in the past year (date may be approximate).     Assessment:   This is a routine wellness examination for Heather Dawson.  Hearing/Vision screen Hearing Screening - Comments:: Denies hearing difficulties   Vision Screening - Comments:: Wears eyeglasses   Goals Addressed             This Visit's Progress    Client understands the importance of follow-up with providers by attending scheduled visits   On track     Depression Screen    03/11/2023   11:03 AM 10/08/2022    9:31 AM 04/09/2022    8:52 AM 04/09/2022    8:17 AM 03/19/2022    8:53 AM 01/09/2022    8:04 AM 12/05/2021    8:15 AM  PHQ 2/9 Scores  PHQ - 2 Score 0 0 0 0 0 0   PHQ- 9 Score 2   0  0   Exception Documentation       Patient refusal    Fall Risk    03/11/2023   10:58 AM 10/08/2022    9:31 AM 04/09/2022    8:52 AM 04/09/2022    8:17 AM 03/19/2022    8:52 AM  Fall Risk   Falls in the past year? 0 0 0 0 0  Number falls in past yr: 0 0 0 0 0  Injury with Fall? 0 0 0 0 0  Risk for fall due to : No Fall Risks No Fall Risks  No Fall Risks No Fall Risks  Follow up Falls prevention discussed;Falls evaluation completed Falls evaluation completed  Falls evaluation completed Falls prevention discussed    MEDICARE RISK AT HOME: Medicare Risk at Home Any stairs in or around the home?: Yes (outside) If so, are there any without handrails?: Yes Home free of loose throw rugs in walkways, pet beds, electrical cords, etc?: Yes Adequate lighting in your home to reduce  risk of falls?: Yes Life alert?: No Use of a cane, walker or w/c?: No Grab bars in the bathroom?: No Shower chair or bench in shower?: Yes Elevated toilet seat or a handicapped toilet?: No  TIMED UP AND GO:  Was the test performed?  Yes  Length of time to ambulate 10 feet: 20 sec Gait steady and fast without use of assistive device    Cognitive Function:        03/11/2023   10:44 AM 03/19/2022    8:53 AM  6CIT Screen  What Year? 0 points 0 points  What month? 0 points 0 points  What time? 0 points 0 points  Count back from 20 0 points 0 points  Months in reverse 0 points 0 points  Repeat phrase 0 points 0 points  Total Score 0 points 0 points    Immunizations Immunization History  Administered Date(s) Administered   Fluad Quad(high Dose 65+) 12/05/2021   Influenza Split 01/03/2011   Influenza Whole 12/28/2005, 01/07/2007, 11/18/2007, 11/18/2008   Influenza,inj,Quad PF,6+ Mos 12/30/2012, 11/06/2013, 11/10/2014, 12/20/2015, 11/13/2016, 10/24/2017, 10/31/2018, 03/23/2020, 12/27/2020   Influenza-Unspecified 10/29/2018, 11/19/2020   PFIZER Comirnaty(Gray Top)Covid-19 Tri-Sucrose Vaccine 07/03/2019, 07/24/2019, 02/04/2020   PFIZER(Purple Top)SARS-COV-2 Vaccination 07/03/2019, 07/24/2019, 02/04/2020   PNEUMOCOCCAL CONJUGATE-20  10/08/2022   Pneumococcal Conjugate-13 05/03/2016   Pneumococcal Polysaccharide-23 04/23/2017   Td 08/30/2009   Td (Adult), 2 Lf Tetanus Toxid, Preservative Free 08/30/2009   Zoster Recombinant(Shingrix) 04/23/2017, 04/05/2020    TDAP status: Due, Education has been provided regarding the importance of this vaccine. Advised may receive this vaccine at local pharmacy or Health Dept. Aware to provide a copy of the vaccination record if obtained from local pharmacy or Health Dept. Verbalized acceptance and understanding.  Flu Vaccine status: Due, Education has been provided regarding the importance of this vaccine. Advised may receive this vaccine at local  pharmacy or Health Dept. Aware to provide a copy of the vaccination record if obtained from local pharmacy or Health Dept. Verbalized acceptance and understanding.  Pneumococcal vaccine status: Up to date  Covid-19 vaccine status: Declined, Education has been provided regarding the importance of this vaccine but patient still declined. Advised may receive this vaccine at local pharmacy or Health Dept.or vaccine clinic. Aware to provide a copy of the vaccination record if obtained from local pharmacy or Health Dept. Verbalized acceptance and understanding.  Qualifies for Shingles Vaccine? Yes   Zostavax completed Yes   Shingrix Completed?: Yes  Screening Tests Health Maintenance  Topic Date Due   DTaP/Tdap/Td (3 - Tdap) 08/31/2019   INFLUENZA VACCINE  09/20/2022   MAMMOGRAM  04/10/2023 (Originally 03/22/2022)   DEXA SCAN  04/10/2023 (Originally 11/24/2021)   FOOT EXAM  04/10/2023   HEMOGLOBIN A1C  04/10/2023   OPHTHALMOLOGY EXAM  06/14/2023   Diabetic kidney evaluation - Urine ACR  11/20/2023   Diabetic kidney evaluation - eGFR measurement  02/26/2024   Medicare Annual Wellness (AWV)  03/10/2024   Colonoscopy  11/28/2026   Pneumonia Vaccine 61+ Years old  Completed   Hepatitis C Screening  Completed   HPV VACCINES  Aged Out   COVID-19 Vaccine  Discontinued   Zoster Vaccines- Shingrix  Discontinued    Health Maintenance  Health Maintenance Due  Topic Date Due   DTaP/Tdap/Td (3 - Tdap) 08/31/2019   INFLUENZA VACCINE  09/20/2022    Colorectal cancer screening: Type of screening: Colonoscopy. Completed 11/27/2021. Repeat every 5 years  Mammogram status: Completed 04/09/2022. Repeat every year   Lung Cancer Screening: (Low Dose CT Chest recommended if Age 36-80 years, 20 pack-year currently smoking OR have quit w/in 15years.) does not qualify.   Lung Cancer Screening Referral: N/A  Additional Screening:  Hepatitis C Screening: does qualify; Completed 04/27/2015  Vision  Screening: Recommended annual ophthalmology exams for early detection of glaucoma and other disorders of the eye. Is the patient up to date with their annual eye exam?  Yes  Who is the provider or what is the name of the office in which the patient attends annual eye exams? [facility] (Dr. Harriette Bouillon) If pt is not established with a provider, would they like to be referred to a provider to establish care? No .   Dental Screening: Recommended annual dental exams for proper oral hygiene  Diabetic Foot Exam: Diabetic Foot Exam: Completed 04/09/2022  Community Resource Referral / Chronic Care Management: CRR required this visit?  No   CCM required this visit?  No     Plan:     I have personally reviewed and noted the following in the patient's chart:   Medical and social history Use of alcohol, tobacco or illicit drugs  Current medications and supplements including opioid prescriptions. Patient is not currently taking opioid prescriptions. Functional ability and status Nutritional status Physical activity  Advanced directives List of other physicians Hospitalizations, surgeries, and ER visits in previous 12 months Vitals Screenings to include cognitive, depression, and falls Referrals and appointments  In addition, I have reviewed and discussed with patient certain preventive protocols, quality metrics, and best practice recommendations. A written personalized care plan for preventive services as well as general preventive health recommendations were provided to patient.     Heather Dawson, CMA   03/11/2023   After Visit Summary: (MyChart) Due to this being a telephonic visit, the after visit summary with patients personalized plan was offered to patient via MyChart   Nurse Notes: Patient is due for a Tdap and she will schedule her bone density when she schedules for a mammogram this year.  Patient's chart is showing that she is due for a flu vaccine, however patient stated that  she received it during her last in office visit.  The vaccine has not been documented in [facility] or patient's chart (Please update chart). She had no concerns to address today.

## 2023-04-04 ENCOUNTER — Ambulatory Visit: Payer: Medicare Other

## 2023-04-04 ENCOUNTER — Other Ambulatory Visit (INDEPENDENT_AMBULATORY_CARE_PROVIDER_SITE_OTHER): Payer: Medicare Other

## 2023-04-04 DIAGNOSIS — E669 Obesity, unspecified: Secondary | ICD-10-CM

## 2023-04-04 DIAGNOSIS — E559 Vitamin D deficiency, unspecified: Secondary | ICD-10-CM | POA: Diagnosis not present

## 2023-04-04 DIAGNOSIS — E538 Deficiency of other specified B group vitamins: Secondary | ICD-10-CM | POA: Diagnosis not present

## 2023-04-04 DIAGNOSIS — E1169 Type 2 diabetes mellitus with other specified complication: Secondary | ICD-10-CM | POA: Diagnosis not present

## 2023-04-04 LAB — URINALYSIS, ROUTINE W REFLEX MICROSCOPIC
Hgb urine dipstick: NEGATIVE
Nitrite: NEGATIVE
Specific Gravity, Urine: 1.02 (ref 1.000–1.030)
Urine Glucose: NEGATIVE
Urobilinogen, UA: 0.2 (ref 0.0–1.0)
pH: 6 (ref 5.0–8.0)

## 2023-04-04 LAB — MICROALBUMIN / CREATININE URINE RATIO
Creatinine,U: 320.8 mg/dL
Microalb Creat Ratio: 26 mg/g (ref 0.0–30.0)
Microalb, Ur: 8.3 mg/dL — ABNORMAL HIGH (ref 0.0–1.9)

## 2023-04-04 LAB — HEMOGLOBIN A1C: Hgb A1c MFr Bld: 5.4 % (ref 4.6–6.5)

## 2023-04-04 LAB — LIPID PANEL
Cholesterol: 125 mg/dL (ref 0–200)
HDL: 41.7 mg/dL (ref >39.00)
LDL Cholesterol: 56 mg/dL (ref 0–99)
NonHDL: 82.82
Total CHOL/HDL Ratio: 3
Triglycerides: 132 mg/dL (ref 0.0–149.0)
VLDL: 26.4 mg/dL (ref 0.0–40.0)

## 2023-04-04 LAB — CBC WITH DIFFERENTIAL/PLATELET
Basophils Absolute: 0 10*3/uL (ref 0.0–0.1)
Basophils Relative: 0.5 % (ref 0.0–3.0)
Eosinophils Absolute: 0.2 10*3/uL (ref 0.0–0.7)
Eosinophils Relative: 2.6 % (ref 0.0–5.0)
HCT: 42 % (ref 36.0–46.0)
Hemoglobin: 14.1 g/dL (ref 12.0–15.0)
Lymphocytes Relative: 15.5 % (ref 12.0–46.0)
Lymphs Abs: 1.1 10*3/uL (ref 0.7–4.0)
MCHC: 33.6 g/dL (ref 30.0–36.0)
MCV: 95.6 fL (ref 78.0–100.0)
Monocytes Absolute: 0.4 10*3/uL (ref 0.1–1.0)
Monocytes Relative: 5.6 % (ref 3.0–12.0)
Neutro Abs: 5.4 10*3/uL (ref 1.4–7.7)
Neutrophils Relative %: 75.8 % (ref 43.0–77.0)
Platelets: 259 10*3/uL (ref 150.0–400.0)
RBC: 4.4 Mil/uL (ref 3.87–5.11)
RDW: 12.8 % (ref 11.5–15.5)
WBC: 7.1 10*3/uL (ref 4.0–10.5)

## 2023-04-04 LAB — HEPATIC FUNCTION PANEL
ALT: 9 U/L (ref 0–35)
AST: 16 U/L (ref 0–37)
Albumin: 4.3 g/dL (ref 3.5–5.2)
Alkaline Phosphatase: 45 U/L (ref 39–117)
Bilirubin, Direct: 0.2 mg/dL (ref 0.0–0.3)
Total Bilirubin: 0.7 mg/dL (ref 0.2–1.2)
Total Protein: 6.7 g/dL (ref 6.0–8.3)

## 2023-04-04 LAB — BASIC METABOLIC PANEL
BUN: 16 mg/dL (ref 6–23)
CO2: 28 meq/L (ref 19–32)
Calcium: 9.4 mg/dL (ref 8.4–10.5)
Chloride: 104 meq/L (ref 96–112)
Creatinine, Ser: 1.37 mg/dL — ABNORMAL HIGH (ref 0.40–1.20)
GFR: 40.28 mL/min — ABNORMAL LOW (ref >60.00)
Glucose, Bld: 92 mg/dL (ref 70–99)
Potassium: 3.7 meq/L (ref 3.5–5.1)
Sodium: 142 meq/L (ref 135–145)

## 2023-04-04 LAB — VITAMIN D 25 HYDROXY (VIT D DEFICIENCY, FRACTURES): VITD: 67.9 ng/mL (ref 30.00–100.00)

## 2023-04-04 LAB — VITAMIN B12: Vitamin B-12: >1537 pg/mL — ABNORMAL HIGH (ref 211–911)

## 2023-04-04 LAB — TSH: TSH: 2.93 u[IU]/mL (ref 0.35–5.50)

## 2023-04-11 ENCOUNTER — Ambulatory Visit: Payer: Medicare Other | Admitting: Internal Medicine

## 2023-04-18 ENCOUNTER — Encounter: Payer: Self-pay | Admitting: Internal Medicine

## 2023-04-18 ENCOUNTER — Ambulatory Visit: Payer: Medicare Other | Admitting: Internal Medicine

## 2023-04-18 VITALS — BP 128/82 | HR 65 | Temp 98.7°F | Ht 62.0 in | Wt 157.0 lb

## 2023-04-18 DIAGNOSIS — B370 Candidal stomatitis: Secondary | ICD-10-CM | POA: Diagnosis not present

## 2023-04-18 DIAGNOSIS — E039 Hypothyroidism, unspecified: Secondary | ICD-10-CM

## 2023-04-18 DIAGNOSIS — Z0001 Encounter for general adult medical examination with abnormal findings: Secondary | ICD-10-CM | POA: Diagnosis not present

## 2023-04-18 DIAGNOSIS — F411 Generalized anxiety disorder: Secondary | ICD-10-CM

## 2023-04-18 DIAGNOSIS — J309 Allergic rhinitis, unspecified: Secondary | ICD-10-CM

## 2023-04-18 DIAGNOSIS — E538 Deficiency of other specified B group vitamins: Secondary | ICD-10-CM

## 2023-04-18 DIAGNOSIS — K219 Gastro-esophageal reflux disease without esophagitis: Secondary | ICD-10-CM

## 2023-04-18 DIAGNOSIS — E1169 Type 2 diabetes mellitus with other specified complication: Secondary | ICD-10-CM

## 2023-04-18 DIAGNOSIS — E782 Mixed hyperlipidemia: Secondary | ICD-10-CM

## 2023-04-18 DIAGNOSIS — E669 Obesity, unspecified: Secondary | ICD-10-CM

## 2023-04-18 DIAGNOSIS — I10 Essential (primary) hypertension: Secondary | ICD-10-CM

## 2023-04-18 DIAGNOSIS — N184 Chronic kidney disease, stage 4 (severe): Secondary | ICD-10-CM

## 2023-04-18 DIAGNOSIS — Z7985 Long-term (current) use of injectable non-insulin antidiabetic drugs: Secondary | ICD-10-CM

## 2023-04-18 DIAGNOSIS — E559 Vitamin D deficiency, unspecified: Secondary | ICD-10-CM

## 2023-04-18 MED ORDER — NYSTATIN 100000 UNIT/ML MT SUSP: 500000.0000 [IU] | Freq: Four times a day (QID) | OROMUCOSAL | 1 refills | Status: AC

## 2023-04-18 MED ORDER — PANTOPRAZOLE SODIUM 40 MG PO TBEC: 40.0000 mg | DELAYED_RELEASE_TABLET | Freq: Every day | ORAL | 3 refills | Status: DC

## 2023-04-18 NOTE — Patient Instructions (Addendum)
 You had the flu shot today  Ok to take the B12 to 3 times weekly only  Please take all new medication as prescribed - the nystatin for the rash  Ok for pantoprazole to 1 per day only  Please continue all other medications as before, and refills have been done if requested.  Please have the pharmacy call with any other refills you may need.  Please continue your efforts at being more active, low cholesterol diet, and weight control.  You are otherwise up to date with prevention measures today.  Please keep your appointments with your specialists as you may have planned  Your lab testing was otherwise very good !  Please make an Appointment to return in 6 months, or sooner if needed, also with Lab Appointment for testing done 3-5 days before at the FIRST FLOOR Lab (so this is for TWO appointments - please see the scheduling desk as you leave)

## 2023-04-18 NOTE — Progress Notes (Signed)
 Patient ID: Heather Dawson, female   DOB: September 14, 1955, 68 y.o.   MRN: [id number]         Chief Complaint:: wellness exam and dm, ckd3b, htn, hld, low b12, gerd       HPI:  Heather Dawson is a 68 y.o. female here for wellness exam; next eye exam sept 2025. ; will have dxa and mammogram for april28 with GYN; o/w up to date, for flu shot today                        Also walking 3 days per wk, 40 min for 2 miles.  Pt denies chest pain, increased sob or doe, wheezing, orthopnea, PND, increased LE swelling, palpitations, dizziness or syncope.   Pt denies polydipsia, polyuria, or new focal neuro s/s.    Pt denies fever, wt loss, night sweats, loss of appetite, or other constitutional symptoms   Sees renal every 4 months . Lost 125 lbs with ozempic recently, peak wt has been 196 with start ozempic in July 2024. Denies worsening depressive symptoms, suicidal ideation, or panic; has ongoing anxiety, not increased recently.  Does have several wks ongoing nasal allergy symptoms with clearish congestion, itch and sneezing, without fever, pain, ST, cough, swelling or wheezing.  Does have thrush worsening to the tongue this week.  Denies worsening reflux, abd pain, dysphagia, n/v, bowel change or blood.  Wt Readings from Last 3 Encounters:  04/18/23 157 lb (71.2 kg)  11/23/22 174 lb (78.9 kg)  10/08/22 183 lb (83 kg)   BP Readings from Last 3 Encounters:  04/18/23 128/82  11/23/22 120/80  10/08/22 126/78   Immunization History  Administered Date(s) Administered   Fluad Quad(high Dose 65+) 12/05/2021   Influenza Split 01/03/2011   Influenza Whole 12/28/2005, 01/07/2007, 11/18/2007, 11/18/2008   Influenza,inj,Quad PF,6+ Mos 12/30/2012, 11/06/2013, 11/10/2014, 12/20/2015, 11/13/2016, 10/24/2017, 10/31/2018, 03/23/2020, 12/27/2020   Influenza-Unspecified 10/29/2018, 11/19/2020   PFIZER Comirnaty(Gray Top)Covid-19 Tri-Sucrose Vaccine 07/03/2019, 07/24/2019, 02/04/2020   PFIZER(Purple Top)SARS-COV-2 Vaccination  07/03/2019, 07/24/2019, 02/04/2020   PNEUMOCOCCAL CONJUGATE-20 10/08/2022   Pneumococcal Conjugate-13 05/03/2016   Pneumococcal Polysaccharide-23 04/23/2017   Td 08/30/2009   Td (Adult), 2 Lf Tetanus Toxid, Preservative Free 08/30/2009   Zoster Recombinant(Shingrix) 04/23/2017, 04/05/2020   Health Maintenance Due  Topic Date Due   DTaP/Tdap/Td (3 - Tdap) 08/31/2019   DEXA SCAN  Never done   INFLUENZA VACCINE  09/20/2022      Past Medical History:  Diagnosis Date   Abdominal pain, epigastric 12/20/2006   ALLERGIC RHINITIS 12/25/2006   Allergy    ANXIETY 10/16/2006   no per pt   Arthritis    right shoulder   BACK PAIN 06/15/2008   Blood transfusion without reported diagnosis    BRADYCARDIA 05/04/2008   C. difficile diarrhea    Carpal tunnel syndrome 10/16/2006   Cellulitis and abscess of other specified site 06/15/2008   CHEST PAIN 09/08/2009   Chronic low back pain    CIRRHOSIS 10/16/2006   CKD (chronic kidney disease)    2008- had Ecoli and caused renal failure, no problems since then   CKD (chronic kidney disease) stage 3, GFR 30-59 ml/min (HCC) 10/16/2006   Qualifier: Diagnosis of  By: Jonny Ruiz MD, Len Blalock    COMMON MIGRAINE 12/20/2006   Complication of anesthesia    awake during 2 surgeries   CONTUSION, LOWER LEG 09/15/2008   DEPRESSION 10/16/2006   no per pt   Diabetes mellitus without complication (HCC)  Esophageal reflux 12/25/2006   HYPERLIPIDEMIA 12/25/2006   Hyperlipidemia    HYPERTENSION 08/15/2006   Hypertension    HYPOTHYROIDISM 08/15/2006   Kidney failure, acute (HCC) 2009   as a result of severe E Coli infection   Left-sided carotid artery disease (HCC)    MIGRAINE HEADACHE 10/16/2006   Obesity 10/16/2006   OVARIAN CYST 12/20/2006   Psoriasis 01/03/2011   SHINGLES, HX OF 12/20/2006   Stroke (HCC)    3 strokes 2008-no deficits, only on ASA   TRANSIENT ISCHEMIC ATTACKS, HX OF 12/20/2006   VERTIGO 09/15/2007   VITAMIN B12 DEFICIENCY 01/09/2007   Past Surgical  History:  Procedure Laterality Date   ABDOMINAL HYSTERECTOMY  02/2006   ANTERIOR CERVICAL DECOMP/DISCECTOMY FUSION  2005   BIOPSY  11/27/2021   Procedure: BIOPSY;  Surgeon: Benancio Deeds, MD;  Location: [facility] ENDOSCOPY;  Service: Gastroenterology;;   CESAREAN SECTION     CHOLECYSTECTOMY     COLONOSCOPY     COLONOSCOPY WITH PROPOFOL N/A 11/27/2021   Procedure: COLONOSCOPY WITH PROPOFOL;  Surgeon: Benancio Deeds, MD;  Location[facility] ENDOSCOPY;  Service: Gastroenterology;  Laterality: N/A;   ENDARTERECTOMY Left 08/25/2014   Procedure: LEFT CAROTID ENDARTERECTOMY WITH HEMASHIELD PATCH ANGIOPLASTY;  Surgeon: Sherren Kerns, MD;  Location: [facility] OR;  Service: Vascular;  Laterality: Left;   ESOPHAGOGASTRODUODENOSCOPY (EGD) WITH PROPOFOL N/A 11/27/2021   Procedure: ESOPHAGOGASTRODUODENOSCOPY (EGD) WITH PROPOFOL;  Surgeon: Benancio Deeds, MD;  Location: [facility] END[facility];  Service: Gastroenterology;  Laterality: N/A;   NECK SURGERY  2005   ROTATOR CUFF REPAIR Right 02/2014   [name]    reports that she has quit smoking. She has never used smokeless tobacco. She reports that she does not drink alcohol and does not use drugs. family history includes Diabetes in her father, sister, and another family member; Heart disease in her brother, brother, father, and mother; Hypertension in her brother, brother and another family member; Lupus in her mother; Raynaud syndrome in her mother; Stroke in her father. Allergies  Allergen Reactions   Macrodantin [Nitrofurantoin Macrocrystal] Shortness Of Breath, Diarrhea and Other (See Comments)    GI upset   Adipex-P [Phentermine] Other (See Comments)    "All the side effects listed on the paper"   Asa [Aspirin] Other (See Comments)    Told to avoid due to kidney function   Bactrim [Sulfamethoxazole-Trimethoprim] Nausea And Vomiting   Cipro [Ciprofloxacin Hcl] Swelling   Levaquin [Levofloxacin In D5w] Other (See Comments)    GI upset   Lipitor [Atorvastatin]  Other (See Comments)    Myalgia    Lyrica [Pregabalin] Swelling and Other (See Comments)    Headaches Confusion   Nsaids Other (See Comments)    Told to avoid due to kidney function   Vibramycin [Doxycycline] Nausea Only   Zinacef [Cefuroxime] Swelling   Neurontin [Gabapentin] Rash   Current Outpatient Medications on File Prior to Visit  Medication Sig Dispense Refill   acetaminophen (TYLENOL) 500 MG tablet Take 1,000 mg by mouth every 8 (eight) hours as needed (pain).     amLODipine (NORVASC) 10 MG tablet TAKE ONE TABLET BY MOUTH DAILY 90 tablet 3   Ascorbic Acid (VITAMIN C) 1000 MG tablet Take 1,000 mg by mouth daily.     aspirin EC 81 MG tablet Take 81 mg by mouth daily.     carvedilol (COREG) 3.125 MG tablet Take 3.125 mg by mouth 2 (two) times daily.     chlorpheniramine-HYDROcodone (TUSSIONEX) 10-8 MG/5ML Take 5 mLs by  mouth every 12 (twelve) hours as needed for cough. 115 mL 0   Cholecalciferol (VITAMIN D3) 1000 units CAPS Take 2 capsules (2,000 Units total) by mouth daily. 60 capsule 0   clobetasol (TEMOVATE) 0.05 % external solution Apply 1 application topically daily as needed (scalp psoriasis).   0   Continuous Glucose Sensor (DEXCOM G7 SENSOR) MISC      estradiol (ESTRACE) 0.1 MG/GM vaginal cream Place 1 Applicatorful vaginally See admin instructions. Apply to vagina twice weekly on Monday, Thursday.     famotidine (PEPCID) 20 MG tablet TAKE 1 TABLET BY MOUTH TWICE A DAY 180 tablet 3   Fluocinolone Acetonide Body 0.01 % OIL Apply topically.     hydrALAZINE (APRESOLINE) 100 MG tablet TAKE ONE TABLET BY MOUTH THREE TIMES A DAY 90 tablet 11   levocetirizine (XYZAL) 5 MG tablet Take 5 mg by mouth every evening.     levothyroxine (SYNTHROID) 75 MCG tablet TAKE ONE TABLET BY MOUTH DAILY 90 tablet 3   lovastatin (MEVACOR) 40 MG tablet TAKE TWO TABLETS BY MOUTH AT BEDTIME 180 tablet 2   ondansetron (ZOFRAN-ODT) 4 MG disintegrating tablet Take 1 tablet (4 mg total) by mouth every 8  (eight) hours as needed for nausea or vomiting. 30 tablet 1   Semaglutide, 1 MG/DOSE, 4 MG/3ML SOPN Inject 1 mg as directed once a week. 3 mL 11   triamcinolone cream (KENALOG) 0.1 % Apply 1 application  topically daily as needed (psoriasis).     TURMERIC CURCUMIN PO Take 1 tablet by mouth daily.     No current facility-administered medications on file prior to visit.        ROS:  All others reviewed and negative.  Objective        PE:  BP 128/82 (BP Location: Right Arm, Patient Position: Sitting, Cuff Size: Normal)   Pulse 65   Temp 98.7 F (37.1 C) (Oral)   Ht 5\' 2"  (1.575 m)   Wt 157 lb (71.2 kg)   SpO2 99%   BMI 28.72 kg/m                 Constitutional: Pt appears in NAD               HENT: Head: NCAT.                Right Ear: External ear normal.                 Left Ear: External ear normal.                Eyes: . Pupils are equal, round, and reactive to light. Conjunctivae and EOM are normal               Nose: without d/c or deformity               Neck: Neck supple. Gross normal ROM               Cardiovascular: Normal rate and regular rhythm.                 Pulmonary/Chest: Effort normal and breath sounds without rales or wheezing.                Abd:  Soft, NT, ND, + BS, no organomegaly               Neurological: Pt is alert. At baseline orientation, motor grossly intact  Skin: Skin is warm. No rashes, no other new lesions, LE edema - none               Psychiatric: Pt behavior is normal without agitation   Micro: none  Cardiac tracings I have personally interpreted today:  none  Pertinent Radiological findings (summarize): none   Lab Results  Component Value Date   WBC 7.1 04/04/2023   HGB 14.1 04/04/2023   HCT 42.0 04/04/2023   PLT 259.0 04/04/2023   GLUCOSE 92 04/04/2023   CHOL 125 04/04/2023   TRIG 132.0 04/04/2023   HDL 41.70 04/04/2023   LDLDIRECT 107.0 10/06/2021   LDLCALC 56 04/04/2023   ALT 9 04/04/2023   AST 16 04/04/2023    NA 142 04/04/2023   K 3.7 04/04/2023   CL 104 04/04/2023   CREATININE 1.37 (H) 04/04/2023   BUN 16 04/04/2023   CO2 28 04/04/2023   TSH 2.93 04/04/2023   INR 0.99 08/17/2014   HGBA1C 5.4 04/04/2023   MICROALBUR 8.3 (H) 04/04/2023   Assessment/Plan:  Heather Dawson is a 68 y.o. White or Caucasian [1] female with  has a past medical history of Abdominal pain, epigastric (12/20/2006), ALLERGIC RHINITIS (12/25/2006), Allergy, ANXIETY (10/16/2006), Arthritis, BACK PAIN (06/15/2008), Blood transfusion without reported diagnosis, BRADYCARDIA (05/04/2008), C. difficile diarrhea, Carpal tunnel syndrome (10/16/2006), Cellulitis and abscess of other specified site (06/15/2008), CHEST PAIN (09/08/2009), Chronic low back pain, CIRRHOSIS (10/16/2006), CKD (chronic kidney disease), CKD (chronic kidney disease) stage 3, GFR 30-59 ml/min (HCC) (10/16/2006), COMMON MIGRAINE (12/20/2006), Complication of anesthesia, CONTUSION, LOWER LEG (09/15/2008), DEPRESSION (10/16/2006), Diabetes mellitus without complication (HCC), Esophageal reflux (12/25/2006), HYPERLIPIDEMIA (12/25/2006), Hyperlipidemia, HYPERTENSION (08/15/2006), Hypertension, HYPOTHYROIDISM (08/15/2006), Kidney failure, acute (HCC) (2009), Left-sided carotid artery disease (HCC), MIGRAINE HEADACHE (10/16/2006), Obesity (10/16/2006), OVARIAN CYST (12/20/2006), Psoriasis (01/03/2011), SHINGLES, HX OF (12/20/2006), Stroke (HCC), TRANSIENT ISCHEMIC ATTACKS, HX OF (12/20/2006), VERTIGO (09/15/2007), and VITAMIN B12 DEFICIENCY (01/09/2007).  Encounter for well adult exam with abnormal findings Age and sex appropriate education and counseling updated with regular exercise and diet Referrals for preventative services - for eye exam sept 2025, for dxa and mammogram with GYN in april Immunizations addressed - for flu shot today Smoking counseling  - none needed Evidence for depression or other mood disorder - chronic anxiety stable overall Most recent labs reviewed. I have personally  reviewed and have noted: 1) the patient's medical and social history 2) The patient's current medications and supplements 3) The patient's height, weight, and BMI have been recorded in the chart   Allergic rhinitis Mild to mod, for otc nasacort asd,  to f/u any worsening symptoms or concerns  Anxiety state Overall stable, cont current med tx.   B12 deficiency Lab Results  Component Value Date   VITAMINB12 >1537 (H) 04/04/2023   Overcontrolled, cont oral replacement - b12 1000 mcg at reduced to 3 times per wk  CKD (chronic kidney disease) stage 4, GFR 15-29 ml/min (HCC) Lab Results  Component Value Date   CREATININE 1.37 (H) 04/04/2023   Stable overall, cont to avoid nephrotoxins, f/u renal as planned  Essential hypertension BP Readings from Last 3 Encounters:  04/18/23 128/82  11/23/22 120/80  10/08/22 126/78   Stable, pt to continue medical treatment norvasc 10 every day, coreg 3.125 bid, hydralazine 100 tid,    Hyperlipidemia Lab Results  Component Value Date   LDLCALC 56 04/04/2023   Stable, pt to continue current statin lovastatin 80 qhs   Hypothyroidism Lab Results  Component Value Date  TSH 2.93 04/04/2023   Stable, pt to continue levothyroxine 75 mcg qd   Type 2 diabetes mellitus with obesity (HCC) Lab Results  Component Value Date   HGBA1C 5.4 04/04/2023   Stable, pt to continue current medical treatment ozempic 1 mg weekly,   GERD (gastroesophageal reflux disease) Doing well currently, ok for reduced protonix to 40 qd  Thrush Mild to mod, for nystatin soln asd,  to f/u any worsening symptoms or concerns  Followup: No follow-ups on file.  Oliver Barre, MD 04/21/2023 5:44 PM [facility]

## 2023-04-21 NOTE — Assessment & Plan Note (Signed)
 Lab Results  Component Value Date   HGBA1C 5.4 04/04/2023   Stable, pt to continue current medical treatment ozempic 1 mg weekly,

## 2023-04-21 NOTE — Assessment & Plan Note (Signed)
Mild to mod, for otc nasacort asd, to f/u any worsening symptoms or concerns ?

## 2023-04-21 NOTE — Assessment & Plan Note (Signed)
 Doing well currently, ok for reduced protonix to 40 qd

## 2023-04-21 NOTE — Assessment & Plan Note (Signed)
 Lab Results  Component Value Date   TSH 2.93 04/04/2023   Stable, pt to continue levothyroxine 75 mcg qd

## 2023-04-21 NOTE — Assessment & Plan Note (Signed)
 BP Readings from Last 3 Encounters:  04/18/23 128/82  11/23/22 120/80  10/08/22 126/78   Stable, pt to continue medical treatment norvasc 10 every day, coreg 3.125 bid, hydralazine 100 tid,

## 2023-04-21 NOTE — Assessment & Plan Note (Signed)
Overall stable, cont current med tx 

## 2023-04-21 NOTE — Assessment & Plan Note (Addendum)
 Age and sex appropriate education and counseling updated with regular exercise and diet Referrals for preventative services - for eye exam sept 2025, for dxa and mammogram with GYN in april Immunizations addressed - for flu shot today Smoking counseling  - none needed Evidence for depression or other mood disorder - chronic anxiety stable overall Most recent labs reviewed. I have personally reviewed and have noted: 1) the patient's medical and social history 2) The patient's current medications and supplements 3) The patient's height, weight, and BMI have been recorded in the chart

## 2023-04-21 NOTE — Assessment & Plan Note (Signed)
 Lab Results  Component Value Date   LDLCALC 56 04/04/2023   Stable, pt to continue current statin lovastatin 80 qhs

## 2023-04-21 NOTE — Assessment & Plan Note (Signed)
 Lab Results  Component Value Date   CREATININE 1.37 (H) 04/04/2023   Stable overall, cont to avoid nephrotoxins, f/u renal as planned

## 2023-04-21 NOTE — Assessment & Plan Note (Signed)
Mild to mod, for nystatin soln asd,  to f/u any worsening symptoms or concerns 

## 2023-04-21 NOTE — Assessment & Plan Note (Signed)
 Lab Results  Component Value Date   VITAMINB12 >1537 (H) 04/04/2023   Overcontrolled, cont oral replacement - b12 1000 mcg at reduced to 3 times per wk

## 2023-04-22 ENCOUNTER — Telehealth: Payer: Self-pay | Admitting: Internal Medicine

## 2023-04-22 ENCOUNTER — Ambulatory Visit (INDEPENDENT_AMBULATORY_CARE_PROVIDER_SITE_OTHER)

## 2023-04-22 DIAGNOSIS — Z23 Encounter for immunization: Secondary | ICD-10-CM

## 2023-04-22 NOTE — Progress Notes (Signed)
 Patient visits today to receive her [place] flu injection/vaccine. Patient was informed and tolerated well. Patient was notified to reach out to [place] if needed.

## 2023-04-22 NOTE — Telephone Encounter (Signed)
 Pt dropped off patient assistance problem forms for her pcp to sign and look over. Forms are placed in providers mailbox up front.  Please advise, Thanks

## 2023-04-23 NOTE — Telephone Encounter (Signed)
 Placed on provider desk

## 2023-04-24 NOTE — Telephone Encounter (Signed)
 Forms have been faxed today.

## 2023-04-27 ENCOUNTER — Other Ambulatory Visit: Payer: Self-pay | Admitting: Internal Medicine

## 2023-04-29 ENCOUNTER — Other Ambulatory Visit: Payer: Self-pay

## 2023-04-30 ENCOUNTER — Encounter: Payer: Self-pay | Admitting: Internal Medicine

## 2023-05-01 ENCOUNTER — Other Ambulatory Visit: Payer: Self-pay

## 2023-05-01 DIAGNOSIS — I6523 Occlusion and stenosis of bilateral carotid arteries: Secondary | ICD-10-CM

## 2023-05-06 ENCOUNTER — Ambulatory Visit: Payer: Self-pay | Admitting: Internal Medicine

## 2023-05-06 ENCOUNTER — Encounter: Payer: Self-pay | Admitting: Family Medicine

## 2023-05-06 ENCOUNTER — Telehealth (INDEPENDENT_AMBULATORY_CARE_PROVIDER_SITE_OTHER): Admitting: Family Medicine

## 2023-05-06 VITALS — Ht 62.0 in | Wt 155.0 lb

## 2023-05-06 DIAGNOSIS — J019 Acute sinusitis, unspecified: Secondary | ICD-10-CM | POA: Diagnosis not present

## 2023-05-06 MED ORDER — AMOXICILLIN-POT CLAVULANATE 875-125 MG PO TABS: 1.0000 | ORAL_TABLET | Freq: Two times a day (BID) | ORAL | 0 refills | Status: AC

## 2023-05-06 NOTE — Telephone Encounter (Signed)
  Chief Complaint: sinus congestion Symptoms: sore throat, yellow to green mucus, cough, runny nose, nasal congestion Frequency: x 5 days Pertinent Negatives: Patient denies earaches, SOB, chest pain, fever Disposition: [] ED /[] Urgent Care (no appt availability in office) / [x] Appointment(In office/virtual)/ []  [facility]/ [] Home Care/ [] Refused Recommended Disposition /[]  Mobile Bus/ []  Follow-up with PCP Additional Notes: Patient states her symptoms started last Wednesday night with sore throat. She has been taking Muccinex sinus since Thursday and has done 2 nasal saline washes. She states last week she spent time outside working in the yard and cleaning out buildings, exposure to dust and pollen. She states she usually gets a sinus infection around this time of year. Patient requesting antibiotics, advised acute visit. Offered office visits, she states due to being sick she would prefer virtual.  Copied from CRM [id number]. Topic: Clinical - Medical Advice >> May 06, 2023  9:10 AM Elizebeth Brooking wrote: Reason for CRM: Patient called in wanting a nurse to give her a call, patient stated that she thinks she is getting a sinus infection, and would like for someone to give her a callback to see if they can send in a antibiotic to the pharmacy Reason for Disposition  [1] Using nasal washes and pain medicine > 24 hours AND [2] sinus pain (around cheekbone or eye) persists  Answer Assessment - Initial Assessment Questions 1. LOCATION: "Where does it hurt?"      Denies any pain. States last week she had a headache.  2. ONSET: "When did the sinus pain start?"  (e.g., hours, days)      Thursday.  3. SEVERITY: "How bad is the pain?"   (Scale 1-10; mild, moderate or severe)   - MILD (1-3): doesn't interfere with normal activities    - MODERATE (4-7): interferes with normal activities (e.g., work or school) or awakens from sleep   - SEVERE (8-10): excruciating pain and patient unable to  do any normal activities        Denies any at this time.  4. RECURRENT SYMPTOM: "Have you ever had sinus problems before?" If Yes, ask: "When was the last time?" and "What happened that time?"      Yes, she states she gets one every year this time.  5. NASAL CONGESTION: "Is the nose blocked?" If Yes, ask: "Can you open it or must you breathe through your mouth?"     At night time when lying down.  6. NASAL DISCHARGE: "Do you have discharge from your nose?" If so ask, "What color?"     Yes, green to yellow.  7. FEVER: "Do you have a fever?" If Yes, ask: "What is it, how was it measured, and when did it start?"      Denies.  8. OTHER SYMPTOMS: "Do you have any other symptoms?" (e.g., sore throat, cough, earache, difficulty breathing)     Coughing up yellow mucus.  9. PREGNANCY: "Is there any chance you are pregnant?" "When was your last menstrual period?"     N/A.  Protocols used: Sinus Pain or Congestion-A-AH

## 2023-05-06 NOTE — Progress Notes (Signed)
 Virtual Visit via Video Note  I connected with Heather Dawson on 05/06/23 at 10:00 AM EDT by a video enabled telemedicine application and verified that I am speaking with the correct person using two identifiers.  Patient Location: Other:  in her vehicle in [place], [place] Provider Location: Office/Clinic  I discussed the limitations, risks, security, and privacy concerns of performing an evaluation and management service by video and the availability of in person appointments. I also discussed with the patient that there may be a patient responsible charge related to this service. The patient expressed understanding and agreed to proceed.  Subjective: PCP: Corwin Levins, MD  Chief Complaint  Patient presents with   Cough    Cough, congestion- yellow/green, nasal congestion, no fever- started Thursday  No at home test done    Patient complains of cough, head congestion, headache, and sore throat. She denies fever, shortness of breath, and wheezing. Onset of symptoms was 5 days ago, unchanged since that time. She is drinking plenty of fluids. Evaluation to date: none. Treatment to date:  Mucinex and saline nasal washes . She does not smoke.    ROS: Per HPI  Current Outpatient Medications:    acetaminophen (TYLENOL) 500 MG tablet, Take 1,000 mg by mouth every 8 (eight) hours as needed (pain)., Disp: , Rfl:    amLODipine (NORVASC) 10 MG tablet, TAKE ONE TABLET BY MOUTH DAILY, Disp: 90 tablet, Rfl: 3   Ascorbic Acid (VITAMIN C) 1000 MG tablet, Take 1,000 mg by mouth daily., Disp: , Rfl:    aspirin EC 81 MG tablet, Take 81 mg by mouth daily., Disp: , Rfl:    carvedilol (COREG) 3.125 MG tablet, Take 3.125 mg by mouth 2 (two) times daily., Disp: , Rfl:    Cholecalciferol (VITAMIN D3) 1000 units CAPS, Take 2 capsules (2,000 Units total) by mouth daily., Disp: 60 capsule, Rfl: 0   clobetasol (TEMOVATE) 0.05 % external solution, Apply 1 application topically daily as needed (scalp psoriasis). ,  Disp: , Rfl: 0   Continuous Glucose Sensor (DEXCOM G7 SENSOR) MISC, , Disp: , Rfl:    estradiol (ESTRACE) 0.1 MG/GM vaginal cream, Place 1 Applicatorful vaginally See admin instructions. Apply to vagina twice weekly on Monday, Thursday., Disp: , Rfl:    famotidine (PEPCID) 20 MG tablet, TAKE 1 TABLET BY MOUTH TWICE A DAY, Disp: 180 tablet, Rfl: 3   Fluocinolone Acetonide Body 0.01 % OIL, Apply topically., Disp: , Rfl:    hydrALAZINE (APRESOLINE) 100 MG tablet, TAKE ONE TABLET BY MOUTH THREE TIMES A DAY, Disp: 90 tablet, Rfl: 11   levocetirizine (XYZAL) 5 MG tablet, Take 5 mg by mouth every evening., Disp: , Rfl:    levothyroxine (SYNTHROID) 75 MCG tablet, TAKE ONE TABLET BY MOUTH DAILY, Disp: 90 tablet, Rfl: 3   lovastatin (MEVACOR) 40 MG tablet, TAKE TWO TABLETS BY MOUTH AT BEDTIME, Disp: 180 tablet, Rfl: 2   pantoprazole (PROTONIX) 40 MG tablet, Take 1 tablet (40 mg total) by mouth daily., Disp: 90 tablet, Rfl: 3   triamcinolone cream (KENALOG) 0.1 %, Apply 1 application  topically daily as needed (psoriasis)., Disp: , Rfl:    TURMERIC CURCUMIN PO, Take 1 tablet by mouth daily., Disp: , Rfl:    chlorpheniramine-HYDROcodone (TUSSIONEX) 10-8 MG/5ML, Take 5 mLs by mouth every 12 (twelve) hours as needed for cough. (Patient not taking: Reported on 05/06/2023), Disp: 115 mL, Rfl: 0   ondansetron (ZOFRAN-ODT) 4 MG disintegrating tablet, Take 1 tablet (4 mg total) by  mouth every 8 (eight) hours as needed for nausea or vomiting. (Patient not taking: Reported on 05/06/2023), Disp: 30 tablet, Rfl: 1   Semaglutide, 1 MG/DOSE, 4 MG/3ML SOPN, Inject 1 mg as directed once a week. (Patient not taking: Reported on 05/06/2023), Disp: 3 mL, Rfl: 11  Observations/Objective: Today's Vitals   05/06/23 0936  Weight: 155 lb (70.3 kg)  Height: 5\' 2"  (1.575 m)   Physical Exam Constitutional:      General: She is not in acute distress.    Appearance: Normal appearance. She is not ill-appearing or toxic-appearing.   Eyes:     General: No scleral icterus.       Right eye: No discharge.        Left eye: No discharge.     Conjunctiva/sclera: Conjunctivae normal.  Pulmonary:     Effort: Pulmonary effort is normal. No respiratory distress.  Neurological:     Mental Status: She is alert and oriented to person, place, and time.  Psychiatric:        Mood and Affect: Mood normal.        Behavior: Behavior normal.        Thought Content: Thought content normal.        Judgment: Judgment normal.    Assessment and Plan: 1. Acute non-recurrent sinusitis, unspecified location (Primary) Education provided on sinus infections. Encouraged symptom management including throat lozenges, chloraseptic spray, warm salt water gargles, hot tea/honey, cough syrup (Delsym), Tylenol/Ibuprofen, Vicks, and a humidifier at night.  - amoxicillin-clavulanate (AUGMENTIN) 875-125 MG tablet; Take 1 tablet by mouth 2 (two) times daily for 7 days.  Dispense: 14 tablet; Refill: 0   Follow Up Instructions: Return if symptoms worsen or fail to improve.   I discussed the assessment and treatment plan with the patient. The patient was provided an opportunity to ask questions, and all were answered. The patient agreed with the plan and demonstrated an understanding of the instructions.   The patient was advised to call back or seek an in-person evaluation if the symptoms worsen or if the condition fails to improve as anticipated.  The above assessment and management plan was discussed with the patient. The patient verbalized understanding of and has agreed to the management plan.   Gwenlyn Fudge, FNP

## 2023-05-06 NOTE — Patient Instructions (Signed)
 Throat lozenges, chloraseptic spray, warm salt water gargles, hot tea/honey, cough syrup (Delsym), Tylenol Cold & Flu, Vicks, and a humidifier at night.

## 2023-05-10 ENCOUNTER — Ambulatory Visit (HOSPITAL_COMMUNITY)
Admission: RE | Admit: 2023-05-10 | Discharge: 2023-05-10 | Disposition: A | Discharge status: Home / Self Care | Payer: Medicare Other | Source: Ambulatory Visit | Attending: Vascular Surgery | Admitting: Vascular Surgery

## 2023-05-10 ENCOUNTER — Ambulatory Visit: Payer: Medicare Other | Admitting: Physician Assistant

## 2023-05-10 VITALS — BP 156/82 | HR 54 | Temp 98.0°F | Resp 18 | Ht 62.0 in | Wt 157.9 lb

## 2023-05-10 DIAGNOSIS — I6523 Occlusion and stenosis of bilateral carotid arteries: Secondary | ICD-10-CM | POA: Diagnosis not present

## 2023-05-10 NOTE — Progress Notes (Signed)
 Office Note   History of Present Illness   Heather Dawson is a 68 y.o. (08/01/1955) female who presents for surveillance of carotid artery stenosis.  She has a history of left carotid endarterectomy by Dr. Darrick Penna on 08/25/2014.  This was done for high-grade asymptomatic left ICA stenosis.  The patient returns today for follow up.  She denies any recent CVA or TIA diagnosis.  She denies any strokelike symptoms such as slurred speech, facial droop, amaurosis fugax, or sudden weakness/numbness.  Current Outpatient Medications  Medication Sig Dispense Refill   acetaminophen (TYLENOL) 500 MG tablet Take 1,000 mg by mouth every 8 (eight) hours as needed (pain).     amLODipine (NORVASC) 10 MG tablet TAKE ONE TABLET BY MOUTH DAILY 90 tablet 3   amoxicillin-clavulanate (AUGMENTIN) 875-125 MG tablet Take 1 tablet by mouth 2 (two) times daily for 7 days. 14 tablet 0   Ascorbic Acid (VITAMIN C) 1000 MG tablet Take 1,000 mg by mouth daily.     aspirin EC 81 MG tablet Take 81 mg by mouth daily.     carvedilol (COREG) 3.125 MG tablet Take 3.125 mg by mouth 2 (two) times daily.     Cholecalciferol (VITAMIN D3) 1000 units CAPS Take 2 capsules (2,000 Units total) by mouth daily. 60 capsule 0   clobetasol (TEMOVATE) 0.05 % external solution Apply 1 application topically daily as needed (scalp psoriasis).   0   Continuous Glucose Sensor (DEXCOM G7 SENSOR) MISC      estradiol (ESTRACE) 0.1 MG/GM vaginal cream Place 1 Applicatorful vaginally See admin instructions. Apply to vagina twice weekly on Monday, Thursday.     famotidine (PEPCID) 20 MG tablet TAKE 1 TABLET BY MOUTH TWICE A DAY 180 tablet 3   Fluocinolone Acetonide Body 0.01 % OIL Apply topically.     hydrALAZINE (APRESOLINE) 100 MG tablet TAKE ONE TABLET BY MOUTH THREE TIMES A DAY 90 tablet 11   levocetirizine (XYZAL) 5 MG tablet Take 5 mg by mouth every evening.     levothyroxine (SYNTHROID) 75 MCG tablet TAKE ONE TABLET BY MOUTH DAILY 90 tablet 3    lovastatin (MEVACOR) 40 MG tablet TAKE TWO TABLETS BY MOUTH AT BEDTIME 180 tablet 2   pantoprazole (PROTONIX) 40 MG tablet Take 1 tablet (40 mg total) by mouth daily. 90 tablet 3   triamcinolone cream (KENALOG) 0.1 % Apply 1 application  topically daily as needed (psoriasis).     TURMERIC CURCUMIN PO Take 1 tablet by mouth daily.     chlorpheniramine-HYDROcodone (TUSSIONEX) 10-8 MG/5ML Take 5 mLs by mouth every 12 (twelve) hours as needed for cough. (Patient not taking: Reported on 05/10/2023) 115 mL 0   ondansetron (ZOFRAN-ODT) 4 MG disintegrating tablet Take 1 tablet (4 mg total) by mouth every 8 (eight) hours as needed for nausea or vomiting. (Patient not taking: Reported on 05/10/2023) 30 tablet 1   Semaglutide, 1 MG/DOSE, 4 MG/3ML SOPN Inject 1 mg as directed once a week. (Patient not taking: Reported on 05/10/2023) 3 mL 11   No current facility-administered medications for this visit.    REVIEW OF SYSTEMS (negative unless checked):   Cardiac:  []  Chest pain or chest pressure? []  Shortness of breath upon activity? []  Shortness of breath when lying flat? []  Irregular heart rhythm?  Vascular:  []  Pain in calf, thigh, or hip brought on by walking? []  Pain in feet at night that wakes you up from your sleep? []  Blood clot in your veins? []  Leg swelling?  Pulmonary:  []  Oxygen at home? []  Productive cough? []  Wheezing?  Neurologic:  []  Sudden weakness in arms or legs? []  Sudden numbness in arms or legs? []  Sudden onset of difficult speaking or slurred speech? []  Temporary loss of vision in one eye? []  Problems with dizziness?  Gastrointestinal:  []  Blood in stool? []  Vomited blood?  Genitourinary:  []  Burning when urinating? []  Blood in urine?  Psychiatric:  []  Major depression  Hematologic:  []  Bleeding problems? []  Problems with blood clotting?  Dermatologic:  []  Rashes or ulcers?  Constitutional:  []  Fever or chills?  Ear/Nose/Throat:  []  Change in  hearing? []  Nose bleeds? []  Sore throat?  Musculoskeletal:  []  Back pain? []  Joint pain? []  Muscle pain?   Physical Examination   Vitals:   05/10/23 1314  BP: (!) 156/82  Pulse: (!) 54  Resp: 18  Temp: 98 F (36.7 C)  TempSrc: Temporal  SpO2: 97%  Weight: 157 lb 14.4 oz (71.6 kg)  Height: 5\' 2"  (1.575 m)   Body mass index is 28.88 kg/m.  General:  WDWN in NAD; vital signs documented above Gait: Not observed HENT: WNL, normocephalic Pulmonary: normal non-labored breathing , without rales, rhonchi,  wheezing Cardiac: regular Abdomen: soft, NT, no masses Skin: without rashes Vascular Exam/Pulses: palpable radial pulses bilaterally Extremities: without ischemic changes, without gangrene , without cellulitis; without open wounds;  Musculoskeletal: no muscle wasting or atrophy  Neurologic: A&O X 3;  No focal weakness or paresthesias are detected Psychiatric:  The pt has Normal affect.  Non-Invasive Vascular Imaging   Bilateral Carotid Duplex (05/10/2023):  R ICA stenosis:  1-39% R VA:  patent and antegrade L ICA stenosis:  1-39% L VA:  patent and antegrade   Medical Decision Making   Heather Dawson is a 68 y.o. female who presents for surveillance of carotid artery stenosis  Based on the patient's vascular studies, her carotid artery stenosis is unchanged at 1 to 39% bilaterally She denies any strokelike symptoms such as slurred speech, facial droop, sudden visual changes, or sudden weakness/numbness. She has no carotid bruit on exam. She has palpable and equal radial pulses bilaterally She can follow-up with our office in 1 year with repeat carotid duplex   Loel Dubonnet PA-C [facility] Office: [phone]  Call MD: Randie Heinz

## 2023-05-14 NOTE — Telephone Encounter (Signed)
 Copied from CRM [id number]. Topic: General - Other >> May 14, 2023 11:11 AM Almira Coaster wrote: Reason for CRM: Patient was calling to speak with with [facility] regarding [employer] forms. They informed patient that forms were received but were missing page four Novo part E & F. She would like to know if it can be re-faxed to the company and call her to advise once it's been sent out so she can follow up with them.

## 2023-05-16 DIAGNOSIS — L400 Psoriasis vulgaris: Secondary | ICD-10-CM | POA: Diagnosis not present

## 2023-05-16 DIAGNOSIS — K080 Exfoliation of teeth due to systemic causes: Secondary | ICD-10-CM | POA: Diagnosis not present

## 2023-05-24 ENCOUNTER — Telehealth: Payer: Self-pay

## 2023-05-24 NOTE — Telephone Encounter (Signed)
 Called and let Pt know Ozempic has arrived in the office and is ready for pick up, it has been placed in nurse station fridge.

## 2023-05-30 DIAGNOSIS — E1122 Type 2 diabetes mellitus with diabetic chronic kidney disease: Secondary | ICD-10-CM | POA: Diagnosis not present

## 2023-05-30 DIAGNOSIS — K219 Gastro-esophageal reflux disease without esophagitis: Secondary | ICD-10-CM | POA: Diagnosis not present

## 2023-05-30 DIAGNOSIS — I10 Essential (primary) hypertension: Secondary | ICD-10-CM | POA: Diagnosis not present

## 2023-05-30 DIAGNOSIS — E039 Hypothyroidism, unspecified: Secondary | ICD-10-CM | POA: Diagnosis not present

## 2023-05-30 DIAGNOSIS — N183 Chronic kidney disease, stage 3 unspecified: Secondary | ICD-10-CM | POA: Diagnosis not present

## 2023-05-30 DIAGNOSIS — I129 Hypertensive chronic kidney disease with stage 1 through stage 4 chronic kidney disease, or unspecified chronic kidney disease: Secondary | ICD-10-CM | POA: Diagnosis not present

## 2023-06-12 ENCOUNTER — Other Ambulatory Visit: Payer: Self-pay | Admitting: Internal Medicine

## 2023-06-12 ENCOUNTER — Other Ambulatory Visit: Payer: Self-pay

## 2023-06-17 DIAGNOSIS — Z01419 Encounter for gynecological examination (general) (routine) without abnormal findings: Secondary | ICD-10-CM | POA: Diagnosis not present

## 2023-06-17 DIAGNOSIS — Z6828 Body mass index (BMI) 28.0-28.9, adult: Secondary | ICD-10-CM | POA: Diagnosis not present

## 2023-06-17 DIAGNOSIS — M8588 Other specified disorders of bone density and structure, other site: Secondary | ICD-10-CM | POA: Diagnosis not present

## 2023-06-17 DIAGNOSIS — Z1231 Encounter for screening mammogram for malignant neoplasm of breast: Secondary | ICD-10-CM | POA: Diagnosis not present

## 2023-06-17 LAB — HM DEXA SCAN

## 2023-06-23 ENCOUNTER — Other Ambulatory Visit: Payer: Self-pay | Admitting: Internal Medicine

## 2023-06-24 ENCOUNTER — Other Ambulatory Visit: Payer: Self-pay

## 2023-06-28 DIAGNOSIS — R350 Frequency of micturition: Secondary | ICD-10-CM | POA: Diagnosis not present

## 2023-06-28 DIAGNOSIS — R338 Other retention of urine: Secondary | ICD-10-CM | POA: Diagnosis not present

## 2023-06-28 DIAGNOSIS — N3946 Mixed incontinence: Secondary | ICD-10-CM | POA: Diagnosis not present

## 2023-06-28 DIAGNOSIS — N281 Cyst of kidney, acquired: Secondary | ICD-10-CM | POA: Diagnosis not present

## 2023-07-16 ENCOUNTER — Encounter: Payer: Self-pay | Admitting: Internal Medicine

## 2023-07-24 ENCOUNTER — Other Ambulatory Visit: Payer: Self-pay | Admitting: Internal Medicine

## 2023-07-24 ENCOUNTER — Other Ambulatory Visit: Payer: Self-pay

## 2023-07-26 ENCOUNTER — Other Ambulatory Visit (HOSPITAL_COMMUNITY): Payer: Self-pay

## 2023-07-26 ENCOUNTER — Telehealth: Payer: Self-pay

## 2023-07-26 NOTE — Telephone Encounter (Signed)
 Pharmacy Patient Advocate Encounter   Received notification from CoverMyMeds that prior authorization for Ozempic  2 is required/requested.   Insurance verification completed.   The patient is insured through [facility] .   Per test claim: patient has plan benefit exclusion. Through Medicare, the patient is enrolled in an assistance program .

## 2023-08-11 ENCOUNTER — Encounter: Payer: Self-pay | Admitting: Internal Medicine

## 2023-08-15 IMAGING — DX DG CHEST 2V
2 series · 2 of 2 positions shown · non-contrast
Comparison: Chest x-ray 09/29/2019.

CLINICAL DATA: New onset cough.

EXAM:
CHEST - 2 VIEW

[chest pa]
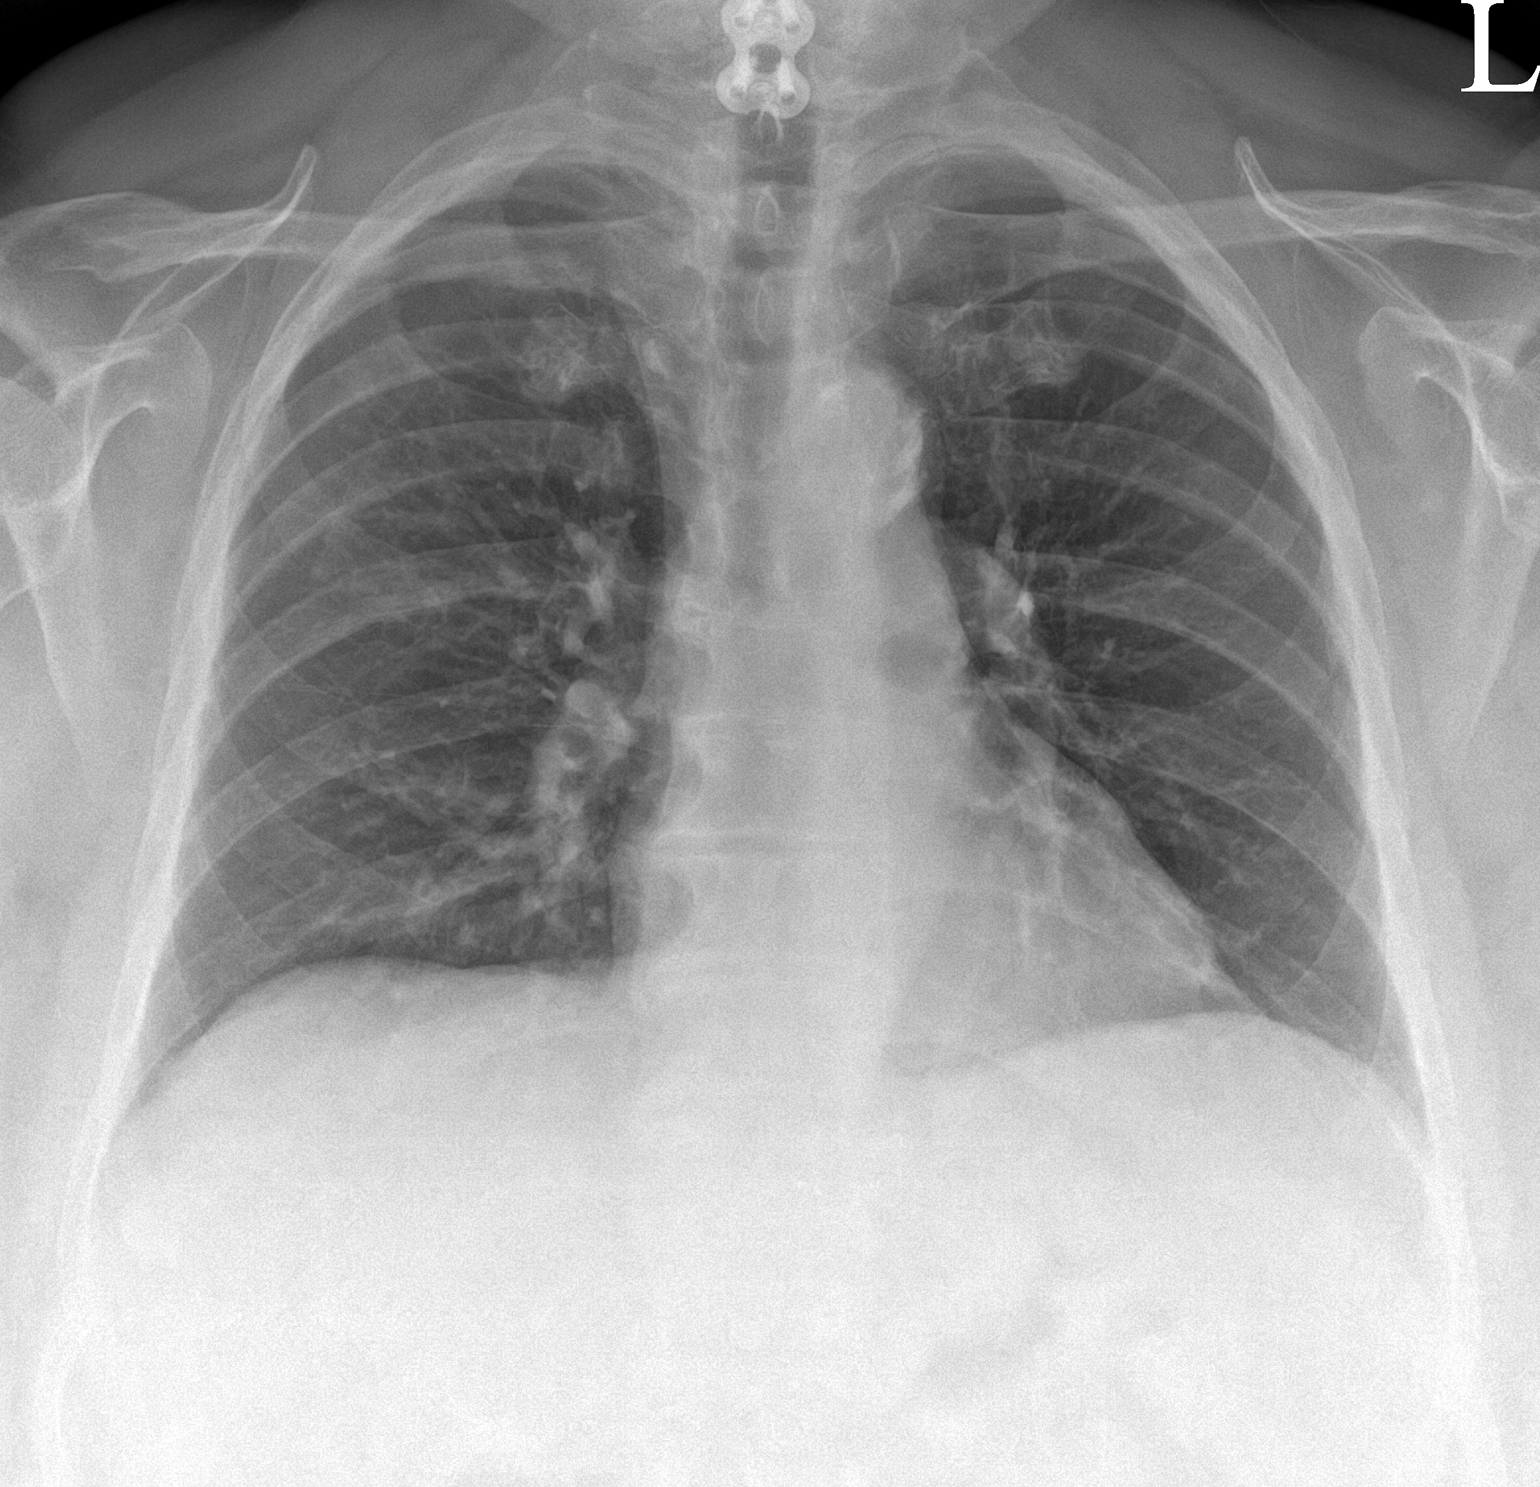

[chest lat]
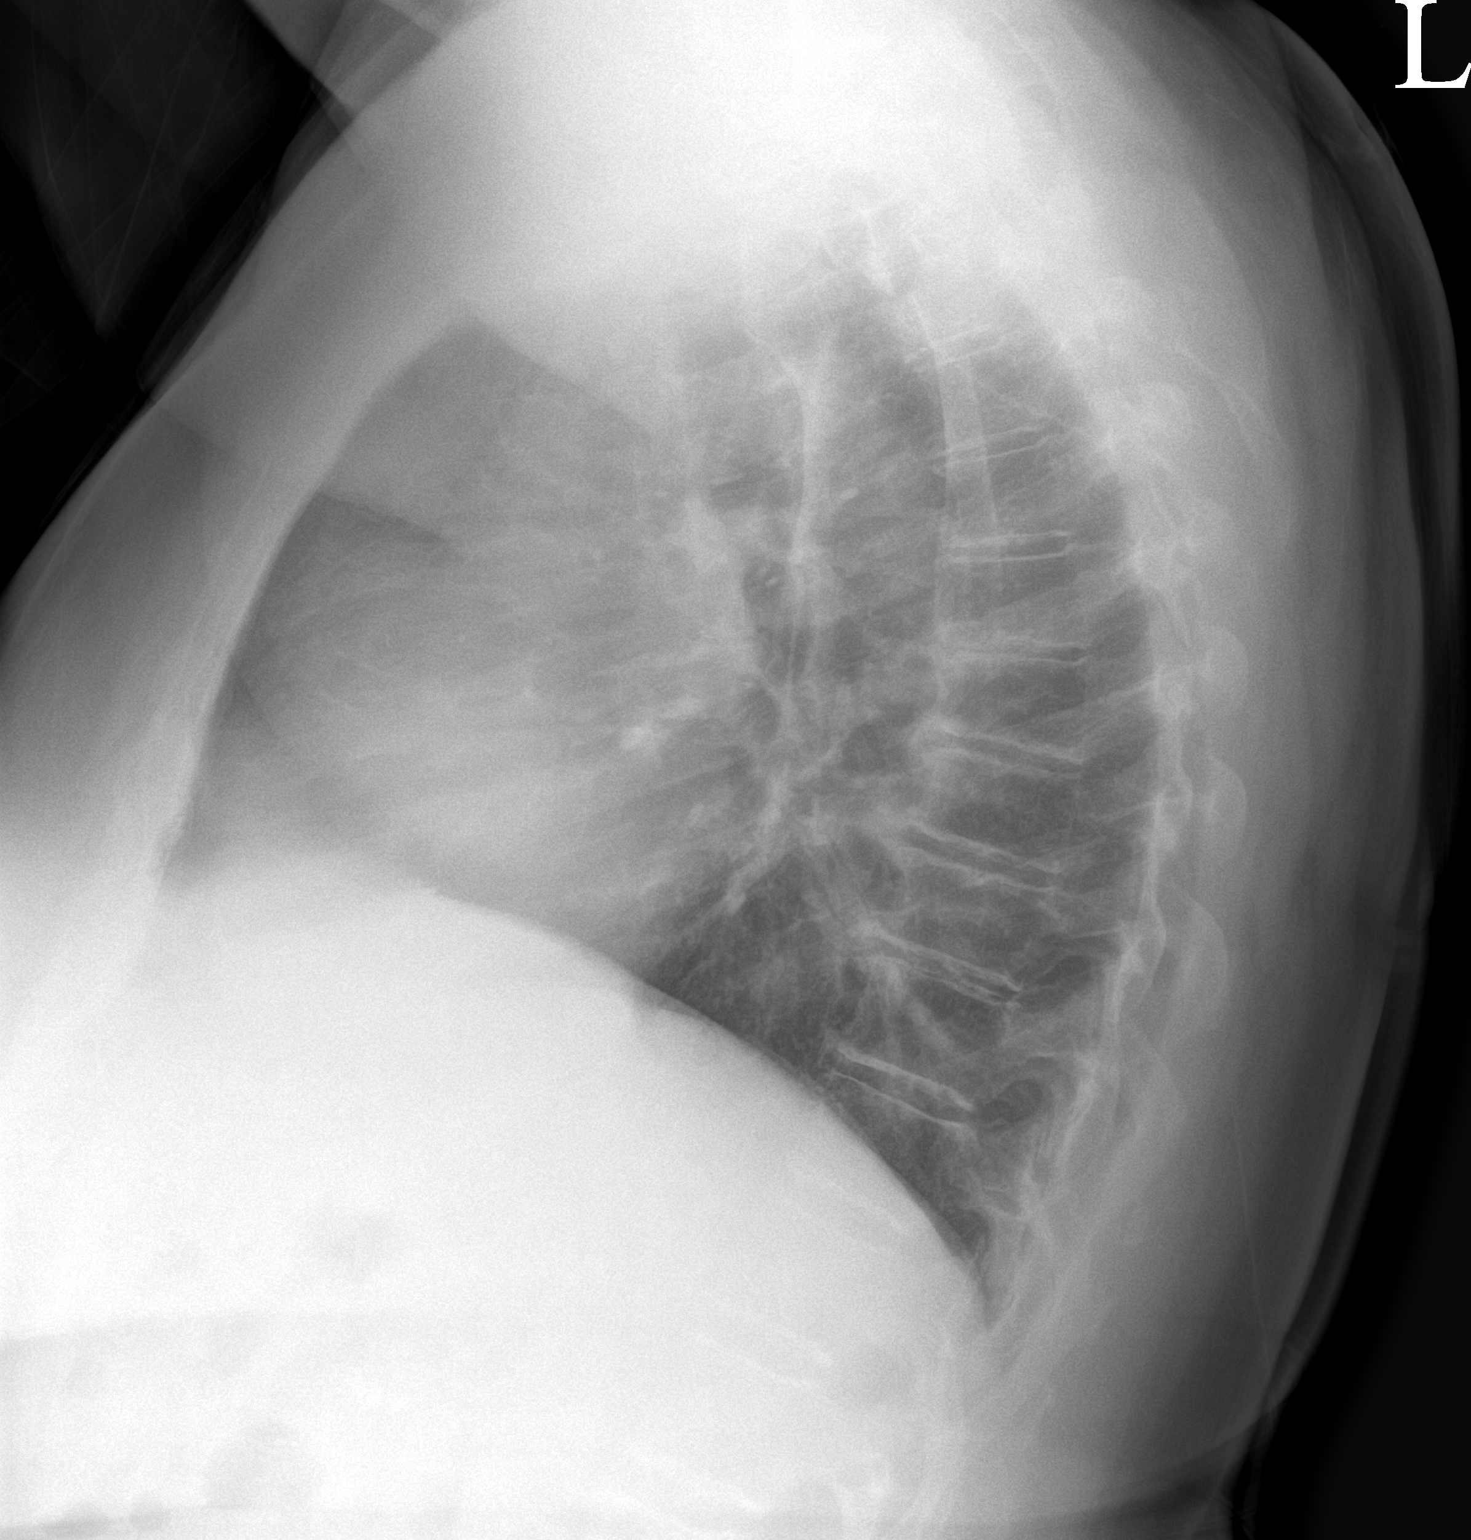

[2 of 2 positions shown; findings below may reference images not displayed]

FINDINGS: The heart size and mediastinal contours are within normal limits.
Both lungs are clear. Cervical spinal fusion plate is present. No
acute fractures are seen.
IMPRESSION: No active cardiopulmonary disease.

## 2023-08-27 DIAGNOSIS — L720 Epidermal cyst: Secondary | ICD-10-CM | POA: Diagnosis not present

## 2023-08-27 DIAGNOSIS — D485 Neoplasm of uncertain behavior of skin: Secondary | ICD-10-CM | POA: Diagnosis not present

## 2023-09-02 ENCOUNTER — Telehealth: Payer: Self-pay

## 2023-09-02 NOTE — Telephone Encounter (Signed)
 Patient assistance based Ozempic  1 mg is in and ready for pick up. Patient has been notified via MyChart.

## 2023-09-09 ENCOUNTER — Ambulatory Visit: Payer: Medicare Other | Admitting: Internal Medicine

## 2023-09-18 ENCOUNTER — Encounter (HOSPITAL_BASED_OUTPATIENT_CLINIC_OR_DEPARTMENT_OTHER): Payer: Self-pay

## 2023-09-18 ENCOUNTER — Ambulatory Visit: Payer: Self-pay

## 2023-09-18 ENCOUNTER — Encounter: Payer: Self-pay | Admitting: Internal Medicine

## 2023-09-18 ENCOUNTER — Emergency Department (HOSPITAL_BASED_OUTPATIENT_CLINIC_OR_DEPARTMENT_OTHER): Admission: EM | Admit: 2023-09-18 | Discharge: 2023-09-18 | Disposition: A | Discharge status: Home / Self Care

## 2023-09-18 ENCOUNTER — Other Ambulatory Visit: Payer: Self-pay

## 2023-09-18 DIAGNOSIS — Z7982 Long term (current) use of aspirin: Secondary | ICD-10-CM | POA: Diagnosis not present

## 2023-09-18 DIAGNOSIS — R197 Diarrhea, unspecified: Secondary | ICD-10-CM | POA: Diagnosis not present

## 2023-09-18 LAB — COMPREHENSIVE METABOLIC PANEL WITH GFR
ALT: 11 U/L (ref 0–44)
AST: 17 U/L (ref 15–41)
Albumin: 4.1 g/dL (ref 3.5–5.0)
Alkaline Phosphatase: 60 U/L (ref 38–126)
Anion gap: 14 (ref 5–15)
BUN: 22 mg/dL (ref 8–23)
CO2: 23 mmol/L (ref 22–32)
Calcium: 9.2 mg/dL (ref 8.9–10.3)
Chloride: 105 mmol/L (ref 98–111)
Creatinine, Ser: 1.45 mg/dL — ABNORMAL HIGH (ref 0.44–1.00)
GFR, Estimated: 40 mL/min — ABNORMAL LOW (ref >60)
Glucose, Bld: 127 mg/dL — ABNORMAL HIGH (ref 70–99)
Potassium: 3.9 mmol/L (ref 3.5–5.1)
Sodium: 141 mmol/L (ref 135–145)
Total Bilirubin: 0.5 mg/dL (ref 0.0–1.2)
Total Protein: 6.4 g/dL — ABNORMAL LOW (ref 6.5–8.1)

## 2023-09-18 LAB — CBC
HCT: 38.6 % (ref 36.0–46.0)
Hemoglobin: 13.5 g/dL (ref 12.0–15.0)
MCH: 32.5 pg (ref 26.0–34.0)
MCHC: 35 g/dL (ref 30.0–36.0)
MCV: 92.8 fL (ref 80.0–100.0)
Platelets: 281 K/uL (ref 150–400)
RBC: 4.16 MIL/uL (ref 3.87–5.11)
RDW: 11.9 % (ref 11.5–15.5)
WBC: 6.9 K/uL (ref 4.0–10.5)
nRBC: 0 % (ref 0.0–0.2)

## 2023-09-18 LAB — LIPASE, BLOOD: Lipase: 47 U/L (ref 11–51)

## 2023-09-18 MED ORDER — DICYCLOMINE HCL 20 MG PO TABS: 20.0000 mg | ORAL_TABLET | Freq: Two times a day (BID) | ORAL | 0 refills | Status: DC | PRN

## 2023-09-18 MED ORDER — DICYCLOMINE HCL 10 MG/ML IM SOLN
10.0000 mg | Freq: Once | INTRAMUSCULAR | Status: AC
  Administered 2023-09-18: 10 mg via INTRAMUSCULAR
  Filled 2023-09-18: qty 2

## 2023-09-18 NOTE — ED Provider Notes (Signed)
 [place] EMERGENCY DEPARTMENT AT [facility] Provider Note   CSN: [id number] Arrival date & time: 09/18/23  1325     Patient presents with: Diarrhea   Heather Dawson is a 68 y.o. female.   68 year old female presents for evaluation of diarrhea for 4 days. States she had some cramping Saturday night and the next day developed diarrhea. States it only happens when she eats. States she is on ozempic  but has been on it for a while and was concerned it could be contributing to her symptoms. She denies any vomiting, nausea, abdominal pain, chest pain or any other symptoms or concerns at this time.    Diarrhea Associated symptoms: no abdominal pain, no arthralgias, no chills, no fever and no vomiting        Prior to Admission medications   Medication Sig Start Date End Date Taking? Authorizing Provider  dicyclomine  (BENTYL ) 20 MG tablet Take 1 tablet (20 mg total) by mouth 2 (two) times daily as needed for spasms. 09/18/23  Yes Roberta Angell L, DO  acetaminophen  (TYLENOL ) 500 MG tablet Take 1,000 mg by mouth every 8 (eight) hours as needed (pain).    [provider]  amLODipine  (NORVASC ) 10 MG tablet TAKE 1 TABLET BY MOUTH DAILY 06/12/23   Norleen Lynwood ORN, MD  Ascorbic Acid (VITAMIN C) 1000 MG tablet Take 1,000 mg by mouth daily.    [provider]  aspirin  EC 81 MG tablet Take 81 mg by mouth daily.    [provider]  carvedilol (COREG) 3.125 MG tablet Take 3.125 mg by mouth 2 (two) times daily. 03/12/22   [provider]  chlorpheniramine-HYDROcodone  (TUSSIONEX) 10-8 MG/5ML Take 5 mLs by mouth every 12 (twelve) hours as needed for cough. Patient not taking: Reported on 05/10/2023 05/03/22   Norleen Lynwood ORN, MD  Cholecalciferol  (VITAMIN D3) 1000 units CAPS Take 2 capsules (2,000 Units total) by mouth daily. 04/27/15   Norleen Lynwood ORN, MD  clobetasol  (TEMOVATE ) 0.05 % external solution Apply 1 application topically daily as needed (scalp psoriasis).   12/07/15   [provider]  Continuous Glucose Sensor (DEXCOM G7 SENSOR) MISC  01/06/23   [provider]  estradiol (ESTRACE) 0.1 MG/GM vaginal cream Place 1 Applicatorful vaginally See admin instructions. Apply to vagina twice weekly on Monday, Thursday. 03/15/21   [provider]  famotidine  (PEPCID ) 20 MG tablet TAKE 1 TABLET BY MOUTH TWICE A DAY 11/26/22   Norleen Lynwood ORN, MD  Fluocinolone Acetonide Body 0.01 % OIL Apply topically. 03/20/23   [provider]  hydrALAZINE  (APRESOLINE ) 100 MG tablet TAKE 1 TABLET BY MOUTH 3 TIMES A DAY 06/24/23   Norleen Lynwood ORN, MD  levocetirizine (XYZAL) 5 MG tablet Take 5 mg by mouth every evening. 09/12/17   [provider]  levothyroxine  (SYNTHROID ) 75 MCG tablet TAKE 1 TABLET BY MOUTH DAILY 07/24/23   Norleen Lynwood ORN, MD  lovastatin  (MEVACOR ) 40 MG tablet TAKE TWO TABLETS BY MOUTH AT BEDTIME 04/29/23   Norleen Lynwood ORN, MD  ondansetron  (ZOFRAN -ODT) 4 MG disintegrating tablet Take 1 tablet (4 mg total) by mouth every 8 (eight) hours as needed for nausea or vomiting. Patient not taking: Reported on 05/10/2023 10/09/22   Norleen Lynwood ORN, MD  pantoprazole  (PROTONIX ) 40 MG tablet Take 1 tablet (40 mg total) by mouth daily. 04/18/23   Norleen Lynwood ORN, MD  Semaglutide , 1 MG/DOSE, 4 MG/3ML SOPN Inject 1 mg as directed once a week. Patient not taking: Reported  on 05/10/2023 10/08/22   Norleen Lynwood ORN, MD  triamcinolone  cream (KENALOG ) 0.1 % Apply 1 application  topically daily as needed (psoriasis). 06/03/19   [provider]  TURMERIC CURCUMIN PO Take 1 tablet by mouth daily.    [provider]    Allergies: Macrodantin  [nitrofurantoin  macrocrystal], Adipex-p  [phentermine ], Asa [aspirin ], Bactrim  [sulfamethoxazole -trimethoprim ], Cipro [ciprofloxacin hcl], Levaquin  [levofloxacin  in d5w], Lipitor [atorvastatin ], Lyrica [pregabalin], Nsaids, Vibramycin  [doxycycline ], Zinacef [cefuroxime], and Neurontin  [gabapentin ]    Review of  Systems  Constitutional:  Negative for chills and fever.  HENT:  Negative for ear pain and sore throat.   Eyes:  Negative for pain and visual disturbance.  Respiratory:  Negative for cough and shortness of breath.   Cardiovascular:  Negative for chest pain and palpitations.  Gastrointestinal:  Positive for diarrhea. Negative for abdominal pain and vomiting.  Genitourinary:  Negative for dysuria and hematuria.  Musculoskeletal:  Negative for arthralgias and back pain.  Skin:  Negative for color change and rash.  Neurological:  Negative for seizures and syncope.  All other systems reviewed and are negative.   Updated Vital Signs BP 137/72 (BP Location: Right Arm)   Pulse 64   Temp 98.4 F (36.9 C) (Oral)   Resp 16   SpO2 100%   Physical Exam Vitals and nursing note reviewed.  Constitutional:      General: She is not in acute distress.    Appearance: Normal appearance. She is well-developed. She is not ill-appearing.  HENT:     Head: Normocephalic and atraumatic.  Eyes:     Conjunctiva/sclera: Conjunctivae normal.  Cardiovascular:     Rate and Rhythm: Normal rate and regular rhythm.     Heart sounds: No murmur heard. Pulmonary:     Effort: Pulmonary effort is normal. No respiratory distress.     Breath sounds: Normal breath sounds.  Abdominal:     General: There is no distension.     Palpations: Abdomen is soft. There is no mass.     Tenderness: There is no abdominal tenderness.     Hernia: No hernia is present.  Musculoskeletal:        General: No swelling.     Cervical back: Neck supple.  Skin:    General: Skin is warm and dry.     Capillary Refill: Capillary refill takes less than 2 seconds.  Neurological:     Mental Status: She is alert.  Psychiatric:        Mood and Affect: Mood normal.     (all labs ordered are listed, but only abnormal results are displayed) Labs Reviewed  COMPREHENSIVE METABOLIC PANEL WITH GFR - Abnormal; Notable for the following  components:      Result Value   Glucose, Bld 127 (*)    Creatinine, Ser 1.45 (*)    Total Protein 6.4 (*)    GFR, Estimated 40 (*)    All other components within normal limits  LIPASE, BLOOD  CBC    EKG: None  Radiology: No results found.   Procedures   Medications Ordered in the ED  dicyclomine  (BENTYL ) injection 10 mg (10 mg Intramuscular Given 09/18/23 1519)                                    Medical Decision Making Patient here for diarrhea for a few days.  Lab workup reviewed and unremarkable.  Her vitals are stable she has moist  mucous membranes and no signs of dehydration based on lab work or physical exam.  I advised Imodium to use as needed for diarrhea and I will give her a shot of Bentyl  here has for her cramping abdominal pain.  Will give her prescription for Bentyl  and advised close follow-up with primary care as needed.  Advise otherwise return to the ER for any worsening symptoms.  Feels comfortable to plan to be discharged home and all results were discussed with patient and husband at bedside.  Problems Addressed: Diarrhea, unspecified type: acute illness or injury  Amount and/or Complexity of Data Reviewed External Data Reviewed: notes.    Details: Outpatient records reviewed and patient has been prescribed Ozempic  Labs: ordered. Decision-making details documented in ED Course.    Details: Ordered and reviewed by me and unremarkable  Risk OTC drugs. Prescription drug management.    Final diagnoses:  Diarrhea, unspecified type    ED Discharge Orders          Ordered    dicyclomine  (BENTYL ) 20 MG tablet  2 times daily PRN        09/18/23 1516               Santiago Graf L, DO 09/18/23 1747

## 2023-09-18 NOTE — ED Notes (Signed)
 DC paperwork given and verbally understood.

## 2023-09-18 NOTE — Telephone Encounter (Signed)
 FYI Only or Action Required?: FYI only for provider.  Patient was last seen in primary care on 05/06/2023 by Merlynn Niki FALCON, FNP.  Called Nurse Triage reporting Abdominal Cramping and Diarrhea.  Symptoms began several days ago.  Interventions attempted: Rest, hydration, or home remedies.  Symptoms are: rapidly worsening.  Triage Disposition: Go to ED Now (Notify PCP)  Patient/caregiver understands and will follow disposition?: Yes Reason for Disposition  [1] SEVERE abdominal pain AND [2] age > 60 years  Answer Assessment - Initial Assessment Questions Triaged to ED, states she will go to [facility] ED. States has had chronic stomach issues for months, but this current onset is more severe.   1. DIARRHEA SEVERITY: How bad is the diarrhea? How many more stools have you had in the past 24 hours than normal?      Constant, watery  2. ONSET: When did the diarrhea begin?      09/14/23 PM (last ozempic  injection 09/14/23 AM)  3. STOOL DESCRIPTION:  How loose or watery is the diarrhea? What is the stool color? Is there any blood or mucous in the stool?     Watery  4. VOMITING: Are you also vomiting? If Yes, ask: How many times in the past 24 hours?      Denies  5. ABDOMEN PAIN: Are you having any abdomen pain? If Yes, ask: What does it feel like? (e.g., crampy, dull, intermittent, constant)      Severe intermittent cramping after eating, unable to tolerate food  6. ABDOMEN PAIN SEVERITY: If present, ask: How bad is the pain?  (e.g., Scale 1-10; mild, moderate, or severe)     Severe cramping immediately after eating, improves after passing diarrhea  7. ORAL INTAKE: If vomiting, Have you been able to drink liquids? How much liquids have you had in the past 24 hours?     Only able to tolerate water  8. HYDRATION: Any signs of dehydration? (e.g., dry mouth [not just dry lips], too weak to stand, dizziness, new weight loss) When did you last urinate?      Denies, states she is still urinating  Protocols used: [facility] Copied from CRM [id number]. Topic: Clinical - Red Word Triage >> Sep 18, 2023 12:32 PM Burnard DEL wrote: Red Word that prompted transfer to Nurse Triage: severe stomach pain,cramping,diarrhea, not able to eat or keep anything down

## 2023-09-18 NOTE — ED Notes (Signed)
 Pt aware of the need for a urine... Pt currently unable to provide a sample.SABRASABRA

## 2023-09-18 NOTE — Discharge Instructions (Addendum)
 Call and follow up with your primary care doctor. Continue to drink lots of fluids and eat a balanced diet. Take bentyl  as needed for abdominal cramping. Take immodium as needed for diarrhea.

## 2023-09-18 NOTE — ED Notes (Signed)
Blood obtained in triage.

## 2023-09-18 NOTE — ED Triage Notes (Signed)
 Patient has been having diarrhea since Saturday night. She states it is all liquid at this point. She reports abdominal cramping with eating as well. She reports she is on Ozempic , and her provider said if she kept having symptoms she may need to stop it.

## 2023-09-24 DIAGNOSIS — B370 Candidal stomatitis: Secondary | ICD-10-CM | POA: Diagnosis not present

## 2023-10-02 DIAGNOSIS — L400 Psoriasis vulgaris: Secondary | ICD-10-CM | POA: Diagnosis not present

## 2023-10-02 DIAGNOSIS — L905 Scar conditions and fibrosis of skin: Secondary | ICD-10-CM | POA: Diagnosis not present

## 2023-10-02 DIAGNOSIS — Z85828 Personal history of other malignant neoplasm of skin: Secondary | ICD-10-CM | POA: Diagnosis not present

## 2023-10-15 DIAGNOSIS — N183 Chronic kidney disease, stage 3 unspecified: Secondary | ICD-10-CM | POA: Diagnosis not present

## 2023-10-15 DIAGNOSIS — E1122 Type 2 diabetes mellitus with diabetic chronic kidney disease: Secondary | ICD-10-CM | POA: Diagnosis not present

## 2023-10-15 DIAGNOSIS — E039 Hypothyroidism, unspecified: Secondary | ICD-10-CM | POA: Diagnosis not present

## 2023-10-15 DIAGNOSIS — I129 Hypertensive chronic kidney disease with stage 1 through stage 4 chronic kidney disease, or unspecified chronic kidney disease: Secondary | ICD-10-CM | POA: Diagnosis not present

## 2023-10-16 ENCOUNTER — Encounter: Payer: Self-pay | Admitting: Internal Medicine

## 2023-10-16 ENCOUNTER — Ambulatory Visit (INDEPENDENT_AMBULATORY_CARE_PROVIDER_SITE_OTHER): Payer: Medicare Other | Admitting: Internal Medicine

## 2023-10-16 ENCOUNTER — Ambulatory Visit: Payer: Self-pay | Admitting: Internal Medicine

## 2023-10-16 VITALS — BP 148/92 | HR 58 | Temp 98.0°F | Ht 62.0 in | Wt 149.2 lb

## 2023-10-16 DIAGNOSIS — E039 Hypothyroidism, unspecified: Secondary | ICD-10-CM | POA: Diagnosis not present

## 2023-10-16 DIAGNOSIS — E782 Mixed hyperlipidemia: Secondary | ICD-10-CM

## 2023-10-16 DIAGNOSIS — N184 Chronic kidney disease, stage 4 (severe): Secondary | ICD-10-CM | POA: Diagnosis not present

## 2023-10-16 DIAGNOSIS — E559 Vitamin D deficiency, unspecified: Secondary | ICD-10-CM | POA: Diagnosis not present

## 2023-10-16 DIAGNOSIS — E1169 Type 2 diabetes mellitus with other specified complication: Secondary | ICD-10-CM

## 2023-10-16 DIAGNOSIS — E538 Deficiency of other specified B group vitamins: Secondary | ICD-10-CM | POA: Diagnosis not present

## 2023-10-16 DIAGNOSIS — E669 Obesity, unspecified: Secondary | ICD-10-CM | POA: Diagnosis not present

## 2023-10-16 DIAGNOSIS — I10 Essential (primary) hypertension: Secondary | ICD-10-CM | POA: Diagnosis not present

## 2023-10-16 DIAGNOSIS — Z7985 Long-term (current) use of injectable non-insulin antidiabetic drugs: Secondary | ICD-10-CM

## 2023-10-16 LAB — BASIC METABOLIC PANEL WITH GFR
BUN: 21 mg/dL (ref 6–23)
CO2: 26 meq/L (ref 19–32)
Calcium: 9.6 mg/dL (ref 8.4–10.5)
Chloride: 104 meq/L (ref 96–112)
Creatinine, Ser: 1.39 mg/dL — ABNORMAL HIGH (ref 0.40–1.20)
GFR: 39.44 mL/min — ABNORMAL LOW (ref >60.00)
Glucose, Bld: 95 mg/dL (ref 70–99)
Potassium: 4.1 meq/L (ref 3.5–5.1)
Sodium: 141 meq/L (ref 135–145)

## 2023-10-16 LAB — CBC WITH DIFFERENTIAL/PLATELET
Basophils Absolute: 0 K/uL (ref 0.0–0.1)
Basophils Relative: 0.6 % (ref 0.0–3.0)
Eosinophils Absolute: 0.2 K/uL (ref 0.0–0.7)
Eosinophils Relative: 2.8 % (ref 0.0–5.0)
HCT: 43.4 % (ref 36.0–46.0)
Hemoglobin: 14.5 g/dL (ref 12.0–15.0)
Lymphocytes Relative: 24.5 % (ref 12.0–46.0)
Lymphs Abs: 1.5 K/uL (ref 0.7–4.0)
MCHC: 33.5 g/dL (ref 30.0–36.0)
MCV: 96.3 fl (ref 78.0–100.0)
Monocytes Absolute: 0.5 K/uL (ref 0.1–1.0)
Monocytes Relative: 7.4 % (ref 3.0–12.0)
Neutro Abs: 4.1 K/uL (ref 1.4–7.7)
Neutrophils Relative %: 64.7 % (ref 43.0–77.0)
Platelets: 256 K/uL (ref 150.0–400.0)
RBC: 4.51 Mil/uL (ref 3.87–5.11)
RDW: 12.4 % (ref 11.5–15.5)
WBC: 6.3 K/uL (ref 4.0–10.5)

## 2023-10-16 LAB — VITAMIN D 25 HYDROXY (VIT D DEFICIENCY, FRACTURES): VITD: 74.89 ng/mL (ref 30.00–100.00)

## 2023-10-16 LAB — HEPATIC FUNCTION PANEL
ALT: 14 U/L (ref 0–35)
AST: 16 U/L (ref 0–37)
Albumin: 4.4 g/dL (ref 3.5–5.2)
Alkaline Phosphatase: 47 U/L (ref 39–117)
Bilirubin, Direct: 0.2 mg/dL (ref 0.0–0.3)
Total Bilirubin: 0.7 mg/dL (ref 0.2–1.2)
Total Protein: 7 g/dL (ref 6.0–8.3)

## 2023-10-16 LAB — VITAMIN B12: Vitamin B-12: 1479 pg/mL — ABNORMAL HIGH (ref 211–911)

## 2023-10-16 LAB — LIPID PANEL
Cholesterol: 135 mg/dL (ref 0–200)
HDL: 40.3 mg/dL (ref >39.00)
LDL Cholesterol: 70 mg/dL (ref 0–99)
NonHDL: 94.47
Total CHOL/HDL Ratio: 3
Triglycerides: 123 mg/dL (ref 0.0–149.0)
VLDL: 24.6 mg/dL (ref 0.0–40.0)

## 2023-10-16 LAB — LAB REPORT - SCANNED
A1c: 5.5
EGFR: 39.4

## 2023-10-16 LAB — HEMOGLOBIN A1C: Hgb A1c MFr Bld: 5.5 % (ref 4.6–6.5)

## 2023-10-16 MED ORDER — SEMAGLUTIDE (2 MG/DOSE) 8 MG/3ML ~~LOC~~ SOPN: 2.0000 mg | PEN_INJECTOR | SUBCUTANEOUS | Status: DC

## 2023-10-16 NOTE — Progress Notes (Signed)
 Patient ID: Heather Dawson, female   DOB: [birth year], 68 y.o.   MRN: [id number]        Chief Complaint: follow up osteopenia, DM, obesity, CKD4, low b12, htn, hld       HPI:  Heather Dawson is a 68 y.o. female here overall doing ok, did have DXA recently with GYN, with worst T score -1.4 only.  Recommended to take vit d and calcium  but she has had elevated calcium  in the past.  Pt denies chest pain, increased sob or doe, wheezing, orthopnea, PND, increased LE swelling, palpitations, dizziness or syncope.   Pt denies polydipsia, polyuria, or new focal neuro s/s.    Pt denies fever, wt loss, night sweats, loss of appetite, or other constitutional symptoms   Has optho appt for sept 25.  Difficult to lose more wt on ozempic  1 mg weekly.  Saw renal yesterday with increased coreg to what sounds like 6.25 AM and 12.5 in PM, but pt is not sure.  U/S r/o RAS left renal has been ordered.  Pt requests labs today as was deferred yesterday at renal to today.    Wt Readings from Last 3 Encounters:  10/16/23 149 lb 3.2 oz (67.7 kg)  05/10/23 157 lb 14.4 oz (71.6 kg)  05/06/23 155 lb (70.3 kg)   BP Readings from Last 3 Encounters:  10/16/23 (!) 148/92  09/18/23 137/72  05/10/23 (!) 156/82         Past Medical History:  Diagnosis Date   Abdominal pain, epigastric 12/20/2006   ALLERGIC RHINITIS 12/25/2006   Allergy    ANXIETY 10/16/2006   no per pt   Arthritis    right shoulder   BACK PAIN 06/15/2008   Blood transfusion without reported diagnosis    BRADYCARDIA 05/04/2008   C. difficile diarrhea    Carpal tunnel syndrome 10/16/2006   Cellulitis and abscess of other specified site 06/15/2008   CHEST PAIN 09/08/2009   Chronic low back pain    CIRRHOSIS 10/16/2006   CKD (chronic kidney disease)    2008- had Ecoli and caused renal failure, no problems since then   CKD (chronic kidney disease) stage 3, GFR 30-59 ml/min (HCC) 10/16/2006   Qualifier: Diagnosis of  By: Norleen MD, [place] ORN    COMMON MIGRAINE 12/20/2006    Complication of anesthesia    awake during 2 surgeries   CONTUSION, LOWER LEG 09/15/2008   DEPRESSION 10/16/2006   no per pt   Diabetes mellitus without complication (HCC)    Esophageal reflux 12/25/2006   HYPERLIPIDEMIA 12/25/2006   Hyperlipidemia    HYPERTENSION 08/15/2006   Hypertension    HYPOTHYROIDISM 08/15/2006   Kidney failure, acute (HCC) 2009   as a result of severe E Coli infection   Left-sided carotid artery disease (HCC)    MIGRAINE HEADACHE 10/16/2006   Obesity 10/16/2006   OVARIAN CYST 12/20/2006   Psoriasis 01/03/2011   SHINGLES, HX OF 12/20/2006   Stroke (HCC)    3 strokes 2008-no deficits, only on ASA   TRANSIENT ISCHEMIC ATTACKS, HX OF 12/20/2006   VERTIGO 09/15/2007   VITAMIN B12 DEFICIENCY 01/09/2007   Past Surgical History:  Procedure Laterality Date   ABDOMINAL HYSTERECTOMY  02/2006   ANTERIOR CERVICAL DECOMP/DISCECTOMY FUSION  2005   BIOPSY  11/27/2021   Procedure: BIOPSY;  Surgeon: Leigh Elspeth SQUIBB, MD;  Location: [facility] ENDOSCOPY;  Service: Gastroenterology;;   CESAREAN SECTION     CHOLECYSTECTOMY     COLONOSCOPY  COLONOSCOPY WITH PROPOFOL  N/A 11/27/2021   Procedure: COLONOSCOPY WITH PROPOFOL ;  Surgeon: Leigh Elspeth SQUIBB, MD;  Location: [facility] ENDOSCOPY;  Service: Gastroenterology;  Laterality: N/A;   ENDARTERECTOMY Left 08/25/2014   Procedure: LEFT CAROTID ENDARTERECTOMY WITH HEMASHIELD PATCH ANGIOPLASTY;  Surgeon: Carlin FORBES Haddock, MD;  Location: [facility] OR;  Service: Vascular;  Laterality: Left;   ESOPHAGOGASTRODUODENOSCOPY (EGD) WITH PROPOFOL  N/A 11/27/2021   Procedure: ESOPHAGOGASTRODUODENOSCOPY (EGD) WITH PROPOFOL ;  Surgeon: Leigh Elspeth SQUIBB, MD;  Location: [facility] ENDOSCOPY;  Service: Gastroenterology;  Laterality: N/A;   NECK SURGERY  2005   ROTATOR CUFF REPAIR Right 02/2014   Dr Kay    reports that she has quit smoking. She has never used smokeless tobacco. She reports that she does not drink alcohol and does not use drugs. family history includes  Diabetes in her father, sister, and another family member; Heart disease in her brother, brother, father, and mother; Hypertension in her brother, brother and another family member; Lupus in her mother; Raynaud syndrome in her mother; Stroke in her father. Allergies  Allergen Reactions   Macrodantin  [Nitrofurantoin  Macrocrystal] Shortness Of Breath, Diarrhea and Other (See Comments)    GI upset   Adipex-P  [Phentermine ] Other (See Comments)    All the side effects listed on the paper   Asa [Aspirin ] Other (See Comments)    Told to avoid due to kidney function   Bactrim  [Sulfamethoxazole -Trimethoprim ] Nausea And Vomiting   Cipro [Ciprofloxacin Hcl] Swelling   Levaquin  [Levofloxacin  In D5w] Other (See Comments)    GI upset   Lipitor [Atorvastatin ] Other (See Comments)    Myalgia    Lyrica [Pregabalin] Swelling and Other (See Comments)    Headaches Confusion   Nsaids Other (See Comments)    Told to avoid due to kidney function   Vibramycin  [Doxycycline ] Nausea Only   Zinacef [Cefuroxime] Swelling   Neurontin  [Gabapentin ] Rash   Current Outpatient Medications on File Prior to Visit  Medication Sig Dispense Refill   acetaminophen  (TYLENOL ) 500 MG tablet Take 1,000 mg by mouth every 8 (eight) hours as needed (pain).     amLODipine  (NORVASC ) 10 MG tablet TAKE 1 TABLET BY MOUTH DAILY 90 tablet 3   Ascorbic Acid (VITAMIN C) 1000 MG tablet Take 1,000 mg by mouth daily.     aspirin  EC 81 MG tablet Take 81 mg by mouth daily.     carvedilol (COREG) 3.125 MG tablet Take 3.125 mg by mouth 2 (two) times daily.     Cholecalciferol  (VITAMIN D3) 1000 units CAPS Take 2 capsules (2,000 Units total) by mouth daily. 60 capsule 0   clobetasol  (TEMOVATE ) 0.05 % external solution Apply 1 application topically daily as needed (scalp psoriasis).   0   Continuous Glucose Sensor (DEXCOM G7 SENSOR) MISC      dicyclomine  (BENTYL ) 20 MG tablet Take 1 tablet (20 mg total) by mouth 2 (two) times daily as needed for  spasms. 20 tablet 0   estradiol (ESTRACE) 0.1 MG/GM vaginal cream Place 1 Applicatorful vaginally See admin instructions. Apply to vagina twice weekly on Monday, Thursday.     famotidine  (PEPCID ) 20 MG tablet TAKE 1 TABLET BY MOUTH TWICE A DAY 180 tablet 3   Fluocinolone Acetonide Body 0.01 % OIL Apply topically.     hydrALAZINE  (APRESOLINE ) 100 MG tablet TAKE 1 TABLET BY MOUTH 3 TIMES A DAY 90 tablet 11   levocetirizine (XYZAL) 5 MG tablet Take 5 mg by mouth every evening.     levothyroxine  (SYNTHROID ) 75 MCG tablet TAKE 1  TABLET BY MOUTH DAILY 90 tablet 3   lovastatin  (MEVACOR ) 40 MG tablet TAKE TWO TABLETS BY MOUTH AT BEDTIME 180 tablet 2   pantoprazole  (PROTONIX ) 40 MG tablet Take 1 tablet (40 mg total) by mouth daily. 90 tablet 3   triamcinolone  cream (KENALOG ) 0.1 % Apply 1 application  topically daily as needed (psoriasis).     TURMERIC CURCUMIN PO Take 1 tablet by mouth daily.     chlorpheniramine-HYDROcodone  (TUSSIONEX) 10-8 MG/5ML Take 5 mLs by mouth every 12 (twelve) hours as needed for cough. (Patient not taking: Reported on 10/16/2023) 115 mL 0   ondansetron  (ZOFRAN -ODT) 4 MG disintegrating tablet Take 1 tablet (4 mg total) by mouth every 8 (eight) hours as needed for nausea or vomiting. (Patient not taking: Reported on 10/16/2023) 30 tablet 1   No current facility-administered medications on file prior to visit.        ROS:  All others reviewed and negative.  Objective        PE:  BP (!) 148/92   Pulse (!) 58   Temp 98 F (36.7 C)   Ht 5' 2 (1.575 m)   Wt 149 lb 3.2 oz (67.7 kg)   SpO2 99%   BMI 27.29 kg/m                 Constitutional: Pt appears in NAD               HENT: Head: NCAT.                Right Ear: External ear normal.                 Left Ear: External ear normal.                Eyes: . Pupils are equal, round, and reactive to light. Conjunctivae and EOM are normal               Nose: without d/c or deformity               Neck: Neck supple. Gross  normal ROM               Cardiovascular: Normal rate and regular rhythm.                 Pulmonary/Chest: Effort normal and breath sounds without rales or wheezing.                Abd:  Soft, NT, ND, + BS, no organomegaly               Neurological: Pt is alert. At baseline orientation, motor grossly intact               Skin: Skin is warm. No rashes, no other new lesions, LE edema - none               Psychiatric: Pt behavior is normal without agitation   Micro: none  Cardiac tracings I have personally interpreted today:  none  Pertinent Radiological findings (summarize): none   Lab Results  Component Value Date   WBC 6.9 09/18/2023   HGB 13.5 09/18/2023   HCT 38.6 09/18/2023   PLT 281 09/18/2023   GLUCOSE 127 (H) 09/18/2023   CHOL 125 04/04/2023   TRIG 132.0 04/04/2023   HDL 41.70 04/04/2023   LDLDIRECT 107.0 10/06/2021   LDLCALC 56 04/04/2023   ALT 11 09/18/2023   AST 17 09/18/2023   NA 141 09/18/2023  K 3.9 09/18/2023   CL 105 09/18/2023   CREATININE 1.45 (H) 09/18/2023   BUN 22 09/18/2023   CO2 23 09/18/2023   TSH 2.93 04/04/2023   INR 0.99 08/17/2014   HGBA1C 5.4 04/04/2023   MICROALBUR 8.3 (H) 04/04/2023   Assessment/Plan:  Heather Dawson is a 68 y.o. White or Caucasian [1] female with  has a past medical history of Abdominal pain, epigastric (12/20/2006), ALLERGIC RHINITIS (12/25/2006), Allergy, ANXIETY (10/16/2006), Arthritis, BACK PAIN (06/15/2008), Blood transfusion without reported diagnosis, BRADYCARDIA (05/04/2008), C. difficile diarrhea, Carpal tunnel syndrome (10/16/2006), Cellulitis and abscess of other specified site (06/15/2008), CHEST PAIN (09/08/2009), Chronic low back pain, CIRRHOSIS (10/16/2006), CKD (chronic kidney disease), CKD (chronic kidney disease) stage 3, GFR 30-59 ml/min (HCC) (10/16/2006), COMMON MIGRAINE (12/20/2006), Complication of anesthesia, CONTUSION, LOWER LEG (09/15/2008), DEPRESSION (10/16/2006), Diabetes mellitus without complication (HCC),  Esophageal reflux (12/25/2006), HYPERLIPIDEMIA (12/25/2006), Hyperlipidemia, HYPERTENSION (08/15/2006), Hypertension, HYPOTHYROIDISM (08/15/2006), Kidney failure, acute (HCC) (2009), Left-sided carotid artery disease (HCC), MIGRAINE HEADACHE (10/16/2006), Obesity (10/16/2006), OVARIAN CYST (12/20/2006), Psoriasis (01/03/2011), SHINGLES, HX OF (12/20/2006), Stroke (HCC), TRANSIENT ISCHEMIC ATTACKS, HX OF (12/20/2006), VERTIGO (09/15/2007), and VITAMIN B12 DEFICIENCY (01/09/2007).  B12 deficiency Lab Results  Component Value Date   VITAMINB12 >1537 (H) 04/04/2023   Stable, cont oral replacement - b12 1000 mcg qd   CKD (chronic kidney disease) stage 4, GFR 15-29 ml/min (HCC) Lab Results  Component Value Date   CREATININE 1.45 (H) 09/18/2023   Stable overall, cont to avoid nephrotoxins, for f/u lab today  Essential hypertension BP Readings from Last 3 Encounters:  10/16/23 (!) 148/92  09/18/23 137/72  05/10/23 (!) 156/82   Uncontrolled but not yet started the increased coreg per renal,, pt to continue medical treatment coreg, norvasc  10 every day, hydralazine  100 tid   Hyperlipidemia Lab Results  Component Value Date   LDLCALC 56 04/04/2023   Stable, pt to continue current statin lovastatin  80 qhs   Hypothyroidism Lab Results  Component Value Date   TSH 2.93 04/04/2023   Stable, pt to continue levothyroxine  75 mcg every day, for f/u lab today   Type 2 diabetes mellitus with obesity (HCC) Lab Results  Component Value Date   HGBA1C 5.4 04/04/2023   In the setting of uncontrolled obesity, pt to increase ozempic  2 mg as allowed per her pt assist program acceptance   Vitamin D  deficiency For Please take OTC Vitamin D3 at 2000 units per day, indefinitely with hx of osteopenia Followup: Return in about 6 months (around 04/17/2024).  Lynwood Rush, MD 10/16/2023 1:06 PM [facility]

## 2023-10-16 NOTE — Assessment & Plan Note (Signed)
 Lab Results  Component Value Date   TSH 2.93 04/04/2023   Stable, pt to continue levothyroxine  75 mcg every day, for f/u lab today

## 2023-10-16 NOTE — Patient Instructions (Signed)
 Your husband prozac  was increased to 40 mg per day  [facility] for you to increase the ozemipic to 2mg  weekly pending the patient assist program application  Please continue all other medications as before, and refills have been done if requested.  Please have the pharmacy call with any other refills you may need.  Please keep your appointments with your specialists as you may have planned - Renal  Please go to the LAB at the blood drawing area for the tests to be done  You will be contacted by phone if any changes need to be made immediately.  Otherwise, you will receive a letter about your results with an explanation, but please check with MyChart first.  Please make an Appointment to return in 6 months, or sooner if needed

## 2023-10-16 NOTE — Progress Notes (Signed)
 The test results show that your current treatment is OK, as the tests are stable.  Please continue the same plan.  There is no other need for change of treatment or further evaluation based on these results, at this time.  thanks

## 2023-10-16 NOTE — Assessment & Plan Note (Signed)
 Lab Results  Component Value Date   VITAMINB12 >1537 (H) 04/04/2023   Stable, cont oral replacement - b12 1000 mcg qd

## 2023-10-16 NOTE — Assessment & Plan Note (Signed)
 Lab Results  Component Value Date   CREATININE 1.45 (H) 09/18/2023   Stable overall, cont to avoid nephrotoxins, for f/u lab today

## 2023-10-16 NOTE — Assessment & Plan Note (Signed)
 BP Readings from Last 3 Encounters:  10/16/23 (!) 148/92  09/18/23 137/72  05/10/23 (!) 156/82   Uncontrolled but not yet started the increased coreg per renal,, pt to continue medical treatment coreg, norvasc  10 every day, hydralazine  100 tid

## 2023-10-16 NOTE — Assessment & Plan Note (Signed)
 Lab Results  Component Value Date   HGBA1C 5.4 04/04/2023   In the setting of uncontrolled obesity, pt to increase ozempic  2 mg as allowed per her pt assist program acceptance

## 2023-10-16 NOTE — Assessment & Plan Note (Signed)
 Lab Results  Component Value Date   LDLCALC 56 04/04/2023   Stable, pt to continue current statin lovastatin 80 qhs

## 2023-10-16 NOTE — Assessment & Plan Note (Signed)
 For Please take OTC Vitamin D3 at 2000 units per day, indefinitely with hx of osteopenia

## 2023-10-17 ENCOUNTER — Encounter: Payer: Self-pay | Admitting: Internal Medicine

## 2023-10-22 ENCOUNTER — Encounter: Payer: Self-pay | Admitting: Internal Medicine

## 2023-10-22 ENCOUNTER — Other Ambulatory Visit (HOSPITAL_COMMUNITY): Payer: Self-pay | Admitting: Nephrology

## 2023-10-22 DIAGNOSIS — N1831 Chronic kidney disease, stage 3a: Secondary | ICD-10-CM

## 2023-10-22 DIAGNOSIS — I10 Essential (primary) hypertension: Secondary | ICD-10-CM

## 2023-10-29 ENCOUNTER — Ambulatory Visit (HOSPITAL_COMMUNITY)
Admission: RE | Admit: 2023-10-29 | Discharge: 2023-10-29 | Disposition: A | Discharge status: Home / Self Care | Source: Ambulatory Visit | Attending: Nephrology | Admitting: Nephrology

## 2023-10-29 ENCOUNTER — Encounter (HOSPITAL_COMMUNITY): Payer: Self-pay

## 2023-10-29 DIAGNOSIS — I10 Essential (primary) hypertension: Secondary | ICD-10-CM

## 2023-10-29 DIAGNOSIS — N1831 Chronic kidney disease, stage 3a: Secondary | ICD-10-CM

## 2023-11-14 DIAGNOSIS — E119 Type 2 diabetes mellitus without complications: Secondary | ICD-10-CM | POA: Diagnosis not present

## 2023-11-14 LAB — HM DIABETES EYE EXAM

## 2023-11-16 ENCOUNTER — Other Ambulatory Visit: Payer: Self-pay | Admitting: Internal Medicine

## 2023-11-22 ENCOUNTER — Encounter: Payer: Self-pay | Admitting: Internal Medicine

## 2023-12-02 ENCOUNTER — Encounter: Payer: Self-pay | Admitting: Internal Medicine

## 2023-12-03 ENCOUNTER — Ambulatory Visit (HOSPITAL_COMMUNITY)
Admission: RE | Admit: 2023-12-03 | Discharge: 2023-12-03 | Disposition: A | Discharge status: Home / Self Care | Source: Ambulatory Visit | Attending: Nephrology | Admitting: Nephrology

## 2023-12-03 DIAGNOSIS — K080 Exfoliation of teeth due to systemic causes: Secondary | ICD-10-CM | POA: Diagnosis not present

## 2023-12-03 DIAGNOSIS — I10 Essential (primary) hypertension: Secondary | ICD-10-CM | POA: Diagnosis not present

## 2023-12-03 DIAGNOSIS — N1831 Chronic kidney disease, stage 3a: Secondary | ICD-10-CM | POA: Diagnosis not present

## 2023-12-12 ENCOUNTER — Encounter: Payer: Self-pay | Admitting: Internal Medicine

## 2023-12-16 ENCOUNTER — Encounter: Payer: Self-pay | Admitting: Pharmacist

## 2023-12-16 NOTE — Progress Notes (Signed)
 Received Ozempic  refill request form this morning. Noted pt's MyChart messages that she is out of Ozempic  and there have been issues getting refill request form to [facility].   Filled out refill request/dose change form for Ozempic  2 mg. Left with CMA to get PCP signature upon his return 12/24/23. Left Ozempic  2 mg 4 week supply sample for patient to pick up at the office since there will be a delay getting PCP signature.  Informed patient via MyChart message that I have received form and have left sample for her at the office. Also informed patient of changes to Ozempic  PAP next year in which [facility] will no longer be providing Ozempic  PAP to Medicare patients in 2026.  Darrelyn Drum, PharmD, BCPS, CPP Clinical Pharmacist Practitioner  Primary Care at [facility] [phone]

## 2023-12-21 ENCOUNTER — Other Ambulatory Visit: Payer: Self-pay | Admitting: Internal Medicine

## 2023-12-23 ENCOUNTER — Other Ambulatory Visit: Payer: Self-pay

## 2023-12-31 ENCOUNTER — Encounter: Payer: Self-pay | Admitting: Internal Medicine

## 2024-01-03 ENCOUNTER — Encounter: Payer: Self-pay | Admitting: Internal Medicine

## 2024-01-23 DIAGNOSIS — K080 Exfoliation of teeth due to systemic causes: Secondary | ICD-10-CM | POA: Diagnosis not present

## 2024-01-27 ENCOUNTER — Encounter: Payer: Self-pay | Admitting: Cardiovascular Disease

## 2024-01-27 ENCOUNTER — Ambulatory Visit: Attending: Cardiovascular Disease | Admitting: Cardiovascular Disease

## 2024-01-27 VITALS — BP 144/76 | HR 68 | Ht 62.0 in | Wt 143.0 lb

## 2024-01-27 DIAGNOSIS — I6523 Occlusion and stenosis of bilateral carotid arteries: Secondary | ICD-10-CM | POA: Diagnosis not present

## 2024-01-27 DIAGNOSIS — E782 Mixed hyperlipidemia: Secondary | ICD-10-CM | POA: Diagnosis not present

## 2024-01-27 DIAGNOSIS — I10 Essential (primary) hypertension: Secondary | ICD-10-CM | POA: Diagnosis not present

## 2024-01-27 DIAGNOSIS — I6522 Occlusion and stenosis of left carotid artery: Secondary | ICD-10-CM

## 2024-01-27 DIAGNOSIS — I350 Nonrheumatic aortic (valve) stenosis: Secondary | ICD-10-CM

## 2024-01-27 NOTE — Assessment & Plan Note (Signed)
 History of mild aortic stenosis with 2D echo performed 11/27/2021 revealing normal LV systolic function with mild aortic stenosis.  Will repeat a 2D echocardiogram.

## 2024-01-27 NOTE — Assessment & Plan Note (Signed)
 History of hyperlipidemia on statin therapy with lipid profile performed 10/16/2023 revealing total cholesterol 135, LDL 70 and HDL 40.

## 2024-01-27 NOTE — Assessment & Plan Note (Signed)
 Morbid obesity with a BMI of 26 having lost 40 pounds in the last 2 years since I saw her on Ozempic .

## 2024-01-27 NOTE — Patient Instructions (Signed)
 Medication Instructions:  Your physician recommends that you continue on your current medications as directed. Please refer to the Current Medication list given to you today.  *If you need a refill on your cardiac medications before your next appointment, please call your pharmacy*  Testing/Procedures: Your physician has requested that you have an echocardiogram. Echocardiography is a painless test that uses sound waves to create images of your heart. It provides your doctor with information about the size and shape of your heart and how well your heart's chambers and valves are working. This procedure takes approximately one hour. There are no restrictions for this procedure. Please do NOT wear cologne, perfume, aftershave, or lotions (deodorant is allowed). Please arrive 15 minutes prior to your appointment time.  Please note: We ask at that you not bring children with you during ultrasound (echo/ vascular) testing. Due to room size and safety concerns, children are not allowed in the ultrasound rooms during exams. Our front office staff cannot provide observation of children in our lobby area while testing is being conducted. An adult accompanying a patient to their appointment will only be allowed in the ultrasound room at the discretion of the ultrasound technician under special circumstances. We apologize for any inconvenience.   Follow-Up: At [facility], you and your health needs are our priority.  As part of our continuing mission to provide you with exceptional heart care, our providers are all part of one team.  This team includes your primary Cardiologist (physician) and Advanced Practice Providers or APPs (Physician Assistants and Nurse Practitioners) who all work together to provide you with the care you need, when you need it.  Your next appointment:   12 month(s)  Provider:   Lauro Portal, MD    We recommend signing up for the patient portal called MyChart.  Sign  up information is provided on this After Visit Summary.  MyChart is used to connect with patients for Virtual Visits (Telemedicine).  Patients are able to view lab/test results, encounter notes, upcoming appointments, etc.  Non-urgent messages can be sent to your provider as well.   To learn more about what you can do with MyChart, go to [email or web address].

## 2024-01-27 NOTE — Assessment & Plan Note (Signed)
 History of left carotid endarterectomy performed by Dr. Harvey.[facility]  follows  the carotid Doppler studies.

## 2024-01-27 NOTE — Assessment & Plan Note (Signed)
 History of essential hypertension blood pressure measured today at 144/76.  She is on amlodipine , carvedilol, hydralazine .

## 2024-01-27 NOTE — Progress Notes (Signed)
 01/27/2024 Heather Dawson   10-23-1955  [id number]  Primary Physician Norleen Lynwood ORN, MD Primary Cardiologist: Dorn JINNY Lesches MD [place] [place], [place]  HPI:  Heather Dawson is a 67 y.o.  moderately overweight married Caucasian female mother of 2 children, grandmother and 4 grandchildren referred by Dr. Harvey for cardiovascular clearance prior to elective left carotid endarterectomy.  She is accompanied by her husband [name] who is also a patient of mine.  Her primary care physician is Dr. Lynwood Norleen. I last saw her in the office is 11/21/2021.  Her cardiac risk factor profile is notable for treated hypertension and hyperlipidemia. Both her parents had myocardial infarctions and are deceased. Her brother had a myocardial infarction as well. 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 She has never had a heart attack. She denies chest pain or shortness of breath. She had carotid Dopplers performed recently that showed high-grade asymptomatic left internal carotid artery stenosis and was referred to Dr. Harvey. She had a Myoview  stress test performed 08/12/14 which was low risk with normal ejection fraction. She separately underwent uncomplicated elective left carotid endarterectomy.   She did have an uncomplicated carotid endarterectomy by Dr. Harvey.  This is followed by duplex ultrasound in the vascular surgical office.  Because of atypical chest pain she had an negative Myoview  performed 07/01/2020.  She also has mild aortic stenosis by 2D echo performed 07/25/2020 with a valve area of 1.59 cm.     Since I saw her in the office 2 years ago she continues to do well.  She has lost 40 pounds on Ozempic  and feels clinically improved.  She denies chest pain or shortness of breath.   Current Meds  Medication Sig   acetaminophen  (TYLENOL ) 500 MG tablet Take 1,000 mg by mouth every 8 (eight) hours as needed (pain).   amLODipine  (NORVASC ) 10 MG tablet TAKE  1 TABLET BY MOUTH DAILY   Ascorbic Acid (VITAMIN C) 1000 MG tablet Take 1,000 mg by mouth daily.   aspirin  EC 81 MG tablet Take 81 mg by mouth daily.   carvedilol (COREG) 3.125 MG tablet Take 3.125 mg by mouth 2 (two) times daily.   Cholecalciferol  (VITAMIN D3) 1000 units CAPS Take 2 capsules (2,000 Units total) by mouth daily.   Continuous Glucose Sensor (DEXCOM G7 SENSOR) MISC    estradiol (ESTRACE) 0.1 MG/GM vaginal cream Place 1 Applicatorful vaginally See admin instructions. Apply to vagina twice weekly on Monday, Thursday.   famotidine  (PEPCID ) 20 MG tablet TAKE 1 TABLET BY MOUTH 2 TIMES A DAY   hydrALAZINE  (APRESOLINE ) 100 MG tablet TAKE 1 TABLET BY MOUTH 3 TIMES A DAY   levocetirizine (XYZAL) 5 MG tablet Take 5 mg by mouth every evening.   levothyroxine  (SYNTHROID ) 75 MCG tablet TAKE 1 TABLET BY MOUTH DAILY   lovastatin  (MEVACOR ) 40 MG tablet TAKE 2 TABLETS BY MOUTH AT BEDTIME   ondansetron  (ZOFRAN -ODT) 4 MG disintegrating tablet Take 1 tablet (4 mg total) by mouth every 8 (eight) hours as needed for nausea or vomiting.   pantoprazole  (PROTONIX ) 40 MG tablet Take 1 tablet (40 mg total) by mouth daily.   Semaglutide , 2 MG/DOSE, 8 MG/3ML SOPN Inject 2 mg as directed once a week.   triamcinolone  cream (KENALOG ) 0.1 % Apply 1 application  topically daily as needed (psoriasis).   TURMERIC CURCUMIN PO Take 1 tablet by mouth daily.     Allergies  Allergen Reactions  Macrodantin  [Nitrofurantoin  Macrocrystal] Shortness Of Breath, Diarrhea and Other (See Comments)    GI upset   Adipex-P  [Phentermine ] Other (See Comments)    All the side effects listed on the paper   Asa [Aspirin ] Other (See Comments)    Told to avoid due to kidney function   Bactrim  [Sulfamethoxazole -Trimethoprim ] Nausea And Vomiting   Cipro [Ciprofloxacin Hcl] Swelling   Levaquin  [Levofloxacin  In D5w] Other (See Comments)    GI upset   Lipitor [Atorvastatin ] Other (See Comments)    Myalgia    Lyrica [Pregabalin]  Swelling and Other (See Comments)    Headaches Confusion   Nsaids Other (See Comments)    Told to avoid due to kidney function   Vibramycin  [Doxycycline ] Nausea Only   Zinacef [Cefuroxime] Swelling   Neurontin  [Gabapentin ] Rash    Social History   Socioeconomic History   Marital status: Married    Spouse name: [name]   Number of children: 2   Years of education: Not on file   Highest education level: 12th grade  Occupational History   Occupation: diabled/RETIRED  Tobacco Use   Smoking status: Former   Smokeless tobacco: Never   Tobacco comments:    quit smoking over 30 years ago  Vaping Use   Vaping status: Never Used  Substance and Sexual Activity   Alcohol use: No   Drug use: No   Sexual activity: Not on file  Other Topics Concern   Not on file  Social History Narrative   Lives with husband-2025   Right Handed   Drinks 2-4 cups coffee   Social Drivers of Health   Financial Resource Strain: Low Risk  (04/17/2023)   Overall Financial Resource Strain (CARDIA)    Difficulty of Paying Living Expenses: Not very hard  Food Insecurity: No Food Insecurity (04/17/2023)   Hunger Vital Sign    Worried About Running Out of Food in the Last Year: Never true    Ran Out of Food in the Last Year: Never true  Transportation Needs: No Transportation Needs (04/17/2023)   PRAPARE - Transportation    Lack of Transportation (Medical): No    Lack of Transportation (Non-Medical): No  Physical Activity: Insufficiently Active (04/17/2023)   Exercise Vital Sign    Days of Exercise per Week: 3 days    Minutes of Exercise per Session: 40 min  Stress: No Stress Concern Present (04/17/2023)   [employer] of Occupational Health - Occupational Stress Questionnaire    Feeling of Stress : Not at all  Social Connections: Socially Integrated (04/17/2023)   Social Connection and Isolation Panel    Frequency of Communication with Friends and Family: More than three times a week    Frequency of  Social Gatherings with Friends and Family: Twice a week    Attends Religious Services: More than 4 times per year    Active Member of [employer] or Organizations: Yes    Attends Engineer, Structural: More than 4 times per year    Marital Status: Married  Catering Manager Violence: Not At Risk (03/11/2023)   Humiliation, Afraid, Rape, and Kick questionnaire    Fear of Current or Ex-Partner: No    Emotionally Abused: No    Physically Abused: No    Sexually Abused: No     Review of Systems: General: negative for chills, fever, night sweats or weight changes.  Cardiovascular: negative for chest pain, dyspnea on exertion, edema, orthopnea, palpitations, paroxysmal nocturnal dyspnea or shortness of breath Dermatological: negative for rash Respiratory:  negative for cough or wheezing Urologic: negative for hematuria Abdominal: negative for nausea, vomiting, diarrhea, bright red blood per rectum, melena, or hematemesis Neurologic: negative for visual changes, syncope, or dizziness All other systems reviewed and are otherwise negative except as noted above.    Blood pressure (!) 144/76, pulse 68, height 5' 2 (1.575 m), weight 143 lb (64.9 kg), SpO2 98%.  General appearance: alert and no distress Neck: no adenopathy, no carotid bruit, no JVD, supple, symmetrical, trachea midline, and thyroid  not enlarged, symmetric, no tenderness/mass/nodules Lungs: clear to auscultation bilaterally Heart: regular rate and rhythm, S1, S2 normal, no murmur, click, rub or gallop Extremities: extremities normal, atraumatic, no cyanosis or edema Pulses: 2+ and symmetric Skin: Skin color, texture, turgor normal. No rashes or lesions Neurologic: Grossly normal  EKG EKG Interpretation Date/Time:  Monday January 27 2024 15:03:34 EST Ventricular Rate:  68 PR Interval:  148 QRS Duration:  88 QT Interval:  420 QTC Calculation: 446 R Axis:   10  Text Interpretation: Normal sinus rhythm Normal ECG When  compared with ECG of 23-Nov-2022 08:05, Nonspecific T wave abnormality no longer evident in Anterior leads Confirmed by Court Carrier [id number]) on 01/27/2024 3:06:29 PM    ASSESSMENT AND PLAN:   Hyperlipidemia History of hyperlipidemia on statin therapy with lipid profile performed 10/16/2023 revealing total cholesterol 135, LDL 70 and HDL 40.  Morbid obesity (HCC) Morbid obesity with a BMI of 26 having lost 40 pounds in the last 2 years since I saw her on Ozempic .  Essential hypertension History of essential hypertension blood pressure measured today at 144/76.  She is on amlodipine , carvedilol, hydralazine .  Left carotid artery stenosis History of left carotid endarterectomy performed by Dr. Harvey.[facility]  follows  the carotid Doppler studies.  Mild aortic stenosis History of mild aortic stenosis with 2D echo performed 11/27/2021 revealing normal LV systolic function with mild aortic stenosis.  Will repeat a 2D echocardiogram.     Carrier DOROTHA Court MD [facility], [facility] 01/27/2024 3:15 PM

## 2024-01-29 DIAGNOSIS — S61419A Laceration without foreign body of unspecified hand, initial encounter: Secondary | ICD-10-CM | POA: Insufficient documentation

## 2024-02-09 ENCOUNTER — Encounter: Payer: Self-pay | Admitting: Internal Medicine

## 2024-02-10 MED ORDER — SEMAGLUTIDE (2 MG/DOSE) 8 MG/3ML ~~LOC~~ SOPN: 2.0000 mg | PEN_INJECTOR | SUBCUTANEOUS | 3 refills | Status: DC

## 2024-02-11 LAB — LAB REPORT - SCANNED: EGFR: 45

## 2024-02-17 ENCOUNTER — Telehealth: Payer: Self-pay | Admitting: Cardiovascular Disease

## 2024-02-17 NOTE — Telephone Encounter (Signed)
 Pt calling in regards to office visit with Nephrologist recently. Per pt, Dr. Rayburn suggested Dr. Court look at her renal US  due to it being 60% blocked to determine if Cardiology wanted to put in any kind of stent. Last US  12/03/2023. Pt hx of only kidney. ECHO scheduled for January 12th and pt asking if she should be seen before ECHO or to make it all one appointment after resulted.   Will forward to Dr. Court for any further recommendations. Pt verbalizes understanding of plan and educated someone will reach back out to her with next steps.

## 2024-02-17 NOTE — Telephone Encounter (Signed)
 Patient was referred by Dr. Rayburn for left renal artery stenosis. Patient is scheduled to have an Echo completed on 03/02/24. Patient would like to know if the Echo will check for stenosis of the kidney. Patient is concerned that she may need a stent per her conversation with Dr. Rayburn. Please advise.

## 2024-02-18 NOTE — Telephone Encounter (Signed)
 Left detailed message for patient with Dr. Ranee response: I've already spoken to her Nephrologist, Dr Rayburn about her and the renal dopplers   Provided office number for callback if any questions.

## 2024-03-02 ENCOUNTER — Ambulatory Visit: Payer: Self-pay | Admitting: Cardiovascular Disease

## 2024-03-02 ENCOUNTER — Ambulatory Visit (HOSPITAL_COMMUNITY)
Admission: RE | Admit: 2024-03-02 | Discharge: 2024-03-02 | Disposition: A | Discharge status: Home / Self Care | Source: Ambulatory Visit | Attending: Cardiovascular Disease | Admitting: Cardiovascular Disease

## 2024-03-02 DIAGNOSIS — I10 Essential (primary) hypertension: Secondary | ICD-10-CM | POA: Diagnosis present

## 2024-03-02 DIAGNOSIS — E782 Mixed hyperlipidemia: Secondary | ICD-10-CM | POA: Insufficient documentation

## 2024-03-02 DIAGNOSIS — I350 Nonrheumatic aortic (valve) stenosis: Secondary | ICD-10-CM | POA: Diagnosis present

## 2024-03-02 LAB — ECHOCARDIOGRAM COMPLETE
AR max vel: 1.99 cm2
AV Area VTI: 1.9 cm2
AV Area mean vel: 1.79 cm2
AV Mean grad: 6 mmHg
AV Peak grad: 10.5 mmHg
Ao pk vel: 1.62 m/s
Area-P 1/2: 2.66 cm2
S' Lateral: 2.69 cm

## 2024-03-03 ENCOUNTER — Encounter: Payer: Self-pay | Admitting: Cardiovascular Disease

## 2024-03-11 ENCOUNTER — Ambulatory Visit: Payer: Medicare Other

## 2024-03-11 ENCOUNTER — Encounter: Payer: Self-pay | Admitting: *Deleted

## 2024-03-11 NOTE — Progress Notes (Signed)
 Heather Dawson                                          MRN: [id number]   03/11/2024   The [facility] Quality Team Specialist reviewed this patient medical record for the purposes of chart review for care gap closure. The following were reviewed: chart review for care gap closure-controlling blood pressure.    [facility]BCI Quality Team

## 2024-03-13 ENCOUNTER — Encounter: Payer: Self-pay | Admitting: Internal Medicine

## 2024-03-13 ENCOUNTER — Ambulatory Visit: Payer: Medicare Other

## 2024-03-13 ENCOUNTER — Ambulatory Visit: Admitting: Internal Medicine

## 2024-03-13 VITALS — BP 120/78 | HR 55 | Temp 98.8°F | Ht 62.0 in | Wt 141.0 lb

## 2024-03-13 DIAGNOSIS — Z23 Encounter for immunization: Secondary | ICD-10-CM

## 2024-03-13 DIAGNOSIS — E039 Hypothyroidism, unspecified: Secondary | ICD-10-CM

## 2024-03-13 DIAGNOSIS — Z7985 Long-term (current) use of injectable non-insulin antidiabetic drugs: Secondary | ICD-10-CM

## 2024-03-13 DIAGNOSIS — E1122 Type 2 diabetes mellitus with diabetic chronic kidney disease: Secondary | ICD-10-CM | POA: Diagnosis not present

## 2024-03-13 DIAGNOSIS — E559 Vitamin D deficiency, unspecified: Secondary | ICD-10-CM | POA: Diagnosis not present

## 2024-03-13 DIAGNOSIS — N184 Chronic kidney disease, stage 4 (severe): Secondary | ICD-10-CM | POA: Diagnosis not present

## 2024-03-13 DIAGNOSIS — I10 Essential (primary) hypertension: Secondary | ICD-10-CM | POA: Diagnosis not present

## 2024-03-13 DIAGNOSIS — E782 Mixed hyperlipidemia: Secondary | ICD-10-CM

## 2024-03-13 DIAGNOSIS — E538 Deficiency of other specified B group vitamins: Secondary | ICD-10-CM

## 2024-03-13 NOTE — Patient Instructions (Signed)
 You had the flu shot today  Please continue all other medications as before, and refills have been done if requested.  Please have the pharmacy call with any other refills you may need.  Please continue your efforts at being more active, low cholesterol diet, and weight control.  You are otherwise up to date with prevention measures today.  Please keep your appointments with your specialists as you may have planned - Renal  We can hold on lab testing today  Please make an Appointment to return in 6 months, or sooner if needed

## 2024-03-13 NOTE — Progress Notes (Signed)
 Patient ID: Heather Dawson, female   DOB: [birth year], 69 y.o.   MRN: [id number]        Chief Complaint: follow up dm, ckd4, htn, low thyroid , low vit d        HPI:  Heather Dawson is a 69 y.o. female here overall doing ok, Pt denies chest pain, increased sob or doe, wheezing, orthopnea, PND, increased LE swelling, palpitations, dizziness or syncope.   Pt denies polydipsia, polyuria, or new focal neuro s/s.    Pt denies fever, wt loss, night sweats, loss of appetite, or other constitutional symptoms   Due for flu shot       Wt Readings from Last 3 Encounters:  03/13/24 141 lb (64 kg)  01/27/24 143 lb (64.9 kg)  10/16/23 149 lb 3.2 oz (67.7 kg)   BP Readings from Last 3 Encounters:  03/13/24 120/78  01/27/24 (!) 144/76  10/16/23 (!) 148/92         Past Medical History:  Diagnosis Date   Abdominal pain, epigastric 12/20/2006   ALLERGIC RHINITIS 12/25/2006   Allergy    ANXIETY 10/16/2006   no per pt   Arthritis    right shoulder   BACK PAIN 06/15/2008   Blood transfusion without reported diagnosis    BRADYCARDIA 05/04/2008   C. difficile diarrhea    Carpal tunnel syndrome 10/16/2006   Cellulitis and abscess of other specified site 06/15/2008   CHEST PAIN 09/08/2009   Chronic low back pain    CIRRHOSIS 10/16/2006   CKD (chronic kidney disease)    2008- had Ecoli and caused renal failure, no problems since then   CKD (chronic kidney disease) stage 3, GFR 30-59 ml/min (HCC) 10/16/2006   Qualifier: Diagnosis of  By: Norleen MD, Lynwood ORN    COMMON MIGRAINE 12/20/2006   Complication of anesthesia    awake during 2 surgeries   CONTUSION, LOWER LEG 09/15/2008   DEPRESSION 10/16/2006   no per pt   Diabetes mellitus without complication (HCC)    Esophageal reflux 12/25/2006   HYPERLIPIDEMIA 12/25/2006   Hyperlipidemia    HYPERTENSION 08/15/2006   Hypertension    HYPOTHYROIDISM 08/15/2006   Kidney failure, acute 2009   as a result of severe E Coli infection   Left-sided carotid artery disease     MIGRAINE HEADACHE 10/16/2006   Obesity 10/16/2006   OVARIAN CYST 12/20/2006   Psoriasis 01/03/2011   SHINGLES, HX OF 12/20/2006   Stroke (HCC)    3 strokes 2008-no deficits, only on ASA   TRANSIENT ISCHEMIC ATTACKS, HX OF 12/20/2006   VERTIGO 09/15/2007   VITAMIN B12 DEFICIENCY 01/09/2007   Past Surgical History:  Procedure Laterality Date   ABDOMINAL HYSTERECTOMY  02/2006   ANTERIOR CERVICAL DECOMP/DISCECTOMY FUSION  2005   BIOPSY  11/27/2021   Procedure: BIOPSY;  Surgeon: Leigh Elspeth SQUIBB, MD;  Location: [facility];  Service: Gastroenterology;;   CESAREAN SECTION     CHOLECYSTECTOMY     COLONOSCOPY     COLONOSCOPY WITH PROPOFOL  N/A 11/27/2021   Procedure: COLONOSCOPY WITH PROPOFOL ;  Surgeon: Leigh Elspeth SQUIBB, MD;  Location: [facility];  Service: Gastroenterology;  Laterality: N/A;   ENDARTERECTOMY Left 08/25/2014   Procedure: LEFT CAROTID ENDARTERECTOMY WITH HEMASHIELD PATCH ANGIOPLASTY;  Surgeon: Carlin FORBES Haddock, MD;  Location: [facility];  Service: Vascular;  Laterality: Left;   ESOPHAGOGASTRODUODENOSCOPY (EGD) WITH PROPOFOL  N/A 11/27/2021   Procedure: ESOPHAGOGASTRODUODENOSCOPY (EGD) WITH PROPOFOL ;  Surgeon: Leigh Elspeth SQUIBB, MD;  Location: [facility];  Service: Gastroenterology;  Laterality: N/A;   NECK SURGERY  2005   ROTATOR CUFF REPAIR Right 02/2014   Dr Kay    reports that she has quit smoking. She has never used smokeless tobacco. She reports that she does not drink alcohol and does not use drugs. family history includes Diabetes in her father, sister, and another family member; Heart disease in her brother, brother, father, and mother; Hypertension in her brother, brother and another family member; Lupus in her mother; Raynaud syndrome in her mother; Stroke in her father. Allergies[1] Medications Ordered Prior to Encounter[2]      ROS:  All others reviewed and negative.  Objective        PE:  BP 120/78 (BP Location: Right Arm, Patient Position: Sitting, Cuff  Size: Normal)   Pulse (!) 55   Temp 98.8 F (37.1 C) (Oral)   Ht 5' 2 (1.575 m)   Wt 141 lb (64 kg)   SpO2 98%   BMI 25.79 kg/m                 Constitutional: Pt appears in NAD               HENT: Head: NCAT.                Right Ear: External ear normal.                 Left Ear: External ear normal.                Eyes: . Pupils are equal, round, and reactive to light. Conjunctivae and EOM are normal               Nose: without d/c or deformity               Neck: Neck supple. Gross normal ROM               Cardiovascular: Normal rate and regular rhythm.                 Pulmonary/Chest: Effort normal and breath sounds without rales or wheezing.                Abd:  Soft, NT, ND, + BS, no organomegaly               Neurological: Pt is alert. At baseline orientation, motor grossly intact               Skin: Skin is warm. No rashes, no other new lesions, LE edema - none               Psychiatric: Pt behavior is normal without agitation   Micro: none  Cardiac tracings I have personally interpreted today:  none  Pertinent Radiological findings (summarize): none   Lab Results  Component Value Date   WBC 6.3 10/16/2023   HGB 14.5 10/16/2023   HCT 43.4 10/16/2023   PLT 256.0 10/16/2023   GLUCOSE 95 10/16/2023   CHOL 135 10/16/2023   TRIG 123.0 10/16/2023   HDL 40.30 10/16/2023   LDLDIRECT 107.0 10/06/2021   LDLCALC 70 10/16/2023   ALT 14 10/16/2023   AST 16 10/16/2023   NA 141 10/16/2023   K 4.1 10/16/2023   CL 104 10/16/2023   CREATININE 1.39 (H) 10/16/2023   BUN 21 10/16/2023   CO2 26 10/16/2023   TSH 2.93 04/04/2023   INR 0.99 08/17/2014   HGBA1C 5.5 10/16/2023   MICROALBUR 8.3 (H) 04/04/2023  Assessment/Plan:  Heather Dawson is a 69 y.o. White or Caucasian [1] female with  has a past medical history of Abdominal pain, epigastric (12/20/2006), ALLERGIC RHINITIS (12/25/2006), Allergy, ANXIETY (10/16/2006), Arthritis, BACK PAIN (06/15/2008), Blood transfusion without  reported diagnosis, BRADYCARDIA (05/04/2008), C. difficile diarrhea, Carpal tunnel syndrome (10/16/2006), Cellulitis and abscess of other specified site (06/15/2008), CHEST PAIN (09/08/2009), Chronic low back pain, CIRRHOSIS (10/16/2006), CKD (chronic kidney disease), CKD (chronic kidney disease) stage 3, GFR 30-59 ml/min (HCC) (10/16/2006), COMMON MIGRAINE (12/20/2006), Complication of anesthesia, CONTUSION, LOWER LEG (09/15/2008), DEPRESSION (10/16/2006), Diabetes mellitus without complication (HCC), Esophageal reflux (12/25/2006), HYPERLIPIDEMIA (12/25/2006), Hyperlipidemia, HYPERTENSION (08/15/2006), Hypertension, HYPOTHYROIDISM (08/15/2006), Kidney failure, acute (2009), Left-sided carotid artery disease, MIGRAINE HEADACHE (10/16/2006), Obesity (10/16/2006), OVARIAN CYST (12/20/2006), Psoriasis (01/03/2011), SHINGLES, HX OF (12/20/2006), Stroke (HCC), TRANSIENT ISCHEMIC ATTACKS, HX OF (12/20/2006), VERTIGO (09/15/2007), and VITAMIN B12 DEFICIENCY (01/09/2007).  Diabetes mellitus with chronic kidney disease (HCC) Ckd4  Lab Results  Component Value Date   HGBA1C 5.5 10/16/2023   Stable, pt to continue current medical treatment ozempic  2 mg weekly, f/u renal as planned with labs     B12 deficiency Lab Results  Component Value Date   VITAMINB12 1,479 (H) 10/16/2023   Stable, cont oral replacement - b12 1000 mcg qd   Essential hypertension BP Readings from Last 3 Encounters:  03/13/24 120/78  01/27/24 (!) 144/76  10/16/23 (!) 148/92   Stable, pt to continue medical treatment coreg 6.25 bid, norvasc  10 every day, hydralazine  100 tid, lisiniopril 20 qd   Hyperlipidemia Lab Results  Component Value Date   LDLCALC 70 10/16/2023   Stable, pt to continue current statin lovastatin  40 mg qd   Hypothyroidism Lab Results  Component Value Date   TSH 2.93 04/04/2023   Stable, pt to continue levothyroxine  75 mcg qd   Vitamin D  deficiency Last vitamin D  Lab Results  Component Value Date    VD25OH 74.89 10/16/2023   Stable, cont oral replacement  Followup: Return in about 6 months (around 09/10/2024).  Lynwood Rush, MD 03/14/2024 6:05 PM [facility]     [1]  Allergies Allergen Reactions   Macrodantin  [Nitrofurantoin  Macrocrystal] Shortness Of Breath, Diarrhea and Other (See Comments)    GI upset   Adipex-P  [Phentermine ] Other (See Comments)    All the side effects listed on the paper   Dorethia Razor ] Other (See Comments)    Told to avoid due to kidney function   Bactrim  [Sulfamethoxazole -Trimethoprim ] Nausea And Vomiting   Cipro [Ciprofloxacin Hcl] Swelling   Levaquin  [Levofloxacin  In D5w] Other (See Comments)    GI upset   Lipitor [Atorvastatin ] Other (See Comments)    Myalgia    Lyrica [Pregabalin] Swelling and Other (See Comments)    Headaches Confusion   Nsaids Other (See Comments)    Told to avoid due to kidney function   Vibramycin  [Doxycycline ] Nausea Only   Zinacef [Cefuroxime] Swelling   Neurontin  [Gabapentin ] Rash  [2]  Current Outpatient Medications on File Prior to Visit  Medication Sig Dispense Refill   carvedilol (COREG) 6.25 MG tablet Take 6.25 mg by mouth 2 (two) times daily.     acetaminophen  (TYLENOL ) 500 MG tablet Take 1,000 mg by mouth every 8 (eight) hours as needed (pain).     amLODipine  (NORVASC ) 10 MG tablet TAKE 1 TABLET BY MOUTH DAILY 90 tablet 3   Ascorbic Acid (VITAMIN C) 1000 MG tablet Take 1,000 mg by mouth daily.  aspirin  EC 81 MG tablet Take 81 mg by mouth daily.     carvedilol (COREG) 3.125 MG tablet Take 3.125 mg by mouth 2 (two) times daily.     chlorpheniramine-HYDROcodone  (TUSSIONEX) 10-8 MG/5ML Take 5 mLs by mouth every 12 (twelve) hours as needed for cough. (Patient not taking: Reported on 01/27/2024) 115 mL 0   Cholecalciferol  (VITAMIN D3) 1000 units CAPS Take 2 capsules (2,000 Units total) by mouth daily. 60 capsule 0   Continuous Glucose Sensor  (DEXCOM G7 SENSOR) MISC      dicyclomine  (BENTYL ) 20 MG tablet Take 1 tablet (20 mg total) by mouth 2 (two) times daily as needed for spasms. (Patient not taking: Reported on 01/27/2024) 20 tablet 0   estradiol (ESTRACE) 0.1 MG/GM vaginal cream Place 1 Applicatorful vaginally See admin instructions. Apply to vagina twice weekly on Monday, Thursday.     famotidine  (PEPCID ) 20 MG tablet TAKE 1 TABLET BY MOUTH 2 TIMES A DAY 180 tablet 3   hydrALAZINE  (APRESOLINE ) 100 MG tablet TAKE 1 TABLET BY MOUTH 3 TIMES A DAY 90 tablet 11   levocetirizine (XYZAL) 5 MG tablet Take 5 mg by mouth every evening.     levothyroxine  (SYNTHROID ) 75 MCG tablet TAKE 1 TABLET BY MOUTH DAILY 90 tablet 3   lisinopril  (ZESTRIL ) 20 MG tablet TAKE 1 TABLET BY MOUTH DAILY . GENERIC EQUIVALENT FOR ZESTRIL      lovastatin  (MEVACOR ) 40 MG tablet TAKE 2 TABLETS BY MOUTH AT BEDTIME 180 tablet 2   pantoprazole  (PROTONIX ) 40 MG tablet Take 1 tablet (40 mg total) by mouth daily. 90 tablet 3   Semaglutide , 2 MG/DOSE, 8 MG/3ML SOPN Inject 2 mg as directed once a week. 6 mL 3   SUMAtriptan  (IMITREX ) 100 MG tablet 100 mg by oral route.     TURMERIC CURCUMIN PO Take 1 tablet by mouth daily.     No current facility-administered medications on file prior to visit.

## 2024-03-13 NOTE — Assessment & Plan Note (Signed)
 Ckd4  Lab Results  Component Value Date   HGBA1C 5.5 10/16/2023   Stable, pt to continue current medical treatment ozempic  2 mg weekly, f/u renal as planned with labs

## 2024-03-14 ENCOUNTER — Encounter: Payer: Self-pay | Admitting: Internal Medicine

## 2024-03-14 NOTE — Assessment & Plan Note (Signed)
 Lab Results  Component Value Date   VITAMINB12 1,479 (H) 10/16/2023   Stable, cont oral replacement - b12 1000 mcg qd

## 2024-03-14 NOTE — Assessment & Plan Note (Signed)
 Last vitamin D  Lab Results  Component Value Date   VD25OH 74.89 10/16/2023   Stable, cont oral replacement

## 2024-03-14 NOTE — Assessment & Plan Note (Signed)
 Lab Results  Component Value Date   TSH 2.93 04/04/2023   Stable, pt to continue levothyroxine 75 mcg qd

## 2024-03-14 NOTE — Assessment & Plan Note (Signed)
 BP Readings from Last 3 Encounters:  03/13/24 120/78  01/27/24 (!) 144/76  10/16/23 (!) 148/92   Stable, pt to continue medical treatment coreg 6.25 bid, norvasc  10 every day, hydralazine  100 tid, lisiniopril 20 qd

## 2024-03-14 NOTE — Assessment & Plan Note (Signed)
 Lab Results  Component Value Date   LDLCALC 70 10/16/2023   Stable, pt to continue current statin lovastatin  40 mg qd

## 2024-03-16 MED ORDER — LEVOTHYROXINE SODIUM 75 MCG PO TABS: 75.0000 ug | ORAL_TABLET | Freq: Every day | ORAL | 3 refills | Status: AC

## 2024-03-16 MED ORDER — HYDRALAZINE HCL 100 MG PO TABS: 100.0000 mg | ORAL_TABLET | Freq: Three times a day (TID) | ORAL | 11 refills | Status: AC

## 2024-03-16 MED ORDER — FAMOTIDINE 20 MG PO TABS: 20.0000 mg | ORAL_TABLET | Freq: Two times a day (BID) | ORAL | 3 refills | Status: AC

## 2024-03-16 MED ORDER — AMLODIPINE BESYLATE 10 MG PO TABS: 10.0000 mg | ORAL_TABLET | Freq: Every day | ORAL | 3 refills | Status: AC

## 2024-03-16 MED ORDER — SEMAGLUTIDE (2 MG/DOSE) 8 MG/3ML ~~LOC~~ SOPN: 2.0000 mg | PEN_INJECTOR | SUBCUTANEOUS | 3 refills | Status: AC

## 2024-03-16 MED ORDER — PANTOPRAZOLE SODIUM 40 MG PO TBEC: 40.0000 mg | DELAYED_RELEASE_TABLET | Freq: Every day | ORAL | 3 refills | Status: AC

## 2024-03-16 MED ORDER — LOVASTATIN 40 MG PO TABS: 80.0000 mg | ORAL_TABLET | Freq: Every day | ORAL | 2 refills | Status: AC

## 2024-03-20 ENCOUNTER — Ambulatory Visit

## 2024-03-20 VITALS — BP 136/80 | HR 71 | Ht 62.0 in | Wt 141.6 lb

## 2024-03-20 DIAGNOSIS — Z0000 Encounter for general adult medical examination without abnormal findings: Secondary | ICD-10-CM

## 2024-03-20 NOTE — Progress Notes (Signed)
 "  Chief Complaint  Patient presents with   Medicare Wellness     Subjective:   Heather Dawson is a 69 y.o. female who presents for a Medicare Annual Wellness Visit.  Visit info / Clinical Intake: Medicare Wellness Visit Type:: Subsequent Annual Wellness Visit Persons participating in visit and providing information:: patient Medicare Wellness Visit Mode:: In-person (required for WTM) Interpreter Needed?: No Pre-visit prep was completed: yes AWV questionnaire completed by patient prior to visit?: yes Date:: 03/16/24 Living arrangements:: (Patient-Rptd) lives with spouse/significant other Patient's Overall Health Status Rating: (Patient-Rptd) good Typical amount of pain: (Patient-Rptd) some Does pain affect daily life?: (!) (Patient-Rptd) yes Are you currently prescribed opioids?: no  Dietary Habits and Nutritional Risks How many meals a day?: (Patient-Rptd) 2 Eats fruit and vegetables daily?: (!) (Patient-Rptd) no Most meals are obtained by: (Patient-Rptd) preparing own meals In the last 2 weeks, have you had any of the following?: none Diabetic:: (!) yes Any non-healing wounds?: (!) yes How often do you check your BS?: continuous glucose monitor (Dexacom) Would you like to be referred to a Nutritionist or for Diabetic Management? : no  Functional Status Activities of Daily Living (to include ambulation/medication): (Patient-Rptd) Independent Ambulation: Independent with device- listed below Home Assistive Devices/Equipment: Eyeglasses Medication Administration: (Patient-Rptd) Independent Home Management (perform basic housework or laundry): (Patient-Rptd) Independent Manage your own finances?: (Patient-Rptd) yes Primary transportation is: (Patient-Rptd) driving Concerns about vision?: no *vision screening is required for WTM* Concerns about hearing?: no  Fall Screening Falls in the past year?: (Patient-Rptd) 0 Number of falls in past year: 0 Was there an injury with  Fall?: 0 Fall Risk Category Calculator: 0 Patient Fall Risk Level: Low Fall Risk  Fall Risk Patient at Risk for Falls Due to: No Fall Risks Fall risk Follow up: Falls evaluation completed; Falls prevention discussed  Home and Transportation Safety: All rugs have non-skid backing?: (Patient-Rptd) yes All stairs or steps have railings?: (Patient-Rptd) yes Grab bars in the bathtub or shower?: (!) (Patient-Rptd) no Have non-skid surface in bathtub or shower?: (Patient-Rptd) yes Good home lighting?: (Patient-Rptd) yes Regular seat belt use?: (Patient-Rptd) yes Hospital stays in the last year:: (Patient-Rptd) no  Cognitive Assessment Difficulty concentrating, remembering, or making decisions? : (Patient-Rptd) no Will 6CIT or Mini Cog be Completed: no 6CIT or Mini Cog Declined: patient alert, oriented, able to answer questions appropriately and recall recent events  Advance Directives (For Healthcare) Does Patient Have a Medical Advance Directive?: Yes Type of Advance Directive: Healthcare Power of [place]; Living will Copy of Healthcare Power of Attorney in Chart?: No - copy requested Copy of Living Will in Chart?: No - copy requested  Reviewed/Updated  Reviewed/Updated: Reviewed All (Medical, Surgical, Family, Medications, Allergies, Care Teams, Patient Goals)    Allergies (verified) Macrodantin  [nitrofurantoin  macrocrystal], Adipex-p  [phentermine ], Asa [aspirin ], Bactrim  [sulfamethoxazole -trimethoprim ], Cipro [ciprofloxacin hcl], Levaquin  [levofloxacin  in d5w], Lipitor [atorvastatin ], Lyrica [pregabalin], Nsaids, Vibramycin  [doxycycline ], Zinacef [cefuroxime], and Neurontin  [gabapentin ]   Current Medications (verified) Outpatient Encounter Medications as of 03/20/2024  Medication Sig   acetaminophen  (TYLENOL ) 500 MG tablet Take 1,000 mg by mouth every 8 (eight) hours as needed (pain).   amLODipine  (NORVASC ) 10 MG tablet Take 1 tablet (10 mg total) by mouth daily.   Ascorbic Acid  (VITAMIN C) 1000 MG tablet Take 1,000 mg by mouth daily.   aspirin  EC 81 MG tablet Take 81 mg by mouth daily.   carvedilol (COREG) 6.25 MG tablet Take 6.25 mg by mouth 2 (two) times daily.   Cholecalciferol  (  VITAMIN D3) 1000 units CAPS Take 2 capsules (2,000 Units total) by mouth daily.   Continuous Glucose Sensor (DEXCOM G7 SENSOR) MISC    estradiol (ESTRACE) 0.1 MG/GM vaginal cream Place 1 Applicatorful vaginally See admin instructions. Apply to vagina twice weekly on Monday, Thursday.   famotidine  (PEPCID ) 20 MG tablet Take 1 tablet (20 mg total) by mouth 2 (two) times daily.   hydrALAZINE  (APRESOLINE ) 100 MG tablet Take 1 tablet (100 mg total) by mouth 3 (three) times daily.   levocetirizine (XYZAL) 5 MG tablet Take 5 mg by mouth every evening.   levothyroxine  (SYNTHROID ) 75 MCG tablet Take 1 tablet (75 mcg total) by mouth daily.   lisinopril  (ZESTRIL ) 20 MG tablet TAKE 1 TABLET BY MOUTH DAILY . GENERIC EQUIVALENT FOR ZESTRIL    lovastatin  (MEVACOR ) 40 MG tablet Take 2 tablets (80 mg total) by mouth at bedtime.   pantoprazole  (PROTONIX ) 40 MG tablet Take 1 tablet (40 mg total) by mouth daily.   Semaglutide , 2 MG/DOSE, 8 MG/3ML SOPN Inject 2 mg as directed once a week.   SUMAtriptan  (IMITREX ) 100 MG tablet 100 mg by oral route.   TURMERIC CURCUMIN PO Take 1 tablet by mouth daily.   No facility-administered encounter medications on file as of 03/20/2024.    History: Past Medical History:  Diagnosis Date   Abdominal pain, epigastric 12/20/2006   ALLERGIC RHINITIS 12/25/2006   Allergy    ANXIETY 10/16/2006   no per pt   Arthritis    right shoulder   BACK PAIN 06/15/2008   Blood transfusion without reported diagnosis    BRADYCARDIA 05/04/2008   C. difficile diarrhea    Carpal tunnel syndrome 10/16/2006   Cellulitis and abscess of other specified site 06/15/2008   CHEST PAIN 09/08/2009   Chronic low back pain    CIRRHOSIS 10/16/2006   CKD (chronic kidney disease)    2008- had Ecoli and  caused renal failure, no problems since then   CKD (chronic kidney disease) stage 3, GFR 30-59 ml/min (HCC) 10/16/2006   Qualifier: Diagnosis of  By: Norleen MD, [place] ORN    COMMON MIGRAINE 12/20/2006   Complication of anesthesia    awake during 2 surgeries   CONTUSION, LOWER LEG 09/15/2008   DEPRESSION 10/16/2006   no per pt   Diabetes mellitus without complication (HCC)    Esophageal reflux 12/25/2006   HYPERLIPIDEMIA 12/25/2006   Hyperlipidemia    HYPERTENSION 08/15/2006   Hypertension    HYPOTHYROIDISM 08/15/2006   Kidney failure, acute 2009   as a result of severe E Coli infection   Left-sided carotid artery disease    MIGRAINE HEADACHE 10/16/2006   Obesity 10/16/2006   OVARIAN CYST 12/20/2006   Psoriasis 01/03/2011   SHINGLES, HX OF 12/20/2006   Stroke (HCC)    3 strokes 2008-no deficits, only on ASA   TRANSIENT ISCHEMIC ATTACKS, HX OF 12/20/2006   VERTIGO 09/15/2007   VITAMIN B12 DEFICIENCY 01/09/2007   Past Surgical History:  Procedure Laterality Date   ABDOMINAL HYSTERECTOMY  02/2006   ANTERIOR CERVICAL DECOMP/DISCECTOMY FUSION  2005   BIOPSY  11/27/2021   Procedure: BIOPSY;  Surgeon: Leigh Elspeth SQUIBB, MD;  Location: [facility] ENDOSCOPY;  Service: Gastroenterology;;   CESAREAN SECTION     CHOLECYSTECTOMY     COLONOSCOPY     COLONOSCOPY WITH PROPOFOL  N/A 11/27/2021   Procedure: COLONOSCOPY WITH PROPOFOL ;  Surgeon: Leigh Elspeth SQUIBB, MD;  Location: [facility] END[facility]COPY;  Service: Gastroenterology;  Laterality: N/A;   ENDARTERECTOMY Left 08/25/2014  Procedure: LEFT CAROTID ENDARTERECTOMY WITH HEMASHIELD PATCH ANGIOPLASTY;  Surgeon: Carlin FORBES Haddock, MD;  Location: [facility];  Service: Vascular;  Laterality: Left;   ESOPHAGOGASTRODUODENOSCOPY (EGD) WITH PROPOFOL  N/A 11/27/2021   Procedure: ESOPHAGOGASTRODUODENOSCOPY (EGD) WITH PROPOFOL ;  Surgeon: Leigh Elspeth SQUIBB, MD;  Location: [facility] ENDOSCOPY;  Service: Gastroenterology;  Laterality: N/A;   NECK SURGERY  2005   ROTATOR CUFF REPAIR Right 02/2014    Dr Kay   Family History  Problem Relation Age of Onset   Stroke Father    Heart disease Father    Diabetes Father    Lupus Mother    Raynaud syndrome Mother    Heart disease Mother    Diabetes Sister    Heart disease Brother    Hypertension Brother    Heart disease Brother    Hypertension Brother    Diabetes Other    Hypertension Other    Colon cancer Neg Hx    Esophageal cancer Neg Hx    Rectal cancer Neg Hx    Stomach cancer Neg Hx    Social History   Occupational History   Occupation: diabled/RETIRED  Tobacco Use   Smoking status: Former   Smokeless tobacco: Never   Tobacco comments:    quit smoking over 30 years ago  Vaping Use   Vaping status: Never Used  Substance and Sexual Activity   Alcohol use: No   Drug use: No   Sexual activity: Not on file   Tobacco Counseling Counseling given: Not Answered Tobacco comments: quit smoking over 30 years ago  SDOH Screenings   Food Insecurity: No Food Insecurity (03/20/2024)  Housing: Low Risk (03/20/2024)  Transportation Needs: No Transportation Needs (03/20/2024)  Utilities: Not At Risk (03/20/2024)  Alcohol Screen: Low Risk (03/11/2023)  Depression (PHQ2-9): Low Risk (03/20/2024)  Financial Resource Strain: Low Risk (03/12/2024)  Physical Activity: Insufficiently Active (03/20/2024)  Social Connections: Socially Integrated (03/20/2024)  Stress: No Stress Concern Present (03/20/2024)  Tobacco Use: Medium Risk (03/20/2024)  Health Literacy: Adequate Health Literacy (03/20/2024)   See flowsheets for full screening details  Depression Screen PHQ 2 & 9 Depression Scale- Over the past 2 weeks, how often have you been bothered by any of the following problems? Little interest or pleasure in doing things: 0 Feeling down, depressed, or hopeless (PHQ Adolescent also includes...irritable): 0 PHQ-2 Total Score: 0 Trouble falling or staying asleep, or sleeping too much: 0 Feeling tired or having little energy: 0 Poor  appetite or overeating (PHQ Adolescent also includes...weight loss): 0 Feeling bad about yourself - or that you are a failure or have let yourself or your family down: 0 Trouble concentrating on things, such as reading the newspaper or watching television (PHQ Adolescent also includes...like school work): 0 Moving or speaking so slowly that other people could have noticed. Or the opposite - being so fidgety or restless that you have been moving around a lot more than usual: 0 Thoughts that you would be better off dead, or of hurting yourself in some way: 0 PHQ-9 Total Score: 0 If you checked off any problems, how difficult have these problems made it for you to do your work, take care of things at home, or get along with other people?: Not difficult at all  Depression Treatment Depression Interventions/Treatment : EYV7-0 Score <4 Follow-up Not Indicated     Goals Addressed               This Visit's Progress     Patient Stated (pt-stated)  To maintain her weight/2026             Objective:    Today's Vitals   03/20/24 1448  BP: 136/80  Pulse: 71  SpO2: 99%  Weight: 141 lb 9.6 oz (64.2 kg)  Height: 5' 2 (1.575 m)   Body mass index is 25.9 kg/m.  Hearing/Vision screen Hearing Screening - Comments:: Denies hearing difficulties   Immunizations and Health Maintenance Health Maintenance  Topic Date Due   DTaP/Tdap/Td (3 - Tdap) 08/31/2019   Diabetic kidney evaluation - Urine ACR  04/03/2024   Mammogram  04/18/2024   HEMOGLOBIN A1C  04/17/2024   OPHTHALMOLOGY EXAM  11/13/2024   Diabetic kidney evaluation - eGFR measurement  02/10/2025   FOOT EXAM  03/13/2025   Medicare Annual Wellness (AWV)  03/20/2025   Colonoscopy  11/28/2026   Pneumococcal Vaccine: 50+ Years  Completed   Influenza Vaccine  Completed   Bone Density Scan  Completed   Hepatitis C Screening  Completed   Meningococcal B Vaccine  Aged Out   COVID-19 Vaccine  Discontinued   Zoster Vaccines-  Shingrix   Discontinued        Assessment/Plan:  This is a routine wellness examination for Bettyann.  Patient Care Team: Norleen Lynwood ORN, MD as PCP - General (Internal Medicine) Court Dorn PARAS, MD as PCP - Cardiology (Cardiology) Court Dorn PARAS, MD as Consulting Physician (Cardiology) Debrah Lamar BIRCH, MD (Inactive) as Consulting Physician (Gastroenterology) Kay Kemps, MD as Consulting Physician (Orthopedic Surgery) Burnetta Aures, MD as Consulting Physician (Orthopedic Surgery)  I have personally reviewed and noted the following in the patients chart:   Medical and social history Use of alcohol, tobacco or illicit drugs  Current medications and supplements including opioid prescriptions. Functional ability and status Nutritional status Physical activity Advanced directives List of other physicians Hospitalizations, surgeries, and ER visits in previous 12 months Vitals Screenings to include cognitive, depression, and falls Referrals and appointments  No orders of the defined types were placed in this encounter.  In addition, I have reviewed and discussed with patient certain preventive protocols, quality metrics, and best practice recommendations. A written personalized care plan for preventive services as well as general preventive health recommendations were provided to patient.   Tea Collums L Demetrius Mahler, CMA   03/20/2024   Return in 1 year (on 03/20/2025).  After Visit Summary: (MyChart) Due to this being a telephonic visit, the after visit summary with patients personalized plan was offered to patient via MyChart   Nurse Notes: Patient is due for a Tdap and a UACR, which will be done during her next office visit wit new provider.   "

## 2024-03-20 NOTE — Patient Instructions (Signed)
 Heather Dawson,  Thank you for taking the time for your Medicare Wellness Visit. I appreciate your continued commitment to your health goals. Please review the care plan we discussed, and feel free to reach out if I can assist you further.  Please note that Annual Wellness Visits do not include a physical exam. Some assessments may be limited, especially if the visit was conducted virtually. If needed, we may recommend an in-person follow-up with your provider.  Ongoing Care Seeing your primary care provider every 3 to 6 months helps us  monitor your health and provide consistent, personalized care. Last office visit on 03/13/2024.  You are due for a tetanus vaccine and can get that done at your local pharmacy.  You are also due for a kidney evaluation (urine sample) this can be done during your next office visit with your new provider. Keep up the good work.  Referrals If a referral was made during today's visit and you haven't received any updates within two weeks, please contact the referred provider directly to check on the status.  Recommended Screenings:  Health Maintenance  Topic Date Due   DTaP/Tdap/Td vaccine (3 - Tdap) 08/31/2019   Medicare Annual Wellness Visit  03/10/2024   Kidney health urinalysis for diabetes  04/03/2024   Breast Cancer Screening  04/18/2024   Hemoglobin A1C  04/17/2024   Eye exam for diabetics  11/13/2024   Yearly kidney function blood test for diabetes  02/10/2025   Complete foot exam   03/13/2025   Colon Cancer Screening  11/28/2026   Pneumococcal Vaccine for age over 66  Completed   Flu Shot  Completed   Osteoporosis screening with Bone Density Scan  Completed   Hepatitis C Screening  Completed   Meningitis B Vaccine  Aged Out   COVID-19 Vaccine  Discontinued   Zoster (Shingles) Vaccine  Discontinued       03/16/2024    2:19 PM  Advanced Directives  Does Patient Have a Medical Advance Directive? Yes  Type of Estate Agent of  [place];Living will  Copy of Healthcare Power of Attorney in Chart? No - copy requested    Vision: Annual vision screenings are recommended for early detection of glaucoma, cataracts, and diabetic retinopathy. These exams can also reveal signs of chronic conditions such as diabetes and high blood pressure.  Dental: Annual dental screenings help detect early signs of oral cancer, gum disease, and other conditions linked to overall health, including heart disease and diabetes.  Please see the attached documents for additional preventive care recommendations.

## 2024-04-17 ENCOUNTER — Ambulatory Visit: Admitting: Internal Medicine

## 2024-06-23 ENCOUNTER — Encounter: Admitting: Family Medicine
